# Patient Record
Sex: Male | Born: 1935 | Race: White | Hispanic: No | Smoking: Never smoker
Health system: Southern US, Community
[De-identification: ages and names within clinical notes are randomized; demographics above are authoritative.]

## PROBLEM LIST (undated history)

## (undated) DIAGNOSIS — R001 Bradycardia, unspecified: Secondary | ICD-10-CM

## (undated) DIAGNOSIS — I10 Essential (primary) hypertension: Secondary | ICD-10-CM

## (undated) DIAGNOSIS — Z9889 Other specified postprocedural states: Secondary | ICD-10-CM

## (undated) DIAGNOSIS — K279 Peptic ulcer, site unspecified, unspecified as acute or chronic, without hemorrhage or perforation: Secondary | ICD-10-CM

## (undated) DIAGNOSIS — M1990 Unspecified osteoarthritis, unspecified site: Secondary | ICD-10-CM

## (undated) DIAGNOSIS — I509 Heart failure, unspecified: Secondary | ICD-10-CM

## (undated) DIAGNOSIS — N400 Enlarged prostate without lower urinary tract symptoms: Secondary | ICD-10-CM

## (undated) DIAGNOSIS — E669 Obesity, unspecified: Secondary | ICD-10-CM

## (undated) DIAGNOSIS — D649 Anemia, unspecified: Secondary | ICD-10-CM

## (undated) DIAGNOSIS — E119 Type 2 diabetes mellitus without complications: Secondary | ICD-10-CM

## (undated) DIAGNOSIS — K219 Gastro-esophageal reflux disease without esophagitis: Secondary | ICD-10-CM

## (undated) DIAGNOSIS — Z95810 Presence of automatic (implantable) cardiac defibrillator: Secondary | ICD-10-CM

## (undated) DIAGNOSIS — I4891 Unspecified atrial fibrillation: Secondary | ICD-10-CM

## (undated) DIAGNOSIS — E785 Hyperlipidemia, unspecified: Secondary | ICD-10-CM

## (undated) HISTORY — DX: Anemia, unspecified: D64.9

## (undated) HISTORY — DX: Other specified postprocedural states: Z98.890

## (undated) HISTORY — DX: Obesity, unspecified: E66.9

## (undated) HISTORY — DX: Heart failure, unspecified: I50.9

## (undated) HISTORY — DX: Bradycardia, unspecified: R00.1

## (undated) HISTORY — DX: Unspecified atrial fibrillation: I48.91

## (undated) HISTORY — PX: EP IMPLANTABLE DEVICE: SHX172B

## (undated) HISTORY — DX: Type 2 diabetes mellitus without complications: E11.9

## (undated) HISTORY — DX: Essential (primary) hypertension: I10

## (undated) HISTORY — DX: Unspecified osteoarthritis, unspecified site: M19.90

## (undated) HISTORY — DX: Peptic ulcer, site unspecified, unspecified as acute or chronic, without hemorrhage or perforation: K27.9

## (undated) HISTORY — DX: Benign prostatic hyperplasia without lower urinary tract symptoms: N40.0

## (undated) HISTORY — DX: Hyperlipidemia, unspecified: E78.5

---

## 1963-12-11 HISTORY — PX: OTHER SURGICAL HISTORY: SHX169

## 1995-12-11 HISTORY — PX: TOTAL KNEE ARTHROPLASTY: SHX125

## 2000-02-08 HISTORY — PX: OTHER SURGICAL HISTORY: SHX169

## 2000-02-14 ENCOUNTER — Encounter: Payer: Self-pay | Admitting: Orthopedic Surgery

## 2000-02-21 ENCOUNTER — Inpatient Hospital Stay (HOSPITAL_COMMUNITY): Admission: RE | Admit: 2000-02-21 | Discharge: 2000-02-28 | Payer: Self-pay | Admitting: Orthopedic Surgery

## 2000-02-24 ENCOUNTER — Encounter: Payer: Self-pay | Admitting: Specialist

## 2002-06-24 HISTORY — PX: ESOPHAGOGASTRODUODENOSCOPY: SHX1529

## 2004-10-10 ENCOUNTER — Ambulatory Visit: Payer: Self-pay | Admitting: Cardiology

## 2004-10-13 ENCOUNTER — Ambulatory Visit: Payer: Self-pay | Admitting: Cardiology

## 2004-10-18 ENCOUNTER — Ambulatory Visit: Payer: Self-pay | Admitting: Gastroenterology

## 2004-10-20 ENCOUNTER — Ambulatory Visit: Payer: Self-pay

## 2004-11-16 ENCOUNTER — Ambulatory Visit: Payer: Self-pay | Admitting: Endocrinology

## 2004-12-07 ENCOUNTER — Ambulatory Visit: Payer: Self-pay | Admitting: Cardiology

## 2004-12-12 ENCOUNTER — Ambulatory Visit: Payer: Self-pay | Admitting: Internal Medicine

## 2004-12-12 ENCOUNTER — Inpatient Hospital Stay (HOSPITAL_BASED_OUTPATIENT_CLINIC_OR_DEPARTMENT_OTHER): Admission: RE | Admit: 2004-12-12 | Discharge: 2004-12-12 | Payer: Self-pay | Admitting: Internal Medicine

## 2004-12-15 ENCOUNTER — Ambulatory Visit: Payer: Self-pay

## 2004-12-21 ENCOUNTER — Ambulatory Visit: Payer: Self-pay | Admitting: Internal Medicine

## 2005-01-04 ENCOUNTER — Ambulatory Visit: Payer: Self-pay | Admitting: Internal Medicine

## 2005-01-12 ENCOUNTER — Ambulatory Visit: Payer: Self-pay

## 2005-01-18 ENCOUNTER — Ambulatory Visit: Payer: Self-pay | Admitting: Cardiology

## 2005-03-05 ENCOUNTER — Ambulatory Visit: Payer: Self-pay | Admitting: Cardiology

## 2005-10-03 ENCOUNTER — Ambulatory Visit: Payer: Self-pay | Admitting: Endocrinology

## 2005-10-19 ENCOUNTER — Ambulatory Visit: Payer: Self-pay | Admitting: Endocrinology

## 2005-10-25 ENCOUNTER — Ambulatory Visit: Payer: Self-pay | Admitting: Endocrinology

## 2006-10-24 ENCOUNTER — Ambulatory Visit: Payer: Self-pay | Admitting: Internal Medicine

## 2007-01-16 ENCOUNTER — Ambulatory Visit: Payer: Self-pay | Admitting: Endocrinology

## 2007-01-16 LAB — CONVERTED CEMR LAB
ALT: 26 units/L (ref 0–40)
AST: 28 units/L (ref 0–37)
Albumin: 3.8 g/dL (ref 3.5–5.2)
Alkaline Phosphatase: 58 units/L (ref 39–117)
BUN: 25 mg/dL — ABNORMAL HIGH (ref 6–23)
Basophils Absolute: 0 10*3/uL (ref 0.0–0.1)
Basophils Relative: 0.3 % (ref 0.0–1.0)
Bilirubin Urine: NEGATIVE
Bilirubin, Direct: 0.2 mg/dL (ref 0.0–0.3)
CO2: 30 meq/L (ref 19–32)
Calcium: 9.2 mg/dL (ref 8.4–10.5)
Chloride: 102 meq/L (ref 96–112)
Cholesterol: 123 mg/dL (ref 0–200)
Creatinine, Ser: 1 mg/dL (ref 0.4–1.5)
Creatinine,U: 127.9 mg/dL
Crystals: NEGATIVE
Eosinophils Absolute: 0.2 10*3/uL (ref 0.0–0.6)
Eosinophils Relative: 3.6 % (ref 0.0–5.0)
GFR calc Af Amer: 94 mL/min
GFR calc non Af Amer: 77 mL/min
Glucose, Bld: 218 mg/dL — ABNORMAL HIGH (ref 70–99)
HCT: 39.8 % (ref 39.0–52.0)
HDL: 57.5 mg/dL (ref >39.0)
Hemoglobin, Urine: NEGATIVE
Hemoglobin: 13.7 g/dL (ref 13.0–17.0)
Hgb A1c MFr Bld: 6.3 % — ABNORMAL HIGH (ref 4.6–6.0)
Ketones, ur: NEGATIVE mg/dL
LDL Cholesterol: 55 mg/dL (ref 0–99)
Leukocytes, UA: NEGATIVE
Lymphocytes Relative: 23.6 % (ref 12.0–46.0)
MCHC: 34.5 g/dL (ref 30.0–36.0)
MCV: 88.3 fL (ref 78.0–100.0)
Microalb Creat Ratio: 5.5 mg/g (ref 0.0–30.0)
Microalb, Ur: 0.7 mg/dL (ref 0.0–1.9)
Monocytes Absolute: 0.5 10*3/uL (ref 0.2–0.7)
Monocytes Relative: 8.1 % (ref 3.0–11.0)
Neutro Abs: 4.1 10*3/uL (ref 1.4–7.7)
Neutrophils Relative %: 64.4 % (ref 43.0–77.0)
Nitrite: NEGATIVE
PSA: 1.12 ng/mL (ref 0.10–4.00)
Platelets: 176 10*3/uL (ref 150–400)
Potassium: 2.7 meq/L — CL (ref 3.5–5.1)
RBC: 4.51 M/uL (ref 4.22–5.81)
RDW: 13.8 % (ref 11.5–14.6)
Sodium: 138 meq/L (ref 135–145)
Specific Gravity, Urine: 1.015 (ref 1.000–1.03)
Squamous Epithelial / HPF: NEGATIVE /lpf
TSH: 1.48 microintl units/mL (ref 0.35–5.50)
Total Bilirubin: 0.6 mg/dL (ref 0.3–1.2)
Total CHOL/HDL Ratio: 2.1
Total Protein, Urine: NEGATIVE mg/dL
Total Protein: 6.5 g/dL (ref 6.0–8.3)
Triglycerides: 53 mg/dL (ref 0–149)
Urine Glucose: NEGATIVE mg/dL
Urobilinogen, UA: 0.2 (ref 0.0–1.0)
VLDL: 11 mg/dL (ref 0–40)
WBC: 6.3 10*3/uL (ref 4.5–10.5)
pH: 6.5 (ref 5.0–8.0)

## 2007-02-14 ENCOUNTER — Ambulatory Visit: Payer: Self-pay | Admitting: Endocrinology

## 2007-02-14 LAB — CONVERTED CEMR LAB
BUN: 26 mg/dL — ABNORMAL HIGH (ref 6–23)
CO2: 31 meq/L (ref 19–32)
Calcium: 9.4 mg/dL (ref 8.4–10.5)
Chloride: 98 meq/L (ref 96–112)
Creatinine, Ser: 0.9 mg/dL (ref 0.4–1.5)
Creatinine,U: 100.2 mg/dL
GFR calc Af Amer: 106 mL/min
GFR calc non Af Amer: 87 mL/min
Glucose, Bld: 120 mg/dL — ABNORMAL HIGH (ref 70–99)
Hgb A1c MFr Bld: 6.6 % — ABNORMAL HIGH (ref 4.6–6.0)
Microalb Creat Ratio: 4 mg/g (ref 0.0–30.0)
Microalb, Ur: 0.4 mg/dL (ref 0.0–1.9)
Potassium: 3.2 meq/L — ABNORMAL LOW (ref 3.5–5.1)
Sodium: 137 meq/L (ref 135–145)

## 2007-05-19 ENCOUNTER — Ambulatory Visit: Payer: Self-pay | Admitting: Endocrinology

## 2007-05-19 LAB — CONVERTED CEMR LAB
BUN: 26 mg/dL — ABNORMAL HIGH (ref 6–23)
CO2: 31 meq/L (ref 19–32)
Calcium: 9 mg/dL (ref 8.4–10.5)
Chloride: 106 meq/L (ref 96–112)
Creatinine, Ser: 0.9 mg/dL (ref 0.4–1.5)
GFR calc Af Amer: 106 mL/min
GFR calc non Af Amer: 87 mL/min
Glucose, Bld: 138 mg/dL — ABNORMAL HIGH (ref 70–99)
Hgb A1c MFr Bld: 6.6 % — ABNORMAL HIGH (ref 4.6–6.0)
Potassium: 3.2 meq/L — ABNORMAL LOW (ref 3.5–5.1)
Sodium: 143 meq/L (ref 135–145)

## 2007-08-09 ENCOUNTER — Encounter: Payer: Self-pay | Admitting: Endocrinology

## 2007-08-09 DIAGNOSIS — I10 Essential (primary) hypertension: Secondary | ICD-10-CM | POA: Insufficient documentation

## 2007-08-09 DIAGNOSIS — I509 Heart failure, unspecified: Secondary | ICD-10-CM | POA: Insufficient documentation

## 2007-08-09 DIAGNOSIS — D509 Iron deficiency anemia, unspecified: Secondary | ICD-10-CM

## 2007-08-09 DIAGNOSIS — I252 Old myocardial infarction: Secondary | ICD-10-CM | POA: Insufficient documentation

## 2007-09-18 ENCOUNTER — Ambulatory Visit: Payer: Self-pay | Admitting: Endocrinology

## 2007-09-18 LAB — CONVERTED CEMR LAB
BUN: 19 mg/dL (ref 6–23)
CO2: 34 meq/L — ABNORMAL HIGH (ref 19–32)
Calcium: 9.6 mg/dL (ref 8.4–10.5)
Chloride: 100 meq/L (ref 96–112)
Creatinine, Ser: 1 mg/dL (ref 0.4–1.5)
GFR calc Af Amer: 93 mL/min
GFR calc non Af Amer: 77 mL/min
Glucose, Bld: 96 mg/dL (ref 70–99)
Hgb A1c MFr Bld: 6.3 % — ABNORMAL HIGH (ref 4.6–6.0)
Potassium: 3.7 meq/L (ref 3.5–5.1)
Sodium: 140 meq/L (ref 135–145)

## 2008-03-09 ENCOUNTER — Encounter: Payer: Self-pay | Admitting: Endocrinology

## 2008-03-16 ENCOUNTER — Ambulatory Visit: Payer: Self-pay | Admitting: Endocrinology

## 2008-03-16 DIAGNOSIS — E1121 Type 2 diabetes mellitus with diabetic nephropathy: Secondary | ICD-10-CM | POA: Insufficient documentation

## 2008-03-16 DIAGNOSIS — I4891 Unspecified atrial fibrillation: Secondary | ICD-10-CM | POA: Insufficient documentation

## 2008-04-06 ENCOUNTER — Ambulatory Visit: Payer: Self-pay

## 2008-04-06 ENCOUNTER — Ambulatory Visit: Payer: Self-pay | Admitting: Cardiology

## 2008-04-13 ENCOUNTER — Encounter: Payer: Self-pay | Admitting: Cardiology

## 2008-04-13 ENCOUNTER — Ambulatory Visit: Payer: Self-pay | Admitting: Cardiology

## 2008-04-13 ENCOUNTER — Ambulatory Visit: Payer: Self-pay

## 2008-04-20 ENCOUNTER — Ambulatory Visit: Payer: Self-pay | Admitting: Cardiovascular Disease

## 2008-04-28 ENCOUNTER — Ambulatory Visit: Payer: Self-pay | Admitting: Cardiology

## 2008-05-04 ENCOUNTER — Encounter: Payer: Self-pay | Admitting: Endocrinology

## 2008-05-04 ENCOUNTER — Ambulatory Visit: Payer: Self-pay

## 2008-05-05 ENCOUNTER — Ambulatory Visit: Payer: Self-pay | Admitting: Cardiology

## 2008-05-17 ENCOUNTER — Ambulatory Visit: Payer: Self-pay | Admitting: Cardiology

## 2008-05-20 ENCOUNTER — Ambulatory Visit: Payer: Self-pay | Admitting: Cardiology

## 2008-06-10 ENCOUNTER — Ambulatory Visit: Payer: Self-pay | Admitting: Cardiology

## 2008-06-17 ENCOUNTER — Ambulatory Visit: Payer: Self-pay | Admitting: Cardiology

## 2008-07-01 ENCOUNTER — Ambulatory Visit: Payer: Self-pay | Admitting: Internal Medicine

## 2008-07-01 ENCOUNTER — Telehealth: Payer: Self-pay | Admitting: Endocrinology

## 2008-07-29 ENCOUNTER — Ambulatory Visit: Payer: Self-pay | Admitting: Internal Medicine

## 2008-08-11 ENCOUNTER — Ambulatory Visit: Payer: Self-pay | Admitting: Cardiology

## 2008-08-11 LAB — CONVERTED CEMR LAB
BUN: 35 mg/dL — ABNORMAL HIGH (ref 6–23)
CO2: 33 meq/L — ABNORMAL HIGH (ref 19–32)
Calcium: 9 mg/dL (ref 8.4–10.5)
Chloride: 104 meq/L (ref 96–112)
Creatinine, Ser: 1.1 mg/dL (ref 0.4–1.5)
GFR calc Af Amer: 83 mL/min
GFR calc non Af Amer: 69 mL/min
Glucose, Bld: 94 mg/dL (ref 70–99)
Potassium: 3.1 meq/L — ABNORMAL LOW (ref 3.5–5.1)
Pro B Natriuretic peptide (BNP): 62 pg/mL (ref 0.0–100.0)
Sodium: 140 meq/L (ref 135–145)

## 2008-08-19 ENCOUNTER — Ambulatory Visit: Payer: Self-pay | Admitting: Cardiology

## 2008-08-19 LAB — CONVERTED CEMR LAB
BUN: 21 mg/dL (ref 6–23)
CO2: 29 meq/L (ref 19–32)
Calcium: 9.2 mg/dL (ref 8.4–10.5)
Chloride: 103 meq/L (ref 96–112)
Creatinine, Ser: 0.8 mg/dL (ref 0.4–1.5)
GFR calc Af Amer: 121 mL/min
GFR calc non Af Amer: 100 mL/min
Glucose, Bld: 103 mg/dL — ABNORMAL HIGH (ref 70–99)
Potassium: 3.7 meq/L (ref 3.5–5.1)
Sodium: 139 meq/L (ref 135–145)

## 2008-08-26 ENCOUNTER — Ambulatory Visit: Payer: Self-pay | Admitting: Cardiology

## 2008-09-08 ENCOUNTER — Ambulatory Visit: Payer: Self-pay | Admitting: Endocrinology

## 2008-09-23 ENCOUNTER — Ambulatory Visit: Payer: Self-pay | Admitting: Cardiology

## 2008-10-14 ENCOUNTER — Telehealth: Payer: Self-pay | Admitting: Endocrinology

## 2008-10-14 ENCOUNTER — Ambulatory Visit: Payer: Self-pay | Admitting: Cardiovascular Disease

## 2008-11-11 ENCOUNTER — Ambulatory Visit: Payer: Self-pay | Admitting: Internal Medicine

## 2008-12-06 ENCOUNTER — Ambulatory Visit: Payer: Self-pay | Admitting: Cardiovascular Disease

## 2008-12-31 ENCOUNTER — Ambulatory Visit: Payer: Self-pay | Admitting: Internal Medicine

## 2009-01-28 ENCOUNTER — Ambulatory Visit: Payer: Self-pay | Admitting: Cardiovascular Disease

## 2009-02-08 ENCOUNTER — Telehealth: Payer: Self-pay | Admitting: Endocrinology

## 2009-02-21 DIAGNOSIS — E785 Hyperlipidemia, unspecified: Secondary | ICD-10-CM | POA: Insufficient documentation

## 2009-02-21 DIAGNOSIS — K275 Chronic or unspecified peptic ulcer, site unspecified, with perforation: Secondary | ICD-10-CM

## 2009-02-21 DIAGNOSIS — I498 Other specified cardiac arrhythmias: Secondary | ICD-10-CM

## 2009-02-21 DIAGNOSIS — R609 Edema, unspecified: Secondary | ICD-10-CM

## 2009-02-22 ENCOUNTER — Ambulatory Visit: Payer: Self-pay | Admitting: Cardiology

## 2009-02-22 ENCOUNTER — Encounter: Payer: Self-pay | Admitting: Cardiology

## 2009-02-22 LAB — CONVERTED CEMR LAB
BUN: 36 mg/dL — ABNORMAL HIGH (ref 6–23)
CO2: 32 meq/L (ref 19–32)
Calcium: 9.2 mg/dL (ref 8.4–10.5)
Chloride: 106 meq/L (ref 96–112)
Creatinine, Ser: 0.9 mg/dL (ref 0.4–1.5)
GFR calc non Af Amer: 86.74 mL/min (ref >60)
Glucose, Bld: 113 mg/dL — ABNORMAL HIGH (ref 70–99)
Potassium: 4.7 meq/L (ref 3.5–5.1)
Sodium: 142 meq/L (ref 135–145)

## 2009-02-25 ENCOUNTER — Ambulatory Visit: Payer: Self-pay | Admitting: Cardiology

## 2009-02-28 ENCOUNTER — Encounter: Payer: Self-pay | Admitting: Endocrinology

## 2009-03-09 ENCOUNTER — Ambulatory Visit: Payer: Self-pay | Admitting: Cardiology

## 2009-03-09 LAB — CONVERTED CEMR LAB
BUN: 29 mg/dL — ABNORMAL HIGH (ref 6–23)
Basophils Absolute: 0 10*3/uL (ref 0.0–0.1)
Basophils Relative: 0.5 % (ref 0.0–3.0)
CO2: 29 meq/L (ref 19–32)
Calcium: 8.9 mg/dL (ref 8.4–10.5)
Chloride: 105 meq/L (ref 96–112)
Creatinine, Ser: 0.9 mg/dL (ref 0.4–1.5)
Eosinophils Absolute: 0.4 10*3/uL (ref 0.0–0.7)
Eosinophils Relative: 5.5 % — ABNORMAL HIGH (ref 0.0–5.0)
GFR calc non Af Amer: 86.73 mL/min (ref >60)
Glucose, Bld: 90 mg/dL (ref 70–99)
HCT: 39.3 % (ref 39.0–52.0)
Hemoglobin: 13 g/dL (ref 13.0–17.0)
Lymphocytes Relative: 26.7 % (ref 12.0–46.0)
Lymphs Abs: 1.7 10*3/uL (ref 0.7–4.0)
MCHC: 33 g/dL (ref 30.0–36.0)
MCV: 92.7 fL (ref 78.0–100.0)
Monocytes Absolute: 0.8 10*3/uL (ref 0.1–1.0)
Monocytes Relative: 11.9 % (ref 3.0–12.0)
Neutro Abs: 3.6 10*3/uL (ref 1.4–7.7)
Neutrophils Relative %: 55.4 % (ref 43.0–77.0)
Platelets: 121 10*3/uL — ABNORMAL LOW (ref 150.0–400.0)
Potassium: 3.4 meq/L — ABNORMAL LOW (ref 3.5–5.1)
RBC: 4.24 M/uL (ref 4.22–5.81)
RDW: 13.2 % (ref 11.5–14.6)
Sodium: 141 meq/L (ref 135–145)
WBC: 6.5 10*3/uL (ref 4.5–10.5)

## 2009-03-24 ENCOUNTER — Ambulatory Visit: Payer: Self-pay | Admitting: Internal Medicine

## 2009-03-24 ENCOUNTER — Encounter: Payer: Self-pay | Admitting: Endocrinology

## 2009-03-31 ENCOUNTER — Ambulatory Visit: Payer: Self-pay | Admitting: Cardiology

## 2009-03-31 ENCOUNTER — Encounter: Payer: Self-pay | Admitting: Cardiology

## 2009-03-31 ENCOUNTER — Encounter: Payer: Self-pay | Admitting: Endocrinology

## 2009-03-31 ENCOUNTER — Ambulatory Visit: Payer: Self-pay | Admitting: Internal Medicine

## 2009-03-31 ENCOUNTER — Ambulatory Visit: Payer: Self-pay | Admitting: Cardiovascular Disease

## 2009-03-31 LAB — CONVERTED CEMR LAB
Calcium: 9.5 mg/dL (ref 8.4–10.5)
GFR calc non Af Amer: 76.79 mL/min (ref >60)
Potassium: 3.4 meq/L — ABNORMAL LOW (ref 3.5–5.1)
Sodium: 140 meq/L (ref 135–145)

## 2009-04-07 ENCOUNTER — Ambulatory Visit: Payer: Self-pay | Admitting: Internal Medicine

## 2009-04-07 ENCOUNTER — Ambulatory Visit: Payer: Self-pay | Admitting: Cardiology

## 2009-04-07 ENCOUNTER — Encounter: Payer: Self-pay | Admitting: Endocrinology

## 2009-04-08 LAB — CONVERTED CEMR LAB
CO2: 30 meq/L (ref 19–32)
Chloride: 106 meq/L (ref 96–112)
Potassium: 4.5 meq/L (ref 3.5–5.1)
Sodium: 141 meq/L (ref 135–145)

## 2009-04-15 ENCOUNTER — Ambulatory Visit: Payer: Self-pay

## 2009-04-15 ENCOUNTER — Encounter: Payer: Self-pay | Admitting: Cardiology

## 2009-04-21 ENCOUNTER — Encounter: Payer: Self-pay | Admitting: Endocrinology

## 2009-04-21 ENCOUNTER — Ambulatory Visit: Payer: Self-pay | Admitting: Internal Medicine

## 2009-04-29 ENCOUNTER — Ambulatory Visit: Payer: Self-pay | Admitting: Cardiology

## 2009-04-29 DIAGNOSIS — I428 Other cardiomyopathies: Secondary | ICD-10-CM

## 2009-05-02 LAB — CONVERTED CEMR LAB
Basophils Relative: 0.3 % (ref 0.0–3.0)
Eosinophils Absolute: 0.3 10*3/uL (ref 0.0–0.7)
Eosinophils Relative: 5 % (ref 0.0–5.0)
Hemoglobin: 12.4 g/dL — ABNORMAL LOW (ref 13.0–17.0)
MCHC: 33.2 g/dL (ref 30.0–36.0)
MCV: 92.6 fL (ref 78.0–100.0)
Monocytes Absolute: 0.6 10*3/uL (ref 0.1–1.0)
Neutro Abs: 3.1 10*3/uL (ref 1.4–7.7)
Neutrophils Relative %: 55.1 % (ref 43.0–77.0)
RBC: 4.03 M/uL — ABNORMAL LOW (ref 4.22–5.81)
WBC: 5.6 10*3/uL (ref 4.5–10.5)

## 2009-05-11 ENCOUNTER — Encounter: Payer: Self-pay | Admitting: *Deleted

## 2009-05-25 ENCOUNTER — Encounter: Payer: Self-pay | Admitting: Endocrinology

## 2009-05-25 ENCOUNTER — Ambulatory Visit: Payer: Self-pay | Admitting: Internal Medicine

## 2009-05-26 ENCOUNTER — Encounter: Payer: Self-pay | Admitting: Internal Medicine

## 2009-06-14 ENCOUNTER — Telehealth: Payer: Self-pay | Admitting: Endocrinology

## 2009-06-15 ENCOUNTER — Encounter: Payer: Self-pay | Admitting: *Deleted

## 2009-06-20 ENCOUNTER — Ambulatory Visit: Payer: Self-pay | Admitting: Endocrinology

## 2009-06-20 ENCOUNTER — Telehealth: Payer: Self-pay | Admitting: Endocrinology

## 2009-06-20 DIAGNOSIS — E790 Hyperuricemia without signs of inflammatory arthritis and tophaceous disease: Secondary | ICD-10-CM | POA: Insufficient documentation

## 2009-06-20 DIAGNOSIS — D696 Thrombocytopenia, unspecified: Secondary | ICD-10-CM

## 2009-06-21 LAB — CONVERTED CEMR LAB
Basophils Relative: 1.1 % (ref 0.0–3.0)
Bilirubin Urine: NEGATIVE
CO2: 33 meq/L — ABNORMAL HIGH (ref 19–32)
Calcium: 9 mg/dL (ref 8.4–10.5)
Chloride: 103 meq/L (ref 96–112)
Creatinine, Ser: 0.8 mg/dL (ref 0.4–1.5)
Eosinophils Relative: 3.7 % (ref 0.0–5.0)
Folate: 8.6 ng/mL
HDL: 57.4 mg/dL (ref >39.00)
Hemoglobin, Urine: NEGATIVE
Hemoglobin: 13.7 g/dL (ref 13.0–17.0)
Iron: 58 ug/dL (ref 42–165)
Ketones, ur: NEGATIVE mg/dL
LDL Cholesterol: 57 mg/dL (ref 0–99)
Lymphocytes Relative: 26.7 % (ref 12.0–46.0)
Monocytes Relative: 11 % (ref 3.0–12.0)
Neutro Abs: 3.9 10*3/uL (ref 1.4–7.7)
Neutrophils Relative %: 57.5 % (ref 43.0–77.0)
PSA: 1.12 ng/mL (ref 0.10–4.00)
RBC: 4.32 M/uL (ref 4.22–5.81)
Saturation Ratios: 19.7 % — ABNORMAL LOW (ref 20.0–50.0)
Sodium: 143 meq/L (ref 135–145)
Specific Gravity, Urine: 1.015 (ref 1.000–1.030)
Total CHOL/HDL Ratio: 2
Transferrin: 210.8 mg/dL — ABNORMAL LOW (ref 212.0–360.0)
Triglycerides: 68 mg/dL (ref 0.0–149.0)
Urine Glucose: NEGATIVE mg/dL
Urobilinogen, UA: 0.2 (ref 0.0–1.0)
Vitamin B-12: 301 pg/mL (ref 211–911)
WBC: 6.7 10*3/uL (ref 4.5–10.5)

## 2009-06-22 ENCOUNTER — Encounter: Payer: Self-pay | Admitting: Endocrinology

## 2009-06-22 ENCOUNTER — Ambulatory Visit: Payer: Self-pay | Admitting: Internal Medicine

## 2009-07-26 ENCOUNTER — Encounter: Payer: Self-pay | Admitting: Endocrinology

## 2009-07-26 ENCOUNTER — Ambulatory Visit: Payer: Self-pay | Admitting: Cardiology

## 2009-07-26 ENCOUNTER — Ambulatory Visit: Payer: Self-pay | Admitting: Internal Medicine

## 2009-08-16 ENCOUNTER — Encounter: Payer: Self-pay | Admitting: Cardiology

## 2009-08-16 ENCOUNTER — Encounter: Payer: Self-pay | Admitting: Endocrinology

## 2009-08-17 ENCOUNTER — Ambulatory Visit: Payer: Self-pay | Admitting: Internal Medicine

## 2009-09-14 ENCOUNTER — Ambulatory Visit: Payer: Self-pay | Admitting: Internal Medicine

## 2009-09-14 ENCOUNTER — Encounter: Payer: Self-pay | Admitting: Cardiology

## 2009-10-12 ENCOUNTER — Encounter: Payer: Self-pay | Admitting: Cardiology

## 2009-10-19 ENCOUNTER — Ambulatory Visit: Payer: Self-pay | Admitting: Internal Medicine

## 2009-11-01 ENCOUNTER — Encounter (INDEPENDENT_AMBULATORY_CARE_PROVIDER_SITE_OTHER): Payer: Self-pay | Admitting: *Deleted

## 2009-11-14 ENCOUNTER — Encounter: Payer: Self-pay | Admitting: Cardiology

## 2009-11-14 ENCOUNTER — Encounter: Payer: Self-pay | Admitting: Endocrinology

## 2009-11-14 ENCOUNTER — Ambulatory Visit: Payer: Self-pay | Admitting: Cardiology

## 2009-12-12 ENCOUNTER — Ambulatory Visit: Payer: Self-pay | Admitting: Internal Medicine

## 2009-12-12 ENCOUNTER — Encounter: Payer: Self-pay | Admitting: Cardiology

## 2009-12-27 ENCOUNTER — Ambulatory Visit: Payer: Self-pay | Admitting: Cardiology

## 2009-12-29 ENCOUNTER — Telehealth (INDEPENDENT_AMBULATORY_CARE_PROVIDER_SITE_OTHER): Payer: Self-pay | Admitting: *Deleted

## 2010-01-10 ENCOUNTER — Ambulatory Visit: Payer: Self-pay | Admitting: Internal Medicine

## 2010-01-10 ENCOUNTER — Ambulatory Visit: Payer: Self-pay | Admitting: Cardiology

## 2010-01-23 ENCOUNTER — Ambulatory Visit: Payer: Self-pay | Admitting: Cardiology

## 2010-02-09 ENCOUNTER — Encounter: Payer: Self-pay | Admitting: Cardiovascular Disease

## 2010-02-09 ENCOUNTER — Ambulatory Visit: Payer: Self-pay | Admitting: Internal Medicine

## 2010-02-23 ENCOUNTER — Encounter: Payer: Self-pay | Admitting: Cardiology

## 2010-02-23 ENCOUNTER — Encounter: Payer: Self-pay | Admitting: Endocrinology

## 2010-03-13 ENCOUNTER — Encounter: Payer: Self-pay | Admitting: Cardiology

## 2010-03-14 ENCOUNTER — Ambulatory Visit: Payer: Self-pay | Admitting: Internal Medicine

## 2010-03-16 ENCOUNTER — Encounter (INDEPENDENT_AMBULATORY_CARE_PROVIDER_SITE_OTHER): Payer: Self-pay | Admitting: *Deleted

## 2010-03-16 ENCOUNTER — Ambulatory Visit: Payer: Self-pay | Admitting: Cardiology

## 2010-03-16 LAB — CONVERTED CEMR LAB
ALT: 19 units/L (ref 0–53)
AST: 25 units/L (ref 0–37)
Albumin: 3.8 g/dL (ref 3.5–5.2)
HDL: 60 mg/dL (ref >39.00)
Total CHOL/HDL Ratio: 3
Triglycerides: 51 mg/dL (ref 0.0–149.0)
VLDL: 10.2 mg/dL (ref 0.0–40.0)

## 2010-04-07 ENCOUNTER — Telehealth: Payer: Self-pay | Admitting: Endocrinology

## 2010-04-10 ENCOUNTER — Ambulatory Visit: Payer: Self-pay | Admitting: Internal Medicine

## 2010-05-10 ENCOUNTER — Ambulatory Visit: Payer: Self-pay | Admitting: Internal Medicine

## 2010-06-13 ENCOUNTER — Ambulatory Visit: Payer: Self-pay | Admitting: Internal Medicine

## 2010-06-13 ENCOUNTER — Encounter: Payer: Self-pay | Admitting: Cardiology

## 2010-07-11 ENCOUNTER — Ambulatory Visit: Payer: Self-pay | Admitting: Internal Medicine

## 2010-07-31 ENCOUNTER — Ambulatory Visit: Payer: Self-pay | Admitting: Endocrinology

## 2010-07-31 DIAGNOSIS — E876 Hypokalemia: Secondary | ICD-10-CM

## 2010-08-02 LAB — CONVERTED CEMR LAB
ALT: 18 units/L (ref 0–53)
BUN: 31 mg/dL — ABNORMAL HIGH (ref 6–23)
Basophils Absolute: 0 10*3/uL (ref 0.0–0.1)
Bilirubin Urine: NEGATIVE
Chloride: 92 meq/L — ABNORMAL LOW (ref 96–112)
Cholesterol: 191 mg/dL (ref 0–200)
Glucose, Bld: 150 mg/dL — ABNORMAL HIGH (ref 70–99)
HCT: 43.7 % (ref 39.0–52.0)
Hemoglobin, Urine: NEGATIVE
Lymphs Abs: 1.5 10*3/uL (ref 0.7–4.0)
MCHC: 34.5 g/dL (ref 30.0–36.0)
MCV: 92.1 fL (ref 78.0–100.0)
Monocytes Absolute: 0.9 10*3/uL (ref 0.1–1.0)
Nitrite: NEGATIVE
PSA: 1.22 ng/mL (ref 0.10–4.00)
Platelets: 144 10*3/uL — ABNORMAL LOW (ref 150.0–400.0)
Potassium: 2.2 meq/L — CL (ref 3.5–5.1)
RDW: 15.4 % — ABNORMAL HIGH (ref 11.5–14.6)
TSH: 1.89 microintl units/mL (ref 0.35–5.50)
Total Bilirubin: 1.3 mg/dL — ABNORMAL HIGH (ref 0.3–1.2)
Total Protein, Urine: NEGATIVE mg/dL
Uric Acid, Serum: 10 mg/dL — ABNORMAL HIGH (ref 4.0–7.8)
VLDL: 14.6 mg/dL (ref 0.0–40.0)

## 2010-08-07 ENCOUNTER — Ambulatory Visit: Payer: Self-pay | Admitting: Cardiology

## 2010-08-08 ENCOUNTER — Ambulatory Visit: Payer: Self-pay | Admitting: Cardiology

## 2010-08-08 ENCOUNTER — Ambulatory Visit: Payer: Self-pay | Admitting: Internal Medicine

## 2010-08-10 ENCOUNTER — Telehealth (INDEPENDENT_AMBULATORY_CARE_PROVIDER_SITE_OTHER): Payer: Self-pay | Admitting: *Deleted

## 2010-08-30 ENCOUNTER — Ambulatory Visit: Payer: Self-pay | Admitting: Endocrinology

## 2010-08-30 DIAGNOSIS — R413 Other amnesia: Secondary | ICD-10-CM | POA: Insufficient documentation

## 2010-08-30 LAB — CONVERTED CEMR LAB
BUN: 35 mg/dL — ABNORMAL HIGH (ref 6–23)
CO2: 29 meq/L (ref 19–32)
Calcium: 9.3 mg/dL (ref 8.4–10.5)
Creatinine, Ser: 0.9 mg/dL (ref 0.4–1.5)
Glucose, Bld: 111 mg/dL — ABNORMAL HIGH (ref 70–99)

## 2010-08-31 ENCOUNTER — Telehealth: Payer: Self-pay | Admitting: Endocrinology

## 2010-09-07 ENCOUNTER — Ambulatory Visit: Payer: Self-pay | Admitting: Internal Medicine

## 2010-09-07 ENCOUNTER — Encounter: Payer: Self-pay | Admitting: Cardiology

## 2010-10-09 ENCOUNTER — Ambulatory Visit: Payer: Self-pay | Admitting: Internal Medicine

## 2010-10-18 ENCOUNTER — Ambulatory Visit: Payer: Self-pay | Admitting: Endocrinology

## 2010-10-18 DIAGNOSIS — E1129 Type 2 diabetes mellitus with other diabetic kidney complication: Secondary | ICD-10-CM

## 2010-10-18 LAB — CONVERTED CEMR LAB
BUN: 23 mg/dL (ref 6–23)
Calcium: 9.4 mg/dL (ref 8.4–10.5)
Creatinine, Ser: 0.8 mg/dL (ref 0.4–1.5)
GFR calc non Af Amer: 98.94 mL/min (ref >60)
Potassium: 4.1 meq/L (ref 3.5–5.1)
Uric Acid, Serum: 6.7 mg/dL (ref 4.0–7.8)

## 2010-10-30 ENCOUNTER — Ambulatory Visit: Payer: Self-pay | Admitting: Internal Medicine

## 2010-11-06 ENCOUNTER — Ambulatory Visit: Payer: Self-pay | Admitting: Internal Medicine

## 2010-12-06 ENCOUNTER — Ambulatory Visit: Payer: Self-pay | Admitting: Internal Medicine

## 2010-12-06 ENCOUNTER — Encounter: Payer: Self-pay | Admitting: Cardiology

## 2010-12-21 ENCOUNTER — Ambulatory Visit
Admission: RE | Admit: 2010-12-21 | Discharge: 2010-12-21 | Payer: Self-pay | Source: Home / Self Care | Attending: Internal Medicine | Admitting: Internal Medicine

## 2011-01-07 LAB — CONVERTED CEMR LAB
ALT: 33 units/L (ref 0–53)
AST: 25 units/L (ref 0–37)
Albumin: 3.9 g/dL (ref 3.5–5.2)
Alkaline Phosphatase: 66 units/L (ref 39–117)
BUN: 16 mg/dL (ref 6–23)
BUN: 21 mg/dL (ref 6–23)
Basophils Absolute: 0 10*3/uL (ref 0.0–0.1)
Basophils Relative: 0.4 % (ref 0.0–1.0)
Bilirubin, Direct: 0.2 mg/dL (ref 0.0–0.3)
CO2: 30 meq/L (ref 19–32)
CO2: 32 meq/L (ref 19–32)
Calcium: 9.2 mg/dL (ref 8.4–10.5)
Calcium: 9.4 mg/dL (ref 8.4–10.5)
Chloride: 104 meq/L (ref 96–112)
Chloride: 99 meq/L (ref 96–112)
Cholesterol: 125 mg/dL (ref 0–200)
Creatinine, Ser: 0.8 mg/dL (ref 0.4–1.5)
Creatinine, Ser: 0.8 mg/dL (ref 0.4–1.5)
Creatinine,U: 29.9 mg/dL
Eosinophils Absolute: 0.1 10*3/uL (ref 0.0–0.7)
Eosinophils Relative: 1.1 % (ref 0.0–5.0)
Folate: 8.7 ng/mL
GFR calc Af Amer: 121 mL/min
GFR calc non Af Amer: 100 mL/min
Glucose, Bld: 104 mg/dL — ABNORMAL HIGH (ref 70–99)
HCT: 43.6 % (ref 39.0–52.0)
HDL: 67.6 mg/dL (ref >39.0)
Hemoglobin: 13.9 g/dL (ref 13.0–17.0)
Hgb A1c MFr Bld: 6.4 % — ABNORMAL HIGH (ref 4.6–6.0)
Iron: 52 ug/dL (ref 42–165)
LDL Cholesterol: 51 mg/dL (ref 0–99)
Lymphocytes Relative: 13.2 % (ref 12.0–46.0)
MCHC: 32 g/dL (ref 30.0–36.0)
MCV: 92 fL (ref 78.0–100.0)
Microalb Creat Ratio: 90.3 mg/g — ABNORMAL HIGH (ref 0.0–30.0)
Microalb, Ur: 2.7 mg/dL — ABNORMAL HIGH (ref 0.0–1.9)
Monocytes Absolute: 1.1 10*3/uL — ABNORMAL HIGH (ref 0.1–1.0)
Monocytes Relative: 11.3 % (ref 3.0–12.0)
Neutro Abs: 7.2 10*3/uL (ref 1.4–7.7)
Neutrophils Relative %: 74 % (ref 43.0–77.0)
PSA: 0.9 ng/mL (ref 0.10–4.00)
Platelets: 164 10*3/uL (ref 150–400)
Potassium: 3.5 meq/L (ref 3.5–5.1)
Potassium: 4.1 meq/L (ref 3.5–5.1)
RBC: 4.74 M/uL (ref 4.22–5.81)
RDW: 14.2 % (ref 11.5–14.6)
Saturation Ratios: 15.9 % — ABNORMAL LOW (ref 20.0–50.0)
Sodium: 140 meq/L (ref 135–145)
Sodium: 141 meq/L (ref 135–145)
TSH: 1.44 microintl units/mL (ref 0.35–5.50)
Total Bilirubin: 0.9 mg/dL (ref 0.3–1.2)
Total CHOL/HDL Ratio: 1.8
Total CK: 129 units/L (ref 7–232)
Total CK: 158 units/L (ref 7–232)
Total Protein: 6.9 g/dL (ref 6.0–8.3)
Transferrin: 233.5 mg/dL (ref >=212.0)
Triglycerides: 34 mg/dL (ref 0–149)
VLDL: 7 mg/dL (ref 0–40)
Vitamin B-12: 305 pg/mL (ref 211–911)
WBC: 9.7 10*3/uL (ref 4.5–10.5)

## 2011-01-08 ENCOUNTER — Ambulatory Visit
Admission: RE | Admit: 2011-01-08 | Discharge: 2011-01-08 | Payer: Self-pay | Source: Home / Self Care | Attending: Internal Medicine | Admitting: Internal Medicine

## 2011-01-09 NOTE — Progress Notes (Signed)
  Phone Note Refill Request Message from:  Fax from Pharmacy on April 07, 2010 9:38 AM  Refills Requested: Medication #1:  KLOR-CON M20 20 MEQ  TBCR TAKE 4 by mouth two times a day QD   Dosage confirmed as above?Dosage Confirmed Initial call taken by: Josph Macho RMA,  April 07, 2010 9:38 AM    Prescriptions: KLOR-CON M20 20 MEQ  TBCR (POTASSIUM CHLORIDE CRYS CR) TAKE 4 by mouth two times a day QD  #240.0 Each x 1   Entered by:   Josph Macho RMA   Authorized by:   Minus Breeding MD   Signed by:   Josph Macho RMA on 04/07/2010   Method used:   Electronically to        [facility] [address] #[id number]* (retail)       [address] [place], [place]  [address]       Ph: [phone]       Fax: [phone]   RxID:   [id number]

## 2011-01-09 NOTE — Progress Notes (Signed)
Summary: Rx clarification  Phone Note From Pharmacy   Caller: [facility] [address] #[id number]* Summary of Call: Pharmacy called requesting clarification on pt's Rx for Potassium. Pharmacy is unclear if MD wanted liquid, capsule and the specific dose. Please advise. Initial call taken by: Margaret Pyle, CMA,  August 31, 2010 9:06 AM  Follow-up for Phone Call        20% liquid.  30 meq three times a day.  i realize this is a high dosage. Follow-up by: Minus Breeding MD,  August 31, 2010 9:09 AM  Additional Follow-up for Phone Call Additional follow up Details #1::        Pharmacist [facility] informed Additional Follow-up by: Margaret Pyle, CMA,  August 31, 2010 9:37 AM

## 2011-01-09 NOTE — Letter (Signed)
Summary: Custom - Lipid  [facility], Main Office  [address]   [place], [place] [address]   Phone: [phone]  Fax: [phone]     March 16, 2010 MRN: [id number]   [name] [address] [place], [place]  [address]   Dear [name],  We have reviewed your cholesterol results.  They are as follows:     Total Cholesterol:    160 (Desirable: less than 200)       HDL  Cholesterol:     60.00  (Desirable: greater than 40 for men and 50 for women)       LDL Cholesterol:       90  (Desirable: less than 100 for low risk and less than 70 for moderate to high risk)       Triglycerides:       51.0  (Desirable: less than 150)  Our recommendations include:These numbers look good. Continue on the same medicine. Liver function is normal. Take care, Dr. Darel Hong.    Call our office at the number listed above if you have any questions.  Lowering your LDL cholesterol is important, but it is only one of a large number of "risk factors" that may indicate that you are at risk for heart disease, stroke or other complications of hardening of the arteries.  Other risk factors include:   A.  Cigarette Smoking* B.  High Blood Pressure* C.  Obesity* D.   Low HDL Cholesterol (see yours above)* E.   Diabetes Mellitus (higher risk if your is uncontrolled) F.  Family history of premature heart disease G.  Previous history of stroke or cardiovascular disease    *These are risk factors YOU HAVE CONTROL OVER.  For more information, visit .  There is now evidence that lowering the TOTAL CHOLESTEROL AND LDL CHOLESTEROL can reduce the risk of heart disease.  The American Heart Association recommends the following guidelines for the treatment of elevated cholesterol:  1.  If there is now current heart disease and less than two risk factors, TOTAL CHOLESTEROL should be less than 200 and LDL CHOLESTEROL should be less than 100. 2.  If there is current heart disease or two  or more risk factors, TOTAL CHOLESTEROL should be less than 200 and LDL CHOLESTEROL should be less than 70.  A diet low in cholesterol, saturated fat, and calories is the cornerstone of treatment for elevated cholesterol.  Cessation of smoking and exercise are also important in the management of elevated cholesterol and preventing vascular disease.  Studies have shown that 30 to 60 minutes of physical activity most days can help lower blood pressure, lower cholesterol, and keep your weight at a healthy level.  Drug therapy is used when cholesterol levels do not respond to therapeutic lifestyle changes (smoking cessation, diet, and exercise) and remains unacceptably high.  If medication is started, it is important to have you levels checked periodically to evaluate the need for further treatment options.  Thank you,    [facility] [employer] Team

## 2011-01-09 NOTE — Progress Notes (Signed)
Summary: LAB result  Phone Note Outgoing Call   Summary of Call: I spoke to pt on 12/28/09. CK should have been drawn not CK-MB. We will Draw CK on 01/10/10. Pt had been holding Crestor 40 mg and Zetia 10 mg. Pt will continue to hold meds until CK results. Pt verbalized understanding.

## 2011-01-09 NOTE — Miscellaneous (Signed)
Summary: Engage Study Medication List  Engage Study Medication List   Imported By: Lennie Odor 02/28/2010 14:50:55  _____________________________________________________________________  External Attachment:    Type:   Image     Comment:   External Document

## 2011-01-09 NOTE — Assessment & Plan Note (Signed)
Summary: F6M/DM   Visit Type:  Follow-up  CC:  no complaints.  History of Present Illness:  [name] is a pleasant gentleman with a history of nonischemic cardiomyopathy as well as atrial fibrillation.  Note, he had a catheterization in January 2006 that showed no coronary artery disease   and an ejection fraction of 40%.  He had a Myoview performed last on May 04, 2008, that showed an ejection fraction of 31%.  There was felt to be a prior inferior infarct with mild peri-infarct ischemia.  I did review  this and felt there was a low risk and we have been treating medically. He does have permanent atrial fibrillation as well. His last echocardiogram was performed on 5/ 7/10. It revealed an EF of 35-40%. Mild regurgitation. I last saw him in Feb 2011.  Since then  he denies any dyspnea on exertion, orthopnea, PND, palpitations, chest pain, syncope or bleeding. His pedal edema is relatively well controlled on present dose of diuretics. He does state that when he checks his vitals at home his heart rate will occasionally be in the 30s. He denies any symptoms with this.  Current Medications (verified): 1)  Engage Study Drug .... As Directed 2)  Lisinopril 40 Mg  Tabs (Lisinopril) .... Take 1 By Mouth Qd 3)  Metolazone 2.5 Mg  Tabs (Metolazone) .... Take 1 By Mouth Qd 4)  Flomax 0.4 Mg  Cp24 (Tamsulosin Hcl) .... Take 1 By Mouth Two Times A Day 5)  Klor-Con M20 20 Meq  Tbcr (Potassium Chloride Crys Cr) .... Take 4 By Mouth Two Times A Day 6)  Furosemide 80 Mg  Tabs (Furosemide) .... Take 1 By Mouth Qd 7)  Carvedilol 6.25 Mg Tabs (Carvedilol) .... Take One Tablet By Mouth Twice A Day 8)  Pravastatin Sodium 40 Mg Tabs (Pravastatin Sodium) .... Take One Tablet By Mouth Daily At Bedtime  Allergies (verified): No Known Drug Allergies  Past History:  Family History: Last updated: 16-Mar-2009 no cancer Father:decease age 37 heart disease Mother:decease age 77 dabetes heart disease Siblings:1  sister good healt 2 brothers HTN 2 brothers good health  Social History: Last updated: March 16, 2009 married retired Tobacco Use - No.  Alcohol Use - no Drug Use - no  Risk Factors: Smoking Status: never (03/16/2009)  Past Medical History: Reviewed history from 01/23/2010 and no changes required. Congestive heart failure (1997) Hypertension (1997) Osteoarthritis (1997) BPH Obesity (1997) Anemia-NOS Diabetes mellitus, type II Atrial Fibrillation cardiomyopathy Hyperlipidemia hx of peptic ulcer  hx of bradycardia , on atenolol osteoarthritis  Past Surgical History: Reviewed history from 01/23/2010 and no changes required. L-Spine (1965) (R) Knee replacement (1997) Lumbar L4-5 & S1 (02/2000) EGD (06/24/2002)  Social History: Reviewed history from 03/16/09 and no changes required. married retired Tobacco Use - No.  Alcohol Use - no Drug Use - no  Review of Systems       no fevers or chills, productive cough, hemoptysis, dysphasia, odynophagia, melena, hematochezia, dysuria, hematuria, rash, seizure activity, orthopnea, PND,  claudication. Remaining systems are negative.   Vital Signs:  Patient profile:   77 year old male Height:      71 inches Weight:      263.75 pounds Pulse rate:   70 / minute BP sitting:   138 / 84  (left arm) Cuff size:   regular  Vitals Entered By: Caralee Ates CMA (August 07, 2010 10:51 AM)  Physical Exam  General:  Well-developed well-nourished in no acute distress.  Skin  is warm and dry.  HEENT is normal.  Neck is supple. No thyromegaly.  Chest is clear to auscultation with normal expansion.  Cardiovascular exam is regular rate and rhythm.  Abdominal exam nontender or distended. No masses palpated. Extremities show no edema. neuro grossly intact    EKG  Procedure date:  08/07/2010  Findings:      Atrial fibrillation at a rate of 70. Left bundle branch block.  Impression & Recommendations:  Problem # 1:  HYPOKALEMIA  (ICD-276.8) Recheck potassium. Patient now taking his supplement.  Problem # 2:  COUMADIN THERAPY (ICD-V58.61) Monitored in Coumadin clinic. Goal INR 2-3.  Problem # 3:  OTHER PRIMARY CARDIOMYOPATHIES (ICD-425.4) Continue ACE inhibitor and beta blocker. His updated medication list for this problem includes:    Lisinopril 40 Mg Tabs (Lisinopril) .[name]... Take 1 by mouth qd    Metolazone 2.5 Mg Tabs (Metolazone) .[name]... Take 1 by mouth qd    Furosemide 80 Mg Tabs (Furosemide) .[name]... Take 1 by mouth qd    Carvedilol 6.25 Mg Tabs (Carvedilol) .[name]... Take one tablet by mouth twice a day  Problem # 4:  BRADYCARDIA (ICD-427.89)  Patient states that his heart rate occasionally runs in the 30s. Scheduled report our Holter monitor. His updated medication list for this problem includes:    Lisinopril 40 Mg Tabs (Lisinopril) .[name]... Take 1 by mouth qd    Carvedilol 6.25 Mg Tabs (Carvedilol) .[name]... Take one tablet by mouth twice a day  Orders: TLB-BMP (Basic Metabolic Panel-BMET) (80048-METABOL) TLB-CK Total Only(Creatine Kinase/CPK) (82550-CK) Holter Monitor (Holter Monitor)  Problem # 5:  HYPERLIPIDEMIA-MIXED (ICD-272.4) Continue statin. History of elevated CK. Check CK total. His updated medication list for this problem includes:    Pravastatin Sodium 40 Mg Tabs (Pravastatin sodium) .[name]... Take one tablet by mouth daily at bedtime  Problem # 6:  ATRIAL FIBRILLATION (ICD-427.31) Continue beta blocker and Coumadin. His updated medication list for this problem includes:    Carvedilol 6.25 Mg Tabs (Carvedilol) .[name]... Take one tablet by mouth twice a day  Orders: EKG w/ Interpretation (93000) TLB-BMP (Basic Metabolic Panel-BMET) (80048-METABOL) TLB-CK Total Only(Creatine Kinase/CPK) (82550-CK) Holter Monitor (Holter Monitor)  Problem # 7:  EDEMA (ICD-782.3) Continue diuretics.  Problem # 8:  HYPERTENSION (ICD-401.9) Blood pressure relatively well controlled. Will follow and adjust as needed. His  updated medication list for this problem includes:    Lisinopril 40 Mg Tabs (Lisinopril) .[name]... Take 1 by mouth qd    Metolazone 2.5 Mg Tabs (Metolazone) .[name]... Take 1 by mouth qd    Furosemide 80 Mg Tabs (Furosemide) .[name]... Take 1 by mouth qd    Carvedilol 6.25 Mg Tabs (Carvedilol) .[name]... Take one tablet by mouth twice a day  Patient Instructions: 1)  Your physician recommends that you schedule a follow-up appointment in: 6 months with Dr. Jens Som 2)  Your physician recommends that you continue on your current medications as directed. Please refer to the Current Medication list given to you today. 3)  Your physician recommends that you have lab work today: BMET, Ck total 4)  Your physician has recommended that you wear a  24 hour holter monitor.  Holter monitors are medical devices that record the heart's electrical activity. Doctors most often use these monitors to diagnose arrhythmias. Arrhythmias are problems with the speed or rhythm of the heartbeat. The monitor is a small, portable device. You can wear one while you do your normal daily activities. This is usually used to diagnose what is causing palpitations/syncope (passing  out).

## 2011-01-09 NOTE — Progress Notes (Signed)
Summary: decrease carvedilol to 3.125mg  bid  lm to cb  Phone Note Outgoing Call   Call placed by: Charolotte Capuchin, RN,  August 10, 2010 10:58 AM Call placed to: Patient Details for Reason: medication change Summary of Call: After reviewing the holter monitor report which demonstrated A Fib with a decreased heart rate, Dr Jens Som has given orders for the pt to decrease his carvedilol dose to 3.125 mg twice a day.  Left message for pt to call back to be made aware of results and need for medication change. Initial call taken by: Charolotte Capuchin, RN,  August 10, 2010 11:00 AM  Follow-up for Phone Call        attempted to call pt again but line was busy X 3  5:25 pm  Sander Nephew, RN  Left message to call back Deliah Goody, RN  August 11, 2010 12:13 PM  Additional Follow-up for Phone Call Additional follow up Details #1::        p's wife returning call-pls call [phone] Glynda Jaeger  August 11, 2010 2:45 PM  pt wife aware of med change Deliah Goody, RN  August 11, 2010 2:56 PM\par     New/Updated Medications: CARVEDILOL 6.25 MG TABS (CARVEDILOL) Take one half  tablet by mouth twice a day

## 2011-01-09 NOTE — Assessment & Plan Note (Signed)
Summary: 2 mth fu stc   Vital Signs:  Patient profile:   77 year old male Height:      71 inches (180.34 cm) Weight:      257.75 pounds (117.16 kg) BMI:     36.08 O2 Sat:      94 % on Room air Temp:     97.9 degrees F (36.61 degrees C) oral Pulse rate:   55 / minute BP sitting:   122 / 72  (left arm) Cuff size:   large  Vitals Entered By: Brenton Grills CMA Duncan Dull) (October 18, 2010 8:03 AM)  O2 Flow:  Room air CC: Follow-up visit/aj Is Patient Diabetic? No   CC:  Follow-up visit/aj.  History of Present Illness: the status of at least 3 ongoing medical problems is addressed today: hypokalemia:  he says the kcl liquid "doesn't taste that bad." hyperuricemia:  denies foot ot toe pain dm:  pt says his die is "good."  Current Medications (verified): 1)  Engage Study Drug .... As Directed 2)  Lisinopril 40 Mg  Tabs (Lisinopril) .... Take 1 By Mouth Qd 3)  Metolazone 2.5 Mg  Tabs (Metolazone) .... Take 1 By Mouth Qd 4)  Flomax 0.4 Mg  Cp24 (Tamsulosin Hcl) .... Take 1 By Mouth Two Times A Day 5)  Furosemide 80 Mg  Tabs (Furosemide) .... Take 1 By Mouth Qd 6)  Carvedilol 6.25 Mg Tabs (Carvedilol) .... Take One Half  Tablet By Mouth Twice A Day 7)  Pravastatin Sodium 40 Mg Tabs (Pravastatin Sodium) .... Take One Tablet By Mouth Daily At Bedtime 8)  Tylenol 325 Mg Tabs (Acetaminophen) .... As Needed 9)  Allopurinol 100 Mg Tabs (Allopurinol) .[name].. 1 Tab Once Daily 10)  Potassium Chloride 40 Meq/57ml (20%) Liqd (Potassium Chloride) .... 30 Meq Three Times A Day  Allergies (verified): No Known Drug Allergies  Past History:  Past Medical History: Last updated: 08/30/2010 Congestive heart failure (1997) Hypertension (1997) Osteoarthritis (1997) BPH Obesity (1997) Anemia-NOS Diabetes mellitus, type II Atrial Fibrillation cardiomyopathy Hyperlipidemia hx of peptic ulcer  hx of bradycardia , on atenolol osteoarthritis  cardiol-dr crenshaw  Review of Systems  The  patient denies dyspnea on exertion.         he has lost a few lbs, due to his efforts  Physical Exam  General:  morbidly obese.  no distress  Pulses:  dorsalis pedis intact bilat.  Extremities:  no deformity.  no ulcer on the feet.  feet are of normal temp.   there is bilat leg hyperpigmentation c/w chronic venous inufficiency there is a very heavy callous on the plantar aspect of the right great toe mtp area there are bilat varicosities  2+ right pedal edema, and 2+ left pedal edema.  2+ right pedal edema, 2+ left pedal edema, and mycotic toenails.   Neurologic:  sensation is intact to touch on the feet  Additional Exam:  Sodium                    143 mEq/L                   135-145   Potassium                 4.1 mEq/L                   3.5-5.1   Chloride  103 mEq/L                   96-112   Carbon Dioxide       [H]  33 mEq/L                    19-32   Glucose              [H]  125 mg/dL                   16-10   BUN                       23 mg/dL                    9-60   Creatinine                0.8 mg/dL                   4.5-4.0   Calcium                   9.4 mg/dL                   9.8-11.9      Uric Acid                 6.7 mg/dL                   1.4-7.8    Hemoglobin A1C       [H]  6.7 %        Impression & Recommendations:  Problem # 1:  HYPOKALEMIA (ICD-276.8) Assessment Improved  Problem # 2:  DM (ICD-250.00) well-controlled  Problem # 3:  HYPERURICEMIA (ICD-790.6) Assessment: Improved  Other Orders: TLB-BMP (Basic Metabolic Panel-BMET) (80048-METABOL) TLB-Uric Acid, Blood (84550-URIC) TLB-A1C / Hgb A1C (Glycohemoglobin) (83036-A1C) Est. Patient Level IV (29562)  Patient Instructions: 1)  blood tests are being ordered for you today.  please call [phone] to hear your test results. 2)  pending the test results, please continue the same medications for now. 3)  Please schedule a follow-up appointment in 6 months. 4)  (update: i left  message on phone-tree:  rx as we discussed)   Orders Added: 1)  TLB-BMP (Basic Metabolic Panel-BMET) [80048-METABOL] 2)  TLB-Uric Acid, Blood [84550-URIC] 3)  TLB-A1C / Hgb A1C (Glycohemoglobin) [83036-A1C] 4)  Est. Patient Level IV [84696]

## 2011-01-09 NOTE — Assessment & Plan Note (Signed)
Summary: F6M/DM   CC:  pt complains of increasing BP.  History of Present Illness:  [name] is a pleasant gentleman with a history of nonischemic cardiomyopathy as well as atrial fibrillation.  Note, he had a catheterization in January 2006 that showed no coronary artery disease   and an ejection fraction of 40%.  He had a Myoview performed last on May 04, 2008, that showed an ejection fraction of 31%.  There was felt to be a prior inferior infarct with mild peri-infarct ischemia.  I did review  this and felt there was a low risk and we have been treating medically. He does have permanent atrial fibrillation as well. His last echocardiogram was performed on 5/ 7/10. It revealed an EF of 35-40%. Mild regurgitation. I last saw him in August of 2010. He was noted to have an elevated CK on research laboratories and his statin was discontinued. His CK improved. Since I last saw him  he denies any dyspnea on exertion, orthopnea, PND, palpitations, chest pain, syncope or bleeding. His pedal edema is relatively well controlled on present dose of diuretics.  Current Medications (verified): 1)  Engage Study Drug .... As Directed 2)  Lisinopril 40 Mg  Tabs (Lisinopril) .... Take 1 By Mouth Qd 3)  Metolazone 2.5 Mg  Tabs (Metolazone) .... Take 1 By Mouth Qd 4)  Flomax 0.4 Mg  Cp24 (Tamsulosin Hcl) .... Take 1 By Mouth Two Times A Day Qd 5)  Klor-Con M20 20 Meq  Tbcr (Potassium Chloride Crys Cr) .... Take 4 By Mouth Two Times A Day Qd 6)  Furosemide 80 Mg  Tabs (Furosemide) .... Take 1 By Mouth Qd 7)  Carvedilol 3.125 Mg Tabs (Carvedilol) .[name].. 1 Tab By Mouth Bid  Allergies: No Known Drug Allergies  Past History:  Past Medical History: Congestive heart failure (1997) Hypertension (1997) Osteoarthritis (1997) BPH Obesity (1997) Anemia-NOS Diabetes mellitus, type II Atrial Fibrillation cardiomyopathy Hyperlipidemia hx of peptic ulcer  hx of bradycardia , on atenolol osteoarthritis  Past  Surgical History: L-Spine (1965) (R) Knee replacement (1997) Lumbar L4-5 & S1 (02/2000) EGD (06/24/2002)  Social History: Reviewed history from 02/21/2009 and no changes required. married retired Tobacco Use - No.  Alcohol Use - no Drug Use - no  Review of Systems       Previous mild dose on Crestor have improved. no fevers or chills, productive cough, hemoptysis, dysphasia, odynophagia, melena, hematochezia, dysuria, hematuria, rash, seizure activity, orthopnea, PND,  claudication. Remaining systems are negative.   Vital Signs:  Patient profile:   77 year old male Height:      71 inches Weight:      262 pounds BMI:     36.67 Pulse rate:   78 / minute Resp:     12 per minute BP sitting:   142 / 80  (left arm)  Vitals Entered By: Kem Parkinson (January 23, 2010 9:40 AM)  Physical Exam  General:  Well-developed well-nourished in no acute distress.  Skin is warm and dry.  HEENT is normal.  Neck is supple. No thyromegaly.  Chest is clear to auscultation with normal expansion.  Cardiovascular exam is irregular Abdominal exam nontender or distended. No masses palpated. Extremities show trace edema. neuro grossly intact    EKG  Procedure date:  01/23/2010  Findings:      atrial fibrillation at a rate of 78. PVCs or aberrantly conducted beats. Left bundle branch block.  Impression & Recommendations:  Problem # 1:  OTHER  PRIMARY CARDIOMYOPATHIES (ICD-425.4) Patient has a nonischemic cardio myopathy. Continue lisinopril. Increase Coreg to 6.25 mg p.o. b.i.d. His updated medication list for this problem includes:    Lisinopril 40 Mg Tabs (Lisinopril) .[name]... Take 1 by mouth qd    Metolazone 2.5 Mg Tabs (Metolazone) .[name]... Take 1 by mouth qd    Furosemide 80 Mg Tabs (Furosemide) .[name]... Take 1 by mouth qd    Carvedilol 6.25 Mg Tabs (Carvedilol) .[name]... Take one tablet by mouth twice a day  Problem # 2:  ATRIAL FIBRILLATION (ICD-427.31)  Continue carvedilol but increased  to 6.25 mg p.o. b.i.d. Continue on anticoagulation study drug. His updated medication list for this problem includes:    Carvedilol 6.25 Mg Tabs (Carvedilol) .[name]... Take one tablet by mouth twice a day  His updated medication list for this problem includes:    Carvedilol 3.125 Mg Tabs (Carvedilol) .[name]... 1 tab by mouth bid  Problem # 3:  HYPERLIPIDEMIA-MIXED (ICD-272.4) Recent elevation in CK on Crestor. This has now normalized. Will begin Pravachol 40 mg p.o. daily. Check lipids, liver and CK in 6 weeks. The following medications were removed from the medication list:    Crestor 40 Mg Tabs (Rosuvastatin calcium) .[name]... Take 1 by mouth qd    Zetia 10 Mg Tabs (Ezetimibe) .[name]... Take 1 by mouth qd His updated medication list for this problem includes:    Pravastatin Sodium 40 Mg Tabs (Pravastatin sodium) .[name]... Take one tablet by mouth daily at bedtime  The following medications were removed from the medication list:    Crestor 40 Mg Tabs (Rosuvastatin calcium) .[name]... Take 1 by mouth qd    Zetia 10 Mg Tabs (Ezetimibe) .[name]... Take 1 by mouth qd  Problem # 4:  EDEMA (ICD-782.3) Controlled on present dose of diuretics. Check bmet. Orders: TLB-BMP (Basic Metabolic Panel-BMET) (80048-METABOL)  Problem # 5:  HYPERTENSION (ICD-401.9) Blood pressure elevated. Increase Coreg to 6.25 mg p.o. b.i.d. Add Norvasc later blood pressure not controlled with above. His updated medication list for this problem includes:    Lisinopril 40 Mg Tabs (Lisinopril) .[name]... Take 1 by mouth qd    Metolazone 2.5 Mg Tabs (Metolazone) .[name]... Take 1 by mouth qd    Furosemide 80 Mg Tabs (Furosemide) .[name]... Take 1 by mouth qd    Carvedilol 6.25 Mg Tabs (Carvedilol) .[name]... Take one tablet by mouth twice a day  Patient Instructions: 1)  Your physician recommends that you schedule a follow-up appointment in: 6 MONTHS 2)  Your physician recommends that you return for a FASTING lipid profile: IN 6 WEEKS-1ST WEEK IN APRIL 3)  Your  physician has recommended you make the following change in your medication: INCREASE CARVEDILOL 6.25MG  ONE TABLET TWICE DAILY 4)  START PRAVASTATIN 40MG  ONE TABLET AT BEDTIME Prescriptions: PRAVASTATIN SODIUM 40 MG TABS (PRAVASTATIN SODIUM) Take one tablet by mouth daily at bedtime  #30 x 12   Entered by:   Deliah Goody, RN   Authorized by:   Ferman Hamming, MD, [facility]   Signed by:   Deliah Goody, RN on 01/23/2010   Method used:   Electronically to        [facility] [address] #[id number]* (retail)       [address] [place], [place]  [address]       Ph: [phone]       Fax: [phone]   RxID:   [id number] CARVEDILOL 6.25 MG TABS (CARVEDILOL) Take one tablet by mouth twice a  day  #60 x 12   Entered by:   Deliah Goody, RN   Authorized by:   Ferman Hamming, MD, [facility]   Signed by:   Deliah Goody, RN on 01/23/2010   Method used:   Electronically to        [facility] #[id number]* (retail)       [address] [place], [place]  [address]       Ph: [phone]       Fax: [phone]   RxID:   [id number]

## 2011-01-09 NOTE — Letter (Signed)
Summary: [facility]  [facility]   Imported By: Marylou Mccoy 05/17/2010 12:04:10  _____________________________________________________________________  External Attachment:    Type:   Image     Comment:   External Document

## 2011-01-09 NOTE — Assessment & Plan Note (Signed)
Summary: RX REFILL-LB   Vital Signs:  Patient profile:   77 year old male Height:      71 inches (180.34 cm) Weight:      257 pounds (116.82 kg) BMI:     35.97 O2 Sat:      93 % on Room air Temp:     97.1 degrees F (36.17 degrees C) oral Pulse rate:   60 / minute BP sitting:   132 / 78  (left arm) Cuff size:   large  Vitals Entered By: Brenton Grills MA (July 31, 2010 8:04 AM)  O2 Flow:  Room air CC: Refills on medications/aj   CC:  Refills on medications/aj.  History of Present Illness: pt states he feels well in general, except for right knee pain.  he is due to have it replaced, but he hesitates due to his other medical conditions.   hypokalemia is noted on labs.  Current Medications (verified): 1)  Engage Study Drug .... As Directed 2)  Lisinopril 40 Mg  Tabs (Lisinopril) .... Take 1 By Mouth Qd 3)  Metolazone 2.5 Mg  Tabs (Metolazone) .... Take 1 By Mouth Qd 4)  Flomax 0.4 Mg  Cp24 (Tamsulosin Hcl) .... Take 1 By Mouth Two Times A Day Qd 5)  Klor-Con M20 20 Meq  Tbcr (Potassium Chloride Crys Cr) .... Take 4 By Mouth Two Times A Day Qd 6)  Furosemide 80 Mg  Tabs (Furosemide) .... Take 1 By Mouth Qd 7)  Carvedilol 6.25 Mg Tabs (Carvedilol) .... Take One Tablet By Mouth Twice A Day 8)  Pravastatin Sodium 40 Mg Tabs (Pravastatin Sodium) .... Take One Tablet By Mouth Daily At Bedtime  Allergies (verified): No Known Drug Allergies  Past History:  Past Medical History: Last updated: 01/23/2010 Congestive heart failure (1997) Hypertension (1997) Osteoarthritis (1997) BPH Obesity (1997) Anemia-NOS Diabetes mellitus, type II Atrial Fibrillation cardiomyopathy Hyperlipidemia hx of peptic ulcer  hx of bradycardia , on atenolol osteoarthritis  Social History: Reviewed history from 02/21/2009 and no changes required. married retired Tobacco Use - No.  Alcohol Use - no Drug Use - no  Review of Systems  The patient denies weight loss and weight gain.     Physical Exam  General:  obese.  no distress  Extremities:  trace right pedal edema and trace left pedal edema.   Additional Exam:  k+=2.2   Impression & Recommendations:  Problem # 1:  knee pain unchanged  Problem # 2:  hypokalemia needs increased rx  Medications Added to Medication List This Visit: 1)  Flomax 0.4 Mg Cp24 (Tamsulosin hcl) .... Take 1 by mouth two times a day 2)  Klor-con M20 20 Meq Tbcr (Potassium chloride crys cr) .... Take 4 by mouth two times a day 3)  Carvedilol 6.25 Mg Tabs (Carvedilol) .... Take one tablet by mouth twice a day  Other Orders: TLB-Lipid Panel (80061-LIPID) TLB-BMP (Basic Metabolic Panel-BMET) (80048-METABOL) TLB-CBC Platelet - w/Differential (85025-CBCD) TLB-Hepatic/Liver Function Pnl (80076-HEPATIC) TLB-TSH (Thyroid Stimulating Hormone) (84443-TSH) TLB-Uric Acid, Blood (84550-URIC) TLB-A1C / Hgb A1C (Glycohemoglobin) (83036-A1C) TLB-PSA (Prostate Specific Antigen) (84153-PSA) TLB-Udip w/ Micro (81001-URINE) Est. Patient Level III ([address])  Patient Instructions: 1)  blood tests are being ordered for you today.  please call [phone] to hear your test results. 2)  Please schedule a physical appointment in 1 month. 3)  update:  pt is called about hypokalemia--see lab page Prescriptions: PRAVASTATIN SODIUM 40 MG TABS (PRAVASTATIN SODIUM) Take one tablet by mouth daily at bedtime  #90 x  3   Entered and Authorized by:   Minus Breeding MD   Signed by:   Minus Breeding MD on 07/31/2010   Method used:   Electronically to        [facility] #[id number]* (retail)       [address] [place], [place]  [address]       Ph: [phone]       Fax: [phone]   RxID:   [id number] CARVEDILOL 6.25 MG TABS (CARVEDILOL) Take one tablet by mouth twice a day  #180 x 3   Entered and Authorized by:   Minus Breeding MD   Signed by:   Minus Breeding MD on 07/31/2010   Method used:   Electronically to        [facility]  #[id number]* (retail)       [address] [place], [place]  [address]       Ph: [phone]       Fax: [phone]   RxID:   [id number] FUROSEMIDE 80 MG  TABS (FUROSEMIDE) take 1 by mouth qd  #90 x 3   Entered and Authorized by:   Minus Breeding MD   Signed by:   Minus Breeding MD on 07/31/2010   Method used:   Electronically to        [facility] #[id number]* (retail)       [address] [place], [place]  [address]       Ph: [phone]       Fax: [phone]   RxID:   [id number] KLOR-CON M20 20 MEQ  TBCR (POTASSIUM CHLORIDE CRYS CR) TAKE 4 by mouth two times a day  #360 x 3   Entered and Authorized by:   Minus Breeding MD   Signed by:   Minus Breeding MD on 07/31/2010   Method used:   Electronically to        [facility] #[id number]* (retail)       [address] [place], [place]  [address]       Ph: [phone]       Fax: [phone]   RxID:   [id number] FLOMAX 0.4 MG  CP24 (TAMSULOSIN HCL) TAKE 1 by mouth two times a day  #180 x 3   Entered and Authorized by:   Minus Breeding MD   Signed by:   Minus Breeding MD on 07/31/2010   Method used:   Electronically to        [facility] #[id number]* (retail)       [address] [place], [place]  [address]       Ph: [phone]       Fax: [phone]   RxID:   [id number] METOLAZONE 2.5 MG  TABS (METOLAZONE) TAKE 1 by mouth QD  #90 x 3   Entered and Authorized by:   Minus Breeding MD   Signed by:   Minus Breeding MD on 07/31/2010   Method used:   Electronically to        [facility] #[id number]* (retail)       [address] [place], [place]  [address]       Ph: [phone]  Fax: [phone]   RxID:   [id number] LISINOPRIL 40 MG  TABS (LISINOPRIL) TAKE 1 by mouth QD  #90 x 3   Entered and Authorized by:   Minus Breeding MD   Signed by:   Minus Breeding MD on 07/31/2010   Method used:   Electronically to        [facility] #[id number]*  (retail)       [address] [place], [place]  [address]       Ph: [phone]       Fax: [phone]   RxID:   [id number]

## 2011-01-09 NOTE — Procedures (Signed)
Summary: summary report  summary report   Imported By: Mirna Mires 08/11/2010 14:10:29  _____________________________________________________________________  External Attachment:    Type:   Image     Comment:   External Document

## 2011-01-09 NOTE — Assessment & Plan Note (Signed)
Summary: 1 MTH YEARLY---STC   Vital Signs:  Patient profile:   77 year old male Height:      71 inches (180.34 cm) Weight:      259.50 pounds (117.95 kg) BMI:     36.32 O2 Sat:      95 % on Room air Temp:     98.3 degrees F (36.83 degrees C) oral Pulse rate:   60 / minute BP sitting:   112 / 68  (left arm) Cuff size:   large  Vitals Entered By: Brenton Grills MA (August 30, 2010 9:02 AM)  O2 Flow:  Room air CC: Yearly Physical/aj Is Patient Diabetic? Yes  Vision Screening:Left eye w/o correction: 20 / 50 Right Eye w/o correction: 20 / 20 Both eyes w/o correction:  20/ 25        Vision Entered By: Brenton Grills MA (August 30, 2010 9:55 AM)   CC:  Yearly Physical/aj.  History of Present Illness: pt is here for medicare welllness visit.  he denies memory loss and depression.  he says he is able to perform activities of daily living without assistance.  he has no limitations to physical activity.   Current Medications (verified): 1)  Engage Study Drug .... As Directed 2)  Lisinopril 40 Mg  Tabs (Lisinopril) .... Take 1 By Mouth Qd 3)  Metolazone 2.5 Mg  Tabs (Metolazone) .... Take 1 By Mouth Qd 4)  Flomax 0.4 Mg  Cp24 (Tamsulosin Hcl) .... Take 1 By Mouth Two Times A Day 5)  Klor-Con M20 20 Meq  Tbcr (Potassium Chloride Crys Cr) .... Take 4 By Mouth Two Times A Day 6)  Furosemide 80 Mg  Tabs (Furosemide) .... Take 1 By Mouth Qd 7)  Carvedilol 6.25 Mg Tabs (Carvedilol) .... Take One Half  Tablet By Mouth Twice A Day 8)  Pravastatin Sodium 40 Mg Tabs (Pravastatin Sodium) .... Take One Tablet By Mouth Daily At Bedtime 9)  Tylenol 325 Mg Tabs (Acetaminophen) .... As Needed  Allergies (verified): No Known Drug Allergies  Past History:  Past Medical History: Congestive heart failure (1997) Hypertension (1997) Osteoarthritis (1997) BPH Obesity (1997) Anemia-NOS Diabetes mellitus, type II Atrial Fibrillation cardiomyopathy Hyperlipidemia hx of peptic ulcer    hx of bradycardia , on atenolol osteoarthritis  cardiol-dr crenshaw  Family History: Reviewed history from 02/21/2009 and no changes required. no cancer Father:decease age 35 heart disease Mother:decease age 37 dabetes heart disease Siblings:1 sister good healt 2 brothers HTN 2 brothers good health  Social History: Reviewed history from 02/21/2009 and no changes required. married retired Tobacco Use - No.  Alcohol Use - no Drug Use - no illegal drugs:  no  Physical Exam  General:  morbidly obese.   Ears:  grossly normal hearing.   Msk:  pt quickly performs "get-up-and-go" from a sitting position, although he has pain at the left knee with arising and walking  Psych:  remembers 1/3 at 5 minutes (?cooperation).  can easily read and write a sentence.  alert and oriented x 3  Additional Exam:  SEPARATE EVALUATION FOLLOWS--EACH PROBLEM HERE IS NEW, NOT RESPONDING TO TREATMENT, OR POSES SIGNIFICANT RISK TO THE PATIENT'S HEALTH: HISTORY OF THE PRESENT ILLNESS: he takes klor 8 tabs/day.  no cramps he takes no med for gout.  no recent gout sxs. PAST MEDICAL HISTORY reviewed and up to date today REVIEW OF SYSTEMS: denies muscle cramps PHYSICAL EXAMINATION: no deformity.  no ulcer on the feet.  feet are of normal color and temp.  no edema dorsalis pedis intact bilat.  no carotid bruit LAB/XRAY RESULTS: low k+, and high uric acid are noted IMPRESSION: hypokalemia, needs increased rx hyperuricaemia, needs increased rx PLAN: see instruction sheet   Impression & Recommendations:  Problem # 1:  ROUTINE GENERAL MEDICAL EXAM@HEALTH  CARE FACL (ICD-V70.0)  Medications Added to Medication List This Visit: 1)  Tylenol 325 Mg Tabs (Acetaminophen) .... As needed 2)  Allopurinol 100 Mg Tabs (Allopurinol) .[name].. 1 tab once daily 3)  Potassium Chloride 40 Meq/53ml (20%) Liqd (Potassium chloride) .... 30 meq three times a day  Other Orders: Flu Vaccine 40yrs + MEDICARE PATIENTS  (Z6109) Administration Flu vaccine - MCR (G0008) Pneumococcal Vaccine Ped < 57yrs ([address]) Admin 1st Vaccine (09811) TLB-BMP (Basic Metabolic Panel-BMET) (80048-METABOL) Est. Patient Level III (91478) Medicare -1st Annual Wellness Visit [phone])  Patient Instructions: 1)  please consider these measures for your health:  minimize alcohol.  do not use tobacco products.  have a colonoscopy at least every 10 years from age 55.  keep firearms safely stored.  always use seat belts.  have working smoke alarms in your home.  see an eye doctor and dentist regularly.  never drive under the influence of alcohol or drugs (including prescription drugs).  those with fair skin should take precautions against the sun. 2)  please let me know what your wishes would be, if artificial life support measures should become necessary.  it is critically important to prevent falling down (keep floor areas well-lit, dry, and free of loose objects) 3)  it is critically important to maintain a healthy body weight.  i would be happy to refer you to a dietician.  4)  blood tests are being ordered for you today.  please call [phone] to hear your test results. 5)  to save money, i will change your potassium to liquid form 6)  start allopurinol 100 mg once daily 7)  Please schedule a follow-up appointment in 2 months. 8)  here is a list of medicare-covered preventive services. 9)  (update: i left message on phone-tree:  increase kcl to 30 meq three times a day). Prescriptions: POTASSIUM CHLORIDE 40 MEQ/15ML (20%) LIQD (POTASSIUM CHLORIDE) 30 meq three times a day  #30 days x 11   Entered and Authorized by:   Minus Breeding MD   Signed by:   Minus Breeding MD on 08/30/2010   Method used:   Electronically to        [facility] #[id number]* (retail)       [address] [place], [place]  [address]       Ph: [phone]       Fax: [phone]   RxID:   [id number] ALLOPURINOL 100 MG TABS (ALLOPURINOL) 1 tab once  daily  #30 x 11   Entered and Authorized by:   Minus Breeding MD   Signed by:   Minus Breeding MD on 08/30/2010   Method used:   Electronically to        [facility] #[id number]* (retail)       [address] [place], [place]  [address]       Ph: [phone]       Fax: [phone]   RxID:   [id number]           Flu Vaccine Consent Questions     Do you have a history of severe allergic reactions to this  vaccine? no    Any prior history of allergic reactions to egg and/or gelatin? no    Do you have a sensitivity to the preservative Thimersol? no    Do you have a past history of Guillan-Barre Syndrome? no    Do you currently have an acute febrile illness? no    Have you ever had a severe reaction to latex? no    Vaccine information given and explained to patient? yes    Are you currently pregnant? no    Lot Number:[id number]   Exp Date:06/09/2011   Site Given  Left Deltoid IMlu   Immunizations Administered:  Pediatric Pneumococcal Vaccine:    Vaccine Type: Prevnar    Site: right deltoid    Mfr: Merck    Dose: 0.5 ml    Route: IM    Given by: Brenton Grills MA    Exp. Date: 02/21/2014    Lot #: [id number]    VIS given: 03/25/09 version given August 30, 2010.

## 2011-02-06 ENCOUNTER — Encounter (INDEPENDENT_AMBULATORY_CARE_PROVIDER_SITE_OTHER): Payer: Medicare Other

## 2011-02-06 ENCOUNTER — Ambulatory Visit (INDEPENDENT_AMBULATORY_CARE_PROVIDER_SITE_OTHER): Payer: Medicare Other | Admitting: Cardiology

## 2011-02-06 ENCOUNTER — Encounter: Payer: Self-pay | Admitting: Cardiology

## 2011-02-06 ENCOUNTER — Other Ambulatory Visit: Payer: Self-pay | Admitting: Cardiology

## 2011-02-06 DIAGNOSIS — E785 Hyperlipidemia, unspecified: Secondary | ICD-10-CM

## 2011-02-06 DIAGNOSIS — R0989 Other specified symptoms and signs involving the circulatory and respiratory systems: Secondary | ICD-10-CM

## 2011-02-06 DIAGNOSIS — E876 Hypokalemia: Secondary | ICD-10-CM

## 2011-02-06 DIAGNOSIS — I10 Essential (primary) hypertension: Secondary | ICD-10-CM

## 2011-02-06 DIAGNOSIS — I4891 Unspecified atrial fibrillation: Secondary | ICD-10-CM

## 2011-02-06 DIAGNOSIS — I428 Other cardiomyopathies: Secondary | ICD-10-CM

## 2011-02-06 LAB — BASIC METABOLIC PANEL
Chloride: 100 mEq/L (ref 96–112)
GFR: 96.09 mL/min (ref >60.00)
Potassium: 3 mEq/L — ABNORMAL LOW (ref 3.5–5.1)
Sodium: 135 mEq/L (ref 135–145)

## 2011-02-06 LAB — LIPID PANEL
Cholesterol: 166 mg/dL (ref 0–200)
HDL: 55.1 mg/dL (ref >39.00)
Triglycerides: 48 mg/dL (ref 0.0–149.0)
VLDL: 9.6 mg/dL (ref 0.0–40.0)

## 2011-02-06 LAB — HEPATIC FUNCTION PANEL
ALT: 18 U/L (ref 0–53)
Albumin: 3.7 g/dL (ref 3.5–5.2)
Total Protein: 6.4 g/dL (ref 6.0–8.3)

## 2011-02-12 ENCOUNTER — Encounter: Payer: Self-pay | Admitting: Cardiology

## 2011-02-12 ENCOUNTER — Other Ambulatory Visit: Payer: Self-pay | Admitting: Cardiology

## 2011-02-12 ENCOUNTER — Other Ambulatory Visit (INDEPENDENT_AMBULATORY_CARE_PROVIDER_SITE_OTHER): Payer: Medicare Other

## 2011-02-12 DIAGNOSIS — Z79899 Other long term (current) drug therapy: Secondary | ICD-10-CM

## 2011-02-12 DIAGNOSIS — E876 Hypokalemia: Secondary | ICD-10-CM

## 2011-02-12 LAB — BASIC METABOLIC PANEL
BUN: 24 mg/dL — ABNORMAL HIGH (ref 6–23)
GFR: 89.74 mL/min (ref >60.00)
Potassium: 3.5 mEq/L (ref 3.5–5.1)
Sodium: 137 mEq/L (ref 135–145)

## 2011-02-13 ENCOUNTER — Other Ambulatory Visit: Payer: Medicare Other

## 2011-02-15 NOTE — Assessment & Plan Note (Signed)
Summary: F6M/DFG/PER PT CALL/MJ PT AWARE TO BRING INS CARD/MJ/AC   History of Present Illness:  [name] is a pleasant gentleman with a history of nonischemic cardiomyopathy as well as atrial fibrillation.  Note, he had a catheterization in January 2006 that showed no coronary artery disease   and an ejection fraction of 40%.  He had a Myoview performed last on May 04, 2008, that showed an ejection fraction of 31%.  There was felt to be a prior inferior infarct with mild peri-infarct ischemia.  I did review  this and felt there was a low risk and we have been treating medically. He does have permanent atrial fibrillation as well. His last echocardiogram was performed on 5/ 7/10. It revealed an EF of 35-40%. Mild regurgitation. Holter monitor in August of 2011 showed mildly reduced rate in we decreased his Coreg. I last saw him in August of 2011. Since then, the patient denies any dyspnea on exertion, orthopnea, PND,  palpitations, syncope or chest pain. He has chronic pedal edema.   Current Medications (verified): 1)  Engage Study Drug .... As Directed 2)  Lisinopril 40 Mg  Tabs (Lisinopril) .... Take 1 By Mouth Qd 3)  Metolazone 2.5 Mg  Tabs (Metolazone) .... Take 1 By Mouth Qd 4)  Flomax 0.4 Mg  Cp24 (Tamsulosin Hcl) .... Take 1 By Mouth Two Times A Day 5)  Furosemide 80 Mg  Tabs (Furosemide) .... Take 1 By Mouth Qd 6)  Carvedilol 6.25 Mg Tabs (Carvedilol) .... Take One Half  Tablet By Mouth Twice A Day 7)  Pravastatin Sodium 40 Mg Tabs (Pravastatin Sodium) .... Take One Tablet By Mouth Daily At Bedtime 8)  Tylenol 325 Mg Tabs (Acetaminophen) .... As Needed 9)  Allopurinol 100 Mg Tabs (Allopurinol) .[name].. 1 Tab Once Daily 10)  Potassium Chloride 40 Meq/10ml (20%) Liqd (Potassium Chloride) .... 30 Meq Three Times A Day  Allergies: No Known Drug Allergies  Past History:  Past Medical History: Reviewed history from 08/30/2010 and no changes required. Congestive heart failure  (1997) Hypertension (1997) Osteoarthritis (1997) BPH Obesity (1997) Anemia-NOS Diabetes mellitus, type II Atrial Fibrillation cardiomyopathy Hyperlipidemia hx of peptic ulcer  hx of bradycardia , on atenolol osteoarthritis  cardiol-dr Kaytelynn Scripter  Past Surgical History: Reviewed history from 01/23/2010 and no changes required. L-Spine (1965) (R) Knee replacement (1997) Lumbar L4-5 & S1 (02/2000) EGD (06/24/2002)  Social History: Reviewed history from 08/30/2010 and no changes required. married retired Tobacco Use - No.  Alcohol Use - no Drug Use - no illegal drugs:  no  Review of Systems       no fevers or chills, productive cough, hemoptysis, dysphasia, odynophagia, melena, hematochezia, dysuria, hematuria, rash, seizure activity, orthopnea, PND, pedal edema, claudication. Remaining systems are negative.   Vital Signs:  Patient profile:   77 year old male Height:      71 inches Weight:      257 pounds BMI:     35.97 Pulse rate:   70 / minute Resp:     14 per minute BP sitting:   130 / 60  (left arm)  Vitals Entered By: Kem Parkinson (February 06, 2011 9:10 AM)  Physical Exam  General:  Well-developed well-nourished in no acute distress.  Skin is warm and dry.  HEENT is normal.  Neck is supple. No thyromegaly.  Chest is clear to auscultation with normal expansion.  Cardiovascular exam is irregular Abdominal exam nontender or distended. No masses palpated. Extremities show 1+ edema. Chronic skin  changes. neuro grossly intact    Impression & Recommendations:  Problem # 1:  DM (ICD-250.00)  His updated medication list for this problem includes:    Lisinopril 40 Mg Tabs (Lisinopril) .[name]... Take 1 by mouth qd  Problem # 2:  OTHER PRIMARY CARDIOMYOPATHIES (ICD-425.4) Continue present medications. Cannot advance beta blocker secondary to bradycardia. His updated medication list for this problem includes:    Lisinopril 40 Mg Tabs (Lisinopril) .[name]... Take 1  by mouth qd    Metolazone 2.5 Mg Tabs (Metolazone) .[name]... Take 1 by mouth qd    Furosemide 80 Mg Tabs (Furosemide) .[name]... Take 1 by mouth qd    Carvedilol 6.25 Mg Tabs (Carvedilol) .[name]... Take one half  tablet by mouth twice a day  Problem # 3:  HYPERLIPIDEMIA-MIXED (ICD-272.4)  Continue statin. Check lipids and liver. His updated medication list for this problem includes:    Pravastatin Sodium 40 Mg Tabs (Pravastatin sodium) .[name]... Take one tablet by mouth daily at bedtime  Orders: TLB-Lipid Panel (80061-LIPID) TLB-Hepatic/Liver Function Pnl (80076-HEPATIC)  Problem # 4:  ATRIAL FIBRILLATION (ICD-427.31) Continue beta blocker and anticoagulation. His updated medication list for this problem includes:    Carvedilol 6.25 Mg Tabs (Carvedilol) .[name]... Take one half  tablet by mouth twice a day  Problem # 5:  EDEMA (ICD-782.3) Continue diuretics. Check potassium and renal function.  Problem # 6:  HYPERTENSION (ICD-401.9) Blood pressure controlled on present medications. Will continue. His updated medication list for this problem includes:    Lisinopril 40 Mg Tabs (Lisinopril) .[name]... Take 1 by mouth qd    Metolazone 2.5 Mg Tabs (Metolazone) .[name]... Take 1 by mouth qd    Furosemide 80 Mg Tabs (Furosemide) .[name]... Take 1 by mouth qd    Carvedilol 6.25 Mg Tabs (Carvedilol) .[name]... Take one half  tablet by mouth twice a day  Other Orders: TLB-BMP (Basic Metabolic Panel-BMET) (80048-METABOL)  Patient Instructions: 1)  Your physician wants you to follow-up in:  6 MONTHS   You will receive a reminder letter in the mail two months in advance. If you don't receive a letter, please call our office to schedule the follow-up appointment.

## 2011-03-06 ENCOUNTER — Encounter (INDEPENDENT_AMBULATORY_CARE_PROVIDER_SITE_OTHER): Payer: Medicare Other

## 2011-03-06 DIAGNOSIS — R0989 Other specified symptoms and signs involving the circulatory and respiratory systems: Secondary | ICD-10-CM

## 2011-03-08 ENCOUNTER — Encounter: Payer: Self-pay | Admitting: Cardiology

## 2011-03-08 ENCOUNTER — Other Ambulatory Visit: Payer: Self-pay | Admitting: *Deleted

## 2011-03-08 DIAGNOSIS — I509 Heart failure, unspecified: Secondary | ICD-10-CM

## 2011-03-22 ENCOUNTER — Other Ambulatory Visit (INDEPENDENT_AMBULATORY_CARE_PROVIDER_SITE_OTHER): Payer: Medicare Other | Admitting: *Deleted

## 2011-03-22 DIAGNOSIS — I509 Heart failure, unspecified: Secondary | ICD-10-CM

## 2011-03-22 LAB — BASIC METABOLIC PANEL
BUN: 18 mg/dL (ref 6–23)
Calcium: 8.8 mg/dL (ref 8.4–10.5)
GFR: 130.21 mL/min (ref >60.00)
Glucose, Bld: 106 mg/dL — ABNORMAL HIGH (ref 70–99)
Sodium: 138 mEq/L (ref 135–145)

## 2011-04-09 ENCOUNTER — Encounter (INDEPENDENT_AMBULATORY_CARE_PROVIDER_SITE_OTHER): Payer: Medicare Other

## 2011-04-09 DIAGNOSIS — R0989 Other specified symptoms and signs involving the circulatory and respiratory systems: Secondary | ICD-10-CM

## 2011-04-24 NOTE — Assessment & Plan Note (Signed)
[facility]                            CARDIOLOGY OFFICE NOTE   NAME:Bellmore, COSME JACOB                  MRN:          [id number]  DATE:02/22/2009                            DOB:          Apr 30, 1929    Mr. Vernet is a pleasant gentleman with a history of nonischemic  cardiomyopathy as well as atrial fibrillation.  Note, he had a  catheterization in January 2006 that showed no coronary artery disease  and an ejection fraction of 40%.  His last echocardiogram was performed  on Apr 13, 2008.  Ejection fraction of 45%.  There was mild mitral  regurgitation and mild left atrial enlargement noted.  There was mild  tricuspid regurgitation.  He had a Myoview performed last on May 04, 2008, that showed an ejection fraction of 31%.  There was felt to be a  prior inferior infarct with mild peri-infarct ischemia.  I did review  this and felt there was a low risk and we have been treating medically.  He does have permanent atrial fibrillation as well.  He also recently  has been complaining of edema.  Since I last saw him, he has taken his  last Demadex at high doses and stated improved, but he took 80 mg of  Demadex.  He denies any dyspnea, orthopnea, PND, or chest pain.  Note,  he did not discontinue his Norvasc likely discussed previously.   MEDICATIONS:  1. Lisinopril 40 mg p.o. daily.  2. Zaroxolyn 2.5 mg p.o. daily.  3. Norvasc 10 mg p.o. daily.  4. Crestor 40 mg p.o. daily.  5. Zetia 10 mg p.o. daily.  6. Aspirin 325 mg p.o. daily.  7. Flomax 0.4 mg p.o. daily.  8. Potassium 12 mEq 4 p.o. b.i.d.  9. Zithromax.  10.Lasix 80 mg p.o. daily.  11.He also takes Coreg 3.125 mg p.o. b.i.d.  12.Coumadin.   PHYSICAL EXAMINATION:  VITAL SIGNS:  Blood pressure of 100/70.  His  pulse is 85.  HEENT:  Normal.  NECK:  Supple.  CHEST:  Clear.  CARDIOVASCULAR:  An irregular rhythm.  ABDOMEN:  No tenderness.  EXTREMITIES:  1+ edema.   Electrocardiogram shows  atrial fibrillation at a rate of 65.  There is  left bundle-branch block.   DIAGNOSES:  1. Pedal edema - this has been a longstanding problem for Mr.      Callanan, but appears to be worse recently.  He will continue on      his Lasix and Zaroxolyn.  I have asked him to take additional Lasix      40 mg p.o. daily p.r.n.  We also discussed keeping his feet      elevated.  I will check a BMET today to follow his potassium and      renal function.  I have asked him to discontinue his Norvasc to see      if this is contributing as well.  We will see him back in 4 weeks      to make sure that his blood pressure is controlled and if not we      will add  additional medications.  2. Atrial fibrillation - he will continue on his Coreg as well as his      Coumadin.  3. Cardiomyopathy - he will continue his ACE inhibitor and Coreg.  4. Hypertension - his blood pressure is controlled and we will track      his after discontinuing his Norvasc.  5. Hyperlipidemia - he will continue his statin.  This is being      followed by his primary care physician.  6. Remote history of peptic ulcers.  7. History of bradycardia, on atenolol.  8. Osteoarthritis.   He will come back in 4 weeks.     Madolyn Frieze Jens Som, MD, [facility]  Electronically Signed    BSC/MedQ  DD: 02/22/2009  DT: 02/23/2009  Job #: [id number]

## 2011-04-24 NOTE — Assessment & Plan Note (Signed)
[facility]                            CARDIOLOGY OFFICE NOTE   NAME:[name], [name]                  MRN:          [id number]  DATE:04/06/2008                            DOB:          02-Dec-1929    Mr. Kiraly is a 77 year old male who I am asked to evaluate for atrial  fibrillation.  Note, I did see the patient back in 2005, secondary to  what appeared to be orthostatic hypertension.  We adjusted his  medications and he did improve.  Note, he did have a Myoview on October 20, 2004.  There was left ventricular dilatation and an ejection  fraction of 45%.  There was a basilar inferolateral infarction with  minimal superimposed ischemia.  At that time, we scheduled him for  cardiac catheterization which was performed on December 12, 2004.  He had  no significant coronary disease, and his ejection fraction was 40%.  His  most recent echocardiogram was performed on December 15, 2004.  At that  time, his LV function was felt to be normal.  I have not seen the  patient since March 2006.  Note, I have reviewed his chart and  electrocardiogram on February 14, 2007, showed atrial fibrillation.  A  previous electrocardiogram had shown sinus rhythm in March 2006.  The  patient was recently seen by Dr. Everardo All and noted again to be in atrial  fibrillation and cardiology was asked to further evaluate.  Note, he  denies any dyspnea on exertion, orthopnea, PND, palpitations,  presyncope, syncope or chest pain.  He has had occasional pedal edema  which he takes diuretics for.   MEDICATIONS:  1. Lisinopril 40 mg p.o. daily.  2. Zaroxolyn 2.5 mg p.o. daily p.r.n.  3. Norvasc 10 mg p.o. daily.  4. Crestor 40 mg p.o. daily.  5. Zetia 10 mg p.o. daily.  6. Aspirin 325 mg p.o. daily.  7. Flomax 0.4 mg p.o. b.i.d.  8. Potassium 20 mEq tablets four times daily.   ALLERGIES:  NO KNOWN DRUG ALLERGIES.   SOCIAL HISTORY:  He does not smoke, nor does he consume  alcohol.   FAMILY HISTORY:  Negative for coronary artery disease.   PAST MEDICAL HISTORY:  Significant for hypertension and hyperlipidemia.  There is no diabetes mellitus.  He has a history of osteoarthritis.  He  has had a prior right knee replacement, as well as back surgery.   REVIEW OF SYSTEMS:  He denies any headaches, fevers or chills.  There is  no productive cough or hemoptysis.  There is no dysphagia, odynophagia,  melena or hematochezia.  There is no dysuria or hematuria.  There is no  rashes or seizure activity.  There is no orthopnea or PND.  He does  occasionally have pedal edema.  There is no claudication.  He has some  pain in his left knee with ambulation.  The remaining systems are  negative.   PHYSICAL EXAMINATION:  VITAL SIGNS:  Today, shows a blood pressure of  124/72 and his pulse is 79.  He weighs 259 pounds.  GENERAL:  He is well-developed and well-nourished in  no acute distress.  SKIN:  Warm and dry.  He does not appear to be depressed.  There is no  peripheral clubbing.  BACK:  Normal.  HEENT:  Normal with normal eyelids.  NECK:  Supple with a normal upstroke bilaterally.  No bruits noted.  There is no jugulovenous distention and no thyromegaly is noted.  CHEST:  Clear to auscultation with normal expansion.  CARDIOVASCULAR:  Reveals an irregular rhythm.  I cannot appreciate  murmurs, rubs or gallops.  ABDOMEN:  Nontender, nondistended.  Positive  bowel sounds.  No hepatosplenomegaly.  No masses appreciated.  There is  no abdominal bruit.  EXTREMITIES:  He has 2+ femoral pulses bilaterally.  No bruits.  His  extremities show no cords palpated.  There is to 2+ ankle edema  bilaterally.  His distal pulses are difficult to palpate due to his  edema.  NEUROLOGIC:  Grossly intact.   His electrocardiogram shows atrial fibrillation at a rate of 84.  There  is a left bundle branch block.  I also have laboratories drawn from  March 16, 2008.  His hemoglobin and  hematocrit were 13.9 and 43.6  respectively.  Sodium was 140 with potassium of 4.1.  BUN and creatinine  were 16 and 0.8.  Liver functions were normal.  TSH was normal at 1.44.   DIAGNOSES:  1. Atrial fibrillation - the duration of this is unclear.  However, an      electrocardiogram from approximately 1-year ago also showed atrial      fibrillation.  I therefore think it is most likely permanent.  His      rate appears to be adequately controlled on no AV nodal blocking      agents and he has had a history of bradycardia.  We will schedule      him to have a 48-hour Holter monitor to make sure that his rate is      controlled over 24 hours.  He also has embolic risk factors of age      greater than 20, as well as hypertension.  I have asked him to      discontinue his aspirin and we will begin Coumadin with a goal INR      of 2-3, and this will be followed in our Coumadin Clinic.  We will      plan to repeat his echocardiogram as well.  Note, his recent      thyroid-stimulating hormone was normal.  We will see him back in 4-      6 weeks to review his studies and adjust his medications.  2. Hypertension - his blood pressure is adequately controlled on his      present medications.  3. Hyperlipidemia - managed per Dr. Everardo All.  4. Remote history of peptic ulcer disease.  5. History of bradycardia on atenolol.  6. Osteoarthritis - he may require left knee replacement in the      future.  Note, he did have a catheterization in 2006, that showed      no coronary disease.     Madolyn Frieze Jens Som, MD, [facility]  Electronically Signed   BSC/MedQ  DD: 04/06/2008  DT: 04/06/2008  Job #: [id number]   cc:   Gregary Signs A. Everardo All, MD

## 2011-04-24 NOTE — Assessment & Plan Note (Signed)
[facility]                            CARDIOLOGY OFFICE NOTE   NAME:[name], [name]                  MRN:          [id number]  DATE:08/11/2008                            DOB:          11-May-1929    [name] is a very pleasant gentleman who I have followed for atrial  fibrillation as well as presumed nonischemic cardiomyopathy.  Please  refer to my previous notes for details.  I last saw him on May 17, 2008.  Since that time, he denies any dyspnea, chest pain, palpitations, or  syncope.  However, he does state that his pedal edema has worsened.  He  has began taking Lasix 80 mg p.o. daily as compared to previous he was  taking this as needed.  He also states in the past several days despite  this his edema has continued and he took 160 mg.  His edema has now  improved.   His medications include:  1. Lisinopril 40 mg p.o. daily.  2. Lasix 80 mg p.o. daily as needed.  3. Norvasc 10 mg p.o. daily.  4. Crestor 40 mg p.o. daily.  5. Zetia 10 mg p.o. daily.  6. Flomax 0.4 mg p.o. b.i.d.  7. Potassium 20 mEq four times daily.  8. Coumadin as directed.  9. Carvedilol 3.125 mg p.o. b.i.d.   Physical exam today shows a blood pressure of 108/65 and his pulse is  63.  He weighs 250 pounds.  His HEENT is normal.  His neck is supple.  His chest is clear.  His cardiovascular exam reveals an irregular  rhythm.  His abdominal exam shows no tenderness.  Extremities show trace  edema.   His electrocardiogram shows atrial fibrillation at a rate of 64.  There  is occasional PVCs or aberrantly conducted beats.  There is a left  bundle-branch block.  Note, the left bundle was old.   DIAGNOSES:  1. Pedal edema - This has been a longstanding problem but appears to      be worse recently.  He has increased his Lasix.  I have asked him      to resume at 80 mg p.o. daily p.r.n..  I have also discussed a low-      sodium diet and keeping his feet elevated.  He  could also try a      compression hose.  We will need to check a BMET today given his      recent increased diuretic use.  Certainly, some of this may be      related to Norvasc and I have asked him to decrease that to 5 mg      p.o. daily to see if this helps.  I am also concerned about his      cardiomyopathy and atrial fibrillation potentially contributing.      We will check a BMP as well.  2. Atrial fibrillation - His heart rate is adequately controlled.  He      will continue Coumadin with a goal INR of 2-3 as well as his Coreg.      Note, a previous Holter monitor  showed that his rate is adequately      controlled.  3. Cardiomyopathy - He will continue on his ACE inhibitor as well as      his low-dose Coreg.  I cannot advance this, as his heart rate is      borderline.  4. Hypertension - His blood pressure is adequately controlled, in      fact, it is running somewhat low.  We are decreasing his Norvasc.  5. Hyperlipidemia - He will continue on his statin and this is being      followed by Dr. Everardo All.  6. Remote history of peptic ulcer disease.  7. History of bradycardia, on atenolol.  8. Osteoarthritis.   He will see me back in 6 months or sooner if necessary.     Madolyn Frieze Jens Som, MD, [facility]  Electronically Signed    BSC/MedQ  DD: 08/11/2008  DT: 08/12/2008  Job #: [id number]

## 2011-04-24 NOTE — Assessment & Plan Note (Signed)
[facility]                            CARDIOLOGY OFFICE NOTE   NAME:Collings, NAYDEN CZAJKA                  MRN:          [id number]  DATE:05/17/2008                            DOB:          [birth year]    Mr. Smigiel is a 77 year old gentleman that I recently saw on April 06, 2008.  He was referred back to me for atrial fibrillation.  Note, he did  have a cardiac catheterization performed on December 12, 2004 that showed  no significant coronary artery disease and an ejection fraction of 40%.  When I saw him, it was noted that he had had an electrocardiogram from  March 2008 that showed atrial fibrillation and we felt that his atrial  fibrillation was most likely permanent.  The patient did have a followup  echocardiogram performed on Apr 13, 2008.  An ejection fraction was felt  to be 45%.  There was dyssynergy of the lateral wall and septum and  septal and apical hypokinesis.  There was mild mitral regurgitation.  There was mild tricuspid regurgitation.  The left atrium was mildly  dilated.  We subsequently scheduled him to have a Myoview, which was  performed on May 04, 2008.  His ejection fraction was thought to be 30%,  but note the patient was in atrial fibrillation.  There was a prior  inferior scar with mild peri-infarct ischemia.  Finally, we scheduled  him to have a Holter monitor.  His rate appeared to be adequately  control.  We did place him on Coreg 3.125 mg p.o. b.i.d. given his  reduced LV function.  Since then, he denies any dyspnea, chest pain,  palpitations or syncope.  He does occasionally have pedal edema, but he  takes  Zaroxolyn as needed which controls this.   MEDICATIONS:  At present include:  1. Lisinopril 40 mg p.o. daily.  2. Zaroxolyn 2.5 mg p.o. daily.  3. Norvasc 10 mg p.o. daily.  4. Crestor 40 mg p.o. daily.  5. Zetia 10 mg p.o. daily.  6. Flomax 0.4 mg p.o. b.i.d.  7. Potassium 20 mEq 4 times daily.  8. Coumadin as  directed.  9. Carvedilol 3.125 mg p.o. b.i.d.   PHYSICAL EXAM:  Today shows a blood pressure  of 119/71 and his pulse is  46.  He weighs 255 pounds.  HEENT:  Normal.  NECK:  Supple.  CHEST:  Clear.  CARDIOVASCULAR:  Irregular rhythm.  ABDOMINAL EXAM:  Shows no tenderness.  EXTREMITIES:  Show 1+ ankle edema.   DIAGNOSIS:  1. Atrial fibrillation:  This has been present for over 1 year based      on electrocardiograms.  He is not having symptoms from this.  His      rate appears to be adequately controlled.  We will continue with      his low-dose Coreg as well as Coumadin with a goal INR of 2 to 3.      Note, he has embolic risk factors of age greater than 52 as well as      hypertension.  Also note that a previous TSH in April of  this year      was normal at 1.44.  2. Cardiomyopathy:  This was present previously in 2006.  Note, at      that time his coronaries were normal.  His ejection fraction on the      echocardiogram is 45%.  His Myoview shows a prior inferior infarct      with mild ischemia.  However,  he is having no chest pain and I do      not think we need to pursue catheterization at this point.  We will      therefore continue with medical therapy including his ACE inhibitor      and low-dose Coreg.  I cannot advance his Coreg as his heart rate      is borderline at present.  3. Hypertension:  His blood pressure is adequately controlled.  4. Hyperlipidemia:  He will continue on his statin and Dr. Everardo All is      following this.  5. Remote history of peptic ulcer disease.  6. History of bradycardia:  On atenolol.  7. Osteoarthritis.   We will see him back in 3 months.     Madolyn Frieze Jens Som, MD, [facility]  Electronically Signed    BSC/MedQ  DD: 05/17/2008  DT: 05/17/2008  Job #: [id number]

## 2011-04-27 NOTE — Cardiovascular Report (Signed)
[name], [name]           ACCOUNT NO.:  [id number]   MEDICAL RECORD NO.:  [id number]          PATIENT TYPE:  OIB   LOCATION:  6501                         FACILITY:  [facility]   PHYSICIAN:  Arvilla Meres, M.D. LHCDATE OF BIRTH:  [birth year]   DATE OF PROCEDURE:  12/12/2004  DATE OF DISCHARGE:                              CARDIAC CATHETERIZATION   PRIMARY CARE PHYSICIAN:  Sean A. Everardo All, M.D. [facility]   CARDIOLOGIST:  Olga Millers, M.D. [facility]   INDICATIONS:  [name] is a delightful 77 year old male with a history  of hypertension, hyperlipidemia and bradycardia who was referred for  diagnostic cardiac catheterization after having an abnormal adenosine  Cardiolite.   PROCEDURE:  1.  Selective coronary angiography.  2.  Left heart catheterization.  3.  Left ventriculogram.   DESCRIPTION OF PROCEDURE:  The risks and benefits of the procedure were  described to [name].  Consent was signed and placed on the chart.  The  right groin was prepped and draped in routine sterile fashion.  Subsequently, the area was anesthetized with 1% local lidocaine.  A 4 French  arterial sheath was placed in the right femoral artery using the modified  Seldinger technique.  A JL5, JR4 and bent pigtail were used for the  procedure.  All catheter exchanges were made over a wire.  There were no  apparent complications.   FINDINGS:  Hemodynamic aortic pressure was 110/58 with a mean of 79, LV was  120 with an LVEDP of 5.  There was no gradient on aortic valve wall pull-  back.   Left main:  A long vessel angiographically normal.   LAD:  Long vessel wrapping the apex, it gives off a large D1.  There is no  angiographic CAD.   Left circumflex:  Gives off a tiny ramus and made up primarily of a large  branching OM1.  There is no significant coronary disease.   Right coronary artery:  This is a dominant, very large vessel.  There is a  normal PDA and 3 PL's.  There were minor irregularities  distally.   Left ventriculogram:  Done in the RAO approach showed a dilated ventricle  which was not well opacified with contrast injection.  EF appeared to be  approximately 40% although this was difficult to assess.  There was  appointment global hypokinesis and no significant mitral regurgitation.   CONCLUSION:  [name] appears to have a nonischemic cardiomyopathy of  unclear etiology.  One might consider cardiac MRI to further evaluate for  possible infiltrative process versus scar to explain the regional scar or  wall motion abnormalities seen on his functional study.      Dani   DB/MEDQ  D:  12/12/2004  T:  12/12/2004  Job:  [id number]   cc:   Gregary Signs A. Everardo All, M.D. [facility]   Olga Millers, M.D. [facility]

## 2011-06-12 ENCOUNTER — Encounter: Payer: Self-pay | Admitting: Cardiology

## 2011-07-04 ENCOUNTER — Encounter (INDEPENDENT_AMBULATORY_CARE_PROVIDER_SITE_OTHER): Payer: Medicare Other

## 2011-07-04 DIAGNOSIS — R0989 Other specified symptoms and signs involving the circulatory and respiratory systems: Secondary | ICD-10-CM

## 2011-07-10 ENCOUNTER — Encounter: Payer: Self-pay | Admitting: Cardiology

## 2011-08-09 ENCOUNTER — Other Ambulatory Visit: Payer: Self-pay | Admitting: Endocrinology

## 2011-08-10 ENCOUNTER — Encounter: Payer: Self-pay | Admitting: Cardiology

## 2011-08-10 ENCOUNTER — Ambulatory Visit (INDEPENDENT_AMBULATORY_CARE_PROVIDER_SITE_OTHER): Payer: Medicare Other | Admitting: Cardiology

## 2011-08-10 DIAGNOSIS — I10 Essential (primary) hypertension: Secondary | ICD-10-CM

## 2011-08-10 DIAGNOSIS — I4891 Unspecified atrial fibrillation: Secondary | ICD-10-CM

## 2011-08-10 DIAGNOSIS — R609 Edema, unspecified: Secondary | ICD-10-CM

## 2011-08-10 DIAGNOSIS — I428 Other cardiomyopathies: Secondary | ICD-10-CM

## 2011-08-10 DIAGNOSIS — E785 Hyperlipidemia, unspecified: Secondary | ICD-10-CM

## 2011-08-10 LAB — BASIC METABOLIC PANEL
CO2: 26 mEq/L (ref 19–32)
Calcium: 8.9 mg/dL (ref 8.4–10.5)
Chloride: 107 mEq/L (ref 96–112)
Glucose, Bld: 111 mg/dL — ABNORMAL HIGH (ref 70–99)
Potassium: 3.3 mEq/L — ABNORMAL LOW (ref 3.5–5.1)
Sodium: 139 mEq/L (ref 135–145)

## 2011-08-10 LAB — CBC WITH DIFFERENTIAL/PLATELET
Basophils Relative: 0.4 % (ref 0.0–3.0)
Eosinophils Absolute: 0.1 10*3/uL (ref 0.0–0.7)
HCT: 39.8 % (ref 39.0–52.0)
Hemoglobin: 13.2 g/dL (ref 13.0–17.0)
Lymphocytes Relative: 24.6 % (ref 12.0–46.0)
Lymphs Abs: 1.3 10*3/uL (ref 0.7–4.0)
MCHC: 33.2 g/dL (ref 30.0–36.0)
MCV: 93.6 fl (ref 78.0–100.0)
Neutro Abs: 3.5 10*3/uL (ref 1.4–7.7)
RBC: 4.25 Mil/uL (ref 4.22–5.81)

## 2011-08-10 NOTE — Assessment & Plan Note (Signed)
Continue present dose of diuretics. 

## 2011-08-10 NOTE — Assessment & Plan Note (Signed)
Continue statin. 

## 2011-08-10 NOTE — Assessment & Plan Note (Signed)
Blood pressure mildly elevated. However he checks this at home and it typically is controlled. Follow adjust accordingly.

## 2011-08-10 NOTE — Assessment & Plan Note (Addendum)
Continue present dose of beta blocker and Engage study drug. Check CBC.

## 2011-08-10 NOTE — Progress Notes (Signed)
HPI:  [name] is a pleasant gentleman with a history of nonischemic cardiomyopathy as well as atrial fibrillation.  Note, he had a catheterization in January 2006 that showed no coronary artery disease and an ejection fraction of 40%.  He had a Myoview performed last on May 04, 2008, that showed an ejection fraction of 31%.  There was felt to be a prior inferior infarct with mild peri-infarct ischemia.  I did review  this and felt there was a low risk and we have been treating medically. He does have permanent atrial fibrillation as well. His last echocardiogram was performed on 5/ 7/10. It revealed an EF of 35-40%. Mild regurgitation. Holter monitor in August of 2011 showed mildly reduced rate and we decreased his Coreg. I last saw him in Feb 2012. Since then, the patient denies any dyspnea on exertion, orthopnea, PND,  palpitations, syncope or chest pain. He has chronic pedal edema.  Current Outpatient Prescriptions  Medication Sig Dispense Refill  . acetaminophen (TYLENOL) 325 MG tablet Take 650 mg by mouth every 6 (six) hours as needed.        [name] allopurinol (ZYLOPRIM) 100 MG tablet Take 100 mg by mouth daily.        . carvedilol (COREG) 6.25 MG tablet Take 3.125 mg by mouth 2 (two) times daily with a meal.        . furosemide (LASIX) 80 MG tablet Take 80 mg by mouth daily.        [name] lisinopril (PRINIVIL,ZESTRIL) 40 MG tablet TAKE 1 TABLET BY MOUTH EVERY DAY  90 tablet  0  . metolazone (ZAROXOLYN) 2.5 MG tablet TAKE 1 TABLET BY MOUTH EVERY DAY  90 tablet  0  . NON FORMULARY ENGAGE STUDY DRUG       . potassium chloride 40 MEQ/15ML (20%) LIQD Take 40 mEq by mouth 3 (three) times daily.        . pravastatin (PRAVACHOL) 40 MG tablet Take 40 mg by mouth daily.        . Tamsulosin HCl (FLOMAX) 0.4 MG CAPS Take 0.4 mg by mouth 2 (two) times daily.           Past Medical History  Diagnosis Date  . CHF (congestive heart failure)   . HTN (hypertension)   . OA (osteoarthritis)   . BPH (benign  prostatic hyperplasia)   . Obesity   . Anemia   . Diabetes mellitus type II   . Atrial fibrillation   . Cardiomyopathy   . Hyperlipidemia   . Peptic ulcer   . Bradycardia   . History of colonoscopy     Past Surgical History  Procedure Date  . L-spine 1965  . Total knee arthroplasty 1997    right  . Lumbar l4-5 & s1 02/2000  . Esophagogastroduodenoscopy 06/24/2002    History   Social History  . Marital Status: Married    Spouse Name: N/A    Number of Children: N/A  . Years of Education: N/A   Occupational History  . Retired    Social History Main Topics  . Smoking status: Never Smoker   . Smokeless tobacco: Not on file  . Alcohol Use: No  . Drug Use: No  . Sexually Active: Not on file   Other Topics Concern  . Not on file   Social History Narrative  . No narrative on file    ROS: Left knee pain but no fevers or chills, productive cough, hemoptysis, dysphasia, odynophagia, melena, hematochezia,  dysuria, hematuria, rash, seizure activity, orthopnea, PND, claudication. Remaining systems are negative.  Physical Exam: Well-developed well-nourished in no acute distress.  Skin is warm and dry.  HEENT is normal.  Neck is supple. No thyromegaly.  Chest is clear to auscultation with normal expansion.  Cardiovascular exam is irregular Abdominal exam nontender or distended. No masses palpated. Extremities show 1+ edema. neuro grossly intact  ECG atrial fibrillation at a rate of 59. Left bundle branch block.

## 2011-08-10 NOTE — Assessment & Plan Note (Signed)
Continue ACE inhibitor and beta blocker. Cannot advance beta blocker secondary to bradycardia. Euvolemic on examination. Continue present dose of Lasix. Check potassium and renal function.

## 2011-08-10 NOTE — Patient Instructions (Addendum)
Your physician recommends that you have lab work today: bmp/cbc  Your physician wants you to follow-up in: 6 months. You will receive a reminder letter in the mail two months in advance. If you don't receive a letter, please call our office to schedule the follow-up appointment.  Your physician recommends that you continue on your current medications as directed. Please refer to the Current Medication list given to you today.

## 2011-08-14 ENCOUNTER — Telehealth: Payer: Self-pay | Admitting: Cardiology

## 2011-08-14 DIAGNOSIS — E876 Hypokalemia: Secondary | ICD-10-CM

## 2011-08-14 NOTE — Telephone Encounter (Signed)
Pt's wife rtn call from last week

## 2011-08-14 NOTE — Telephone Encounter (Signed)
Spoke with pt, aware of labs results. He will increase potassium to tid, has only been taking bid. He will come in 2 weeks for repeat labs Deliah Goody

## 2011-08-20 ENCOUNTER — Other Ambulatory Visit: Payer: Self-pay | Admitting: Endocrinology

## 2011-08-28 ENCOUNTER — Other Ambulatory Visit (INDEPENDENT_AMBULATORY_CARE_PROVIDER_SITE_OTHER): Payer: Medicare Other | Admitting: *Deleted

## 2011-08-28 DIAGNOSIS — E876 Hypokalemia: Secondary | ICD-10-CM

## 2011-08-28 LAB — BASIC METABOLIC PANEL
Chloride: 103 mEq/L (ref 96–112)
GFR: 94.62 mL/min (ref >60.00)
Glucose, Bld: 122 mg/dL — ABNORMAL HIGH (ref 70–99)
Potassium: 3.3 mEq/L — ABNORMAL LOW (ref 3.5–5.1)
Sodium: 139 mEq/L (ref 135–145)

## 2011-08-30 ENCOUNTER — Telehealth: Payer: Self-pay | Admitting: Cardiology

## 2011-08-30 DIAGNOSIS — E876 Hypokalemia: Secondary | ICD-10-CM

## 2011-08-30 DIAGNOSIS — E875 Hyperkalemia: Secondary | ICD-10-CM

## 2011-08-30 NOTE — Telephone Encounter (Signed)
Pt wife calling wanting to speak to nurse about pt blood. Pt wife is returning call from yesterday. Please return pt call to discuss further.

## 2011-08-30 NOTE — Telephone Encounter (Signed)
PT'S  WIFE AWARE  OF LAB RESULTS./CY 

## 2011-09-10 ENCOUNTER — Other Ambulatory Visit: Payer: Self-pay | Admitting: Endocrinology

## 2011-09-10 ENCOUNTER — Telehealth: Payer: Self-pay | Admitting: *Deleted

## 2011-09-10 NOTE — Telephone Encounter (Signed)
Immunization report-[facility]  Vaccine Administered: Fluvirin Multidose Vial Dose: 0.5 mL Injection Route/Site: IM Left arm Manufacturer: [employer] Date Administered: 09/06/2011 Lot #: [id number] Exp Date: 05/29/2012

## 2011-09-13 ENCOUNTER — Other Ambulatory Visit (INDEPENDENT_AMBULATORY_CARE_PROVIDER_SITE_OTHER): Payer: Medicare Other | Admitting: *Deleted

## 2011-09-13 ENCOUNTER — Encounter: Payer: Self-pay | Admitting: Cardiology

## 2011-09-13 DIAGNOSIS — E876 Hypokalemia: Secondary | ICD-10-CM

## 2011-09-13 LAB — BASIC METABOLIC PANEL
BUN: 20 mg/dL (ref 6–23)
CO2: 26 mEq/L (ref 19–32)
Calcium: 8.8 mg/dL (ref 8.4–10.5)
Chloride: 106 mEq/L (ref 96–112)
GFR: 92.05 mL/min (ref >60.00)
Glucose, Bld: 111 mg/dL — ABNORMAL HIGH (ref 70–99)

## 2011-10-10 ENCOUNTER — Telehealth: Payer: Self-pay | Admitting: Cardiology

## 2011-10-10 NOTE — Telephone Encounter (Signed)
They want to discuss increasing his potassium

## 2011-10-11 ENCOUNTER — Telehealth: Payer: Self-pay | Admitting: Cardiology

## 2011-10-11 MED ORDER — POTASSIUM CHLORIDE 40 MEQ/15ML (20%) PO LIQD: 40.0000 meq | Freq: Four times a day (QID) | ORAL | Status: DC

## 2011-10-11 NOTE — Telephone Encounter (Signed)
Spoke with pt wife, aware script at the pharm for pick up Deliah Goody

## 2011-10-11 NOTE — Telephone Encounter (Signed)
Pt's dtr calling back re new rx for increase in potassium , she went to [facility] on [address] three times and not yet there, can it be called in today?

## 2011-10-11 NOTE — Telephone Encounter (Signed)
Pt's wife returning your call about potassium She said she has called you yesterday

## 2011-10-15 ENCOUNTER — Other Ambulatory Visit: Payer: Self-pay | Admitting: Endocrinology

## 2011-10-26 ENCOUNTER — Encounter: Payer: Self-pay | Admitting: Gastroenterology

## 2011-11-08 ENCOUNTER — Other Ambulatory Visit: Payer: Self-pay | Admitting: Endocrinology

## 2011-12-05 ENCOUNTER — Other Ambulatory Visit: Payer: Self-pay | Admitting: Endocrinology

## 2011-12-13 ENCOUNTER — Other Ambulatory Visit: Payer: Self-pay | Admitting: Endocrinology

## 2011-12-28 ENCOUNTER — Ambulatory Visit: Payer: Medicare Other | Admitting: Cardiology

## 2012-01-02 ENCOUNTER — Ambulatory Visit (INDEPENDENT_AMBULATORY_CARE_PROVIDER_SITE_OTHER): Payer: Medicare Other | Admitting: Cardiology

## 2012-01-02 ENCOUNTER — Encounter: Payer: Self-pay | Admitting: Cardiology

## 2012-01-02 VITALS — BP 112/64 | HR 73 | Ht 71.0 in | Wt 260.0 lb

## 2012-01-02 DIAGNOSIS — I4891 Unspecified atrial fibrillation: Secondary | ICD-10-CM

## 2012-01-02 DIAGNOSIS — R609 Edema, unspecified: Secondary | ICD-10-CM

## 2012-01-02 DIAGNOSIS — I10 Essential (primary) hypertension: Secondary | ICD-10-CM

## 2012-01-02 DIAGNOSIS — I428 Other cardiomyopathies: Secondary | ICD-10-CM | POA: Diagnosis not present

## 2012-01-02 DIAGNOSIS — E785 Hyperlipidemia, unspecified: Secondary | ICD-10-CM

## 2012-01-02 MED ORDER — RIVAROXABAN 20 MG PO TABS: 20.0000 mg | ORAL_TABLET | Freq: Every day | ORAL | Status: DC

## 2012-01-02 NOTE — Assessment & Plan Note (Signed)
Management per primary care. 

## 2012-01-02 NOTE — Patient Instructions (Signed)
Your physician wants you to follow-up in: 6 MONTHS You will receive a reminder letter in the mail two months in advance. If you don't receive a letter, please call our office to schedule the follow-up appointment.   Your physician recommends that you return for lab work in: Monday 01-07-12 AFTER 8:30AM  TAKE EXTRA 80 MG OF FUROSEMIDE AT 1 PM DAILY FOR THE NEXT THREE DAYS  START XARELTO 20 MG ONCE DAILY ONLY AFTER FINISHED WITH STUDY DRUG

## 2012-01-02 NOTE — Assessment & Plan Note (Signed)
Continue ACE inhibitor. I cannot advance beta blocker given history of bradycardia.

## 2012-01-02 NOTE — Assessment & Plan Note (Signed)
Continue beta blocker and study drug. His study drug is due to end. Check renal function and hemoglobin early next week. If renal function stable replace study drug with xeralto 20 mg daily.

## 2012-01-02 NOTE — Progress Notes (Signed)
HPI:[name] is a pleasant gentleman with a history of nonischemic cardiomyopathy as well as atrial fibrillation. Note, he had a catheterization in January 2006 that showed no coronary artery disease and an ejection fraction of 40%. He had a Myoview performed last on May 04, 2008, that showed an ejection fraction of 31%. There was felt to be a prior inferior infarct with mild peri-infarct ischemia. I did review this and felt there was a low risk and we have been treating medically. He does have permanent atrial fibrillation as well. His last echocardiogram was performed on 5/ 7/10. It revealed an EF of 35-40%. Mild regurgitation. Holter monitor in August of 2011 showed mildly reduced rate and we decreased his Coreg. I last saw him in August 2012. Since then, he denies dyspnea on exertion, orthopnea, PND, chest pain or syncope. No bleeding. He has had worsening pedal edema. Note this is a chronic issue but worse recently.   Current Outpatient Prescriptions  Medication Sig Dispense Refill  . acetaminophen (TYLENOL) 325 MG tablet Take 650 mg by mouth every 6 (six) hours as needed.        [name] allopurinol (ZYLOPRIM) 100 MG tablet TAKE 1 TABLET BY MOUTH DAILY  30 tablet  0  . carvedilol (COREG) 6.25 MG tablet Take 3.125 mg by mouth 2 (two) times daily with a meal.        . furosemide (LASIX) 80 MG tablet TAKE 1 TABLET BY MOUTH EVERY DAY  90 tablet  0  . lisinopril (PRINIVIL,ZESTRIL) 40 MG tablet TAKE 1 TABLET BY MOUTH DAILY  90 tablet  0  . metolazone (ZAROXOLYN) 2.5 MG tablet TAKE 1 TABLET BY MOUTH DAILY  30 tablet  0  . NON FORMULARY ENGAGE STUDY DRUG       . potassium chloride 40 MEQ/15ML (20%) LIQD Take 11.25 mEq by mouth 4 (four) times daily.      . pravastatin (PRAVACHOL) 40 MG tablet Take 40 mg by mouth daily.        . Tamsulosin HCl (FLOMAX) 0.4 MG CAPS TAKE ONE CAPSULE BY MOUTH TWICE DAILY  60 capsule  0     Past Medical History  Diagnosis Date  . CHF (congestive heart failure)   . HTN  (hypertension)   . OA (osteoarthritis)   . BPH (benign prostatic hyperplasia)   . Obesity   . Anemia   . Diabetes mellitus type II   . Atrial fibrillation   . Cardiomyopathy   . Hyperlipidemia   . Peptic ulcer   . Bradycardia   . History of colonoscopy     Past Surgical History  Procedure Date  . L-spine 1965  . Total knee arthroplasty 1997    right  . Lumbar l4-5 & s1 02/2000  . Esophagogastroduodenoscopy 06/24/2002    History   Social History  . Marital Status: Married    Spouse Name: N/A    Number of Children: N/A  . Years of Education: N/A   Occupational History  . Retired    Social History Main Topics  . Smoking status: Never Smoker   . Smokeless tobacco: Not on file  . Alcohol Use: No  . Drug Use: No  . Sexually Active: Not on file   Other Topics Concern  . Not on file   Social History Narrative  . No narrative on file    ROS: Left knee pain but no fevers or chills, productive cough, hemoptysis, dysphasia, odynophagia, melena, hematochezia, dysuria, hematuria, rash, seizure activity, orthopnea,  PND, claudication. Remaining systems are negative.  Physical Exam: Well-developed well-nourished in no acute distress.  Skin is warm and dry.  HEENT is normal.  Neck is supple. No thyromegaly.  Chest is clear to auscultation with normal expansion.  Cardiovascular exam is irregular Abdominal exam nontender or distended. No masses palpated. Extremities show 3+ edema to mid tibia neuro grossly intact  ECG atrial fibrillation at a rate of 73. Left bundle branch block.

## 2012-01-02 NOTE — Assessment & Plan Note (Signed)
Blood pressure controlled. Continue present medications. 

## 2012-01-02 NOTE — Assessment & Plan Note (Signed)
Patient with worsening edema some of which is chronic. Increase Lasix to 80 mg twice a day for 3 days and then resume previous dose. Check potassium and renal function in 5 days.

## 2012-01-07 ENCOUNTER — Ambulatory Visit (INDEPENDENT_AMBULATORY_CARE_PROVIDER_SITE_OTHER): Payer: Medicare Other | Admitting: *Deleted

## 2012-01-07 DIAGNOSIS — I4891 Unspecified atrial fibrillation: Secondary | ICD-10-CM

## 2012-01-07 DIAGNOSIS — E876 Hypokalemia: Secondary | ICD-10-CM

## 2012-01-07 LAB — BASIC METABOLIC PANEL
CO2: 33 mEq/L — ABNORMAL HIGH (ref 19–32)
Chloride: 99 mEq/L (ref 96–112)
Creatinine, Ser: 1.3 mg/dL (ref 0.4–1.5)
Potassium: 3.1 mEq/L — ABNORMAL LOW (ref 3.5–5.1)

## 2012-01-07 LAB — CBC WITH DIFFERENTIAL/PLATELET
Basophils Relative: 0.4 % (ref 0.0–3.0)
Eosinophils Absolute: 0.3 10*3/uL (ref 0.0–0.7)
Eosinophils Relative: 3.7 % (ref 0.0–5.0)
HCT: 39.4 % (ref 39.0–52.0)
Hemoglobin: 13.3 g/dL (ref 13.0–17.0)
Lymphs Abs: 1.5 10*3/uL (ref 0.7–4.0)
MCHC: 33.6 g/dL (ref 30.0–36.0)
MCV: 93.9 fl (ref 78.0–100.0)
Monocytes Absolute: 0.9 10*3/uL (ref 0.1–1.0)
Neutro Abs: 5.1 10*3/uL (ref 1.4–7.7)
Neutrophils Relative %: 64.9 % (ref 43.0–77.0)
RBC: 4.2 Mil/uL — ABNORMAL LOW (ref 4.22–5.81)

## 2012-01-10 ENCOUNTER — Other Ambulatory Visit: Payer: Self-pay | Admitting: Endocrinology

## 2012-01-21 ENCOUNTER — Ambulatory Visit (INDEPENDENT_AMBULATORY_CARE_PROVIDER_SITE_OTHER): Payer: Medicare Other | Admitting: *Deleted

## 2012-01-21 DIAGNOSIS — E785 Hyperlipidemia, unspecified: Secondary | ICD-10-CM

## 2012-01-21 DIAGNOSIS — I252 Old myocardial infarction: Secondary | ICD-10-CM | POA: Diagnosis not present

## 2012-01-21 DIAGNOSIS — I10 Essential (primary) hypertension: Secondary | ICD-10-CM

## 2012-01-21 LAB — BASIC METABOLIC PANEL
CO2: 29 mEq/L (ref 19–32)
Chloride: 101 mEq/L (ref 96–112)
Glucose, Bld: 132 mg/dL — ABNORMAL HIGH (ref 70–99)
Potassium: 3.3 mEq/L — ABNORMAL LOW (ref 3.5–5.1)
Sodium: 137 mEq/L (ref 135–145)

## 2012-01-30 ENCOUNTER — Encounter: Payer: Self-pay | Admitting: Cardiology

## 2012-01-30 ENCOUNTER — Encounter (INDEPENDENT_AMBULATORY_CARE_PROVIDER_SITE_OTHER): Payer: Medicare Other

## 2012-01-30 DIAGNOSIS — R0989 Other specified symptoms and signs involving the circulatory and respiratory systems: Secondary | ICD-10-CM

## 2012-01-31 ENCOUNTER — Telehealth: Payer: Self-pay | Admitting: *Deleted

## 2012-01-31 DIAGNOSIS — E876 Hypokalemia: Secondary | ICD-10-CM

## 2012-01-31 NOTE — Telephone Encounter (Signed)
Message copied by Freddi Starr on Thu Jan 31, 2012  3:31 PM ------      Message from: Lewayne Bunting      Created: Mon Jan 21, 2012  7:52 PM       bmet 2 weeks      Olga Millers

## 2012-02-06 ENCOUNTER — Other Ambulatory Visit: Payer: Self-pay | Admitting: Endocrinology

## 2012-02-06 ENCOUNTER — Ambulatory Visit: Payer: Medicare Other | Admitting: Cardiology

## 2012-02-06 ENCOUNTER — Other Ambulatory Visit: Payer: Self-pay | Admitting: Cardiology

## 2012-02-11 ENCOUNTER — Other Ambulatory Visit (INDEPENDENT_AMBULATORY_CARE_PROVIDER_SITE_OTHER): Payer: Medicare Other

## 2012-02-11 DIAGNOSIS — E876 Hypokalemia: Secondary | ICD-10-CM | POA: Diagnosis not present

## 2012-02-11 LAB — BASIC METABOLIC PANEL
BUN: 18 mg/dL (ref 6–23)
CO2: 27 mEq/L (ref 19–32)
Chloride: 103 mEq/L (ref 96–112)
Creatinine, Ser: 0.9 mg/dL (ref 0.4–1.5)
Glucose, Bld: 124 mg/dL — ABNORMAL HIGH (ref 70–99)
Potassium: 3.6 mEq/L (ref 3.5–5.1)

## 2012-02-22 ENCOUNTER — Telehealth: Payer: Self-pay | Admitting: Pharmacist

## 2012-02-22 NOTE — Telephone Encounter (Signed)
Message copied by Velda Shell on Fri Feb 22, 2012  5:18 PM ------      Message from: Loletha Carrow D      Created: Wed Feb 20, 2012  5:09 PM       Patient started on Coumadin 5 mg qd on 01/31/12.            EOS INR 01/30/12 2.5      4-6 days INR 02/04/12 2.5      7-10days INR 02/07/12 2.9      11-14 INR 02/11/12 3.0

## 2012-02-25 ENCOUNTER — Ambulatory Visit (INDEPENDENT_AMBULATORY_CARE_PROVIDER_SITE_OTHER): Payer: Medicare Other | Admitting: *Deleted

## 2012-02-25 DIAGNOSIS — Z7901 Long term (current) use of anticoagulants: Secondary | ICD-10-CM | POA: Diagnosis not present

## 2012-02-25 DIAGNOSIS — I4891 Unspecified atrial fibrillation: Secondary | ICD-10-CM | POA: Diagnosis not present

## 2012-02-25 LAB — POCT INR: INR: 3.2

## 2012-03-04 ENCOUNTER — Telehealth: Payer: Self-pay | Admitting: Cardiology

## 2012-03-04 ENCOUNTER — Other Ambulatory Visit (INDEPENDENT_AMBULATORY_CARE_PROVIDER_SITE_OTHER): Payer: Medicare Other

## 2012-03-04 ENCOUNTER — Other Ambulatory Visit: Payer: Self-pay | Admitting: *Deleted

## 2012-03-04 DIAGNOSIS — D696 Thrombocytopenia, unspecified: Secondary | ICD-10-CM

## 2012-03-04 LAB — CBC WITH DIFFERENTIAL/PLATELET
Basophils Absolute: 0 10*3/uL (ref 0.0–0.1)
HCT: 39.2 % (ref 39.0–52.0)
Hemoglobin: 12.9 g/dL — ABNORMAL LOW (ref 13.0–17.0)
Lymphs Abs: 1.5 10*3/uL (ref 0.7–4.0)
MCV: 94.8 fl (ref 78.0–100.0)
Monocytes Absolute: 0.6 10*3/uL (ref 0.1–1.0)
Monocytes Relative: 11.5 % (ref 3.0–12.0)
Neutro Abs: 3 10*3/uL (ref 1.4–7.7)
Platelets: 135 10*3/uL — ABNORMAL LOW (ref 150.0–400.0)
RDW: 15.3 % — ABNORMAL HIGH (ref 11.5–14.6)

## 2012-03-04 MED ORDER — POTASSIUM CHLORIDE 40 MEQ/15ML (20%) PO LIQD: 40.0000 meq | Freq: Four times a day (QID) | ORAL | Status: DC

## 2012-03-04 NOTE — Telephone Encounter (Signed)
New Msg: Pt wife calling wanting to speak with nurse/regarding pt having to increase his potassium. Pt drug store did not increase amount given. Therefore pt needs new RX with right amount sent to drug store.   Please return pt wife call to discuss further.

## 2012-03-05 ENCOUNTER — Telehealth: Payer: Self-pay | Admitting: *Deleted

## 2012-03-05 NOTE — Telephone Encounter (Signed)
I spoke to patient's wife to let her know platelet count was okay per Dr. Ludwig Clarks note. She verbalized understanding. Patient has completed Engage Study. Patient will follow-up with [facility] for management of atrial fib and follow-up with Dr. Jens Som as scheduled.

## 2012-03-10 ENCOUNTER — Ambulatory Visit (INDEPENDENT_AMBULATORY_CARE_PROVIDER_SITE_OTHER): Payer: Medicare Other | Admitting: *Deleted

## 2012-03-10 ENCOUNTER — Other Ambulatory Visit: Payer: Self-pay | Admitting: Endocrinology

## 2012-03-10 ENCOUNTER — Other Ambulatory Visit: Payer: Self-pay | Admitting: Cardiology

## 2012-03-10 DIAGNOSIS — I4891 Unspecified atrial fibrillation: Secondary | ICD-10-CM

## 2012-03-10 DIAGNOSIS — Z7901 Long term (current) use of anticoagulants: Secondary | ICD-10-CM

## 2012-03-10 LAB — POCT INR: INR: 2.4

## 2012-03-13 ENCOUNTER — Telehealth: Payer: Self-pay | Admitting: Cardiology

## 2012-03-13 NOTE — Telephone Encounter (Signed)
Spoke with pharm, questions regarding potassium answered.

## 2012-03-13 NOTE — Telephone Encounter (Signed)
New Msg: Pharmacy calling wanting to speak with nurse with question regarding potassium. Please return pt call to discuss further.

## 2012-04-07 ENCOUNTER — Ambulatory Visit (INDEPENDENT_AMBULATORY_CARE_PROVIDER_SITE_OTHER): Payer: Medicare Other

## 2012-04-07 ENCOUNTER — Other Ambulatory Visit: Payer: Self-pay | Admitting: Endocrinology

## 2012-04-07 DIAGNOSIS — Z7901 Long term (current) use of anticoagulants: Secondary | ICD-10-CM | POA: Diagnosis not present

## 2012-04-07 DIAGNOSIS — I4891 Unspecified atrial fibrillation: Secondary | ICD-10-CM | POA: Diagnosis not present

## 2012-04-07 LAB — POCT INR: INR: 2.5

## 2012-04-11 ENCOUNTER — Ambulatory Visit (INDEPENDENT_AMBULATORY_CARE_PROVIDER_SITE_OTHER): Payer: Medicare Other | Admitting: Endocrinology

## 2012-04-11 ENCOUNTER — Other Ambulatory Visit: Payer: Self-pay | Admitting: Endocrinology

## 2012-04-11 ENCOUNTER — Other Ambulatory Visit (INDEPENDENT_AMBULATORY_CARE_PROVIDER_SITE_OTHER): Payer: Medicare Other

## 2012-04-11 VITALS — BP 138/84 | HR 71 | Temp 98.0°F | Ht 71.0 in | Wt 247.0 lb

## 2012-04-11 DIAGNOSIS — E1129 Type 2 diabetes mellitus with other diabetic kidney complication: Secondary | ICD-10-CM

## 2012-04-11 DIAGNOSIS — E119 Type 2 diabetes mellitus without complications: Secondary | ICD-10-CM | POA: Diagnosis not present

## 2012-04-11 DIAGNOSIS — R7989 Other specified abnormal findings of blood chemistry: Secondary | ICD-10-CM | POA: Diagnosis not present

## 2012-04-11 DIAGNOSIS — D649 Anemia, unspecified: Secondary | ICD-10-CM | POA: Diagnosis not present

## 2012-04-11 DIAGNOSIS — Z79899 Other long term (current) drug therapy: Secondary | ICD-10-CM

## 2012-04-11 DIAGNOSIS — Z125 Encounter for screening for malignant neoplasm of prostate: Secondary | ICD-10-CM | POA: Insufficient documentation

## 2012-04-11 DIAGNOSIS — R609 Edema, unspecified: Secondary | ICD-10-CM

## 2012-04-11 DIAGNOSIS — I4891 Unspecified atrial fibrillation: Secondary | ICD-10-CM | POA: Diagnosis not present

## 2012-04-11 DIAGNOSIS — E785 Hyperlipidemia, unspecified: Secondary | ICD-10-CM

## 2012-04-11 DIAGNOSIS — IMO0001 Reserved for inherently not codable concepts without codable children: Secondary | ICD-10-CM

## 2012-04-11 DIAGNOSIS — Z0000 Encounter for general adult medical examination without abnormal findings: Secondary | ICD-10-CM | POA: Diagnosis not present

## 2012-04-11 DIAGNOSIS — D696 Thrombocytopenia, unspecified: Secondary | ICD-10-CM

## 2012-04-11 LAB — URINALYSIS, ROUTINE W REFLEX MICROSCOPIC
Bilirubin Urine: NEGATIVE
Leukocytes, UA: NEGATIVE
Nitrite: NEGATIVE
Specific Gravity, Urine: 1.015 (ref 1.000–1.030)
pH: 6 (ref 5.0–8.0)

## 2012-04-11 LAB — CBC WITH DIFFERENTIAL/PLATELET
Basophils Absolute: 0.1 10*3/uL (ref 0.0–0.1)
Lymphocytes Relative: 21.5 % (ref 12.0–46.0)
Monocytes Relative: 8.6 % (ref 3.0–12.0)
Neutrophils Relative %: 65.1 % (ref 43.0–77.0)
Platelets: 153 10*3/uL (ref 150.0–400.0)
RDW: 14.2 % (ref 11.5–14.6)
WBC: 5.8 10*3/uL (ref 4.5–10.5)

## 2012-04-11 LAB — TSH: TSH: 1.27 u[IU]/mL (ref 0.35–5.50)

## 2012-04-11 LAB — BASIC METABOLIC PANEL
CO2: 29 mEq/L (ref 19–32)
Chloride: 103 mEq/L (ref 96–112)
Glucose, Bld: 104 mg/dL — ABNORMAL HIGH (ref 70–99)
Potassium: 3.6 mEq/L (ref 3.5–5.1)
Sodium: 142 mEq/L (ref 135–145)

## 2012-04-11 LAB — HEPATIC FUNCTION PANEL
Albumin: 3.5 g/dL (ref 3.5–5.2)
Total Protein: 6.5 g/dL (ref 6.0–8.3)

## 2012-04-11 LAB — MICROALBUMIN / CREATININE URINE RATIO
Creatinine,U: 6 mg/dL
Microalb, Ur: 0.1 mg/dL (ref 0.0–1.9)

## 2012-04-11 LAB — IBC PANEL
Saturation Ratios: 22.9 % (ref 20.0–50.0)
Transferrin: 187.5 mg/dL — ABNORMAL LOW (ref 212.0–360.0)

## 2012-04-11 LAB — LIPID PANEL: VLDL: 10.6 mg/dL (ref 0.0–40.0)

## 2012-04-11 LAB — PSA: PSA: 1.73 ng/mL (ref 0.10–4.00)

## 2012-04-11 LAB — HEMOGLOBIN A1C: Hgb A1c MFr Bld: 6.5 % (ref 4.6–6.5)

## 2012-04-11 NOTE — Patient Instructions (Addendum)
please consider these measures for your health:  minimize alcohol.  do not use tobacco products.  have a colonoscopy at least every 10 years from age 78.  keep firearms safely stored.  always use seat belts.  have working smoke alarms in your home.  see an eye doctor and dentist regularly.  never drive under the influence of alcohol or drugs (including prescription drugs).  those with fair skin should take precautions against the sun. please let me know what your wishes would be, if artificial life support measures should become necessary.  it is critically important to prevent falling down (keep floor areas well-lit, dry, and free of loose objects.  If you have a cane, walker, or wheelchair, you should use it, even for short trips around the house.  Also, try not to rush). blood tests are being requested for you today.  You will receive a letter with results. Please come back for a follow-up appointment in 6 months You should have a vaccine against shingles (a painful rash which results from the  chickenpox infection which most people had many years ago).  This vaccine reduces, but does not totally eliminate the risk of shingles.  Because this is a medicare part d benefit, you should get it at a pharmacy.   (update: we discussed code status.  pt requests full code, but would not want to be started or maintained on artificial life-support measures if there was not a reasonable chance of recovery)

## 2012-04-11 NOTE — Progress Notes (Signed)
Subjective:    Patient ID: [name], male    DOB: 04-05-1929, 78 y.o.   MRN: [id number]  HPI The state of at least three ongoing medical problems is addressed today: 1 hepatic transaminase is noted to be elevated.  It has been intermittently elevated in the past.  Denies sob DM: denies weight change Dyslipidemia: denies chest pain Past Medical History  Diagnosis Date  . CHF (congestive heart failure)   . HTN (hypertension)   . OA (osteoarthritis)   . BPH (benign prostatic hyperplasia)   . Obesity   . Anemia   . Diabetes mellitus type II   . Atrial fibrillation   . Cardiomyopathy   . Hyperlipidemia   . Peptic ulcer   . Bradycardia   . History of colonoscopy     Past Surgical History  Procedure Date  . L-spine 1965  . Total knee arthroplasty 1997    right  . Lumbar l4-5 & s1 02/2000  . Esophagogastroduodenoscopy 06/24/2002    History   Social History  . Marital Status: Married    Spouse Name: N/A    Number of Children: N/A  . Years of Education: N/A   Occupational History  . Retired    Social History Main Topics  . Smoking status: Never Smoker   . Smokeless tobacco: Not on file  . Alcohol Use: No  . Drug Use: No  . Sexually Active: Not on file   Other Topics Concern  . Not on file   Social History Narrative  . No narrative on file    Current Outpatient Prescriptions on File Prior to Visit  Medication Sig Dispense Refill  . acetaminophen (TYLENOL) 325 MG tablet Take 650 mg by mouth every 6 (six) hours as needed.        [name] allopurinol (ZYLOPRIM) 100 MG tablet TAKE 1 TABLET BY MOUTH DAILY  30 tablet  0  . carvedilol (COREG) 6.25 MG tablet TAKE 1/2 TABLET BY MOUTH TWICE DAILY  30 tablet  11  . furosemide (LASIX) 80 MG tablet TAKE 1 TABLET BY MOUTH EVERY DAY  90 tablet  0  . lisinopril (PRINIVIL,ZESTRIL) 40 MG tablet TAKE 1 TABLET BY MOUTH DAILY  30 tablet  0  . metolazone (ZAROXOLYN) 2.5 MG tablet TAKE 1 TABLET BY MOUTH EVERY DAY  30 tablet  0  .  pravastatin (PRAVACHOL) 40 MG tablet TAKE 1 TABLET BY MOUTH EVERY NIGHT AT BEDTIME  30 tablet  11  . Rivaroxaban (XARELTO) 20 MG TABS Take 20 mg by mouth daily.  30 tablet  12  . Tamsulosin HCl (FLOMAX) 0.4 MG CAPS TAKE 1 CAPSULE BY MOUTH TWICE DAILY  60 capsule  0  . warfarin (COUMADIN) 5 MG tablet         No Known Allergies  Family History  Problem Relation Age of Onset  . Cancer Neg Hx   . Heart disease Father   . Heart disease Mother   . Diabetes Mother   . Hypertension Brother   . Hypertension Brother     BP 138/84  Pulse 71  Temp(Src) 98 F (36.7 C) (Oral)  Ht 5\' 11"  (1.803 m)  Wt 247 lb (112.038 kg)  BMI 34.45 kg/m2  SpO2 97%   Review of Systems  Gastrointestinal: Negative for anal bleeding.  Genitourinary: Negative for hematuria and difficulty urinating.  Neurological: Negative for syncope.  Psychiatric/Behavioral:       Mild depression, due to the recent death of his brother  Objective:   Physical Exam VITAL SIGNS:  See vs page GENERAL: no distress NECK: There is no palpable thyroid enlargement.  No thyroid nodule is palpable.  No palpable lymphadenopathy at the anterior neck. Pulses: dorsalis pedis intact bilat.   Feet: no deformity.  no ulcer on the feet.  feet are of normal color and temp.  1+ bilat leg edema.  bilat varicosities.  bilat rust discoloration of the legs.  Heavy callus on the plantar aspect of the right foot, under the MTP joint.   Neuro: sensation is intact to touch on the feet     Lab Results  Component Value Date   WBC 5.8 04/11/2012   HGB 13.1 04/11/2012   HCT 39.5 04/11/2012   PLT 153.0 04/11/2012   GLUCOSE 104* 04/11/2012   CHOL 173 04/11/2012   TRIG 53.0 04/11/2012   HDL 57.80 04/11/2012   LDLCALC 105* 04/11/2012   ALT 40 04/11/2012   AST 42* 04/11/2012   NA 142 04/11/2012   K 3.6 04/11/2012   CL 103 04/11/2012   CREATININE 0.9 04/11/2012   BUN 26* 04/11/2012   CO2 29 04/11/2012   TSH 1.27 04/11/2012   PSA 1.73 04/11/2012   INR 2.5 04/07/2012    HGBA1C 6.5 04/11/2012   MICROALBUR 0.1 04/11/2012      Assessment & Plan:  elev hepatic transaminase, recurrent DM.  well-controlled on no med Dyslipidemia: needs increased rx    Subjective:   Patient here for Medicare annual wellness visit and management of other chronic and acute problems.     Risk factors: advanced age    Roster of Physicians Providing Medical Care to Patient:  See "snapshot"   Activities of Daily Living: In your present state of health, do you have any difficulty performing the following activities?:  Preparing food and eating?: No  Bathing yourself: No  Getting dressed: No  Using the toilet:No  Moving around from place to place: No  In the past year have you fallen or had a near fall?: No    Home Safety: Has smoke detector and wears seat belts. Firearms are safely stored. No excess sun exposure.  Diet and Exercise  Current exercise habits: pt says he does the best he can with a cane.  He still mows the grass, and helps with housework.  Dietary issues discussed: pt reports a healthy diet   Depression Screen  Q1: Over the past two weeks, have you felt down, depressed or hopeless? no  Q2: Over the past two weeks, have you felt little interest or pleasure in doing things? no   The following portions of the patient's history were reviewed and updated as appropriate: allergies, current medications, past family history, past medical history, past social history, past surgical history and problem list.  Past Medical History  Diagnosis Date  . CHF (congestive heart failure)   . HTN (hypertension)   . OA (osteoarthritis)   . BPH (benign prostatic hyperplasia)   . Obesity   . Anemia   . Diabetes mellitus type II   . Atrial fibrillation   . Cardiomyopathy   . Hyperlipidemia   . Peptic ulcer   . Bradycardia   . History of colonoscopy     Past Surgical History  Procedure Date  . L-spine 1965  . Total knee arthroplasty 1997    right  . Lumbar l4-5 & s1  02/2000  . Esophagogastroduodenoscopy 06/24/2002    History   Social History  . Marital Status: Married  Spouse Name: N/A    Number of Children: N/A  . Years of Education: N/A   Occupational History  . Retired    Social History Main Topics  . Smoking status: Never Smoker   . Smokeless tobacco: Not on file  . Alcohol Use: No  . Drug Use: No  . Sexually Active: Not on file   Other Topics Concern  . Not on file   Social History Narrative  . No narrative on file    Current Outpatient Prescriptions on File Prior to Visit  Medication Sig Dispense Refill  . acetaminophen (TYLENOL) 325 MG tablet Take 650 mg by mouth every 6 (six) hours as needed.        [name] allopurinol (ZYLOPRIM) 100 MG tablet TAKE 1 TABLET BY MOUTH DAILY  30 tablet  0  . carvedilol (COREG) 6.25 MG tablet TAKE 1/2 TABLET BY MOUTH TWICE DAILY  30 tablet  11  . furosemide (LASIX) 80 MG tablet TAKE 1 TABLET BY MOUTH EVERY DAY  90 tablet  0  . lisinopril (PRINIVIL,ZESTRIL) 40 MG tablet TAKE 1 TABLET BY MOUTH DAILY  30 tablet  0  . metolazone (ZAROXOLYN) 2.5 MG tablet TAKE 1 TABLET BY MOUTH EVERY DAY  30 tablet  0  . pravastatin (PRAVACHOL) 40 MG tablet TAKE 1 TABLET BY MOUTH EVERY NIGHT AT BEDTIME  30 tablet  11  . Rivaroxaban (XARELTO) 20 MG TABS Take 20 mg by mouth daily.  30 tablet  12  . Tamsulosin HCl (FLOMAX) 0.4 MG CAPS TAKE 1 CAPSULE BY MOUTH TWICE DAILY  60 capsule  0  . warfarin (COUMADIN) 5 MG tablet         No Known Allergies  Family History  Problem Relation Age of Onset  . Cancer Neg Hx   . Heart disease Father   . Heart disease Mother   . Diabetes Mother   . Hypertension Brother   . Hypertension Brother     BP 138/84  Pulse 71  Temp(Src) 98 F (36.7 C) (Oral)  Ht 5\' 11"  (1.803 m)  Wt 247 lb (112.038 kg)  BMI 34.45 kg/m2  SpO2 97%   Review of Systems  Denies hearing loss, and visual loss Objective:   Vision:  Sees opthalmologist Hearing: grossly normal Body mass index:  See vs  page Msk: pt easily and quickly performs "get-up-and-go" from a sitting position.  He walks with a cane. Cognitive Impairment Assessment: cognition, memory and judgment appear normal.  remembers 3/3 at 5 minutes.  excellent recall.  can easily read and write a sentence.  alert and oriented x 3   Assessment:   Medicare wellness utd on preventive parameters    Plan:   During the course of the visit the patient was educated and counseled about appropriate screening and preventive services including:        Fall prevention   Diabetes screening  Nutrition counseling   Patient Instructions (the written plan) was given to the patient.

## 2012-04-12 ENCOUNTER — Encounter: Payer: Self-pay | Admitting: Endocrinology

## 2012-04-14 ENCOUNTER — Other Ambulatory Visit: Payer: Self-pay | Admitting: *Deleted

## 2012-04-14 ENCOUNTER — Telehealth: Payer: Self-pay

## 2012-04-14 ENCOUNTER — Other Ambulatory Visit: Payer: Self-pay | Admitting: Endocrinology

## 2012-04-14 ENCOUNTER — Telehealth: Payer: Self-pay | Admitting: *Deleted

## 2012-04-14 DIAGNOSIS — K769 Liver disease, unspecified: Secondary | ICD-10-CM | POA: Insufficient documentation

## 2012-04-14 LAB — PTH, INTACT AND CALCIUM
Calcium, Total (PTH): 9.8 mg/dL (ref 8.4–10.5)
PTH: 59.8 pg/mL (ref 14.0–72.0)

## 2012-04-14 MED ORDER — FUROSEMIDE 80 MG PO TABS: ORAL_TABLET | ORAL | Status: DC

## 2012-04-14 MED ORDER — LISINOPRIL 40 MG PO TABS: 40.0000 mg | ORAL_TABLET | Freq: Every day | ORAL | Status: DC

## 2012-04-14 MED ORDER — METOLAZONE 2.5 MG PO TABS: 2.5000 mg | ORAL_TABLET | Freq: Every day | ORAL | Status: DC

## 2012-04-14 MED ORDER — TAMSULOSIN HCL 0.4 MG PO CAPS: 0.4000 mg | ORAL_CAPSULE | Freq: Two times a day (BID) | ORAL | Status: DC

## 2012-04-14 NOTE — Telephone Encounter (Signed)
Called pt to inform of lab results, pt's daughter informed of lab results (letter also mailed to pt).

## 2012-04-14 NOTE — Telephone Encounter (Signed)
Pt's daughter called for refills of father's prescriptions. Informed daughter that all prescriptions have been sent to pharmacy this morning and to check with pharmacy to see if they have been received.

## 2012-04-14 NOTE — Telephone Encounter (Signed)
[facility] Triage Call Report Triage Record Num: [id number] Operator: Reino Bellis Patient Name: Kierre Hintz Call Date & Time: 04/11/2012 6:01:18PM Patient Phone: [phone] PCP: Romero Belling Patient Gender: Male PCP Fax : [phone] Patient DOB: 04-01-1929 [facility] Name: [facility] Reason for Call: Caller: Deborah/Other; PCP: Romero Belling; CB#: [phone]; Call regarding ; Daughter/Deborah calling because pt. saw MD on 04/11/12 and was supposed to have multiple RX (Alopurinol, Lisinopril, Metolazone, Flomax) called in but RX is not at pharmacy. Spoke to [facility] who reports an emergency supply can be given for the weekend. Daughter/Deborah made aware and instructed to follow up with office on 04/14/12 regarding new RX/refill. Daughter verbalizes understanding. Protocol(s) Used: Office Note Recommended Outcome per Protocol: Information Noted and Sent to Office Reason for Outcome: Caller information to office Care Advice: ~ 04/11/2012 6:39:55PM Page 1 of 1 [id number]

## 2012-04-28 ENCOUNTER — Other Ambulatory Visit (INDEPENDENT_AMBULATORY_CARE_PROVIDER_SITE_OTHER): Payer: Medicare Other

## 2012-04-28 ENCOUNTER — Encounter: Payer: Self-pay | Admitting: Endocrinology

## 2012-04-28 DIAGNOSIS — K769 Liver disease, unspecified: Secondary | ICD-10-CM | POA: Diagnosis not present

## 2012-04-28 LAB — HEPATIC FUNCTION PANEL
AST: 21 U/L (ref 0–37)
Albumin: 3.8 g/dL (ref 3.5–5.2)
Total Bilirubin: 0.8 mg/dL (ref 0.3–1.2)

## 2012-04-29 ENCOUNTER — Telehealth: Payer: Self-pay | Admitting: *Deleted

## 2012-04-29 LAB — HEPATITIS C ANTIBODY: HCV Ab: NEGATIVE

## 2012-04-29 LAB — HEPATITIS B SURFACE ANTIGEN: Hepatitis B Surface Ag: NEGATIVE

## 2012-04-29 NOTE — Telephone Encounter (Signed)
Called pt to inform of lab results, pt informed (letter also mailed to pt). 

## 2012-05-06 ENCOUNTER — Ambulatory Visit (INDEPENDENT_AMBULATORY_CARE_PROVIDER_SITE_OTHER): Payer: Medicare Other | Admitting: Pharmacist

## 2012-05-06 DIAGNOSIS — I4891 Unspecified atrial fibrillation: Secondary | ICD-10-CM

## 2012-05-06 DIAGNOSIS — Z7901 Long term (current) use of anticoagulants: Secondary | ICD-10-CM

## 2012-05-20 ENCOUNTER — Other Ambulatory Visit: Payer: Self-pay | Admitting: Cardiology

## 2012-05-20 ENCOUNTER — Other Ambulatory Visit: Payer: Self-pay

## 2012-05-20 ENCOUNTER — Other Ambulatory Visit: Payer: Self-pay | Admitting: Endocrinology

## 2012-06-03 ENCOUNTER — Ambulatory Visit (INDEPENDENT_AMBULATORY_CARE_PROVIDER_SITE_OTHER): Payer: Medicare Other | Admitting: *Deleted

## 2012-06-03 DIAGNOSIS — I4891 Unspecified atrial fibrillation: Secondary | ICD-10-CM | POA: Diagnosis not present

## 2012-06-03 DIAGNOSIS — Z7901 Long term (current) use of anticoagulants: Secondary | ICD-10-CM | POA: Diagnosis not present

## 2012-06-03 LAB — POCT INR: INR: 2.3

## 2012-06-20 ENCOUNTER — Other Ambulatory Visit: Payer: Self-pay | Admitting: Cardiology

## 2012-07-08 ENCOUNTER — Ambulatory Visit (INDEPENDENT_AMBULATORY_CARE_PROVIDER_SITE_OTHER): Payer: Medicare Other | Admitting: Cardiology

## 2012-07-08 ENCOUNTER — Encounter: Payer: Self-pay | Admitting: Cardiology

## 2012-07-08 VITALS — BP 134/85 | HR 61 | Wt 237.0 lb

## 2012-07-08 DIAGNOSIS — I428 Other cardiomyopathies: Secondary | ICD-10-CM

## 2012-07-08 DIAGNOSIS — I4891 Unspecified atrial fibrillation: Secondary | ICD-10-CM

## 2012-07-08 DIAGNOSIS — E785 Hyperlipidemia, unspecified: Secondary | ICD-10-CM | POA: Diagnosis not present

## 2012-07-08 DIAGNOSIS — I10 Essential (primary) hypertension: Secondary | ICD-10-CM

## 2012-07-08 DIAGNOSIS — R609 Edema, unspecified: Secondary | ICD-10-CM

## 2012-07-08 DIAGNOSIS — I429 Cardiomyopathy, unspecified: Secondary | ICD-10-CM

## 2012-07-08 LAB — BASIC METABOLIC PANEL
CO2: 30 mEq/L (ref 19–32)
Chloride: 100 mEq/L (ref 96–112)
Potassium: 3.5 mEq/L (ref 3.5–5.1)

## 2012-07-08 LAB — CBC WITH DIFFERENTIAL/PLATELET
Basophils Relative: 0.4 % (ref 0.0–3.0)
Eosinophils Absolute: 0.2 10*3/uL (ref 0.0–0.7)
Eosinophils Relative: 2.8 % (ref 0.0–5.0)
HCT: 41.9 % (ref 39.0–52.0)
Lymphs Abs: 1.7 10*3/uL (ref 0.7–4.0)
MCHC: 33.3 g/dL (ref 30.0–36.0)
MCV: 93.5 fl (ref 78.0–100.0)
Monocytes Absolute: 0.8 10*3/uL (ref 0.1–1.0)
Neutro Abs: 4.6 10*3/uL (ref 1.4–7.7)
RBC: 4.49 Mil/uL (ref 4.22–5.81)
WBC: 7.4 10*3/uL (ref 4.5–10.5)

## 2012-07-08 NOTE — Assessment & Plan Note (Signed)
Blood pressure controlled. Continue present medications. 

## 2012-07-08 NOTE — Patient Instructions (Addendum)
Your physician wants you to follow-up in: 6 MONTHS WITH DR CRENSHAW You will receive a reminder letter in the mail two months in advance. If you don't receive a letter, please call our office to schedule the follow-up appointment.   Your physician has requested that you have an echocardiogram. Echocardiography is a painless test that uses sound waves to create images of your heart. It provides your doctor with information about the size and shape of your heart and how well your heart's chambers and valves are working. This procedure takes approximately one hour. There are no restrictions for this procedure.   Your physician recommends that you HAVE LAB WORK TODAY 

## 2012-07-08 NOTE — Assessment & Plan Note (Signed)
Continue beta blocker and Coumadin. Check hemoglobin. 

## 2012-07-08 NOTE — Assessment & Plan Note (Addendum)
Continue ACE inhibitor and beta blocker. Check potassium and renal function. Repeat echocardiogram.

## 2012-07-08 NOTE — Progress Notes (Signed)
HPI: Pleasant gentleman with a history of nonischemic cardiomyopathy as well as atrial fibrillation for fu. Note, he had a catheterization in January 2006 that showed no coronary artery disease and an ejection fraction of 40%. He had a Myoview performed last on May 04, 2008, that showed an ejection fraction of 31%. There was felt to be a prior inferior infarct with mild peri-infarct ischemia. I did review this and felt there was a low risk and we have been treating medically. He does have permanent atrial fibrillation as well. His last echocardiogram was performed on 5/ 7/10. It revealed an EF of 35-40%. Mild regurgitation. Holter monitor in August of 2011 showed mildly reduced rate and we decreased his Coreg. I last saw him in Jan 2013. Since then, he denies dyspnea on exertion, orthopnea, PND, chest pain or syncope. No bleeding. He has chronic pedal edema which is controlled at present.   Current Outpatient Prescriptions  Medication Sig Dispense Refill  . acetaminophen (TYLENOL) 325 MG tablet Take 650 mg by mouth every 6 (six) hours as needed.        [name] allopurinol (ZYLOPRIM) 100 MG tablet TAKE 1 TABLET BY MOUTH EVERY DAY  30 tablet  11  . carvedilol (COREG) 6.25 MG tablet TAKE 1/2 TABLET BY MOUTH TWICE DAILY  30 tablet  11  . furosemide (LASIX) 80 MG tablet TAKE 1 TABLET BY MOUTH EVERY DAY  30 tablet  11  . lisinopril (PRINIVIL,ZESTRIL) 40 MG tablet TAKE 1 TABLET BY MOUTH DAILY  30 tablet  11  . metolazone (ZAROXOLYN) 2.5 MG tablet Take 1 tablet (2.5 mg total) by mouth daily.  30 tablet  11  . potassium chloride 40 MEQ/15ML (20%) LIQD Take 15  Ml  By mouth four times a day      . pravastatin (PRAVACHOL) 40 MG tablet TAKE 1 TABLET BY MOUTH EVERY NIGHT AT BEDTIME  30 tablet  11  . Rivaroxaban (XARELTO) 20 MG TABS Take 20 mg by mouth daily.  30 tablet  12  . Tamsulosin HCl (FLOMAX) 0.4 MG CAPS Take 1 capsule (0.4 mg total) by mouth 2 (two) times daily.  60 capsule  11  . warfarin (COUMADIN) 5 MG  tablet Use as directed by [facility]  30 tablet  3  . DISCONTD: lisinopril (PRINIVIL,ZESTRIL) 40 MG tablet Take 1 tablet (40 mg total) by mouth daily.  30 tablet  11     Past Medical History  Diagnosis Date  . CHF (congestive heart failure)   . HTN (hypertension)   . OA (osteoarthritis)   . BPH (benign prostatic hyperplasia)   . Obesity   . Anemia   . Diabetes mellitus type II   . Atrial fibrillation   . Cardiomyopathy   . Hyperlipidemia   . Peptic ulcer   . Bradycardia   . History of colonoscopy     Past Surgical History  Procedure Date  . L-spine 1965  . Total knee arthroplasty 1997    right  . Lumbar l4-5 & s1 02/2000  . Esophagogastroduodenoscopy 06/24/2002    History   Social History  . Marital Status: Married    Spouse Name: N/A    Number of Children: N/A  . Years of Education: N/A   Occupational History  . Retired    Social History Main Topics  . Smoking status: Never Smoker   . Smokeless tobacco: Not on file  . Alcohol Use: No  . Drug Use: No  . Sexually Active: Not on  file   Other Topics Concern  . Not on file   Social History Narrative  . No narrative on file    ROS: no fevers or chills, productive cough, hemoptysis, dysphasia, odynophagia, melena, hematochezia, dysuria, hematuria, rash, seizure activity, orthopnea, PND, pedal edema, claudication. Remaining systems are negative.  Physical Exam: Well-developed well-nourished in no acute distress.  Skin is warm and dry.  HEENT is normal.  Neck is supple.  Chest is clear to auscultation with normal expansion.  Cardiovascular exam is irregular Abdominal exam nontender or distended. No masses palpated. Extremities show 1+ edema. neuro grossly intact  ECG atrial fibrillation at a rate of 61. Left bundle branch block.

## 2012-07-08 NOTE — Assessment & Plan Note (Signed)
Continue statin. Management per primary care. 

## 2012-07-08 NOTE — Assessment & Plan Note (Signed)
Continue present dose of diuretic. 

## 2012-07-11 ENCOUNTER — Other Ambulatory Visit (HOSPITAL_COMMUNITY): Payer: Medicare Other

## 2012-07-15 ENCOUNTER — Ambulatory Visit (INDEPENDENT_AMBULATORY_CARE_PROVIDER_SITE_OTHER): Payer: Medicare Other | Admitting: *Deleted

## 2012-07-15 ENCOUNTER — Ambulatory Visit (HOSPITAL_COMMUNITY): Payer: Medicare Other | Attending: Cardiology | Admitting: Radiology

## 2012-07-15 DIAGNOSIS — I4891 Unspecified atrial fibrillation: Secondary | ICD-10-CM | POA: Insufficient documentation

## 2012-07-15 DIAGNOSIS — E669 Obesity, unspecified: Secondary | ICD-10-CM | POA: Diagnosis not present

## 2012-07-15 DIAGNOSIS — I517 Cardiomegaly: Secondary | ICD-10-CM | POA: Insufficient documentation

## 2012-07-15 DIAGNOSIS — Z7901 Long term (current) use of anticoagulants: Secondary | ICD-10-CM

## 2012-07-15 DIAGNOSIS — I5020 Unspecified systolic (congestive) heart failure: Secondary | ICD-10-CM | POA: Insufficient documentation

## 2012-07-15 DIAGNOSIS — I429 Cardiomyopathy, unspecified: Secondary | ICD-10-CM

## 2012-07-15 DIAGNOSIS — R609 Edema, unspecified: Secondary | ICD-10-CM | POA: Insufficient documentation

## 2012-07-15 DIAGNOSIS — E785 Hyperlipidemia, unspecified: Secondary | ICD-10-CM | POA: Insufficient documentation

## 2012-07-15 DIAGNOSIS — E119 Type 2 diabetes mellitus without complications: Secondary | ICD-10-CM | POA: Insufficient documentation

## 2012-07-15 DIAGNOSIS — I059 Rheumatic mitral valve disease, unspecified: Secondary | ICD-10-CM | POA: Insufficient documentation

## 2012-07-15 DIAGNOSIS — I509 Heart failure, unspecified: Secondary | ICD-10-CM | POA: Diagnosis not present

## 2012-07-15 DIAGNOSIS — I428 Other cardiomyopathies: Secondary | ICD-10-CM | POA: Insufficient documentation

## 2012-07-15 DIAGNOSIS — I10 Essential (primary) hypertension: Secondary | ICD-10-CM | POA: Insufficient documentation

## 2012-07-15 NOTE — Progress Notes (Signed)
Echocardiogram performed.  

## 2012-07-24 ENCOUNTER — Other Ambulatory Visit: Payer: Self-pay | Admitting: Cardiology

## 2012-08-20 ENCOUNTER — Encounter: Payer: Self-pay | Admitting: *Deleted

## 2012-08-26 ENCOUNTER — Ambulatory Visit (INDEPENDENT_AMBULATORY_CARE_PROVIDER_SITE_OTHER): Payer: Medicare Other

## 2012-08-26 DIAGNOSIS — I4891 Unspecified atrial fibrillation: Secondary | ICD-10-CM | POA: Diagnosis not present

## 2012-08-26 DIAGNOSIS — Z7901 Long term (current) use of anticoagulants: Secondary | ICD-10-CM | POA: Diagnosis not present

## 2012-08-27 ENCOUNTER — Encounter: Payer: Self-pay | Admitting: Cardiology

## 2012-08-27 DIAGNOSIS — Z23 Encounter for immunization: Secondary | ICD-10-CM | POA: Diagnosis not present

## 2012-09-05 ENCOUNTER — Ambulatory Visit (INDEPENDENT_AMBULATORY_CARE_PROVIDER_SITE_OTHER): Payer: Medicare Other | Admitting: Cardiology

## 2012-09-05 ENCOUNTER — Encounter: Payer: Self-pay | Admitting: Cardiology

## 2012-09-05 VITALS — BP 141/84 | HR 61 | Wt 244.0 lb

## 2012-09-05 DIAGNOSIS — E785 Hyperlipidemia, unspecified: Secondary | ICD-10-CM | POA: Diagnosis not present

## 2012-09-05 DIAGNOSIS — I428 Other cardiomyopathies: Secondary | ICD-10-CM | POA: Diagnosis not present

## 2012-09-05 DIAGNOSIS — I10 Essential (primary) hypertension: Secondary | ICD-10-CM

## 2012-09-05 DIAGNOSIS — I4891 Unspecified atrial fibrillation: Secondary | ICD-10-CM

## 2012-09-05 DIAGNOSIS — R609 Edema, unspecified: Secondary | ICD-10-CM

## 2012-09-05 NOTE — Assessment & Plan Note (Signed)
Patient has chronic lower extremity edema. Continue present dose of diuretics.

## 2012-09-05 NOTE — Assessment & Plan Note (Signed)
Continue beta-blocker and anticoagulation. ?

## 2012-09-05 NOTE — Assessment & Plan Note (Signed)
Management per primary care. 

## 2012-09-05 NOTE — Assessment & Plan Note (Signed)
Blood pressure controlled. Continue present medication. 

## 2012-09-05 NOTE — Patient Instructions (Addendum)
Your physician wants you to follow-up in: 6 MONTHS WITH DR Jens Som You will receive a reminder letter in the mail two months in advance. If you don't receive a letter, please call our office to schedule the follow-up appointment.   REFERRAL TO EP TO DISCUSS ICD

## 2012-09-05 NOTE — Assessment & Plan Note (Signed)
Patient with history of nonischemic cardiomyopathy.  Recent echo shows ejection fraction 30-35%. Continue ACE inhibitor. I cannot advance his beta blocker because of his heart rate. He is borderline age for ICD. I will ask one of our electrophysiologists to review for consideration of placement for primary prevention.

## 2012-09-05 NOTE — Progress Notes (Signed)
HPI: Pleasant gentleman with a history of nonischemic cardiomyopathy as well as atrial fibrillation for fu. Note, he had a catheterization in January 2006 that showed no coronary artery disease and an ejection fraction of 40%. He had a Myoview performed last on May 04, 2008, that showed an ejection fraction of 31%. There was felt to be a prior inferior infarct with mild peri-infarct ischemia. I did review this and felt there was a low risk and we have been treating medically. He does have permanent atrial fibrillation as well. Holter monitor in August of 2011 showed mildly reduced rate and we decreased his Coreg.   His last echocardiogram was performed in August of 2013. His ejection fraction was 30-35%. There was mild to moderate mitral regurgitation. The left atrium was mild to moderately dilated as was the right atrium. I last saw him in July 2013. Since then, he denies dyspnea on exertion, orthopnea, PND, chest pain or syncope. No bleeding. He has chronic pedal edema which is controlled at present.  Current Outpatient Prescriptions  Medication Sig Dispense Refill  . acetaminophen (TYLENOL) 325 MG tablet Take 650 mg by mouth every 6 (six) hours as needed.        [name] allopurinol (ZYLOPRIM) 100 MG tablet TAKE 1 TABLET BY MOUTH EVERY DAY  30 tablet  11  . carvedilol (COREG) 6.25 MG tablet TAKE 1/2 TABLET BY MOUTH TWICE DAILY  30 tablet  11  . furosemide (LASIX) 80 MG tablet TAKE 1 TABLET BY MOUTH EVERY DAY  30 tablet  11  . lisinopril (PRINIVIL,ZESTRIL) 40 MG tablet TAKE 1 TABLET BY MOUTH DAILY  30 tablet  11  . metolazone (ZAROXOLYN) 2.5 MG tablet Take 1 tablet (2.5 mg total) by mouth daily.  30 tablet  11  . potassium chloride 40 MEQ/15ML (20%) LIQD Take 15  Ml  By mouth four times a day      . potassium chloride 40 MEQ/15ML (20%) LIQD TAKE 15 ML BY MOUTH FOUR TIMES DAILY  1800 mL  1  . pravastatin (PRAVACHOL) 40 MG tablet TAKE 1 TABLET BY MOUTH EVERY NIGHT AT BEDTIME  30 tablet  11  . Tamsulosin  HCl (FLOMAX) 0.4 MG CAPS Take 1 capsule (0.4 mg total) by mouth 2 (two) times daily.  60 capsule  11  . warfarin (COUMADIN) 5 MG tablet Use as directed by [facility]  30 tablet  3     Past Medical History  Diagnosis Date  . CHF (congestive heart failure)   . HTN (hypertension)   . OA (osteoarthritis)   . BPH (benign prostatic hyperplasia)   . Obesity   . Anemia   . Diabetes mellitus type II   . Atrial fibrillation   . Cardiomyopathy   . Hyperlipidemia   . Peptic ulcer   . Bradycardia   . History of colonoscopy     Past Surgical History  Procedure Date  . L-spine 1965  . Total knee arthroplasty 1997    right  . Lumbar l4-5 & s1 02/2000  . Esophagogastroduodenoscopy 06/24/2002    History   Social History  . Marital Status: Married    Spouse Name: N/A    Number of Children: N/A  . Years of Education: N/A   Occupational History  . Retired    Social History Main Topics  . Smoking status: Never Smoker   . Smokeless tobacco: Not on file  . Alcohol Use: No  . Drug Use: No  . Sexually Active: Not on  file   Other Topics Concern  . Not on file   Social History Narrative  . No narrative on file    ROS: knee arthralgias but no fevers or chills, productive cough, hemoptysis, dysphasia, odynophagia, melena, hematochezia, dysuria, hematuria, rash, seizure activity, orthopnea, PND, claudication. Remaining systems are negative.  Physical Exam: Well-developed well-nourished in no acute distress.  Skin is warm and dry.  HEENT is normal.  Neck is supple.   Chest is clear to auscultation with normal expansion.  Cardiovascular exam is irregular Abdominal exam nontender or distended. No masses palpated. Extremities show 2+ ankle edema. neuro grossly intact

## 2012-09-10 ENCOUNTER — Ambulatory Visit (INDEPENDENT_AMBULATORY_CARE_PROVIDER_SITE_OTHER): Payer: Medicare Other | Admitting: Internal Medicine

## 2012-09-10 ENCOUNTER — Encounter: Payer: Self-pay | Admitting: Internal Medicine

## 2012-09-10 VITALS — BP 133/64 | HR 68 | Resp 18 | Ht 71.0 in | Wt 246.6 lb

## 2012-09-10 DIAGNOSIS — I4891 Unspecified atrial fibrillation: Secondary | ICD-10-CM | POA: Diagnosis not present

## 2012-09-10 DIAGNOSIS — I509 Heart failure, unspecified: Secondary | ICD-10-CM | POA: Diagnosis not present

## 2012-09-10 DIAGNOSIS — I252 Old myocardial infarction: Secondary | ICD-10-CM | POA: Diagnosis not present

## 2012-09-11 ENCOUNTER — Encounter: Payer: Self-pay | Admitting: Gastroenterology

## 2012-09-12 ENCOUNTER — Encounter: Payer: Self-pay | Admitting: Internal Medicine

## 2012-09-12 NOTE — Assessment & Plan Note (Signed)
His ventricular rate appears to be well controlled. Will follow.  

## 2012-09-12 NOTE — Assessment & Plan Note (Signed)
His chronic systolic heart failure is class 3. He would be a candidate for either BiV ICD or PPM. I have discussed the options with the patient and his primary cardiologist and plan to proceed with device implant in the next few weeks. I would anticipate uptitration of his beta blocker after device implant.

## 2012-09-12 NOTE — Assessment & Plan Note (Signed)
Note results of both prior cath and stress testing. He has an inferior scar but no obvious obstruction in RCA. Continue current meds.

## 2012-09-12 NOTE — Progress Notes (Signed)
HPI [name] is referred today by Dr. Jens Som for consideration for ICD implant. He is a pleasant 78 yo man with a longstanding chronic systolic heart failure, atrial fibrillation and LBBB. He has class 3 CHF. He has not had syncope. His EF is 30-35% and has been so for 4 years. He has been limited in uptitration of his beta blocker by bradycardia. He has mild peripheral edema. No Known Allergies   Current Outpatient Prescriptions  Medication Sig Dispense Refill  . acetaminophen (TYLENOL) 325 MG tablet Take 650 mg by mouth every 6 (six) hours as needed.        [name] allopurinol (ZYLOPRIM) 100 MG tablet TAKE 1 TABLET BY MOUTH EVERY DAY  30 tablet  11  . carvedilol (COREG) 6.25 MG tablet TAKE 1/2 TABLET BY MOUTH TWICE DAILY  30 tablet  11  . furosemide (LASIX) 80 MG tablet TAKE 1 TABLET BY MOUTH EVERY DAY  30 tablet  11  . lisinopril (PRINIVIL,ZESTRIL) 40 MG tablet TAKE 1 TABLET BY MOUTH DAILY  30 tablet  11  . metolazone (ZAROXOLYN) 2.5 MG tablet Take 1 tablet (2.5 mg total) by mouth daily.  30 tablet  11  . potassium chloride 40 MEQ/15ML (20%) LIQD Take 15  Ml  By mouth four times a day      . potassium chloride 40 MEQ/15ML (20%) LIQD TAKE 15 ML BY MOUTH FOUR TIMES DAILY  1800 mL  1  . pravastatin (PRAVACHOL) 40 MG tablet TAKE 1 TABLET BY MOUTH EVERY NIGHT AT BEDTIME  30 tablet  11  . Tamsulosin HCl (FLOMAX) 0.4 MG CAPS Take 1 capsule (0.4 mg total) by mouth 2 (two) times daily.  60 capsule  11  . warfarin (COUMADIN) 5 MG tablet Use as directed by [facility]  30 tablet  3     Past Medical History  Diagnosis Date  . CHF (congestive heart failure)   . HTN (hypertension)   . OA (osteoarthritis)   . BPH (benign prostatic hyperplasia)   . Obesity   . Anemia   . Diabetes mellitus type II   . Atrial fibrillation   . Cardiomyopathy   . Hyperlipidemia   . Peptic ulcer   . Bradycardia   . History of colonoscopy     ROS:   All systems reviewed and negative except as noted in the  HPI.   Past Surgical History  Procedure Date  . L-spine 1965  . Total knee arthroplasty 1997    right  . Lumbar l4-5 & s1 02/2000  . Esophagogastroduodenoscopy 06/24/2002     Family History  Problem Relation Age of Onset  . Cancer Neg Hx   . Heart disease Father   . Heart disease Mother   . Diabetes Mother   . Hypertension Brother   . Hypertension Brother      History   Social History  . Marital Status: Married    Spouse Name: N/A    Number of Children: N/A  . Years of Education: N/A   Occupational History  . Retired    Social History Main Topics  . Smoking status: Never Smoker   . Smokeless tobacco: Not on file  . Alcohol Use: No  . Drug Use: No  . Sexually Active: Not on file   Other Topics Concern  . Not on file   Social History Narrative  . No narrative on file     BP 133/64  Pulse 68  Resp 18  Ht 5\' 11"  (1.803  m)  Wt 246 lb 9.6 oz (111.857 kg)  BMI 34.39 kg/m2  SpO2 98%  Physical Exam:  stable appearing 78 yo man, NAD HEENT: Unremarkable Neck:  7 cm JVD, no thyromegally Lungs:  Clear with no wheezes or rhonchi. Minimal basilar rales. HEART:  IRegular rate rhythm, no murmurs, no rubs, no clicks, split S2. Abd:  soft, positive bowel sounds, no organomegally, no rebound, no guarding Ext:  2 plus pulses, trace edema, no cyanosis, no clubbing Skin:  No rashes no nodules Neuro:  CN II through XII intact, motor grossly intact  EKG Atrial fibrillation with LBBB.  Assess/Plan:

## 2012-09-22 ENCOUNTER — Telehealth: Payer: Self-pay | Admitting: Internal Medicine

## 2012-09-22 NOTE — Telephone Encounter (Signed)
plz return call to pt wife to discuss f/u for patient. She was suppose to receive a phone call advising her of pt's next move, but has not heard from doctor since 10/2 appnt.

## 2012-09-22 NOTE — Telephone Encounter (Signed)
PT wanting to know the status of PPM implant surgery.  Advised pt that Tresa Endo and Dr. Ladona Ridgel would be in office tomorrow.

## 2012-09-23 ENCOUNTER — Ambulatory Visit: Payer: Medicare Other | Admitting: Internal Medicine

## 2012-09-26 ENCOUNTER — Encounter: Payer: Self-pay | Admitting: *Deleted

## 2012-09-26 ENCOUNTER — Other Ambulatory Visit: Payer: Self-pay | Admitting: *Deleted

## 2012-09-26 DIAGNOSIS — Z01812 Encounter for preprocedural laboratory examination: Secondary | ICD-10-CM

## 2012-09-26 DIAGNOSIS — I509 Heart failure, unspecified: Secondary | ICD-10-CM

## 2012-09-26 DIAGNOSIS — I4891 Unspecified atrial fibrillation: Secondary | ICD-10-CM

## 2012-09-26 NOTE — Telephone Encounter (Signed)
Spoke with patient this morning. He will get labs done on 10/07/12 with his CVRR appointment and his procedure on 10/14/12. He will pick up his letter when he gets his lab work done.

## 2012-09-27 ENCOUNTER — Other Ambulatory Visit: Payer: Self-pay | Admitting: Cardiology

## 2012-09-29 ENCOUNTER — Encounter (HOSPITAL_COMMUNITY): Payer: Self-pay | Admitting: Pharmacy Technician

## 2012-10-07 ENCOUNTER — Other Ambulatory Visit (INDEPENDENT_AMBULATORY_CARE_PROVIDER_SITE_OTHER): Payer: Medicare Other

## 2012-10-07 ENCOUNTER — Ambulatory Visit (INDEPENDENT_AMBULATORY_CARE_PROVIDER_SITE_OTHER): Payer: Medicare Other | Admitting: Pharmacist

## 2012-10-07 DIAGNOSIS — Z7901 Long term (current) use of anticoagulants: Secondary | ICD-10-CM

## 2012-10-07 DIAGNOSIS — I4891 Unspecified atrial fibrillation: Secondary | ICD-10-CM | POA: Diagnosis not present

## 2012-10-07 DIAGNOSIS — I509 Heart failure, unspecified: Secondary | ICD-10-CM

## 2012-10-07 DIAGNOSIS — Z01812 Encounter for preprocedural laboratory examination: Secondary | ICD-10-CM

## 2012-10-07 LAB — BASIC METABOLIC PANEL
Calcium: 9 mg/dL (ref 8.4–10.5)
GFR: 85.94 mL/min (ref >60.00)
Potassium: 3.8 mEq/L (ref 3.5–5.1)
Sodium: 139 mEq/L (ref 135–145)

## 2012-10-07 LAB — CBC WITH DIFFERENTIAL/PLATELET
Basophils Absolute: 0 10*3/uL (ref 0.0–0.1)
Eosinophils Absolute: 0.3 10*3/uL (ref 0.0–0.7)
Eosinophils Relative: 5 % (ref 0.0–5.0)
HCT: 41.3 % (ref 39.0–52.0)
Lymphs Abs: 1.7 10*3/uL (ref 0.7–4.0)
MCHC: 32.1 g/dL (ref 30.0–36.0)
MCV: 94.9 fl (ref 78.0–100.0)
Monocytes Absolute: 0.8 10*3/uL (ref 0.1–1.0)
Neutrophils Relative %: 54.7 % (ref 43.0–77.0)
Platelets: 128 10*3/uL — ABNORMAL LOW (ref 150.0–400.0)
RDW: 14.3 % (ref 11.5–14.6)
WBC: 6.2 10*3/uL (ref 4.5–10.5)

## 2012-10-07 LAB — POCT INR: INR: 2.3

## 2012-10-13 MED ORDER — CEFAZOLIN SODIUM-DEXTROSE 2-3 GM-% IV SOLR
2.0000 g | INTRAVENOUS | Status: DC
  Filled 2012-10-13 (×2): qty 50

## 2012-10-13 MED ORDER — SODIUM CHLORIDE 0.9 % IR SOLN
80.0000 mg | Status: DC
  Filled 2012-10-13: qty 2

## 2012-10-14 ENCOUNTER — Ambulatory Visit (HOSPITAL_COMMUNITY)
Admission: RE | Admit: 2012-10-14 | Discharge: 2012-10-15 | Disposition: A | Discharge status: Home / Self Care | Payer: Medicare Other | Source: Ambulatory Visit | Attending: Internal Medicine | Admitting: Internal Medicine

## 2012-10-14 ENCOUNTER — Ambulatory Visit (HOSPITAL_COMMUNITY): Payer: Medicare Other

## 2012-10-14 ENCOUNTER — Encounter (HOSPITAL_COMMUNITY)
Admission: RE | Disposition: A | Discharge status: Home / Self Care | Payer: Self-pay | Source: Ambulatory Visit | Attending: Internal Medicine

## 2012-10-14 ENCOUNTER — Encounter (HOSPITAL_COMMUNITY): Payer: Self-pay | Admitting: General Practice

## 2012-10-14 DIAGNOSIS — I509 Heart failure, unspecified: Secondary | ICD-10-CM | POA: Insufficient documentation

## 2012-10-14 DIAGNOSIS — E119 Type 2 diabetes mellitus without complications: Secondary | ICD-10-CM | POA: Diagnosis not present

## 2012-10-14 DIAGNOSIS — E785 Hyperlipidemia, unspecified: Secondary | ICD-10-CM | POA: Diagnosis not present

## 2012-10-14 DIAGNOSIS — N400 Enlarged prostate without lower urinary tract symptoms: Secondary | ICD-10-CM | POA: Insufficient documentation

## 2012-10-14 DIAGNOSIS — Z79899 Other long term (current) drug therapy: Secondary | ICD-10-CM

## 2012-10-14 DIAGNOSIS — I5022 Chronic systolic (congestive) heart failure: Secondary | ICD-10-CM | POA: Diagnosis not present

## 2012-10-14 DIAGNOSIS — I498 Other specified cardiac arrhythmias: Secondary | ICD-10-CM

## 2012-10-14 DIAGNOSIS — D696 Thrombocytopenia, unspecified: Secondary | ICD-10-CM

## 2012-10-14 DIAGNOSIS — Z01818 Encounter for other preprocedural examination: Secondary | ICD-10-CM | POA: Diagnosis not present

## 2012-10-14 DIAGNOSIS — R9389 Abnormal findings on diagnostic imaging of other specified body structures: Secondary | ICD-10-CM | POA: Diagnosis not present

## 2012-10-14 DIAGNOSIS — R413 Other amnesia: Secondary | ICD-10-CM

## 2012-10-14 DIAGNOSIS — Z7901 Long term (current) use of anticoagulants: Secondary | ICD-10-CM

## 2012-10-14 DIAGNOSIS — Z95810 Presence of automatic (implantable) cardiac defibrillator: Secondary | ICD-10-CM

## 2012-10-14 DIAGNOSIS — E1129 Type 2 diabetes mellitus with other diabetic kidney complication: Secondary | ICD-10-CM

## 2012-10-14 DIAGNOSIS — I4891 Unspecified atrial fibrillation: Secondary | ICD-10-CM | POA: Insufficient documentation

## 2012-10-14 DIAGNOSIS — I447 Left bundle-branch block, unspecified: Secondary | ICD-10-CM | POA: Diagnosis not present

## 2012-10-14 DIAGNOSIS — R609 Edema, unspecified: Secondary | ICD-10-CM

## 2012-10-14 DIAGNOSIS — I2589 Other forms of chronic ischemic heart disease: Secondary | ICD-10-CM | POA: Diagnosis not present

## 2012-10-14 DIAGNOSIS — D649 Anemia, unspecified: Secondary | ICD-10-CM

## 2012-10-14 DIAGNOSIS — R7989 Other specified abnormal findings of blood chemistry: Secondary | ICD-10-CM

## 2012-10-14 DIAGNOSIS — M1990 Unspecified osteoarthritis, unspecified site: Secondary | ICD-10-CM | POA: Diagnosis not present

## 2012-10-14 DIAGNOSIS — E876 Hypokalemia: Secondary | ICD-10-CM

## 2012-10-14 DIAGNOSIS — Z125 Encounter for screening for malignant neoplasm of prostate: Secondary | ICD-10-CM

## 2012-10-14 DIAGNOSIS — I428 Other cardiomyopathies: Secondary | ICD-10-CM

## 2012-10-14 DIAGNOSIS — K769 Liver disease, unspecified: Secondary | ICD-10-CM

## 2012-10-14 DIAGNOSIS — I252 Old myocardial infarction: Secondary | ICD-10-CM

## 2012-10-14 DIAGNOSIS — I10 Essential (primary) hypertension: Secondary | ICD-10-CM

## 2012-10-14 HISTORY — DX: Presence of automatic (implantable) cardiac defibrillator: Z95.810

## 2012-10-14 HISTORY — DX: Gastro-esophageal reflux disease without esophagitis: K21.9

## 2012-10-14 LAB — PROTIME-INR
INR: 1.5 — ABNORMAL HIGH (ref 0.00–1.49)
Prothrombin Time: 17.7 seconds — ABNORMAL HIGH (ref 11.6–15.2)

## 2012-10-14 LAB — SURGICAL PCR SCREEN: MRSA, PCR: NEGATIVE

## 2012-10-14 SURGERY — BI-VENTRICULAR IMPLANTABLE CARDIOVERTER DEFIBRILLATOR  (CRT-D)
Anesthesia: LOCAL

## 2012-10-14 MED ORDER — MUPIROCIN 2 % EX OINT
TOPICAL_OINTMENT | Freq: Two times a day (BID) | CUTANEOUS | Status: DC
  Administered 2012-10-14: 1 via NASAL
  Filled 2012-10-14: qty 22

## 2012-10-14 MED ORDER — METOLAZONE 2.5 MG PO TABS
2.5000 mg | ORAL_TABLET | Freq: Every day | ORAL | Status: DC
  Filled 2012-10-14: qty 1

## 2012-10-14 MED ORDER — WARFARIN - PHYSICIAN DOSING INPATIENT
Freq: Every day | Status: DC
  Administered 2012-10-14: 18:00:00

## 2012-10-14 MED ORDER — LIDOCAINE HCL (PF) 1 % IJ SOLN
INTRAMUSCULAR | Status: AC
  Filled 2012-10-14: qty 60

## 2012-10-14 MED ORDER — FENTANYL CITRATE 0.05 MG/ML IJ SOLN
INTRAMUSCULAR | Status: AC
  Filled 2012-10-14: qty 2

## 2012-10-14 MED ORDER — WARFARIN SODIUM 2.5 MG PO TABS
2.5000 mg | ORAL_TABLET | ORAL | Status: DC
  Administered 2012-10-14: 18:00:00 2.5 mg via ORAL
  Filled 2012-10-14: qty 1

## 2012-10-14 MED ORDER — MIDAZOLAM HCL 5 MG/5ML IJ SOLN
INTRAMUSCULAR | Status: AC
  Filled 2012-10-14: qty 5

## 2012-10-14 MED ORDER — LISINOPRIL 40 MG PO TABS
40.0000 mg | ORAL_TABLET | Freq: Every day | ORAL | Status: DC
  Administered 2012-10-15: 09:00:00 40 mg via ORAL
  Filled 2012-10-14: qty 1

## 2012-10-14 MED ORDER — SODIUM CHLORIDE 0.9 % IV SOLN
250.0000 mL | INTRAVENOUS | Status: DC
  Administered 2012-10-14: 1000 mL via INTRAVENOUS

## 2012-10-14 MED ORDER — CEFAZOLIN SODIUM-DEXTROSE 2-3 GM-% IV SOLR
2.0000 g | Freq: Four times a day (QID) | INTRAVENOUS | Status: AC
  Administered 2012-10-14 – 2012-10-15 (×3): 2 g via INTRAVENOUS
  Filled 2012-10-14 (×3): qty 50

## 2012-10-14 MED ORDER — SODIUM CHLORIDE 0.9 % IJ SOLN: 3.0000 mL | Freq: Two times a day (BID) | INTRAMUSCULAR | Status: DC

## 2012-10-14 MED ORDER — WARFARIN SODIUM 5 MG PO TABS
5.0000 mg | ORAL_TABLET | ORAL | Status: DC
  Filled 2012-10-14: qty 1

## 2012-10-14 MED ORDER — ONDANSETRON HCL 4 MG/2ML IJ SOLN: 4.0000 mg | Freq: Four times a day (QID) | INTRAMUSCULAR | Status: DC | PRN

## 2012-10-14 MED ORDER — FUROSEMIDE 80 MG PO TABS
80.0000 mg | ORAL_TABLET | Freq: Every day | ORAL | Status: DC
  Filled 2012-10-14: qty 1

## 2012-10-14 MED ORDER — HEPARIN (PORCINE) IN NACL 2-0.9 UNIT/ML-% IJ SOLN
INTRAMUSCULAR | Status: AC
  Filled 2012-10-14: qty 500

## 2012-10-14 MED ORDER — CHLORHEXIDINE GLUCONATE 4 % EX LIQD
60.0000 mL | Freq: Once | CUTANEOUS | Status: AC
  Administered 2012-10-14: 4 via TOPICAL
  Filled 2012-10-14: qty 60

## 2012-10-14 MED ORDER — CARVEDILOL 3.125 MG PO TABS
3.1250 mg | ORAL_TABLET | Freq: Two times a day (BID) | ORAL | Status: DC
  Administered 2012-10-14 – 2012-10-15 (×2): 3.125 mg via ORAL
  Filled 2012-10-14 (×4): qty 1

## 2012-10-14 MED ORDER — YOU HAVE A PACEMAKER BOOK
Freq: Once | Status: AC
  Administered 2012-10-14: 17:00:00
  Filled 2012-10-14: qty 1

## 2012-10-14 MED ORDER — WARFARIN SODIUM 2.5 MG PO TABS: 2.5000 mg | ORAL_TABLET | ORAL | Status: DC

## 2012-10-14 MED ORDER — TAMSULOSIN HCL 0.4 MG PO CAPS
0.4000 mg | ORAL_CAPSULE | Freq: Two times a day (BID) | ORAL | Status: DC
  Administered 2012-10-14 – 2012-10-15 (×2): 0.4 mg via ORAL
  Filled 2012-10-14 (×3): qty 1

## 2012-10-14 MED ORDER — ACETAMINOPHEN 325 MG PO TABS
325.0000 mg | ORAL_TABLET | ORAL | Status: DC | PRN
  Administered 2012-10-15 (×2): 650 mg via ORAL
  Filled 2012-10-14 (×2): qty 2

## 2012-10-14 MED ORDER — SODIUM CHLORIDE 0.9 % IJ SOLN: 3.0000 mL | INTRAMUSCULAR | Status: DC | PRN

## 2012-10-14 MED ORDER — SODIUM CHLORIDE 0.45 % IV SOLN
INTRAVENOUS | Status: DC
  Administered 2012-10-14: 1000 mL via INTRAVENOUS

## 2012-10-14 MED ORDER — WARFARIN SODIUM 2.5 MG PO TABS: 2.5000 mg | ORAL_TABLET | Freq: Every day | ORAL | Status: DC

## 2012-10-14 MED ORDER — MUPIROCIN 2 % EX OINT
TOPICAL_OINTMENT | CUTANEOUS | Status: AC
  Administered 2012-10-14: 1 via NASAL
  Filled 2012-10-14: qty 22

## 2012-10-14 MED ORDER — MUPIROCIN 2 % EX OINT: TOPICAL_OINTMENT | Freq: Two times a day (BID) | CUTANEOUS | Status: DC

## 2012-10-14 MED ORDER — ALLOPURINOL 100 MG PO TABS
100.0000 mg | ORAL_TABLET | Freq: Every day | ORAL | Status: DC
  Administered 2012-10-15: 09:00:00 100 mg via ORAL
  Filled 2012-10-14: qty 1

## 2012-10-14 NOTE — Progress Notes (Signed)
PHARMACIST - PHYSICIAN COMMUNICATION DR:  Ladona Ridgel CONCERNING: Pharmacy Care Issues Regarding Warfarin Labs  RECOMMENDATION (Action Taken): A daily protime for three days has been ordered to meet the [facility] National Patient safety goal and comply with the current [facility] Therapeutics Committee policy.   The Pharmacy will defer all warfarin dose order changes and follow up of lab results to the prescriber unless an additional order to initiate a "pharmacy Coumadin consult" is placed.  DESCRIPTION:  While hospitalized, to be in compliance with The [employer] National Patient Safety Goals, all patients on warfarin must have a baseline and/or current protime prior to the administration of warfarin. Pharmacy has received your order for warfarin without these required laboratory assessments.  Assessment/Plan:   Home Coumadin (for atrial fibrillation) regimen resuming tonight;  5 mg daily except 2.5 mg on Tuesdays and Thursdays.  Last dose 11/2, INR 1.5 today.     Daily PT/INR x 3 days ordered, as above.   Dosing per MD.  Nicolette Bang, RPh Pager: [phone] 10/14/2012 5pm

## 2012-10-14 NOTE — Op Note (Signed)
Biv ICD implanted via the left subclavian vein withut immediate complication.Z#[id number]

## 2012-10-14 NOTE — H&P (Signed)
HPI  Mr. Burch is referred today by Dr. Jens Som for consideration for ICD implant. He is a pleasant 78 yo man with a longstanding chronic systolic heart failure, atrial fibrillation and LBBB. He has class 3 CHF. He has not had syncope. His EF is 30-35% and has been so for 4 years. He has been limited in uptitration of his beta blocker by bradycardia. He has mild peripheral edema.  No Known Allergies  Current Outpatient Prescriptions   Medication  Sig  Dispense  Refill   .  acetaminophen (TYLENOL) 325 MG tablet  Take 650 mg by mouth every 6 (six) hours as needed.     [name]  allopurinol (ZYLOPRIM) 100 MG tablet  TAKE 1 TABLET BY MOUTH EVERY DAY  30 tablet  11   .  carvedilol (COREG) 6.25 MG tablet  TAKE 1/2 TABLET BY MOUTH TWICE DAILY  30 tablet  11   .  furosemide (LASIX) 80 MG tablet  TAKE 1 TABLET BY MOUTH EVERY DAY  30 tablet  11   .  lisinopril (PRINIVIL,ZESTRIL) 40 MG tablet  TAKE 1 TABLET BY MOUTH DAILY  30 tablet  11   .  metolazone (ZAROXOLYN) 2.5 MG tablet  Take 1 tablet (2.5 mg total) by mouth daily.  30 tablet  11   .  potassium chloride 40 MEQ/15ML (20%) LIQD  Take 15 Ml By mouth four times a day     .  potassium chloride 40 MEQ/15ML (20%) LIQD  TAKE 15 ML BY MOUTH FOUR TIMES DAILY  1800 mL  1   .  pravastatin (PRAVACHOL) 40 MG tablet  TAKE 1 TABLET BY MOUTH EVERY NIGHT AT BEDTIME  30 tablet  11   .  Tamsulosin HCl (FLOMAX) 0.4 MG CAPS  Take 1 capsule (0.4 mg total) by mouth 2 (two) times daily.  60 capsule  11   .  warfarin (COUMADIN) 5 MG tablet  Use as directed by Coumadin Clinic  30 tablet  3    Past Medical History   Diagnosis  Date   .  CHF (congestive heart failure)    .  HTN (hypertension)    .  OA (osteoarthritis)    .  BPH (benign prostatic hyperplasia)    .  Obesity    .  Anemia    .  Diabetes mellitus type II    .  Atrial fibrillation    .  Cardiomyopathy    .  Hyperlipidemia    .  Peptic ulcer    .  Bradycardia    .  History of colonoscopy     ROS:  All  systems reviewed and negative except as noted in the HPI.  Past Surgical History   Procedure  Date   .  L-spine  1965   .  Total knee arthroplasty  1997     right   .  Lumbar l4-5 & s1  02/2000   .  Esophagogastroduodenoscopy  06/24/2002    Family History   Problem  Relation  Age of Onset   .  Cancer  Neg Hx    .  Heart disease  Father    .  Heart disease  Mother    .  Diabetes  Mother    .  Hypertension  Brother    .  Hypertension  Brother     History    Social History   .  Marital Status:  Married     Spouse Name:  N/A  Number of Children:  N/A   .  Years of Education:  N/A    Occupational History   .  Retired     Social History Main Topics   .  Smoking status:  Never Smoker   .  Smokeless tobacco:  Not on file   .  Alcohol Use:  No   .  Drug Use:  No   .  Sexually Active:  Not on file    Other Topics  Concern   .  Not on file    Social History Narrative   .  No narrative on file    BP 133/64  Pulse 68  Resp 18  Ht 5\' 11"  (1.803 m)  Wt 246 lb 9.6 oz (111.857 kg)  BMI 34.39 kg/m2  SpO2 98%  Physical Exam:  stable appearing 78 yo man, NAD  HEENT: Unremarkable  Neck: 7 cm JVD, no thyromegally  Lungs: Clear with no wheezes or rhonchi. Minimal basilar rales.  HEART: IRegular rate rhythm, no murmurs, no rubs, no clicks, split S2.  Abd: soft, positive bowel sounds, no organomegally, no rebound, no guarding  Ext: 2 plus pulses, trace edema, no cyanosis, no clubbing  Skin: No rashes no nodules  Neuro: CN II through XII intact, motor grossly intact  EKG  Atrial fibrillation with LBBB.  Assess/Plan:  CONGESTIVE HEART FAILURE - Lewayne Bunting, MD 09/12/2012 6:55 AM Signed  His chronic systolic heart failure is class 3. He would be a candidate for either BiV ICD or PPM. I have discussed the options with the patient and his primary cardiologist and plan to proceed with device implant in the next few weeks. I would anticipate uptitration of his beta blocker after  device implant. ATRIAL FIBRILLATION - Lewayne Bunting, MD 09/12/2012 6:56 AM Signed  His ventricular rate appears to be well controlled. Will follow. MYOCARDIAL INFARCTION, HX OF - Lewayne Bunting, MD 09/12/2012 6:57 AM Signed  Note results of both prior cath and stress testing. He has an inferior scar but no obvious obstruction in RCA. Continue current meds.  EP Attending  Patient seen and examined. In the interim, no changes noted. I have discussed with Dr. Jens Som and the patient and have recommended proceeding with BiV ICD implant with EF 30%, chronic class 2 CHF and LBBB with a QRS duration of 150 ms.  Leonia Reeves.D.

## 2012-10-15 ENCOUNTER — Ambulatory Visit (HOSPITAL_COMMUNITY): Payer: Medicare Other

## 2012-10-15 ENCOUNTER — Other Ambulatory Visit: Payer: Self-pay | Admitting: Endocrinology

## 2012-10-15 DIAGNOSIS — I509 Heart failure, unspecified: Secondary | ICD-10-CM | POA: Diagnosis not present

## 2012-10-15 DIAGNOSIS — Z45018 Encounter for adjustment and management of other part of cardiac pacemaker: Secondary | ICD-10-CM | POA: Diagnosis not present

## 2012-10-15 NOTE — Op Note (Signed)
[name], [name]           ACCOUNT NO.:  [id number]  MEDICAL RECORD NO.:  [id number]  LOCATION:  6525                         FACILITY:  [facility]  PHYSICIAN:  Doylene Canning. Ladona Ridgel, MD    DATE OF BIRTH:  Mar 16, 1929  DATE OF PROCEDURE:  10/14/2012 DATE OF DISCHARGE:  10/15/2012                              OPERATIVE REPORT   PROCEDURE PERFORMED:  Insertion of a biventricular ICD.  INDICATION:  Ischemic cardiomyopathy, chronic systolic heart failure, ejection fraction 30%, left bundle-branch block, QRS duration 150 msec, and atrial fibrillation.  INTRODUCTION:  The patient is an 78 year old male with worsening heart failure symptoms, who presents with worsening left ventricular dysfunction, ejection fraction of 30%.  He has left bundle-branch block with a QRS duration of 150 milliseconds.  He has been on maximal medical therapy.  He is now referred for ICD implantation.  PROCEDURE:  After informed consent was obtained, the patient was taken to the diagnostic EP lab in a fasting state.  After usual preparation and draping, intravenous fentanyl and midazolam was given for sedation. 30 mL of lidocaine was infiltrated into the left infraclavicular region. A 7-cm incision was carried out over this region.  Electrocautery was utilized to dissect down to the fascial plane.  The left subclavian vein was punctured x3.  A Medtronic model 5076 52-cm active fixation pacing lead serial number [id number] was advanced into the right atrium. Medtronic model 6935 65 cm single coil active defibrillation lead, serial number [id number] was advanced to the right ventricle.  Mapping was carried out in the right ventricle.  Throughout the right ventricle, the R-waves were reduced.  The final site at the R-waves measured 8 with a large injury current, and the pacing impedance was 600 ohms. Threshold was 0.6 V at 0.5 msec and 10 V pacing did not stimulate the diaphragm.  With the ventricular lead in  satisfactory position, attention was then turned to placement of the atrial lead, placed in anterolateral right atrium where fibrillation waves measured 1 mV.  The pacing impedance in AOO mode was 711 ohms.  The threshold could not be obtained secondary to underlying atrial fibrillation.  It should be noted that it is expected that the patient will be returned to sinus rhythm in the future.  With these satisfactory parameters, attention was then turned to placement of left ventricular lead.  The coronary sinus was cannulated without much difficulty.  The venography was carried out of the coronary sinus demonstrating a posterolateral vein, which was selected for LV lead placement.  There were no other good vein options. In the posterolateral vein, the 0.014 guidewire was advanced into this vein and the superior branch was selected.  The Medtronic model 4296 88 cm bipolar pacing lead, serial number [id number] was advanced into the posterior lateral vein.  At the final site, the pacing threshold was elevated, but the anatomy of the vein did not allow much for mapping. The threshold bipolar was less than 2 V at 0.8 msec.  10 V pacing was not demonstrated until a voltage of greater than 6 V was utilized.  At this point, the lead was liberated from the guiding catheter in the usual manner.  The leads were secured to the subpectoral fascia with a figure-of-eight silk suture.  Sewing sleeve was secured with silk suture.  Electrocautery was utilized to make a subcutaneous pocket. Antibiotic irrigation was utilized to irrigate the pocket. Electrocautery was utilized to assure hemostasis.  A Medtronic BiV ICD serial number [id number] was connected to the atrial, RV, and LV leads and placed back in the subcutaneous pocket.  The pocket was irrigated with antibiotic irrigation and the incision was closed with 2-0 and 3-0 Vicryl.  At this point, I scrubbed out of the case to supervise defibrillation  threshold testing.  After the patient was more deeply sedated with intravenous fentanyl and Versed under my supervision, VF was induced with a T-wave shock.  A 20- joule shock was delivered, which terminated ventricular fibrillation and restored sinus rhythm.  At this point, no additional defibrillation threshold testing was carried out, and benzoin and Steri-Strips were painted on the skin and pressure dressing applied and the patient returned to his room in satisfactory condition.  COMPLICATIONS:  There were no immediate procedure complications.  RESULTS:  This demonstrates successful implantation of a Medtronic biventricular ICD in a patient with longstanding cardiomyopathy, chronic systolic heart failure, ejection fraction of 30% and left bundle-branch block.     Doylene Canning. Ladona Ridgel, MD     GWT/MEDQ  D:  10/14/2012  T:  10/15/2012  Job:  [id number]  cc:   Madolyn Frieze. Jens Som, MD, [facility]

## 2012-10-15 NOTE — Discharge Summary (Addendum)
ELECTROPHYSIOLOGY PROCEDURE DISCHARGE SUMMARY    Patient ID: [name],  MRN: [id number], DOB/AGE: 03-20-1929 78 y.o.  Admit date: 10/14/2012 Discharge date: 11/14/2012  Primary Care Physician: Steven Belling, MD Primary Cardiologist: Steven Millers, MD Electrophysiologist: Steven Bunting, MD  Primary Discharge Diagnosis:  Non ischemic cardiomyopathy and LBBB status post CRTD implantation this admission  Secondary Discharge Diagnosis:  1.  Hypertension 2.  Osteoarthritis 3.  BPH 4.  Atrial fibrillation- anticoagulated with Warfarin 5.  Hyperlipidemia 6.  Obesity   Procedures This Admission:  1.  Cardiac resynchronization therapy defibrillator implanted 10-14-2012 by Dr [name].  The patient received a Medtronic device.  See dictated op report for full details.  There were no early apparent complications. 2.  CXR on 10-15-2012 demonstrated no pneumothorax status post device implant  Brief HPI: [name] was referred to Dr [name] by Dr. Jens Kim for consideration for ICD implant. He is a pleasant 78 yo man with a longstanding chronic systolic heart failure, atrial fibrillation and LBBB. He has class 3 CHF. He has not had syncope. His EF is 30-35% and has been so for 4 years. He has been limited in uptitration of his beta blocker by bradycardia. He has mild peripheral edema.  Risks, benefits, and alternatives to CRTD implantation were reviewed with the patient who wished to proceed.   Hospital Course:  The patient was admitted and underwent implantation of a Medtronic CRTD with details as outlined above.   He was monitored on telemetry overnight which demonstrated atrial fib with a controlled ventricular response.  Left chest was without hematoma or ecchymosis.  The device was interrogated and found to be functioning normally.  CXR was obtained and demonstrated no pneumothorax status post device implantation.  Wound care, arm mobility, and restrictions were reviewed with the  patient.  Dr [name] examined the patient and considered them stable for discharge to home. Routine follow up was scheduled and the patient will resume home dose of Coumadin and follow up with CVRR on Monday.    Discharge Vitals: Blood pressure 126/73, pulse 79, temperature 98.4 F (36.9 C), temperature source Oral, resp. rate 37, height 5\' 11"  (1.803 m), weight 239 lb 6.7 oz (108.6 kg), SpO2 99.00%.    Labs:   Lab Results  Component Value Date   WBC 6.2 10/07/2012   HGB 13.3 10/07/2012   HCT 41.3 10/07/2012   MCV 94.9 10/07/2012   PLT 128.0* 10/07/2012      Discharge Medications:    Medication List     As of 11/14/2012  7:27 AM    TAKE these medications         acetaminophen 325 MG tablet   Commonly known as: TYLENOL   Take 650 mg by mouth every 6 (six) hours as needed. For pain      allopurinol 100 MG tablet   Commonly known as: ZYLOPRIM   Take 100 mg by mouth daily.      carvedilol 6.25 MG tablet   Commonly known as: COREG   Take 3.125 mg by mouth 2 (two) times daily with a meal.      furosemide 80 MG tablet   Commonly known as: LASIX   Take 80 mg by mouth daily.      lisinopril 40 MG tablet   Commonly known as: PRINIVIL,ZESTRIL   Take 40 mg by mouth daily.      metolazone 2.5 MG tablet   Commonly known as: ZAROXOLYN   Take 2.5 mg by mouth daily.  potassium chloride 40 MEQ/15ML (20%) Liqd   Take 15 Ml  By mouth four times a day      pravastatin 40 MG tablet   Commonly known as: PRAVACHOL   Take 40 mg by mouth daily.      Tamsulosin HCl 0.4 MG Caps   Commonly known as: FLOMAX   Take 0.4 mg by mouth 2 (two) times daily.      warfarin 5 MG tablet   Commonly known as: COUMADIN   Take 2.5-5 mg by mouth. Takes 1 tablet 5 mg every day except Tuesdays & Thursdays and takes 2.5 mg          Disposition:  Discharge Orders    Future Appointments: Provider: Department: Dept Phone: Center:   11/28/2012 9:15 AM [facility]  C[facility]in Clinic [phone] None   11/28/2012 9:45 AM Steven Bunting, MD [facility] Main Office [place]) [phone] [facility]   01/20/2013 2:15 PM Steven Maw, MD [facility] Main Office [place]) [phone] [facility]     Future Orders Please Complete By Expires   Diet - low sodium heart healthy      Increase activity slowly      Discharge instructions      Comments:   Please see post BiV ICD discharge instructions     Follow-up Information    Follow up with [facility]. On 10/20/2012. (At 9:45 AM for Coumadin follow-up (INR check))    Contact information:   [address]  [address] [place] [address] [phone]      Follow up with  [facility] On 10/22/2012. (At 4:00 PM for wound check)    Contact information:   [address]  [address] [place] [address] [phone]       Follow up with Steven Millers, MD. On 11/28/2012. (At 9:45 AM)    Contact information:   [address]  [address] [place] [address] [phone]       Follow up with Steven Bunting, MD. On 01/20/2013. (At 2:15 PM)    Contact information:   [address]  [address] [place] [address] [phone]         Duration of Discharge Encounter: Greater than 30 minutes including physician time.  Signed, Steven Balsam, RN, BSN 11/14/2012, 7:27 AM  EP Atending  Patient seen and examined. Agree with above.  [name].D.

## 2012-10-15 NOTE — Progress Notes (Signed)
Utilization Review Completed.  

## 2012-10-15 NOTE — Progress Notes (Signed)
   ELECTROPHYSIOLOGY ROUNDING NOTE    Patient Name: [name] Date of Encounter: 10-15-2012    SUBJECTIVE:Patient feels well.  No chest pain or shortness of breath.  Minimal incisional soreness.  S/p CRTD implant 10-14-2012  TELEMETRY: Reviewed telemetry pt in atrial fibrillation with controlled ventricular response Filed Vitals:   10/14/12 1700 10/14/12 1800 10/14/12 2005 10/15/12 0100  BP: 98/57 103/57 106/55 102/52  Pulse: 50 63 60 56  Temp:   97.3 F (36.3 C) 97.7 F (36.5 C)  TempSrc:   Oral Oral  Resp: 14 15 16 14   Height:      Weight:    239 lb 6.7 oz (108.6 kg)  SpO2: 96% 97% 97% 97%    Intake/Output Summary (Last 24 hours) at 10/15/12 0643 Last data filed at 10/15/12 0626  Gross per 24 hour  Intake    760 ml  Output   1600 ml  Net   -840 ml    LABS: INR: 1.50  Radiology/Studies:  Final result pending, leads in stable position.  PHYSICAL EXAM Left chest without hematoma or ecchymosis  DEVICE INTERROGATION: Device interrogation - Normal biV icd function  ECG - atrial fib  Wound care, arm mobility, restrictions, shock plan reviewed with patient.  Routine follow up scheduled.   EP Attending  Patient seen and examined. Agree with above. Plan discharge later this morning with usual followup. Sodium restriction has been stressed.  Leonia Reeves.D.

## 2012-10-15 NOTE — Plan of Care (Signed)
Problem: Consults Goal: Permanent Pacemaker Patient Education (See Patient Education module for education specifics.)  Outcome: Completed/Met Date Met:  10/15/12 Completed education with patient, daughter and wife with good verbalized understanding  Problem: Phase III Progression Outcomes Goal: Ambulating in room or hall Outcome: Completed/Met Date Met:  10/15/12 Tolerating activity well, ambulating in room with steady gait using cane in right hand.   Problem: Discharge Progression Outcomes Goal: No evidence of pacemaker malfunction Outcome: Completed/Met Date Met:  10/15/12 No evidence of pacer malfunction, interrogated by pacemaker rep and noted to be functioning well, some adjustments made. Chest xray done. Dr. Ladona Ridgel into see patient and assess information prior to discharge.    Goal: Site without bleeding, hematoma, S/S infection Outcome: Completed/Met Date Met:  10/15/12 Pacemaker site clean dry and intact with no bleeding or drainage noted. Goal: Discharge plan in place and appropriate Outcome: Completed/Met Date Met:  10/15/12 Home today as planned Goal: Pain controlled with appropriate interventions Outcome: Completed/Met Date Met:  10/15/12 Patient denies any pain or discomfort Goal: Tolerating diet Outcome: Completed/Met Date Met:  10/15/12 Tolerated 100% of breakfast with no problems

## 2012-10-16 ENCOUNTER — Encounter: Payer: Self-pay | Admitting: *Deleted

## 2012-10-16 DIAGNOSIS — Z95810 Presence of automatic (implantable) cardiac defibrillator: Secondary | ICD-10-CM | POA: Insufficient documentation

## 2012-10-20 ENCOUNTER — Ambulatory Visit (INDEPENDENT_AMBULATORY_CARE_PROVIDER_SITE_OTHER): Payer: Medicare Other | Admitting: *Deleted

## 2012-10-20 DIAGNOSIS — I4891 Unspecified atrial fibrillation: Secondary | ICD-10-CM | POA: Diagnosis not present

## 2012-10-20 DIAGNOSIS — Z7901 Long term (current) use of anticoagulants: Secondary | ICD-10-CM | POA: Diagnosis not present

## 2012-10-20 LAB — POCT INR: INR: 1.4

## 2012-10-22 ENCOUNTER — Ambulatory Visit (INDEPENDENT_AMBULATORY_CARE_PROVIDER_SITE_OTHER): Payer: Medicare Other | Admitting: *Deleted

## 2012-10-22 DIAGNOSIS — I4891 Unspecified atrial fibrillation: Secondary | ICD-10-CM | POA: Diagnosis not present

## 2012-10-22 DIAGNOSIS — I428 Other cardiomyopathies: Secondary | ICD-10-CM | POA: Diagnosis not present

## 2012-10-22 DIAGNOSIS — I509 Heart failure, unspecified: Secondary | ICD-10-CM

## 2012-10-22 LAB — ICD DEVICE OBSERVATION
AL AMPLITUDE: 0.25 mv
AL IMPEDENCE ICD: 532 Ohm
BATTERY VOLTAGE: 3.1807 V
HV IMPEDENCE: 57 Ohm
PACEART VT: 0
RV LEAD AMPLITUDE: 5.125 mv
RV LEAD IMPEDENCE ICD: 437 Ohm
RV LEAD THRESHOLD: 0.875 V
TOT-0002: 0
TZAT-0001ATACH: 2
TZAT-0001SLOWVT: 1
TZAT-0001SLOWVT: 2
TZAT-0002ATACH: NEGATIVE
TZAT-0002ATACH: NEGATIVE
TZAT-0002FASTVT: NEGATIVE
TZAT-0004SLOWVT: 8
TZAT-0005SLOWVT: 88 pct
TZAT-0005SLOWVT: 91 pct
TZAT-0011SLOWVT: 10 ms
TZAT-0011SLOWVT: 10 ms
TZAT-0012ATACH: 150 ms
TZAT-0012FASTVT: 170 ms
TZAT-0018ATACH: NEGATIVE
TZAT-0018ATACH: NEGATIVE
TZAT-0018FASTVT: NEGATIVE
TZAT-0018SLOWVT: NEGATIVE
TZAT-0018SLOWVT: NEGATIVE
TZAT-0019ATACH: 6 V
TZAT-0019ATACH: 6 V
TZAT-0019FASTVT: 8 V
TZAT-0019SLOWVT: 8 V
TZAT-0020ATACH: 1.5 ms
TZAT-0020ATACH: 1.5 ms
TZAT-0020FASTVT: 1.5 ms
TZON-0004VSLOWVT: 36
TZST-0001ATACH: 5
TZST-0001FASTVT: 3
TZST-0001FASTVT: 5
TZST-0001FASTVT: 6
TZST-0001SLOWVT: 3
TZST-0001SLOWVT: 4
TZST-0002ATACH: NEGATIVE
TZST-0002FASTVT: NEGATIVE
TZST-0002FASTVT: NEGATIVE
TZST-0002FASTVT: NEGATIVE
TZST-0003SLOWVT: 30 J
TZST-0003SLOWVT: 35 J

## 2012-10-22 NOTE — Progress Notes (Signed)
Wound check-ICD 

## 2012-10-29 ENCOUNTER — Ambulatory Visit (INDEPENDENT_AMBULATORY_CARE_PROVIDER_SITE_OTHER): Payer: Medicare Other | Admitting: Pharmacist

## 2012-10-29 DIAGNOSIS — I4891 Unspecified atrial fibrillation: Secondary | ICD-10-CM | POA: Diagnosis not present

## 2012-10-29 DIAGNOSIS — Z7901 Long term (current) use of anticoagulants: Secondary | ICD-10-CM | POA: Diagnosis not present

## 2012-10-29 LAB — POCT INR: INR: 2.4

## 2012-10-30 ENCOUNTER — Other Ambulatory Visit: Payer: Self-pay | Admitting: Cardiology

## 2012-11-11 ENCOUNTER — Encounter: Payer: Self-pay | Admitting: Internal Medicine

## 2012-11-20 ENCOUNTER — Other Ambulatory Visit: Payer: Self-pay | Admitting: *Deleted

## 2012-11-20 MED ORDER — WARFARIN SODIUM 5 MG PO TABS: ORAL_TABLET | ORAL | Status: DC

## 2012-11-20 NOTE — Telephone Encounter (Signed)
REFILL FOR COUMADIN SENT TO [facility].

## 2012-11-28 ENCOUNTER — Encounter: Payer: Self-pay | Admitting: Cardiology

## 2012-11-28 ENCOUNTER — Ambulatory Visit (INDEPENDENT_AMBULATORY_CARE_PROVIDER_SITE_OTHER): Payer: Medicare Other | Admitting: Cardiology

## 2012-11-28 ENCOUNTER — Ambulatory Visit (INDEPENDENT_AMBULATORY_CARE_PROVIDER_SITE_OTHER): Payer: Medicare Other | Admitting: *Deleted

## 2012-11-28 VITALS — BP 145/87 | HR 86 | Ht 71.0 in | Wt 243.0 lb

## 2012-11-28 DIAGNOSIS — Z7901 Long term (current) use of anticoagulants: Secondary | ICD-10-CM

## 2012-11-28 DIAGNOSIS — I4891 Unspecified atrial fibrillation: Secondary | ICD-10-CM | POA: Diagnosis not present

## 2012-11-28 DIAGNOSIS — I10 Essential (primary) hypertension: Secondary | ICD-10-CM

## 2012-11-28 DIAGNOSIS — Z95810 Presence of automatic (implantable) cardiac defibrillator: Secondary | ICD-10-CM

## 2012-11-28 DIAGNOSIS — I428 Other cardiomyopathies: Secondary | ICD-10-CM

## 2012-11-28 DIAGNOSIS — R609 Edema, unspecified: Secondary | ICD-10-CM

## 2012-11-28 LAB — POCT INR: INR: 2.4

## 2012-11-28 MED ORDER — POTASSIUM CHLORIDE 40 MEQ/15ML (20%) PO LIQD: ORAL | Status: DC

## 2012-11-28 MED ORDER — CARVEDILOL 6.25 MG PO TABS: 6.2500 mg | ORAL_TABLET | Freq: Two times a day (BID) | ORAL | Status: DC

## 2012-11-28 NOTE — Assessment & Plan Note (Signed)
Management per primary care. 

## 2012-11-28 NOTE — Progress Notes (Signed)
HPI: Pleasant gentleman with a history of nonischemic cardiomyopathy as well as atrial fibrillation for fu. Note, he had a catheterization in January 2006 that showed no coronary artery disease and an ejection fraction of 40%. He had a Myoview performed last on May 04, 2008, that showed an ejection fraction of 31%. There was felt to be a prior inferior infarct with mild peri-infarct ischemia. I did review this and felt there was a low risk and we have been treating medically. He does have permanent atrial fibrillation as well. Holter monitor in August of 2011 showed mildly reduced rate and we decreased his Coreg. His last echocardiogram was performed in August of 2013. His ejection fraction was 30-35%. There was mild to moderate mitral regurgitation. The left atrium was mild to moderately dilated as was the right atrium. In November of 2013 the patient had a biventricular ICD placed. Since then, he denies dyspnea, chest pain, palpitations, syncope or bleeding. His pedal edema has improved some.   Current Outpatient Prescriptions  Medication Sig Dispense Refill  . acetaminophen (TYLENOL) 325 MG tablet Take 650 mg by mouth every 6 (six) hours as needed. For pain      . allopurinol (ZYLOPRIM) 100 MG tablet Take 100 mg by mouth daily.      . carvedilol (COREG) 6.25 MG tablet Take 3.125 mg by mouth 2 (two) times daily with a meal.      . furosemide (LASIX) 80 MG tablet Take 80 mg by mouth daily.      [name] lisinopril (PRINIVIL,ZESTRIL) 40 MG tablet Take 40 mg by mouth daily.      . metolazone (ZAROXOLYN) 2.5 MG tablet Take 2.5 mg by mouth daily.      . potassium chloride 40 MEQ/15ML (20%) LIQD Take 15 Ml  By mouth four times a day      . pravastatin (PRAVACHOL) 40 MG tablet Take 40 mg by mouth daily.      . Tamsulosin HCl (FLOMAX) 0.4 MG CAPS Take 0.4 mg by mouth 2 (two) times daily.      [name] warfarin (COUMADIN) 5 MG tablet USE AS DIRECTED BY COUMADIN CLINIC  30 tablet  0     Past Medical History    Diagnosis Date  . CHF (congestive heart failure)   . HTN (hypertension)   . OA (osteoarthritis)   . BPH (benign prostatic hyperplasia)   . Obesity   . Anemia   . Diabetes mellitus type II   . Atrial fibrillation   . Cardiomyopathy   . Hyperlipidemia   . Peptic ulcer   . Bradycardia   . History of colonoscopy   . ICD (implantable cardiac defibrillator) in place 10/14/2012    biventricular  . GERD (gastroesophageal reflux disease)     Past Surgical History  Procedure Date  . L-spine 1965  . Total knee arthroplasty 1997    right  . Lumbar l4-5 & s1 02/2000  . Esophagogastroduodenoscopy 06/24/2002    History   Social History  . Marital Status: Married    Spouse Name: N/A    Number of Children: N/A  . Years of Education: N/A   Occupational History  . Retired    Social History Main Topics  . Smoking status: Never Smoker   . Smokeless tobacco: Never Used  . Alcohol Use: No  . Drug Use: No  . Sexually Active: Not on file   Other Topics Concern  . Not on file   Social History Narrative  . No narrative  on file    ROS: no fevers or chills, productive cough, hemoptysis, dysphasia, odynophagia, melena, hematochezia, dysuria, hematuria, rash, seizure activity, orthopnea, PND, claudication. Remaining systems are negative.  Physical Exam: Well-developed well-nourished in no acute distress.  Skin is warm and dry.  HEENT is normal.  Neck is supple.  Chest is clear to auscultation with normal expansion. ICD left chest without evidence of infection and no hematoma. Cardiovascular exam is irregular Abdominal exam nontender or distended. No masses palpated. Extremities show 1+ ankle edema. neuro grossly intact

## 2012-11-28 NOTE — Assessment & Plan Note (Signed)
History of nonischemic cardiomyopathy. Continue lisinopril. Increase carvedilol to 6.25 mg by mouth twice a day.

## 2012-11-28 NOTE — Assessment & Plan Note (Signed)
Blood pressure mildly elevated. Increase carvedilol to 6.25 mg by mouth twice a day.

## 2012-11-28 NOTE — Assessment & Plan Note (Signed)
Management per electrophysiology. 

## 2012-11-28 NOTE — Patient Instructions (Addendum)
Your physician wants you to follow-up in: 6 MONTHS WITH DR CRENSHAW You will receive a reminder letter in the mail two months in advance. If you don't receive a letter, please call our office to schedule the follow-up appointment.   INCREASE CARVEDILOL TO 6.25 MG TWICE DAILY 

## 2012-11-28 NOTE — Assessment & Plan Note (Signed)
Continue present dose of diuretics. 

## 2012-11-28 NOTE — Assessment & Plan Note (Signed)
Continue carvedilol and Coumadin. 

## 2012-12-04 ENCOUNTER — Other Ambulatory Visit: Payer: Self-pay | Admitting: Cardiology

## 2012-12-09 ENCOUNTER — Telehealth: Payer: Self-pay | Admitting: Cardiology

## 2012-12-09 MED ORDER — POTASSIUM CHLORIDE 40 MEQ/15ML (20%) PO LIQD: 40.0000 meq | Freq: Four times a day (QID) | ORAL | Status: DC

## 2012-12-09 NOTE — Telephone Encounter (Signed)
Spoke with pt, his liquid potassium was sent into the pharm incorrectly. Script resent.

## 2012-12-09 NOTE — Telephone Encounter (Signed)
plz return call to pt wife [phone]  Regarding medication dosage

## 2012-12-18 ENCOUNTER — Other Ambulatory Visit: Payer: Self-pay | Admitting: Cardiology

## 2012-12-18 MED ORDER — PRAVASTATIN SODIUM 40 MG PO TABS: 40.0000 mg | ORAL_TABLET | Freq: Every day | ORAL | Status: DC

## 2012-12-26 ENCOUNTER — Ambulatory Visit (INDEPENDENT_AMBULATORY_CARE_PROVIDER_SITE_OTHER): Payer: Medicare Other | Admitting: *Deleted

## 2012-12-26 DIAGNOSIS — I4891 Unspecified atrial fibrillation: Secondary | ICD-10-CM | POA: Diagnosis not present

## 2012-12-26 DIAGNOSIS — Z7901 Long term (current) use of anticoagulants: Secondary | ICD-10-CM | POA: Diagnosis not present

## 2012-12-26 LAB — POCT INR: INR: 2.1

## 2012-12-29 ENCOUNTER — Other Ambulatory Visit: Payer: Self-pay | Admitting: Endocrinology

## 2013-01-13 ENCOUNTER — Other Ambulatory Visit: Payer: Self-pay | Admitting: Cardiology

## 2013-01-13 NOTE — Telephone Encounter (Signed)
Fax Received. Refill Completed. Malessa Zartman Chowoe (R.M.A)   

## 2013-01-14 ENCOUNTER — Other Ambulatory Visit: Payer: Self-pay | Admitting: *Deleted

## 2013-01-14 MED ORDER — POTASSIUM CHLORIDE 40 MEQ/15ML (20%) PO LIQD: ORAL | Status: DC

## 2013-01-20 ENCOUNTER — Ambulatory Visit (INDEPENDENT_AMBULATORY_CARE_PROVIDER_SITE_OTHER): Payer: Medicare Other

## 2013-01-20 ENCOUNTER — Encounter: Payer: Self-pay | Admitting: Internal Medicine

## 2013-01-20 ENCOUNTER — Ambulatory Visit (INDEPENDENT_AMBULATORY_CARE_PROVIDER_SITE_OTHER): Payer: Medicare Other | Admitting: Internal Medicine

## 2013-01-20 VITALS — BP 143/87 | HR 81 | Ht 71.0 in | Wt 235.8 lb

## 2013-01-20 DIAGNOSIS — I4891 Unspecified atrial fibrillation: Secondary | ICD-10-CM

## 2013-01-20 DIAGNOSIS — Z95810 Presence of automatic (implantable) cardiac defibrillator: Secondary | ICD-10-CM

## 2013-01-20 DIAGNOSIS — Z7901 Long term (current) use of anticoagulants: Secondary | ICD-10-CM | POA: Diagnosis not present

## 2013-01-20 DIAGNOSIS — I509 Heart failure, unspecified: Secondary | ICD-10-CM

## 2013-01-20 DIAGNOSIS — I2589 Other forms of chronic ischemic heart disease: Secondary | ICD-10-CM

## 2013-01-20 LAB — ICD DEVICE OBSERVATION
AL AMPLITUDE: 0.25 mv
AL IMPEDENCE ICD: 494 Ohm
ATRIAL PACING ICD: 0 pct
CHARGE TIME: 3.343 s
FVT: 0
LV LEAD THRESHOLD: 2.5 V
RV LEAD IMPEDENCE ICD: 437 Ohm
TOT-0001: 1
TOT-0002: 0
TZAT-0001ATACH: 1
TZAT-0001ATACH: 3
TZAT-0001FASTVT: 1
TZAT-0002ATACH: NEGATIVE
TZAT-0004SLOWVT: 8
TZAT-0012ATACH: 150 ms
TZAT-0012ATACH: 150 ms
TZAT-0012SLOWVT: 170 ms
TZAT-0012SLOWVT: 170 ms
TZAT-0013SLOWVT: 2
TZAT-0013SLOWVT: 2
TZAT-0018ATACH: NEGATIVE
TZAT-0019ATACH: 6 V
TZAT-0019SLOWVT: 8 V
TZAT-0019SLOWVT: 8 V
TZAT-0020ATACH: 1.5 ms
TZAT-0020ATACH: 1.5 ms
TZAT-0020ATACH: 1.5 ms
TZAT-0020SLOWVT: 1.5 ms
TZAT-0020SLOWVT: 1.5 ms
TZON-0003ATACH: 350 ms
TZON-0003SLOWVT: 310 ms
TZON-0003VSLOWVT: 400 ms
TZON-0004SLOWVT: 32
TZON-0005SLOWVT: 12
TZST-0001ATACH: 4
TZST-0001ATACH: 6
TZST-0001FASTVT: 2
TZST-0001FASTVT: 4
TZST-0001SLOWVT: 5
TZST-0002ATACH: NEGATIVE
TZST-0002ATACH: NEGATIVE
TZST-0002FASTVT: NEGATIVE
TZST-0002FASTVT: NEGATIVE
TZST-0003SLOWVT: 35 J
TZST-0003SLOWVT: 35 J

## 2013-01-20 LAB — POCT INR: INR: 2.2

## 2013-01-20 NOTE — Progress Notes (Signed)
HPI [name] returns today for followup. He is an 79 year old man with a nonischemic cardiomyopathy, chronic systolic heart failure, chronic atrial fibrillation, status post biventricular ICD implantation. His heart failure symptoms have improved from class III to class II. His peripheral edema is much better. He denies syncope or ICD shock. He has residual swelling in his right leg though he has had knee replacement there in the past. No fever or chills. He has occasional fullness in his chest. No Known Allergies   Current Outpatient Prescriptions  Medication Sig Dispense Refill  . acetaminophen (TYLENOL) 325 MG tablet Take 650 mg by mouth every 6 (six) hours as needed. For pain      . allopurinol (ZYLOPRIM) 100 MG tablet Take 100 mg by mouth daily.      . carvedilol (COREG) 6.25 MG tablet Take 1 tablet (6.25 mg total) by mouth 2 (two) times daily with a meal.  60 tablet  12  . furosemide (LASIX) 80 MG tablet Take 80 mg by mouth daily.      [name] lisinopril (PRINIVIL,ZESTRIL) 40 MG tablet Take 40 mg by mouth daily.      . metolazone (ZAROXOLYN) 2.5 MG tablet Take 2.5 mg by mouth daily.      . pravastatin (PRAVACHOL) 40 MG tablet Take 1 tablet (40 mg total) by mouth daily.  30 tablet  5  . Tamsulosin HCl (FLOMAX) 0.4 MG CAPS Take 0.4 mg by mouth 2 (two) times daily.      . potassium chloride 40 MEQ/15ML (20%) LIQD TAKE 15 MLS BY MOUTH FOUR TIMES DAILY  1800 mL  3  . warfarin (COUMADIN) 5 MG tablet TAKE AS DIRECTED BY COUMADIN CLINIC  30 tablet  0   No current facility-administered medications for this visit.     Past Medical History  Diagnosis Date  . CHF (congestive heart failure)   . HTN (hypertension)   . OA (osteoarthritis)   . BPH (benign prostatic hyperplasia)   . Obesity   . Anemia   . Diabetes mellitus type II   . Atrial fibrillation   . Cardiomyopathy   . Hyperlipidemia   . Peptic ulcer   . Bradycardia   . History of colonoscopy   . ICD (implantable cardiac  defibrillator) in place 10/14/2012    biventricular  . GERD (gastroesophageal reflux disease)     ROS:   All systems reviewed and negative except as noted in the HPI.   Past Surgical History  Procedure Laterality Date  . L-spine  1965  . Total knee arthroplasty  1997    right  . Lumbar l4-5 & s1  02/2000  . Esophagogastroduodenoscopy  06/24/2002     Family History  Problem Relation Age of Onset  . Cancer Neg Hx   . Heart disease Father   . Heart disease Mother   . Diabetes Mother   . Hypertension Brother   . Hypertension Brother      History   Social History  . Marital Status: Married    Spouse Name: N/A    Number of Children: N/A  . Years of Education: N/A   Occupational History  . Retired    Social History Main Topics  . Smoking status: Never Smoker   . Smokeless tobacco: Never Used  . Alcohol Use: No  . Drug Use: No  . Sexually Active: Not on file   Other Topics Concern  . Not on file   Social History Narrative  . No narrative on file  BP 143/87  Pulse 81  Ht 5\' 11"  (1.803 m)  Wt 235 lb 12.8 oz (106.958 kg)  BMI 32.9 kg/m2  Physical Exam:  Well appearing 79 year old man,NAD HEENT: Unremarkable Neck:  7 cm JVD, no thyromegally Lungs:  Clearwith no wheezes, rales, or rhonchi. Well-healed ICD incision. HEART:  Regular rate rhythm, no murmurs, no rubs, no clicks Abd:  soft, positive bowel sounds, no organomegally, no rebound, no guarding Ext:  2 plus pulses, no edema, no cyanosis, no clubbing Skin:  No rashes no nodules Neuro:  CN II through XII intact, motor grossly intact  EKG atrial fibrillation with ventricular pacing  DEVICE  Normal device function.  See PaceArt for details.   Assess/Plan:

## 2013-01-20 NOTE — Patient Instructions (Addendum)
Your physician recommends that you schedule a follow-up appointment in: 3 months with Belenda Cruise and Gunnar Fusi in the Device [facility].  Your physician wants you to follow-up in: 9 months with Dr. Ladona Ridgel.  You will receive a reminder letter in the mail two months in advance. If you don't receive a letter, please call our office to schedule the follow-up appointment.

## 2013-01-20 NOTE — Assessment & Plan Note (Signed)
His ventricular rate is well controlled. No change in medical therapy. 

## 2013-01-20 NOTE — Assessment & Plan Note (Signed)
His chronic systolic heart failure is well compensated and class II. He'll continue his current medical therapy. I've encouraged the patient to maintain a low-sodium diet.

## 2013-01-20 NOTE — Assessment & Plan Note (Signed)
His Medtronic biventricular ICD is working normally today. He is pacing in the ventricle 92% of the time. His fluid index is well controlled. He is chronically in atrial fibrillation.

## 2013-01-26 ENCOUNTER — Encounter: Payer: Self-pay | Admitting: Internal Medicine

## 2013-02-05 ENCOUNTER — Other Ambulatory Visit: Payer: Self-pay | Admitting: Cardiology

## 2013-03-03 ENCOUNTER — Ambulatory Visit (INDEPENDENT_AMBULATORY_CARE_PROVIDER_SITE_OTHER): Payer: Medicare Other | Admitting: *Deleted

## 2013-03-03 DIAGNOSIS — Z7901 Long term (current) use of anticoagulants: Secondary | ICD-10-CM | POA: Diagnosis not present

## 2013-03-03 DIAGNOSIS — I4891 Unspecified atrial fibrillation: Secondary | ICD-10-CM

## 2013-03-18 ENCOUNTER — Other Ambulatory Visit: Payer: Self-pay | Admitting: *Deleted

## 2013-03-18 MED ORDER — LISINOPRIL 40 MG PO TABS: 40.0000 mg | ORAL_TABLET | Freq: Every day | ORAL | Status: DC

## 2013-04-07 ENCOUNTER — Other Ambulatory Visit: Payer: Self-pay | Admitting: Endocrinology

## 2013-04-08 ENCOUNTER — Ambulatory Visit: Payer: Medicare Other | Admitting: Cardiology

## 2013-04-14 ENCOUNTER — Ambulatory Visit (INDEPENDENT_AMBULATORY_CARE_PROVIDER_SITE_OTHER): Payer: Medicare Other | Admitting: *Deleted

## 2013-04-14 DIAGNOSIS — Z7901 Long term (current) use of anticoagulants: Secondary | ICD-10-CM | POA: Diagnosis not present

## 2013-04-14 DIAGNOSIS — I4891 Unspecified atrial fibrillation: Secondary | ICD-10-CM

## 2013-04-15 ENCOUNTER — Other Ambulatory Visit: Payer: Self-pay | Admitting: *Deleted

## 2013-04-15 MED ORDER — METOLAZONE 2.5 MG PO TABS: 2.5000 mg | ORAL_TABLET | Freq: Every day | ORAL | Status: DC

## 2013-04-16 ENCOUNTER — Other Ambulatory Visit: Payer: Self-pay | Admitting: Cardiology

## 2013-04-22 ENCOUNTER — Ambulatory Visit (INDEPENDENT_AMBULATORY_CARE_PROVIDER_SITE_OTHER): Payer: Medicare Other | Admitting: *Deleted

## 2013-04-22 DIAGNOSIS — I498 Other specified cardiac arrhythmias: Secondary | ICD-10-CM | POA: Diagnosis not present

## 2013-04-22 DIAGNOSIS — I428 Other cardiomyopathies: Secondary | ICD-10-CM

## 2013-04-22 DIAGNOSIS — I509 Heart failure, unspecified: Secondary | ICD-10-CM | POA: Diagnosis not present

## 2013-04-22 LAB — ICD DEVICE OBSERVATION
ATRIAL PACING ICD: 0 pct
CHARGE TIME: 8.388 s
FVT: 0
HV IMPEDENCE: 57 Ohm
LV LEAD THRESHOLD: 2.5 V
PACEART VT: 0
RV LEAD AMPLITUDE: 4.5 mv
TOT-0001: 1
TZAT-0001ATACH: 1
TZAT-0001ATACH: 2
TZAT-0001SLOWVT: 1
TZAT-0001SLOWVT: 2
TZAT-0002ATACH: NEGATIVE
TZAT-0002ATACH: NEGATIVE
TZAT-0002FASTVT: NEGATIVE
TZAT-0004SLOWVT: 8
TZAT-0004SLOWVT: 8
TZAT-0005SLOWVT: 88 pct
TZAT-0005SLOWVT: 91 pct
TZAT-0011SLOWVT: 10 ms
TZAT-0011SLOWVT: 10 ms
TZAT-0012ATACH: 150 ms
TZAT-0012SLOWVT: 170 ms
TZAT-0013SLOWVT: 2
TZAT-0013SLOWVT: 2
TZAT-0018ATACH: NEGATIVE
TZAT-0018ATACH: NEGATIVE
TZAT-0018FASTVT: NEGATIVE
TZAT-0018SLOWVT: NEGATIVE
TZAT-0018SLOWVT: NEGATIVE
TZAT-0019ATACH: 6 V
TZAT-0019ATACH: 6 V
TZAT-0019FASTVT: 8 V
TZAT-0020ATACH: 1.5 ms
TZON-0003ATACH: 350 ms
TZON-0003SLOWVT: 310 ms
TZON-0004SLOWVT: 32
TZON-0004VSLOWVT: 36
TZON-0005SLOWVT: 12
TZST-0001ATACH: 4
TZST-0001FASTVT: 2
TZST-0001FASTVT: 3
TZST-0001FASTVT: 6
TZST-0001SLOWVT: 3
TZST-0001SLOWVT: 4
TZST-0001SLOWVT: 6
TZST-0002ATACH: NEGATIVE
TZST-0002FASTVT: NEGATIVE
TZST-0002FASTVT: NEGATIVE
TZST-0002FASTVT: NEGATIVE
TZST-0003SLOWVT: 35 J
TZST-0003SLOWVT: 35 J
VF: 0

## 2013-04-22 NOTE — Progress Notes (Signed)
ICD check with ICM in clinic. 

## 2013-04-28 ENCOUNTER — Ambulatory Visit (INDEPENDENT_AMBULATORY_CARE_PROVIDER_SITE_OTHER): Payer: Medicare Other | Admitting: Cardiology

## 2013-04-28 ENCOUNTER — Encounter: Payer: Self-pay | Admitting: Cardiology

## 2013-04-28 VITALS — BP 140/84 | HR 86 | Wt 235.0 lb

## 2013-04-28 DIAGNOSIS — I4891 Unspecified atrial fibrillation: Secondary | ICD-10-CM | POA: Diagnosis not present

## 2013-04-28 DIAGNOSIS — I429 Cardiomyopathy, unspecified: Secondary | ICD-10-CM

## 2013-04-28 DIAGNOSIS — Z95810 Presence of automatic (implantable) cardiac defibrillator: Secondary | ICD-10-CM

## 2013-04-28 DIAGNOSIS — I10 Essential (primary) hypertension: Secondary | ICD-10-CM | POA: Diagnosis not present

## 2013-04-28 DIAGNOSIS — I428 Other cardiomyopathies: Secondary | ICD-10-CM | POA: Diagnosis not present

## 2013-04-28 DIAGNOSIS — I509 Heart failure, unspecified: Secondary | ICD-10-CM

## 2013-04-28 LAB — CBC WITH DIFFERENTIAL/PLATELET
Basophils Relative: 0.3 % (ref 0.0–3.0)
Eosinophils Absolute: 0.3 10*3/uL (ref 0.0–0.7)
MCHC: 33.6 g/dL (ref 30.0–36.0)
MCV: 92.8 fl (ref 78.0–100.0)
Monocytes Absolute: 0.6 10*3/uL (ref 0.1–1.0)
Neutrophils Relative %: 59.6 % (ref 43.0–77.0)
Platelets: 114 10*3/uL — ABNORMAL LOW (ref 150.0–400.0)
RBC: 4.36 Mil/uL (ref 4.22–5.81)
RDW: 15 % — ABNORMAL HIGH (ref 11.5–14.6)

## 2013-04-28 LAB — BASIC METABOLIC PANEL
BUN: 16 mg/dL (ref 6–23)
CO2: 30 mEq/L (ref 19–32)
Chloride: 102 mEq/L (ref 96–112)
Creatinine, Ser: 0.7 mg/dL (ref 0.4–1.5)
Glucose, Bld: 99 mg/dL (ref 70–99)

## 2013-04-28 MED ORDER — CARVEDILOL 12.5 MG PO TABS: 12.5000 mg | ORAL_TABLET | Freq: Two times a day (BID) | ORAL | Status: DC

## 2013-04-28 NOTE — Assessment & Plan Note (Signed)
Continue present dose of Lasix. 

## 2013-04-28 NOTE — Assessment & Plan Note (Signed)
Continue present blood pressure medications. Check potassium and renal function. 

## 2013-04-28 NOTE — Assessment & Plan Note (Signed)
Continue ACE inhibitor. Increase carvedilol to 12.5 mg by mouth twice a day.

## 2013-04-28 NOTE — Assessment & Plan Note (Signed)
Continue carvedilol and Coumadin. Check hemoglobin.

## 2013-04-28 NOTE — Patient Instructions (Addendum)
Your physician wants you to follow-up in: 6 MONTHS WITH DR Jens Som You will receive a reminder letter in the mail two months in advance. If you don't receive a letter, please call our office to schedule the follow-up appointment.   Your physician recommends that you HAVE LAB WORK TODAY  INCREASE CARVEDILOL 12.5 MG TWICE DAILY

## 2013-04-28 NOTE — Assessment & Plan Note (Signed)
Management per electrophysiology. 

## 2013-04-28 NOTE — Progress Notes (Signed)
HPI: Pleasant gentleman with a history of nonischemic cardiomyopathy as well as atrial fibrillation for fu. Note, he had a catheterization in January 2006 that showed no coronary artery disease and an ejection fraction of 40%. He had a Myoview performed last on May 04, 2008, that showed an ejection fraction of 31%. There was felt to be a prior inferior infarct with mild peri-infarct ischemia. I did review this and felt there was a low risk and we have been treating medically. He does have permanent atrial fibrillation as well. Holter monitor in August of 2011 showed mildly reduced rate and we decreased his Coreg. His last echocardiogram was performed in August of 2013. His ejection fraction was 30-35%. There was mild to moderate mitral regurgitation. The left atrium was mild to moderately dilated as was the right atrium. In November of 2013 the patient had a biventricular ICD placed. Since I last saw him in Dec 2013, he denies dyspnea, chest pain, syncope or bleeding. Mild pedal edema.   Current Outpatient Prescriptions  Medication Sig Dispense Refill  . acetaminophen (TYLENOL) 325 MG tablet Take 650 mg by mouth every 6 (six) hours as needed. For pain      . allopurinol (ZYLOPRIM) 100 MG tablet Take 100 mg by mouth daily.      . carvedilol (COREG) 6.25 MG tablet Take 1 tablet (6.25 mg total) by mouth 2 (two) times daily with a meal.  60 tablet  12  . furosemide (LASIX) 80 MG tablet Take 80 mg by mouth daily.      [name] lisinopril (PRINIVIL,ZESTRIL) 40 MG tablet Take 1 tablet (40 mg total) by mouth daily.  30 tablet  1  . metolazone (ZAROXOLYN) 2.5 MG tablet Take 1 tablet (2.5 mg total) by mouth daily.  30 tablet  0  . potassium chloride 40 MEQ/15ML (20%) LIQD TAKE 15 MLS BY MOUTH FOUR TIMES DAILY  1800 mL  3  . pravastatin (PRAVACHOL) 40 MG tablet Take 1 tablet (40 mg total) by mouth daily.  30 tablet  5  . Tamsulosin HCl (FLOMAX) 0.4 MG CAPS Take 0.4 mg by mouth 2 (two) times daily.      [name] warfarin  (COUMADIN) 5 MG tablet TAKE AS DIRECTED BY COUMADIN CLINIC  30 tablet  3   No current facility-administered medications for this visit.     Past Medical History  Diagnosis Date  . CHF (congestive heart failure)   . HTN (hypertension)   . OA (osteoarthritis)   . BPH (benign prostatic hyperplasia)   . Obesity   . Anemia   . Diabetes mellitus type II   . Atrial fibrillation   . Cardiomyopathy   . Hyperlipidemia   . Peptic ulcer   . Bradycardia   . History of colonoscopy   . ICD (implantable cardiac defibrillator) in place 10/14/2012    biventricular  . GERD (gastroesophageal reflux disease)     Past Surgical History  Procedure Laterality Date  . L-spine  1965  . Total knee arthroplasty  1997    right  . Lumbar l4-5 & s1  02/2000  . Esophagogastroduodenoscopy  06/24/2002    History   Social History  . Marital Status: Married    Spouse Name: N/A    Number of Children: N/A  . Years of Education: N/A   Occupational History  . Retired    Social History Main Topics  . Smoking status: Never Smoker   . Smokeless tobacco: Never Used  . Alcohol Use: No  .  Drug Use: No  . Sexually Active: Not on file   Other Topics Concern  . Not on file   Social History Narrative  . No narrative on file    ROS: no fevers or chills, productive cough, hemoptysis, dysphasia, odynophagia, melena, hematochezia, dysuria, hematuria, rash, seizure activity, orthopnea, PND, pedal edema, claudication. Remaining systems are negative.  Physical Exam: Well-developed well-nourished in no acute distress.  Skin is warm and dry.  HEENT is normal.  Neck is supple.  Chest is clear to auscultation with normal expansion. ICD left chest Cardiovascular exam is irregular Abdominal exam nontender or distended. No masses palpated. Extremities show 1+ ankle edema. neuro grossly intact

## 2013-04-28 NOTE — Assessment & Plan Note (Signed)
Continue statin. 

## 2013-05-07 ENCOUNTER — Other Ambulatory Visit: Payer: Self-pay | Admitting: Endocrinology

## 2013-05-13 ENCOUNTER — Other Ambulatory Visit: Payer: Self-pay

## 2013-05-26 ENCOUNTER — Ambulatory Visit (INDEPENDENT_AMBULATORY_CARE_PROVIDER_SITE_OTHER): Payer: Medicare Other | Admitting: *Deleted

## 2013-05-26 ENCOUNTER — Encounter: Payer: Self-pay | Admitting: Internal Medicine

## 2013-05-26 DIAGNOSIS — I4891 Unspecified atrial fibrillation: Secondary | ICD-10-CM

## 2013-05-26 DIAGNOSIS — Z7901 Long term (current) use of anticoagulants: Secondary | ICD-10-CM | POA: Diagnosis not present

## 2013-06-08 ENCOUNTER — Other Ambulatory Visit: Payer: Self-pay | Admitting: Endocrinology

## 2013-06-11 ENCOUNTER — Telehealth: Payer: Self-pay | Admitting: Endocrinology

## 2013-06-11 MED ORDER — METOLAZONE 2.5 MG PO TABS: 2.5000 mg | ORAL_TABLET | Freq: Every day | ORAL | Status: DC

## 2013-06-11 MED ORDER — TAMSULOSIN HCL 0.4 MG PO CAPS: 0.4000 mg | ORAL_CAPSULE | Freq: Two times a day (BID) | ORAL | Status: DC

## 2013-06-11 MED ORDER — LISINOPRIL 40 MG PO TABS: 40.0000 mg | ORAL_TABLET | Freq: Every day | ORAL | Status: DC

## 2013-06-11 MED ORDER — ALLOPURINOL 100 MG PO TABS: 100.0000 mg | ORAL_TABLET | Freq: Every day | ORAL | Status: DC

## 2013-06-11 NOTE — Telephone Encounter (Signed)
Needs med refill before Dr. George Hugh return. Please call. / Sherri

## 2013-07-07 ENCOUNTER — Ambulatory Visit (INDEPENDENT_AMBULATORY_CARE_PROVIDER_SITE_OTHER): Payer: Medicare Other | Admitting: *Deleted

## 2013-07-07 DIAGNOSIS — Z7901 Long term (current) use of anticoagulants: Secondary | ICD-10-CM | POA: Diagnosis not present

## 2013-07-07 DIAGNOSIS — I4891 Unspecified atrial fibrillation: Secondary | ICD-10-CM | POA: Diagnosis not present

## 2013-07-22 ENCOUNTER — Ambulatory Visit (INDEPENDENT_AMBULATORY_CARE_PROVIDER_SITE_OTHER): Payer: Medicare Other | Admitting: *Deleted

## 2013-07-22 ENCOUNTER — Encounter: Payer: Self-pay | Admitting: Internal Medicine

## 2013-07-22 DIAGNOSIS — I509 Heart failure, unspecified: Secondary | ICD-10-CM | POA: Diagnosis not present

## 2013-07-22 DIAGNOSIS — I428 Other cardiomyopathies: Secondary | ICD-10-CM | POA: Diagnosis not present

## 2013-07-22 LAB — ICD DEVICE OBSERVATION
ATRIAL PACING ICD: 0 pct
FVT: 0
LV LEAD IMPEDENCE ICD: 627 Ohm
PACEART VT: 0
TOT-0006: 20131105000000
TZAT-0001ATACH: 1
TZAT-0001ATACH: 3
TZAT-0001FASTVT: 1
TZAT-0001SLOWVT: 1
TZAT-0001SLOWVT: 2
TZAT-0002ATACH: NEGATIVE
TZAT-0002ATACH: NEGATIVE
TZAT-0002FASTVT: NEGATIVE
TZAT-0005SLOWVT: 91 pct
TZAT-0012ATACH: 150 ms
TZAT-0012ATACH: 150 ms
TZAT-0012SLOWVT: 170 ms
TZAT-0012SLOWVT: 170 ms
TZAT-0013SLOWVT: 2
TZAT-0013SLOWVT: 2
TZAT-0018ATACH: NEGATIVE
TZAT-0018ATACH: NEGATIVE
TZAT-0018ATACH: NEGATIVE
TZAT-0018SLOWVT: NEGATIVE
TZAT-0018SLOWVT: NEGATIVE
TZAT-0019ATACH: 6 V
TZAT-0020ATACH: 1.5 ms
TZAT-0020SLOWVT: 1.5 ms
TZAT-0020SLOWVT: 1.5 ms
TZON-0003ATACH: 350 ms
TZON-0003SLOWVT: 310 ms
TZON-0004SLOWVT: 32
TZON-0004VSLOWVT: 36
TZON-0005SLOWVT: 12
TZST-0001ATACH: 4
TZST-0001ATACH: 5
TZST-0001ATACH: 6
TZST-0001FASTVT: 3
TZST-0001FASTVT: 4
TZST-0001SLOWVT: 3
TZST-0001SLOWVT: 6
TZST-0002ATACH: NEGATIVE
TZST-0002ATACH: NEGATIVE
TZST-0002FASTVT: NEGATIVE
TZST-0002FASTVT: NEGATIVE
TZST-0002FASTVT: NEGATIVE
TZST-0003SLOWVT: 35 J
TZST-0003SLOWVT: 35 J
VENTRICULAR PACING ICD: 89.95 pct
VF: 0

## 2013-07-22 NOTE — Progress Notes (Signed)
ICD check with ICM in office. 

## 2013-07-28 ENCOUNTER — Ambulatory Visit (INDEPENDENT_AMBULATORY_CARE_PROVIDER_SITE_OTHER): Payer: Medicare Other | Admitting: Endocrinology

## 2013-07-28 VITALS — BP 126/70 | HR 57 | Ht 67.0 in | Wt 234.0 lb

## 2013-07-28 DIAGNOSIS — R609 Edema, unspecified: Secondary | ICD-10-CM | POA: Diagnosis not present

## 2013-07-28 DIAGNOSIS — R413 Other amnesia: Secondary | ICD-10-CM

## 2013-07-28 DIAGNOSIS — E119 Type 2 diabetes mellitus without complications: Secondary | ICD-10-CM | POA: Diagnosis not present

## 2013-07-28 DIAGNOSIS — Z125 Encounter for screening for malignant neoplasm of prostate: Secondary | ICD-10-CM

## 2013-07-28 DIAGNOSIS — D649 Anemia, unspecified: Secondary | ICD-10-CM | POA: Diagnosis not present

## 2013-07-28 DIAGNOSIS — D696 Thrombocytopenia, unspecified: Secondary | ICD-10-CM | POA: Diagnosis not present

## 2013-07-28 DIAGNOSIS — I10 Essential (primary) hypertension: Secondary | ICD-10-CM

## 2013-07-28 DIAGNOSIS — Z0000 Encounter for general adult medical examination without abnormal findings: Secondary | ICD-10-CM | POA: Diagnosis not present

## 2013-07-28 DIAGNOSIS — E785 Hyperlipidemia, unspecified: Secondary | ICD-10-CM

## 2013-07-28 DIAGNOSIS — E876 Hypokalemia: Secondary | ICD-10-CM

## 2013-07-28 DIAGNOSIS — Z79899 Other long term (current) drug therapy: Secondary | ICD-10-CM

## 2013-07-28 DIAGNOSIS — I4891 Unspecified atrial fibrillation: Secondary | ICD-10-CM

## 2013-07-28 DIAGNOSIS — R7989 Other specified abnormal findings of blood chemistry: Secondary | ICD-10-CM

## 2013-07-28 LAB — HEPATIC FUNCTION PANEL
ALT: 18 U/L (ref 0–53)
AST: 26 U/L (ref 0–37)
Alkaline Phosphatase: 55 U/L (ref 39–117)
Bilirubin, Direct: 0.2 mg/dL (ref 0.0–0.3)
Total Bilirubin: 1.1 mg/dL (ref 0.3–1.2)

## 2013-07-28 LAB — URINALYSIS, ROUTINE W REFLEX MICROSCOPIC
Bilirubin Urine: NEGATIVE
Leukocytes, UA: NEGATIVE
Nitrite: NEGATIVE
Urobilinogen, UA: 0.2 (ref 0.0–1.0)
pH: 7 (ref 5.0–8.0)

## 2013-07-28 LAB — IBC PANEL
Iron: 59 ug/dL (ref 42–165)
Transferrin: 216.5 mg/dL (ref 212.0–360.0)

## 2013-07-28 LAB — BASIC METABOLIC PANEL
CO2: 28 mEq/L (ref 19–32)
Calcium: 9 mg/dL (ref 8.4–10.5)
Creatinine, Ser: 1 mg/dL (ref 0.4–1.5)
GFR: 79.61 mL/min (ref >60.00)
Sodium: 138 mEq/L (ref 135–145)

## 2013-07-28 LAB — TSH: TSH: 1.08 u[IU]/mL (ref 0.35–5.50)

## 2013-07-28 LAB — CBC WITH DIFFERENTIAL/PLATELET
Basophils Relative: 0.6 % (ref 0.0–3.0)
Eosinophils Absolute: 0.3 10*3/uL (ref 0.0–0.7)
HCT: 40.6 % (ref 39.0–52.0)
Lymphs Abs: 1.6 10*3/uL (ref 0.7–4.0)
MCHC: 33.5 g/dL (ref 30.0–36.0)
MCV: 93.8 fl (ref 78.0–100.0)
Monocytes Absolute: 0.7 10*3/uL (ref 0.1–1.0)
Neutrophils Relative %: 60.2 % (ref 43.0–77.0)
Platelets: 124 10*3/uL — ABNORMAL LOW (ref 150.0–400.0)

## 2013-07-28 LAB — LIPID PANEL
HDL: 59.9 mg/dL (ref >39.00)
LDL Cholesterol: 103 mg/dL — ABNORMAL HIGH (ref 0–99)
Total CHOL/HDL Ratio: 3

## 2013-07-28 LAB — MICROALBUMIN / CREATININE URINE RATIO: Microalb Creat Ratio: 1.4 mg/g (ref 0.0–30.0)

## 2013-07-28 LAB — HEMOGLOBIN A1C: Hgb A1c MFr Bld: 6.6 % — ABNORMAL HIGH (ref 4.6–6.5)

## 2013-07-28 LAB — URIC ACID: Uric Acid, Serum: 7 mg/dL (ref 4.0–7.8)

## 2013-07-28 MED ORDER — ATORVASTATIN CALCIUM 20 MG PO TABS: 20.0000 mg | ORAL_TABLET | Freq: Every day | ORAL | Status: DC

## 2013-07-28 NOTE — Patient Instructions (Addendum)
please consider these measures for your health:  minimize alcohol.  do not use tobacco products.  have a colonoscopy at least every 10 years from age 79.   keep firearms safely stored.  always use seat belts.  have working smoke alarms in your home.  see an eye doctor and dentist regularly.  never drive under the influence of alcohol or drugs (including prescription drugs).  those with fair skin should take precautions against the sun. good diet and exercise habits significanly improve the control of your diabetes.  please let me know if you wish to be referred to a dietician.  high blood sugar is very risky to your health.  you should see an eye doctor every year.  You are at higher than average risk for pneumonia and hepatitis-B.  You should be vaccinated against both.   it is critically important to prevent falling down (keep floor areas well-lit, dry, and free of loose objects.  If you have a cane, walker, or wheelchair, you should use it, even for short trips around the house.  Also, try not to rush).   blood tests are being requested for you today.  We'll contact you with results.   Please come back for a follow-up appointment in 6 months.

## 2013-07-28 NOTE — Progress Notes (Signed)
Subjective:    Patient ID: [name], male    DOB: 03/07/1929, 79 y.o.   MRN: [id number]  HPI The state of at least three ongoing medical problems is addressed today, with interval history of each noted here: DM: he has lost weight, due to his efforts. Dyslipidemia: he denies chest pain HTN: he denies sob Past Medical History  Diagnosis Date  . CHF (congestive heart failure)   . HTN (hypertension)   . OA (osteoarthritis)   . BPH (benign prostatic hyperplasia)   . Obesity   . Anemia   . Diabetes mellitus type II   . Atrial fibrillation   . Cardiomyopathy   . Hyperlipidemia   . Peptic ulcer   . Bradycardia   . History of colonoscopy   . ICD (implantable cardiac defibrillator) in place 10/14/2012    biventricular  . GERD (gastroesophageal reflux disease)     Past Surgical History  Procedure Laterality Date  . L-spine  1965  . Total knee arthroplasty  1997    right  . Lumbar l4-5 & s1  02/2000  . Esophagogastroduodenoscopy  06/24/2002    History   Social History  . Marital Status: Married    Spouse Name: N/A    Number of Children: N/A  . Years of Education: N/A   Occupational History  . Retired    Social History Main Topics  . Smoking status: Never Smoker   . Smokeless tobacco: Never Used  . Alcohol Use: No  . Drug Use: No  . Sexual Activity: Not on file   Other Topics Concern  . Not on file   Social History Narrative  . No narrative on file    Current Outpatient Prescriptions on File Prior to Visit  Medication Sig Dispense Refill  . acetaminophen (TYLENOL) 325 MG tablet Take 650 mg by mouth every 6 (six) hours as needed. For pain      . allopurinol (ZYLOPRIM) 100 MG tablet Take 1 tablet (100 mg total) by mouth daily.  30 tablet  1  . carvedilol (COREG) 12.5 MG tablet Take 1 tablet (12.5 mg total) by mouth 2 (two) times daily with a meal.  60 tablet  12  . furosemide (LASIX) 80 MG tablet Take 80 mg by mouth daily.      [name] lisinopril  (PRINIVIL,ZESTRIL) 40 MG tablet Take 1 tablet (40 mg total) by mouth daily.  30 tablet  1  . metolazone (ZAROXOLYN) 2.5 MG tablet Take 1 tablet (2.5 mg total) by mouth daily.  30 tablet  1  . potassium chloride 40 MEQ/15ML (20%) LIQD TAKE 15 MLS BY MOUTH FOUR TIMES DAILY  1800 mL  3  . tamsulosin (FLOMAX) 0.4 MG CAPS Take 1 capsule (0.4 mg total) by mouth 2 (two) times daily.  30 capsule  1  . warfarin (COUMADIN) 5 MG tablet TAKE AS DIRECTED BY COUMADIN CLINIC  30 tablet  3   No current facility-administered medications on file prior to visit.    No Known Allergies  Family History  Problem Relation Age of Onset  . Cancer Neg Hx   . Heart disease Father   . Heart disease Mother   . Diabetes Mother   . Hypertension Brother   . Hypertension Brother     BP 126/70  Pulse 57  Ht 5\' 7"  (1.702 m)  Wt 234 lb (106.142 kg)  BMI 36.64 kg/m2  SpO2 97%  Review of Systems Denies muscle cramps.  He has chronic left knee pain  Objective:   Physical Exam VITAL SIGNS:  See vs page GENERAL: no distress    Lab Results  Component Value Date   WBC 6.7 07/28/2013   HGB 13.6 07/28/2013   HCT 40.6 07/28/2013   PLT 124.0* 07/28/2013   GLUCOSE 122* 07/28/2013   CHOL 176 07/28/2013   TRIG 65.0 07/28/2013   HDL 59.90 07/28/2013   LDLCALC 103* 07/28/2013   ALT 18 07/28/2013   AST 26 07/28/2013   NA 138 07/28/2013   K 4.1 07/28/2013   CL 104 07/28/2013   CREATININE 1.0 07/28/2013   BUN 30* 07/28/2013   CO2 28 07/28/2013   TSH 1.08 07/28/2013   PSA 1.17 07/28/2013   INR 2.4 07/07/2013   HGBA1C 6.6* 07/28/2013   MICROALBUR 1.4 07/28/2013      Assessment & Plan:  Dyslipidemia, he needs increased rx DM: well-controlled HTN: well-controlled   Subjective:   Patient here for Medicare annual wellness visit and management of other chronic and acute problems.     Risk factors: advanced age    Roster of Physicians Providing Medical Care to Patient:  See "snapshot"   Activities of Daily Living: In  your present state of health, do you have any difficulty performing the following activities?:  Preparing food and eating?: No  Bathing yourself: No  Getting dressed: No  Using the toilet:No  Moving around from place to place: No  In the past year have you fallen or had a near fall?: No    Home Safety: Has smoke detector and wears seat belts. Firearms are safely stored. No excess sun exposure.  Diet and Exercise  Current exercise habits: limited by health problems  Dietary issues discussed: pt reports a healthy diet   Depression Screen  Q1: Over the past two weeks, have you felt down, depressed or hopeless? no  Q2: Over the past two weeks, have you felt little interest or pleasure in doing things? no   The following portions of the patient's history were reviewed and updated as appropriate: allergies, current medications, past family history, past medical history, past social history, past surgical history and problem list.  Past Medical History  Diagnosis Date  . CHF (congestive heart failure)   . HTN (hypertension)   . OA (osteoarthritis)   . BPH (benign prostatic hyperplasia)   . Obesity   . Anemia   . Diabetes mellitus type II   . Atrial fibrillation   . Cardiomyopathy   . Hyperlipidemia   . Peptic ulcer   . Bradycardia   . History of colonoscopy   . ICD (implantable cardiac defibrillator) in place 10/14/2012    biventricular  . GERD (gastroesophageal reflux disease)     Past Surgical History  Procedure Laterality Date  . L-spine  1965  . Total knee arthroplasty  1997    right  . Lumbar l4-5 & s1  02/2000  . Esophagogastroduodenoscopy  06/24/2002    History   Social History  . Marital Status: Married    Spouse Name: N/A    Number of Children: N/A  . Years of Education: N/A   Occupational History  . Retired    Social History Main Topics  . Smoking status: Never Smoker   . Smokeless tobacco: Never Used  . Alcohol Use: No  . Drug Use: No  . Sexual  Activity: Not on file   Other Topics Concern  . Not on file   Social History Narrative  . No narrative on file  Current Outpatient Prescriptions on File Prior to Visit  Medication Sig Dispense Refill  . acetaminophen (TYLENOL) 325 MG tablet Take 650 mg by mouth every 6 (six) hours as needed. For pain      . allopurinol (ZYLOPRIM) 100 MG tablet Take 1 tablet (100 mg total) by mouth daily.  30 tablet  1  . carvedilol (COREG) 12.5 MG tablet Take 1 tablet (12.5 mg total) by mouth 2 (two) times daily with a meal.  60 tablet  12  . furosemide (LASIX) 80 MG tablet Take 80 mg by mouth daily.      [name] lisinopril (PRINIVIL,ZESTRIL) 40 MG tablet Take 1 tablet (40 mg total) by mouth daily.  30 tablet  1  . metolazone (ZAROXOLYN) 2.5 MG tablet Take 1 tablet (2.5 mg total) by mouth daily.  30 tablet  1  . potassium chloride 40 MEQ/15ML (20%) LIQD TAKE 15 MLS BY MOUTH FOUR TIMES DAILY  1800 mL  3  . pravastatin (PRAVACHOL) 40 MG tablet Take 1 tablet (40 mg total) by mouth daily.  30 tablet  5  . tamsulosin (FLOMAX) 0.4 MG CAPS Take 1 capsule (0.4 mg total) by mouth 2 (two) times daily.  30 capsule  1  . warfarin (COUMADIN) 5 MG tablet TAKE AS DIRECTED BY COUMADIN CLINIC  30 tablet  3   No current facility-administered medications on file prior to visit.    No Known Allergies  Family History  Problem Relation Age of Onset  . Cancer Neg Hx   . Heart disease Father   . Heart disease Mother   . Diabetes Mother   . Hypertension Brother   . Hypertension Brother     There were no vitals taken for this visit.   Review of Systems  Denies hearing loss, and visual loss Objective:   Vision:  Sees opthalmologist Hearing: grossly normal Body mass index:  See vs page Msk: pt slowly performs "get-up-and-go" from a sitting position.   Cognitive Impairment Assessment: cognition, memory and judgment appear normal.  remembers 2/3 at 5 minutes.  excellent recall.  can easily read and write a sentence.   alert and oriented x 3   Assessment:   Medicare wellness utd on preventive parameters    Plan:   During the course of the visit the patient was educated and counseled about appropriate screening and preventive services including:        Fall prevention   Diabetes screening  Nutrition counseling   Vaccines / LABS Zostavax / Pnemonccoal Vaccine  today  PSA  Patient Instructions (the written plan) was given to the patient.   we discussed code status.  pt requests full code, but would not want to be started or maintained on artificial life-support measures if there was not a reasonable chance of recovery

## 2013-08-08 ENCOUNTER — Other Ambulatory Visit: Payer: Self-pay | Admitting: Endocrinology

## 2013-08-11 ENCOUNTER — Other Ambulatory Visit: Payer: Self-pay | Admitting: *Deleted

## 2013-08-11 MED ORDER — TAMSULOSIN HCL 0.4 MG PO CAPS: 0.4000 mg | ORAL_CAPSULE | Freq: Two times a day (BID) | ORAL | Status: DC

## 2013-08-11 MED ORDER — LISINOPRIL 40 MG PO TABS: 40.0000 mg | ORAL_TABLET | Freq: Every day | ORAL | Status: DC

## 2013-08-11 MED ORDER — METOLAZONE 2.5 MG PO TABS: 2.5000 mg | ORAL_TABLET | Freq: Every day | ORAL | Status: DC

## 2013-08-18 ENCOUNTER — Ambulatory Visit (INDEPENDENT_AMBULATORY_CARE_PROVIDER_SITE_OTHER): Payer: Medicare Other | Admitting: *Deleted

## 2013-08-18 DIAGNOSIS — Z7901 Long term (current) use of anticoagulants: Secondary | ICD-10-CM

## 2013-08-18 DIAGNOSIS — I4891 Unspecified atrial fibrillation: Secondary | ICD-10-CM | POA: Diagnosis not present

## 2013-08-18 LAB — POCT INR: INR: 2.4

## 2013-08-24 ENCOUNTER — Other Ambulatory Visit: Payer: Self-pay | Admitting: Cardiology

## 2013-08-24 ENCOUNTER — Other Ambulatory Visit: Payer: Self-pay | Admitting: Endocrinology

## 2013-08-27 DIAGNOSIS — Z23 Encounter for immunization: Secondary | ICD-10-CM | POA: Diagnosis not present

## 2013-09-07 ENCOUNTER — Other Ambulatory Visit: Payer: Self-pay | Admitting: Endocrinology

## 2013-09-07 ENCOUNTER — Other Ambulatory Visit: Payer: Self-pay | Admitting: Cardiology

## 2013-09-29 ENCOUNTER — Ambulatory Visit (INDEPENDENT_AMBULATORY_CARE_PROVIDER_SITE_OTHER): Payer: Medicare Other | Admitting: Pharmacist

## 2013-09-29 DIAGNOSIS — I4891 Unspecified atrial fibrillation: Secondary | ICD-10-CM

## 2013-09-29 DIAGNOSIS — Z7901 Long term (current) use of anticoagulants: Secondary | ICD-10-CM | POA: Diagnosis not present

## 2013-10-06 ENCOUNTER — Other Ambulatory Visit: Payer: Self-pay | Admitting: Endocrinology

## 2013-10-16 ENCOUNTER — Other Ambulatory Visit: Payer: Self-pay | Admitting: Cardiology

## 2013-10-16 ENCOUNTER — Telehealth: Payer: Self-pay | Admitting: Internal Medicine

## 2013-10-16 NOTE — Telephone Encounter (Signed)
New problem:  Pt's daughter is calling in asking if it is medically ok for her father to post pone his device check... Please advise

## 2013-10-16 NOTE — Telephone Encounter (Signed)
Pt out of town 10/21/13, rescheduled appt to 10/23/13.

## 2013-10-20 ENCOUNTER — Telehealth: Payer: Self-pay | Admitting: Cardiology

## 2013-10-20 NOTE — Telephone Encounter (Signed)
Spoke with pt wife, aware I have not called.

## 2013-10-20 NOTE — Telephone Encounter (Signed)
New Problem:  Pt's wife is calling saying someone called. I informed her of her husband's appt and I'm sending this message to the nurse to see if anyone was calling in reference to something else. Please advise

## 2013-10-23 ENCOUNTER — Encounter: Payer: Self-pay | Admitting: Internal Medicine

## 2013-10-23 ENCOUNTER — Ambulatory Visit (INDEPENDENT_AMBULATORY_CARE_PROVIDER_SITE_OTHER): Payer: Medicare Other | Admitting: *Deleted

## 2013-10-23 DIAGNOSIS — I428 Other cardiomyopathies: Secondary | ICD-10-CM

## 2013-10-23 DIAGNOSIS — I509 Heart failure, unspecified: Secondary | ICD-10-CM | POA: Diagnosis not present

## 2013-10-23 LAB — MDC_IDC_ENUM_SESS_TYPE_INCLINIC
Brady Statistic AP VP Percent: 0 %
Brady Statistic AS VP Percent: 88.58 %
Brady Statistic AS VS Percent: 11.42 %
Date Time Interrogation Session: 20141114102445
HighPow Impedance: 171 Ohm
HighPow Impedance: 59 Ohm
Lead Channel Impedance Value: 342 Ohm
Lead Channel Impedance Value: 399 Ohm
Lead Channel Impedance Value: 399 Ohm
Lead Channel Sensing Intrinsic Amplitude: 0.375 mV
Lead Channel Sensing Intrinsic Amplitude: 0.375 mV
Lead Channel Sensing Intrinsic Amplitude: 3.875 mV
Lead Channel Setting Pacing Amplitude: 2.5 V
Lead Channel Setting Sensing Sensitivity: 0.3 mV
Zone Setting Detection Interval: 270 ms
Zone Setting Detection Interval: 310 ms
Zone Setting Detection Interval: 350 ms

## 2013-10-23 NOTE — Progress Notes (Signed)
CRT-D device check in office. Thresholds and sensing consistent with previous device measurements. Lead impedance trends stable over time. No mode switch episodes recorded. No ventricular arrhythmia episodes recorded. Patient bi-ventricularly pacing >88.6 % of the time. Device programmed with appropriate safety margins. Heart failure diagnostics reviewed and trends are stable for patient. Audible/vibratory alerts demonstrated for patient. No changes made this session.   Patient enrolled in remote follow up. Plan to check device remotely in 3 months and see in office in 6 months. Patient education completed including shock plan.  ROV in February with Dr. Ladona Ridgel.

## 2013-11-10 ENCOUNTER — Ambulatory Visit (INDEPENDENT_AMBULATORY_CARE_PROVIDER_SITE_OTHER): Payer: Medicare Other | Admitting: *Deleted

## 2013-11-10 DIAGNOSIS — Z7901 Long term (current) use of anticoagulants: Secondary | ICD-10-CM

## 2013-11-10 DIAGNOSIS — I4891 Unspecified atrial fibrillation: Secondary | ICD-10-CM

## 2013-11-10 LAB — POCT INR: INR: 2.3

## 2013-12-10 ENCOUNTER — Other Ambulatory Visit: Payer: Self-pay | Admitting: Endocrinology

## 2013-12-10 ENCOUNTER — Other Ambulatory Visit: Payer: Self-pay | Admitting: Cardiology

## 2013-12-11 ENCOUNTER — Other Ambulatory Visit: Payer: Self-pay | Admitting: *Deleted

## 2013-12-11 MED ORDER — LISINOPRIL 40 MG PO TABS: 40.0000 mg | ORAL_TABLET | Freq: Every day | ORAL | Status: DC

## 2013-12-14 ENCOUNTER — Other Ambulatory Visit: Payer: Self-pay | Admitting: Endocrinology

## 2013-12-22 ENCOUNTER — Ambulatory Visit (INDEPENDENT_AMBULATORY_CARE_PROVIDER_SITE_OTHER): Payer: Medicare Other

## 2013-12-22 DIAGNOSIS — Z5181 Encounter for therapeutic drug level monitoring: Secondary | ICD-10-CM

## 2013-12-22 DIAGNOSIS — I4891 Unspecified atrial fibrillation: Secondary | ICD-10-CM

## 2013-12-22 DIAGNOSIS — Z7901 Long term (current) use of anticoagulants: Secondary | ICD-10-CM

## 2013-12-22 LAB — POCT INR: INR: 3

## 2014-01-22 ENCOUNTER — Ambulatory Visit (INDEPENDENT_AMBULATORY_CARE_PROVIDER_SITE_OTHER): Payer: Medicare Other | Admitting: Internal Medicine

## 2014-01-22 ENCOUNTER — Encounter: Payer: Self-pay | Admitting: Internal Medicine

## 2014-01-22 VITALS — BP 132/88 | HR 86 | Ht 67.0 in | Wt 232.0 lb

## 2014-01-22 DIAGNOSIS — I4891 Unspecified atrial fibrillation: Secondary | ICD-10-CM

## 2014-01-22 DIAGNOSIS — Z95810 Presence of automatic (implantable) cardiac defibrillator: Secondary | ICD-10-CM | POA: Diagnosis not present

## 2014-01-22 DIAGNOSIS — I509 Heart failure, unspecified: Secondary | ICD-10-CM | POA: Diagnosis not present

## 2014-01-22 LAB — MDC_IDC_ENUM_SESS_TYPE_INCLINIC
Battery Voltage: 3.05 V
Brady Statistic AS VP Percent: 90.18 %
Brady Statistic RA Percent Paced: 0 %
Date Time Interrogation Session: 20150213152308
HIGH POWER IMPEDANCE MEASURED VALUE: 171 Ohm
HIGH POWER IMPEDANCE MEASURED VALUE: 304 Ohm
HighPow Impedance: 57 Ohm
Lead Channel Impedance Value: 342 Ohm
Lead Channel Impedance Value: 380 Ohm
Lead Channel Impedance Value: 494 Ohm
Lead Channel Impedance Value: 589 Ohm
Lead Channel Pacing Threshold Amplitude: 0.75 V
Lead Channel Pacing Threshold Amplitude: 1.5 V
Lead Channel Pacing Threshold Pulse Width: 0.4 ms
Lead Channel Pacing Threshold Pulse Width: 1.5 ms
Lead Channel Sensing Intrinsic Amplitude: 0.375 mV
Lead Channel Setting Pacing Amplitude: 2 V
Lead Channel Setting Pacing Pulse Width: 0.4 ms
MDC IDC MSMT LEADCHNL LV IMPEDANCE VALUE: 399 Ohm
MDC IDC SET LEADCHNL LV PACING AMPLITUDE: 3.25 V
MDC IDC SET LEADCHNL LV PACING PULSEWIDTH: 1.5 ms
MDC IDC SET LEADCHNL RV SENSING SENSITIVITY: 0.3 mV
MDC IDC SET ZONE DETECTION INTERVAL: 310 ms
MDC IDC SET ZONE DETECTION INTERVAL: 350 ms
MDC IDC STAT BRADY AP VP PERCENT: 0 %
MDC IDC STAT BRADY AP VS PERCENT: 0 %
MDC IDC STAT BRADY AS VS PERCENT: 9.82 %
MDC IDC STAT BRADY RV PERCENT PACED: 90.18 %
Zone Setting Detection Interval: 270 ms
Zone Setting Detection Interval: 400 ms

## 2014-01-22 NOTE — Assessment & Plan Note (Signed)
His device is working normally. Will recheck in several months. 

## 2014-01-22 NOTE — Patient Instructions (Signed)
Your physician wants you to follow-up in: 1 year with Dr. Ladona Ridgel.  You will receive a reminder letter in the mail two months in advance. If you don't receive a letter, please call our office to schedule the follow-up appointment.    Remote monitoring is used to monitor your Pacemaker or ICD from home. This monitoring reduces the number of office visits required to check your device to one time per year. It allows [place] to keep an eye on the functioning of your device to ensure it is working properly. You are scheduled for a device check from home on 04/26/14. You may send your transmission at any time that day. If you have a wireless device, the transmission will be sent automatically. After your physician reviews your transmission, you will receive a postcard with your next transmission date.

## 2014-01-22 NOTE — Progress Notes (Signed)
HPI [name] returns today for followup. He is an 80 year old man with a nonischemic cardiomyopathy, chronic systolic heart failure, chronic atrial fibrillation, status post biventricular ICD implantation. His heart failure symptoms have improved from class III to class II. His peripheral edema is much better. He denies syncope or ICD shock. No fever or chills. He has occasional fullness in his chest. He denies chest pain.   No Known Allergies   Current Outpatient Prescriptions  Medication Sig Dispense Refill  . acetaminophen (TYLENOL) 325 MG tablet Take 650 mg by mouth every 6 (six) hours as needed. For pain      . allopurinol (ZYLOPRIM) 100 MG tablet TAKE 1 TABLET BY MOUTH DAILY  30 tablet  2  . atorvastatin (LIPITOR) 20 MG tablet Take 1 tablet (20 mg total) by mouth daily.  30 tablet  11  . carvedilol (COREG) 12.5 MG tablet Take 1 tablet (12.5 mg total) by mouth 2 (two) times daily with a meal.  60 tablet  12  . furosemide (LASIX) 80 MG tablet Take 80 mg by mouth daily.      [name]. lisinopril (PRINIVIL,ZESTRIL) 40 MG tablet Take 1 tablet (40 mg total) by mouth daily.  30 tablet  3  . metolazone (ZAROXOLYN) 2.5 MG tablet TAKE 1 TABLET BY MOUTH EVERY DAY  30 tablet  1  . potassium chloride 40 MEQ/15ML (20%) LIQD TAKE 15 ML BY MOUTH FOUR TIMES DAILY  1800 mL  0  . pravastatin (PRAVACHOL) 40 MG tablet Take 40 mg by mouth daily.      . tamsulosin (FLOMAX) 0.4 MG CAPS capsule Take 1 capsule (0.4 mg total) by mouth 2 (two) times daily.  60 capsule  3  . warfarin (COUMADIN) 5 MG tablet TAKE AS DIRECTED BY COUMADIN CLINIC  30 tablet  3   No current facility-administered medications for this visit.     Past Medical History  Diagnosis Date  . CHF (congestive heart failure)   . HTN (hypertension)   . OA (osteoarthritis)   . BPH (benign prostatic hyperplasia)   . Obesity   . Anemia   . Diabetes mellitus type II   . Atrial fibrillation   . Cardiomyopathy   . Hyperlipidemia   . Peptic ulcer    . Bradycardia   . History of colonoscopy   . ICD (implantable cardiac defibrillator) in place 10/14/2012    biventricular  . GERD (gastroesophageal reflux disease)     ROS:   All systems reviewed and negative except as noted in the HPI.   Past Surgical History  Procedure Laterality Date  . L-spine  1965  . Total knee arthroplasty  1997    right  . Lumbar l4-5 & s1  02/2000  . Esophagogastroduodenoscopy  06/24/2002     Family History  Problem Relation Age of Onset  . Cancer Neg Hx   . Heart disease Father   . Heart disease Mother   . Diabetes Mother   . Hypertension Brother   . Hypertension Brother      History   Social History  . Marital Status: Married    Spouse Name: N/A    Number of Children: N/A  . Years of Education: N/A   Occupational History  . Retired    Social History Main Topics  . Smoking status: Never Smoker   . Smokeless tobacco: Never Used  . Alcohol Use: No  . Drug Use: No  . Sexual Activity: Not on file   Other Topics Concern  .  Not on file   Social History Narrative  . No narrative on file     BP 132/88  Pulse 86  Ht 5\' 7"  (1.702 m)  Wt 232 lb (105.235 kg)  BMI 36.33 kg/m2  Physical Exam:  Well appearing 80 year old man,NAD HEENT: Unremarkable Neck:  7 cm JVD, no thyromegally Lungs:  Clearwith no wheezes, rales, or rhonchi. Well-healed ICD incision. HEART:  Regular rate rhythm, no murmurs, no rubs, no clicks Abd:  soft, positive bowel sounds, no organomegally, no rebound, no guarding Ext:  2 plus pulses, no edema, no cyanosis, no clubbing Skin:  No rashes no nodules Neuro:  CN II through XII intact, motor grossly intact  DEVICE  Normal device function.  See PaceArt for details.   Assess/Plan:

## 2014-01-22 NOTE — Assessment & Plan Note (Signed)
His chronic systolic heart failure is class 2. He will continue his current meds. 

## 2014-01-27 ENCOUNTER — Other Ambulatory Visit: Payer: Self-pay | Admitting: Cardiology

## 2014-02-01 ENCOUNTER — Encounter: Payer: Self-pay | Admitting: Endocrinology

## 2014-02-01 ENCOUNTER — Ambulatory Visit (INDEPENDENT_AMBULATORY_CARE_PROVIDER_SITE_OTHER): Payer: Medicare Other | Admitting: Endocrinology

## 2014-02-01 VITALS — BP 120/60 | HR 63 | Temp 99.2°F | Ht 67.0 in | Wt 228.0 lb

## 2014-02-01 DIAGNOSIS — R059 Cough, unspecified: Secondary | ICD-10-CM | POA: Diagnosis not present

## 2014-02-01 DIAGNOSIS — R0609 Other forms of dyspnea: Secondary | ICD-10-CM | POA: Diagnosis not present

## 2014-02-01 DIAGNOSIS — R05 Cough: Secondary | ICD-10-CM | POA: Diagnosis not present

## 2014-02-01 DIAGNOSIS — I4891 Unspecified atrial fibrillation: Secondary | ICD-10-CM | POA: Diagnosis not present

## 2014-02-01 DIAGNOSIS — R0600 Dyspnea, unspecified: Secondary | ICD-10-CM

## 2014-02-01 DIAGNOSIS — Z7901 Long term (current) use of anticoagulants: Secondary | ICD-10-CM

## 2014-02-01 DIAGNOSIS — E119 Type 2 diabetes mellitus without complications: Secondary | ICD-10-CM

## 2014-02-01 DIAGNOSIS — R0989 Other specified symptoms and signs involving the circulatory and respiratory systems: Secondary | ICD-10-CM

## 2014-02-01 MED ORDER — BENZONATATE 100 MG PO CAPS: 100.0000 mg | ORAL_CAPSULE | Freq: Three times a day (TID) | ORAL | Status: DC | PRN

## 2014-02-01 MED ORDER — FLUTICASONE-SALMETEROL 100-50 MCG/DOSE IN AEPB
1.0000 | INHALATION_SPRAY | Freq: Two times a day (BID) | RESPIRATORY_TRACT | Status: DC
Start: 2014-02-01 — End: 2014-02-13

## 2014-02-01 MED ORDER — CEFUROXIME AXETIL 250 MG PO TABS: 250.0000 mg | ORAL_TABLET | Freq: Two times a day (BID) | ORAL | Status: AC

## 2014-02-01 NOTE — Progress Notes (Signed)
Subjective:    Patient ID: [name], male    DOB: 06/01/1929, 80 y.o.   MRN: [id number]  HPI Pt states 2 days of slight wheezing in the chest, and assoc prod-quality cough. Past Medical History  Diagnosis Date  . CHF (congestive heart failure)   . HTN (hypertension)   . OA (osteoarthritis)   . BPH (benign prostatic hyperplasia)   . Obesity   . Anemia   . Diabetes mellitus type II   . Atrial fibrillation   . Cardiomyopathy   . Hyperlipidemia   . Peptic ulcer   . Bradycardia   . History of colonoscopy   . ICD (implantable cardiac defibrillator) in place 10/14/2012    biventricular  . GERD (gastroesophageal reflux disease)     Past Surgical History  Procedure Laterality Date  . L-spine  1965  . Total knee arthroplasty  1997    right  . Lumbar l4-5 & s1  02/2000  . Esophagogastroduodenoscopy  06/24/2002    History   Social History  . Marital Status: Married    Spouse Name: N/A    Number of Children: N/A  . Years of Education: N/A   Occupational History  . Retired    Social History Main Topics  . Smoking status: Never Smoker   . Smokeless tobacco: Never Used  . Alcohol Use: No  . Drug Use: No  . Sexual Activity: Not on file   Other Topics Concern  . Not on file   Social History Narrative  . No narrative on file    Current Outpatient Prescriptions on File Prior to Visit  Medication Sig Dispense Refill  . acetaminophen (TYLENOL) 325 MG tablet Take 650 mg by mouth every 6 (six) hours as needed. For pain      . allopurinol (ZYLOPRIM) 100 MG tablet TAKE 1 TABLET BY MOUTH DAILY  30 tablet  2  . atorvastatin (LIPITOR) 20 MG tablet Take 1 tablet (20 mg total) by mouth daily.  30 tablet  11  . carvedilol (COREG) 12.5 MG tablet Take 1 tablet (12.5 mg total) by mouth 2 (two) times daily with a meal.  60 tablet  12  . furosemide (LASIX) 80 MG tablet Take 80 mg by mouth daily.      [name]. lisinopril (PRINIVIL,ZESTRIL) 40 MG tablet Take 1 tablet (40 mg total) by  mouth daily.  30 tablet  3  . metolazone (ZAROXOLYN) 2.5 MG tablet TAKE 1 TABLET BY MOUTH EVERY DAY  30 tablet  1  . pravastatin (PRAVACHOL) 40 MG tablet Take 40 mg by mouth daily.      . tamsulosin (FLOMAX) 0.4 MG CAPS capsule Take 1 capsule (0.4 mg total) by mouth 2 (two) times daily.  60 capsule  3  . warfarin (COUMADIN) 5 MG tablet TAKE AS DIRECTED BY COUMADIN CLINIC  30 tablet  3   No current facility-administered medications on file prior to visit.    No Known Allergies  Family History  Problem Relation Age of Onset  . Cancer Neg Hx   . Heart disease Father   . Heart disease Mother   . Diabetes Mother   . Hypertension Brother   . Hypertension Brother     BP 120/60  Pulse 63  Temp(Src) 99.2 F (37.3 C) (Oral)  Ht 5\' 7"  (1.702 m)  Wt 228 lb (103.42 kg)  BMI 35.70 kg/m2  SpO2 93%  Review of Systems Denies fever.  He has lost a few lbs.  Objective:   Physical Exam VITAL SIGNS:  See vs page GENERAL: no distress.  In wheelchair.   head: no deformity.   eyes: no periorbital swelling, no proptosis.   external nose and ears are normal.   mouth: no lesion seen. Both eac's and tm's are normal.    LUNGS:  Clear to auscultation, except for rales at the right base. Ext: 1+ bilat leg edema  Lab Results  Component Value Date   HGBA1C 6.7* 02/01/2014   Lab Results  Component Value Date   CREATININE 0.98 02/01/2014   BUN 26* 02/01/2014   NA 138 02/01/2014   K 3.0* 02/01/2014   CL 92* 02/01/2014   CO2 33* 02/01/2014  BNP=elevated    Assessment & Plan:  DM: well-controlled Hypokalemia: he needs increased rx CHF: because this seems stable now, we'll correct hypokalemia first Acute bronchitis, new

## 2014-02-01 NOTE — Patient Instructions (Addendum)
blood tests and a chest-x-ray, are requested for you today.  We'll contact you with results. i have sent 3 prescriptions to your pharmacy: antibiotic, cough, and inhaler. I hope you feel better soon.  If you don't feel better by next week, please call back.  Please call sooner if you get worse.

## 2014-02-02 ENCOUNTER — Ambulatory Visit (INDEPENDENT_AMBULATORY_CARE_PROVIDER_SITE_OTHER): Payer: Medicare Other | Admitting: Pharmacist

## 2014-02-02 ENCOUNTER — Telehealth: Payer: Self-pay | Admitting: Cardiology

## 2014-02-02 DIAGNOSIS — I4891 Unspecified atrial fibrillation: Secondary | ICD-10-CM

## 2014-02-02 DIAGNOSIS — Z7901 Long term (current) use of anticoagulants: Secondary | ICD-10-CM

## 2014-02-02 LAB — BASIC METABOLIC PANEL
BUN: 26 mg/dL — AB (ref 6–23)
CHLORIDE: 92 meq/L — AB (ref 96–112)
CO2: 33 meq/L — AB (ref 19–32)
CREATININE: 0.98 mg/dL (ref 0.50–1.35)
Calcium: 9.1 mg/dL (ref 8.4–10.5)
Glucose, Bld: 127 mg/dL — ABNORMAL HIGH (ref 70–99)
POTASSIUM: 3 meq/L — AB (ref 3.5–5.3)
Sodium: 138 mEq/L (ref 135–145)

## 2014-02-02 LAB — HEMOGLOBIN A1C
HEMOGLOBIN A1C: 6.7 % — AB (ref <5.7)
Mean Plasma Glucose: 146 mg/dL — ABNORMAL HIGH (ref <117)

## 2014-02-02 LAB — PROTIME-INR
INR: 1.93 — ABNORMAL HIGH (ref <1.50)
PROTHROMBIN TIME: 21.6 s — AB (ref 11.6–15.2)

## 2014-02-02 LAB — BRAIN NATRIURETIC PEPTIDE: Brain Natriuretic Peptide: 329.2 pg/mL — ABNORMAL HIGH (ref 0.0–100.0)

## 2014-02-02 MED ORDER — POTASSIUM CHLORIDE 40 MEQ/15ML (20%) PO LIQD: 80.0000 meq | Freq: Four times a day (QID) | ORAL | Status: DC

## 2014-02-02 NOTE — Telephone Encounter (Signed)
Spoke with daughter and addressed INR - see anticoag encounter.

## 2014-02-02 NOTE — Telephone Encounter (Signed)
New information    Pt's daughter call to inform this office of pt's  Test.   Pt saw Dr Everardo All and had Lab work done there.

## 2014-02-03 ENCOUNTER — Ambulatory Visit
Admission: RE | Admit: 2014-02-03 | Discharge: 2014-02-03 | Disposition: A | Discharge status: Home / Self Care | Payer: Medicare Other | Source: Ambulatory Visit | Attending: Endocrinology | Admitting: Endocrinology

## 2014-02-03 ENCOUNTER — Other Ambulatory Visit: Payer: Self-pay | Admitting: Endocrinology

## 2014-02-03 DIAGNOSIS — R918 Other nonspecific abnormal finding of lung field: Secondary | ICD-10-CM | POA: Diagnosis not present

## 2014-02-03 MED ORDER — CEFUROXIME AXETIL 500 MG PO TABS: 500.0000 mg | ORAL_TABLET | Freq: Two times a day (BID) | ORAL | Status: DC

## 2014-02-05 ENCOUNTER — Other Ambulatory Visit: Payer: Self-pay | Admitting: Endocrinology

## 2014-02-12 ENCOUNTER — Ambulatory Visit (INDEPENDENT_AMBULATORY_CARE_PROVIDER_SITE_OTHER): Payer: Medicare Other | Admitting: Endocrinology

## 2014-02-12 ENCOUNTER — Encounter: Payer: Self-pay | Admitting: Endocrinology

## 2014-02-12 VITALS — BP 108/72 | HR 89 | Temp 97.7°F | Ht 67.0 in | Wt 231.0 lb

## 2014-02-12 DIAGNOSIS — J189 Pneumonia, unspecified organism: Secondary | ICD-10-CM

## 2014-02-12 DIAGNOSIS — E876 Hypokalemia: Secondary | ICD-10-CM | POA: Diagnosis not present

## 2014-02-12 LAB — BASIC METABOLIC PANEL
BUN: 15 mg/dL (ref 6–23)
CALCIUM: 9.2 mg/dL (ref 8.4–10.5)
CO2: 27 mEq/L (ref 19–32)
Chloride: 98 mEq/L (ref 96–112)
Creatinine, Ser: 0.7 mg/dL (ref 0.4–1.5)
GFR: 107.36 mL/min (ref >60.00)
Glucose, Bld: 94 mg/dL (ref 70–99)
Potassium: 3.5 mEq/L (ref 3.5–5.1)
SODIUM: 134 meq/L — AB (ref 135–145)

## 2014-02-12 NOTE — Progress Notes (Signed)
Subjective:    Patient ID: [name], male    DOB: 07/09/1929, 80 y.o.   MRN: [id number]  HPI The state of at least three ongoing medical problems is addressed today, with interval history of each noted here: Pneumonia: he denies cough, and pt states he feels better in general. Hypokalemia: he tqkes supplement as rx'ed.  He denies cramps. CHF: edema persists Past Medical History  Diagnosis Date  . CHF (congestive heart failure)   . HTN (hypertension)   . OA (osteoarthritis)   . BPH (benign prostatic hyperplasia)   . Obesity   . Anemia   . Diabetes mellitus type II   . Atrial fibrillation   . Cardiomyopathy   . Hyperlipidemia   . Peptic ulcer   . Bradycardia   . History of colonoscopy   . ICD (implantable cardiac defibrillator) in place 10/14/2012    biventricular  . GERD (gastroesophageal reflux disease)     Past Surgical History  Procedure Laterality Date  . L-spine  1965  . Total knee arthroplasty  1997    right  . Lumbar l4-5 & s1  02/2000  . Esophagogastroduodenoscopy  06/24/2002    History   Social History  . Marital Status: Married    Spouse Name: N/A    Number of Children: N/A  . Years of Education: N/A   Occupational History  . Retired    Social History Main Topics  . Smoking status: Never Smoker   . Smokeless tobacco: Never Used  . Alcohol Use: No  . Drug Use: No  . Sexual Activity: Not on file   Other Topics Concern  . Not on file   Social History Narrative  . No narrative on file    Current Outpatient Prescriptions on File Prior to Visit  Medication Sig Dispense Refill  . acetaminophen (TYLENOL) 325 MG tablet Take 650 mg by mouth every 6 (six) hours as needed. For pain      . allopurinol (ZYLOPRIM) 100 MG tablet TAKE 1 TABLET BY MOUTH DAILY  30 tablet  0  . atorvastatin (LIPITOR) 20 MG tablet Take 1 tablet (20 mg total) by mouth daily.  30 tablet  11  . benzonatate (TESSALON) 100 MG capsule Take 1 capsule (100 mg total) by mouth  3 (three) times daily as needed for cough.  30 capsule  0  . carvedilol (COREG) 12.5 MG tablet Take 1 tablet (12.5 mg total) by mouth 2 (two) times daily with a meal.  60 tablet  12  . cefUROXime (CEFTIN) 500 MG tablet Take 1 tablet (500 mg total) by mouth 2 (two) times daily with a meal.  20 tablet  0  . Fluticasone-Salmeterol (ADVAIR DISKUS) 100-50 MCG/DOSE AEPB Inhale 1 puff into the lungs 2 (two) times daily.  1 each  1  . furosemide (LASIX) 80 MG tablet Take 80 mg by mouth daily.      [name]. lisinopril (PRINIVIL,ZESTRIL) 40 MG tablet Take 1 tablet (40 mg total) by mouth daily.  30 tablet  3  . metolazone (ZAROXOLYN) 2.5 MG tablet TAKE 1 TABLET BY MOUTH EVERY DAY  30 tablet  0  . potassium chloride 40 MEQ/15ML (20%) LIQD Take 30 mLs (80 mEq total) by mouth 4 (four) times daily.  600 mL  5  . pravastatin (PRAVACHOL) 40 MG tablet Take 40 mg by mouth daily.      . tamsulosin (FLOMAX) 0.4 MG CAPS capsule Take 1 capsule (0.4 mg total) by mouth 2 (two) times  daily.  60 capsule  3  . warfarin (COUMADIN) 5 MG tablet TAKE AS DIRECTED BY COUMADIN CLINIC  30 tablet  3   No current facility-administered medications on file prior to visit.    No Known Allergies  Family History  Problem Relation Age of Onset  . Cancer Neg Hx   . Heart disease Father   . Heart disease Mother   . Diabetes Mother   . Hypertension Brother   . Hypertension Brother    BP 108/72  Pulse 89  Temp(Src) 97.7 F (36.5 C) (Oral)  Ht 5\' 7"  (1.702 m)  Wt 231 lb (104.781 kg)  BMI 36.17 kg/m2  SpO2 98%  Review of Systems Denies chest pain and sob    Objective:   Physical Exam VITAL SIGNS:  See vs page GENERAL: no distress  Lab Results  Component Value Date   CREATININE 0.7 02/12/2014   BUN 15 02/12/2014   NA 134* 02/12/2014   K 3.5 02/12/2014   CL 98 02/12/2014   CO2 27 02/12/2014      Assessment & Plan:  CHF: he needs increased rx Hypokalemia: improved. Pneumonia: clinically much better

## 2014-02-12 NOTE — Patient Instructions (Signed)
blood tests, and a chest-x-ray, are being requested for you today.  We'll contact you with results. Based on the results, i hope to increase your fluid pills.

## 2014-02-13 MED ORDER — FUROSEMIDE 80 MG PO TABS: 80.0000 mg | ORAL_TABLET | Freq: Two times a day (BID) | ORAL | Status: DC

## 2014-02-16 ENCOUNTER — Other Ambulatory Visit: Payer: Self-pay | Admitting: Cardiology

## 2014-03-02 ENCOUNTER — Ambulatory Visit (INDEPENDENT_AMBULATORY_CARE_PROVIDER_SITE_OTHER): Payer: Medicare Other | Admitting: Pharmacist Clinician (PhC)/ Clinical Pharmacy Specialist

## 2014-03-02 DIAGNOSIS — I4891 Unspecified atrial fibrillation: Secondary | ICD-10-CM | POA: Diagnosis not present

## 2014-03-02 DIAGNOSIS — Z7901 Long term (current) use of anticoagulants: Secondary | ICD-10-CM | POA: Diagnosis not present

## 2014-03-02 LAB — POCT INR: INR: 3

## 2014-03-08 ENCOUNTER — Telehealth: Payer: Self-pay | Admitting: Endocrinology

## 2014-03-08 NOTE — Telephone Encounter (Signed)
pts daughter would like to know if his chest x-ray order is still valid  Please call to advise   Call back: [phone]  Thank You :)

## 2014-03-08 NOTE — Telephone Encounter (Signed)
Pt daughter advised that chest x-ray is valid.

## 2014-03-10 ENCOUNTER — Ambulatory Visit: Payer: Medicare Other | Admitting: Endocrinology

## 2014-03-23 ENCOUNTER — Encounter: Payer: Self-pay | Admitting: Endocrinology

## 2014-03-23 ENCOUNTER — Ambulatory Visit (INDEPENDENT_AMBULATORY_CARE_PROVIDER_SITE_OTHER): Payer: Medicare Other | Admitting: Endocrinology

## 2014-03-23 ENCOUNTER — Ambulatory Visit
Admission: RE | Admit: 2014-03-23 | Discharge: 2014-03-23 | Disposition: A | Discharge status: Home / Self Care | Payer: Medicare Other | Source: Ambulatory Visit | Attending: Endocrinology | Admitting: Endocrinology

## 2014-03-23 VITALS — BP 112/70 | HR 88 | Temp 97.7°F | Ht 67.0 in | Wt 234.0 lb

## 2014-03-23 DIAGNOSIS — I509 Heart failure, unspecified: Secondary | ICD-10-CM

## 2014-03-23 DIAGNOSIS — J189 Pneumonia, unspecified organism: Secondary | ICD-10-CM

## 2014-03-23 LAB — BASIC METABOLIC PANEL
BUN: 32 mg/dL — ABNORMAL HIGH (ref 6–23)
CO2: 27 mEq/L (ref 19–32)
Calcium: 9.5 mg/dL (ref 8.4–10.5)
Chloride: 99 mEq/L (ref 96–112)
Creatinine, Ser: 0.8 mg/dL (ref 0.4–1.5)
GFR: 96.7 mL/min (ref >60.00)
GLUCOSE: 121 mg/dL — AB (ref 70–99)
Potassium: 3.1 mEq/L — ABNORMAL LOW (ref 3.5–5.1)
Sodium: 137 mEq/L (ref 135–145)

## 2014-03-23 MED ORDER — POTASSIUM CHLORIDE 40 MEQ/15ML (20%) PO LIQD: 100.0000 meq | Freq: Four times a day (QID) | ORAL | Status: DC

## 2014-03-23 MED ORDER — POTASSIUM CHLORIDE 40 MEQ/15ML (20%) PO LIQD
80.0000 meq | Freq: Four times a day (QID) | ORAL | Status: DC
Start: 2014-03-23 — End: 2014-03-23

## 2014-03-23 NOTE — Progress Notes (Signed)
Subjective:    Patient ID: [name], male    DOB: Mar 26, 1929, 80 y.o.   MRN: [id number]  HPI Hypokalemia: he takes supplement as rx'ed.  He denies cramps.  He says he never misses the medication.   CHF: edema is improved. Pneumonia: cough is almost gone. Past Medical History  Diagnosis Date  . CHF (congestive heart failure)   . HTN (hypertension)   . OA (osteoarthritis)   . BPH (benign prostatic hyperplasia)   . Obesity   . Anemia   . Diabetes mellitus type II   . Atrial fibrillation   . Cardiomyopathy   . Hyperlipidemia   . Peptic ulcer   . Bradycardia   . History of colonoscopy   . ICD (implantable cardiac defibrillator) in place 10/14/2012    biventricular  . GERD (gastroesophageal reflux disease)     Past Surgical History  Procedure Laterality Date  . L-spine  1965  . Total knee arthroplasty  1997    right  . Lumbar l4-5 & s1  02/2000  . Esophagogastroduodenoscopy  06/24/2002    History   Social History  . Marital Status: Married    Spouse Name: N/A    Number of Children: N/A  . Years of Education: N/A   Occupational History  . Retired    Social History Main Topics  . Smoking status: Never Smoker   . Smokeless tobacco: Never Used  . Alcohol Use: No  . Drug Use: No  . Sexual Activity: Not on file   Other Topics Concern  . Not on file   Social History Narrative  . No narrative on file    Current Outpatient Prescriptions on File Prior to Visit  Medication Sig Dispense Refill  . acetaminophen (TYLENOL) 325 MG tablet Take 650 mg by mouth every 6 (six) hours as needed. For pain      . allopurinol (ZYLOPRIM) 100 MG tablet TAKE 1 TABLET BY MOUTH DAILY  30 tablet  0  . atorvastatin (LIPITOR) 20 MG tablet Take 1 tablet (20 mg total) by mouth daily.  30 tablet  11  . carvedilol (COREG) 12.5 MG tablet Take 1 tablet (12.5 mg total) by mouth 2 (two) times daily with a meal.  60 tablet  12  . furosemide (LASIX) 80 MG tablet Take 1 tablet (80 mg  total) by mouth 2 (two) times daily.  60 tablet  11  . lisinopril (PRINIVIL,ZESTRIL) 40 MG tablet Take 1 tablet (40 mg total) by mouth daily.  30 tablet  3  . metolazone (ZAROXOLYN) 2.5 MG tablet TAKE 1 TABLET BY MOUTH EVERY DAY  30 tablet  0  . pravastatin (PRAVACHOL) 40 MG tablet Take 40 mg by mouth daily.      . tamsulosin (FLOMAX) 0.4 MG CAPS capsule Take 1 capsule (0.4 mg total) by mouth 2 (two) times daily.  60 capsule  3  . warfarin (COUMADIN) 5 MG tablet TAKE AS DIRECTED BY COUMADIN CLINIC  30 tablet  3   No current facility-administered medications on file prior to visit.    No Known Allergies  Family History  Problem Relation Age of Onset  . Cancer Neg Hx   . Heart disease Father   . Heart disease Mother   . Diabetes Mother   . Hypertension Brother   . Hypertension Brother     BP 112/70  Pulse 88  Temp(Src) 97.7 F (36.5 C) (Oral)  Ht 5\' 7"  (1.702 m)  Wt 234 lb (106.142 kg)  BMI 36.64 kg/m2  SpO2 95%  Review of Systems He denies sob and weight change.      Objective:   Physical Exam VITAL SIGNS:  See vs page GENERAL: no distress Ext: 1+ bilat leg edema  CXR: NAD Lab Results  Component Value Date   CREATININE 0.8 03/23/2014   BUN 32* 03/23/2014   NA 137 03/23/2014   K 3.1* 03/23/2014   CL 99 03/23/2014   CO2 27 03/23/2014      Assessment & Plan:  CHF: improved Hypokalemia: he needs increased rx.  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 Pneumonia: resolved.

## 2014-03-23 NOTE — Patient Instructions (Addendum)
blood tests are requested for you today.  We'll contact you with results. Please come back for a follow-up appointment in 3 months.

## 2014-03-31 ENCOUNTER — Ambulatory Visit (INDEPENDENT_AMBULATORY_CARE_PROVIDER_SITE_OTHER): Payer: Medicare Other | Admitting: *Deleted

## 2014-03-31 DIAGNOSIS — Z5181 Encounter for therapeutic drug level monitoring: Secondary | ICD-10-CM

## 2014-03-31 DIAGNOSIS — I4891 Unspecified atrial fibrillation: Secondary | ICD-10-CM

## 2014-03-31 DIAGNOSIS — Z7901 Long term (current) use of anticoagulants: Secondary | ICD-10-CM | POA: Diagnosis not present

## 2014-03-31 LAB — POCT INR: INR: 3.2

## 2014-04-02 ENCOUNTER — Other Ambulatory Visit: Payer: Self-pay | Admitting: Endocrinology

## 2014-04-05 ENCOUNTER — Other Ambulatory Visit: Payer: Self-pay | Admitting: Endocrinology

## 2014-04-14 ENCOUNTER — Ambulatory Visit (INDEPENDENT_AMBULATORY_CARE_PROVIDER_SITE_OTHER): Payer: Medicare Other | Admitting: Pharmacist

## 2014-04-14 DIAGNOSIS — Z5181 Encounter for therapeutic drug level monitoring: Secondary | ICD-10-CM | POA: Diagnosis not present

## 2014-04-14 DIAGNOSIS — Z7901 Long term (current) use of anticoagulants: Secondary | ICD-10-CM | POA: Diagnosis not present

## 2014-04-14 DIAGNOSIS — I4891 Unspecified atrial fibrillation: Secondary | ICD-10-CM | POA: Diagnosis not present

## 2014-04-14 LAB — POCT INR: INR: 1.9

## 2014-04-22 ENCOUNTER — Telehealth: Payer: Self-pay | Admitting: *Deleted

## 2014-04-22 DIAGNOSIS — E876 Hypokalemia: Secondary | ICD-10-CM

## 2014-04-22 MED ORDER — POTASSIUM CHLORIDE 20 MEQ PO PACK: 100.0000 meq | PACK | Freq: Four times a day (QID) | ORAL | Status: DC

## 2014-04-22 NOTE — Telephone Encounter (Signed)
Please see below, he wants to switch from liquid potassium to the pills because it's cheaper

## 2014-04-22 NOTE — Telephone Encounter (Signed)
Ok, i have sent a prescription to your pharmacy. Please recheck the blood test 3-5 days after the change.

## 2014-04-23 NOTE — Telephone Encounter (Signed)
P/t's daughter informed

## 2014-04-26 ENCOUNTER — Telehealth: Payer: Self-pay | Admitting: Cardiology

## 2014-04-26 ENCOUNTER — Ambulatory Visit (INDEPENDENT_AMBULATORY_CARE_PROVIDER_SITE_OTHER): Payer: Medicare Other | Admitting: *Deleted

## 2014-04-26 DIAGNOSIS — I428 Other cardiomyopathies: Secondary | ICD-10-CM | POA: Diagnosis not present

## 2014-04-26 DIAGNOSIS — I509 Heart failure, unspecified: Secondary | ICD-10-CM

## 2014-04-26 NOTE — Progress Notes (Signed)
Remote ICD transmission.   

## 2014-04-26 NOTE — Telephone Encounter (Signed)
LMOVM reminding pt to send remote transmission.   

## 2014-04-27 ENCOUNTER — Telehealth: Payer: Self-pay

## 2014-04-27 NOTE — Telephone Encounter (Signed)
Pt's daughter is stating the cheapest form if the pill form.  Please advise, Thanks!

## 2014-04-27 NOTE — Telephone Encounter (Signed)
Pt's daughter called stating she went to pick up pt's potassium and the cost was 800$. Daughter states they were under the impression that the liquid form was going to be changed to the generic pill form. Please advise,  Thanks!

## 2014-04-27 NOTE — Telephone Encounter (Signed)
It is generic.  However, you are on a high dosage.  Please ask your pharmacist what is the cheapest form, and let know.

## 2014-04-28 ENCOUNTER — Other Ambulatory Visit: Payer: Self-pay

## 2014-04-28 NOTE — Telephone Encounter (Signed)
Ok to give him klor-con. This dosage is correct.

## 2014-04-28 NOTE — Telephone Encounter (Signed)
What size tablets would he get? I looked at the medication orders and could not locate anything larger that . Please advise on what the strength and dosage should be. Thanks!

## 2014-04-28 NOTE — Telephone Encounter (Signed)
Called pharmacy. They stated the cheapest alternative would be the Klor Con Tablets. Pharmacist also wanted to check and see if the dosage was correct.  Please advise, Thanks!

## 2014-04-29 LAB — MDC_IDC_ENUM_SESS_TYPE_REMOTE
Battery Voltage: 3.01 V
Brady Statistic AP VP Percent: 0 %
Brady Statistic AP VS Percent: 0 %
Brady Statistic AS VP Percent: 92.79 %
Brady Statistic AS VS Percent: 7.21 %
Brady Statistic RV Percent Paced: 92.79 %
Date Time Interrogation Session: 20150518183821
HighPow Impedance: 171 Ohm
HighPow Impedance: 304 Ohm
HighPow Impedance: 58 Ohm
Lead Channel Impedance Value: 342 Ohm
Lead Channel Impedance Value: 399 Ohm
Lead Channel Impedance Value: 456 Ohm
Lead Channel Impedance Value: 627 Ohm
Lead Channel Pacing Threshold Amplitude: 0.625 V
Lead Channel Pacing Threshold Amplitude: 1.625 V
Lead Channel Sensing Intrinsic Amplitude: 0.25 mV
Lead Channel Sensing Intrinsic Amplitude: 0.25 mV
Lead Channel Setting Pacing Amplitude: 3.25 V
Lead Channel Setting Pacing Pulse Width: 1.5 ms
MDC IDC MSMT LEADCHNL LV IMPEDANCE VALUE: 323 Ohm
MDC IDC MSMT LEADCHNL LV PACING THRESHOLD PULSEWIDTH: 1.5 ms
MDC IDC MSMT LEADCHNL RV PACING THRESHOLD PULSEWIDTH: 0.4 ms
MDC IDC MSMT LEADCHNL RV SENSING INTR AMPL: 5.5 mV
MDC IDC MSMT LEADCHNL RV SENSING INTR AMPL: 5.5 mV
MDC IDC SET LEADCHNL RV PACING AMPLITUDE: 2 V
MDC IDC SET LEADCHNL RV PACING PULSEWIDTH: 0.4 ms
MDC IDC SET LEADCHNL RV SENSING SENSITIVITY: 0.3 mV
MDC IDC SET ZONE DETECTION INTERVAL: 310 ms
MDC IDC SET ZONE DETECTION INTERVAL: 400 ms
MDC IDC STAT BRADY RA PERCENT PACED: 0 %
Zone Setting Detection Interval: 270 ms
Zone Setting Detection Interval: 350 ms

## 2014-04-29 MED ORDER — POTASSIUM CHLORIDE CRYS ER 20 MEQ PO TBCR: 100.0000 meq | EXTENDED_RELEASE_TABLET | Freq: Two times a day (BID) | ORAL | Status: DC

## 2014-04-29 NOTE — Telephone Encounter (Signed)
i have sent a prescription to your pharmacy 

## 2014-04-29 NOTE — Telephone Encounter (Signed)
Pt's daughter informed that script was sent.

## 2014-04-30 ENCOUNTER — Telehealth: Payer: Self-pay | Admitting: *Deleted

## 2014-04-30 ENCOUNTER — Other Ambulatory Visit: Payer: Self-pay

## 2014-04-30 NOTE — Telephone Encounter (Signed)
He needs a high dosage.  i am unaware of any cheaper way

## 2014-04-30 NOTE — Telephone Encounter (Signed)
See below and please advise, Thanks!  

## 2014-04-30 NOTE — Telephone Encounter (Signed)
Issue with medication switch from liquid to pill insurance company will pay for 5 tablets a day [name] All wants him to take 10, can it be changed, not sure of what to do need some help with this please

## 2014-04-30 NOTE — Telephone Encounter (Signed)
Pt's daughter informed. She states the pt will just continue on the liquid potassium. Pt has 1 refill left and will contact office when refill is needed.

## 2014-05-04 ENCOUNTER — Other Ambulatory Visit: Payer: Self-pay | Admitting: Endocrinology

## 2014-05-05 ENCOUNTER — Ambulatory Visit (INDEPENDENT_AMBULATORY_CARE_PROVIDER_SITE_OTHER): Payer: Medicare Other | Admitting: *Deleted

## 2014-05-05 DIAGNOSIS — Z5181 Encounter for therapeutic drug level monitoring: Secondary | ICD-10-CM

## 2014-05-05 DIAGNOSIS — I4891 Unspecified atrial fibrillation: Secondary | ICD-10-CM

## 2014-05-05 DIAGNOSIS — Z7901 Long term (current) use of anticoagulants: Secondary | ICD-10-CM

## 2014-05-05 LAB — POCT INR: INR: 1.8

## 2014-05-06 ENCOUNTER — Other Ambulatory Visit: Payer: Self-pay | Admitting: Cardiology

## 2014-05-06 ENCOUNTER — Other Ambulatory Visit: Payer: Self-pay | Admitting: Endocrinology

## 2014-05-07 ENCOUNTER — Encounter: Payer: Self-pay | Admitting: Cardiology

## 2014-05-12 ENCOUNTER — Telehealth: Payer: Self-pay | Admitting: *Deleted

## 2014-05-12 MED ORDER — POTASSIUM CHLORIDE 40 MEQ/15ML (20%) PO LIQD: 100.0000 meq | Freq: Four times a day (QID) | ORAL | Status: DC

## 2014-05-12 NOTE — Telephone Encounter (Addendum)
Left voice mail informing rx has been sent.

## 2014-05-12 NOTE — Telephone Encounter (Signed)
Out of his liquid potassium needs new RX

## 2014-05-12 NOTE — Telephone Encounter (Signed)
Ok, i have sent a prescription to your pharmacy  

## 2014-05-12 NOTE — Telephone Encounter (Signed)
Per daughters request pt would like to go back on the liquid potassium. Pt gets more for his money this way.  Please advise, Thanks!

## 2014-05-13 ENCOUNTER — Other Ambulatory Visit (INDEPENDENT_AMBULATORY_CARE_PROVIDER_SITE_OTHER): Payer: Medicare Other

## 2014-05-13 ENCOUNTER — Other Ambulatory Visit: Payer: Self-pay

## 2014-05-13 DIAGNOSIS — E876 Hypokalemia: Secondary | ICD-10-CM

## 2014-05-13 LAB — BASIC METABOLIC PANEL
BUN: 18 mg/dL (ref 6–23)
CALCIUM: 9.2 mg/dL (ref 8.4–10.5)
CO2: 28 mEq/L (ref 19–32)
CREATININE: 0.8 mg/dL (ref 0.4–1.5)
Chloride: 105 mEq/L (ref 96–112)
GFR: 102.49 mL/min (ref >60.00)
Glucose, Bld: 105 mg/dL — ABNORMAL HIGH (ref 70–99)
Potassium: 4.9 mEq/L (ref 3.5–5.1)
SODIUM: 137 meq/L (ref 135–145)

## 2014-05-14 ENCOUNTER — Telehealth: Payer: Self-pay

## 2014-05-14 NOTE — Telephone Encounter (Signed)
Pt's daughter called concerning lab results. Wanted to know what the dosage of the potassium should be since the levels were normal. Please advise, Thanks!

## 2014-05-14 NOTE — Telephone Encounter (Signed)
Left voicemail informing pt's daughter.

## 2014-05-14 NOTE — Telephone Encounter (Signed)
Please continue the same 

## 2014-05-18 ENCOUNTER — Encounter: Payer: Self-pay | Admitting: Internal Medicine

## 2014-05-19 ENCOUNTER — Ambulatory Visit (INDEPENDENT_AMBULATORY_CARE_PROVIDER_SITE_OTHER): Payer: Medicare Other | Admitting: *Deleted

## 2014-05-19 DIAGNOSIS — Z7901 Long term (current) use of anticoagulants: Secondary | ICD-10-CM | POA: Diagnosis not present

## 2014-05-19 DIAGNOSIS — I4891 Unspecified atrial fibrillation: Secondary | ICD-10-CM

## 2014-05-19 DIAGNOSIS — Z5181 Encounter for therapeutic drug level monitoring: Secondary | ICD-10-CM

## 2014-05-19 LAB — POCT INR: INR: 2.1

## 2014-06-07 ENCOUNTER — Other Ambulatory Visit: Payer: Self-pay | Admitting: Endocrinology

## 2014-06-07 ENCOUNTER — Other Ambulatory Visit: Payer: Self-pay | Admitting: Cardiology

## 2014-06-09 ENCOUNTER — Ambulatory Visit (INDEPENDENT_AMBULATORY_CARE_PROVIDER_SITE_OTHER): Payer: Medicare Other | Admitting: *Deleted

## 2014-06-09 DIAGNOSIS — Z5181 Encounter for therapeutic drug level monitoring: Secondary | ICD-10-CM | POA: Diagnosis not present

## 2014-06-09 DIAGNOSIS — Z7901 Long term (current) use of anticoagulants: Secondary | ICD-10-CM

## 2014-06-09 DIAGNOSIS — I4891 Unspecified atrial fibrillation: Secondary | ICD-10-CM

## 2014-06-09 LAB — POCT INR: INR: 2.2

## 2014-06-14 ENCOUNTER — Telehealth: Payer: Self-pay | Admitting: Endocrinology

## 2014-06-14 MED ORDER — METOLAZONE 2.5 MG PO TABS: ORAL_TABLET | ORAL | Status: DC

## 2014-06-14 NOTE — Telephone Encounter (Signed)
Zaroxolyn--what is this used for?

## 2014-06-14 NOTE — Telephone Encounter (Signed)
Pt's daughter advised that medication is used for fluid retention. Refill sent per daughters request.

## 2014-06-22 ENCOUNTER — Ambulatory Visit (INDEPENDENT_AMBULATORY_CARE_PROVIDER_SITE_OTHER): Payer: Medicare Other | Admitting: Endocrinology

## 2014-06-22 ENCOUNTER — Encounter: Payer: Self-pay | Admitting: Endocrinology

## 2014-06-22 VITALS — BP 122/84 | HR 89 | Temp 98.0°F | Ht 67.0 in | Wt 253.0 lb

## 2014-06-22 DIAGNOSIS — E119 Type 2 diabetes mellitus without complications: Secondary | ICD-10-CM

## 2014-06-22 DIAGNOSIS — E876 Hypokalemia: Secondary | ICD-10-CM

## 2014-06-22 LAB — BASIC METABOLIC PANEL
BUN: 20 mg/dL (ref 6–23)
CO2: 28 meq/L (ref 19–32)
Calcium: 9.1 mg/dL (ref 8.4–10.5)
Chloride: 100 mEq/L (ref 96–112)
Creatinine, Ser: 0.8 mg/dL (ref 0.4–1.5)
GFR: 100.95 mL/min (ref >60.00)
GLUCOSE: 109 mg/dL — AB (ref 70–99)
Potassium: 3.7 mEq/L (ref 3.5–5.1)
SODIUM: 136 meq/L (ref 135–145)

## 2014-06-22 LAB — HEMOGLOBIN A1C: Hgb A1c MFr Bld: 6.7 % — ABNORMAL HIGH (ref 4.6–6.5)

## 2014-06-22 NOTE — Patient Instructions (Signed)
blood tests are requested for you today.  We'll contact you with results. Please come back for a "medicare wellness" appointment in 3 months.

## 2014-06-22 NOTE — Progress Notes (Signed)
Subjective:    Patient ID: [name], male    DOB: [birth year], 80 y.o.   MRN: [id number]  HPI The state of at least three ongoing medical problems is addressed today, with interval history of each noted here: Hypokalemia: pt says he takes Klor, 5x20 mEq, bid.  He denies cramps.  He says he never misses the medication.   CHF: edema is improved. DM: denies weight change.  Past Medical History  Diagnosis Date  . CHF (congestive heart failure)   . HTN (hypertension)   . OA (osteoarthritis)   . BPH (benign prostatic hyperplasia)   . Obesity   . Anemia   . Diabetes mellitus type II   . Atrial fibrillation   . Cardiomyopathy   . Hyperlipidemia   . Peptic ulcer   . Bradycardia   . History of colonoscopy   . ICD (implantable cardiac defibrillator) in place 10/14/2012    biventricular  . GERD (gastroesophageal reflux disease)     Past Surgical History  Procedure Laterality Date  . L-spine  1965  . Total knee arthroplasty  1997    right  . Lumbar l4-5 & s1  02/2000  . Esophagogastroduodenoscopy  06/24/2002    History   Social History  . Marital Status: Married    Spouse Name: N/A    Number of Children: N/A  . Years of Education: N/A   Occupational History  . Retired    Social History Main Topics  . Smoking status: Never Smoker   . Smokeless tobacco: Never Used  . Alcohol Use: No  . Drug Use: No  . Sexual Activity: Not on file   Other Topics Concern  . Not on file   Social History Narrative  . No narrative on file    Current Outpatient Prescriptions on File Prior to Visit  Medication Sig Dispense Refill  . acetaminophen (TYLENOL) 325 MG tablet Take 650 mg by mouth every 6 (six) hours as needed. For pain      . allopurinol (ZYLOPRIM) 100 MG tablet TAKE 1 TABLET BY MOUTH EVERY DAY  30 tablet  0  . atorvastatin (LIPITOR) 20 MG tablet Take 1 tablet (20 mg total) by mouth daily.  30 tablet  11  . carvedilol (COREG) 12.5 MG tablet TAKE 1 TABLET BY MOUTH  TWICE DAILY WITH MEALS  60 tablet  0  . furosemide (LASIX) 80 MG tablet Take 1 tablet (80 mg total) by mouth 2 (two) times daily.  60 tablet  11  . lisinopril (PRINIVIL,ZESTRIL) 40 MG tablet TAKE 1 TABLET BY MOUTH EVERYDAY  30 tablet  0  . pravastatin (PRAVACHOL) 40 MG tablet Take 40 mg by mouth daily.      . tamsulosin (FLOMAX) 0.4 MG CAPS capsule TAKE 1 CAPSULE BY MOUTH TWICE DAILY  60 capsule  0  . warfarin (COUMADIN) 5 MG tablet 1 pill everyday except 1/2 pill on Thursdays or as directed by coumadin clinic  35 tablet  3  . metolazone (ZAROXOLYN) 2.5 MG tablet TAKE 1 TABLET BY MOUTH DAILY  30 tablet  1   No current facility-administered medications on file prior to visit.    No Known Allergies  Family History  Problem Relation Age of Onset  . Cancer Neg Hx   . Heart disease Father   . Heart disease Mother   . Diabetes Mother   . Hypertension Brother   . Hypertension Brother     BP 122/84  Pulse 89  Temp(Src) 98 F (  36.7 C) (Oral)  Ht 5\' 7"  (1.702 m)  Wt 253 lb (114.76 kg)  BMI 39.62 kg/m2  SpO2 98%  Review of Systems Denies sob and n/v.      Objective:   Physical Exam VITAL SIGNS:  See vs page GENERAL: no distress LUNGS:  Clear to auscultation Ext: 1+ bilat leg edema.  Lab Results  Component Value Date   CREATININE 0.8 06/22/2014   BUN 20 06/22/2014   NA 136 06/22/2014   K 3.7 06/22/2014   CL 100 06/22/2014   CO2 28 06/22/2014   Lab Results  Component Value Date   HGBA1C 6.7* 06/22/2014      Assessment & Plan:  Hypokalemia: well-replaced CHF: well-controlled DM: no medication is needed now.   Patient is advised the following: Patient Instructions  blood tests are requested for you today.  We'll contact you with results. Please come back for a "medicare wellness" appointment in 3 months.

## 2014-06-25 ENCOUNTER — Telehealth: Payer: Self-pay | Admitting: Endocrinology

## 2014-06-25 NOTE — Telephone Encounter (Signed)
Let voicemail informing that labs were normal and to continue same medication.

## 2014-06-25 NOTE — Telephone Encounter (Signed)
Daughter requesting lab results please

## 2014-07-07 ENCOUNTER — Ambulatory Visit (INDEPENDENT_AMBULATORY_CARE_PROVIDER_SITE_OTHER): Payer: Medicare Other | Admitting: *Deleted

## 2014-07-07 DIAGNOSIS — Z7901 Long term (current) use of anticoagulants: Secondary | ICD-10-CM

## 2014-07-07 DIAGNOSIS — I4891 Unspecified atrial fibrillation: Secondary | ICD-10-CM

## 2014-07-07 DIAGNOSIS — Z5181 Encounter for therapeutic drug level monitoring: Secondary | ICD-10-CM

## 2014-07-07 LAB — POCT INR: INR: 2.5

## 2014-07-08 ENCOUNTER — Other Ambulatory Visit: Payer: Self-pay | Admitting: Endocrinology

## 2014-07-09 ENCOUNTER — Other Ambulatory Visit: Payer: Self-pay

## 2014-07-09 MED ORDER — CARVEDILOL 12.5 MG PO TABS: ORAL_TABLET | ORAL | Status: DC

## 2014-07-27 ENCOUNTER — Other Ambulatory Visit: Payer: Self-pay | Admitting: Cardiology

## 2014-07-27 NOTE — Telephone Encounter (Signed)
Rx was sent to pharmacy electronically. 

## 2014-07-28 ENCOUNTER — Ambulatory Visit (INDEPENDENT_AMBULATORY_CARE_PROVIDER_SITE_OTHER): Payer: Medicare Other | Admitting: *Deleted

## 2014-07-28 ENCOUNTER — Telehealth: Payer: Self-pay | Admitting: Cardiology

## 2014-07-28 DIAGNOSIS — I428 Other cardiomyopathies: Secondary | ICD-10-CM | POA: Diagnosis not present

## 2014-07-28 DIAGNOSIS — I429 Cardiomyopathy, unspecified: Secondary | ICD-10-CM

## 2014-07-28 LAB — MDC_IDC_ENUM_SESS_TYPE_REMOTE
Brady Statistic AP VP Percent: 0 %
Brady Statistic AS VP Percent: 95.1 %
Brady Statistic AS VS Percent: 4.9 %
Brady Statistic RA Percent Paced: 0 %
Brady Statistic RV Percent Paced: 95.1 %
HighPow Impedance: 171 Ohm
HighPow Impedance: 304 Ohm
HighPow Impedance: 66 Ohm
Lead Channel Impedance Value: 342 Ohm
Lead Channel Impedance Value: 380 Ohm
Lead Channel Impedance Value: 456 Ohm
Lead Channel Pacing Threshold Pulse Width: 1.5 ms
Lead Channel Setting Pacing Amplitude: 2 V
Lead Channel Setting Pacing Amplitude: 3 V
Lead Channel Setting Pacing Pulse Width: 1.5 ms
MDC IDC MSMT BATTERY VOLTAGE: 3.01 V
MDC IDC MSMT LEADCHNL LV IMPEDANCE VALUE: 627 Ohm
MDC IDC MSMT LEADCHNL LV PACING THRESHOLD AMPLITUDE: 1.5 V
MDC IDC MSMT LEADCHNL RA SENSING INTR AMPL: 0.25 mV
MDC IDC MSMT LEADCHNL RV IMPEDANCE VALUE: 342 Ohm
MDC IDC MSMT LEADCHNL RV PACING THRESHOLD AMPLITUDE: 0.75 V
MDC IDC MSMT LEADCHNL RV PACING THRESHOLD PULSEWIDTH: 0.4 ms
MDC IDC MSMT LEADCHNL RV SENSING INTR AMPL: 10.125 mV
MDC IDC SESS DTM: 20150819195741
MDC IDC SET LEADCHNL RV PACING PULSEWIDTH: 0.4 ms
MDC IDC SET LEADCHNL RV SENSING SENSITIVITY: 0.3 mV
MDC IDC SET ZONE DETECTION INTERVAL: 310 ms
MDC IDC STAT BRADY AP VS PERCENT: 0 %
Zone Setting Detection Interval: 270 ms
Zone Setting Detection Interval: 350 ms
Zone Setting Detection Interval: 400 ms

## 2014-07-28 NOTE — Telephone Encounter (Signed)
LMOVM reminding pt to send remote transmission.   

## 2014-07-29 NOTE — Progress Notes (Signed)
Remote ICD transmission.   

## 2014-08-04 ENCOUNTER — Ambulatory Visit (INDEPENDENT_AMBULATORY_CARE_PROVIDER_SITE_OTHER): Payer: Medicare Other | Admitting: *Deleted

## 2014-08-04 DIAGNOSIS — Z5181 Encounter for therapeutic drug level monitoring: Secondary | ICD-10-CM

## 2014-08-04 DIAGNOSIS — Z7901 Long term (current) use of anticoagulants: Secondary | ICD-10-CM | POA: Diagnosis not present

## 2014-08-04 DIAGNOSIS — I4891 Unspecified atrial fibrillation: Secondary | ICD-10-CM | POA: Diagnosis not present

## 2014-08-04 LAB — POCT INR: INR: 2.4

## 2014-08-08 ENCOUNTER — Other Ambulatory Visit: Payer: Self-pay | Admitting: Endocrinology

## 2014-08-10 ENCOUNTER — Encounter: Payer: Self-pay | Admitting: Cardiology

## 2014-08-12 ENCOUNTER — Encounter: Payer: Self-pay | Admitting: Cardiology

## 2014-08-16 ENCOUNTER — Encounter: Payer: Self-pay | Admitting: Internal Medicine

## 2014-09-08 ENCOUNTER — Other Ambulatory Visit: Payer: Self-pay | Admitting: Endocrinology

## 2014-09-08 ENCOUNTER — Ambulatory Visit (INDEPENDENT_AMBULATORY_CARE_PROVIDER_SITE_OTHER): Payer: Medicare Other | Admitting: *Deleted

## 2014-09-08 DIAGNOSIS — Z5181 Encounter for therapeutic drug level monitoring: Secondary | ICD-10-CM

## 2014-09-08 DIAGNOSIS — Z7901 Long term (current) use of anticoagulants: Secondary | ICD-10-CM | POA: Diagnosis not present

## 2014-09-08 DIAGNOSIS — I4891 Unspecified atrial fibrillation: Secondary | ICD-10-CM

## 2014-09-08 LAB — POCT INR: INR: 2.8

## 2014-09-22 ENCOUNTER — Encounter: Payer: Self-pay | Admitting: Endocrinology

## 2014-09-22 ENCOUNTER — Ambulatory Visit (INDEPENDENT_AMBULATORY_CARE_PROVIDER_SITE_OTHER): Payer: Medicare Other | Admitting: Endocrinology

## 2014-09-22 VITALS — BP 122/78 | HR 94 | Temp 97.6°F | Ht 67.0 in | Wt 259.0 lb

## 2014-09-22 DIAGNOSIS — E785 Hyperlipidemia, unspecified: Secondary | ICD-10-CM

## 2014-09-22 DIAGNOSIS — Z0000 Encounter for general adult medical examination without abnormal findings: Secondary | ICD-10-CM | POA: Diagnosis not present

## 2014-09-22 DIAGNOSIS — Z23 Encounter for immunization: Secondary | ICD-10-CM | POA: Diagnosis not present

## 2014-09-22 DIAGNOSIS — E119 Type 2 diabetes mellitus without complications: Secondary | ICD-10-CM | POA: Diagnosis not present

## 2014-09-22 DIAGNOSIS — D696 Thrombocytopenia, unspecified: Secondary | ICD-10-CM | POA: Diagnosis not present

## 2014-09-22 DIAGNOSIS — Z125 Encounter for screening for malignant neoplasm of prostate: Secondary | ICD-10-CM | POA: Diagnosis not present

## 2014-09-22 DIAGNOSIS — E790 Hyperuricemia without signs of inflammatory arthritis and tophaceous disease: Secondary | ICD-10-CM | POA: Diagnosis not present

## 2014-09-22 DIAGNOSIS — E1159 Type 2 diabetes mellitus with other circulatory complications: Secondary | ICD-10-CM

## 2014-09-22 LAB — BASIC METABOLIC PANEL
BUN: 24 mg/dL — AB (ref 6–23)
CALCIUM: 9.2 mg/dL (ref 8.4–10.5)
CO2: 31 mEq/L (ref 19–32)
Chloride: 99 mEq/L (ref 96–112)
Creatinine, Ser: 0.9 mg/dL (ref 0.4–1.5)
GFR: 88.94 mL/min (ref >60.00)
GLUCOSE: 106 mg/dL — AB (ref 70–99)
Potassium: 4.2 mEq/L (ref 3.5–5.1)
SODIUM: 135 meq/L (ref 135–145)

## 2014-09-22 LAB — LIPID PANEL
Cholesterol: 169 mg/dL (ref 0–200)
HDL: 55.6 mg/dL (ref >39.00)
LDL Cholesterol: 97 mg/dL (ref 0–99)
NonHDL: 113.4
Total CHOL/HDL Ratio: 3
Triglycerides: 81 mg/dL (ref 0.0–149.0)
VLDL: 16.2 mg/dL (ref 0.0–40.0)

## 2014-09-22 LAB — IBC PANEL
Iron: 81 ug/dL (ref 42–165)
Saturation Ratios: 26.3 % (ref 20.0–50.0)
Transferrin: 220.2 mg/dL (ref 212.0–360.0)

## 2014-09-22 LAB — HEPATIC FUNCTION PANEL
ALT: 20 U/L (ref 0–53)
AST: 24 U/L (ref 0–37)
Albumin: 3.6 g/dL (ref 3.5–5.2)
Alkaline Phosphatase: 65 U/L (ref 39–117)
Bilirubin, Direct: 0.1 mg/dL (ref 0.0–0.3)
Total Bilirubin: 1 mg/dL (ref 0.2–1.2)
Total Protein: 7.7 g/dL (ref 6.0–8.3)

## 2014-09-22 LAB — CBC WITH DIFFERENTIAL/PLATELET
BASOS PCT: 0.5 % (ref 0.0–3.0)
Basophils Absolute: 0 10*3/uL (ref 0.0–0.1)
EOS PCT: 4.5 % (ref 0.0–5.0)
Eosinophils Absolute: 0.3 10*3/uL (ref 0.0–0.7)
HCT: 41.2 % (ref 39.0–52.0)
Hemoglobin: 13.2 g/dL (ref 13.0–17.0)
LYMPHS PCT: 23.9 % (ref 12.0–46.0)
Lymphs Abs: 1.4 10*3/uL (ref 0.7–4.0)
MCHC: 32 g/dL (ref 30.0–36.0)
MCV: 94.8 fl (ref 78.0–100.0)
MONO ABS: 0.6 10*3/uL (ref 0.1–1.0)
Monocytes Relative: 11.1 % (ref 3.0–12.0)
Neutro Abs: 3.4 10*3/uL (ref 1.4–7.7)
Neutrophils Relative %: 60 % (ref 43.0–77.0)
Platelets: 136 10*3/uL — ABNORMAL LOW (ref 150.0–400.0)
RBC: 4.35 Mil/uL (ref 4.22–5.81)
RDW: 15 % (ref 11.5–15.5)
WBC: 5.7 10*3/uL (ref 4.0–10.5)

## 2014-09-22 LAB — URIC ACID: Uric Acid, Serum: 6.3 mg/dL (ref 4.0–7.8)

## 2014-09-22 LAB — PSA, MEDICARE: PSA: 1.48 ng/ml (ref 0.10–4.00)

## 2014-09-22 LAB — HEMOGLOBIN A1C: Hgb A1c MFr Bld: 7 % — ABNORMAL HIGH (ref 4.6–6.5)

## 2014-09-22 LAB — TSH: TSH: 0.53 u[IU]/mL (ref 0.35–4.50)

## 2014-09-22 NOTE — Patient Instructions (Addendum)
blood tests are requested for you today.  We'll contact you with results. Please come back for a follow-up appointment in 3 months. please consider these measures for your health:  minimize alcohol.  do not use tobacco products.  have a colonoscopy at least every 10 years from age 80.   keep firearms safely stored.  always use seat belts.  have working smoke alarms in your home.  see an eye doctor and dentist regularly.  never drive under the influence of alcohol or drugs (including prescription drugs).  those with fair skin should take precautions against the sun. it is critically important to prevent falling down (keep floor areas well-lit, dry, and free of loose objects.  If you have a cane, walker, or wheelchair, you should use it, even for short trips around the house.  Also, try not to rush). good diet and exercise habits significanly improve the control of your diabetes.  please let me know if you wish to be referred to a dietician.  high blood sugar is very risky to your health.  you should see an eye doctor and dentist every year.  It is very important to get all recommended vaccinations.  Please let me know if the hearing loss persists, so i can request for you to see a hearing specialist.

## 2014-09-22 NOTE — Progress Notes (Signed)
Subjective:    Patient ID: [name], male    DOB: 04/09/1929, 80 y.o.   MRN: [id number]  HPI The state of at least three ongoing medical problems is addressed today, with interval history of each noted here: Hypokalemia: pt says he takes Klor, 5x20 mEq, BID.  He denies cramps.  He says he never misses the medication.   CHF: edema persists. DM: he has gained weight.   Past Medical History  Diagnosis Date  . CHF (congestive heart failure)   . HTN (hypertension)   . OA (osteoarthritis)   . BPH (benign prostatic hyperplasia)   . Obesity   . Anemia   . Diabetes mellitus type II   . Atrial fibrillation   . Cardiomyopathy   . Hyperlipidemia   . Peptic ulcer   . Bradycardia   . History of colonoscopy   . ICD (implantable cardiac defibrillator) in place 10/14/2012    biventricular  . GERD (gastroesophageal reflux disease)     Past Surgical History  Procedure Laterality Date  . L-spine  1965  . Total knee arthroplasty  1997    right  . Lumbar l4-5 & s1  02/2000  . Esophagogastroduodenoscopy  06/24/2002    History   Social History  . Marital Status: Married    Spouse Name: N/A    Number of Children: N/A  . Years of Education: N/A   Occupational History  . Retired    Social History Main Topics  . Smoking status: Never Smoker   . Smokeless tobacco: Never Used  . Alcohol Use: No  . Drug Use: No  . Sexual Activity: Not on file   Other Topics Concern  . Not on file   Social History Narrative  . No narrative on file    Current Outpatient Prescriptions on File Prior to Visit  Medication Sig Dispense Refill  . acetaminophen (TYLENOL) 325 MG tablet Take 650 mg by mouth every 6 (six) hours as needed. For pain      . allopurinol (ZYLOPRIM) 100 MG tablet TAKE 1 TABLET BY MOUTH EVERY DAY  30 tablet  0  . atorvastatin (LIPITOR) 20 MG tablet TAKE 1 TABLET BY MOUTH DAILY  30 tablet  0  . carvedilol (COREG) 12.5 MG tablet Take 1 tablet (12.5 mg total) by mouth 2  (two) times daily with a meal.  30 tablet  7  . furosemide (LASIX) 80 MG tablet Take 1 tablet (80 mg total) by mouth 2 (two) times daily.  60 tablet  11  . lisinopril (PRINIVIL,ZESTRIL) 40 MG tablet TAKE 1 TABLET BY MOUTH EVERY DAY  30 tablet  2  . metolazone (ZAROXOLYN) 2.5 MG tablet TAKE 1 TABLET BY MOUTH DAILY  30 tablet  0  . potassium chloride SA (K-DUR,KLOR-CON) 20 MEQ tablet 5 tablets, twice daily      . pravastatin (PRAVACHOL) 40 MG tablet Take 40 mg by mouth daily.      . tamsulosin (FLOMAX) 0.4 MG CAPS capsule TAKE ONE CAPSULE BY MOUTH TWICE DAILY  60 capsule  2  . warfarin (COUMADIN) 5 MG tablet 1 pill everyday except 1/2 pill on Thursdays or as directed by coumadin clinic  35 tablet  3   No current facility-administered medications on file prior to visit.    No Known Allergies  Family History  Problem Relation Age of Onset  . Cancer Neg Hx   . Heart disease Father   . Heart disease Mother   . Diabetes Mother   .  Hypertension Brother   . Hypertension Brother     BP 122/78  Pulse 94  Temp(Src) 97.6 F (36.4 C) (Oral)  Ht 5\' 7"  (1.702 m)  Wt 259 lb (117.482 kg)  BMI 40.56 kg/m2  SpO2 96%   Review of Systems Denies sob and gout sxs    Objective:   Physical Exam VITAL SIGNS:  See vs page GENERAL: no distress LUNGS:  Clear to auscultation Pulses: dorsalis pedis intact bilat.  Feet: no deformity. no ulcer on the feet. feet are of normal color and temp. 2+ bilat leg edema. bilat varicosities. bilat rust discoloration of the legs. Heavy callus on the plantar aspect of the right foot, under the MTP joint.  There is bilateral onychomycosis.   Neuro: sensation is intact to touch on the feet.       Assessment & Plan:  Hypokalemia: med compliance is good. DM: prob well-controlled. CHF: clinically well-controlled.    Subjective:   Patient here for Medicare annual wellness visit and management of other chronic and acute problems.     Risk factors: advanced age      Roster of Physicians Providing Medical Care to Patient:  See "snapshot"   Activities of Daily Living: In your present state of health, do you have any difficulty performing the following activities?:  Preparing food and eating?: No  Bathing yourself: No  Getting dressed: No  Using the toilet:No  Moving around from place to place: No  In the past year have you fallen or had a near fall?:No    Home Safety: Has smoke detector and wears seat belts. Firearms are safely stored. No excess sun exposure.  Diet and Exercise  Current exercise habits: limited by health problems Dietary issues discussed: pt reports a healthy diet   Depression Screen  Q1: Over the past two weeks, have you felt down, depressed or hopeless?no  Q2: Over the past two weeks, have you felt little interest or pleasure in doing things? no   The following portions of the patient's history were reviewed and updated as appropriate: allergies, current medications, past family history, past medical history, past social history, past surgical history and problem list.  Past Medical History  Diagnosis Date  . CHF (congestive heart failure)   . HTN (hypertension)   . OA (osteoarthritis)   . BPH (benign prostatic hyperplasia)   . Obesity   . Anemia   . Diabetes mellitus type II   . Atrial fibrillation   . Cardiomyopathy   . Hyperlipidemia   . Peptic ulcer   . Bradycardia   . History of colonoscopy   . ICD (implantable cardiac defibrillator) in place 10/14/2012    biventricular  . GERD (gastroesophageal reflux disease)     Past Surgical History  Procedure Laterality Date  . L-spine  1965  . Total knee arthroplasty  1997    right  . Lumbar l4-5 & s1  02/2000  . Esophagogastroduodenoscopy  06/24/2002    History   Social History  . Marital Status: Married    Spouse Name: N/A    Number of Children: N/A  . Years of Education: N/A   Occupational History  . Retired    Social History Main Topics  . Smoking  status: Never Smoker   . Smokeless tobacco: Never Used  . Alcohol Use: No  . Drug Use: No  . Sexual Activity: Not on file   Other Topics Concern  . Not on file   Social History Narrative  . No  narrative on file    Current Outpatient Prescriptions on File Prior to Visit  Medication Sig Dispense Refill  . acetaminophen (TYLENOL) 325 MG tablet Take 650 mg by mouth every 6 (six) hours as needed. For pain      . allopurinol (ZYLOPRIM) 100 MG tablet TAKE 1 TABLET BY MOUTH EVERY DAY  30 tablet  0  . atorvastatin (LIPITOR) 20 MG tablet TAKE 1 TABLET BY MOUTH DAILY  30 tablet  0  . carvedilol (COREG) 12.5 MG tablet Take 1 tablet (12.5 mg total) by mouth 2 (two) times daily with a meal.  30 tablet  7  . furosemide (LASIX) 80 MG tablet Take 1 tablet (80 mg total) by mouth 2 (two) times daily.  60 tablet  11  . lisinopril (PRINIVIL,ZESTRIL) 40 MG tablet TAKE 1 TABLET BY MOUTH EVERY DAY  30 tablet  2  . metolazone (ZAROXOLYN) 2.5 MG tablet TAKE 1 TABLET BY MOUTH DAILY  30 tablet  0  . potassium chloride SA (K-DUR,KLOR-CON) 20 MEQ tablet 5 tablets, twice daily      . pravastatin (PRAVACHOL) 40 MG tablet Take 40 mg by mouth daily.      . tamsulosin (FLOMAX) 0.4 MG CAPS capsule TAKE ONE CAPSULE BY MOUTH TWICE DAILY  60 capsule  2  . warfarin (COUMADIN) 5 MG tablet 1 pill everyday except 1/2 pill on Thursdays or as directed by coumadin clinic  35 tablet  3   No current facility-administered medications on file prior to visit.    No Known Allergies  Family History  Problem Relation Age of Onset  . Cancer Neg Hx   . Heart disease Father   . Heart disease Mother   . Diabetes Mother   . Hypertension Brother   . Hypertension Brother     BP 122/78  Pulse 94  Temp(Src) 97.6 F (36.4 C) (Oral)  Ht 5\' 7"  (1.702 m)  Wt 259 lb (117.482 kg)  BMI 40.56 kg/m2  SpO2 96%   Review of Systems  Denies visual loss.  No change in chronic hearing loss. Objective:   Vision:  Sees  opthalmologist Hearing: grossly normal Body mass index:  See vs page Msk: pt slowly performs "get-up-and-go" from a sitting position Cognitive Impairment Assessment: cognition, memory and judgment appear normal.  remembers 2/3 at 5 minutes.  excellent recall.  can easily read and write a sentence.  alert and oriented x 3.    Assessment:   Medicare wellness utd on preventive parameters    Plan:   During the course of the visit the patient was educated and counseled about appropriate screening and preventive services including:        Fall prevention    Diabetes screening  Nutrition counseling   Vaccines / LABS Zostavax / Pneumococcal Vaccine  today  PSA  Patient Instructions (the written plan) was given to the patient.   He declines rx for memory loss  we discussed code status.  pt requests full code, but would not want to be started or maintained on artificial life-support measures if there was not a reasonable chance of recovery.

## 2014-09-23 LAB — URINALYSIS, ROUTINE W REFLEX MICROSCOPIC
Bilirubin Urine: NEGATIVE
Hgb urine dipstick: NEGATIVE
Ketones, ur: NEGATIVE
Leukocytes, UA: NEGATIVE
Nitrite: NEGATIVE
RBC / HPF: NONE SEEN (ref 0–?)
SPECIFIC GRAVITY, URINE: 1.015 (ref 1.000–1.030)
TOTAL PROTEIN, URINE-UPE24: NEGATIVE
URINE GLUCOSE: NEGATIVE
Urobilinogen, UA: 0.2 (ref 0.0–1.0)
WBC UA: NONE SEEN (ref 0–?)
pH: 7.5 (ref 5.0–8.0)

## 2014-09-24 LAB — MICROALBUMIN / CREATININE URINE RATIO
Creatinine,U: 71.9 mg/dL
MICROALB UR: 1.7 mg/dL (ref 0.0–1.9)
Microalb Creat Ratio: 2.4 mg/g (ref 0.0–30.0)

## 2014-10-07 ENCOUNTER — Other Ambulatory Visit: Payer: Self-pay | Admitting: Endocrinology

## 2014-10-20 ENCOUNTER — Ambulatory Visit (INDEPENDENT_AMBULATORY_CARE_PROVIDER_SITE_OTHER): Payer: Medicare Other | Admitting: *Deleted

## 2014-10-20 DIAGNOSIS — I4891 Unspecified atrial fibrillation: Secondary | ICD-10-CM | POA: Diagnosis not present

## 2014-10-20 DIAGNOSIS — Z7901 Long term (current) use of anticoagulants: Secondary | ICD-10-CM

## 2014-10-20 DIAGNOSIS — Z5181 Encounter for therapeutic drug level monitoring: Secondary | ICD-10-CM

## 2014-10-20 LAB — POCT INR: INR: 2.7

## 2014-10-21 ENCOUNTER — Ambulatory Visit (INDEPENDENT_AMBULATORY_CARE_PROVIDER_SITE_OTHER): Payer: Medicare Other | Admitting: Podiatry

## 2014-10-21 VITALS — BP 120/78 | HR 94 | Resp 16 | Ht 71.0 in | Wt 250.0 lb

## 2014-10-21 DIAGNOSIS — B351 Tinea unguium: Secondary | ICD-10-CM

## 2014-10-21 DIAGNOSIS — M79676 Pain in unspecified toe(s): Secondary | ICD-10-CM | POA: Diagnosis not present

## 2014-10-21 NOTE — Progress Notes (Signed)
   Subjective:    Patient ID: [name], male    DOB: 30-Jan-1929, 80 y.o.   MRN: [id number]  HPI Comments: Painful toenails , thick discolored , toenails, cant reach and they hurt.      Review of Systems  All other systems reviewed and are negative.      Objective:   Physical Exam: I have reviewed his past history medications allergies surgery social history and review of systems. Pulses are strongly palpable bilateral. Neurologic sensorium is intact versus once the monofilament. Deep tendon reflexes are intact bilateral and muscle strength is 5 over 5 dorsiflexion plantar flexors and inverters everters onto the musculature is intact. Orthopedic evaluation demonstrates ECCHYMOSIS ankle for range of motion without crepitus on hammertoe deformity hallux valgus deformities are noted bilaterally but are asymptomatic. Cutaneous evaluation demonstrates thick yellow dystrophic onychomycotic nails 1 through 5 bilateral.        Assessment & Plan:  Assessment: Pain in limb secondary to onychomycosis 1 through 5 bilateral area  Plan: Discussed etiology pathology conservative versus surgical therapies. Debrided nails 1 through 5 bilateral covered service secondary to pain. Follow up with him in 3 months.

## 2014-11-01 ENCOUNTER — Ambulatory Visit (INDEPENDENT_AMBULATORY_CARE_PROVIDER_SITE_OTHER): Payer: Medicare Other | Admitting: *Deleted

## 2014-11-01 ENCOUNTER — Telehealth: Payer: Self-pay | Admitting: Cardiology

## 2014-11-01 DIAGNOSIS — I429 Cardiomyopathy, unspecified: Secondary | ICD-10-CM | POA: Diagnosis not present

## 2014-11-01 LAB — MDC_IDC_ENUM_SESS_TYPE_REMOTE
Brady Statistic AP VS Percent: 0 %
Brady Statistic AS VS Percent: 5.69 %
Brady Statistic RV Percent Paced: 94.31 %
HIGH POWER IMPEDANCE MEASURED VALUE: 323 Ohm
HighPow Impedance: 78 Ohm
Lead Channel Impedance Value: 342 Ohm
Lead Channel Impedance Value: 380 Ohm
Lead Channel Impedance Value: 399 Ohm
Lead Channel Impedance Value: 456 Ohm
Lead Channel Impedance Value: 627 Ohm
Lead Channel Pacing Threshold Pulse Width: 0.4 ms
Lead Channel Pacing Threshold Pulse Width: 1.5 ms
Lead Channel Sensing Intrinsic Amplitude: 0.375 mV
Lead Channel Sensing Intrinsic Amplitude: 0.375 mV
Lead Channel Sensing Intrinsic Amplitude: 5.125 mV
Lead Channel Setting Pacing Amplitude: 2 V
Lead Channel Setting Sensing Sensitivity: 0.3 mV
MDC IDC MSMT BATTERY VOLTAGE: 2.98 V
MDC IDC MSMT LEADCHNL LV PACING THRESHOLD AMPLITUDE: 1.5 V
MDC IDC MSMT LEADCHNL RV PACING THRESHOLD AMPLITUDE: 0.75 V
MDC IDC MSMT LEADCHNL RV SENSING INTR AMPL: 5.125 mV
MDC IDC SESS DTM: 20151123222546
MDC IDC SET LEADCHNL LV PACING AMPLITUDE: 3 V
MDC IDC SET LEADCHNL LV PACING PULSEWIDTH: 1.5 ms
MDC IDC SET LEADCHNL RV PACING PULSEWIDTH: 0.4 ms
MDC IDC SET ZONE DETECTION INTERVAL: 310 ms
MDC IDC STAT BRADY AP VP PERCENT: 0 %
MDC IDC STAT BRADY AS VP PERCENT: 94.31 %
MDC IDC STAT BRADY RA PERCENT PACED: 0 %
Zone Setting Detection Interval: 270 ms
Zone Setting Detection Interval: 350 ms
Zone Setting Detection Interval: 400 ms

## 2014-11-01 NOTE — Telephone Encounter (Signed)
LMOVM reminding pt to send remote transmission.   

## 2014-11-01 NOTE — Progress Notes (Signed)
Remote ICD transmission.   

## 2014-11-06 ENCOUNTER — Other Ambulatory Visit: Payer: Self-pay | Admitting: Cardiology

## 2014-11-06 ENCOUNTER — Other Ambulatory Visit: Payer: Self-pay | Admitting: Endocrinology

## 2014-11-24 ENCOUNTER — Other Ambulatory Visit: Payer: Self-pay | Admitting: Cardiology

## 2014-11-24 ENCOUNTER — Encounter: Payer: Self-pay | Admitting: Cardiology

## 2014-11-24 ENCOUNTER — Other Ambulatory Visit: Payer: Self-pay | Admitting: *Deleted

## 2014-11-24 MED ORDER — CARVEDILOL 12.5 MG PO TABS: 12.5000 mg | ORAL_TABLET | Freq: Two times a day (BID) | ORAL | Status: DC

## 2014-11-24 NOTE — Telephone Encounter (Signed)
[name] is needing a new prescription for his Carvediol 12.5 he takes 2 a day  sent to [facility] on [address] . Please call if you have any questions    Thanks

## 2014-12-01 ENCOUNTER — Ambulatory Visit (INDEPENDENT_AMBULATORY_CARE_PROVIDER_SITE_OTHER): Payer: Medicare Other | Admitting: *Deleted

## 2014-12-01 DIAGNOSIS — Z7901 Long term (current) use of anticoagulants: Secondary | ICD-10-CM | POA: Diagnosis not present

## 2014-12-01 DIAGNOSIS — Z5181 Encounter for therapeutic drug level monitoring: Secondary | ICD-10-CM | POA: Diagnosis not present

## 2014-12-01 DIAGNOSIS — I4891 Unspecified atrial fibrillation: Secondary | ICD-10-CM | POA: Diagnosis not present

## 2014-12-01 LAB — POCT INR: INR: 3.2

## 2014-12-06 ENCOUNTER — Encounter: Payer: Self-pay | Admitting: Internal Medicine

## 2014-12-07 ENCOUNTER — Other Ambulatory Visit: Payer: Self-pay | Admitting: Endocrinology

## 2014-12-08 ENCOUNTER — Encounter: Payer: Self-pay | Admitting: Cardiology

## 2014-12-23 ENCOUNTER — Encounter: Payer: Self-pay | Admitting: Endocrinology

## 2014-12-23 ENCOUNTER — Ambulatory Visit (INDEPENDENT_AMBULATORY_CARE_PROVIDER_SITE_OTHER): Payer: Medicare Other | Admitting: Endocrinology

## 2014-12-23 VITALS — BP 130/88 | HR 90 | Temp 97.9°F | Ht 67.0 in | Wt 258.0 lb

## 2014-12-23 DIAGNOSIS — E876 Hypokalemia: Secondary | ICD-10-CM

## 2014-12-23 DIAGNOSIS — E1159 Type 2 diabetes mellitus with other circulatory complications: Secondary | ICD-10-CM | POA: Diagnosis not present

## 2014-12-23 DIAGNOSIS — M1712 Unilateral primary osteoarthritis, left knee: Secondary | ICD-10-CM | POA: Diagnosis not present

## 2014-12-23 LAB — BASIC METABOLIC PANEL
BUN: 33 mg/dL — ABNORMAL HIGH (ref 6–23)
CALCIUM: 9.2 mg/dL (ref 8.4–10.5)
CHLORIDE: 100 meq/L (ref 96–112)
CO2: 29 mEq/L (ref 19–32)
CREATININE: 0.96 mg/dL (ref 0.40–1.50)
GFR: 79.34 mL/min (ref >60.00)
Glucose, Bld: 128 mg/dL — ABNORMAL HIGH (ref 70–99)
POTASSIUM: 3.8 meq/L (ref 3.5–5.1)
SODIUM: 138 meq/L (ref 135–145)

## 2014-12-23 LAB — HEMOGLOBIN A1C: HEMOGLOBIN A1C: 7.5 % — AB (ref 4.6–6.5)

## 2014-12-23 MED ORDER — SITAGLIPTIN PHOSPHATE 100 MG PO TABS: 100.0000 mg | ORAL_TABLET | Freq: Every day | ORAL | Status: DC

## 2014-12-23 NOTE — Patient Instructions (Addendum)
blood tests are requested for you today.  We'll contact you with results. If the blood sugar is high, i'll prescribe for you a pill called "januvia."  Please come back for a follow-up appointment in 3 months.

## 2014-12-23 NOTE — Progress Notes (Signed)
Subjective:    Patient ID: [name], male    DOB: 1929-01-05, 81 y.o.   MRN: [id number]  HPI The state of at least three ongoing medical problems is addressed today, with interval history of each noted here: Hypokalemia: pt says he takes Klor, 5x20 mEq, BID.  He denies cramps.  He says he never misses the medication.   CHF: edema persists. Pt returns for f/u of diabetes mellitus: DM type: 2 Dx'ed: 2008 Complications: nephropathy Therapy: none DKA: never Severe hypoglycemia: never Pancreatitis: never Interval history: denies weight change.   Past Medical History  Diagnosis Date  . CHF (congestive heart failure)   . HTN (hypertension)   . OA (osteoarthritis)   . BPH (benign prostatic hyperplasia)   . Obesity   . Anemia   . Diabetes mellitus type II   . Atrial fibrillation   . Cardiomyopathy   . Hyperlipidemia   . Peptic ulcer   . Bradycardia   . History of colonoscopy   . ICD (implantable cardiac defibrillator) in place 10/14/2012    biventricular  . GERD (gastroesophageal reflux disease)     Past Surgical History  Procedure Laterality Date  . L-spine  1965  . Total knee arthroplasty  1997    right  . Lumbar l4-5 & s1  02/2000  . Esophagogastroduodenoscopy  06/24/2002    History   Social History  . Marital Status: Married    Spouse Name: N/A    Number of Children: N/A  . Years of Education: N/A   Occupational History  . Retired    Social History Main Topics  . Smoking status: Never Smoker   . Smokeless tobacco: Never Used  . Alcohol Use: No  . Drug Use: No  . Sexual Activity: Not on file   Other Topics Concern  . Not on file   Social History Narrative    Current Outpatient Prescriptions on File Prior to Visit  Medication Sig Dispense Refill  . acetaminophen (TYLENOL) 325 MG tablet Take 650 mg by mouth every 6 (six) hours as needed. For pain    . allopurinol (ZYLOPRIM) 100 MG tablet TAKE 1 TABLET BY MOUTH EVERY DAY 30 tablet 0  .  atorvastatin (LIPITOR) 20 MG tablet TAKE 1 TABLET BY MOUTH DAILY 30 tablet 0  . carvedilol (COREG) 12.5 MG tablet Take 1 tablet (12.5 mg total) by mouth 2 (two) times daily with a meal. 60 tablet 6  . furosemide (LASIX) 80 MG tablet Take 1 tablet (80 mg total) by mouth 2 (two) times daily. 60 tablet 11  . lisinopril (PRINIVIL,ZESTRIL) 40 MG tablet TAKE 1 TABLET BY MOUTH EVERY DAY 30 tablet 0  . metolazone (ZAROXOLYN) 2.5 MG tablet TAKE 1 TABLET BY MOUTH DAILY 30 tablet 0  . potassium chloride SA (K-DUR,KLOR-CON) 20 MEQ tablet 5 tablets, twice daily    . pravastatin (PRAVACHOL) 40 MG tablet Take 40 mg by mouth daily.    . tamsulosin (FLOMAX) 0.4 MG CAPS capsule TAKE ONE CAPSULE BY MOUTH TWICE DAILY 60 capsule 2  . warfarin (COUMADIN) 5 MG tablet Take 1 tablet by mouth daily or as directed by coumadin clinic 90 tablet 0   No current facility-administered medications on file prior to visit.    No Known Allergies  Family History  Problem Relation Age of Onset  . Cancer Neg Hx   . Heart disease Father   . Heart disease Mother   . Diabetes Mother   . Hypertension Brother   .  Hypertension Brother     BP 130/88 mmHg  Pulse 90  Temp(Src) 97.9 F (36.6 C) (Oral)  Ht  (1.702 m)  Wt 258 lb (117.028 kg)  BMI 40.40 kg/m2  SpO2 97%    Review of Systems Denies chest pain and sob    Objective:   Physical Exam VITAL SIGNS:  See vs page.   GENERAL: no distress.   LUNGS:  Clear to auscultation Pulses: dorsalis pedis intact bilat.  Feet: no deformity. no ulcer on the feet. feet are of normal temp, but are cyanotic. 2+ bilat leg edema. bilat varicosities. bilat rust discoloration of the legs. Heavy callus on the plantar aspect of the right foot, under the MTP joint. There is bilateral onychomycosis.  Neuro: sensation is intact to touch on the feet.    Lab Results  Component Value Date   HGBA1C 7.5* 12/23/2014   Lab Results  Component Value Date   CREATININE 0.96 12/23/2014    BUN 33* 12/23/2014   NA 138 12/23/2014   K 3.8 12/23/2014   CL 100 12/23/2014   CO2 29 12/23/2014      Assessment & Plan:  DM: mild exacerbation CHF: well-controlled Hypokalemia: well-replaced  Patient is advised the following: Patient Instructions  blood tests are requested for you today.  We'll contact you with results. If the blood sugar is high, i'll prescribe for you a pill called "januvia."  Please come back for a follow-up appointment in 3 months.   addendum: i have sent a prescription to your pharmacy, to take "[place]."

## 2014-12-29 ENCOUNTER — Ambulatory Visit (INDEPENDENT_AMBULATORY_CARE_PROVIDER_SITE_OTHER): Payer: Medicare Other | Admitting: *Deleted

## 2014-12-29 DIAGNOSIS — I4891 Unspecified atrial fibrillation: Secondary | ICD-10-CM | POA: Diagnosis not present

## 2014-12-29 DIAGNOSIS — Z7901 Long term (current) use of anticoagulants: Secondary | ICD-10-CM

## 2014-12-29 DIAGNOSIS — Z5181 Encounter for therapeutic drug level monitoring: Secondary | ICD-10-CM

## 2014-12-29 LAB — POCT INR: INR: 3

## 2015-01-07 ENCOUNTER — Other Ambulatory Visit: Payer: Self-pay | Admitting: Endocrinology

## 2015-01-12 DIAGNOSIS — M1712 Unilateral primary osteoarthritis, left knee: Secondary | ICD-10-CM | POA: Diagnosis not present

## 2015-01-20 ENCOUNTER — Ambulatory Visit: Payer: Medicare Other | Admitting: Podiatry

## 2015-01-24 ENCOUNTER — Telehealth: Payer: Self-pay | Admitting: Cardiology

## 2015-01-24 NOTE — Telephone Encounter (Signed)
Received records from [facility] for appointment with Dr Jens Som on 02/02/15.  Records given to [facility] (medical records) for Dr Ludwig Clarks schedule on 02/02/15.

## 2015-01-26 ENCOUNTER — Ambulatory Visit (INDEPENDENT_AMBULATORY_CARE_PROVIDER_SITE_OTHER): Payer: Medicare Other | Admitting: *Deleted

## 2015-01-26 DIAGNOSIS — Z5181 Encounter for therapeutic drug level monitoring: Secondary | ICD-10-CM | POA: Diagnosis not present

## 2015-01-26 DIAGNOSIS — I4891 Unspecified atrial fibrillation: Secondary | ICD-10-CM

## 2015-01-26 DIAGNOSIS — Z7901 Long term (current) use of anticoagulants: Secondary | ICD-10-CM | POA: Diagnosis not present

## 2015-01-26 LAB — POCT INR: INR: 2.4

## 2015-01-31 DIAGNOSIS — M1712 Unilateral primary osteoarthritis, left knee: Secondary | ICD-10-CM | POA: Diagnosis not present

## 2015-02-01 NOTE — Progress Notes (Signed)
HPI: FU nonischemic cardiomyopathy as well as atrial fibrillation. Note, he had a catheterization in January 2006 that showed no coronary artery disease and an ejection fraction of 40%. He had a Myoview performed last on May 04, 2008, that showed an ejection fraction of 31%. There was felt to be a prior inferior infarct with mild peri-infarct ischemia. I did review this and felt there was a low risk and we have been treating medically. He does have permanent atrial fibrillation as well. Holter monitor in August of 2011 showed mildly reduced rate and we decreased his Coreg. His last echocardiogram was performed in August of 2013. His ejection fraction was 30-35%. There was mild to moderate mitral regurgitation. The left atrium was mild to moderately dilated as was the right atrium. In November of 2013 the patient had a biventricular ICD placed. Since I last saw him, he denies dyspnea, chest pain, palpitations or syncope. He has chronic unchanged pedal edema. Note he can walk and climb stairs with no symptoms. His left knee will need to be replaced and that is his limiting factor.  Current Outpatient Prescriptions  Medication Sig Dispense Refill  . acetaminophen (TYLENOL) 325 MG tablet Take 650 mg by mouth every 6 (six) hours as needed. For pain    . allopurinol (ZYLOPRIM) 100 MG tablet TAKE 1 TABLET BY MOUTH EVERY DAY 30 tablet 0  . atorvastatin (LIPITOR) 20 MG tablet TAKE 1 TABLET BY MOUTH DAILY 30 tablet 0  . carvedilol (COREG) 12.5 MG tablet Take 1 tablet (12.5 mg total) by mouth 2 (two) times daily with a meal. 60 tablet 6  . furosemide (LASIX) 80 MG tablet Take 1 tablet (80 mg total) by mouth 2 (two) times daily. 60 tablet 11  . lisinopril (PRINIVIL,ZESTRIL) 40 MG tablet TAKE 1 TABLET BY MOUTH EVERY DAY 30 tablet 0  . metolazone (ZAROXOLYN) 2.5 MG tablet TAKE 1 TABLET BY MOUTH DAILY 30 tablet 0  . potassium chloride SA (K-DUR,KLOR-CON) 20 MEQ tablet 5 tablets, twice daily    . pravastatin  (PRAVACHOL) 40 MG tablet Take 40 mg by mouth daily.    . sitaGLIPtin (JANUVIA) 100 MG tablet Take 1 tablet (100 mg total) by mouth daily. 30 tablet 11  . tamsulosin (FLOMAX) 0.4 MG CAPS capsule TAKE ONE CAPSULE BY MOUTH TWICE DAILY 60 capsule 0  . warfarin (COUMADIN) 5 MG tablet Take 1 tablet by mouth daily or as directed by coumadin clinic 90 tablet 0   No current facility-administered medications for this visit.     Past Medical History  Diagnosis Date  . CHF (congestive heart failure)   . HTN (hypertension)   . OA (osteoarthritis)   . BPH (benign prostatic hyperplasia)   . Obesity   . Anemia   . Diabetes mellitus type II   . Atrial fibrillation   . Cardiomyopathy   . Hyperlipidemia   . Peptic ulcer   . Bradycardia   . History of colonoscopy   . ICD (implantable cardiac defibrillator) in place 10/14/2012    biventricular  . GERD (gastroesophageal reflux disease)     Past Surgical History  Procedure Laterality Date  . L-spine  1965  . Total knee arthroplasty  1997    right  . Lumbar l4-5 & s1  02/2000  . Esophagogastroduodenoscopy  06/24/2002    History   Social History  . Marital Status: Married    Spouse Name: N/A  . Number of Children: N/A  . Years of Education:  N/A   Occupational History  . Retired    Social History Main Topics  . Smoking status: Never Smoker   . Smokeless tobacco: Never Used  . Alcohol Use: No  . Drug Use: No  . Sexual Activity: Not on file   Other Topics Concern  . Not on file   Social History Narrative    ROS: chronic left knee pain but no fevers or chills, productive cough, hemoptysis, dysphasia, odynophagia, melena, hematochezia, dysuria, hematuria, rash, seizure activity, orthopnea, PND, claudication. Remaining systems are negative.  Physical Exam: Well-developed obese in no acute distress.  Skin is warm and dry.  HEENT is normal.  Neck is supple.  Chest is clear to auscultation with normal expansion.  Cardiovascular exam  is regular rate and rhythm.  Abdominal exam nontender or distended. No masses palpated. Extremities show 2+ edema. neuro grossly intact  ECG ventricular pacing with underlying atrial fibrillation.

## 2015-02-02 ENCOUNTER — Encounter: Payer: Self-pay | Admitting: *Deleted

## 2015-02-02 ENCOUNTER — Ambulatory Visit (INDEPENDENT_AMBULATORY_CARE_PROVIDER_SITE_OTHER): Payer: Medicare Other | Admitting: Cardiology

## 2015-02-02 ENCOUNTER — Ambulatory Visit (INDEPENDENT_AMBULATORY_CARE_PROVIDER_SITE_OTHER): Payer: Medicare Other | Admitting: Pharmacist Clinician (PhC)/ Clinical Pharmacy Specialist

## 2015-02-02 ENCOUNTER — Encounter: Payer: Self-pay | Admitting: Cardiology

## 2015-02-02 VITALS — BP 120/84 | HR 91 | Ht 68.5 in | Wt 257.4 lb

## 2015-02-02 DIAGNOSIS — I4891 Unspecified atrial fibrillation: Secondary | ICD-10-CM

## 2015-02-02 DIAGNOSIS — Z01810 Encounter for preprocedural cardiovascular examination: Secondary | ICD-10-CM

## 2015-02-02 DIAGNOSIS — R609 Edema, unspecified: Secondary | ICD-10-CM | POA: Diagnosis not present

## 2015-02-02 DIAGNOSIS — I482 Chronic atrial fibrillation, unspecified: Secondary | ICD-10-CM

## 2015-02-02 DIAGNOSIS — I10 Essential (primary) hypertension: Secondary | ICD-10-CM | POA: Diagnosis not present

## 2015-02-02 DIAGNOSIS — I5022 Chronic systolic (congestive) heart failure: Secondary | ICD-10-CM | POA: Diagnosis not present

## 2015-02-02 DIAGNOSIS — Z95810 Presence of automatic (implantable) cardiac defibrillator: Secondary | ICD-10-CM | POA: Diagnosis not present

## 2015-02-02 DIAGNOSIS — Z7901 Long term (current) use of anticoagulants: Secondary | ICD-10-CM

## 2015-02-02 DIAGNOSIS — E785 Hyperlipidemia, unspecified: Secondary | ICD-10-CM

## 2015-02-02 DIAGNOSIS — Z5181 Encounter for therapeutic drug level monitoring: Secondary | ICD-10-CM

## 2015-02-02 LAB — POCT INR: INR: 3.4

## 2015-02-02 MED ORDER — ENOXAPARIN SODIUM 120 MG/0.8ML ~~LOC~~ SOLN: 120.0000 mg | Freq: Two times a day (BID) | SUBCUTANEOUS | Status: DC

## 2015-02-02 NOTE — Assessment & Plan Note (Signed)
Continue statin. 

## 2015-02-02 NOTE — Assessment & Plan Note (Signed)
Blood pressure controlled. Continue present medications. 

## 2015-02-02 NOTE — Assessment & Plan Note (Signed)
Patient with history of nonischemic cardiomyopathy. Last nuclear study is low risk. No chest pain or dyspnea. I will not plan to pursue further cardiac evaluation preoperatively. He will need a Lovenox bridge given multiple embolic risk factors.

## 2015-02-02 NOTE — Assessment & Plan Note (Signed)
Followed by electrophysiology. 

## 2015-02-02 NOTE — Assessment & Plan Note (Signed)
Continue present dose of diuretics. 

## 2015-02-02 NOTE — H&P (Signed)
TOTAL KNEE ADMISSION H&P  Patient is being admitted for left total knee arthroplasty.  Subjective:  Chief Complaint:   Left knee primary OA / pain.  HPI: [name], 81 y.o. male, has a history of pain and functional disability in the left knee due to arthritis and has failed non-surgical conservative treatments for greater than 12 weeks to include NSAID's and/or analgesics, corticosteriod injections, use of assistive devices and activity modification.  Onset of symptoms was gradual, starting 5+ years ago with gradually worsening course since that time. The patient noted prior procedures on the knee to include  arthroplasty on the right knee per Dr. Fannie Knee in 1997.  Patient currently rates pain in the left knee(s) at 7 out of 10 with activity. Patient has worsening of pain with activity and weight bearing, pain that interferes with activities of daily living, pain with passive range of motion, crepitus and joint swelling.  Patient has evidence of periarticular osteophytes and joint space narrowing by imaging studies. There is no active infection.  Risks, benefits and expectations were discussed with the patient.  Risks including but not limited to the risk of anesthesia, blood clots, nerve damage, blood vessel damage, failure of the prosthesis, infection and up to and including death.  Patient understand the risks, benefits and expectations and wishes to proceed with surgery.   PCP: Romero Belling, MD  D/C Plans:      Home with HHPT/SNF  Post-op Meds:       No Rx given   Tranexamic Acid:      To be given - topically  Decadron:      Is to be given  FYI:     Coumadin with branching Lovenox  Norco post-op    Patient Active Problem List   Diagnosis Date Noted  . Preop cardiovascular exam 02/02/2015  . Encounter for therapeutic drug monitoring 03/31/2014  . Automatic implantable cardioverter-defibrillator in situ 10/16/2012  . 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 Screening for prostate  cancer 04/11/2012  . Encounter for long-term (current) use of other medications 04/11/2012  . Encounter for long-term (current) use of anticoagulants 02/25/2012  . Diabetes 10/18/2010  . MEMORY LOSS 08/30/2010  . HYPOKALEMIA 07/31/2010  . Thrombocytopenia 06/20/2009  . Hyperuricemia 06/20/2009  . OTHER PRIMARY CARDIOMYOPATHIES 04/29/2009  . Dyslipidemia 02/21/2009  . BRADYCARDIA 02/21/2009  . CHRON/UNSPEC PEPTC ULCER UNSPEC SITE W/PERF&OBST 02/21/2009  . LOC OSTEOARTHROS NOT SPEC PRIM/SEC UNSPEC SITE 02/21/2009  . EDEMA 02/21/2009  . NEPHROPATHY, DIABETIC 03/16/2008  . ATRIAL FIBRILLATION 03/16/2008  . ANEMIA-NOS 08/09/2007  . Essential hypertension 08/09/2007  . MYOCARDIAL INFARCTION, HX OF 08/09/2007  . Congestive heart failure 08/09/2007  . OSTEOARTHRITIS 08/09/2007   Past Medical History  Diagnosis Date  . CHF (congestive heart failure)   . HTN (hypertension)   . OA (osteoarthritis)   . BPH (benign prostatic hyperplasia)   . Obesity   . Anemia   . Diabetes mellitus type II   . Atrial fibrillation   . Cardiomyopathy   . Hyperlipidemia   . Peptic ulcer   . Bradycardia   . History of colonoscopy   . ICD (implantable cardiac defibrillator) in place 10/14/2012    biventricular  . GERD (gastroesophageal reflux disease)     Past Surgical History  Procedure Laterality Date  . L-spine  1965  . Total knee arthroplasty  1997    right  . Lumbar l4-5 & s1  02/2000  . Esophagogastroduodenoscopy  06/24/2002    No prescriptions prior to admission  No Known Allergies  History  Substance Use Topics  . Smoking status: Never Smoker   . Smokeless tobacco: Never Used  . Alcohol Use: No    Family History  Problem Relation Age of Onset  . Cancer Neg Hx   . Heart disease Father   . Heart disease Mother   . Diabetes Mother   . Hypertension Brother   . Hypertension Brother      Review of Systems  Constitutional: Negative.   HENT: Positive for hearing loss.   Eyes:  Negative.   Respiratory: Negative.   Cardiovascular: Negative.   Gastrointestinal: Positive for heartburn.  Genitourinary: Negative.   Musculoskeletal: Positive for back pain and joint pain.  Skin: Negative.   Neurological: Negative.   Endo/Heme/Allergies: Negative.   Psychiatric/Behavioral: Positive for memory loss.    Objective:  Physical Exam  Constitutional: He is oriented to person, place, and time. He appears well-developed and well-nourished.  HENT:  Head: Normocephalic and atraumatic.  Eyes: Pupils are equal, round, and reactive to light.  Neck: Neck supple. No JVD present. No tracheal deviation present. No thyromegaly present.  Cardiovascular: Normal rate, regular rhythm and intact distal pulses.   Respiratory: Effort normal and breath sounds normal. No stridor. No respiratory distress. He has no wheezes.  GI: Soft. There is no tenderness. There is no guarding.  Musculoskeletal:       Left knee: He exhibits decreased range of motion, swelling, abnormal alignment and bony tenderness. He exhibits no ecchymosis, no deformity, no laceration and no erythema. Tenderness found.  Lymphadenopathy:    He has no cervical adenopathy.  Neurological: He is alert and oriented to person, place, and time.  Skin: Skin is warm and dry.  Psychiatric: He has a normal mood and affect.    Vital signs in last 24 hours: Pulse Rate:  [91] 91 (02/24 1142) BP: (120)/(84) 120/84 mmHg (02/24 1142) Weight:  [116.756 kg (257 lb 6.4 oz)] 116.756 kg (257 lb 6.4 oz) (02/24 1142)  Labs:   Estimated body mass index is 40.40 kg/(m^2) as calculated from the following:   Height as of 12/23/14:  (1.702 m).   Weight as of 12/23/14: 117.028 kg (258 lb).   Imaging Review Plain radiographs demonstrate severe degenerative joint disease of the left knee(s). The overall alignment is mild varus. The bone quality appears to be good for age and reported activity level.  Assessment/Plan:  End stage  arthritis, left knee   The patient history, physical examination, clinical judgment of the provider and imaging studies are consistent with end stage degenerative joint disease of the left knee(s) and total knee arthroplasty is deemed medically necessary. The treatment options including medical management, injection therapy arthroscopy and arthroplasty were discussed at length. The risks and benefits of total knee arthroplasty were presented and reviewed. The risks due to aseptic loosening, infection, stiffness, patella tracking problems, thromboembolic complications and other imponderables were discussed. The patient acknowledged the explanation, agreed to proceed with the plan and consent was signed. Patient is being admitted for inpatient treatment for surgery, pain control, PT, OT, prophylactic antibiotics, VTE prophylaxis, progressive ambulation and ADL's and discharge planning. The patient is planning to be discharged to skilled nursing facility / home.    Anastasio Auerbach Jessy Cybulski   PA-C  02/02/2015, 5:42 PM

## 2015-02-02 NOTE — Assessment & Plan Note (Signed)
Continue ACE inhibitor and beta blocker. 

## 2015-02-02 NOTE — Assessment & Plan Note (Signed)
Patient remains in permanent atrial fibrillation. Continue Coreg for rate control. Continue Coumadin. Multiple embolic risk factors. I would therefore plan a Lovenox bridge at time of upcoming knee replacement.

## 2015-02-02 NOTE — Patient Instructions (Signed)
Your physician wants you to follow-up in: 6 MONTHS WITH DR CRENSHAW You will receive a reminder letter in the mail two months in advance. If you don't receive a letter, please call our office to schedule the follow-up appointment.  

## 2015-02-02 NOTE — Assessment & Plan Note (Signed)
Patient is doing well from a symptomatic standpoint. He has chronic pedal edema which is unchanged. Continue ACE inhibitor, beta blocker and present dose of diuretics.

## 2015-02-02 NOTE — Patient Instructions (Addendum)
Enoxaprin Dosing Schedule  Enoxparin dose: 120 mg  Date  Warfarin Dose (evenings) Enoxaprin Dose  3-2 6 5  mg (1 tab)   3-3 5 0   3-4 4 0 8am   8pm  3-5 3 0 8am   8pm  3-6 2 0 8am   8pm  3-7 1 0 8am  3-8 Procedure    3-9 1    3-10 2    3-11 3    3-12 4    3-13 5    3-14 6

## 2015-02-04 ENCOUNTER — Ambulatory Visit: Payer: Medicare Other | Admitting: Cardiology

## 2015-02-07 ENCOUNTER — Other Ambulatory Visit: Payer: Self-pay | Admitting: Endocrinology

## 2015-02-08 ENCOUNTER — Encounter (HOSPITAL_COMMUNITY)
Admission: RE | Admit: 2015-02-08 | Discharge: 2015-02-08 | Disposition: A | Discharge status: Home / Self Care | Payer: Medicare Other | Source: Ambulatory Visit | Attending: Orthopedic Surgery | Admitting: Orthopedic Surgery

## 2015-02-08 ENCOUNTER — Other Ambulatory Visit: Payer: Self-pay | Admitting: Cardiology

## 2015-02-08 ENCOUNTER — Encounter (HOSPITAL_COMMUNITY): Payer: Self-pay

## 2015-02-08 DIAGNOSIS — Z01812 Encounter for preprocedural laboratory examination: Secondary | ICD-10-CM | POA: Diagnosis not present

## 2015-02-08 DIAGNOSIS — M1712 Unilateral primary osteoarthritis, left knee: Secondary | ICD-10-CM | POA: Diagnosis not present

## 2015-02-08 LAB — URINALYSIS, ROUTINE W REFLEX MICROSCOPIC
BILIRUBIN URINE: NEGATIVE
Glucose, UA: NEGATIVE mg/dL
Hgb urine dipstick: NEGATIVE
Ketones, ur: NEGATIVE mg/dL
Leukocytes, UA: NEGATIVE
Nitrite: NEGATIVE
Protein, ur: NEGATIVE mg/dL
SPECIFIC GRAVITY, URINE: 1.017 (ref 1.005–1.030)
UROBILINOGEN UA: 0.2 mg/dL (ref 0.0–1.0)
pH: 5 (ref 5.0–8.0)

## 2015-02-08 LAB — ABO/RH: ABO/RH(D): O POS

## 2015-02-08 LAB — CBC
HCT: 41.3 % (ref 39.0–52.0)
Hemoglobin: 13.5 g/dL (ref 13.0–17.0)
MCH: 31 pg (ref 26.0–34.0)
MCHC: 32.7 g/dL (ref 30.0–36.0)
MCV: 94.7 fL (ref 78.0–100.0)
PLATELETS: 123 10*3/uL — AB (ref 150–400)
RBC: 4.36 MIL/uL (ref 4.22–5.81)
RDW: 14.2 % (ref 11.5–15.5)
WBC: 6.8 10*3/uL (ref 4.0–10.5)

## 2015-02-08 LAB — BASIC METABOLIC PANEL
ANION GAP: 7 (ref 5–15)
BUN: 20 mg/dL (ref 6–23)
CHLORIDE: 104 mmol/L (ref 96–112)
CO2: 27 mmol/L (ref 19–32)
Calcium: 9.4 mg/dL (ref 8.4–10.5)
Creatinine, Ser: 0.86 mg/dL (ref 0.50–1.35)
GFR calc Af Amer: >90 mL/min (ref >90)
GFR calc non Af Amer: 78 mL/min — ABNORMAL LOW (ref >90)
Glucose, Bld: 127 mg/dL — ABNORMAL HIGH (ref 70–99)
Potassium: 5.3 mmol/L — ABNORMAL HIGH (ref 3.5–5.1)
Sodium: 138 mmol/L (ref 135–145)

## 2015-02-08 LAB — SURGICAL PCR SCREEN
MRSA, PCR: NEGATIVE
STAPHYLOCOCCUS AUREUS: NEGATIVE

## 2015-02-08 LAB — PROTIME-INR
INR: 2.35 — AB (ref 0.00–1.49)
Prothrombin Time: 25.9 seconds — ABNORMAL HIGH (ref 11.6–15.2)

## 2015-02-08 LAB — APTT: aPTT: 39 seconds — ABNORMAL HIGH (ref 24–37)

## 2015-02-08 MED ORDER — CHLORHEXIDINE GLUCONATE 4 % EX LIQD
60.0000 mL | Freq: Once | CUTANEOUS | Status: DC
  Filled 2015-02-08: qty 60

## 2015-02-08 NOTE — Progress Notes (Signed)
Faxed cbc to dr Charlann Boxer via epic

## 2015-02-08 NOTE — Progress Notes (Signed)
   02/08/15 1726  OBSTRUCTIVE SLEEP APNEA  Have you ever been diagnosed with sleep apnea through a sleep study? No  Do you snore loudly (loud enough to be heard through closed doors)?  0  Do you often feel tired, fatigued, or sleepy during the daytime? 0  Has anyone observed you stop breathing during your sleep? 0  Do you have, or are you being treated for high blood pressure? 1  BMI more than 35 kg/m2? 1  Age over 79 years old? 1  Neck circumference greater than 40 cm/16 inches? 1  Gender: 1

## 2015-02-08 NOTE — Patient Instructions (Signed)
Your procedure is scheduled on:  02/15/15  TUESDAY  Report to [facility]-- MAIN ENTRANCE- FOLLOW SIGNS TO SHORT STAY CENTER Short Stay Center at    2:10  PM  Call this number if you have problems the morning of surgery: [phone]        Do not eat food After Midnight.  Monday NIGHT.  MAY HAVE CLEAR LIQUIDS UNTIL 11:00 AM-  THEN NOTHING BY MOUTH   Take these medicines the morning of surgery with A SIP OF WATER: CARVEDILOL, allupirinol   .  Contacts, dentures or partial plates, or metal hairpins  can not be worn to surgery. Your family will be responsible for glasses, dentures, hearing aides while you are in surgery  Leave suitcase in the car. After surgery it may be brought to your room.  For patients admitted to the hospital, checkout time is 11:00 AM day of  discharge.         [place] IS NOT RESPONSIBLE FOR ANY VALUABLES  Patients discharged the day of surgery will not be allowed to drive home. IF going home the day of surgery, you must have a driver and someone to stay with you for the first 24 hours                                                                                                                                          [name] - Preparing for Surgery Before surgery, you can play an important role.  Because skin is not sterile, your skin needs to be as free of germs as possible.  You can reduce the number of germs on your skin by washing with CHG (chlorahexidine gluconate) soap before surgery.  CHG is an antiseptic cleaner which kills germs and bonds with the skin to continue killing germs even after washing. Please DO NOT use if you have an allergy to CHG or antibacterial soaps.  If your skin becomes reddened/irritated stop using the CHG and inform your nurse when you arrive at Short Stay. Do not shave (including legs and underarms) for at least 48 hours prior to the first CHG shower.  You may shave your face/neck. Please follow these instructions  carefully:  1.  Shower with CHG Soap the night before surgery and the  morning of Surgery.  2.  If you choose to wash your hair, wash your hair first as usual with your  normal  shampoo.  3.  After you shampoo, rinse your hair and body thoroughly to remove the  shampoo.                           4.  Use CHG as you would any other liquid soap.  You can apply chg directly  to the skin and wash  Gently with a scrungie or clean washcloth.  5.  Apply the CHG Soap to your body ONLY FROM THE NECK DOWN.   Do not use on face/ open                           Wound or open sores. Avoid contact with eyes, ears mouth and genitals (private parts).                       Wash face,  Genitals (private parts) with your normal soap.             6.  Wash thoroughly, paying special attention to the area where your surgery  will be performed.  7.  Thoroughly rinse your body with warm water from the neck down.  8.  DO NOT shower/wash with your normal soap after using and rinsing off  the CHG Soap.                9.  Pat yourself dry with a clean towel.            10.  Wear clean pajamas.            11.  Place clean sheets on your bed the night of your first shower and do not  sleep with pets. Day of Surgery : Do not apply any lotions/deodorants the morning of surgery.  Please wear clean clothes to the hospital/surgery center.  FAILURE TO FOLLOW THESE INSTRUCTIONS MAY RESULT IN THE CANCELLATION OF YOUR SURGERY PATIENT SIGNATURE_________________________________  NURSE SIGNATURE__________________________________  ________________________________________________________________________  WHAT IS A BLOOD TRANSFUSION? Blood Transfusion Information  A transfusion is the replacement of blood or some of its parts. Blood is made up of multiple cells which provide different functions.  Red blood cells carry oxygen and are used for blood loss replacement.  White blood cells fight against  infection.  Platelets control bleeding.  Plasma helps clot blood.  Other blood products are available for specialized needs, such as hemophilia or other clotting disorders. BEFORE THE TRANSFUSION  Who gives blood for transfusions?   Healthy volunteers who are fully evaluated to make sure their blood is safe. This is blood bank blood. Transfusion therapy is the safest it has ever been in the practice of medicine. Before blood is taken from a donor, a complete history is taken to make sure that person has no history of diseases nor engages in risky social behavior (examples are intravenous drug use or sexual activity with multiple partners). The donor's travel history is screened to minimize risk of transmitting infections, such as malaria. The donated blood is tested for signs of infectious diseases, such as HIV and hepatitis. The blood is then tested to be sure it is compatible with you in order to minimize the chance of a transfusion reaction. If you or a relative donates blood, this is often done in anticipation of surgery and is not appropriate for emergency situations. It takes many days to process the donated blood. RISKS AND COMPLICATIONS Although transfusion therapy is very safe and saves many lives, the main dangers of transfusion include:  1. Getting an infectious disease. 2. Developing a transfusion reaction. This is an allergic reaction to something in the blood you were given. Every precaution is taken to prevent this. The decision to have a blood transfusion has been considered carefully by your caregiver before blood is given. Blood is not given unless the benefits outweigh  the risks. AFTER THE TRANSFUSION  Right after receiving a blood transfusion, you will usually feel much better and more energetic. This is especially true if your red blood cells have gotten low (anemic). The transfusion raises the level of the red blood cells which carry oxygen, and this usually causes an energy  increase.  The nurse administering the transfusion will monitor you carefully for complications. HOME CARE INSTRUCTIONS  No special instructions are needed after a transfusion. You may find your energy is better. Speak with your caregiver about any limitations on activity for underlying diseases you may have. SEEK MEDICAL CARE IF:   Your condition is not improving after your transfusion.  You develop redness or irritation at the intravenous (IV) site. SEEK IMMEDIATE MEDICAL CARE IF:  Any of the following symptoms occur over the next 12 hours:  Shaking chills.  You have a temperature by mouth above 102 F (38.9 C), not controlled by medicine.  Chest, back, or muscle pain.  People around you feel you are not acting correctly or are confused.  Shortness of breath or difficulty breathing.  Dizziness and fainting.  You get a rash or develop hives.  You have a decrease in urine output.  Your urine turns a dark color or changes to pink, red, or brown. Any of the following symptoms occur over the next 10 days:  You have a temperature by mouth above 102 F (38.9 C), not controlled by medicine.  Shortness of breath.  Weakness after normal activity.  The white part of the eye turns yellow (jaundice).  You have a decrease in the amount of urine or are urinating less often.  Your urine turns a dark color or changes to pink, red, or brown. Document Released: 11/23/2000 Document Revised: 02/18/2012 Document Reviewed: 07/12/2008 Exit[employer] Patient Information 2014 ExitCare, [place].  _______________________________________________________________________   CLEAR LIQUID DIET   Foods Allowed                                                                     Foods Excluded  Coffee and tea, regular and decaf                             liquids that you cannot  Plain Jell-O in any flavor                                             see through such as: Fruit ices (not with fruit  pulp)                                     milk, soups, orange juice  Iced Popsicles                                    All solid food Carbonated beverages, regular and diet  Cranberry, grape and apple juices Sports drinks like Gatorade Lightly seasoned clear broth or consume(fat free) Sugar, honey syrup  Sample Menu Breakfast                                Lunch                                     Supper Cranberry juice                    Beef broth                            Chicken broth Jell-O                                     Grape juice                           Apple juice Coffee or tea                        Jell-O                                      Popsicle                                                Coffee or tea                        Coffee or tea  _____________________________________________________________________    Incentive Spirometer  An incentive spirometer is a tool that can help keep your lungs clear and active. This tool measures how well you are filling your lungs with each breath. Taking long deep breaths may help reverse or decrease the chance of developing breathing (pulmonary) problems (especially infection) following:  A long period of time when you are unable to move or be active. BEFORE THE PROCEDURE   If the spirometer includes an indicator to show your best effort, your nurse or respiratory therapist will set it to a desired goal.  If possible, sit up straight or lean slightly forward. Try not to slouch.  Hold the incentive spirometer in an upright position. INSTRUCTIONS FOR USE  3. Sit on the edge of your bed if possible, or sit up as far as you can in bed or on a chair. 4. Hold the incentive spirometer in an upright position. 5. Breathe out normally. 6. Place the mouthpiece in your mouth and seal your lips tightly around it. 7. Breathe in slowly and as deeply as possible, raising the piston or the ball  toward the top of the column. 8. Hold your breath for 3-5 seconds or for as long as possible. Allow the piston or ball to fall to the bottom of the column. 9. Remove the mouthpiece from your mouth and breathe out normally. 10. Rest for a few seconds and repeat Steps 1 through 7 at  least 10 times every 1-2 hours when you are awake. Take your time and take a few normal breaths between deep breaths. 11. The spirometer may include an indicator to show your best effort. Use the indicator as a goal to work toward during each repetition. 12. After each set of 10 deep breaths, practice coughing to be sure your lungs are clear. If you have an incision (the cut made at the time of surgery), support your incision when coughing by placing a pillow or rolled up towels firmly against it. Once you are able to get out of bed, walk around indoors and cough well. You may stop using the incentive spirometer when instructed by your caregiver.  RISKS AND COMPLICATIONS  Take your time so you do not get dizzy or light-headed.  If you are in pain, you may need to take or ask for pain medication before doing incentive spirometry. It is harder to take a deep breath if you are having pain. AFTER USE  Rest and breathe slowly and easily.  It can be helpful to keep track of a log of your progress. Your caregiver can provide you with a simple table to help with this. If you are using the spirometer at home, follow these instructions: SEEK MEDICAL CARE IF:   You are having difficultly using the spirometer.  You have trouble using the spirometer as often as instructed.  Your pain medication is not giving enough relief while using the spirometer.  You develop fever of 100.5 F (38.1 C) or higher. SEEK IMMEDIATE MEDICAL CARE IF:   You cough up bloody sputum that had not been present before.  You develop fever of 102 F (38.9 C) or greater.  You develop worsening pain at or near the incision site. MAKE SURE YOU:    Understand these instructions.  Will watch your condition.  Will get help right away if you are not doing well or get worse. Document Released: 04/08/2007 Document Revised: 02/18/2012 Document Reviewed: 06/09/2007 [facility] Patient Information 2014 [place], [place].   ________________________________________________________________________

## 2015-02-08 NOTE — Progress Notes (Signed)
Daughter has been given instructions regarding the stopping of coumadin and beginning of LOVONOX- copy made for chart.Clearance dr Lady Gary chart, clearance dr Jens Som with lov note 02/01/15 chart,  Last interrogation 11/15 epic, ekg 2/16 epic

## 2015-02-09 NOTE — Telephone Encounter (Signed)
lovenox refill refused - refilled 2/24 Spoke with daughter - she has not picked up Rx yet, as patient is not to start medication until 02/11/15 but states she talked with pharmacy and they have Rx on file

## 2015-02-14 MED ORDER — DEXTROSE 5 % IV SOLN
3.0000 g | INTRAVENOUS | Status: AC
  Administered 2015-02-15: 3 g via INTRAVENOUS
  Filled 2015-02-14: qty 3000

## 2015-02-15 ENCOUNTER — Inpatient Hospital Stay (HOSPITAL_COMMUNITY)
Admission: RE | Admit: 2015-02-15 | Discharge: 2015-02-19 | DRG: 470 | Disposition: A | Discharge status: Skilled Nursing Facility | Payer: Medicare Other | Source: Ambulatory Visit | Attending: Orthopedic Surgery | Admitting: Orthopedic Surgery

## 2015-02-15 ENCOUNTER — Encounter (HOSPITAL_COMMUNITY)
Admission: RE | Disposition: A | Discharge status: Skilled Nursing Facility | Payer: Self-pay | Source: Ambulatory Visit | Attending: Orthopedic Surgery

## 2015-02-15 ENCOUNTER — Inpatient Hospital Stay (HOSPITAL_COMMUNITY): Payer: Medicare Other | Admitting: Certified Registered Nurse Anesthetist

## 2015-02-15 ENCOUNTER — Encounter (HOSPITAL_COMMUNITY): Payer: Self-pay | Admitting: *Deleted

## 2015-02-15 DIAGNOSIS — M659 Synovitis and tenosynovitis, unspecified: Secondary | ICD-10-CM | POA: Diagnosis present

## 2015-02-15 DIAGNOSIS — E119 Type 2 diabetes mellitus without complications: Secondary | ICD-10-CM | POA: Diagnosis present

## 2015-02-15 DIAGNOSIS — I482 Chronic atrial fibrillation: Secondary | ICD-10-CM | POA: Diagnosis not present

## 2015-02-15 DIAGNOSIS — R262 Difficulty in walking, not elsewhere classified: Secondary | ICD-10-CM | POA: Diagnosis not present

## 2015-02-15 DIAGNOSIS — I10 Essential (primary) hypertension: Secondary | ICD-10-CM | POA: Diagnosis present

## 2015-02-15 DIAGNOSIS — I4891 Unspecified atrial fibrillation: Secondary | ICD-10-CM | POA: Diagnosis present

## 2015-02-15 DIAGNOSIS — I428 Other cardiomyopathies: Secondary | ICD-10-CM | POA: Diagnosis present

## 2015-02-15 DIAGNOSIS — Z96659 Presence of unspecified artificial knee joint: Secondary | ICD-10-CM

## 2015-02-15 DIAGNOSIS — Z95810 Presence of automatic (implantable) cardiac defibrillator: Secondary | ICD-10-CM | POA: Diagnosis not present

## 2015-02-15 DIAGNOSIS — Z6839 Body mass index (BMI) 39.0-39.9, adult: Secondary | ICD-10-CM | POA: Diagnosis not present

## 2015-02-15 DIAGNOSIS — Z7901 Long term (current) use of anticoagulants: Secondary | ICD-10-CM

## 2015-02-15 DIAGNOSIS — Z01812 Encounter for preprocedural laboratory examination: Secondary | ICD-10-CM

## 2015-02-15 DIAGNOSIS — K219 Gastro-esophageal reflux disease without esophagitis: Secondary | ICD-10-CM | POA: Diagnosis present

## 2015-02-15 DIAGNOSIS — M179 Osteoarthritis of knee, unspecified: Secondary | ICD-10-CM | POA: Diagnosis not present

## 2015-02-15 DIAGNOSIS — Z96652 Presence of left artificial knee joint: Secondary | ICD-10-CM

## 2015-02-15 DIAGNOSIS — I509 Heart failure, unspecified: Secondary | ICD-10-CM

## 2015-02-15 DIAGNOSIS — M1712 Unilateral primary osteoarthritis, left knee: Principal | ICD-10-CM | POA: Diagnosis present

## 2015-02-15 DIAGNOSIS — I429 Cardiomyopathy, unspecified: Secondary | ICD-10-CM | POA: Diagnosis not present

## 2015-02-15 DIAGNOSIS — Z471 Aftercare following joint replacement surgery: Secondary | ICD-10-CM | POA: Diagnosis not present

## 2015-02-15 DIAGNOSIS — E876 Hypokalemia: Secondary | ICD-10-CM | POA: Diagnosis not present

## 2015-02-15 DIAGNOSIS — I5022 Chronic systolic (congestive) heart failure: Secondary | ICD-10-CM | POA: Diagnosis not present

## 2015-02-15 DIAGNOSIS — M25562 Pain in left knee: Secondary | ICD-10-CM | POA: Diagnosis not present

## 2015-02-15 DIAGNOSIS — E669 Obesity, unspecified: Secondary | ICD-10-CM | POA: Diagnosis present

## 2015-02-15 DIAGNOSIS — D649 Anemia, unspecified: Secondary | ICD-10-CM | POA: Diagnosis not present

## 2015-02-15 DIAGNOSIS — I252 Old myocardial infarction: Secondary | ICD-10-CM

## 2015-02-15 DIAGNOSIS — M6281 Muscle weakness (generalized): Secondary | ICD-10-CM | POA: Diagnosis not present

## 2015-02-15 HISTORY — PX: TOTAL KNEE ARTHROPLASTY: SHX125

## 2015-02-15 LAB — CBC
HCT: 37.2 % — ABNORMAL LOW (ref 39.0–52.0)
Hemoglobin: 12 g/dL — ABNORMAL LOW (ref 13.0–17.0)
MCH: 30.5 pg (ref 26.0–34.0)
MCHC: 32.3 g/dL (ref 30.0–36.0)
MCV: 94.7 fL (ref 78.0–100.0)
PLATELETS: 101 10*3/uL — AB (ref 150–400)
RBC: 3.93 MIL/uL — ABNORMAL LOW (ref 4.22–5.81)
RDW: 14.2 % (ref 11.5–15.5)
WBC: 8.3 10*3/uL (ref 4.0–10.5)

## 2015-02-15 LAB — CREATININE, SERUM
Creatinine, Ser: 0.86 mg/dL (ref 0.50–1.35)
GFR calc non Af Amer: 77 mL/min — ABNORMAL LOW (ref >90)
GFR, EST AFRICAN AMERICAN: 90 mL/min — AB (ref >90)

## 2015-02-15 LAB — GLUCOSE, CAPILLARY
Glucose-Capillary: 102 mg/dL — ABNORMAL HIGH (ref 70–99)
Glucose-Capillary: 230 mg/dL — ABNORMAL HIGH (ref 70–99)

## 2015-02-15 LAB — TYPE AND SCREEN
ABO/RH(D): O POS
Antibody Screen: NEGATIVE

## 2015-02-15 LAB — PROTIME-INR
INR: 1.24 (ref 0.00–1.49)
Prothrombin Time: 15.8 seconds — ABNORMAL HIGH (ref 11.6–15.2)

## 2015-02-15 SURGERY — ARTHROPLASTY, KNEE, TOTAL
Anesthesia: Monitor Anesthesia Care | Site: Knee | Laterality: Left

## 2015-02-15 MED ORDER — CELECOXIB 200 MG PO CAPS
200.0000 mg | ORAL_CAPSULE | Freq: Two times a day (BID) | ORAL | Status: DC
Start: 2015-02-15 — End: 2015-02-19
  Administered 2015-02-15 – 2015-02-19 (×8): 200 mg via ORAL
  Filled 2015-02-15 (×9): qty 1

## 2015-02-15 MED ORDER — DOCUSATE SODIUM 100 MG PO CAPS
100.0000 mg | ORAL_CAPSULE | Freq: Two times a day (BID) | ORAL | Status: DC
  Administered 2015-02-16 – 2015-02-18 (×6): 100 mg via ORAL

## 2015-02-15 MED ORDER — HYDROCODONE-ACETAMINOPHEN 7.5-325 MG PO TABS
1.0000 | ORAL_TABLET | ORAL | Status: DC
  Administered 2015-02-15: 2 via ORAL
  Administered 2015-02-15: 1 via ORAL
  Administered 2015-02-16 (×2): 2 via ORAL
  Administered 2015-02-16: 1 via ORAL
  Administered 2015-02-16: 2 via ORAL
  Administered 2015-02-16: 1 via ORAL
  Administered 2015-02-16: 2 via ORAL
  Administered 2015-02-17 (×3): 1 via ORAL
  Administered 2015-02-17: 2 via ORAL
  Administered 2015-02-17: 1 via ORAL
  Administered 2015-02-18 (×2): 2 via ORAL
  Administered 2015-02-18 – 2015-02-19 (×3): 1 via ORAL
  Administered 2015-02-19 (×2): 2 via ORAL
  Filled 2015-02-15: qty 2
  Filled 2015-02-15 (×4): qty 1
  Filled 2015-02-15: qty 2
  Filled 2015-02-15 (×2): qty 1
  Filled 2015-02-15 (×6): qty 2
  Filled 2015-02-15 (×3): qty 1
  Filled 2015-02-15: qty 2
  Filled 2015-02-15: qty 1
  Filled 2015-02-15: qty 2

## 2015-02-15 MED ORDER — BUPIVACAINE-EPINEPHRINE (PF) 0.25% -1:200000 IJ SOLN
INTRAMUSCULAR | Status: DC | PRN
  Administered 2015-02-15: 30 mL

## 2015-02-15 MED ORDER — METOLAZONE 2.5 MG PO TABS
2.5000 mg | ORAL_TABLET | Freq: Every day | ORAL | Status: DC
  Administered 2015-02-15 – 2015-02-19 (×5): 2.5 mg via ORAL
  Filled 2015-02-15 (×5): qty 1

## 2015-02-15 MED ORDER — ONDANSETRON HCL 4 MG/2ML IJ SOLN
INTRAMUSCULAR | Status: DC | PRN
  Administered 2015-02-15: 4 mg via INTRAVENOUS

## 2015-02-15 MED ORDER — WARFARIN SODIUM 7.5 MG PO TABS
7.5000 mg | ORAL_TABLET | Freq: Once | ORAL | Status: AC
  Administered 2015-02-15: 7.5 mg via ORAL
  Filled 2015-02-15: qty 1

## 2015-02-15 MED ORDER — POLYETHYLENE GLYCOL 3350 17 G PO PACK
17.0000 g | PACK | Freq: Two times a day (BID) | ORAL | Status: DC
  Administered 2015-02-16 – 2015-02-18 (×5): 17 g via ORAL

## 2015-02-15 MED ORDER — PHENYLEPHRINE HCL 10 MG/ML IJ SOLN
INTRAMUSCULAR | Status: AC
  Filled 2015-02-15: qty 1

## 2015-02-15 MED ORDER — ENOXAPARIN SODIUM 40 MG/0.4ML ~~LOC~~ SOLN
40.0000 mg | Freq: Two times a day (BID) | SUBCUTANEOUS | Status: DC
  Administered 2015-02-16 – 2015-02-19 (×7): 40 mg via SUBCUTANEOUS
  Filled 2015-02-15 (×10): qty 0.4

## 2015-02-15 MED ORDER — DEXAMETHASONE SODIUM PHOSPHATE 10 MG/ML IJ SOLN: 10.0000 mg | Freq: Once | INTRAMUSCULAR | Status: DC

## 2015-02-15 MED ORDER — DEXAMETHASONE SODIUM PHOSPHATE 10 MG/ML IJ SOLN
INTRAMUSCULAR | Status: AC
  Filled 2015-02-15: qty 1

## 2015-02-15 MED ORDER — BUPIVACAINE-EPINEPHRINE (PF) 0.25% -1:200000 IJ SOLN
INTRAMUSCULAR | Status: AC
  Filled 2015-02-15: qty 30

## 2015-02-15 MED ORDER — SODIUM CHLORIDE 0.9 % IR SOLN
Status: DC | PRN
  Administered 2015-02-15: 1000 mL

## 2015-02-15 MED ORDER — POTASSIUM CHLORIDE 2 MEQ/ML IV SOLN
INTRAVENOUS | Status: DC
  Administered 2015-02-15: 21:00:00 via INTRAVENOUS
  Filled 2015-02-15 (×10): qty 1000

## 2015-02-15 MED ORDER — FENTANYL CITRATE 0.05 MG/ML IJ SOLN: 25.0000 ug | INTRAMUSCULAR | Status: DC | PRN

## 2015-02-15 MED ORDER — PROPOFOL INFUSION 10 MG/ML OPTIME
INTRAVENOUS | Status: DC | PRN
  Administered 2015-02-15: 25 ug/kg/min via INTRAVENOUS

## 2015-02-15 MED ORDER — INSULIN ASPART 100 UNIT/ML ~~LOC~~ SOLN
0.0000 [IU] | Freq: Three times a day (TID) | SUBCUTANEOUS | Status: DC
  Administered 2015-02-16: 2 [IU] via SUBCUTANEOUS
  Administered 2015-02-16 – 2015-02-17 (×3): 3 [IU] via SUBCUTANEOUS
  Administered 2015-02-17: 2 [IU] via SUBCUTANEOUS
  Administered 2015-02-18: 3 [IU] via SUBCUTANEOUS
  Administered 2015-02-18: 2 [IU] via SUBCUTANEOUS
  Administered 2015-02-18 – 2015-02-19 (×2): 3 [IU] via SUBCUTANEOUS

## 2015-02-15 MED ORDER — TAMSULOSIN HCL 0.4 MG PO CAPS
0.4000 mg | ORAL_CAPSULE | Freq: Two times a day (BID) | ORAL | Status: DC
  Administered 2015-02-15 – 2015-02-19 (×8): 0.4 mg via ORAL
  Filled 2015-02-15 (×10): qty 1

## 2015-02-15 MED ORDER — LACTATED RINGERS IV SOLN
INTRAVENOUS | Status: DC
  Administered 2015-02-15: 1000 mL via INTRAVENOUS

## 2015-02-15 MED ORDER — ONDANSETRON HCL 4 MG/2ML IJ SOLN: 4.0000 mg | Freq: Four times a day (QID) | INTRAMUSCULAR | Status: DC | PRN

## 2015-02-15 MED ORDER — ACETAMINOPHEN 325 MG PO TABS: 325.0000 mg | ORAL_TABLET | ORAL | Status: DC | PRN

## 2015-02-15 MED ORDER — BISACODYL 10 MG RE SUPP: 10.0000 mg | Freq: Every day | RECTAL | Status: DC | PRN

## 2015-02-15 MED ORDER — MENTHOL 3 MG MT LOZG: 1.0000 | LOZENGE | OROMUCOSAL | Status: DC | PRN

## 2015-02-15 MED ORDER — MIDAZOLAM HCL 2 MG/2ML IJ SOLN
INTRAMUSCULAR | Status: AC
  Filled 2015-02-15: qty 2

## 2015-02-15 MED ORDER — MIDAZOLAM HCL 5 MG/5ML IJ SOLN
INTRAMUSCULAR | Status: DC | PRN
  Administered 2015-02-15: 1 mg via INTRAVENOUS

## 2015-02-15 MED ORDER — MAGNESIUM CITRATE PO SOLN: 1.0000 | Freq: Once | ORAL | Status: AC | PRN

## 2015-02-15 MED ORDER — POTASSIUM CHLORIDE CRYS ER 20 MEQ PO TBCR
100.0000 meq | EXTENDED_RELEASE_TABLET | Freq: Two times a day (BID) | ORAL | Status: DC
  Administered 2015-02-16 – 2015-02-19 (×6): 100 meq via ORAL
  Filled 2015-02-15 (×8): qty 5

## 2015-02-15 MED ORDER — BUPIVACAINE IN DEXTROSE 0.75-8.25 % IT SOLN
INTRATHECAL | Status: DC | PRN
  Administered 2015-02-15: 2 mL via INTRATHECAL

## 2015-02-15 MED ORDER — ATORVASTATIN CALCIUM 20 MG PO TABS
20.0000 mg | ORAL_TABLET | Freq: Every day | ORAL | Status: DC
  Administered 2015-02-15 – 2015-02-17 (×3): 20 mg via ORAL
  Filled 2015-02-15 (×5): qty 1

## 2015-02-15 MED ORDER — KETOROLAC TROMETHAMINE 30 MG/ML IJ SOLN
INTRAMUSCULAR | Status: DC | PRN
  Administered 2015-02-15: 30 mg

## 2015-02-15 MED ORDER — PROPOFOL 10 MG/ML IV BOLUS
INTRAVENOUS | Status: AC
  Filled 2015-02-15: qty 20

## 2015-02-15 MED ORDER — ALUM & MAG HYDROXIDE-SIMETH 200-200-20 MG/5ML PO SUSP: 30.0000 mL | ORAL | Status: DC | PRN

## 2015-02-15 MED ORDER — METOCLOPRAMIDE HCL 5 MG/ML IJ SOLN: 5.0000 mg | Freq: Three times a day (TID) | INTRAMUSCULAR | Status: DC | PRN

## 2015-02-15 MED ORDER — OXYCODONE HCL 5 MG PO TABS: 5.0000 mg | ORAL_TABLET | Freq: Once | ORAL | Status: DC | PRN

## 2015-02-15 MED ORDER — HYDROMORPHONE HCL 1 MG/ML IJ SOLN
0.5000 mg | INTRAMUSCULAR | Status: DC | PRN
  Administered 2015-02-15: 1 mg via INTRAVENOUS
  Filled 2015-02-15: qty 1

## 2015-02-15 MED ORDER — DEXAMETHASONE SODIUM PHOSPHATE 10 MG/ML IJ SOLN
10.0000 mg | Freq: Once | INTRAMUSCULAR | Status: AC
  Administered 2015-02-16: 10 mg via INTRAVENOUS
  Filled 2015-02-15: qty 1

## 2015-02-15 MED ORDER — FENTANYL CITRATE 0.05 MG/ML IJ SOLN
INTRAMUSCULAR | Status: AC
  Filled 2015-02-15: qty 2

## 2015-02-15 MED ORDER — PHENOL 1.4 % MT LIQD: 1.0000 | OROMUCOSAL | Status: DC | PRN

## 2015-02-15 MED ORDER — ACETAMINOPHEN 160 MG/5ML PO SOLN
325.0000 mg | ORAL | Status: DC | PRN
  Filled 2015-02-15: qty 20.3

## 2015-02-15 MED ORDER — ONDANSETRON HCL 4 MG PO TABS: 4.0000 mg | ORAL_TABLET | Freq: Four times a day (QID) | ORAL | Status: DC | PRN

## 2015-02-15 MED ORDER — ONDANSETRON HCL 4 MG/2ML IJ SOLN
INTRAMUSCULAR | Status: AC
  Filled 2015-02-15: qty 2

## 2015-02-15 MED ORDER — METHOCARBAMOL 500 MG PO TABS
500.0000 mg | ORAL_TABLET | Freq: Four times a day (QID) | ORAL | Status: DC | PRN
Start: 2015-02-15 — End: 2015-02-19
  Administered 2015-02-17 – 2015-02-18 (×2): 500 mg via ORAL
  Filled 2015-02-15 (×2): qty 1

## 2015-02-15 MED ORDER — PHENYLEPHRINE HCL 10 MG/ML IJ SOLN
10.0000 mg | INTRAVENOUS | Status: DC | PRN
  Administered 2015-02-15: 25 ug/min via INTRAVENOUS

## 2015-02-15 MED ORDER — CARVEDILOL 12.5 MG PO TABS
12.5000 mg | ORAL_TABLET | Freq: Two times a day (BID) | ORAL | Status: DC
  Administered 2015-02-16 – 2015-02-19 (×7): 12.5 mg via ORAL
  Filled 2015-02-15 (×9): qty 1

## 2015-02-15 MED ORDER — FUROSEMIDE 80 MG PO TABS
80.0000 mg | ORAL_TABLET | Freq: Two times a day (BID) | ORAL | Status: DC
  Administered 2015-02-15 – 2015-02-17 (×4): 80 mg via ORAL
  Filled 2015-02-15 (×7): qty 1

## 2015-02-15 MED ORDER — WARFARIN - PHARMACIST DOSING INPATIENT: Freq: Every day | Status: DC

## 2015-02-15 MED ORDER — ALLOPURINOL 100 MG PO TABS
100.0000 mg | ORAL_TABLET | Freq: Every day | ORAL | Status: DC
  Administered 2015-02-16 – 2015-02-19 (×4): 100 mg via ORAL
  Filled 2015-02-15 (×4): qty 1

## 2015-02-15 MED ORDER — METHOCARBAMOL 1000 MG/10ML IJ SOLN
500.0000 mg | Freq: Four times a day (QID) | INTRAVENOUS | Status: DC | PRN
  Administered 2015-02-15: 500 mg via INTRAVENOUS
  Filled 2015-02-15 (×2): qty 5

## 2015-02-15 MED ORDER — DEXAMETHASONE SODIUM PHOSPHATE 4 MG/ML IJ SOLN
INTRAMUSCULAR | Status: DC | PRN
  Administered 2015-02-15: 8 mg via INTRAVENOUS

## 2015-02-15 MED ORDER — FENTANYL CITRATE 0.05 MG/ML IJ SOLN
INTRAMUSCULAR | Status: DC | PRN
  Administered 2015-02-15: 25 ug via INTRAVENOUS

## 2015-02-15 MED ORDER — FERROUS SULFATE 325 (65 FE) MG PO TABS
325.0000 mg | ORAL_TABLET | Freq: Three times a day (TID) | ORAL | Status: DC
  Administered 2015-02-16 – 2015-02-19 (×9): 325 mg via ORAL
  Filled 2015-02-15 (×13): qty 1

## 2015-02-15 MED ORDER — TRANEXAMIC ACID 100 MG/ML IV SOLN
2000.0000 mg | Freq: Once | INTRAVENOUS | Status: DC
  Filled 2015-02-15: qty 20

## 2015-02-15 MED ORDER — CEFAZOLIN SODIUM-DEXTROSE 2-3 GM-% IV SOLR
2.0000 g | Freq: Four times a day (QID) | INTRAVENOUS | Status: AC
  Administered 2015-02-15 – 2015-02-16 (×2): 2 g via INTRAVENOUS
  Filled 2015-02-15 (×2): qty 50

## 2015-02-15 MED ORDER — SODIUM CHLORIDE 0.9 % IJ SOLN
INTRAMUSCULAR | Status: DC | PRN
  Administered 2015-02-15: 29 mL

## 2015-02-15 MED ORDER — OXYCODONE HCL 5 MG/5ML PO SOLN
5.0000 mg | Freq: Once | ORAL | Status: DC | PRN
  Filled 2015-02-15: qty 5

## 2015-02-15 MED ORDER — DIPHENHYDRAMINE HCL 25 MG PO CAPS: 25.0000 mg | ORAL_CAPSULE | Freq: Four times a day (QID) | ORAL | Status: DC | PRN

## 2015-02-15 MED ORDER — SODIUM CHLORIDE 0.9 % IJ SOLN
INTRAMUSCULAR | Status: AC
  Filled 2015-02-15: qty 50

## 2015-02-15 MED ORDER — KETOROLAC TROMETHAMINE 30 MG/ML IJ SOLN
INTRAMUSCULAR | Status: AC
  Filled 2015-02-15: qty 1

## 2015-02-15 MED ORDER — METOCLOPRAMIDE HCL 10 MG PO TABS: 5.0000 mg | ORAL_TABLET | Freq: Three times a day (TID) | ORAL | Status: DC | PRN

## 2015-02-15 MED ORDER — PRAVASTATIN SODIUM 40 MG PO TABS
40.0000 mg | ORAL_TABLET | Freq: Every day | ORAL | Status: DC
  Administered 2015-02-16 – 2015-02-18 (×3): 40 mg via ORAL
  Filled 2015-02-15 (×3): qty 1

## 2015-02-15 SURGICAL SUPPLY — 56 items
BAG DECANTER FOR FLEXI CONT (MISCELLANEOUS) IMPLANT
BAG SPEC THK2 15X12 ZIP CLS (MISCELLANEOUS)
BAG ZIPLOCK 12X15 (MISCELLANEOUS) IMPLANT
BANDAGE ELASTIC 6 VELCRO ST LF (GAUZE/BANDAGES/DRESSINGS) ×3 IMPLANT
BANDAGE ESMARK 6X9 LF (GAUZE/BANDAGES/DRESSINGS) ×1 IMPLANT
BLADE SAW SGTL 13.0X1.19X90.0M (BLADE) ×3 IMPLANT
BNDG CMPR 9X6 STRL LF SNTH (GAUZE/BANDAGES/DRESSINGS) ×1
BNDG ESMARK 6X9 LF (GAUZE/BANDAGES/DRESSINGS) ×3
BOWL SMART MIX CTS (DISPOSABLE) ×3 IMPLANT
CAP KNEE TOTAL 3 SIGMA ×2 IMPLANT
CEMENT HV SMART SET (Cement) ×4 IMPLANT
CUFF TOURN SGL QUICK 34 (TOURNIQUET CUFF) ×3
CUFF TRNQT CYL 34X4X40X1 (TOURNIQUET CUFF) ×1 IMPLANT
DECANTER SPIKE VIAL GLASS SM (MISCELLANEOUS) ×3 IMPLANT
DRAPE EXTREMITY T 121X128X90 (DRAPE) ×3 IMPLANT
DRAPE POUCH INSTRU U-SHP 10X18 (DRAPES) ×3 IMPLANT
DRAPE U-SHAPE 47X51 STRL (DRAPES) ×3 IMPLANT
DRSG AQUACEL AG ADV 3.5X10 (GAUZE/BANDAGES/DRESSINGS) ×3 IMPLANT
DURAPREP 26ML APPLICATOR (WOUND CARE) ×6 IMPLANT
ELECT REM PT RETURN 9FT ADLT (ELECTROSURGICAL) ×3
ELECTRODE REM PT RTRN 9FT ADLT (ELECTROSURGICAL) ×1 IMPLANT
FACESHIELD WRAPAROUND (MASK) ×15 IMPLANT
FACESHIELD WRAPAROUND OR TEAM (MASK) ×5 IMPLANT
GLOVE BIOGEL PI IND STRL 7.5 (GLOVE) ×1 IMPLANT
GLOVE BIOGEL PI IND STRL 8.5 (GLOVE) ×1 IMPLANT
GLOVE BIOGEL PI INDICATOR 7.5 (GLOVE) ×2
GLOVE BIOGEL PI INDICATOR 8.5 (GLOVE) ×2
GLOVE ECLIPSE 8.0 STRL XLNG CF (GLOVE) ×3 IMPLANT
GLOVE ORTHO TXT STRL SZ7.5 (GLOVE) ×6 IMPLANT
GOWN SPEC L3 XXLG W/TWL (GOWN DISPOSABLE) ×3 IMPLANT
GOWN STRL REUS W/TWL LRG LVL3 (GOWN DISPOSABLE) ×3 IMPLANT
HANDPIECE INTERPULSE COAX TIP (DISPOSABLE) ×3
KIT BASIN OR (CUSTOM PROCEDURE TRAY) ×3 IMPLANT
LIQUID BAND (GAUZE/BANDAGES/DRESSINGS) ×3 IMPLANT
MANIFOLD NEPTUNE II (INSTRUMENTS) ×3 IMPLANT
NDL SAFETY ECLIPSE 18X1.5 (NEEDLE) ×1 IMPLANT
NEEDLE HYPO 18GX1.5 SHARP (NEEDLE) ×3
PACK TOTAL JOINT (CUSTOM PROCEDURE TRAY) ×3 IMPLANT
PEN SKIN MARKING BROAD (MISCELLANEOUS) ×3 IMPLANT
POSITIONER SURGICAL ARM (MISCELLANEOUS) ×3 IMPLANT
SET HNDPC FAN SPRY TIP SCT (DISPOSABLE) ×1 IMPLANT
SET PAD KNEE POSITIONER (MISCELLANEOUS) ×3 IMPLANT
SUCTION FRAZIER 12FR DISP (SUCTIONS) ×3 IMPLANT
SUT MNCRL AB 3-0 PS2 18 (SUTURE) ×2 IMPLANT
SUT MNCRL AB 4-0 PS2 18 (SUTURE) ×3 IMPLANT
SUT VIC AB 1 CT1 36 (SUTURE) ×3 IMPLANT
SUT VIC AB 2-0 CT1 27 (SUTURE) ×9
SUT VIC AB 2-0 CT1 TAPERPNT 27 (SUTURE) ×3 IMPLANT
SUT VLOC 180 0 24IN GS25 (SUTURE) ×3 IMPLANT
SYR 50ML LL SCALE MARK (SYRINGE) ×3 IMPLANT
TOWEL OR 17X26 10 PK STRL BLUE (TOWEL DISPOSABLE) ×3 IMPLANT
TOWEL OR NON WOVEN STRL DISP B (DISPOSABLE) IMPLANT
TRAY FOLEY CATH 14FRSI W/METER (CATHETERS) ×1 IMPLANT
WATER STERILE IRR 1500ML POUR (IV SOLUTION) ×3 IMPLANT
WRAP KNEE MAXI GEL POST OP (GAUZE/BANDAGES/DRESSINGS) ×3 IMPLANT
YANKAUER SUCT BULB TIP 10FT TU (MISCELLANEOUS) ×3 IMPLANT

## 2015-02-15 NOTE — Progress Notes (Signed)
ANTICOAGULATION CONSULT NOTE - Initial Consult  Pharmacy Consult for Warfarin Indication: atrial fibrillation  No Known Allergies  Patient Measurements: Height: 5' 8.5" (174 cm) Weight: 266 lb (120.657 kg) IBW/kg (Calculated) : 69.55  Vital Signs: Temp: 97.6 F (36.4 C) (03/08 1854) Temp Source: Oral (03/08 1324) BP: 120/83 mmHg (03/08 1854) Pulse Rate: 71 (03/08 1854)  Labs:  Recent Labs  02/15/15 1355  LABPROT 15.8*  INR 1.24    Estimated Creatinine Clearance: 81.4 mL/min (by C-G formula based on Cr of 0.86).   Medical History: Past Medical History  Diagnosis Date  . CHF (congestive heart failure)   . HTN (hypertension)   . OA (osteoarthritis)   . BPH (benign prostatic hyperplasia)   . Obesity   . Anemia   . Diabetes mellitus type II   . Atrial fibrillation   . Cardiomyopathy   . Hyperlipidemia   . Peptic ulcer   . Bradycardia   . History of colonoscopy   . ICD (implantable cardiac defibrillator) in place 10/14/2012    biventricular  . GERD (gastroesophageal reflux disease)     Medications:  Scheduled:  . [START ON 02/16/2015] allopurinol  100 mg Oral Daily  . atorvastatin  20 mg Oral Daily  . [START ON 02/16/2015] carvedilol  12.5 mg Oral BID WC  .  ceFAZolin (ANCEF) IV  2 g Intravenous Q6H  . celecoxib  200 mg Oral Q12H  . [START ON 02/16/2015] dexamethasone  10 mg Intravenous Once  . docusate sodium  100 mg Oral BID  . [START ON 02/16/2015] enoxaparin (LOVENOX) injection  40 mg Subcutaneous Q12H  . ferrous sulfate  325 mg Oral TID PC  . furosemide  80 mg Oral BID  . HYDROcodone-acetaminophen  1-2 tablet Oral Q4H  . [START ON 02/16/2015] insulin aspart  0-15 Units Subcutaneous TID WC  . metolazone  2.5 mg Oral Daily  . polyethylene glycol  17 g Oral BID  . [START ON 02/16/2015] potassium chloride SA  100 mEq Oral BID  . pravastatin  40 mg Oral Daily  . tamsulosin  0.4 mg Oral BID   Infusions:  . sodium chloride 0.9 % 1,000 mL with potassium chloride 10  mEq infusion      Assessment:  81 yr male on warfarin PTA for h/o AFib arrived today for left TKA  Home regimen of warfarin = 5mg  daily except 2.5mg  on Thursday (per clinic notes).  Last dose of warfarin was taken on 3/3.  INR checked on 3/1 was therapeutic (2.35). Patient underwent bridging with Lovenox up until surgery.  INR today = 1.24  Pharmacy asked to resume warfarin dosing  MD bridging with Lovenox 40mg  BID post-op   Goal of Therapy:  INR 2-3   Plan:  Warfarin 7.5mg  po x 1 tonight  (1.5x home dose per protocol since off warfarin since 3/3) Lovenox 40mg  sq q12h per MD dosing to begin on 3/9 (to be d/c'ed once INR > 1.8) Check daily PT/INR  Cynde Menard, Joselyn Glassman, PharmD 02/15/2015,7:40 PM

## 2015-02-15 NOTE — Interval H&P Note (Signed)
History and Physical Interval Note:  02/15/2015 2:34 PM  [name]  has presented today for surgery, with the diagnosis of left knee osteoarthritis  The various methods of treatment have been discussed with the patient and family. After consideration of risks, benefits and other options for treatment, the patient has consented to  Procedure(s): LEFT TOTAL KNEE ARTHROPLASTY (Left) as a surgical intervention .  The patient's history has been reviewed, patient examined, no change in status, stable for surgery.  I have reviewed the patient's chart and labs.  Questions were answered to the patient's satisfaction.     Shelda Pal

## 2015-02-15 NOTE — Anesthesia Postprocedure Evaluation (Signed)
  Anesthesia Post-op Note  Patient: [name]  Procedure(s) Performed: Procedure(s): LEFT TOTAL KNEE ARTHROPLASTY (Left)  Patient Location: PACU  Anesthesia Type:Spinal  Level of Consciousness: awake  Airway and Oxygen Therapy: Patient Spontanous Breathing  Post-op Pain: none  Post-op Assessment: Post-op Vital signs reviewed, Patient's Cardiovascular Status Stable, Respiratory Function Stable, Patent Airway, No signs of Nausea or vomiting and Pain level controlled  Post-op Vital Signs: Reviewed and stable  Last Vitals:  Filed Vitals:   02/15/15 1830  BP: 126/81  Pulse: 69  Temp:   Resp: 14    Complications: No apparent anesthesia complications

## 2015-02-15 NOTE — Anesthesia Procedure Notes (Signed)
Spinal Patient location during procedure: OR Start time: 02/15/2015 4:21 PM End time: 02/15/2015 4:28 PM Staffing Resident/CRNA: Vonnie Spagnolo A Performed by: resident/CRNA  Preanesthetic Checklist Completed: patient identified, site marked, surgical consent, pre-op evaluation, timeout performed, IV checked, risks and benefits discussed and monitors and equipment checked Spinal Block Patient position: sitting Prep: ChloraPrep Patient monitoring: heart rate, continuous pulse ox and blood pressure Approach: midline Location: L2-3 Injection technique: single-shot Needle Needle type: Spinocan  Needle gauge: 22 G Needle length: 9 cm Assessment Sensory level: T4 Additional Notes Pt tolerated well. + CSF, - heme. Spinal expiration kit 2017-07

## 2015-02-15 NOTE — Op Note (Signed)
NAME:  [name]                      MEDICAL RECORD NO.:  [id number]                             FACILITY:  [facility]      PHYSICIAN:  Madlyn Frankel. Charlann Boxer, M.D.  DATE OF BIRTH:  1929-05-11      DATE OF PROCEDURE:  02/15/2015                                     OPERATIVE REPORT         PREOPERATIVE DIAGNOSIS:  Left knee osteoarthritis.      POSTOPERATIVE DIAGNOSIS:  Left knee osteoarthritis.      FINDINGS:  The patient was noted to have complete loss of cartilage and   bone-on-bone arthritis with associated osteophytes in all three compartments of   the knee with a significant synovitis and associated effusion as well as joint subluxation as a result of advanced DJD and ligament laxity.     PROCEDURE:  Left total knee replacement.      COMPONENTS USED:  DeP[employer] Sigma rotating platform posterior stabilized knee   system, a size 5 femur, 5 tibia, 15 mm PS insert, and 41 patellar   button.      SURGEON:  Madlyn Frankel. Charlann Boxer, M.D.      ASSISTANT:  Lanney Gins, PA-C.      ANESTHESIA:  Spinal.      SPECIMENS:  None.      COMPLICATION:  None.      DRAINS:  None.  EBL: <100cc      TOURNIQUET TIME:   at     The patient was stable to the recovery room.      INDICATION FOR PROCEDURE:  [name] is a 81 y.o. male patient of   mine.  The patient had been seen, evaluated, and treated conservatively in the   office with medication, activity modification, and injections.  The patient had   radiographic changes of bone-on-bone arthritis with endplate sclerosis and osteophytes noted.      The patient failed conservative measures including medication, injections, and activity modification, and at this point was ready for more definitive measures.   Based on the radiographic changes and failed conservative measures, the patient   decided to proceed with total knee replacement.  Risks of infection,   DVT, component failure, need for revision surgery, postop course,  and   expectations were all   discussed and reviewed.  Consent was obtained for benefit of pain   relief.      PROCEDURE IN DETAIL:  The patient was brought to the operative theater.   Once adequate anesthesia, preoperative antibiotics, 2 gm of Ancef,  od Decadron administered, the patient was positioned supine with the left thigh tourniquet placed.  The  left lower extremity was prepped and draped in sterile fashion.  A time-   out was performed identifying the patient, planned procedure, and   extremity.      The left lower extremity was placed in the [facility] leg holder.  The leg was   exsanguinated, tourniquet elevated to 250 mmHg.  A midline incision was   made followed by median parapatellar arthrotomy.  Following initial   exposure, attention was first  directed to the patella.  Precut   measurement was noted to be 25 mm.  I resected down to 14-15 mm and used a   41 patellar button to restore patellar height as well as cover the cut   surface.      The lug holes were drilled and a metal shim was placed to protect the   patella from retractors and saw blades.      At this point, attention was now directed to the femur.  The femoral   canal was opened with a drill, irrigated to try to prevent fat emboli.  An   intramedullary rod was passed at 5 degrees valgus, 10 mm of bone was   resected off the distal femur.  Following this resection, the tibia was   subluxated anteriorly.  Using the extramedullary guide, 2 mm of bone was resected off   the proximal medial tibia.  We confirmed the gap would be   stable medially and laterally with a 10 mm insert as well as confirmed   the cut was perpendicular in the coronal plane, checking with an alignment rod.      Once this was done, I sized the femur to be a size 5 in the anterior-   posterior dimension, chose a standard component based on medial and   lateral dimension.  The size 5 rotation block was then pinned in   position anterior  referenced using the C-clamp to set rotation.  The   anterior, posterior, and  chamfer cuts were made without difficulty nor   notching making certain that I was along the anterior cortex to help   with flexion gap stability.      The final box cut was made off the lateral aspect of distal femur.  Once this was done I use laminar spreader to elevate femur to remove significant posterior osteophytes, medially and laterally.     At this point, the tibia was sized to be a size 5, the size 5 tray was   then pinned in position through the medial third of the tubercle,   drilled, and keel punched.  Trial reduction was now carried with a 5 femur,  5 tibia, a 12.5 then 15 mm PS insert, and the 41 patella botton.  The knee was brought to   extension, full extension with good flexion stability with the patella   tracking through the trochlea without application of pressure.  Given   all these findings, the trial components removed.  Final components were   opened and cement was mixed.  The knee was irrigated with normal saline   solution and pulse lavage.  The synovial lining was   then injected with 30cc of 0.25% Marcaine with epinephrine, 30cc of NS and 1 cc of Toradol,   total of 61 cc.      The knee was irrigated.  Final implants were then cemented onto clean and   dried cut surfaces of bone with the knee brought to extension with a 15 mm trial insert.      Once the cement had fully cured, the excess cement was removed   throughout the knee.  I confirmed I was satisfied with the range of   motion and stability, and the final 15 mm PS insert was chosen.  It was   placed into the knee.      The tourniquet had been let down at 36 minutes.  No significant   hemostasis required.  The  extensor mechanism was then reapproximated using #1 Vicryl and #0 V-lock sutures with the knee   in flexion.  Following closure of the capsular layer I injected 2gm of topically based Tranexamic Acid.  The    remaining wound was closed with 2-0 Vicryl and running 4-0 Monocryl.   The knee was cleaned, dried, dressed sterilely using Dermabond and   Aquacel dressing.  The patient was then   brought to recovery room in stable condition, tolerating the procedure   well.   Please note that Physician Assistant, Lanney Gins, PA-C, was present for the entirety of the case, and was utilized for pre-operative positioning, peri-operative retractor management, general facilitation of the procedure.  He was also utilized for primary wound closure at the end of the case.              Madlyn Frankel Charlann Boxer, M.D.    02/15/2015 3:50 PM

## 2015-02-15 NOTE — Transfer of Care (Signed)
Immediate Anesthesia Transfer of Care Note  Patient: [name]  Procedure(s) Performed: Procedure(s): LEFT TOTAL KNEE ARTHROPLASTY (Left)  Patient Location: PACU  Anesthesia Type:MAC and Spinal  Level of Consciousness: awake, alert , oriented and patient cooperative  Airway & Oxygen Therapy: Patient Spontanous Breathing and Patient connected to face mask oxygen  Post-op Assessment: Report given to RN and Post -op Vital signs reviewed and stable  Post vital signs: Reviewed and stable  Last Vitals:  Filed Vitals:   02/15/15 1324  BP: 139/72  Pulse: 74  Temp: 36.4 C  Resp: 20    Complications: No apparent anesthesia complications

## 2015-02-15 NOTE — Anesthesia Preprocedure Evaluation (Addendum)
Anesthesia Evaluation  Patient identified by MRN, date of birth, ID band Patient awake    Reviewed: Allergy & Precautions, NPO status , Patient's Chart, lab work & pertinent test results  History of Anesthesia Complications Negative for: history of anesthetic complications  Airway Mallampati: II  TM Distance: >3 FB Neck ROM: Full    Dental  (+) Teeth Intact   Pulmonary neg pulmonary ROS,  breath sounds clear to auscultation        Cardiovascular hypertension, Pt. on medications and Pt. on home beta blockers + Past MI and +CHF + dysrhythmias Atrial Fibrillation + Cardiac Defibrillator Rhythm:Irregular     Neuro/Psych negative neurological ROS  negative psych ROS   GI/Hepatic Neg liver ROS, GERD-  Medicated and Controlled,  Endo/Other  diabetes, Type 2  Renal/GU negative Renal ROS     Musculoskeletal  (+) Arthritis -,   Abdominal   Peds  Hematology negative hematology ROS (+)   Anesthesia Other Findings   Reproductive/Obstetrics                           Anesthesia Physical Anesthesia Plan  ASA: III  Anesthesia Plan: MAC and Spinal   Post-op Pain Management:    Induction: Intravenous  Airway Management Planned: Natural Airway and Simple Face Mask  Additional Equipment:   Intra-op Plan:   Post-operative Plan:   Informed Consent: I have reviewed the patients History and Physical, chart, labs and discussed the procedure including the risks, benefits and alternatives for the proposed anesthesia with the patient or authorized representative who has indicated his/her understanding and acceptance.   Dental advisory given  Plan Discussed with: CRNA and Surgeon  Anesthesia Plan Comments:        Anesthesia Quick Evaluation

## 2015-02-16 ENCOUNTER — Encounter (HOSPITAL_COMMUNITY): Payer: Self-pay | Admitting: Orthopedic Surgery

## 2015-02-16 LAB — GLUCOSE, CAPILLARY
GLUCOSE-CAPILLARY: 169 mg/dL — AB (ref 70–99)
Glucose-Capillary: 149 mg/dL — ABNORMAL HIGH (ref 70–99)
Glucose-Capillary: 177 mg/dL — ABNORMAL HIGH (ref 70–99)

## 2015-02-16 LAB — BASIC METABOLIC PANEL
Anion gap: 10 (ref 5–15)
BUN: 27 mg/dL — ABNORMAL HIGH (ref 6–23)
CO2: 26 mmol/L (ref 19–32)
Calcium: 8.2 mg/dL — ABNORMAL LOW (ref 8.4–10.5)
Chloride: 99 mmol/L (ref 96–112)
Creatinine, Ser: 0.96 mg/dL (ref 0.50–1.35)
GFR, EST AFRICAN AMERICAN: 86 mL/min — AB (ref >90)
GFR, EST NON AFRICAN AMERICAN: 74 mL/min — AB (ref >90)
GLUCOSE: 194 mg/dL — AB (ref 70–99)
Potassium: 3.1 mmol/L — ABNORMAL LOW (ref 3.5–5.1)
SODIUM: 135 mmol/L (ref 135–145)

## 2015-02-16 LAB — PROTIME-INR
INR: 1.23 (ref 0.00–1.49)
PROTHROMBIN TIME: 15.6 s — AB (ref 11.6–15.2)

## 2015-02-16 LAB — CBC
HEMATOCRIT: 35 % — AB (ref 39.0–52.0)
HEMOGLOBIN: 11.7 g/dL — AB (ref 13.0–17.0)
MCH: 31.5 pg (ref 26.0–34.0)
MCHC: 33.4 g/dL (ref 30.0–36.0)
MCV: 94.1 fL (ref 78.0–100.0)
Platelets: 105 10*3/uL — ABNORMAL LOW (ref 150–400)
RBC: 3.72 MIL/uL — ABNORMAL LOW (ref 4.22–5.81)
RDW: 14 % (ref 11.5–15.5)
WBC: 8.9 10*3/uL (ref 4.0–10.5)

## 2015-02-16 MED ORDER — WARFARIN SODIUM 6 MG PO TABS
6.0000 mg | ORAL_TABLET | Freq: Once | ORAL | Status: AC
Start: 2015-02-16 — End: 2015-02-16
  Administered 2015-02-16: 6 mg via ORAL
  Filled 2015-02-16: qty 1

## 2015-02-16 NOTE — Progress Notes (Signed)
Clinical Social Work Department BRIEF PSYCHOSOCIAL ASSESSMENT 02/16/2015  Patient:  [name],[name]     Account Number:  [id number]     Admit date:  02/15/2015  Clinical Social Worker:  ,, LCSW  Date/Time:  02/16/2015 08:00 AM  Referred by:  Physician  Date Referred:  02/15/2015 Referred for  SNF Placement   Other Referral:   Interview type:  Patient Other interview type:    PSYCHOSOCIAL DATA Living Status:  ALONE Admitted from facility:   Level of care:   Primary support name:  Debbie Primary support relationship to patient:  CHILD, ADULT Degree of support available:   supportive    CURRENT CONCERNS Current Concerns  Post-Acute Placement   Other Concerns:    SOCIAL WORK ASSESSMENT / PLAN Pt is an 81 yr old male living at home prior to hospitalization. CSW met with pt and spoke to daughter to assist with d/Kim planning. This is a planned admission. MD feels pt will need ST Rehab following hospital d/Kim. Pt / daughter are in agreement with this plan. SNF search has been initiated and bed offers are pending. CSW will continue to follow to assist with d/Kim planning to SNF.   Assessment/plan status:  Psychosocial Support/Ongoing Assessment of Needs Other assessment/ plan:   Information/referral to community resources:   Insurance coverage for SNF and ambulance transport reviewed.    PATIENT'S/FAMILY'S RESPONSE TO PLAN OF CARE: " I had a good night. I got some sleep. " Pt's mood is bright and he is motivated to begin rehab. " I've already been up sitting on the edge of the bed. I'm making progress. " Pt thought rehab following d/Kim was a good plan but asked csw to contact his daughter for assistance with planning.      LCSW [phone]    

## 2015-02-16 NOTE — Plan of Care (Signed)
Problem: Consults Goal: Diagnosis- Total Joint Replacement Outcome: Completed/Met Date Met:  02/16/15 02/15/2015: L TKA

## 2015-02-16 NOTE — Progress Notes (Signed)
ANTICOAGULATION CONSULT NOTE   Pharmacy Consult for Warfarin Indication: atrial fibrillation  No Known Allergies  Patient Measurements: Height: 5' 8.5" (174 cm) Weight: 266 lb (120.657 kg) IBW/kg (Calculated) : 69.55  Vital Signs: Temp: 97.3 F (36.3 C) (03/09 0957) Temp Source: Oral (03/09 0957) BP: 117/58 mmHg (03/09 0957) Pulse Rate: 66 (03/09 0957)  Labs:  Recent Labs  02/15/15 1355 02/15/15 2110 02/16/15 0506  HGB  --  12.0* 11.7*  HCT  --  37.2* 35.0*  PLT  --  101* 105*  LABPROT 15.8*  --  15.6*  INR 1.24  --  1.23  CREATININE  --  0.86 0.96    Estimated Creatinine Clearance: 72.9 mL/min (by C-G formula based on Cr of 0.96).   Medical History: Past Medical History  Diagnosis Date  . CHF (congestive heart failure)   . HTN (hypertension)   . OA (osteoarthritis)   . BPH (benign prostatic hyperplasia)   . Obesity   . Anemia   . Diabetes mellitus type II   . Atrial fibrillation   . Cardiomyopathy   . Hyperlipidemia   . Peptic ulcer   . Bradycardia   . History of colonoscopy   . ICD (implantable cardiac defibrillator) in place 10/14/2012    biventricular  . GERD (gastroesophageal reflux disease)     Medications:  Scheduled:  . allopurinol  100 mg Oral Daily  . atorvastatin  20 mg Oral Daily  . carvedilol  12.5 mg Oral BID WC  . celecoxib  200 mg Oral Q12H  . dexamethasone  10 mg Intravenous Once  . docusate sodium  100 mg Oral BID  . enoxaparin (LOVENOX) injection  40 mg Subcutaneous Q12H  . ferrous sulfate  325 mg Oral TID PC  . furosemide  80 mg Oral BID  . HYDROcodone-acetaminophen  1-2 tablet Oral Q4H  . insulin aspart  0-15 Units Subcutaneous TID WC  . metolazone  2.5 mg Oral Daily  . polyethylene glycol  17 g Oral BID  . potassium chloride SA  100 mEq Oral BID  . pravastatin  40 mg Oral Daily  . tamsulosin  0.4 mg Oral BID  . Warfarin - Pharmacist Dosing Inpatient   Does not apply q1800   Infusions:  . sodium chloride 0.9 % 1,000  mL with potassium chloride 10 mEq infusion 100 mL/hr at 02/15/15 2122    Assessment:  81 yr male on warfarin PTA for h/o AFib underwent left TKA 02/15/15  Home regimen of warfarin =  daily except 2.5mg  on Thursday (per clinic notes).  Last dose of warfarin was taken on 3/3.  INR checked on 3/1 was therapeutic (2.35). Patient underwent bridging with Lovenox up until surgery.  Pharmacy asked to resume warfarin dosing  MD bridging with Lovenox  BID post-op   Goal of Therapy:  INR 2-3   Today, 02/16/2015: No INR response yet, as expected, after restart of warfarin last night (see MAR for dosages). Lovenox 40 mg SQ q12h began today per ortho orders. H/H down slightly consistent with ABLA. Pltc slightly low, but improving. No unexpected bleeding reported.  Plan: 1. Warfarin 6 mg PO x 1 today at 6 PM 2. Continue Lovenox 40 mg SQ q12h per ortho orders. 3. Follow CBC, watch pltc 4. PT/INR daily while inpatient. 5. When patient goes to SNF will need close INR monitoring.  Suggest checking INR at least twice a week until therapeutic and stable, then at least once a week until discharged from SNF and  follow-up care is resumed at anticoagulation clinic.  Elie Goody, PharmD, BCPS Pager: [phone] 02/16/2015  10:49 AM

## 2015-02-16 NOTE — Progress Notes (Signed)
Utilization review completed.  

## 2015-02-16 NOTE — Progress Notes (Signed)
     Subjective: 1 Day Post-Op Procedure(s) (LRB): LEFT TOTAL KNEE ARTHROPLASTY (Left)   Patient reports pain as mild, pain controlled. No events throughout the night. Feels that the knee is doing real well and happy with the results so far.     Objective:   VITALS:   Filed Vitals:   02/16/15 0957  BP: 117/58  Pulse: 66  Temp: 97.3 F (36.3 C)  Resp: 16    Dorsiflexion/Plantar flexion intact Incision: dressing C/D/I No cellulitis present Compartment soft Bilateral leg swelling, same as prior to surgery  LABS  Recent Labs  02/15/15 2110 02/16/15 0506  HGB 12.0* 11.7*  HCT 37.2* 35.0*  WBC 8.3 8.9  PLT 101* 105*     Recent Labs  02/15/15 2110 02/16/15 0506  NA  --  135  K  --  3.1*  BUN  --  27*  CREATININE 0.86 0.96  GLUCOSE  --  194*     Assessment/Plan: 1 Day Post-Op Procedure(s) (LRB): LEFT TOTAL KNEE ARTHROPLASTY (Left) Foley cath d/c'ed Advance diet Up with therapy D/C IV fluids Discharge to SNF eventually, when ready  Obese (BMI 30-39.9) Estimated body mass index is 39.85 kg/(m^2) as calculated from the following:   Height as of this encounter: 5' 8.5" (1.74 m).   Weight as of this encounter: 120.657 kg (266 lb). Patient also counseled that weight may inhibit the healing process Patient counseled that losing weight will help with future health issues  Hypokalemia Treated with oral potassium and will observe    Anastasio Auerbach. Emiliano Welshans   PAC  02/16/2015, 10:02 AM

## 2015-02-16 NOTE — Care Management Note (Signed)
    Page 1 of 1   02/16/2015     2:45:07 PM CARE MANAGEMENT NOTE 02/16/2015  Patient:  [name], [name]   Account Number:  [id number]  Date Initiated:  02/16/2015  Documentation initiated by:  [facility]  Subjective/Objective Assessment:   adm: LEFT TOTAL KNEE ARTHROPLASTY (Left)     Action/Plan:   discharge planning   Anticipated DC Date:  02/17/2015   Anticipated DC Plan:  SKILLED NURSING FACILITY         Choice offered to / List presented to:             Status of service:  Completed, signed off Medicare Important Message given?   (If response is "NO", the following Medicare IM given date fields will be blank) Date Medicare IM given:   Medicare IM given by:   Date Additional Medicare IM given:   Additional Medicare IM given by:    Discharge Disposition:  SKILLED NURSING FACILITY  Per UR Regulation:    If discussed at Long Length of Stay Meetings, dates discussed:    Comments:  02/16/15 14:34 CM notes pt to go to SNF for rehab; CSW arranging.  No other CM needs were communicated.  Freddy Jaksch, BSN< CM [phone].

## 2015-02-16 NOTE — Progress Notes (Signed)
OT Cancellation Note  Patient Details Name: [name] MRN: [id number] DOB: Jan 25, 1929   Cancelled Treatment:    Reason Eval/Treat Not Completed: Other (comment) Note plan for SNF. Will defer OT eval to next venue.  Lennox Laity  [phone] 02/16/2015, 10:51 AM

## 2015-02-16 NOTE — Evaluation (Signed)
Physical Therapy Evaluation Patient Details Name: [name] MRN: [id number] DOB: [birth year] Today's Date: 02/16/2015   History of Present Illness  81 yo male s/p L TKA. Hx of DM, neuropathy  Clinical Impression  On eval, pt required Min assist for mobility-able to ambulate ~25 feet with RW. Pt tolerated activity fairly well-rated pain 5/10. Recommend SNF.     Follow Up Recommendations SNF    Equipment Recommendations  None recommended by PT    Recommendations for Other Services       Precautions / Restrictions Precautions Precautions: Fall Restrictions Weight Bearing Restrictions: No LLE Weight Bearing: Weight bearing as tolerated      Mobility  Bed Mobility Overal bed mobility: Needs Assistance Bed Mobility: Supine to Sit           General bed mobility comments: Assist for L LE  Transfers Overall transfer level: Needs assistance Equipment used: Rolling walker (2 wheeled) Transfers: Sit to/from Stand Sit to Stand: Min assist         General transfer comment: Assist to rise, stabilize, control descent. Multimodal cues for safety, technique, hand placement  Ambulation/Gait Ambulation/Gait assistance: Min assist Ambulation Distance (Feet): 25 Feet Assistive device: Rolling walker (2 wheeled) Gait Pattern/deviations: Step-to pattern;Trunk flexed;Antalgic;Decreased stance time - left;Decreased stride length     General Gait Details: VCs safety, technique, sequence. Assist to stabilize. Followed closely with recliner.   Stairs            Wheelchair Mobility    Modified Rankin (Stroke Patients Only)       Balance                                             Pertinent Vitals/Pain Pain Assessment: 0-10 Pain Score: 5  Pain Location: L TKA Pain Descriptors / Indicators: Sore;Aching Pain Intervention(s): Monitored during session;Ice applied;Repositioned    Home Living Family/patient expects to be discharged to:: Skilled  nursing facility Living Arrangements: Alone                    Prior Function Level of Independence: Independent with assistive device(s)         Comments: uses RW     Hand Dominance        Extremity/Trunk Assessment   Upper Extremity Assessment: Generalized weakness           Lower Extremity Assessment: Generalized weakness      Cervical / Trunk Assessment: Kyphotic  Communication   Communication: No difficulties  Cognition Arousal/Alertness: Awake/alert Behavior During Therapy: WFL for tasks assessed/performed Overall Cognitive Status: Within Functional Limits for tasks assessed                      General Comments      Exercises Total Joint Exercises Ankle Circles/Pumps: AROM;Both;10 reps;Supine Quad Sets: AROM;Both;10 reps;Supine Heel Slides: AAROM;Left;10 reps;Supine Hip ABduction/ADduction: AAROM;Left;10 reps;Supine Straight Leg Raises: AAROM;Left;10 reps;Supine Goniometric ROM: ~10-50 degrees      Assessment/Plan    PT Assessment Patient needs continued PT services  PT Diagnosis Difficulty walking;Generalized weakness;Acute pain   PT Problem List Decreased strength;Decreased range of motion;Decreased activity tolerance;Decreased balance;Decreased mobility;Decreased knowledge of use of DME;Pain;Obesity  PT Treatment Interventions DME instruction;Gait training;Functional mobility training;Therapeutic activities;Therapeutic exercise;Patient/family education;Balance training   PT Goals (Current goals can be found in the Care Plan section) Acute Rehab PT Goals Patient Stated  Goal: rehab to regain indpendence PT Goal Formulation: With patient Time For Goal Achievement: 02/23/15 Potential to Achieve Goals: Good    Frequency 7X/week   Barriers to discharge        Co-evaluation               End of Session Equipment Utilized During Treatment: Gait belt Activity Tolerance: Patient limited by fatigue;Patient limited by  pain Patient left: in chair;with call bell/phone within reach           Time: 1111-1133 PT Time Calculation (min) (ACUTE ONLY): 22 min   Charges:   PT Evaluation $Initial PT Evaluation Tier I: 1 Procedure     PT G Codes:        Rebeca Alert, MPT Pager: [phone]

## 2015-02-16 NOTE — Progress Notes (Signed)
Physical Therapy Treatment Patient Details Name: [name] MRN: [id number] DOB: December 22, 1928 Today's Date: 02/16/2015    History of Present Illness 81 yo male s/p L TKA. Hx of DM, neuropathy    PT Comments    Progressing with mobility. Continue to recommend SNF.   Follow Up Recommendations  SNF     Equipment Recommendations  None recommended by PT    Recommendations for Other Services       Precautions / Restrictions Precautions Precautions: Fall Restrictions Weight Bearing Restrictions: No LLE Weight Bearing: Weight bearing as tolerated    Mobility  Bed Mobility Overal bed mobility: Needs Assistance Bed Mobility: Supine to Sit           General bed mobility comments: Assist for L LE  Transfers Overall transfer level: Needs assistance Equipment used: Rolling walker (2 wheeled) Transfers: Sit to/from Stand Sit to Stand: Min assist         General transfer comment: Assist to rise, stabilize, control descent. Multimodal cues for safety, technique, hand placement  Ambulation/Gait Ambulation/Gait assistance: Min assist Ambulation Distance (Feet): 50 Feet Assistive device: Rolling walker (2 wheeled) Gait Pattern/deviations: Step-to pattern;Trunk flexed;Decreased stride length     General Gait Details: VCs safety, technique, sequence. Assist to stabilize. Followed with recliner.    Stairs            Wheelchair Mobility    Modified Rankin (Stroke Patients Only)       Balance                                    Cognition Arousal/Alertness: Awake/alert Behavior During Therapy: WFL for tasks assessed/performed Overall Cognitive Status: Within Functional Limits for tasks assessed                      Exercises Total Joint Exercises Ankle Circles/Pumps: AROM;Both;10 reps;Supine Quad Sets: AROM;Both;10 reps;Supine Heel Slides: AAROM;Left;10 reps;Supine Hip ABduction/ADduction: AAROM;Left;10 reps;Supine Straight  Leg Raises: AAROM;Left;10 reps;Supine Goniometric ROM: ~10-50 degrees    General Comments        Pertinent Vitals/Pain Pain Assessment: 0-10 Pain Score: 5  Pain Location: L TKA Pain Descriptors / Indicators: Aching;Sore Pain Intervention(s): Repositioned;Ice applied    Home Living Family/patient expects to be discharged to:: Skilled nursing facility Living Arrangements: Alone                  Prior Function Level of Independence: Independent with assistive device(s)      Comments: uses RW   PT Goals (current goals can now be found in the care plan section) Acute Rehab PT Goals Patient Stated Goal: rehab to regain indpendence PT Goal Formulation: With patient Time For Goal Achievement: 02/23/15 Potential to Achieve Goals: Good Progress towards PT goals: Progressing toward goals    Frequency  7X/week    PT Plan Current plan remains appropriate    Co-evaluation             End of Session Equipment Utilized During Treatment: Gait belt Activity Tolerance: Patient tolerated treatment well Patient left: in chair;with call bell/phone within reach;with family/visitor present     Time: 2094-7096 PT Time Calculation (min) (ACUTE ONLY): 15 min  Charges:  $Gait Training: 8-22 mins                    G Codes:      Rebeca Alert, MPT Pager: [phone]

## 2015-02-16 NOTE — Progress Notes (Signed)
Clinical Social Work Department CLINICAL SOCIAL WORK PLACEMENT NOTE 02/16/2015  Patient:  [name], [name]  Account Number:  [id number] Admit date:  02/15/2015  Clinical Social Worker:  Cori Razor, LCSW  Date/time:  02/16/2015 08:09 AM  Clinical Social Work is seeking post-discharge placement for this patient at the following level of care:   SKILLED NURSING   (*CSW will update this form in Epic as items are completed)   02/16/2015  Patient/family provided with [facility] Department of Clinical Social Work's list of facilities offering this level of care within the geographic area requested by the patient (or if unable, by the patient's family).  02/16/2015  Patient/family informed of their freedom to choose among providers that offer the needed level of care, that participate in Medicare, Medicaid or managed care program needed by the patient, have an available bed and are willing to accept the patient.    Patient/family informed of [facility]' ownership interest in [facility], as well as of the fact that they are under no obligation to receive care at this facility.  PASARR submitted to [employer] on 02/15/2015 PASARR number received on 02/15/2015  FL2 transmitted to all facilities in geographic area requested by pt/family on  02/16/2015 FL2 transmitted to all facilities within larger geographic area on   Patient informed that his/her managed care company has contracts with or will negotiate with  certain facilities, including the following:     Patient/family informed of bed offers received:   Patient chooses bed at  Physician recommends and patient chooses bed at    Patient to be transferred to  on   Patient to be transferred to facility by  Patient and family notified of transfer on  Name of family member notified:    The following physician request were entered in Epic:   Additional Comments:  Cori Razor LCSW [phone]

## 2015-02-17 LAB — BASIC METABOLIC PANEL
ANION GAP: 10 (ref 5–15)
BUN: 39 mg/dL — ABNORMAL HIGH (ref 6–23)
CHLORIDE: 96 mmol/L (ref 96–112)
CO2: 29 mmol/L (ref 19–32)
Calcium: 8.5 mg/dL (ref 8.4–10.5)
Creatinine, Ser: 1.28 mg/dL (ref 0.50–1.35)
GFR calc non Af Amer: 50 mL/min — ABNORMAL LOW (ref >90)
GFR, EST AFRICAN AMERICAN: 58 mL/min — AB (ref >90)
Glucose, Bld: 173 mg/dL — ABNORMAL HIGH (ref 70–99)
Potassium: 2.7 mmol/L — CL (ref 3.5–5.1)
Sodium: 135 mmol/L (ref 135–145)

## 2015-02-17 LAB — GLUCOSE, CAPILLARY
GLUCOSE-CAPILLARY: 143 mg/dL — AB (ref 70–99)
Glucose-Capillary: 161 mg/dL — ABNORMAL HIGH (ref 70–99)
Glucose-Capillary: 160 mg/dL — ABNORMAL HIGH (ref 70–99)
Glucose-Capillary: 158 mg/dL — ABNORMAL HIGH (ref 70–99)

## 2015-02-17 LAB — PROTIME-INR
INR: 1.49 (ref 0.00–1.49)
Prothrombin Time: 18.2 seconds — ABNORMAL HIGH (ref 11.6–15.2)

## 2015-02-17 LAB — CBC
HEMATOCRIT: 34.2 % — AB (ref 39.0–52.0)
HEMOGLOBIN: 11.6 g/dL — AB (ref 13.0–17.0)
MCH: 31.4 pg (ref 26.0–34.0)
MCHC: 33.9 g/dL (ref 30.0–36.0)
MCV: 92.4 fL (ref 78.0–100.0)
PLATELETS: 120 10*3/uL — AB (ref 150–400)
RBC: 3.7 MIL/uL — AB (ref 4.22–5.81)
RDW: 14 % (ref 11.5–15.5)
WBC: 11.9 10*3/uL — AB (ref 4.0–10.5)

## 2015-02-17 MED ORDER — WARFARIN SODIUM 6 MG PO TABS
6.0000 mg | ORAL_TABLET | Freq: Once | ORAL | Status: AC
  Administered 2015-02-17: 6 mg via ORAL
  Filled 2015-02-17 (×2): qty 1

## 2015-02-17 MED ORDER — FUROSEMIDE 80 MG PO TABS
80.0000 mg | ORAL_TABLET | Freq: Two times a day (BID) | ORAL | Status: DC
  Administered 2015-02-18 – 2015-02-19 (×3): 80 mg via ORAL
  Filled 2015-02-17 (×5): qty 1

## 2015-02-17 NOTE — Progress Notes (Signed)
CRITICAL VALUE ALERT  Critical value received:  Potassium 2.7  Date of notification:  02/17/15  Time of notification: 0720  Critical value read back:Yes.    Nurse who received alert:  Marni Griffon, RN  MD notified (1st page):  Lanney Gins, PA-C  Time of first page:  0730  MD notified (2nd page): NA  Time of second page: NA  Responding MD:  Lanney Gins, PA-C  Time MD responded:  0730

## 2015-02-17 NOTE — Progress Notes (Signed)
Physical Therapy Treatment Patient Details Name: [name] MRN: [id number] DOB: 1929/03/08 Today's Date: 02/17/2015    History of Present Illness 81 yo male s/p L TKA. Hx of DM, neuropathy    PT Comments    Progressing with mobility. Plan is for [facility] possibly tomorrow.   Follow Up Recommendations  SNF     Equipment Recommendations  None recommended by PT    Recommendations for Other Services       Precautions / Restrictions Precautions Precautions: Fall Restrictions Weight Bearing Restrictions: No LLE Weight Bearing: Weight bearing as tolerated    Mobility  Bed Mobility               General bed mobility comments: pt OOB in recliner  Transfers Overall transfer level: Needs assistance Equipment used: Rolling walker (2 wheeled) Transfers: Sit to/from Stand Sit to Stand: Min assist         General transfer comment: Assist to rise, stabilize, control descent. Multimodal cues for safety, technique, hand placement  Ambulation/Gait Ambulation/Gait assistance: Min assist Ambulation Distance (Feet): 75 Feet Assistive device: Rolling walker (2 wheeled) Gait Pattern/deviations: Step-to pattern;Trunk flexed;Decreased stride length;Antalgic     General Gait Details: VCs safety, technique, sequence, posture, distance from walker.  Assist to stabilize.    Stairs            Wheelchair Mobility    Modified Rankin (Stroke Patients Only)       Balance                                    Cognition Arousal/Alertness: Awake/alert Behavior During Therapy: WFL for tasks assessed/performed Overall Cognitive Status: Within Functional Limits for tasks assessed                      Exercises Total Joint Exercises Ankle Circles/Pumps: AROM;Both;10 reps;Supine Quad Sets: AROM;Both;10 reps;Supine Heel Slides: AAROM;Left;10 reps;Supine Hip ABduction/ADduction: AAROM;Left;10 reps;Supine Straight Leg Raises: AAROM;Left;10  reps;Supine Goniometric ROM: ~10-50 degrees    General Comments        Pertinent Vitals/Pain Pain Assessment: 0-10 Pain Score: 7  Pain Location: L knee, thigh Pain Descriptors / Indicators: Sore Pain Intervention(s): Monitored during session;Ice applied;Repositioned    Home Living                      Prior Function            PT Goals (current goals can now be found in the care plan section) Progress towards PT goals: Progressing toward goals    Frequency  7X/week    PT Plan Current plan remains appropriate    Co-evaluation             End of Session   Activity Tolerance: Patient tolerated treatment well Patient left: in chair;with call bell/phone within reach     Time: 1497-0263 PT Time Calculation (min) (ACUTE ONLY): 13 min  Charges:  $Gait Training: 8-22 mins                    G Codes:      Rebeca Alert, MPT Pager: [phone]

## 2015-02-17 NOTE — Progress Notes (Signed)
Physical Therapy Treatment Patient Details Name: [name] MRN: [id number] DOB: 09-16-1929 Today's Date: 02/17/2015    History of Present Illness 81 yo male s/p L TKA. Hx of DM, neuropathy    PT Comments    Pt declined ambulation this session due to increased pain. Assisted pt back to bed at his request. Made RN aware of need for pain meds.   Follow Up Recommendations  SNF     Equipment Recommendations  None recommended by PT    Recommendations for Other Services       Precautions / Restrictions Precautions Precautions: Fall Restrictions Weight Bearing Restrictions: No LLE Weight Bearing: Weight bearing as tolerated    Mobility  Bed Mobility   Bed Mobility: Sit to Supine       Sit to supine: Min assist   General bed mobility comments: Assist for L LE  Transfers Overall transfer level: Needs assistance Equipment used: Rolling walker (2 wheeled) Transfers: Sit to/from [employer] Sit to Stand: Min assist Stand pivot transfers: Min assist       General transfer comment: Assist to rise, stabilize, control descent. Multimodal cues for safety, technique, hand placement. Stand pivot from recliner to bed with RW  Ambulation/Gait             General Gait Details: Pt unable this session due to pain. Made RN aware of need for pain meds. Assisted pt back to bed at his request.    Stairs            Wheelchair Mobility    Modified Rankin (Stroke Patients Only)       Balance                                    Cognition                            Exercises      General Comments        Pertinent Vitals/Pain Pain Assessment: Faces Faces Pain Scale: Hurts whole lot Pain Location: L knee, thigh Pain Descriptors / Indicators: Sore Pain Intervention(s): Patient requesting pain meds-RN notified;Ice applied;Repositioned    Home Living                      Prior Function             PT Goals (current goals can now be found in the care plan section) Progress towards PT goals: Progressing toward goals    Frequency  7X/week    PT Plan Current plan remains appropriate    Co-evaluation             End of Session   Activity Tolerance: Patient limited by pain Patient left: in bed;with call bell/phone within reach     Time: 1357-1406 PT Time Calculation (min) (ACUTE ONLY): 9 min  Charges:  $Therapeutic Activity: 8-22 mins                    G Codes:      Rebeca Alert, MPT Pager: [phone]

## 2015-02-17 NOTE — Progress Notes (Signed)
     Subjective: 2 Days Post-Op Procedure(s) (LRB): LEFT TOTAL KNEE ARTHROPLASTY (Left)   Patient reports pain as mild, pain controlled. Feels that the knee is doing well. Knows that he is hypokalemic.  He also knows that it is from the amount of Lasix he is taking. States that he is definitely dry and pees all the time. We have talked about adjusting his medications.   Objective:   VITALS:   Filed Vitals:   02/17/15 0611  BP: 112/55  Pulse: 65  Temp: 97.8 F (36.6 C)  Resp: 16    Dorsiflexion/Plantar flexion intact Incision: dressing C/D/I No cellulitis present Compartment soft  LABS  Recent Labs  02/15/15 2110 02/16/15 0506 02/17/15 0535  HGB 12.0* 11.7* 11.6*  HCT 37.2* 35.0* 34.2*  WBC 8.3 8.9 11.9*  PLT 101* 105* 120*     Recent Labs  02/15/15 2110 02/16/15 0506 02/17/15 0535  NA  --  135 135  K  --  3.1* 2.7*  BUN  --  27* 39*  CREATININE 0.86 0.96 1.28  GLUCOSE  --  194* 173*     Assessment/Plan: 2 Days Post-Op Procedure(s) (LRB): LEFT TOTAL KNEE ARTHROPLASTY (Left) He is -4010 with I&O's from yesterday and is dry, he states that he can feel this. Lasix already given this morning, but will hold 1 dose later.  He states that he has done well on 80 once a day previously. Will restart some fluid to help his increasing BUN.  Will continue his 100 meq of K+ bid. Up with therapy Discharge to SNF eventually, when ready  Obese (BMI 30-39.9) Estimated body mass index is 39.85 kg/(m^2) as calculated from the following:   Height as of this encounter: 5' 8.5" (1.74 m).   Weight as of this encounter: 120.657 kg (266 lb). Patient also counseled that weight may inhibit the healing process Patient counseled that losing weight will help with future health issues  Hypokalemia Treated with oral potassium as well as some IV Hold a dose of Lasix, as pt states he has done well previously on only 80 qd. Will reevaluate with labs tomorrow.     Anastasio Auerbach  Sheliah Fiorillo   PAC  02/17/2015, 9:27 AM

## 2015-02-17 NOTE — Progress Notes (Signed)
ANTICOAGULATION CONSULT NOTE   Pharmacy Consult for Warfarin Indication: atrial fibrillation  No Known Allergies  Patient Measurements: Height: 5' 8.5" (174 cm) Weight: 266 lb (120.657 kg) IBW/kg (Calculated) : 69.55  Vital Signs: Temp: 97.8 F (36.6 C) (03/10 0611) Temp Source: Oral (03/10 0611) BP: 112/55 mmHg (03/10 0611) Pulse Rate: 65 (03/10 0611)  Labs:  Recent Labs  02/15/15 1355  02/15/15 2110 02/16/15 0506 02/17/15 0535 02/17/15 0752  HGB  --   < > 12.0* 11.7* 11.6*  --   HCT  --   --  37.2* 35.0* 34.2*  --   PLT  --   --  101* 105* 120*  --   LABPROT 15.8*  --   --  15.6*  --  18.2*  INR 1.24  --   --  1.23  --  1.49  CREATININE  --   --  0.86 0.96 1.28  --   < > = values in this interval not displayed.  Estimated Creatinine Clearance: 54.7 mL/min (by C-G formula based on Cr of 1.28).   Medical History: Past Medical History  Diagnosis Date  . CHF (congestive heart failure)   . HTN (hypertension)   . OA (osteoarthritis)   . BPH (benign prostatic hyperplasia)   . Obesity   . Anemia   . Diabetes mellitus type II   . Atrial fibrillation   . Cardiomyopathy   . Hyperlipidemia   . Peptic ulcer   . Bradycardia   . History of colonoscopy   . ICD (implantable cardiac defibrillator) in place 10/14/2012    biventricular  . GERD (gastroesophageal reflux disease)     Medications:  Scheduled:  . allopurinol  100 mg Oral Daily  . atorvastatin  20 mg Oral Daily  . carvedilol  12.5 mg Oral BID WC  . celecoxib  200 mg Oral Q12H  . docusate sodium  100 mg Oral BID  . enoxaparin (LOVENOX) injection  40 mg Subcutaneous Q12H  . ferrous sulfate  325 mg Oral TID PC  . [START ON 02/18/2015] furosemide  80 mg Oral BID  . HYDROcodone-acetaminophen  1-2 tablet Oral Q4H  . insulin aspart  0-15 Units Subcutaneous TID WC  . metolazone  2.5 mg Oral Daily  . polyethylene glycol  17 g Oral BID  . potassium chloride SA  100 mEq Oral BID  . pravastatin  40 mg Oral Daily   . tamsulosin  0.4 mg Oral BID  . Warfarin - Pharmacist Dosing Inpatient   Does not apply q1800   Infusions:  . sodium chloride 0.9 % 1,000 mL with potassium chloride 10 mEq infusion 75 mL/hr at 02/17/15 1015    Assessment:  86 yr obese male on warfarin PTA for h/o AFib underwent left TKA 02/15/15  Home regimen of warfarin =  daily except 2.5mg  on Thursday (per clinic notes).  Last dose of warfarin was taken on 3/3.  INR checked on 3/1 was therapeutic (2.35). Patient underwent bridging with Lovenox up until surgery.  Pharmacy asked to resume warfarin dosing  MD bridging with Lovenox  BID post-op   Goal of Therapy:  INR 2-3   Today, 02/17/2015: INR responding appropriately after 2 doses Lovenox 40 mg SQ q12h began today per ortho orders. H/H down slightly consistent with ABLA. Pltc slightly low, but improving. No unexpected bleeding reported.  Plan: 1. Warfarin 6 mg PO x 1 again today at 6 PM 2. Continue Lovenox 40 mg SQ q12h per ortho orders. 3. Follow CBC,  watch pltc 4. PT/INR daily while inpatient. 5. When patient goes to SNF will need close INR monitoring.  Suggest checking INR at least twice a week until therapeutic and stable, then at least once a week until discharged from SNF and follow-up care is resumed at anticoagulation clinic. 6. Given visible response in INR, seems reasonable to continue patient at 5 mg and transition back to home regimen once INR > 1.7  Bernadene Person, PharmD Pager: [phone] 02/17/2015, 12:38 PM

## 2015-02-18 DIAGNOSIS — E876 Hypokalemia: Secondary | ICD-10-CM

## 2015-02-18 DIAGNOSIS — I482 Chronic atrial fibrillation: Secondary | ICD-10-CM

## 2015-02-18 DIAGNOSIS — I5022 Chronic systolic (congestive) heart failure: Secondary | ICD-10-CM

## 2015-02-18 LAB — GLUCOSE, CAPILLARY
GLUCOSE-CAPILLARY: 146 mg/dL — AB (ref 70–99)
Glucose-Capillary: 190 mg/dL — ABNORMAL HIGH (ref 70–99)
Glucose-Capillary: 168 mg/dL — ABNORMAL HIGH (ref 70–99)
Glucose-Capillary: 133 mg/dL — ABNORMAL HIGH (ref 70–99)

## 2015-02-18 LAB — CBC
HCT: 32.7 % — ABNORMAL LOW (ref 39.0–52.0)
Hemoglobin: 11.1 g/dL — ABNORMAL LOW (ref 13.0–17.0)
MCH: 31.5 pg (ref 26.0–34.0)
MCHC: 33.9 g/dL (ref 30.0–36.0)
MCV: 92.9 fL (ref 78.0–100.0)
PLATELETS: 117 10*3/uL — AB (ref 150–400)
RBC: 3.52 MIL/uL — AB (ref 4.22–5.81)
RDW: 14.3 % (ref 11.5–15.5)
WBC: 11.6 10*3/uL — AB (ref 4.0–10.5)

## 2015-02-18 LAB — PROTIME-INR
INR: 1.92 — ABNORMAL HIGH (ref 0.00–1.49)
Prothrombin Time: 22.2 seconds — ABNORMAL HIGH (ref 11.6–15.2)

## 2015-02-18 LAB — BASIC METABOLIC PANEL
ANION GAP: 12 (ref 5–15)
BUN: 44 mg/dL — AB (ref 6–23)
CHLORIDE: 94 mmol/L — AB (ref 96–112)
CO2: 33 mmol/L — ABNORMAL HIGH (ref 19–32)
Calcium: 8.4 mg/dL (ref 8.4–10.5)
Creatinine, Ser: 1.2 mg/dL (ref 0.50–1.35)
GFR calc Af Amer: 62 mL/min — ABNORMAL LOW (ref >90)
GFR calc non Af Amer: 54 mL/min — ABNORMAL LOW (ref >90)
Glucose, Bld: 157 mg/dL — ABNORMAL HIGH (ref 70–99)
POTASSIUM: 2.7 mmol/L — AB (ref 3.5–5.1)
Sodium: 139 mmol/L (ref 135–145)

## 2015-02-18 LAB — POTASSIUM: Potassium: 3.3 mmol/L — ABNORMAL LOW (ref 3.5–5.1)

## 2015-02-18 MED ORDER — SODIUM CHLORIDE 0.9 % IV SOLN
INTRAVENOUS | Status: DC
  Filled 2015-02-18: qty 1000

## 2015-02-18 MED ORDER — POTASSIUM CHLORIDE 10 MEQ/100ML IV SOLN
10.0000 meq | INTRAVENOUS | Status: AC
  Administered 2015-02-18 (×4): 10 meq via INTRAVENOUS
  Filled 2015-02-18 (×4): qty 100

## 2015-02-18 MED ORDER — HYDROCODONE-ACETAMINOPHEN 7.5-325 MG PO TABS: 1.0000 | ORAL_TABLET | ORAL | Status: DC | PRN

## 2015-02-18 MED ORDER — FERROUS SULFATE 325 (65 FE) MG PO TABS: 325.0000 mg | ORAL_TABLET | Freq: Three times a day (TID) | ORAL | Status: DC

## 2015-02-18 MED ORDER — WARFARIN SODIUM 5 MG PO TABS: ORAL_TABLET | ORAL | Status: DC

## 2015-02-18 MED ORDER — ENOXAPARIN SODIUM 40 MG/0.4ML ~~LOC~~ SOLN: 40.0000 mg | Freq: Two times a day (BID) | SUBCUTANEOUS | Status: DC

## 2015-02-18 MED ORDER — POLYETHYLENE GLYCOL 3350 17 G PO PACK
17.0000 g | PACK | Freq: Two times a day (BID) | ORAL | Status: DC
Start: 2015-02-18 — End: 2017-11-26

## 2015-02-18 MED ORDER — WARFARIN SODIUM 5 MG PO TABS
5.0000 mg | ORAL_TABLET | Freq: Once | ORAL | Status: AC
  Administered 2015-02-18: 5 mg via ORAL
  Filled 2015-02-18: qty 1

## 2015-02-18 MED ORDER — DOCUSATE SODIUM 100 MG PO CAPS: 100.0000 mg | ORAL_CAPSULE | Freq: Two times a day (BID) | ORAL | Status: DC

## 2015-02-18 NOTE — Progress Notes (Signed)
     Subjective: 3 Days Post-Op Procedure(s) (LRB): LEFT TOTAL KNEE ARTHROPLASTY (Left)   Patient reports pain as mild.  Little more sore, but states that he knows he has to work the soreness out. Feels good, doesn't feel that he was urinating quite as much last night.  Objective:   VITALS:   Filed Vitals:   02/18/15 0551  BP: 133/60  Pulse: 63  Temp: 97.3 F (36.3 C)  Resp: 16    Dorsiflexion/Plantar flexion intact Incision: dressing C/D/I No cellulitis present Compartment soft  LABS  Recent Labs  02/16/15 0506 02/17/15 0535 02/18/15 0531  HGB 11.7* 11.6* 11.1*  HCT 35.0* 34.2* 32.7*  WBC 8.9 11.9* 11.6*  PLT 105* 120* 117*     Recent Labs  02/16/15 0506 02/17/15 0535 02/18/15 0531  NA 135 135 139  K 3.1* 2.7* 2.7*  BUN 27* 39* 44*  CREATININE 0.96 1.28 1.20  GLUCOSE 194* 173* 157*     Assessment/Plan: 3 Days Post-Op Procedure(s) (LRB): LEFT TOTAL KNEE ARTHROPLASTY (Left)   Cardiology consult placed. Up with therapy Discharge to SNF when ready and if good with cardiology   Obese (BMI 30-39.9) Estimated body mass index is 39.85 kg/(m^2) as calculated from the following:  Height as of this encounter: 5' 8.5" (1.74 m).  Weight as of this encounter: 120.657 kg (266 lb). Patient also counseled that weight may inhibit the healing process Patient counseled that losing weight will help with future health issues  Hypokalemia Treated with oral potassium as well as some IV Cardiology consult for evaluation.     [name]   PAC  02/18/2015, 7:57 AM

## 2015-02-18 NOTE — Discharge Summary (Signed)
Physician Discharge Summary  Patient ID: [name] MRN: [id number] DOB/AGE: 1929/07/16 81 y.o.  Admit date: 02/15/2015 Discharge date:  02/19/2015  Procedures:  Procedure(s) (LRB): LEFT TOTAL KNEE ARTHROPLASTY (Left)  Attending Physician:  Dr. Durene Romans   Admission Diagnoses:   Left knee primary OA / pain  Discharge Diagnoses:  Principal Problem:   S/P left TKA Active Problems:   HYPOKALEMIA   Atrial fibrillation   Congestive heart failure   S/P knee replacement  Past Medical History  Diagnosis Date  . CHF (congestive heart failure)   . HTN (hypertension)   . OA (osteoarthritis)   . BPH (benign prostatic hyperplasia)   . Obesity   . Anemia   . Diabetes mellitus type II   . Atrial fibrillation   . Cardiomyopathy   . Hyperlipidemia   . Peptic ulcer   . Bradycardia   . History of colonoscopy   . ICD (implantable cardiac defibrillator) in place 10/14/2012    biventricular  . GERD (gastroesophageal reflux disease)     HPI:    [name], 81 y.o. male, has a history of pain and functional disability in the left knee due to arthritis and has failed non-surgical conservative treatments for greater than 12 weeks to include NSAID's and/or analgesics, corticosteriod injections, use of assistive devices and activity modification. Onset of symptoms was gradual, starting 5+ years ago with gradually worsening course since that time. The patient noted prior procedures on the knee to include arthroplasty on the right knee per Dr. Fannie Knee in 1997. Patient currently rates pain in the left knee(s) at 7 out of 10 with activity. Patient has worsening of pain with activity and weight bearing, pain that interferes with activities of daily living, pain with passive range of motion, crepitus and joint swelling. Patient has evidence of periarticular osteophytes and joint space narrowing by imaging studies. There is no active infection. Risks, benefits and expectations were  discussed with the patient. Risks including but not limited to the risk of anesthesia, blood clots, nerve damage, blood vessel damage, failure of the prosthesis, infection and up to and including death. Patient understand the risks, benefits and expectations and wishes to proceed with surgery.   PCP: Romero Belling, MD   Discharged Condition: good  Hospital Course:  Patient underwent the above stated procedure on 02/15/2015. Patient tolerated the procedure well and brought to the recovery room in good condition and subsequently to the floor.  POD #1 BP: 117/58 ; Pulse: 66 ; Temp: 97.3 F (36.3 C) ; Resp: 16 Patient reports pain as mild, pain controlled. No events throughout the night. Feels that the knee is doing real well and happy with the results so far.  Dorsiflexion/plantar flexion intact, incision: dressing C/D/I, no cellulitis present and compartment soft.   LABS  Basename    HGB  11.7  HCT  35.0   POD #2  BP: 112/55 ; Pulse: 65 ; Temp: 97.8 F (36.6 C) ; Resp: 16 Patient reports pain as mild, pain controlled. Feels that the knee is doing well. Knows that he is hypokalemic. He also knows that it is from the amount of Lasix he is taking. States that he is definitely dry and pees all the time. We have talked about adjusting his medications.  Dorsiflexion/plantar flexion intact, incision: dressing C/D/I, no cellulitis present and compartment soft.   LABS  Basename    HGB  11.6  HCT  34.2   POD #3  BP: 133/60 ; Pulse: 63 ;  Temp: 97.3 F (36.3 C) ; Resp: 16 Patient reports pain as mild. Little more sore, but states that he knows he has to work the soreness out. Feels good, doesn't feel that he was urinating quite as much last night. Cardiology consult and addressing hypokalemia. Dorsiflexion/plantar flexion intact, incision: dressing C/D/I, no cellulitis present and compartment soft.   LABS   No new labs  Discharge Exam: General appearance: alert, cooperative and no  distress Extremities: Homans sign is negative, no sign of DVT, no edema, redness or tenderness in the calves or thighs and no ulcers, gangrene or trophic changes  Disposition:     Skilled nursing facility with follow up in 2 weeks   Follow-up Information    Follow up with Shelda Pal, MD. Schedule an appointment as soon as possible for a visit in 2 weeks.   Specialty:  Orthopedic Surgery   Contact information:   [address] [place] [address] [phone]       Discharge Instructions    Call MD / Call 911    Complete by:  As directed   If you experience chest pain or shortness of breath, CALL 911 and be transported to the hospital emergency room.  If you develope a fever above 101 F, pus (white drainage) or increased drainage or redness at the wound, or calf pain, call your surgeon's office.     Change dressing    Complete by:  As directed   Maintain surgical dressing until follow up in the clinic. If the edges start to pull up, may reinforce with tape. If the dressing is no longer working, may remove and cover with gauze and tape, but must keep the area dry and clean.  Call with any questions or concerns.     Constipation Prevention    Complete by:  As directed   Drink plenty of fluids.  Prune juice may be helpful.  You may use a stool softener, such as Colace (over the counter) 100 mg twice a day.  Use MiraLax (over the counter) for constipation as needed.     Diet - low sodium heart healthy    Complete by:  As directed      Discharge instructions    Complete by:  As directed   Maintain surgical dressing until follow up in the clinic. If the edges start to pull up, may reinforce with tape. If the dressing is no longer working, may remove and cover with gauze and tape, but must keep the area dry and clean.  Follow up in 2 weeks at [facility]. Call with any questions or concerns.     Increase activity slowly as tolerated    Complete by:  As  directed      TED hose    Complete by:  As directed   Use stockings (TED hose) for 2 weeks on both leg(s).  You may remove them at night for sleeping.     Weight bearing as tolerated    Complete by:  As directed   Laterality:  left  Extremity:  Lower             Medication List    STOP taking these medications        acetaminophen 325 MG tablet  Commonly known as:  TYLENOL     enoxaparin 120 MG/0.8ML injection  Commonly known as:  LOVENOX  Replaced by:  enoxaparin 40 MG/0.4ML injection      TAKE these medications  allopurinol 100 MG tablet  Commonly known as:  ZYLOPRIM  TAKE 1 TABLET BY MOUTH EVERY DAY     atorvastatin 20 MG tablet  Commonly known as:  LIPITOR  TAKE 1 TABLET BY MOUTH EVERY DAY     carvedilol 12.5 MG tablet  Commonly known as:  COREG  Take 1 tablet (12.5 mg total) by mouth 2 (two) times daily with a meal.     docusate sodium 100 MG capsule  Commonly known as:  COLACE  Take 1 capsule (100 mg total) by mouth 2 (two) times daily.     enoxaparin 40 MG/0.4ML injection  Commonly known as:  LOVENOX  Inject 0.4 mLs (40 mg total) into the skin every 12 (twelve) hours.     ferrous sulfate 325 (65 FE) MG tablet  Take 1 tablet (325 mg total) by mouth 3 (three) times daily after meals.     furosemide 80 MG tablet  Commonly known as:  LASIX  Take 1 tablet (80 mg total) by mouth 2 (two) times daily.     HYDROcodone-acetaminophen 7.5-325 MG per tablet  Commonly known as:  NORCO  Take 1-2 tablets by mouth every 4 (four) hours as needed for moderate pain.     lisinopril 40 MG tablet  Commonly known as:  PRINIVIL,ZESTRIL  TAKE 1 TABLET BY MOUTH DAILY     metolazone 2.5 MG tablet  Commonly known as:  ZAROXOLYN  TAKE 1 TABLET BY MOUTH EVERY DAY     polyethylene glycol packet  Commonly known as:  MIRALAX / GLYCOLAX  Take 17 g by mouth 2 (two) times daily.     potassium chloride SA 20 MEQ tablet  Commonly known as:  K-DUR,KLOR-CON  Take 100 mEq  by mouth 2 (two) times daily. 5 tablets, twice daily     pravastatin 40 MG tablet  Commonly known as:  PRAVACHOL  Take 40 mg by mouth daily.     sitaGLIPtin 100 MG tablet  Commonly known as:  JANUVIA  Take 1 tablet (100 mg total) by mouth daily.     tamsulosin 0.4 MG Caps capsule  Commonly known as:  FLOMAX  TAKE ONE CAPSULE BY MOUTH TWICE DAILY     warfarin 5 MG tablet  Commonly known as:  COUMADIN  Take 1 tablet by mouth daily or as directed by coumadin clinic         Signed: Anastasio Auerbach. Armenta Erskin   PA-C  02/18/2015, 4:01 PM

## 2015-02-18 NOTE — Consult Note (Signed)
Cardiologist:  [name] Reason for Consult: Hypokalemia Referring Physician:   [name] is an 81 y.o. male.  HPI:   The patient is an 81yo male with a history of nonischemic cardiomyopathy as well as atrial fibrillation. Note, he had a catheterization in January 2006 that showed no coronary artery disease and an ejection fraction of 40%. He had a Myoview performed last on May 04, 2008, that showed an ejection fraction of 31%. There was felt to be a prior inferior infarct with mild peri-infarct ischemia. Dr. [name] reviewed this and felt there was a low risk and we have been treating medically. He does have permanent atrial fibrillation as well. On coumadin.  Holter monitor in August of 2011 showed mildly reduced rate and we decreased his Coreg. His last echocardiogram was performed in August of 2013. His ejection fraction was 30-35%. There was mild to moderate mitral regurgitation. The left atrium was mild to moderately dilated as was the right atrium. In November of 2013 the patient had a biventricular ICD placed.   He was admitted and underwent left total knee replacement.  We are asked to see for hypokalemia-2.7.  SP four doses of 168meq which is home dose(100BID).  He has no specific complaints.  The patient currently denies nausea, vomiting, fever, chest pain, shortness of breath, orthopnea, dizziness, PND, cough, congestion, abdominal pain, hematochezia, melena, lower extremity edema.   Past Medical History  Diagnosis Date  . CHF (congestive heart failure)   . HTN (hypertension)   . OA (osteoarthritis)   . BPH (benign prostatic hyperplasia)   . Obesity   . Anemia   . Diabetes mellitus type II   . Atrial fibrillation   . Cardiomyopathy   . Hyperlipidemia   . Peptic ulcer   . Bradycardia   . History of colonoscopy   . ICD (implantable cardiac defibrillator) in place 10/14/2012    biventricular  . GERD (gastroesophageal reflux disease)     Past Surgical History    Procedure Laterality Date  . L-spine  1965  . Total knee arthroplasty  1997    right  . Lumbar l4-5 & s1  02/2000  . Esophagogastroduodenoscopy  06/24/2002  . Ep implantable device Left   . Total knee arthroplasty Left 02/15/2015    Procedure: LEFT TOTAL KNEE ARTHROPLASTY;  Surgeon: Paralee Cancel, MD;  Location: [facility];  Service: Orthopedics;  Laterality: Left;    Family History  Problem Relation Age of Onset  . Cancer Neg Hx   . Heart disease Father   . Heart disease Mother   . Diabetes Mother   . Hypertension Brother   . Hypertension Brother     Social History:  reports that he has never smoked. He has never used smokeless tobacco. He reports that he does not drink alcohol or use illicit drugs.  Allergies: No Known Allergies  Medications:  Scheduled Meds: . allopurinol  100 mg Oral Daily  . atorvastatin  20 mg Oral Daily  . carvedilol  12.5 mg Oral BID WC  . celecoxib  200 mg Oral Q12H  . docusate sodium  100 mg Oral BID  . enoxaparin (LOVENOX) injection  40 mg Subcutaneous Q12H  . ferrous sulfate  325 mg Oral TID PC  . furosemide  80 mg Oral BID  . HYDROcodone-acetaminophen  1-2 tablet Oral Q4H  . insulin aspart  0-15 Units Subcutaneous TID WC  . metolazone  2.5 mg Oral Daily  . polyethylene glycol  17 g Oral BID  .  potassium chloride  10 mEq Intravenous Q1 Hr x 4  . potassium chloride SA  100 mEq Oral BID  . pravastatin  40 mg Oral Daily  . tamsulosin  0.4 mg Oral BID  . warfarin  5 mg Oral ONCE-1800  . Warfarin - Pharmacist Dosing Inpatient   Does not apply q1800   Continuous Infusions: . sodium chloride 0.9 % 1,000 mL with potassium chloride 10 mEq infusion 20 mL/hr at 02/18/15 1038   PRN Meds:.alum & mag hydroxide-simeth, bisacodyl, diphenhydrAMINE, HYDROmorphone (DILAUDID) injection, menthol-cetylpyridinium **OR** phenol, methocarbamol **OR** methocarbamol (ROBAXIN)  IV, metoCLOPramide **OR** metoCLOPramide (REGLAN) injection, ondansetron **OR** ondansetron  (ZOFRAN) IV   Results for orders placed or performed during the hospital encounter of 02/15/15 (from the past 48 hour(s))  Glucose, capillary     Status: Abnormal   Collection Time: 02/16/15  5:17 PM  Result Value Ref Range   Glucose-Capillary 149 (H) 70 - 99 mg/dL  Glucose, capillary     Status: Abnormal   Collection Time: 02/16/15  9:56 PM  Result Value Ref Range   Glucose-Capillary 177 (H) 70 - 99 mg/dL  CBC     Status: Abnormal   Collection Time: 02/17/15  5:35 AM  Result Value Ref Range   WBC 11.9 (H) 4.0 - 10.5 K/uL   RBC 3.70 (L) 4.22 - 5.81 MIL/uL   Hemoglobin 11.6 (L) 13.0 - 17.0 g/dL   HCT 34.2 (L) 39.0 - 52.0 %   MCV 92.4 78.0 - 100.0 fL   MCH 31.4 26.0 - 34.0 pg   MCHC 33.9 30.0 - 36.0 g/dL   RDW 14.0 11.5 - 15.5 %   Platelets 120 (L) 150 - 400 K/uL  Basic metabolic panel     Status: Abnormal   Collection Time: 02/17/15  5:35 AM  Result Value Ref Range   Sodium 135 135 - 145 mmol/L   Potassium 2.7 (LL) 3.5 - 5.1 mmol/L    Comment: CRITICAL RESULT CALLED TO, READ BACK BY AND VERIFIED WITH: D. FRANKLIN RN AT 0720 ON 03.10.16 BY SHUEA    Chloride 96 96 - 112 mmol/L   CO2 29 19 - 32 mmol/L   Glucose, Bld 173 (H) 70 - 99 mg/dL   BUN 39 (H) 6 - 23 mg/dL   Creatinine, Ser 1.28 0.50 - 1.35 mg/dL   Calcium 8.5 8.4 - 10.5 mg/dL   GFR calc non Af Amer 50 (L) >90 mL/min   GFR calc Af Amer 58 (L) >90 mL/min    Comment: (NOTE) The eGFR has been calculated using the CKD EPI equation. This calculation has not been validated in all clinical situations. eGFR's persistently <90 mL/min signify possible Chronic Kidney Disease.    Anion gap 10 5 - 15  Glucose, capillary     Status: Abnormal   Collection Time: 02/17/15  7:24 AM  Result Value Ref Range   Glucose-Capillary 161 (H) 70 - 99 mg/dL  Protime-INR     Status: Abnormal   Collection Time: 02/17/15  7:52 AM  Result Value Ref Range   Prothrombin Time 18.2 (H) 11.6 - 15.2 seconds   INR 1.49 0.00 - 1.49  Glucose,  capillary     Status: Abnormal   Collection Time: 02/17/15 12:16 PM  Result Value Ref Range   Glucose-Capillary 160 (H) 70 - 99 mg/dL  Glucose, capillary     Status: Abnormal   Collection Time: 02/17/15  5:01 PM  Result Value Ref Range   Glucose-Capillary 143 (H) 70 - 99  mg/dL  Glucose, capillary     Status: Abnormal   Collection Time: 02/17/15 10:19 PM  Result Value Ref Range   Glucose-Capillary 158 (H) 70 - 99 mg/dL  Protime-INR     Status: Abnormal   Collection Time: 02/18/15  5:31 AM  Result Value Ref Range   Prothrombin Time 22.2 (H) 11.6 - 15.2 seconds   INR 1.92 (H) 0.00 - 1.49  CBC     Status: Abnormal   Collection Time: 02/18/15  5:31 AM  Result Value Ref Range   WBC 11.6 (H) 4.0 - 10.5 K/uL   RBC 3.52 (L) 4.22 - 5.81 MIL/uL   Hemoglobin 11.1 (L) 13.0 - 17.0 g/dL   HCT 32.7 (L) 39.0 - 52.0 %   MCV 92.9 78.0 - 100.0 fL   MCH 31.5 26.0 - 34.0 pg   MCHC 33.9 30.0 - 36.0 g/dL   RDW 14.3 11.5 - 15.5 %   Platelets 117 (L) 150 - 400 K/uL    Comment: CONSISTENT WITH PREVIOUS RESULT  Basic metabolic panel     Status: Abnormal   Collection Time: 02/18/15  5:31 AM  Result Value Ref Range   Sodium 139 135 - 145 mmol/L   Potassium 2.7 (LL) 3.5 - 5.1 mmol/L    Comment: CRITICAL RESULT CALLED TO, READ BACK BY AND VERIFIED WITH: M. BABBISH PA AT 8676 ON 03.11.16 BY SHUEA    Chloride 94 (L) 96 - 112 mmol/L   CO2 33 (H) 19 - 32 mmol/L   Glucose, Bld 157 (H) 70 - 99 mg/dL   BUN 44 (H) 6 - 23 mg/dL   Creatinine, Ser 1.20 0.50 - 1.35 mg/dL   Calcium 8.4 8.4 - 10.5 mg/dL   GFR calc non Af Amer 54 (L) >90 mL/min   GFR calc Af Amer 62 (L) >90 mL/min    Comment: (NOTE) The eGFR has been calculated using the CKD EPI equation. This calculation has not been validated in all clinical situations. eGFR's persistently <90 mL/min signify possible Chronic Kidney Disease.    Anion gap 12 5 - 15  Glucose, capillary     Status: Abnormal   Collection Time: 02/18/15  7:31 AM  Result Value  Ref Range   Glucose-Capillary 190 (H) 70 - 99 mg/dL   Comment 1 Notify RN     No results found.  Review of Systems  All other systems reviewed and are negative.  Blood pressure 133/60, pulse 63, temperature 97.3 F (36.3 C), temperature source Oral, resp. rate 16, height 5' 8.5" (1.74 m), weight 266 lb (120.657 kg), SpO2 97 %. Physical Exam  Nursing note and vitals reviewed. Constitutional: He is oriented to person, place, and time. He appears well-developed and well-nourished. No distress.  HENT:  Head: Normocephalic and atraumatic.  Eyes: EOM are normal. Pupils are equal, round, and reactive to light. Scleral icterus is present.  Neck: Normal range of motion. Neck supple. No JVD present.  Cardiovascular: Normal rate, S1 normal and S2 normal.  An irregularly irregular rhythm present.  No murmur heard. Pulses:      Radial pulses are 2+ on the right side, and 2+ on the left side.       Dorsalis pedis pulses are 2+ on the right side, and 2+ on the left side.  Respiratory: Effort normal and breath sounds normal. He has no wheezes. He has no rales.  GI: Soft. Bowel sounds are normal. He exhibits no distension.  Musculoskeletal: He exhibits no edema.  Lymphadenopathy:  He has no cervical adenopathy.  Neurological: He is alert and oriented to person, place, and time. He exhibits normal muscle tone.  Skin: Skin is warm and dry.  Psychiatric: He has a normal mood and affect.    Assessment/Plan:   S/P left TKA   Atrial fibrillation  Rate controlled.  No med changes   Congestive heart failure  KVO fluids.     Hypokalemia Hold PO K+ right now(Continue home dose at DC)  and start 4 runs of IV, 55mq.  KVO current fluids.  Recheck K at 1500 hrs.  He is stable.     HTarri Fuller [facility]3/10/2015, 10:42 AM   Patient seen with PA, agree with the above note.  Patient has history of cardiomyopathy (EF 30-35%) and chronic atrial fibrillation.  He is now s/p left TKR.  We were called to  evaluate because of hypokalemia.   1. Hypokalemia: Patient is getting metolazone daily and Lasix bid.  This will predispose to significant hypokalemia.  Would give his usual home dose of KCl 100 mEq bid and supplement as needed.  Getting 40 mEq IV KCl and will recheck BMET in pm today.  Check BMET daily.  2. Chronic systolic CHF: EF 351-89%  Mildly elevated JVP on exam today, 1+ ankle edema.  He is on Lasix 80 mg bid at home and takes metolazone 2.5 daily as well at home (I confirmed this with him).  Would continue his home diuretics and Coreg for now. 3. Atrial fibrillation: Chronic, stable.  Rate-controlled with Coreg, back on warfarin now.   DLoralie Champagne3/10/2015 1:03 PM

## 2015-02-18 NOTE — Progress Notes (Signed)
[facility] will accept pt on SAT. if stable for d/c. Weekend CSW will assist with d/c planning to SNF. Pt / daughter / PA / NSG aware of d/c plan.   Cori Razor LCSW [phone]

## 2015-02-18 NOTE — Progress Notes (Signed)
ANTICOAGULATION CONSULT NOTE   Pharmacy Consult for Warfarin Indication: atrial fibrillation  No Known Allergies  Patient Measurements: Height: 5' 8.5" (174 cm) Weight: 266 lb (120.657 kg) IBW/kg (Calculated) : 69.55  Vital Signs: Temp: 97.3 F (36.3 C) (03/11 0551) Temp Source: Oral (03/11 0551) BP: 133/60 mmHg (03/11 0551) Pulse Rate: 63 (03/11 0551)  Labs:  Recent Labs  02/16/15 0506 02/17/15 0535 02/17/15 0752 02/18/15 0531  HGB 11.7* 11.6*  --  11.1*  HCT 35.0* 34.2*  --  32.7*  PLT 105* 120*  --  117*  LABPROT 15.6*  --  18.2* 22.2*  INR 1.23  --  1.49 1.92*  CREATININE 0.96 1.28  --  1.20    Estimated Creatinine Clearance: 58.3 mL/min (by C-G formula based on Cr of 1.2).   Medical History: Past Medical History  Diagnosis Date  . CHF (congestive heart failure)   . HTN (hypertension)   . OA (osteoarthritis)   . BPH (benign prostatic hyperplasia)   . Obesity   . Anemia   . Diabetes mellitus type II   . Atrial fibrillation   . Cardiomyopathy   . Hyperlipidemia   . Peptic ulcer   . Bradycardia   . History of colonoscopy   . ICD (implantable cardiac defibrillator) in place 10/14/2012    biventricular  . GERD (gastroesophageal reflux disease)     Medications:  Scheduled:  . allopurinol  100 mg Oral Daily  . atorvastatin  20 mg Oral Daily  . carvedilol  12.5 mg Oral BID WC  . celecoxib  200 mg Oral Q12H  . docusate sodium  100 mg Oral BID  . enoxaparin (LOVENOX) injection  40 mg Subcutaneous Q12H  . ferrous sulfate  325 mg Oral TID PC  . furosemide  80 mg Oral BID  . HYDROcodone-acetaminophen  1-2 tablet Oral Q4H  . insulin aspart  0-15 Units Subcutaneous TID WC  . metolazone  2.5 mg Oral Daily  . polyethylene glycol  17 g Oral BID  . potassium chloride SA  100 mEq Oral BID  . pravastatin  40 mg Oral Daily  . tamsulosin  0.4 mg Oral BID  . Warfarin - Pharmacist Dosing Inpatient   Does not apply q1800   Infusions:  . sodium chloride 0.9 %  1,000 mL with potassium chloride 10 mEq infusion 75 mL/hr at 02/17/15 1015    Assessment:  86 yr obese male on warfarin PTA for h/o AFib underwent left TKA 02/15/15  Home regimen of warfarin =  daily except 2.5mg  on Thursday (per clinic notes).  Last dose of warfarin was taken on 3/3.  INR checked on 3/1 was therapeutic (2.35). Patient underwent bridging with Lovenox up until surgery.  Pharmacy asked to resume warfarin dosing  MD bridging with Lovenox  BID post-op   Goal of Therapy:  INR 2-3   Today, 02/18/2015: INR responding appropriately after 3 doses Lovenox 40 mg SQ q12h began yesterday per ortho orders. H/H down slightly consistent with ABLA. Pltc slightly low No unexpected bleeding reported.  Plan: 1. Warfarin  X 1 tonight @ 1800 (continue home regiment given visible response in INR) 2. Continue Lovenox 40 mg SQ q12h per ortho orders. 3. Follow CBC, watch pltc 4. PT/INR daily while inpatient. 5. When patient goes to SNF will need close INR monitoring.  Suggest checking INR at least twice a week until therapeutic and stable, then at least once a week until discharged from SNF and follow-up care is resumed at anticoagulation  clinic.  Arley Phenix RPh 02/18/2015, 9:56 AM Pager [phone]

## 2015-02-18 NOTE — Progress Notes (Signed)
Physical Therapy Treatment Patient Details Name: [name] MRN: [id number] DOB: Oct 16, 1929 Today's Date: 02/18/2015    History of Present Illness 81 yo male s/p L TKA. Hx of DM, neuropathy    PT Comments    Progressing with mobility. Plan is for d/c to [facility] on today. Ready to d/c from PT standpoint.   Follow Up Recommendations  SNF     Equipment Recommendations  None recommended by PT    Recommendations for Other Services       Precautions / Restrictions Precautions Precautions: Fall Restrictions Weight Bearing Restrictions: No LLE Weight Bearing: Weight bearing as tolerated    Mobility  Bed Mobility Overal bed mobility: Needs Assistance Bed Mobility: Supine to Sit     Supine to sit: Min assist     General bed mobility comments: Assist for L LE. Increased time  Transfers Overall transfer level: Needs assistance Equipment used: Rolling walker (2 wheeled) Transfers: Sit to/from Stand Sit to Stand: Min assist;From elevated surface         General transfer comment: Assist to rise, stabilize, control descent. VCs for safety, technique, hand placement.   Ambulation/Gait Ambulation/Gait assistance: Min guard Ambulation Distance (Feet): 100 Feet Assistive device: Rolling walker (2 wheeled) Gait Pattern/deviations: Step-to pattern;Decreased stride length;Trunk flexed;Antalgic     General Gait Details: close guard for safety. VCS safety, posture, distance from RW, sequence. slow gait speed   Stairs            Wheelchair Mobility    Modified Rankin (Stroke Patients Only)       Balance                                    Cognition Arousal/Alertness: Awake/alert Behavior During Therapy: WFL for tasks assessed/performed Overall Cognitive Status: Within Functional Limits for tasks assessed                      Exercises Total Joint Exercises Ankle Circles/Pumps: AROM;Both;10 reps;Supine Quad Sets: AROM;Both;10  reps;Supine Heel Slides: AAROM;Left;10 reps;Supine Hip ABduction/ADduction: AAROM;Left;10 reps;Supine Straight Leg Raises: AAROM;Left;10 reps;Supine Goniometric ROM: ~10-50 degrees. slow progress with ROM.    General Comments        Pertinent Vitals/Pain Pain Assessment: 0-10 Pain Score: 3  Pain Location: L knee, thigh Pain Descriptors / Indicators: Aching;Sore Pain Intervention(s): Monitored during session;Repositioned;Ice applied    Home Living                      Prior Function            PT Goals (current goals can now be found in the care plan section) Progress towards PT goals: Progressing toward goals    Frequency  7X/week    PT Plan Current plan remains appropriate    Co-evaluation             End of Session Equipment Utilized During Treatment: Gait belt Activity Tolerance: Patient tolerated treatment well Patient left: in chair;with call bell/phone within reach     Time: 0925-0948 PT Time Calculation (min) (ACUTE ONLY): 23 min  Charges:  $Gait Training: 8-22 mins $Therapeutic Exercise: 8-22 mins                    G Codes:      Rebeca Alert, MPT Pager: [phone]

## 2015-02-19 DIAGNOSIS — Z471 Aftercare following joint replacement surgery: Secondary | ICD-10-CM | POA: Diagnosis not present

## 2015-02-19 DIAGNOSIS — I482 Chronic atrial fibrillation: Secondary | ICD-10-CM | POA: Diagnosis not present

## 2015-02-19 DIAGNOSIS — E1151 Type 2 diabetes mellitus with diabetic peripheral angiopathy without gangrene: Secondary | ICD-10-CM | POA: Diagnosis not present

## 2015-02-19 DIAGNOSIS — Z95810 Presence of automatic (implantable) cardiac defibrillator: Secondary | ICD-10-CM | POA: Diagnosis not present

## 2015-02-19 DIAGNOSIS — M1712 Unilateral primary osteoarthritis, left knee: Secondary | ICD-10-CM | POA: Diagnosis not present

## 2015-02-19 DIAGNOSIS — D696 Thrombocytopenia, unspecified: Secondary | ICD-10-CM | POA: Diagnosis not present

## 2015-02-19 DIAGNOSIS — I4891 Unspecified atrial fibrillation: Secondary | ICD-10-CM | POA: Diagnosis not present

## 2015-02-19 DIAGNOSIS — R262 Difficulty in walking, not elsewhere classified: Secondary | ICD-10-CM | POA: Diagnosis not present

## 2015-02-19 DIAGNOSIS — I509 Heart failure, unspecified: Secondary | ICD-10-CM | POA: Diagnosis not present

## 2015-02-19 DIAGNOSIS — I429 Cardiomyopathy, unspecified: Secondary | ICD-10-CM | POA: Diagnosis not present

## 2015-02-19 DIAGNOSIS — I5022 Chronic systolic (congestive) heart failure: Secondary | ICD-10-CM | POA: Diagnosis not present

## 2015-02-19 DIAGNOSIS — M6281 Muscle weakness (generalized): Secondary | ICD-10-CM | POA: Diagnosis not present

## 2015-02-19 DIAGNOSIS — I10 Essential (primary) hypertension: Secondary | ICD-10-CM | POA: Diagnosis not present

## 2015-02-19 DIAGNOSIS — E876 Hypokalemia: Secondary | ICD-10-CM | POA: Diagnosis not present

## 2015-02-19 DIAGNOSIS — Z96652 Presence of left artificial knee joint: Secondary | ICD-10-CM | POA: Diagnosis not present

## 2015-02-19 DIAGNOSIS — D509 Iron deficiency anemia, unspecified: Secondary | ICD-10-CM | POA: Diagnosis not present

## 2015-02-19 LAB — BASIC METABOLIC PANEL
Anion gap: 12 (ref 5–15)
BUN: 45 mg/dL — AB (ref 6–23)
CO2: 31 mmol/L (ref 19–32)
Calcium: 8 mg/dL — ABNORMAL LOW (ref 8.4–10.5)
Chloride: 95 mmol/L — ABNORMAL LOW (ref 96–112)
Creatinine, Ser: 1.08 mg/dL (ref 0.50–1.35)
GFR, EST AFRICAN AMERICAN: 71 mL/min — AB (ref >90)
GFR, EST NON AFRICAN AMERICAN: 61 mL/min — AB (ref >90)
Glucose, Bld: 169 mg/dL — ABNORMAL HIGH (ref 70–99)
POTASSIUM: 2.8 mmol/L — AB (ref 3.5–5.1)
SODIUM: 138 mmol/L (ref 135–145)

## 2015-02-19 LAB — HEMOGLOBIN AND HEMATOCRIT, BLOOD
HCT: 31.6 % — ABNORMAL LOW (ref 39.0–52.0)
Hemoglobin: 10.8 g/dL — ABNORMAL LOW (ref 13.0–17.0)

## 2015-02-19 LAB — GLUCOSE, CAPILLARY: GLUCOSE-CAPILLARY: 167 mg/dL — AB (ref 70–99)

## 2015-02-19 LAB — PROTIME-INR
INR: 2.1 — AB (ref 0.00–1.49)
PROTHROMBIN TIME: 23.7 s — AB (ref 11.6–15.2)

## 2015-02-19 MED ORDER — WARFARIN SODIUM 5 MG PO TABS
5.0000 mg | ORAL_TABLET | Freq: Once | ORAL | Status: DC
  Filled 2015-02-19: qty 1

## 2015-02-19 NOTE — Progress Notes (Signed)
Physical Therapy Treatment Patient Details Name: RAKIEM FACTOR MRN: [id number] DOB: 12/29/1928 Today's Date: Mar 05, 2015    History of Present Illness 81 yo male s/p L TKA. Hx of DM, neuropathy    PT Comments    Pt continues motivated and progressing steadily with mobility.  Follow Up Recommendations  SNF     Equipment Recommendations  None recommended by PT    Recommendations for Other Services       Precautions / Restrictions Precautions Precautions: Fall Restrictions Weight Bearing Restrictions: No LLE Weight Bearing: Weight bearing as tolerated    Mobility  Bed Mobility Overal bed mobility: Needs Assistance Bed Mobility: Supine to Sit     Supine to sit: Min assist     General bed mobility comments: Assist for L LE. Increased time  Transfers Overall transfer level: Needs assistance Equipment used: Rolling walker (2 wheeled) Transfers: Sit to/from Stand Sit to Stand: Min assist;From elevated surface         General transfer comment: Assist to rise, stabilize, control descent. VCs for safety, technique, hand placement.   Ambulation/Gait Ambulation/Gait assistance: Min guard Ambulation Distance (Feet): 190 Feet Assistive device: Rolling walker (2 wheeled) Gait Pattern/deviations: Step-to pattern;Step-through pattern;Decreased step length - right;Decreased step length - left;Shuffle;Antalgic;Trunk flexed     General Gait Details: cues for posture, sequence and position from [employer] Mobility    Modified Rankin (Stroke Patients Only)       Balance                                    Cognition Arousal/Alertness: Awake/alert Behavior During Therapy: WFL for tasks assessed/performed Overall Cognitive Status: Within Functional Limits for tasks assessed                      Exercises Total Joint Exercises Ankle Circles/Pumps: AROM;Both;Supine;20 reps Quad Sets: AROM;Both;Supine;20  reps Heel Slides: AAROM;Left;Supine;20 reps Straight Leg Raises: Left;Supine;AAROM;AROM;20 reps    General Comments        Pertinent Vitals/Pain Pain Assessment: 0-10 Pain Score: 3  Pain Location: L knee Pain Descriptors / Indicators: Aching;Sore Pain Intervention(s): Limited activity within patient's tolerance;Monitored during session;Premedicated before session;Ice applied    Home Living                      Prior Function            PT Goals (current goals can now be found in the care plan section) Acute Rehab PT Goals Patient Stated Goal: rehab to regain indpendence PT Goal Formulation: With patient Time For Goal Achievement: 02/23/15 Potential to Achieve Goals: Good Progress towards PT goals: Progressing toward goals    Frequency  7X/week    PT Plan Current plan remains appropriate    Co-evaluation             End of Session Equipment Utilized During Treatment: Gait belt Activity Tolerance: Patient tolerated treatment well Patient left: in chair;with call bell/phone within reach     Time: 1126-1156 PT Time Calculation (min) (ACUTE ONLY): 30 min  Charges:  $Gait Training: 8-22 mins $Therapeutic Exercise: 8-22 mins                    G Codes:      Tynlee Bayle 03-05-15, 12:08 PM

## 2015-02-19 NOTE — Plan of Care (Signed)
Problem: Discharge Progression Outcomes Goal: Barriers To Progression Addressed/Resolved Outcome: Completed/Met Date Met:  02/19/15 Treating low K+ levels Goal: Discharge plan in place and appropriate Outcome: Completed/Met Date Met:  02/19/15 For SNF

## 2015-02-19 NOTE — Progress Notes (Signed)
ANTICOAGULATION CONSULT NOTE   Pharmacy Consult for Warfarin Indication: atrial fibrillation  No Known Allergies  Patient Measurements: Height: 5' 8.5" (174 cm) Weight: 266 lb (120.657 kg) IBW/kg (Calculated) : 69.55  Vital Signs: Temp: 97.6 F (36.4 C) (03/12 0630) Temp Source: Oral (03/12 0630) BP: 105/57 mmHg (03/12 0630) Pulse Rate: 67 (03/12 0630)  Labs:  Recent Labs  02/17/15 0535 02/17/15 0752 02/18/15 0531 02/19/15 0513 02/19/15 1130  HGB 11.6*  --  11.1*  --  10.8*  HCT 34.2*  --  32.7*  --  31.6*  PLT 120*  --  117*  --   --   LABPROT  --  18.2* 22.2* 23.7*  --   INR  --  1.49 1.92* 2.10*  --   CREATININE 1.28  --  1.20  --   --    Estimated Creatinine Clearance: 58.3 mL/min (by C-G formula based on Cr of 1.2).  Medications:  Scheduled:  . allopurinol  100 mg Oral Daily  . atorvastatin  20 mg Oral Daily  . carvedilol  12.5 mg Oral BID WC  . celecoxib  200 mg Oral Q12H  . docusate sodium  100 mg Oral BID  . ferrous sulfate  325 mg Oral TID PC  . furosemide  80 mg Oral BID  . HYDROcodone-acetaminophen  1-2 tablet Oral Q4H  . insulin aspart  0-15 Units Subcutaneous TID WC  . metolazone  2.5 mg Oral Daily  . polyethylene glycol  17 g Oral BID  . potassium chloride SA  100 mEq Oral BID  . tamsulosin  0.4 mg Oral BID  . Warfarin - Pharmacist Dosing Inpatient   Does not apply q1800   Assessment:  86 yr obese male on warfarin PTA for h/o AFib underwent left TKA 02/15/15  Home regimen of warfarin = 5mg  daily except 2.5mg  on Thursday (per clinic notes).  Last dose of warfarin was taken on 3/3.  INR checked on 3/1 was therapeutic (2.35). Patient underwent bridging with Lovenox up until surgery.  Pharmacy asked to resume warfarin dosing, MD bridging with Lovenox 40mg  BID post-op   Goal of Therapy:  INR 2-3   Today, 02/19/2015: INR 2/10 after 7.5, 6,6,5mg  Warfarin Lovenox discontinued with INR > 1.8 H/H down slightly consistent with ABLA. Drug  interaction with Celecoxib and Allopurinol - can increase INR Pltc slightly low (3/11), No unexpected bleeding reported. Good po intake  Plan:   Warfarin 5mg  today at 6 PM   Follow CBC, watch pltc   PT/INR daily while inpatient.   When patient goes to SNF will need close INR monitoring.  Suggest checking INR at least twice a week until therapeutic and stable, then at least once a week until discharged from SNF and follow-up care is resumed at anticoagulation clinic.  Otho Bellows PharmD Pager [phone] 02/19/2015, 12:32 PM

## 2015-02-19 NOTE — Progress Notes (Signed)
Subjective:  Tolerated knee replacement fairly well.  No complaints of shortness of breath or chest pain.  Significantly talkative  Objective:  Vital Signs in the last 24 hours: BP 105/57 mmHg  Pulse 67  Temp(Src) 97.6 F (36.4 C) (Oral)  Resp 18  Ht 5' 8.5" (1.74 m)  Wt 120.657 kg (266 lb)  BMI 39.85 kg/m2  SpO2 98%  Physical Exam: Talkative male currently in no acute distress Lungs:  Clear  Cardiac:  Regular rhythm, normal S1 and S2, no S3 Extremities:  No edema present  Intake/Output from previous day: 03/11 0701 - 03/12 0700 In: 1474.6 [P.O.:720; I.V.:454.6; IV Piggyback:300] Out: 1725 [Urine:1725] Weight Filed Weights   02/15/15 1350  Weight: 120.657 kg (266 lb)    Lab Results: Basic Metabolic Panel:  Recent Labs  18/56/31 0535 02/18/15 0531 02/18/15 1600  NA 135 139  --   K 2.7* 2.7* 3.3*  CL 96 94*  --   CO2 29 33*  --   GLUCOSE 173* 157*  --   BUN 39* 44*  --   CREATININE 1.28 1.20  --     CBC:  Recent Labs  02/17/15 0535 02/18/15 0531  WBC 11.9* 11.6*  HGB 11.6* 11.1*  HCT 34.2* 32.7*  MCV 92.4 92.9  PLT 120* 117*    BNP No results found for: BNP  PROTIME: Lab Results  Component Value Date   INR 2.10* 02/19/2015   INR 1.92* 02/18/2015   INR 1.49 02/17/2015   Assessment/Plan:  1.  Hypokalemia due to oral diuretics-potassium is pending today.  I think he can go ahead and be discharged to rehabilitation and they can continue to monitor and adjust potassium as an outpatient.  One might consider whether to add spironolactone to help with his potassium requirements but I would defer this to his primary team as an outpatient. 2.  Cardiomyopathy-currently compensated 3.  Chronic atrial fibrillation 4.  Functioning biventricular device     W. Ashley Royalty  MD [facility]  02/19/2015, 8:57 AM

## 2015-02-19 NOTE — Clinical Social Work Placement (Signed)
Clinical Social Work Department CLINICAL SOCIAL WORK PLACEMENT NOTE 02/19/2015  Patient:  DEMERE, CAPEL  Account Number:  [id number] Admit date:  02/15/2015  Clinical Social Worker:  Cori Razor, LCSW  Date/time:  02/16/2015 08:09 AM  Clinical Social Work is seeking post-discharge placement for this patient at the following level of care:   SKILLED NURSING   (*CSW will update this form in Epic as items are completed)   02/16/2015  Patient/family provided with [facility] Department of Clinical Social Work's list of facilities offering this level of care within the geographic area requested by the patient (or if unable, by the patient's family).  02/16/2015  Patient/family informed of their freedom to choose among providers that offer the needed level of care, that participate in Medicare, Medicaid or managed care program needed by the patient, have an available bed and are willing to accept the patient.    Patient/family informed of [facility]' ownership interest in [facility], as well as of the fact that they are under no obligation to receive care at this facility.  PASARR submitted to EDS on 02/15/2015 PASARR number received on 02/15/2015  FL2 transmitted to all facilities in geographic area requested by pt/family on  02/16/2015 FL2 transmitted to all facilities within larger geographic area on   Patient informed that his/her managed care company has contracts with or will negotiate with  certain facilities, including the following:     Patient/family informed of bed offers received:  02/18/2015 Patient chooses bed at [facility] Physician recommends and patient chooses bed at    Patient to be transferred to [facility] on  02/19/2015 Patient to be transferred to facility by Daughter/Deborah Patient and family notified of transfer on 02/19/2015 Name of family member notified:  [name]  The following physician  request were entered in Epic:   Additional Comments:  .Elray Buba, LCSW [facility] Clinical Social Worker - Weekend Coverage cell #: [phone]

## 2015-02-19 NOTE — Progress Notes (Addendum)
Report called to receiving nurse at [facility]. Discharged from floor via w/c, belongings & daughter with pt, transporting to [facility] for rehab. No changes in assessment. Jayton Popelka, [employer]

## 2015-02-19 NOTE — Discharge Instructions (Signed)
WBAT  Do not remove dressing unless it appears compromised You may shower with dressing on Shower only, no tub bath. Call if any temperatures greater than 101 or any wound complications: [phone]

## 2015-02-19 NOTE — Progress Notes (Signed)
Subjective: 4 Days Post-Op Procedure(s) (LRB): LEFT TOTAL KNEE ARTHROPLASTY (Left) Patient reports pain as 2 on 0-10 scale. Last Potassium 3.3,will repeat now. Ready for DC.   Objective: Vital signs in last 24 hours: Temp:  [97.6 F (36.4 C)-98.1 F (36.7 C)] 97.6 F (36.4 C) (03/12 0630) Pulse Rate:  [64-106] 67 (03/12 0630) Resp:  [16-18] 18 (03/12 0630) BP: (105-113)/(57-67) 105/57 mmHg (03/12 0630) SpO2:  [97 %-99 %] 98 % (03/12 0630)  Intake/Output from previous day: 03/11 0701 - 03/12 0700 In: 1474.6 [P.O.:720; I.V.:454.6; IV Piggyback:300] Out: 1725 [Urine:1725] Intake/Output this shift:     Recent Labs  02/17/15 0535 02/18/15 0531  HGB 11.6* 11.1*    Recent Labs  02/17/15 0535 02/18/15 0531  WBC 11.9* 11.6*  RBC 3.70* 3.52*  HCT 34.2* 32.7*  PLT 120* 117*    Recent Labs  02/17/15 0535 02/18/15 0531 02/18/15 1600  NA 135 139  --   K 2.7* 2.7* 3.3*  CL 96 94*  --   CO2 29 33*  --   BUN 39* 44*  --   CREATININE 1.28 1.20  --   GLUCOSE 173* 157*  --   CALCIUM 8.5 8.4  --     Recent Labs  02/18/15 0531 02/19/15 0513  INR 1.92* 2.10*    No cellulitis present  Assessment/Plan: 4 Days Post-Op Procedure(s) (LRB): LEFT TOTAL KNEE ARTHROPLASTY (Left) Discharge to SNF  Stanley Helmuth A 02/19/2015, 8:54 AM

## 2015-02-21 ENCOUNTER — Encounter: Payer: Self-pay | Admitting: Internal Medicine

## 2015-02-21 ENCOUNTER — Non-Acute Institutional Stay (SKILLED_NURSING_FACILITY): Payer: Medicare Other | Admitting: Internal Medicine

## 2015-02-21 DIAGNOSIS — I482 Chronic atrial fibrillation, unspecified: Secondary | ICD-10-CM

## 2015-02-21 DIAGNOSIS — I5022 Chronic systolic (congestive) heart failure: Secondary | ICD-10-CM | POA: Diagnosis not present

## 2015-02-21 DIAGNOSIS — E876 Hypokalemia: Secondary | ICD-10-CM | POA: Diagnosis not present

## 2015-02-21 DIAGNOSIS — M1712 Unilateral primary osteoarthritis, left knee: Secondary | ICD-10-CM

## 2015-02-21 NOTE — Progress Notes (Signed)
Patient ID: [name], male   DOB: 1929/11/23, 81 y.o.   MRN: [id number]   This is an acute visit.  Level care skilled.  Facility [facility].  Chief complaint-acute visit status post hospitalization for left knee replacement secondary to end-stage osteoarthritis-follow-up hypokalemia CHF.  History of present illness.  Patient is a pleasant 81 year old male who recently underwent a left total knee arthroplasty secondary to severe end-stage osteoarthritis of the left knee.  Apparently he tolerated the procedure well.  He is on Lovenox as well as Coumadin for anticoagulation he does have a history of atrial fibrillation.  Patient does have a history of CHF with a reduced ejection fraction most recently 30-35 percent-he has an aggressive diuretic regimen he was discharged on Lasix 80 mg twice a day as well as Zaroxolyn 2.5 mg a day along with 100  meq of potassium twice a day,  It appears his potassium was fairly low in the hospital and this was being supplemented aggressively-potassium on discharge on March 12 was 2.8-on call provider was notified and did hold the Lasix and started Aldactone 25 mg a day in place of the Lasix-also ordered a metabolic panel for today which is pending.  Patient appears to be stable vital signs are stable he has no acute complaints.--Denies any shortness breath or cough-states he has not been urinating as often since the Aldactone started-  Previous medical history.  History of CHF.  Atrial fibrillation.  Status post knee replacement.  BPH.  Obesity 8.  Anemia.  Diabetes type 2.  Hyperlipidemia.  Peptic ulcer.  History of ICD implant.  GERD.    Medications.  Allopurinol 1 mg daily.  Lipitor 20 mg daily.  Coreg 12.5 mg twice a day.  Colace 100 mg twice a day.  Lovenox 40 mg daily.--This is been discontinued secondary to therapeutic INR  Ferrous sulfate 325 mg 3 times a day.  Aldactone 25 mg daily  Norco 7. 04/11/2024  milligrams every 4 hours when necessary.  Lisinopril 40 mg daily.  Zaroxolyn 2.5 mg every other day.  MiraLAX twice a day.  Potassium 100 mEq twice a day.  Pravastatin 40 mg daily.  Januvia 100 mg daily.  Flomax 0.5 mg twice a day.  Coumadin 5 mg by mouth daily.  Family medical social history reviewed per discharge note on 02/19/2015.  Review of systems.  In general does not complaining of any fever or chills.  Skin is not complaining of any rashes or itching surgical sites appear fairly benign.  Head ears eyes nose mouth and throat does not complain of any visual changes or sore throat.  Respiratory no shortness of breath or cough.  Cardiac does not complain of chest pain does have baseline edema lower extremity.  GI does not complain of any abdominal discomfort nausea or vomiting diarrhea or constipation.  Musculoskeletal joint pain at this point appears to be controlled the knee pain appears to be controlled.  GU does not complain of dysuria.  Neurologic is not complaining of dizziness headache or numbness currently.  Psych is not complaining of anxiety or depressive symptoms.  Physical exam.  Temperature is 97.3 pulse 78 respirations 18 blood pressure 140/70-129/60.  In general this is a pleasant elderly male in no distress sitting comfortably in his wheelchair.  Skin is warm and dry left knee surgical site is covered with dressing per orthopedics I do not see any surrounding erythema or drainage there is some postop warmth which appears to be baseline with what  one would expect.  Eyes pupils appear reactive light sclera and conjunctiva are clear visual acuity appears grossly intact.  Oropharynx is clear mucous membranes are moist.  Chest is clear to auscultation except for some mild bronchial sounds lower left base there is no labored breathing.  Heart is regular rate and rhythm he does have lower extremity edema on left greater than right per his daughter  this is baseline to somewhat improve pedal pulses are intact.  Abdomen is obese soft nontender with positive bowel sounds.  Muscle skeletal upper extremity strength appears to be intact again does have recent left knee replacement with limited lability is able to move his right leg it appears although this is a somewhat limited exam since patient is in wheelchair strength does appear to be intact as well as antigravity strength.  Neurologic is grossly intact no lateralizing findings her speech is clear.  Psych he appears alert and oriented pleasant and appropriate.  Labs.  02/19/2015.  Sodium 138 potassium 2.8 BUN 45 creatinine 1.08.  Hemoglobin 10.8.  INR 2.1.  02/18/2015.  WBC 11.8 hemoglobin 11.1 platelets 117.  I do noet March 13 INR 2.8 again Lovenox has been discontinued  Assessment and plan.  #1 history of left knee replacement-this is followed by orthopedics as appears to be stable he is on Coumadin for anticoagulation INR is therapeutic at 2.8 will check an INR -pain appears to be controlled on the Norco.  #2-history CHF he is on Zaroxolyn every other day-his Aldactone was started in place of Lasix secondary to low potassium-he has very aggressive potassium supplementation at 100 mEq twice a day-this will have to be followed closely he will need an updated metabolic panel stat today to make sure potassium is stable-againpotassiunlevel we have is low-clinically he appears stable in this regards. He also continues on a beta blocker.  #3 hypertension-this appears relatively stable this will have to be monitored he is on Coreg-as well as lisinopril-as well as previously mentioned diuretics.  #4-history of anemia suspicion of postop believe-will update a CBC he is on iron.  #5-history of hyperlipidemia he continues on a statin since patient's stay here is quite short I suspect we will not be aggressive with pursuing a lipid panel.  #6-history diabetes 2 he is on Januvia-we  will have to monitor his blood sugars for now before meals and at bedtime call provider if less than 60 or greater than 300.  #7-atrial fibrillation-this appears rate controlled he is on a beta blocker as well as Coumadin for anticoagulation-INR appears to be therapeutic  [id number]-of note greater than 40 minutes spent assessing patient-reviewing his chart-discussing patient's status with nursing staff as well as with his daughter at bedside-and coordinating and formulating a plan of care for numerous diagnoses-of note greater than 50% of time spent coordinating plan of care

## 2015-02-22 ENCOUNTER — Non-Acute Institutional Stay (SKILLED_NURSING_FACILITY): Payer: Medicare Other | Admitting: Internal Medicine

## 2015-02-22 DIAGNOSIS — Z95810 Presence of automatic (implantable) cardiac defibrillator: Secondary | ICD-10-CM | POA: Diagnosis not present

## 2015-02-22 DIAGNOSIS — E876 Hypokalemia: Secondary | ICD-10-CM

## 2015-02-22 DIAGNOSIS — D509 Iron deficiency anemia, unspecified: Secondary | ICD-10-CM | POA: Diagnosis not present

## 2015-02-22 DIAGNOSIS — I482 Chronic atrial fibrillation, unspecified: Secondary | ICD-10-CM

## 2015-02-22 DIAGNOSIS — D696 Thrombocytopenia, unspecified: Secondary | ICD-10-CM | POA: Diagnosis not present

## 2015-02-22 DIAGNOSIS — E1151 Type 2 diabetes mellitus with diabetic peripheral angiopathy without gangrene: Secondary | ICD-10-CM

## 2015-02-22 DIAGNOSIS — Z96652 Presence of left artificial knee joint: Secondary | ICD-10-CM | POA: Diagnosis not present

## 2015-02-22 DIAGNOSIS — I5022 Chronic systolic (congestive) heart failure: Secondary | ICD-10-CM

## 2015-02-22 NOTE — Progress Notes (Signed)
MRN: [id number] Name: [name]  Sex: male Age: 81 y.o. DOB: 1929-10-29  [facility] #: [facility] Facility/Room:513 Level Of Care: SNF Provider: Merrilee Seashore D Emergency Contacts: Extended Emergency Contact Information Primary Emergency Contact: Sammuel Cooper, [place] Home Phone: [phone] Mobile Phone: [phone] Relation: Daughter  Code Status: FULL  Allergies: Review of patient's allergies indicates no known allergies.  Chief Complaint  Patient presents with  . New Admit To SNF    HPI: Patient is 81 y.o. male who is admitted to SNF for OT/PT after L knee arthroplasty.  Past Medical History  Diagnosis Date  . CHF (congestive heart failure)   . HTN (hypertension)   . OA (osteoarthritis)   . BPH (benign prostatic hyperplasia)   . Obesity   . Anemia   . Diabetes mellitus type II   . Atrial fibrillation   . Cardiomyopathy   . Hyperlipidemia   . Peptic ulcer   . Bradycardia   . History of colonoscopy   . ICD (implantable cardiac defibrillator) in place 10/14/2012    biventricular  . GERD (gastroesophageal reflux disease)     Past Surgical History  Procedure Laterality Date  . L-spine  1965  . Total knee arthroplasty  1997    right  . Lumbar l4-5 & s1  02/2000  . Esophagogastroduodenoscopy  06/24/2002  . Ep implantable device Left   . Total knee arthroplasty Left 02/15/2015    Procedure: LEFT TOTAL KNEE ARTHROPLASTY;  Surgeon: Durene Romans, MD;  Location: [facility];  Service: Orthopedics;  Laterality: Left;      Medication List       This list is accurate as of: 02/22/15 11:59 PM.  Always use your most recent med list.               allopurinol 100 MG tablet  Commonly known as:  ZYLOPRIM  TAKE 1 TABLET BY MOUTH EVERY DAY     atorvastatin 20 MG tablet  Commonly known as:  LIPITOR  TAKE 1 TABLET BY MOUTH EVERY DAY     carvedilol 12.5 MG tablet  Commonly known as:  COREG  Take 1 tablet (12.5 mg total) by  mouth 2 (two) times daily with a meal.     docusate sodium 100 MG capsule  Commonly known as:  COLACE  Take 1 capsule (100 mg total) by mouth 2 (two) times daily.     enoxaparin 40 MG/0.4ML injection  Commonly known as:  LOVENOX  Inject 0.4 mLs (40 mg total) into the skin every 12 (twelve) hours.     ferrous sulfate 325 (65 FE) MG tablet  Take 1 tablet (325 mg total) by mouth 3 (three) times daily after meals.     furosemide 80 MG tablet  Commonly known as:  LASIX  Take 1 tablet (80 mg total) by mouth 2 (two) times daily.     HYDROcodone-acetaminophen 7.5-325 MG per tablet  Commonly known as:  NORCO  Take 1-2 tablets by mouth every 4 (four) hours as needed for moderate pain.     lisinopril 40 MG tablet  Commonly known as:  PRINIVIL,ZESTRIL  TAKE 1 TABLET BY MOUTH DAILY     metolazone 2.5 MG tablet  Commonly known as:  ZAROXOLYN  TAKE 1 TABLET BY MOUTH EVERY DAY     polyethylene glycol packet  Commonly known as:  MIRALAX / GLYCOLAX  Take 17 g by mouth 2 (two) times daily.  potassium chloride SA 20 MEQ tablet  Commonly known as:  K-DUR,KLOR-CON  Take 100 mEq by mouth once. 5 tablets, twice daily     sitaGLIPtin 100 MG tablet  Commonly known as:  JANUVIA  Take 1 tablet (100 mg total) by mouth daily.     tamsulosin 0.4 MG Caps capsule  Commonly known as:  FLOMAX  TAKE ONE CAPSULE BY MOUTH TWICE DAILY     warfarin 5 MG tablet  Commonly known as:  COUMADIN  Take 1 tablet by mouth daily or as directed by coumadin clinic        No orders of the defined types were placed in this encounter.    Immunization History  Administered Date(s) Administered  . Influenza Whole 09/08/2008, 08/30/2010, 09/06/2011  . Influenza,inj,Quad PF,36+ Mos 09/22/2014  . Influenza-Unspecified 08/27/2012  . Pneumococcal Conjugate-13 08/30/2010  . Pneumococcal Polysaccharide-23 07/10/1996  . Td 02/07/1997  . Tdap 09/22/2014    History  Substance Use Topics  . Smoking status: Never  Smoker   . Smokeless tobacco: Never Used  . Alcohol Use: No    Family history is noncontributory    Review of Systems  DATA OBTAINED: from patient, nurse GENERAL:  no fevers, fatigue, appetite changes SKIN: lots of bruising EYES: No eye pain, redness, discharge EARS: No earache, tinnitus, change in hearing NOSE: No congestion, drainage or bleeding  MOUTH/THROAT: No mouth or tooth pain, No sore throat RESPIRATORY: No cough, wheezing, SOB CARDIAC: No chest pain, palpitations, lower extremity edema  GI: No abdominal pain, No N/V/D or constipation, No heartburn or reflux  GU: No dysuria, frequency or urgency, or incontinence  MUSCULOSKELETAL: No unrelieved bone/joint pain NEUROLOGIC: No headache, dizziness or focal weakness PSYCHIATRIC: No overt anxiety or sadness, No behavior issue.   Filed Vitals:   02/22/15 1419  BP: 140/65  Pulse: 78  Temp: 97.3 F (36.3 C)  Resp: 20    Physical Exam  GENERAL APPEARANCE: Alert, conversant,  No acute distress.  SKIN: incision  Looks fine; large bruising dependent to knee without heat HEAD: Normocephalic, atraumatic  EYES: Conjunctiva/lids clear. Pupils round, reactive. EOMs intact.  EARS: External exam WNL, canals clear. Hearing grossly normal.  NOSE: No deformity or discharge.  MOUTH/THROAT: Lips w/o lesions  RESPIRATORY: Breathing is even, unlabored. Lung sounds are clear   CARDIOVASCULAR: Heart irreg no murmurs, rubs or gallops. Trace LLE peripheral edema.   GASTROINTESTINAL: Abdomen is soft, non-tender, not distended w/ normal bowel sounds. GENITOURINARY: Bladder non tender, not distended  MUSCULOSKELETAL: No abnormal joints or musculature NEUROLOGIC:  Cranial nerves 2-12 grossly intact. Moves all extremities  PSYCHIATRIC: Mood and affect appropriate to situation, no behavioral issues  Patient Active Problem List   Diagnosis Date Noted  . Systolic CHF, chronic 02/21/2015  . S/P left TKA 02/15/2015  . S/P knee replacement  02/15/2015  . Preop cardiovascular exam 02/02/2015  . Encounter for therapeutic drug monitoring 03/31/2014  . Automatic implantable cardioverter-defibrillator in situ 10/16/2012  . 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 Screening for prostate cancer 04/11/2012  . Encounter for long-term (current) use of other medications 04/11/2012  . Encounter for long-term (current) use of anticoagulants 02/25/2012  . Diabetes 10/18/2010  . MEMORY LOSS 08/30/2010  . HYPOKALEMIA 07/31/2010  . Thrombocytopenia 06/20/2009  . Hyperuricemia 06/20/2009  . OTHER PRIMARY CARDIOMYOPATHIES 04/29/2009  . Dyslipidemia 02/21/2009  . BRADYCARDIA 02/21/2009  . CHRON/UNSPEC PEPTC ULCER UNSPEC SITE W/PERF&OBST 02/21/2009  . LOC OSTEOARTHROS NOT SPEC PRIM/SEC UNSPEC SITE 02/21/2009  . EDEMA 02/21/2009  .  NEPHROPATHY, DIABETIC 03/16/2008  . Atrial fibrillation 03/16/2008  . Anemia, iron deficiency 08/09/2007  . Essential hypertension 08/09/2007  . MYOCARDIAL INFARCTION, HX OF 08/09/2007  . Congestive heart failure 08/09/2007  . Osteoarthritis 08/09/2007    CBC    Component Value Date/Time   WBC 11.6* 02/18/2015 0531   RBC 3.52* 02/18/2015 0531   HGB 10.8* 02/19/2015 1130   HCT 31.6* 02/19/2015 1130   PLT 117* 02/18/2015 0531   MCV 92.9 02/18/2015 0531   LYMPHSABS 1.4 09/22/2014 1121   MONOABS 0.6 09/22/2014 1121   EOSABS 0.3 09/22/2014 1121   BASOSABS 0.0 09/22/2014 1121    CMP     Component Value Date/Time   NA 138 02/19/2015 1130   K 2.8* 02/19/2015 1130   CL 95* 02/19/2015 1130   CO2 31 02/19/2015 1130   GLUCOSE 169* 02/19/2015 1130   BUN 45* 02/19/2015 1130   CREATININE 1.08 02/19/2015 1130   CREATININE 0.98 02/01/2014 1833   CALCIUM 8.0* 02/19/2015 1130   CALCIUM 9.8 04/11/2012 0925   PROT 7.7 09/22/2014 1121   ALBUMIN 3.6 09/22/2014 1121   AST 24 09/22/2014 1121   ALT 20 09/22/2014 1121   ALKPHOS 65 09/22/2014 1121   BILITOT 1.0 09/22/2014 1121   GFRNONAA 61* 02/19/2015 1130   GFRAA 71*  02/19/2015 1130    Assessment and Plan  S/P left TKA 2/2 OA; no complications; norco for pain and Coumadin as prophylaxis;already on 2/2 afib.;Admitted to SNF for OT/PT   Anemia, iron deficiency D/c Hb 10.8;pt did not require tx but in on iron TID post surgery   HYPOKALEMIA Pt had somw hypokalemia in hospital felt to be from Lasix;he was repleted with orals   Thrombocytopenia Looks to be chronic; PLT 102-136 last 6 draws, stabvle   Atrial fibrillation Chronic;rate controlled with coreg,prophylaxed with coumadin   Systolic CHF, chronic Pt  Is on coreg and lasix, ACE and statin;Plan-continue all,w atch K+   Diabetes A1c 7.5 on no meds, he is supposed to start Januvia but hasn't yet;on ACE and statin     Margit Hanks, MD

## 2015-02-24 ENCOUNTER — Encounter: Payer: Self-pay | Admitting: Internal Medicine

## 2015-02-24 ENCOUNTER — Non-Acute Institutional Stay (SKILLED_NURSING_FACILITY): Payer: Medicare Other | Admitting: Internal Medicine

## 2015-02-24 DIAGNOSIS — E876 Hypokalemia: Secondary | ICD-10-CM | POA: Diagnosis not present

## 2015-02-24 DIAGNOSIS — I482 Chronic atrial fibrillation, unspecified: Secondary | ICD-10-CM

## 2015-02-24 DIAGNOSIS — I5022 Chronic systolic (congestive) heart failure: Secondary | ICD-10-CM | POA: Diagnosis not present

## 2015-02-24 DIAGNOSIS — Z96652 Presence of left artificial knee joint: Secondary | ICD-10-CM

## 2015-02-24 DIAGNOSIS — I10 Essential (primary) hypertension: Secondary | ICD-10-CM | POA: Diagnosis not present

## 2015-02-24 NOTE — Progress Notes (Signed)
Patient ID: [name], male   DOB: [birth year], 81 y.o.   MRN: [id number]   This is an a discharge note  Level care skilled.  Facility [facility].  Chief complaint- Discharge note.  History of present illness.  Patient is a pleasant 81 year old male who recently underwent a left total knee arthroplasty secondary to severe end-stage osteoarthritis of the left knee.  Apparently he tolerated the procedure well.  He was on Lovenox as well as Coumadin for anticoagulation he does have a history of atrial fibrillation--Lovenox has since been discontinued since INR is therapeutic.  Patient does have a history of CHF with a reduced ejection fraction most recently 30-35 percent-he has an aggressive diuretic regimen he was discharged on Lasix 80 mg twice a day as well as Zaroxolyn 2.5 mg a day along with 100  meq of potassium twice a day,  It appears his potassium was fairly low in the hospital and this was being supplemented aggressively-potassium on discharge on March 12 was 2.8-on call provider was notified and did hold the Lasix and started Aldactone 25 mg a day in place of the Lasix-also ordered a metabolic panel which was drawn on March 15 and potassium was up to 4.8 per nursing staff  Patient appears to be stable vital signs are stable he has no acute complaints.--Denies any shortness breath or cough-states he has not been urinating as often since the Aldactone started  Patient will be going home with his family he has a very attentive daughter who is actually in the room with him today-she will need continued PT and OT for strengthening as well as nursing support for his multiple medical issues as well as monitoring his INR-  Past Medical History  Diagnosis Date  . CHF (congestive heart failure)   . HTN (hypertension)   . OA (osteoarthritis)   . BPH (benign prostatic hyperplasia)   . Obesity   . Anemia   . Diabetes mellitus type II   . Atrial fibrillation   . Cardiomyopathy     . Hyperlipidemia   . Peptic ulcer   . Bradycardia   . History of colonoscopy   . ICD (implantable cardiac defibrillator) in place 10/14/2012    biventricular  . GERD (gastroesophageal reflux disease)     Past Surgical History  Procedure Laterality Date  . L-spine  1965  . Total knee arthroplasty  1997    right  . Lumbar l4-5 & s1  02/2000  . Esophagogastroduodenoscopy  06/24/2002  . Ep implantable device Left   . Total knee arthroplasty Left 02/15/2015    Procedure: LEFT TOTAL KNEE ARTHROPLASTY;  Surgeon: Durene Romans, MD;  Location: [facility];  Service: Orthopedics;  Laterality: Left;      Medication List       .               allopurinol 100 MG tablet  Commonly known as:  ZYLOPRIM  TAKE 1 TABLET BY MOUTH EVERY DAY     atorvastatin 20 MG tablet  Commonly known as:  LIPITOR  TAKE 1 TABLET BY MOUTH EVERY DAY     carvedilol 12.5 MG tablet  Commonly known as:  COREG  Take 1 tablet (12.5 mg total) by mouth 2 (two) times daily with a meal.     docusate sodium 100 MG capsule  Commonly known as:  COLACE  Take 1 capsule (100 mg total) by mouth 2 (two) times daily.      Aldactone 25 mg by  mouth daily          ferrous sulfate 325 (65 FE) MG tablet  Take 1 tablet (325 mg total) by mouth 3 (three) times daily after meals.               HYDROcodone-acetaminophen 7.5-325 MG per tablet  Commonly known as:  NORCO  Take 1-2 tablets by mouth every 4 (four) hours as needed for moderate pain.     lisinopril 40 MG tablet  Commonly known as:  PRINIVIL,ZESTRIL  TAKE 1 TABLET BY MOUTH DAILY     metolazone 2.5 MG tablet  Commonly known as:  ZAROXOLYN  TAKE 1 TABLET BY MOUTH EVERY DAY     polyethylene glycol packet  Commonly known as:  MIRALAX / GLYCOLAX  Take 17 g by mouth 2 (two) times daily.     potassium chloride SA 20 MEQ tablet  Commonly known as:  K-DUR,KLOR-CON  Take 100 mEq by mouth equal 5 tablets by mouth daily      sitaGLIPtin 100 MG tablet  Commonly  known as:  JANUVIA  Take 1 tablet (100 mg total) by mouth daily.     tamsulosin 0.4 MG Caps capsule  Commonly known as:  FLOMAX  TAKE ONE CAPSULE BY MOUTH TWICE DAILY     warfarin 5 MG tablet  Commonly known as:  COUMADIN  Take 1 tablet by mouth daily or as directed by coumadin clinic        No orders of the defined types were placed in this encounter.    Immunization History  Administered Date(s) Administered  . Influenza Whole 09/08/2008, 08/30/2010, 09/06/2011  . Influenza,inj,Quad PF,36+ Mos 09/22/2014  . Influenza-Unspecified 08/27/2012  . Pneumococcal Conjugate-13 08/30/2010  . Pneumococcal Polysaccharide-23 07/10/1996  . Td 02/07/1997  . Tdap 09/22/2014    History  Substance Use Topics  . Smoking status: Never Smoker   . Smokeless tobacco: Never Used  . Alcohol Use: No    Family history is noncontributory    Review of Systems  DATA OBTAINED: from patient, nurse, medical record, family member GENERAL:  no fevers, fatigue, appetite changes appears stronger than when I saw him earlier this week SKIN: No itching, rash or wounds surgical site left knee is covered this is followed by orthopedics--I do not see any surrounding erythema EYES: No eye pain, redness, discharge EARS: No earache, tinnitus, change in hearing NOSE: No congestion, drainage or bleeding  MOUTH/THROAT: No mouth or tooth pain, No sore throat RESPIRATORY: No cough, wheezing, SOB CARDIAC: No chest pain, palpitations, mild lower extremity edema  GI: No abdominal pain, No N/V/D or constipation, No heartburn or reflux  GU: No dysuria, or urgency, or incontinence --urinates often he is on a diuretic MUSCULOSKELETAL: No unrelieved bone/joint pain NEUROLOGIC: No headache, dizziness or focal weakness--left knee joint pain appears well controlled at this point PSYCHIATRIC: No overt anxiety or sadness, No behavior issue.                       Physical Exam T-. 96.9 pulse 70 respirations 20  blood pressure 138/65-140/68-I do see a 90/59 last night--however I took it manually this evening and got 104/62 GENERAL APPEARANCE: Alert, conversant,  No acute distress.  SKIN: No diaphoresis rash surgical site left knee covered this is followed by orthopedics I do not see any surrounding erythema or drainage has some mild warmth postsurgical which is not unexpected HEAD: Normocephalic, atraumatic  EYES: Conjunctiva/lids clear. Pupils round, reactive. EOMs intact.  EARS: External exam WNL, canals clear. Hearing grossly normal.  NOSE: No deformity or discharge.  MOUTH/THROAT: Lips w/o lesions or apparent clear mucous membranes moist  RESPIRATORY: Breathing is even, unlabored. Lung sounds are clear   CARDIOVASCULAR: Heart RRR no murmurs, rubs or gallops. mild peripheral edema pulses intact bilaterally.   GASTROINTESTINAL: Abdomen is soft, non-tender, not distended w/ normal bowel sounds. GENITOURINARY: Bladder non tender, not distended  MUSCULOSKELETAL: No abnormal joints or musculature again the left knee surgical site has dressing followed by orthopedics he is ambulating with a walker as well as with cane NEUROLOGIC:  Cranial nerves 2-12 grossly intact. Moves all extremities  PSYCHIATRIC: Mood and affect appropriate to situation, no behavioral issues  Patient Active Problem List   Diagnosis Date Noted  . Systolic CHF, chronic 02/21/2015  . S/P left TKA 02/15/2015  . S/P knee replacement 02/15/2015  . Preop cardiovascular exam 02/02/2015  . Encounter for therapeutic drug monitoring 03/31/2014  . Automatic implantable cardioverter-defibrillator in situ 10/16/2012  . 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 Screening for prostate cancer 04/11/2012  . Encounter for long-term (current) use of other medications 04/11/2012  . Encounter for long-term (current) use of anticoagulants 02/25/2012  . Diabetes 10/18/2010  . MEMORY LOSS 08/30/2010  . HYPOKALEMIA 07/31/2010  . Thrombocytopenia 06/20/2009   . Hyperuricemia 06/20/2009  . OTHER PRIMARY CARDIOMYOPATHIES 04/29/2009  . Dyslipidemia 02/21/2009  . BRADYCARDIA 02/21/2009  . CHRON/UNSPEC PEPTC ULCER UNSPEC SITE W/PERF&OBST 02/21/2009  . LOC OSTEOARTHROS NOT SPEC PRIM/SEC UNSPEC SITE 02/21/2009  . EDEMA 02/21/2009  . NEPHROPATHY, DIABETIC 03/16/2008  . Atrial fibrillation 03/16/2008  . ANEMIA-NOS 08/09/2007  . Essential hypertension 08/09/2007  . MYOCARDIAL INFARCTION, HX OF 08/09/2007  . Congestive heart failure 08/09/2007  . Osteoarthritis 08/09/2007    CBC    Component Value Date/Time   WBC 11.6* 02/18/2015 0531   RBC 3.52* 02/18/2015 0531   HGB 10.8* 02/19/2015 1130   HCT 31.6* 02/19/2015 1130   PLT 117* 02/18/2015 0531   MCV 92.9 02/18/2015 0531   LYMPHSABS 1.4 09/22/2014 1121   MONOABS 0.6 09/22/2014 1121   EOSABS 0.3 09/22/2014 1121   BASOSABS 0.0 09/22/2014 1121    CMP     Component Value Date/Time   NA 138 02/19/2015 1130   K 2.8* 02/19/2015 1130   CL 95* 02/19/2015 1130   CO2 31 02/19/2015 1130   GLUCOSE 169* 02/19/2015 1130   BUN 45* 02/19/2015 1130   CREATININE 1.08 02/19/2015 1130   CREATININE 0.98 02/01/2014 1833   CALCIUM 8.0* 02/19/2015 1130   CALCIUM 9.8 04/11/2012 0925   PROT 7.7 09/22/2014 1121   ALBUMIN 3.6 09/22/2014 1121   AST 24 09/22/2014 1121   ALT 20 09/22/2014 1121   ALKPHOS 65 09/22/2014 1121   BILITOT 1.0 09/22/2014 1121   GFRNONAA 61* 02/19/2015 1130   GFRAA 71* 02/19/2015 1130    Assessment and Plan History of left knee replacement-appears to have tolerated the procedure well he is followed by orthopedics-continues on Coumadin for anticoagulation-he has been on this with a history of A. fib as well as-INR is pending for tomorrow Lovenox has been discontinued-he is receiving Norco for pain relief this appears to help.  He is also on iron I suspect for supplementation status post surgery.  #2 history of systolic CHF-clinically this appears stable he is now on Aldactone 25  mg a day metolazone 2.5 mg a day along with potassium 100 mEq 2 times a day originally-since he is now on Aldactone and Lasix has  been discontinued will cut down the dose of potassium to 100 mEq a day and have a lab check tomorrow this will have to be followed closely as an outpatient  there was an issue with hypokalemia with potassium of 2.8 late last week again this is normalized per most recent lab-will have to be followed closely however. Also I have written an order for home health to draw a BMP on Monday, March 21st and notify primary care provider of results  He is on a beta blocker as well as ACE inhibitor and a statin.  #3 history hypertension he is on Coreg and lisinopril I do note 1 low reading last night per his daughter been running somewhat lower during his stay here--I got again 104/62 manually this evening-this was discussed with his daughter at this point would be hesitant to change medications before discharge he is asymptomatic of any hypotension no dizziness syncopal-type feelings or palpitations --his daughter is more comfortable with leaving medications as they are-especially since he is about to be discharged-she will monitor his blood pressures at home  #4-history of atrial fibrillation this appears rate controlled on Coreg he is on anticoagulation with Coumadin-INR is pending for tomorrow this will be followed by home health as an outpatient as well  #5 history diabetes type 2 he is on Januvia-blood sugars appear to be stable.  #6-history BPH he is on Flomax    #7history of gout he is on allopurinol this has not been an issue during his stay   Patient will be going home tomorrow he does have a very supportive family-a very involved daughter-he will need continued PT and OT for strengthening status post knee replacement-also nursing follow-up multiple medical issues including A. fib with chronic anticoagulation as well as CHF and electrolyte abnormalities.  He will need a  metabolic panel drawn tomorrow-also PT/INR will be drawn tomorrow-will need expedient follow-up of these as well as an outpatient home health to draw the INR and follow BMP.  [id number] note greater than 30 minutes spent on this discharge summary.--Scripts have been written

## 2015-02-25 ENCOUNTER — Other Ambulatory Visit: Payer: Self-pay | Admitting: Endocrinology

## 2015-02-27 ENCOUNTER — Encounter: Payer: Self-pay | Admitting: Internal Medicine

## 2015-02-27 NOTE — Assessment & Plan Note (Signed)
Pt had somw hypokalemia in hospital felt to be from Lasix;he was repleted with orals

## 2015-02-27 NOTE — Assessment & Plan Note (Signed)
Pt  Is on coreg and lasix, ACE and statin;Plan-continue all,w atch K+

## 2015-02-27 NOTE — Assessment & Plan Note (Signed)
A1c 7.5 on no meds, he is supposed to start Januvia but hasn't yet;on ACE and statin

## 2015-02-27 NOTE — Assessment & Plan Note (Signed)
Chronic;rate controlled with coreg,prophylaxed with coumadin

## 2015-02-27 NOTE — Assessment & Plan Note (Signed)
Looks to be chronic; PLT 102-136 last 6 draws, stabvle

## 2015-02-27 NOTE — Assessment & Plan Note (Signed)
D/c Hb 10.8;pt did not require tx but in on iron TID post surgery

## 2015-02-27 NOTE — Assessment & Plan Note (Signed)
2/2 OA; no complications; norco for pain and Coumadin as prophylaxis;already on 2/2 afib.;Admitted to SNF for OT/PT

## 2015-02-28 DIAGNOSIS — M1712 Unilateral primary osteoarthritis, left knee: Secondary | ICD-10-CM | POA: Diagnosis not present

## 2015-03-02 ENCOUNTER — Ambulatory Visit: Payer: Medicare Other | Admitting: Endocrinology

## 2015-03-02 DIAGNOSIS — Z96652 Presence of left artificial knee joint: Secondary | ICD-10-CM | POA: Diagnosis not present

## 2015-03-02 DIAGNOSIS — Z471 Aftercare following joint replacement surgery: Secondary | ICD-10-CM | POA: Diagnosis not present

## 2015-03-09 DIAGNOSIS — M1712 Unilateral primary osteoarthritis, left knee: Secondary | ICD-10-CM | POA: Diagnosis not present

## 2015-03-10 ENCOUNTER — Encounter: Payer: Self-pay | Admitting: *Deleted

## 2015-03-13 ENCOUNTER — Other Ambulatory Visit: Payer: Self-pay | Admitting: Cardiology

## 2015-03-14 DIAGNOSIS — M1712 Unilateral primary osteoarthritis, left knee: Secondary | ICD-10-CM | POA: Diagnosis not present

## 2015-03-16 ENCOUNTER — Telehealth: Payer: Self-pay | Admitting: Cardiology

## 2015-03-16 DIAGNOSIS — M1712 Unilateral primary osteoarthritis, left knee: Secondary | ICD-10-CM | POA: Diagnosis not present

## 2015-03-16 NOTE — Telephone Encounter (Signed)
New message  Pt daughter called in, requests a call back to determine how and when a remote device check should be sent. Please call

## 2015-03-16 NOTE — Telephone Encounter (Signed)
Pt daughter agreed to remote check for Thursday 03-17-15

## 2015-03-17 ENCOUNTER — Encounter: Payer: Self-pay | Admitting: Internal Medicine

## 2015-03-17 ENCOUNTER — Ambulatory Visit: Payer: Medicare Other | Admitting: *Deleted

## 2015-03-17 ENCOUNTER — Ambulatory Visit (INDEPENDENT_AMBULATORY_CARE_PROVIDER_SITE_OTHER): Payer: Medicare Other | Admitting: *Deleted

## 2015-03-17 DIAGNOSIS — I429 Cardiomyopathy, unspecified: Secondary | ICD-10-CM

## 2015-03-17 LAB — MDC_IDC_ENUM_SESS_TYPE_REMOTE
Brady Statistic AS VP Percent: 91.18 %
Brady Statistic RA Percent Paced: 0 %
Brady Statistic RV Percent Paced: 91.18 %
Date Time Interrogation Session: 20160407203301
HighPow Impedance: 304 Ohm
HighPow Impedance: 69 Ohm
Lead Channel Impedance Value: 342 Ohm
Lead Channel Impedance Value: 399 Ohm
Lead Channel Impedance Value: 627 Ohm
Lead Channel Pacing Threshold Pulse Width: 0.4 ms
Lead Channel Setting Pacing Amplitude: 3 V
Lead Channel Setting Pacing Pulse Width: 0.4 ms
Lead Channel Setting Pacing Pulse Width: 1.5 ms
Lead Channel Setting Sensing Sensitivity: 0.3 mV
MDC IDC MSMT BATTERY VOLTAGE: 2.94 V
MDC IDC MSMT LEADCHNL LV PACING THRESHOLD AMPLITUDE: 1.375 V
MDC IDC MSMT LEADCHNL LV PACING THRESHOLD PULSEWIDTH: 1.5 ms
MDC IDC MSMT LEADCHNL RA IMPEDANCE VALUE: 437 Ohm
MDC IDC MSMT LEADCHNL RA SENSING INTR AMPL: 0.25 mV
MDC IDC MSMT LEADCHNL RV IMPEDANCE VALUE: 342 Ohm
MDC IDC MSMT LEADCHNL RV PACING THRESHOLD AMPLITUDE: 0.5 V
MDC IDC MSMT LEADCHNL RV SENSING INTR AMPL: 4.375 mV
MDC IDC SET LEADCHNL RV PACING AMPLITUDE: 2 V
MDC IDC SET ZONE DETECTION INTERVAL: 350 ms
MDC IDC SET ZONE DETECTION INTERVAL: 400 ms
MDC IDC STAT BRADY AP VP PERCENT: 0 %
MDC IDC STAT BRADY AP VS PERCENT: 0 %
MDC IDC STAT BRADY AS VS PERCENT: 8.82 %
Zone Setting Detection Interval: 270 ms
Zone Setting Detection Interval: 310 ms

## 2015-03-21 DIAGNOSIS — M1712 Unilateral primary osteoarthritis, left knee: Secondary | ICD-10-CM | POA: Diagnosis not present

## 2015-03-22 NOTE — Progress Notes (Signed)
Remote ICD transmission.   

## 2015-03-23 DIAGNOSIS — M1712 Unilateral primary osteoarthritis, left knee: Secondary | ICD-10-CM | POA: Diagnosis not present

## 2015-03-25 ENCOUNTER — Ambulatory Visit: Payer: Medicare Other | Admitting: Endocrinology

## 2015-03-25 DIAGNOSIS — M1712 Unilateral primary osteoarthritis, left knee: Secondary | ICD-10-CM | POA: Diagnosis not present

## 2015-03-28 DIAGNOSIS — M1712 Unilateral primary osteoarthritis, left knee: Secondary | ICD-10-CM | POA: Diagnosis not present

## 2015-03-31 DIAGNOSIS — Z96652 Presence of left artificial knee joint: Secondary | ICD-10-CM | POA: Diagnosis not present

## 2015-03-31 DIAGNOSIS — Z471 Aftercare following joint replacement surgery: Secondary | ICD-10-CM | POA: Diagnosis not present

## 2015-04-04 ENCOUNTER — Encounter: Payer: Self-pay | Admitting: Cardiology

## 2015-04-06 ENCOUNTER — Encounter: Payer: Self-pay | Admitting: *Deleted

## 2015-04-18 ENCOUNTER — Other Ambulatory Visit: Payer: Self-pay | Admitting: Internal Medicine

## 2015-04-19 ENCOUNTER — Telehealth: Payer: Self-pay | Admitting: Endocrinology

## 2015-04-19 ENCOUNTER — Encounter: Payer: Self-pay | Admitting: Cardiology

## 2015-04-19 NOTE — Telephone Encounter (Signed)
Pt needs refills on all meds except flomax and potassium  Pt has questions about med he was put on in rehab for his potassium it is called spironolactone 25 mg.

## 2015-04-19 NOTE — Telephone Encounter (Signed)
Error

## 2015-04-20 ENCOUNTER — Other Ambulatory Visit: Payer: Self-pay

## 2015-04-20 MED ORDER — LISINOPRIL 40 MG PO TABS: 40.0000 mg | ORAL_TABLET | Freq: Every day | ORAL | Status: DC

## 2015-04-20 MED ORDER — SPIRONOLACTONE 25 MG PO TABS: 25.0000 mg | ORAL_TABLET | Freq: Every day | ORAL | Status: DC

## 2015-04-20 MED ORDER — ATORVASTATIN CALCIUM 20 MG PO TABS: 20.0000 mg | ORAL_TABLET | Freq: Every day | ORAL | Status: DC

## 2015-04-20 MED ORDER — METOLAZONE 2.5 MG PO TABS: 2.5000 mg | ORAL_TABLET | Freq: Every day | ORAL | Status: DC

## 2015-04-20 MED ORDER — ALLOPURINOL 100 MG PO TABS
100.0000 mg | ORAL_TABLET | Freq: Every day | ORAL | Status: DC
Start: 2015-04-20 — End: 2015-08-17

## 2015-04-20 NOTE — Telephone Encounter (Signed)
Rx sent. Pt scheduled for 04/29/2015

## 2015-04-20 NOTE — Telephone Encounter (Signed)
Pt's daughter called requesting a refill on the patients spirolactone. Pt was started on this medication in the hospital and they would like to continue with this medication. Please advise, Thanks!

## 2015-04-20 NOTE — Telephone Encounter (Signed)
i am not the prescriber for the coumadin.  The others are otc

## 2015-04-20 NOTE — Addendum Note (Signed)
Addended by: Bethann Punches E on: 04/20/2015 04:26 PM   Modules accepted: Orders

## 2015-04-20 NOTE — Telephone Encounter (Signed)
Please refill x 1 Ov is due  

## 2015-04-20 NOTE — Telephone Encounter (Signed)
Patient called requesting refills on his Colace, Iron, Mirlax and Coumadin. These medication are listed under a different provider. Please advise if ok to refill rx's.

## 2015-04-22 ENCOUNTER — Telehealth: Payer: Self-pay | Admitting: Endocrinology

## 2015-04-22 NOTE — Telephone Encounter (Signed)
Pt  Daughter called about prescription wants to know if Dr. Everardo All will signoff onit and fill it it was given n rehab and has been working very well ( name of Drug is spironolactone 25mg  )  Please call daughter back at [phone]

## 2015-04-22 NOTE — Telephone Encounter (Signed)
Patients daughter advised that rx has been sent.

## 2015-04-29 ENCOUNTER — Ambulatory Visit (INDEPENDENT_AMBULATORY_CARE_PROVIDER_SITE_OTHER): Payer: Medicare Other | Admitting: Endocrinology

## 2015-04-29 ENCOUNTER — Encounter: Payer: Self-pay | Admitting: Endocrinology

## 2015-04-29 VITALS — BP 136/60 | HR 92 | Temp 97.6°F | Ht 68.5 in | Wt 245.0 lb

## 2015-04-29 DIAGNOSIS — E1151 Type 2 diabetes mellitus with diabetic peripheral angiopathy without gangrene: Secondary | ICD-10-CM

## 2015-04-29 DIAGNOSIS — E876 Hypokalemia: Secondary | ICD-10-CM

## 2015-04-29 DIAGNOSIS — D509 Iron deficiency anemia, unspecified: Secondary | ICD-10-CM

## 2015-04-29 LAB — CBC WITH DIFFERENTIAL/PLATELET
BASOS ABS: 0 10*3/uL (ref 0.0–0.1)
Basophils Relative: 0.4 % (ref 0.0–3.0)
EOS ABS: 0.2 10*3/uL (ref 0.0–0.7)
Eosinophils Relative: 3.7 % (ref 0.0–5.0)
HEMATOCRIT: 37.6 % — AB (ref 39.0–52.0)
Hemoglobin: 12.6 g/dL — ABNORMAL LOW (ref 13.0–17.0)
LYMPHS ABS: 1.8 10*3/uL (ref 0.7–4.0)
Lymphocytes Relative: 27.9 % (ref 12.0–46.0)
MCHC: 33.6 g/dL (ref 30.0–36.0)
MCV: 93 fl (ref 78.0–100.0)
MONO ABS: 0.6 10*3/uL (ref 0.1–1.0)
Monocytes Relative: 10 % (ref 3.0–12.0)
NEUTROS PCT: 58 % (ref 43.0–77.0)
Neutro Abs: 3.7 10*3/uL (ref 1.4–7.7)
Platelets: 192 10*3/uL (ref 150.0–400.0)
RBC: 4.05 Mil/uL — ABNORMAL LOW (ref 4.22–5.81)
RDW: 15.9 % — AB (ref 11.5–15.5)
WBC: 6.3 10*3/uL (ref 4.0–10.5)

## 2015-04-29 LAB — BASIC METABOLIC PANEL
BUN: 22 mg/dL (ref 6–23)
CO2: 27 mEq/L (ref 19–32)
Calcium: 9.6 mg/dL (ref 8.4–10.5)
Chloride: 99 mEq/L (ref 96–112)
Creatinine, Ser: 0.83 mg/dL (ref 0.40–1.50)
GFR: 93.77 mL/min (ref >60.00)
Glucose, Bld: 126 mg/dL — ABNORMAL HIGH (ref 70–99)
Potassium: 4 mEq/L (ref 3.5–5.1)
Sodium: 135 mEq/L (ref 135–145)

## 2015-04-29 LAB — IBC PANEL
IRON: 100 ug/dL (ref 42–165)
Saturation Ratios: 29.4 % (ref 20.0–50.0)
Transferrin: 243 mg/dL (ref 212.0–360.0)

## 2015-04-29 LAB — HEMOGLOBIN A1C: HEMOGLOBIN A1C: 6.3 % (ref 4.6–6.5)

## 2015-04-29 LAB — VITAMIN D 25 HYDROXY (VIT D DEFICIENCY, FRACTURES): VITD: 13.81 ng/mL — AB (ref 30.00–100.00)

## 2015-04-29 NOTE — Progress Notes (Signed)
Subjective:    Patient ID: [name], male    DOB: 1929-03-18, 81 y.o.   MRN: [id number]  HPI The state of at least three ongoing medical problems is addressed today, with interval history of each noted here: Hypokalemia: pt says Klor was reduced to 5x20 mEq, QD.  He also takes aldactone.  He says edema is less now   He says he never misses the medication.   CHF: edema persists. Pt returns for f/u of diabetes mellitus: DM type: 2 Dx'ed: 2008 Complications: nephropathy Therapy: none DKA: never Severe hypoglycemia: never Pancreatitis: never Interval history: he did not take [place], due to cost.  He does not check cbg's.  Hypocalcemia: denies leg cramps.   Anemia: he does not take fe tabs.  Denies brbpr Past Medical History  Diagnosis Date  . CHF (congestive heart failure)   . HTN (hypertension)   . OA (osteoarthritis)   . BPH (benign prostatic hyperplasia)   . Obesity   . Anemia   . Diabetes mellitus type II   . Atrial fibrillation   . Cardiomyopathy   . Hyperlipidemia   . Peptic ulcer   . Bradycardia   . History of colonoscopy   . ICD (implantable cardiac defibrillator) in place 10/14/2012    biventricular  . GERD (gastroesophageal reflux disease)     Past Surgical History  Procedure Laterality Date  . L-spine  1965  . Total knee arthroplasty  1997    right  . Lumbar l4-5 & s1  02/2000  . Esophagogastroduodenoscopy  06/24/2002  . Ep implantable device Left   . Total knee arthroplasty Left 02/15/2015    Procedure: LEFT TOTAL KNEE ARTHROPLASTY;  Surgeon: Durene Romans, MD;  Location: [facility];  Service: Orthopedics;  Laterality: Left;    History   Social History  . Marital Status: Widowed    Spouse Name: N/A  . Number of Children: N/A  . Years of Education: N/A   Occupational History  . Retired    Social History Main Topics  . Smoking status: Never Smoker   . Smokeless tobacco: Never Used  . Alcohol Use: No  . Drug Use: No  . Sexual Activity: Not  on file   Other Topics Concern  . Not on file   Social History Narrative    Current Outpatient Prescriptions on File Prior to Visit  Medication Sig Dispense Refill  . allopurinol (ZYLOPRIM) 100 MG tablet Take 1 tablet (100 mg total) by mouth daily. 30 tablet 2  . atorvastatin (LIPITOR) 20 MG tablet Take 1 tablet (20 mg total) by mouth daily. 30 tablet 2  . carvedilol (COREG) 12.5 MG tablet Take 1 tablet (12.5 mg total) by mouth 2 (two) times daily with a meal. 60 tablet 6  . docusate sodium (COLACE) 100 MG capsule Take 1 capsule (100 mg total) by mouth 2 (two) times daily. 10 capsule 0  . HYDROcodone-acetaminophen (NORCO) 7.5-325 MG per tablet Take 1-2 tablets by mouth every 4 (four) hours as needed for moderate pain. 100 tablet 0  . lisinopril (PRINIVIL,ZESTRIL) 40 MG tablet Take 1 tablet (40 mg total) by mouth daily. 30 tablet 2  . metolazone (ZAROXOLYN) 2.5 MG tablet Take 1 tablet (2.5 mg total) by mouth daily. 30 tablet 2  . polyethylene glycol (MIRALAX / GLYCOLAX) packet Take 17 g by mouth 2 (two) times daily. 14 each 0  . potassium chloride SA (K-DUR,KLOR-CON) 20 MEQ tablet Take 100 mEq by mouth daily.     [name]  spironolactone (ALDACTONE) 25 MG tablet Take 1 tablet (25 mg total) by mouth daily. 30 tablet 0  . tamsulosin (FLOMAX) 0.4 MG CAPS capsule TAKE ONE CAPSULE BY MOUTH TWICE DAILY 60 capsule 0  . warfarin (COUMADIN) 5 MG tablet Take 1 tablet by mouth daily or as directed by coumadin clinic 90 tablet 0  . ferrous sulfate 325 (65 FE) MG tablet Take 1 tablet (325 mg total) by mouth 3 (three) times daily after meals. (Patient not taking: Reported on 04/29/2015)  3   No current facility-administered medications on file prior to visit.    No Known Allergies  Family History  Problem Relation Age of Onset  . Cancer Neg Hx   . Heart disease Father   . Heart disease Mother   . Diabetes Mother   . Hypertension Brother   . Hypertension Brother     BP 136/60 mmHg  Pulse 92   Temp(Src) 97.6 F (36.4 C) (Oral)  Ht 5' 8.5" (1.74 m)  Wt 245 lb (111.131 kg)  BMI 36.71 kg/m2  SpO2 97%    Review of Systems He has lost a few lbs.  Denies sob.    Objective:   Physical Exam VITAL SIGNS:  See vs page GENERAL: no distress Pulses: dorsalis pedis intact bilat.  Feet: no deformity. no ulcer on the feet. feet are of normal temp, but are cyanotic. 1+ bilat leg edema. bilat varicosities. bilat rust discoloration of the legs. Heavy callus on the plantar aspect of the right foot, under the MTP joint. There is bilateral onychomycosis.   Neuro: sensation is intact to touch on the feet.  Vit-D=low Lab Results  Component Value Date   CREATININE 0.83 04/29/2015   BUN 22 04/29/2015   NA 135 04/29/2015   K 4.0 04/29/2015   CL 99 04/29/2015   CO2 27 04/29/2015   Lab Results  Component Value Date   HGBA1C 6.3 04/29/2015   Lab Results  Component Value Date   WBC 6.3 04/29/2015   HGB 12.6* 04/29/2015   HCT 37.6* 04/29/2015   MCV 93.0 04/29/2015   PLT 192.0 04/29/2015       Assessment & Plan:  DM: no medication is needed now Hypokalemia: well-controlled: Please continue the same medications Vit-D deficiency: new: i have sent a prescription to your pharmacy, for this. Anemia: improved: we'll recheck in the future  Patient is advised the following: Patient Instructions  blood tests are requested for you today.  We'll contact you with results. If the blood sugar is high, i'll prescribe for you a pill called "repaglinide." Please come back for a follow-up appointment in 2 months.

## 2015-04-29 NOTE — Patient Instructions (Addendum)
blood tests are requested for you today.  We'll contact you with results. If the blood sugar is high, i'll prescribe for you a pill called "repaglinide." Please come back for a follow-up appointment in 2 months.

## 2015-04-30 MED ORDER — VITAMIN D (ERGOCALCIFEROL) 1.25 MG (50000 UNIT) PO CAPS: 50000.0000 [IU] | ORAL_CAPSULE | ORAL | Status: DC

## 2015-05-02 LAB — PTH, INTACT AND CALCIUM
Calcium: 9.3 mg/dL (ref 8.4–10.5)
PTH: 113 pg/mL — ABNORMAL HIGH (ref 14–64)

## 2015-05-04 ENCOUNTER — Ambulatory Visit (INDEPENDENT_AMBULATORY_CARE_PROVIDER_SITE_OTHER): Payer: Medicare Other | Admitting: *Deleted

## 2015-05-04 DIAGNOSIS — Z5181 Encounter for therapeutic drug level monitoring: Secondary | ICD-10-CM

## 2015-05-04 DIAGNOSIS — I4891 Unspecified atrial fibrillation: Secondary | ICD-10-CM

## 2015-05-04 DIAGNOSIS — Z7901 Long term (current) use of anticoagulants: Secondary | ICD-10-CM | POA: Diagnosis not present

## 2015-05-04 DIAGNOSIS — I482 Chronic atrial fibrillation, unspecified: Secondary | ICD-10-CM

## 2015-05-04 LAB — POCT INR: INR: 2.7

## 2015-05-13 DIAGNOSIS — Z96652 Presence of left artificial knee joint: Secondary | ICD-10-CM | POA: Diagnosis not present

## 2015-05-13 DIAGNOSIS — Z471 Aftercare following joint replacement surgery: Secondary | ICD-10-CM | POA: Diagnosis not present

## 2015-05-15 ENCOUNTER — Other Ambulatory Visit: Payer: Self-pay | Admitting: Internal Medicine

## 2015-05-15 ENCOUNTER — Other Ambulatory Visit: Payer: Self-pay | Admitting: Endocrinology

## 2015-05-19 ENCOUNTER — Other Ambulatory Visit: Payer: Self-pay | Admitting: Endocrinology

## 2015-05-21 ENCOUNTER — Other Ambulatory Visit: Payer: Self-pay | Admitting: Endocrinology

## 2015-05-26 ENCOUNTER — Encounter: Payer: Self-pay | Admitting: *Deleted

## 2015-05-30 ENCOUNTER — Encounter: Payer: Self-pay | Admitting: Internal Medicine

## 2015-05-30 ENCOUNTER — Ambulatory Visit (INDEPENDENT_AMBULATORY_CARE_PROVIDER_SITE_OTHER): Payer: Medicare Other | Admitting: Internal Medicine

## 2015-05-30 VITALS — BP 118/68 | HR 74 | Ht 68.5 in | Wt 248.8 lb

## 2015-05-30 DIAGNOSIS — I5022 Chronic systolic (congestive) heart failure: Secondary | ICD-10-CM | POA: Diagnosis not present

## 2015-05-30 DIAGNOSIS — I482 Chronic atrial fibrillation, unspecified: Secondary | ICD-10-CM

## 2015-05-30 DIAGNOSIS — Z4502 Encounter for adjustment and management of automatic implantable cardiac defibrillator: Secondary | ICD-10-CM | POA: Diagnosis not present

## 2015-05-30 DIAGNOSIS — Z95810 Presence of automatic (implantable) cardiac defibrillator: Secondary | ICD-10-CM | POA: Diagnosis not present

## 2015-05-30 LAB — CUP PACEART INCLINIC DEVICE CHECK
Battery Voltage: 2.89 V
Brady Statistic AP VP Percent: 0 %
Brady Statistic AS VS Percent: 6.16 %
Brady Statistic RV Percent Paced: 93.84 %
Date Time Interrogation Session: 20160620173444
HIGH POWER IMPEDANCE MEASURED VALUE: 63 Ohm
HighPow Impedance: 171 Ohm
HighPow Impedance: 266 Ohm
Lead Channel Impedance Value: 342 Ohm
Lead Channel Impedance Value: 380 Ohm
Lead Channel Impedance Value: 437 Ohm
Lead Channel Impedance Value: 437 Ohm
Lead Channel Pacing Threshold Amplitude: 0.625 V
Lead Channel Pacing Threshold Pulse Width: 0.4 ms
Lead Channel Pacing Threshold Pulse Width: 1.5 ms
Lead Channel Sensing Intrinsic Amplitude: 0.25 mV
Lead Channel Sensing Intrinsic Amplitude: 0.5 mV
Lead Channel Setting Pacing Amplitude: 2.5 V
Lead Channel Setting Pacing Amplitude: 2.5 V
MDC IDC MSMT LEADCHNL LV IMPEDANCE VALUE: 627 Ohm
MDC IDC MSMT LEADCHNL LV PACING THRESHOLD AMPLITUDE: 1.375 V
MDC IDC MSMT LEADCHNL RV SENSING INTR AMPL: 4.5 mV
MDC IDC MSMT LEADCHNL RV SENSING INTR AMPL: 5.125 mV
MDC IDC SET LEADCHNL LV PACING PULSEWIDTH: 1.5 ms
MDC IDC SET LEADCHNL RV PACING PULSEWIDTH: 0.4 ms
MDC IDC SET LEADCHNL RV SENSING SENSITIVITY: 0.3 mV
MDC IDC SET ZONE DETECTION INTERVAL: 310 ms
MDC IDC SET ZONE DETECTION INTERVAL: 400 ms
MDC IDC STAT BRADY AP VS PERCENT: 0 %
MDC IDC STAT BRADY AS VP PERCENT: 93.84 %
MDC IDC STAT BRADY RA PERCENT PACED: 0 %
Zone Setting Detection Interval: 270 ms
Zone Setting Detection Interval: 350 ms

## 2015-05-30 NOTE — Assessment & Plan Note (Signed)
His medtronic DDD ICD is working normally. Will recheck in several months. 

## 2015-05-30 NOTE — Assessment & Plan Note (Signed)
He is maintaining NSR 95% of the time. He will continue his current meds. Will follow.

## 2015-05-30 NOTE — Assessment & Plan Note (Signed)
His symptoms remain class 2. He will continue his current meds and level of activity.

## 2015-05-30 NOTE — Progress Notes (Signed)
HPI [name] returns today for followup. He is an 81 year old man with a nonischemic cardiomyopathy, chronic systolic heart failure, paroxysmal atrial fibrillation, status post biventricular ICD implantation. His heart failure symptoms have improved from class III to class II. His peripheral edema is much better. He denies syncope or ICD shock. No fever or chills. He has occasional fullness in his chest. He denies chest pain.  Since his last visit, he has undergone knee replacement surgery with a good result. He denies chest pain or sob. No residual pain from his knee surgery. He is walking much better.  No Known Allergies   Current Outpatient Prescriptions  Medication Sig Dispense Refill  . allopurinol (ZYLOPRIM) 100 MG tablet Take 1 tablet (100 mg total) by mouth daily. 30 tablet 2  . atorvastatin (LIPITOR) 20 MG tablet Take 1 tablet (20 mg total) by mouth daily. 30 tablet 2  . carvedilol (COREG) 12.5 MG tablet Take 1 tablet (12.5 mg total) by mouth 2 (two) times daily with a meal. 60 tablet 6  . HYDROcodone-acetaminophen (NORCO) 7.5-325 MG per tablet Take 1-2 tablets by mouth every 4 (four) hours as needed for moderate pain. 100 tablet 0  . lisinopril (PRINIVIL,ZESTRIL) 40 MG tablet Take 1 tablet (40 mg total) by mouth daily. 30 tablet 2  . metolazone (ZAROXOLYN) 2.5 MG tablet Take 1 tablet (2.5 mg total) by mouth daily. 30 tablet 2  . polyethylene glycol (MIRALAX / GLYCOLAX) packet Take 17 g by mouth 2 (two) times daily. 14 each 0  . potassium chloride SA (K-DUR,KLOR-CON) 20 MEQ tablet Take 100 mEq by mouth daily.     . potassium chloride SA (K-DUR,KLOR-CON) 20 MEQ tablet TAKE 5 TABLETS BY MOUTH TWICE DAILY 600 tablet 0  . potassium chloride SA (K-DUR,KLOR-CON) 20 MEQ tablet Take 20 mEq by mouth daily. Pt takes 10 pills daily    . spironolactone (ALDACTONE) 25 MG tablet TAKE 1 TABLET BY MOUTH EVERY DAY 30 tablet 0  . tamsulosin (FLOMAX) 0.4 MG CAPS capsule TAKE ONE CAPSULE BY MOUTH TWICE  DAILY 60 capsule 0  . warfarin (COUMADIN) 5 MG tablet Take 1 tablet by mouth daily or as directed by coumadin clinic 90 tablet 0   No current facility-administered medications for this visit.     Past Medical History  Diagnosis Date  . CHF (congestive heart failure)   . HTN (hypertension)   . OA (osteoarthritis)   . BPH (benign prostatic hyperplasia)   . Obesity   . Anemia   . Diabetes mellitus type II   . Atrial fibrillation   . Cardiomyopathy   . Hyperlipidemia   . Peptic ulcer   . Bradycardia   . History of colonoscopy   . ICD (implantable cardiac defibrillator) in place 10/14/2012    biventricular  . GERD (gastroesophageal reflux disease)     ROS:   All systems reviewed and negative except as noted in the HPI.   Past Surgical History  Procedure Laterality Date  . L-spine  1965  . Total knee arthroplasty  1997    right  . Lumbar l4-5 & s1  02/2000  . Esophagogastroduodenoscopy  06/24/2002  . Ep implantable device Left   . Total knee arthroplasty Left 02/15/2015    Procedure: LEFT TOTAL KNEE ARTHROPLASTY;  Surgeon: Durene Romans, MD;  Location: [facility];  Service: Orthopedics;  Laterality: Left;     Family History  Problem Relation Age of Onset  . Cancer Neg Hx   . Heart disease Father   .  Heart disease Mother   . Diabetes Mother   . Hypertension Brother   . Hypertension Brother      History   Social History  . Marital Status: Widowed    Spouse Name: N/A  . Number of Children: N/A  . Years of Education: N/A   Occupational History  . Retired    Social History Main Topics  . Smoking status: Never Smoker   . Smokeless tobacco: Never Used  . Alcohol Use: No  . Drug Use: No  . Sexual Activity: Not on file   Other Topics Concern  . Not on file   Social History Narrative     BP 118/68 mmHg  Pulse 74  Ht 5' 8.5" (1.74 m)  Wt 248 lb 12.8 oz (112.855 kg)  BMI 37.28 kg/m2  SpO2 98%  Physical Exam:  Well appearing 81 year old man,NAD HEENT:  Unremarkable Neck:  7 cm JVD, no thyromegally Lungs:  Clear with no wheezes, rales, or rhonchi. Well-healed ICD incision. HEART:  Regular rate rhythm, no murmurs, no rubs, no clicks Abd:  soft, positive bowel sounds, no organomegally, no rebound, no guarding Ext:  2 plus pulses, no edema, no cyanosis, no clubbing Skin:  No rashes no nodules Neuro:  CN II through XII intact, motor grossly intact  DEVICE  Normal device function.  See PaceArt for details.   Assess/Plan:

## 2015-05-30 NOTE — Patient Instructions (Signed)
Medication Instructions:  Your physician recommends that you continue on your current medications as directed. Please refer to the Current Medication list given to you today.  Labwork: None ordered  Testing/Procedures: None ordered  Follow-Up: Remote monitoring is used to monitor your Pacemaker of ICD from home. This monitoring reduces the number of office visits required to check your device to one time per year. It allows us to keep an eye on the functioning of your device to ensure it is working properly. You are scheduled for a device check from home on 08/29/15. You may send your transmission at any time that day. If you have a wireless device, the transmission will be sent automatically. After your physician reviews your transmission, you will receive a postcard with your next transmission date.  Your physician wants you to follow-up in: 1 year with Dr. Taylor.  You will receive a reminder letter in the mail two months in advance. If you don't receive a letter, please call our office to schedule the follow-up appointment.  Thank you for choosing [facility]!!          

## 2015-06-01 ENCOUNTER — Ambulatory Visit (INDEPENDENT_AMBULATORY_CARE_PROVIDER_SITE_OTHER): Payer: Medicare Other | Admitting: *Deleted

## 2015-06-01 DIAGNOSIS — I482 Chronic atrial fibrillation, unspecified: Secondary | ICD-10-CM

## 2015-06-01 DIAGNOSIS — Z5181 Encounter for therapeutic drug level monitoring: Secondary | ICD-10-CM

## 2015-06-01 DIAGNOSIS — I4891 Unspecified atrial fibrillation: Secondary | ICD-10-CM

## 2015-06-01 DIAGNOSIS — Z7901 Long term (current) use of anticoagulants: Secondary | ICD-10-CM

## 2015-06-01 LAB — POCT INR: INR: 2.8

## 2015-06-15 ENCOUNTER — Other Ambulatory Visit: Payer: Self-pay | Admitting: Cardiology

## 2015-06-20 ENCOUNTER — Other Ambulatory Visit: Payer: Self-pay | Admitting: Endocrinology

## 2015-06-20 ENCOUNTER — Other Ambulatory Visit: Payer: Self-pay

## 2015-06-20 MED ORDER — SPIRONOLACTONE 25 MG PO TABS: 25.0000 mg | ORAL_TABLET | Freq: Every day | ORAL | Status: DC

## 2015-06-29 ENCOUNTER — Ambulatory Visit (INDEPENDENT_AMBULATORY_CARE_PROVIDER_SITE_OTHER): Payer: Medicare Other | Admitting: Endocrinology

## 2015-06-29 ENCOUNTER — Ambulatory Visit (INDEPENDENT_AMBULATORY_CARE_PROVIDER_SITE_OTHER): Payer: Medicare Other | Admitting: *Deleted

## 2015-06-29 ENCOUNTER — Encounter: Payer: Self-pay | Admitting: Endocrinology

## 2015-06-29 VITALS — BP 138/90 | HR 75 | Temp 97.5°F | Resp 16 | Ht 68.75 in | Wt 247.0 lb

## 2015-06-29 DIAGNOSIS — E559 Vitamin D deficiency, unspecified: Secondary | ICD-10-CM | POA: Insufficient documentation

## 2015-06-29 DIAGNOSIS — D509 Iron deficiency anemia, unspecified: Secondary | ICD-10-CM

## 2015-06-29 DIAGNOSIS — E876 Hypokalemia: Secondary | ICD-10-CM | POA: Diagnosis not present

## 2015-06-29 DIAGNOSIS — I4891 Unspecified atrial fibrillation: Secondary | ICD-10-CM | POA: Diagnosis not present

## 2015-06-29 DIAGNOSIS — I482 Chronic atrial fibrillation, unspecified: Secondary | ICD-10-CM

## 2015-06-29 DIAGNOSIS — Z7901 Long term (current) use of anticoagulants: Secondary | ICD-10-CM

## 2015-06-29 DIAGNOSIS — Z5181 Encounter for therapeutic drug level monitoring: Secondary | ICD-10-CM | POA: Diagnosis not present

## 2015-06-29 DIAGNOSIS — E1151 Type 2 diabetes mellitus with diabetic peripheral angiopathy without gangrene: Secondary | ICD-10-CM | POA: Diagnosis not present

## 2015-06-29 LAB — BASIC METABOLIC PANEL
BUN: 24 mg/dL — AB (ref 6–23)
CALCIUM: 9.7 mg/dL (ref 8.4–10.5)
CO2: 28 meq/L (ref 19–32)
Chloride: 103 mEq/L (ref 96–112)
Creatinine, Ser: 0.93 mg/dL (ref 0.40–1.50)
GFR: 82.2 mL/min (ref >60.00)
Glucose, Bld: 100 mg/dL — ABNORMAL HIGH (ref 70–99)
Potassium: 4.5 mEq/L (ref 3.5–5.1)
Sodium: 135 mEq/L (ref 135–145)

## 2015-06-29 LAB — POCT GLYCOSYLATED HEMOGLOBIN (HGB A1C): Hemoglobin A1C: 6.3

## 2015-06-29 LAB — POCT INR: INR: 1.7

## 2015-06-29 LAB — VITAMIN D 25 HYDROXY (VIT D DEFICIENCY, FRACTURES): VITD: 13.61 ng/mL — ABNORMAL LOW (ref 30.00–100.00)

## 2015-06-29 MED ORDER — VITAMIN D (ERGOCALCIFEROL) 1.25 MG (50000 UNIT) PO CAPS: ORAL_CAPSULE | ORAL | Status: DC

## 2015-06-29 NOTE — Patient Instructions (Signed)
Please come back for a regular physical appointment in 3 months (must be after 09/23/15).

## 2015-06-29 NOTE — Progress Notes (Signed)
Subjective:    Patient ID: [name], male    DOB: [birth year], 81 y.o.   MRN: [id number]  HPI The state of at least three ongoing medical problems is addressed today, with interval history of each noted here: Hypokalemia: pt now takes Klor, 10x20 mEq, QD.  He also takes aldactone.  He says he never misses the medication.   CHF: edema persists. Pt returns for f/u of diabetes mellitus: DM type: 2 Dx'ed: 2008 Complications: nephropathy Therapy: none DKA: never Severe hypoglycemia: never Pancreatitis: never Interval history: he did not take [place], due to cost.  He does not check cbg's.  He is also here to f/u hypokalemia and hypocalcemia: denies leg cramps.  He did not fill ergocalciferol rx. Past Medical History  Diagnosis Date  . CHF (congestive heart failure)   . HTN (hypertension)   . OA (osteoarthritis)   . BPH (benign prostatic hyperplasia)   . Obesity   . Anemia   . Diabetes mellitus type II   . Atrial fibrillation   . Cardiomyopathy   . Hyperlipidemia   . Peptic ulcer   . Bradycardia   . History of colonoscopy   . ICD (implantable cardiac defibrillator) in place 10/14/2012    biventricular  . GERD (gastroesophageal reflux disease)     Past Surgical History  Procedure Laterality Date  . L-spine  1965  . Total knee arthroplasty  1997    right  . Lumbar l4-5 & s1  02/2000  . Esophagogastroduodenoscopy  06/24/2002  . Ep implantable device Left   . Total knee arthroplasty Left 02/15/2015    Procedure: LEFT TOTAL KNEE ARTHROPLASTY;  Surgeon: Durene Romans, MD;  Location: [facility];  Service: Orthopedics;  Laterality: Left;    History   Social History  . Marital Status: Widowed    Spouse Name: N/A  . Number of Children: N/A  . Years of Education: N/A   Occupational History  . Retired    Social History Main Topics  . Smoking status: Never Smoker   . Smokeless tobacco: Never Used  . Alcohol Use: No  . Drug Use: No  . Sexual Activity: Not on file    Other Topics Concern  . Not on file   Social History Narrative    Current Outpatient Prescriptions on File Prior to Visit  Medication Sig Dispense Refill  . allopurinol (ZYLOPRIM) 100 MG tablet Take 1 tablet (100 mg total) by mouth daily. 30 tablet 2  . atorvastatin (LIPITOR) 20 MG tablet Take 1 tablet (20 mg total) by mouth daily. 30 tablet 2  . carvedilol (COREG) 12.5 MG tablet Take 1 tablet (12.5 mg total) by mouth 2 (two) times daily with a meal. 60 tablet 6  . lisinopril (PRINIVIL,ZESTRIL) 40 MG tablet Take 1 tablet (40 mg total) by mouth daily. 30 tablet 2  . metolazone (ZAROXOLYN) 2.5 MG tablet Take 1 tablet (2.5 mg total) by mouth daily. 30 tablet 2  . polyethylene glycol (MIRALAX / GLYCOLAX) packet Take 17 g by mouth 2 (two) times daily. 14 each 0  . potassium chloride SA (K-DUR,KLOR-CON) 20 MEQ tablet TAKE 5 TABLETS BY MOUTH TWICE DAILY 600 tablet 0  . spironolactone (ALDACTONE) 25 MG tablet Take 1 tablet (25 mg total) by mouth daily. 30 tablet 1  . tamsulosin (FLOMAX) 0.4 MG CAPS capsule TAKE ONE CAPSULE BY MOUTH TWICE DAILY 60 capsule 0  . warfarin (COUMADIN) 5 MG tablet Take 1 tablet by mouth daily or as directed by coumadin clinic  90 tablet 0   No current facility-administered medications on file prior to visit.    No Known Allergies  Family History  Problem Relation Age of Onset  . Cancer Neg Hx   . Heart disease Father   . Heart disease Mother   . Diabetes Mother   . Hypertension Brother   . Hypertension Brother     BP 138/90 mmHg  Pulse 75  Temp(Src) 97.5 F (36.4 C) (Oral)  Resp 16  Ht 5' 8.75" (1.746 m)  Wt 247 lb (112.038 kg)  BMI 36.75 kg/m2  SpO2 95%    Review of Systems He denies hypoglycemia and sob.      Objective:   Physical Exam VITAL SIGNS:  See vs page GENERAL: no distress Pulses: dorsalis pedis intact bilat.  Feet: no deformity. no ulcer on the feet. feet are of normal temp, but are cyanotic. 2+ bilat leg edema. bilat  varicosities. bilat rust discoloration of the legs. Heavy callus on the plantar aspect of the right foot, under the MTP joint. There is bilateral onychomycosis.   Neuro: sensation is intact to touch on the feet.    A1c=6.3%  Vit-D is low.    Assessment & Plan:  DM: well-controlled.  Please continue the same medication Hypokalemia: well-replaced.  Please continue the same KLOR Vit-D deficiency, persistent  Patient is advised the following: Patient Instructions  Please come back for a regular physical appointment in 3 months (must be after 09/23/15).  addendum: i have sent a prescription to your pharmacy, for ergocalciferol.

## 2015-06-30 LAB — PTH, INTACT AND CALCIUM
CALCIUM: 9.7 mg/dL (ref 8.4–10.5)
PTH: 85 pg/mL — AB (ref 14–64)

## 2015-07-11 ENCOUNTER — Other Ambulatory Visit: Payer: Self-pay | Admitting: Endocrinology

## 2015-07-11 ENCOUNTER — Other Ambulatory Visit: Payer: Self-pay | Admitting: Cardiology

## 2015-07-12 ENCOUNTER — Other Ambulatory Visit: Payer: Self-pay | Admitting: Endocrinology

## 2015-07-13 ENCOUNTER — Ambulatory Visit (INDEPENDENT_AMBULATORY_CARE_PROVIDER_SITE_OTHER): Payer: Medicare Other | Admitting: *Deleted

## 2015-07-13 DIAGNOSIS — I482 Chronic atrial fibrillation, unspecified: Secondary | ICD-10-CM

## 2015-07-13 DIAGNOSIS — I4891 Unspecified atrial fibrillation: Secondary | ICD-10-CM

## 2015-07-13 DIAGNOSIS — Z5181 Encounter for therapeutic drug level monitoring: Secondary | ICD-10-CM

## 2015-07-13 DIAGNOSIS — Z7901 Long term (current) use of anticoagulants: Secondary | ICD-10-CM | POA: Diagnosis not present

## 2015-07-13 LAB — POCT INR: INR: 2.5

## 2015-08-10 ENCOUNTER — Ambulatory Visit (INDEPENDENT_AMBULATORY_CARE_PROVIDER_SITE_OTHER): Payer: Medicare Other | Admitting: *Deleted

## 2015-08-10 DIAGNOSIS — Z7901 Long term (current) use of anticoagulants: Secondary | ICD-10-CM | POA: Diagnosis not present

## 2015-08-10 DIAGNOSIS — Z5181 Encounter for therapeutic drug level monitoring: Secondary | ICD-10-CM

## 2015-08-10 DIAGNOSIS — I4891 Unspecified atrial fibrillation: Secondary | ICD-10-CM

## 2015-08-10 DIAGNOSIS — I482 Chronic atrial fibrillation, unspecified: Secondary | ICD-10-CM

## 2015-08-10 LAB — POCT INR: INR: 2.3

## 2015-08-17 ENCOUNTER — Other Ambulatory Visit: Payer: Self-pay | Admitting: Endocrinology

## 2015-08-17 ENCOUNTER — Other Ambulatory Visit: Payer: Self-pay | Admitting: Cardiology

## 2015-08-29 ENCOUNTER — Ambulatory Visit (INDEPENDENT_AMBULATORY_CARE_PROVIDER_SITE_OTHER): Payer: Medicare Other | Admitting: *Deleted

## 2015-08-29 ENCOUNTER — Telehealth: Payer: Self-pay | Admitting: Cardiology

## 2015-08-29 DIAGNOSIS — I429 Cardiomyopathy, unspecified: Secondary | ICD-10-CM

## 2015-08-29 DIAGNOSIS — I5022 Chronic systolic (congestive) heart failure: Secondary | ICD-10-CM

## 2015-08-29 NOTE — Telephone Encounter (Signed)
LMOVM reminding pt to send remote transmission.   

## 2015-08-30 ENCOUNTER — Other Ambulatory Visit: Payer: Self-pay | Admitting: Endocrinology

## 2015-08-30 DIAGNOSIS — I5022 Chronic systolic (congestive) heart failure: Secondary | ICD-10-CM

## 2015-08-30 DIAGNOSIS — I429 Cardiomyopathy, unspecified: Secondary | ICD-10-CM

## 2015-08-31 NOTE — Progress Notes (Signed)
Remote ICD transmission.   

## 2015-09-05 LAB — CUP PACEART REMOTE DEVICE CHECK
Battery Voltage: 2.83 V
Brady Statistic AS VS Percent: 2.77 %
Date Time Interrogation Session: 20160920203514
HighPow Impedance: 323 Ohm
HighPow Impedance: 73 Ohm
Lead Channel Impedance Value: 342 Ohm
Lead Channel Impedance Value: 380 Ohm
Lead Channel Pacing Threshold Amplitude: 0.5 V
Lead Channel Pacing Threshold Amplitude: 1.25 V
Lead Channel Pacing Threshold Pulse Width: 1.5 ms
Lead Channel Sensing Intrinsic Amplitude: 0.25 mV
Lead Channel Sensing Intrinsic Amplitude: 5.125 mV
Lead Channel Sensing Intrinsic Amplitude: 5.125 mV
Lead Channel Setting Pacing Pulse Width: 0.4 ms
Lead Channel Setting Pacing Pulse Width: 1.5 ms
Lead Channel Setting Sensing Sensitivity: 0.3 mV
MDC IDC MSMT LEADCHNL LV IMPEDANCE VALUE: 399 Ohm
MDC IDC MSMT LEADCHNL LV IMPEDANCE VALUE: 627 Ohm
MDC IDC MSMT LEADCHNL RA IMPEDANCE VALUE: 437 Ohm
MDC IDC MSMT LEADCHNL RA SENSING INTR AMPL: 0.25 mV
MDC IDC MSMT LEADCHNL RV PACING THRESHOLD PULSEWIDTH: 0.4 ms
MDC IDC SET LEADCHNL LV PACING AMPLITUDE: 2.25 V
MDC IDC SET LEADCHNL RV PACING AMPLITUDE: 2.5 V
MDC IDC SET ZONE DETECTION INTERVAL: 270 ms
MDC IDC SET ZONE DETECTION INTERVAL: 310 ms
MDC IDC STAT BRADY AP VP PERCENT: 0 %
MDC IDC STAT BRADY AP VS PERCENT: 0 %
MDC IDC STAT BRADY AS VP PERCENT: 97.23 %
MDC IDC STAT BRADY RA PERCENT PACED: 0 %
MDC IDC STAT BRADY RV PERCENT PACED: 97.23 %
Zone Setting Detection Interval: 350 ms
Zone Setting Detection Interval: 400 ms

## 2015-09-14 ENCOUNTER — Ambulatory Visit (INDEPENDENT_AMBULATORY_CARE_PROVIDER_SITE_OTHER): Payer: Medicare Other | Admitting: *Deleted

## 2015-09-14 DIAGNOSIS — Z5181 Encounter for therapeutic drug level monitoring: Secondary | ICD-10-CM | POA: Diagnosis not present

## 2015-09-14 DIAGNOSIS — Z7901 Long term (current) use of anticoagulants: Secondary | ICD-10-CM

## 2015-09-14 DIAGNOSIS — I482 Chronic atrial fibrillation, unspecified: Secondary | ICD-10-CM

## 2015-09-14 DIAGNOSIS — I4891 Unspecified atrial fibrillation: Secondary | ICD-10-CM | POA: Diagnosis not present

## 2015-09-14 LAB — POCT INR: INR: 2

## 2015-09-20 ENCOUNTER — Encounter: Payer: Self-pay | Admitting: Cardiology

## 2015-09-26 ENCOUNTER — Ambulatory Visit (INDEPENDENT_AMBULATORY_CARE_PROVIDER_SITE_OTHER): Payer: Medicare Other | Admitting: Endocrinology

## 2015-09-26 ENCOUNTER — Encounter: Payer: Self-pay | Admitting: Endocrinology

## 2015-09-26 VITALS — BP 120/88 | HR 56 | Temp 98.7°F | Ht 68.5 in | Wt 256.0 lb

## 2015-09-26 DIAGNOSIS — Z125 Encounter for screening for malignant neoplasm of prostate: Secondary | ICD-10-CM

## 2015-09-26 DIAGNOSIS — E785 Hyperlipidemia, unspecified: Secondary | ICD-10-CM

## 2015-09-26 DIAGNOSIS — R799 Abnormal finding of blood chemistry, unspecified: Secondary | ICD-10-CM | POA: Diagnosis not present

## 2015-09-26 DIAGNOSIS — D696 Thrombocytopenia, unspecified: Secondary | ICD-10-CM | POA: Diagnosis not present

## 2015-09-26 DIAGNOSIS — Z23 Encounter for immunization: Secondary | ICD-10-CM

## 2015-09-26 DIAGNOSIS — I10 Essential (primary) hypertension: Secondary | ICD-10-CM | POA: Diagnosis not present

## 2015-09-26 DIAGNOSIS — E559 Vitamin D deficiency, unspecified: Secondary | ICD-10-CM

## 2015-09-26 DIAGNOSIS — E790 Hyperuricemia without signs of inflammatory arthritis and tophaceous disease: Secondary | ICD-10-CM | POA: Diagnosis not present

## 2015-09-26 DIAGNOSIS — Z0000 Encounter for general adult medical examination without abnormal findings: Secondary | ICD-10-CM

## 2015-09-26 DIAGNOSIS — I5022 Chronic systolic (congestive) heart failure: Secondary | ICD-10-CM

## 2015-09-26 DIAGNOSIS — K769 Liver disease, unspecified: Secondary | ICD-10-CM | POA: Diagnosis not present

## 2015-09-26 DIAGNOSIS — E1151 Type 2 diabetes mellitus with diabetic peripheral angiopathy without gangrene: Secondary | ICD-10-CM

## 2015-09-26 DIAGNOSIS — D509 Iron deficiency anemia, unspecified: Secondary | ICD-10-CM

## 2015-09-26 LAB — URINALYSIS, ROUTINE W REFLEX MICROSCOPIC
BILIRUBIN URINE: NEGATIVE
HGB URINE DIPSTICK: NEGATIVE
Ketones, ur: NEGATIVE
Leukocytes, UA: NEGATIVE
NITRITE: NEGATIVE
RBC / HPF: NONE SEEN (ref 0–?)
Specific Gravity, Urine: 1.015 (ref 1.000–1.030)
Total Protein, Urine: NEGATIVE
URINE GLUCOSE: NEGATIVE
Urobilinogen, UA: 0.2 (ref 0.0–1.0)
WBC UA: NONE SEEN (ref 0–?)
pH: 5.5 (ref 5.0–8.0)

## 2015-09-26 LAB — BASIC METABOLIC PANEL
BUN: 22 mg/dL (ref 6–23)
CHLORIDE: 106 meq/L (ref 96–112)
CO2: 25 meq/L (ref 19–32)
Calcium: 9.3 mg/dL (ref 8.4–10.5)
Creatinine, Ser: 1.01 mg/dL (ref 0.40–1.50)
GFR: 74.69 mL/min (ref >60.00)
Glucose, Bld: 111 mg/dL — ABNORMAL HIGH (ref 70–99)
POTASSIUM: 5.2 meq/L — AB (ref 3.5–5.1)
SODIUM: 137 meq/L (ref 135–145)

## 2015-09-26 LAB — CBC WITH DIFFERENTIAL/PLATELET
BASOS PCT: 0.5 % (ref 0.0–3.0)
Basophils Absolute: 0 10*3/uL (ref 0.0–0.1)
EOS PCT: 6.8 % — AB (ref 0.0–5.0)
Eosinophils Absolute: 0.5 10*3/uL (ref 0.0–0.7)
HEMATOCRIT: 39.3 % (ref 39.0–52.0)
Hemoglobin: 12.6 g/dL — ABNORMAL LOW (ref 13.0–17.0)
LYMPHS PCT: 28.8 % (ref 12.0–46.0)
Lymphs Abs: 1.9 10*3/uL (ref 0.7–4.0)
MCHC: 32.2 g/dL (ref 30.0–36.0)
MCV: 97.2 fl (ref 78.0–100.0)
MONOS PCT: 11.2 % (ref 3.0–12.0)
Monocytes Absolute: 0.7 10*3/uL (ref 0.1–1.0)
NEUTROS ABS: 3.5 10*3/uL (ref 1.4–7.7)
Neutrophils Relative %: 52.7 % (ref 43.0–77.0)
RBC: 4.04 Mil/uL — ABNORMAL LOW (ref 4.22–5.81)
RDW: 14.9 % (ref 11.5–15.5)
WBC: 6.6 10*3/uL (ref 4.0–10.5)

## 2015-09-26 LAB — LIPID PANEL
CHOLESTEROL: 159 mg/dL (ref 0–200)
HDL: 62.5 mg/dL (ref >39.00)
LDL Cholesterol: 79 mg/dL (ref 0–99)
NonHDL: 96.81
Total CHOL/HDL Ratio: 3
Triglycerides: 91 mg/dL (ref 0.0–149.0)
VLDL: 18.2 mg/dL (ref 0.0–40.0)

## 2015-09-26 LAB — HEPATIC FUNCTION PANEL
ALT: 23 U/L (ref 0–53)
AST: 22 U/L (ref 0–37)
Albumin: 4.1 g/dL (ref 3.5–5.2)
Alkaline Phosphatase: 67 U/L (ref 39–117)
BILIRUBIN DIRECT: 0.2 mg/dL (ref 0.0–0.3)
BILIRUBIN TOTAL: 0.6 mg/dL (ref 0.2–1.2)
Total Protein: 6.8 g/dL (ref 6.0–8.3)

## 2015-09-26 LAB — PSA, MEDICARE: PSA: 1.07 ng/mL (ref 0.10–4.00)

## 2015-09-26 LAB — MICROALBUMIN / CREATININE URINE RATIO
Creatinine,U: 49.6 mg/dL
MICROALB/CREAT RATIO: 1.8 mg/g (ref 0.0–30.0)
Microalb, Ur: 0.9 mg/dL (ref 0.0–1.9)

## 2015-09-26 LAB — IBC PANEL
Iron: 97 ug/dL (ref 42–165)
Saturation Ratios: 32.2 % (ref 20.0–50.0)
Transferrin: 215 mg/dL (ref 212.0–360.0)

## 2015-09-26 LAB — BRAIN NATRIURETIC PEPTIDE: PRO B NATRI PEPTIDE: 192 pg/mL — AB (ref 0.0–100.0)

## 2015-09-26 LAB — URIC ACID: Uric Acid, Serum: 4.3 mg/dL (ref 4.0–7.8)

## 2015-09-26 LAB — TSH: TSH: 2.07 u[IU]/mL (ref 0.35–4.50)

## 2015-09-26 LAB — HEMOGLOBIN A1C: HEMOGLOBIN A1C: 6.7 % — AB (ref 4.6–6.5)

## 2015-09-26 NOTE — Progress Notes (Signed)
we discussed code status.  pt requests full code, but would not want to be started or maintained on artificial life-support measures if there was not a reasonable chance of recovery 

## 2015-09-26 NOTE — Patient Instructions (Addendum)
blood tests are requested for you today.  We'll let you know about the results. please consider these measures for your health:  minimize alcohol.  do not use tobacco products.  have a colonoscopy at least every 10 years from age 81.  keep firearms safely stored.  always use seat belts.  have working smoke alarms in your home.  see an eye doctor and dentist regularly.  never drive under the influence of alcohol or drugs (including prescription drugs).  those with fair skin should take precautions against the sun. It is critically important to prevent falling down (keep floor areas well-lit, dry, and free of loose objects.  If you have a cane, walker, or wheelchair, you should use it, even for short trips around the house.  Also, try not to rush). Please come back for a follow-up appointment in 3 months.

## 2015-09-26 NOTE — Progress Notes (Signed)
Subjective:    Patient ID: [name], male    DOB: 11-Apr-1929, 81 y.o.   MRN: [id number]  HPI The state of at least three ongoing medical problems is addressed today, with interval history of each noted here: Hypokalemia: pt now takes Klor, 10x20 mEq, QD.  He also takes aldactone.  He says he never misses the medication.  Edema is unchanged CHF: he has slight doe.     Pt returns for f/u of diabetes mellitus:  DM type: 2.  Dx'ed: 2008.  Complications: nephropathy.   Therapy: none. DKA: never.  Severe hypoglycemia: never.   Pancreatitis: never.   Interval history: he did not take [place], due to cost.  He does not check cbg's.  He is also here to f/u vit-D deficiency: he denies leg cramps.  He has finished with a course of ergocalciferol.   Past Medical History  Diagnosis Date  . CHF (congestive heart failure) (HCC)   . HTN (hypertension)   . OA (osteoarthritis)   . BPH (benign prostatic hyperplasia)   . Obesity   . Anemia   . Diabetes mellitus type II   . Atrial fibrillation (HCC)   . Cardiomyopathy   . Hyperlipidemia   . Peptic ulcer   . Bradycardia   . History of colonoscopy   . ICD (implantable cardiac defibrillator) in place 10/14/2012    biventricular  . GERD (gastroesophageal reflux disease)     Past Surgical History  Procedure Laterality Date  . L-spine  1965  . Total knee arthroplasty  1997    right  . Lumbar l4-5 & s1  02/2000  . Esophagogastroduodenoscopy  06/24/2002  . Ep implantable device Left   . Total knee arthroplasty Left 02/15/2015    Procedure: LEFT TOTAL KNEE ARTHROPLASTY;  Surgeon: Durene Romans, MD;  Location: [facility];  Service: Orthopedics;  Laterality: Left;    Social History   Social History  . Marital Status: Widowed    Spouse Name: N/A  . Number of Children: N/A  . Years of Education: N/A   Occupational History  . Retired    Social History Main Topics  . Smoking status: Never Smoker   . Smokeless tobacco: Never Used  .  Alcohol Use: No  . Drug Use: No  . Sexual Activity: Not on file   Other Topics Concern  . Not on file   Social History Narrative    Current Outpatient Prescriptions on File Prior to Visit  Medication Sig Dispense Refill  . allopurinol (ZYLOPRIM) 100 MG tablet TAKE 1 TABLET BY MOUTH DAILY 90 tablet 1  . atorvastatin (LIPITOR) 20 MG tablet TAKE 1 TABLET BY MOUTH EVERY DAY 90 tablet 1  . carvedilol (COREG) 12.5 MG tablet TAKE 1 TABLET BY MOUTH TWICE DAILY WITH FOOD 180 tablet 0  . lisinopril (PRINIVIL,ZESTRIL) 40 MG tablet TAKE 1 TABLET BY MOUTH DAILY 90 tablet 1  . polyethylene glycol (MIRALAX / GLYCOLAX) packet Take 17 g by mouth 2 (two) times daily. 14 each 0  . potassium chloride SA (K-DUR,KLOR-CON) 20 MEQ tablet TAKE 5 TABLETS BY MOUTH TWICE DAILY 900 tablet 0  . spironolactone (ALDACTONE) 25 MG tablet TAKE 1 TABLET(25 MG) BY MOUTH DAILY 90 tablet 1  . tamsulosin (FLOMAX) 0.4 MG CAPS capsule TAKE 1 CAPSULE BY MOUTH TWICE DAILY 180 capsule 0  . warfarin (COUMADIN) 5 MG tablet Take 1 tablet by mouth daily or as directed by coumadin clinic 90 tablet 0   No current facility-administered medications on  file prior to visit.    No Known Allergies  Family History  Problem Relation Age of Onset  . Cancer Neg Hx   . Heart disease Father   . Heart disease Mother   . Diabetes Mother   . Hypertension Brother   . Hypertension Brother     BP 120/88 mmHg  Pulse 56  Temp(Src) 98.7 F (37.1 C) (Oral)  Ht 5' 8.5" (1.74 m)  Wt 256 lb (116.121 kg)  BMI 38.35 kg/m2  SpO2 99%  Review of Systems Denies chest pain and BRBPR    Objective:   Physical Exam VITAL SIGNS:  See vs page GENERAL: no distress LUNGS:  Clear to auscultation Pulses: dorsalis pedis intact bilat.  Feet: no deformity. no ulcer on the feet. feet are of normal temp, but are cyanotic. 2+ bilat leg edema. bilat varicosities. bilat rust discoloration of the legs. Heavy callus on the plantar aspect of the right foot, under  the MTP joint. There is bilateral onychomycosis.  Neuro: sensation is intact to touch on the feet.     BNP: is high Lab Results  Component Value Date   WBC 6.6 09/26/2015   HGB 12.6* 09/26/2015   HCT 39.3 09/26/2015   MCV 97.2 09/26/2015   PLT 133.0 Repeated and verified X2.* 09/26/2015   Lab Results  Component Value Date   CREATININE 1.01 09/26/2015   BUN 22 09/26/2015   NA 137 09/26/2015   K 5.2* 09/26/2015   CL 106 09/26/2015   CO2 25 09/26/2015  vit-D is low    Assessment & Plan:  Hypokalemia: slightly overreplaced.  We'll recheck on lasix. CHF: based on BNP and edema, he should be back on lasix.  i sent rx Thrombocytopenia, recurrent: we'll follow. Vit-D deficiency: persistent: i have sent a prescription to your pharmacy for another course of ergocalciferol.     Subjective:   Patient here for Medicare annual wellness visit and management of other chronic and acute problems.     Risk factors: advanced age    Roster of Physicians Providing Medical Care to Patient:  See "snapshot"   Activities of Daily Living: In your present state of health, do you have any difficulty performing the following activities?:  Preparing food and eating?: No  Bathing yourself: No  Getting dressed: No  Using the toilet: No  Moving around from place to place: No  In the past year have you fallen or had a near fall?:No    Home Safety: Has smoke detector and wears seat belts. No firearms. No excess sun exposure.  Diet and Exercise  Current exercise habits: as limited by health problems Dietary issues discussed: pt reports a healthy diet   Depression Screen  Q1: Over the past two weeks, have you felt down, depressed or hopeless? no  Q2: Over the past two weeks, have you felt little interest or pleasure in doing things? no   The following portions of the patient's history were reviewed and updated as appropriate: allergies, current medications, past family history, past medical  history, past social history, past surgical history and problem list.   Review of Systems  No change in chronic hearing loss; denies visual loss.   Objective:   Vision:  Curator, but does not recall name Hearing: grossly decreased Body mass index:  See vs page Msk: pt slowly performs "get-up-and-go" from a sitting position.   Cognitive Impairment Assessment: cognition, memory and judgment appear normal.  remembers 2/3 at 5 minutes.  excellent recall.  can easily read and write a sentence.  alert and oriented x 3.    Assessment:   Medicare wellness utd on preventive parameters.     Plan:   During the course of the visit the patient was educated and counseled about appropriate screening and preventive services including:        Fall prevention   Diabetes screening  Nutrition counseling   Vaccines / LABS Zostavax / Pneumococcal Vaccine  today  PSA  Patient Instructions (the written plan) was given to the patient.

## 2015-09-27 LAB — PTH, INTACT AND CALCIUM
Calcium: 9.2 mg/dL (ref 8.4–10.5)
PTH: 98 pg/mL — ABNORMAL HIGH (ref 14–64)

## 2015-09-27 LAB — VITAMIN D 25 HYDROXY (VIT D DEFICIENCY, FRACTURES): VITD: 15.9 ng/mL — AB (ref 30.00–100.00)

## 2015-09-27 MED ORDER — VITAMIN D (ERGOCALCIFEROL) 1.25 MG (50000 UNIT) PO CAPS: ORAL_CAPSULE | ORAL | Status: DC

## 2015-09-27 MED ORDER — FUROSEMIDE 80 MG PO TABS: 80.0000 mg | ORAL_TABLET | Freq: Every day | ORAL | Status: DC

## 2015-09-29 ENCOUNTER — Encounter: Payer: Self-pay | Admitting: Internal Medicine

## 2015-10-04 ENCOUNTER — Encounter: Payer: Self-pay | Admitting: Cardiology

## 2015-10-19 ENCOUNTER — Other Ambulatory Visit: Payer: Self-pay | Admitting: Endocrinology

## 2015-10-20 NOTE — Telephone Encounter (Signed)
Please advise if ok to refill. Medication is not on current list.

## 2015-10-26 ENCOUNTER — Ambulatory Visit (INDEPENDENT_AMBULATORY_CARE_PROVIDER_SITE_OTHER): Payer: Medicare Other | Admitting: *Deleted

## 2015-10-26 DIAGNOSIS — I482 Chronic atrial fibrillation, unspecified: Secondary | ICD-10-CM

## 2015-10-26 DIAGNOSIS — Z7901 Long term (current) use of anticoagulants: Secondary | ICD-10-CM | POA: Diagnosis not present

## 2015-10-26 DIAGNOSIS — Z5181 Encounter for therapeutic drug level monitoring: Secondary | ICD-10-CM | POA: Diagnosis not present

## 2015-10-26 DIAGNOSIS — I4891 Unspecified atrial fibrillation: Secondary | ICD-10-CM | POA: Diagnosis not present

## 2015-10-26 LAB — POCT INR: INR: 2.7

## 2015-10-31 NOTE — Progress Notes (Signed)
HPI: FU nonischemic cardiomyopathy as well as atrial fibrillation. Note, he had a catheterization in January 2006 that showed no coronary artery disease and an ejection fraction of 40%. He had a Myoview performed last on May 04, 2008, that showed an ejection fraction of 31%. There was felt to be a prior inferior infarct with mild peri-infarct ischemia. I did review this and felt there was a low risk and we have been treating medically. He does have permanent atrial fibrillation as well. Holter monitor in August of 2011 showed mildly reduced rate and we decreased his Coreg. His last echocardiogram was performed in August of 2013. His ejection fraction was 30-35%. There was mild to moderate mitral regurgitation. The left atrium was mild to moderately dilated as was the right atrium. In November of 2013 the patient had a biventricular ICD placed. Since I last saw him, He has mild dyspnea on exertion but no orthopnea, PND, chest pain or syncope. Chronic pedal edema.  Current Outpatient Prescriptions  Medication Sig Dispense Refill  . allopurinol (ZYLOPRIM) 100 MG tablet TAKE 1 TABLET BY MOUTH DAILY 90 tablet 1  . atorvastatin (LIPITOR) 20 MG tablet TAKE 1 TABLET BY MOUTH EVERY DAY 90 tablet 1  . carvedilol (COREG) 12.5 MG tablet TAKE 1 TABLET BY MOUTH TWICE DAILY WITH FOOD 180 tablet 0  . lisinopril (PRINIVIL,ZESTRIL) 40 MG tablet TAKE 1 TABLET BY MOUTH DAILY 90 tablet 1  . metolazone (ZAROXOLYN) 2.5 MG tablet TAKE 1 TABLET BY MOUTH DAILY 90 tablet 0  . polyethylene glycol (MIRALAX / GLYCOLAX) packet Take 17 g by mouth 2 (two) times daily. 14 each 0  . potassium chloride SA (K-DUR,KLOR-CON) 20 MEQ tablet TAKE 5 TABLETS BY MOUTH TWICE DAILY 900 tablet 0  . spironolactone (ALDACTONE) 25 MG tablet TAKE 1 TABLET(25 MG) BY MOUTH DAILY 90 tablet 1  . tamsulosin (FLOMAX) 0.4 MG CAPS capsule TAKE 1 CAPSULE BY MOUTH TWICE DAILY 180 capsule 0  . Vitamin D, Ergocalciferol, (DRISDOL) 50000 UNITS CAPS capsule  1 tab, twice a week 12 capsule 0  . warfarin (COUMADIN) 5 MG tablet Take 1 tablet by mouth daily or as directed by coumadin clinic 90 tablet 0   No current facility-administered medications for this visit.     Past Medical History  Diagnosis Date  . CHF (congestive heart failure) (HCC)   . HTN (hypertension)   . OA (osteoarthritis)   . BPH (benign prostatic hyperplasia)   . Obesity   . Anemia   . Diabetes mellitus type II   . Atrial fibrillation (HCC)   . Cardiomyopathy   . Hyperlipidemia   . Peptic ulcer   . Bradycardia   . History of colonoscopy   . ICD (implantable cardiac defibrillator) in place 10/14/2012    biventricular  . GERD (gastroesophageal reflux disease)     Past Surgical History  Procedure Laterality Date  . L-spine  1965  . Total knee arthroplasty  1997    right  . Lumbar l4-5 & s1  02/2000  . Esophagogastroduodenoscopy  06/24/2002  . Ep implantable device Left   . Total knee arthroplasty Left 02/15/2015    Procedure: LEFT TOTAL KNEE ARTHROPLASTY;  Surgeon: Durene Romans, MD;  Location: [facility];  Service: Orthopedics;  Laterality: Left;    Social History   Social History  . Marital Status: Widowed    Spouse Name: N/A  . Number of Children: N/A  . Years of Education: N/A   Occupational History  .  Retired    Social History Main Topics  . Smoking status: Never Smoker   . Smokeless tobacco: Never Used  . Alcohol Use: No  . Drug Use: No  . Sexual Activity: Not on file   Other Topics Concern  . Not on file   Social History Narrative    ROS: Arthralgias but no fevers or chills, productive cough, hemoptysis, dysphasia, odynophagia, melena, hematochezia, dysuria, hematuria, rash, seizure activity, orthopnea, PND, claudication. Remaining systems are negative.  Physical Exam: Well-developed well-nourished in no acute distress.  Skin is warm and dry.  HEENT is normal.  Neck is supple.  Chest is clear to auscultation with normal expansion.    Cardiovascular exam is regular rate and rhythm.  Abdominal exam nontender or distended. No masses palpated. Extremities show 2+ ankle edema and chronic skin changes. neuro grossly intact  ECG Ventricular paced rhythm.

## 2015-11-07 ENCOUNTER — Ambulatory Visit (INDEPENDENT_AMBULATORY_CARE_PROVIDER_SITE_OTHER): Payer: Medicare Other | Admitting: Cardiology

## 2015-11-07 ENCOUNTER — Encounter: Payer: Self-pay | Admitting: Cardiology

## 2015-11-07 VITALS — BP 114/76 | HR 74 | Ht 68.0 in | Wt 258.0 lb

## 2015-11-07 DIAGNOSIS — E785 Hyperlipidemia, unspecified: Secondary | ICD-10-CM

## 2015-11-07 DIAGNOSIS — Z95810 Presence of automatic (implantable) cardiac defibrillator: Secondary | ICD-10-CM

## 2015-11-07 DIAGNOSIS — I482 Chronic atrial fibrillation, unspecified: Secondary | ICD-10-CM

## 2015-11-07 DIAGNOSIS — I10 Essential (primary) hypertension: Secondary | ICD-10-CM

## 2015-11-07 DIAGNOSIS — I5022 Chronic systolic (congestive) heart failure: Secondary | ICD-10-CM | POA: Diagnosis not present

## 2015-11-07 NOTE — Patient Instructions (Signed)
Your physician wants you to follow-up in: ONE YEAR WITH DR Shelda Pal will receive a reminder letter in the mail two months in advance. If you don't receive a letter, please call our office to schedule the follow-up appointment.   If you need a refill on your cardiac medications before your next appointment, please call your pharmacy.   ELIQUIS TO REPLACE WARFARIN

## 2015-11-07 NOTE — Assessment & Plan Note (Signed)
Continue statin. 

## 2015-11-07 NOTE — Assessment & Plan Note (Addendum)
Continue present dose of diuretics. 

## 2015-11-07 NOTE — Assessment & Plan Note (Signed)
Blood pressure controlled. Continue present medications. 

## 2015-11-07 NOTE — Assessment & Plan Note (Signed)
Followed by electrophysiology. 

## 2015-11-07 NOTE — Assessment & Plan Note (Addendum)
Continue beta blocker for rate control. Continue Coumadin. I have given him the names of apixaban and xarelto to patient. He will check to see about the cost and contact us if he would like to change from coumadin.

## 2015-11-07 NOTE — Assessment & Plan Note (Signed)
Continue beta blocker and ACE inhibitor. 

## 2015-12-06 ENCOUNTER — Telehealth: Payer: Self-pay | Admitting: Cardiology

## 2015-12-06 ENCOUNTER — Ambulatory Visit (INDEPENDENT_AMBULATORY_CARE_PROVIDER_SITE_OTHER): Payer: Medicare Other | Admitting: *Deleted

## 2015-12-06 DIAGNOSIS — I5022 Chronic systolic (congestive) heart failure: Secondary | ICD-10-CM

## 2015-12-06 DIAGNOSIS — I429 Cardiomyopathy, unspecified: Secondary | ICD-10-CM | POA: Diagnosis not present

## 2015-12-06 NOTE — Telephone Encounter (Signed)
Confirmed remote transmission w/ pt daughter.   

## 2015-12-07 ENCOUNTER — Ambulatory Visit (INDEPENDENT_AMBULATORY_CARE_PROVIDER_SITE_OTHER): Payer: Medicare Other | Admitting: Pharmacist

## 2015-12-07 DIAGNOSIS — Z7901 Long term (current) use of anticoagulants: Secondary | ICD-10-CM | POA: Diagnosis not present

## 2015-12-07 DIAGNOSIS — Z5181 Encounter for therapeutic drug level monitoring: Secondary | ICD-10-CM

## 2015-12-07 DIAGNOSIS — I4891 Unspecified atrial fibrillation: Secondary | ICD-10-CM | POA: Diagnosis not present

## 2015-12-07 DIAGNOSIS — I482 Chronic atrial fibrillation, unspecified: Secondary | ICD-10-CM

## 2015-12-07 LAB — POCT INR: INR: 1.6

## 2015-12-07 NOTE — Progress Notes (Signed)
Remote ICD transmission.   

## 2015-12-08 ENCOUNTER — Encounter: Payer: Self-pay | Admitting: Cardiology

## 2015-12-08 LAB — CUP PACEART REMOTE DEVICE CHECK
Brady Statistic AP VP Percent: 0 %
Brady Statistic AS VP Percent: 97.48 %
Brady Statistic AS VS Percent: 2.52 %
Date Time Interrogation Session: 20161227234850
HIGH POWER IMPEDANCE MEASURED VALUE: 304 Ohm
HighPow Impedance: 67 Ohm
Implantable Lead Implant Date: 20131105
Implantable Lead Implant Date: 20131105
Implantable Lead Location: 753858
Implantable Lead Model: 5076
Implantable Lead Model: 6935
Lead Channel Impedance Value: 342 Ohm
Lead Channel Impedance Value: 399 Ohm
Lead Channel Impedance Value: 399 Ohm
Lead Channel Impedance Value: 627 Ohm
Lead Channel Sensing Intrinsic Amplitude: 0.25 mV
MDC IDC LEAD IMPLANT DT: 20131105
MDC IDC LEAD LOCATION: 753859
MDC IDC LEAD LOCATION: 753860
MDC IDC LEAD MODEL: 4296
MDC IDC MSMT BATTERY VOLTAGE: 2.68 V
MDC IDC MSMT LEADCHNL LV PACING THRESHOLD AMPLITUDE: 1.375 V
MDC IDC MSMT LEADCHNL LV PACING THRESHOLD PULSEWIDTH: 1.5 ms
MDC IDC MSMT LEADCHNL RA SENSING INTR AMPL: 0.25 mV
MDC IDC MSMT LEADCHNL RV IMPEDANCE VALUE: 380 Ohm
MDC IDC MSMT LEADCHNL RV PACING THRESHOLD AMPLITUDE: 0.625 V
MDC IDC MSMT LEADCHNL RV PACING THRESHOLD PULSEWIDTH: 0.4 ms
MDC IDC MSMT LEADCHNL RV SENSING INTR AMPL: 3.25 mV
MDC IDC MSMT LEADCHNL RV SENSING INTR AMPL: 3.25 mV
MDC IDC SET LEADCHNL LV PACING AMPLITUDE: 2.5 V
MDC IDC SET LEADCHNL LV PACING PULSEWIDTH: 1.5 ms
MDC IDC SET LEADCHNL RV PACING AMPLITUDE: 2.5 V
MDC IDC SET LEADCHNL RV PACING PULSEWIDTH: 0.4 ms
MDC IDC SET LEADCHNL RV SENSING SENSITIVITY: 0.3 mV
MDC IDC STAT BRADY AP VS PERCENT: 0 %
MDC IDC STAT BRADY RA PERCENT PACED: 0 %
MDC IDC STAT BRADY RV PERCENT PACED: 97.48 %

## 2015-12-20 ENCOUNTER — Other Ambulatory Visit: Payer: Self-pay | Admitting: Endocrinology

## 2015-12-21 ENCOUNTER — Other Ambulatory Visit: Payer: Self-pay | Admitting: Cardiology

## 2015-12-21 ENCOUNTER — Other Ambulatory Visit: Payer: Self-pay

## 2015-12-21 ENCOUNTER — Telehealth: Payer: Self-pay | Admitting: Cardiology

## 2015-12-21 ENCOUNTER — Telehealth: Payer: Self-pay | Admitting: Endocrinology

## 2015-12-21 MED ORDER — ATORVASTATIN CALCIUM 20 MG PO TABS: 20.0000 mg | ORAL_TABLET | Freq: Every day | ORAL | Status: DC

## 2015-12-21 MED ORDER — POTASSIUM CHLORIDE CRYS ER 20 MEQ PO TBCR: EXTENDED_RELEASE_TABLET | ORAL | Status: DC

## 2015-12-21 MED ORDER — LISINOPRIL 40 MG PO TABS: 40.0000 mg | ORAL_TABLET | Freq: Every day | ORAL | Status: DC

## 2015-12-21 MED ORDER — TAMSULOSIN HCL 0.4 MG PO CAPS: 0.4000 mg | ORAL_CAPSULE | Freq: Two times a day (BID) | ORAL | Status: DC

## 2015-12-21 MED ORDER — CARVEDILOL 12.5 MG PO TABS: 12.5000 mg | ORAL_TABLET | Freq: Two times a day (BID) | ORAL | Status: DC

## 2015-12-21 MED ORDER — ALLOPURINOL 100 MG PO TABS: 100.0000 mg | ORAL_TABLET | Freq: Every day | ORAL | Status: DC

## 2015-12-21 NOTE — Telephone Encounter (Signed)
[employer] is faxing over a request for all patient medication to be sent to [facility] mail order.

## 2015-12-21 NOTE — Telephone Encounter (Signed)
Medications have been sent electronically.

## 2015-12-21 NOTE — Telephone Encounter (Signed)
°*  STAT* If patient is at the pharmacy, call can be transferred to refill team.   1. Which medications need to be refilled? (please list name of each medication and dose if known)Carvedilol-new prescriptions 2. Which pharmacy/location (including street and city if local pharmacy) is medication to be sent to?[facility]-[phone]  3. Do they need a 30 day or 90 day supply? 10

## 2015-12-21 NOTE — Telephone Encounter (Signed)
Rx request sent to pharmacy.  

## 2015-12-23 ENCOUNTER — Other Ambulatory Visit: Payer: Self-pay | Admitting: *Deleted

## 2015-12-23 MED ORDER — CARVEDILOL 12.5 MG PO TABS: 12.5000 mg | ORAL_TABLET | Freq: Two times a day (BID) | ORAL | Status: DC

## 2015-12-26 ENCOUNTER — Encounter: Payer: Self-pay | Admitting: Cardiology

## 2015-12-27 ENCOUNTER — Encounter: Payer: Self-pay | Admitting: Endocrinology

## 2015-12-27 ENCOUNTER — Ambulatory Visit (INDEPENDENT_AMBULATORY_CARE_PROVIDER_SITE_OTHER): Payer: Commercial Managed Care - HMO | Admitting: Endocrinology

## 2015-12-27 VITALS — BP 132/72 | HR 89 | Temp 97.5°F | Ht 68.0 in | Wt 260.0 lb

## 2015-12-27 DIAGNOSIS — E1151 Type 2 diabetes mellitus with diabetic peripheral angiopathy without gangrene: Secondary | ICD-10-CM | POA: Diagnosis not present

## 2015-12-27 DIAGNOSIS — I5022 Chronic systolic (congestive) heart failure: Secondary | ICD-10-CM

## 2015-12-27 DIAGNOSIS — D509 Iron deficiency anemia, unspecified: Secondary | ICD-10-CM | POA: Diagnosis not present

## 2015-12-27 DIAGNOSIS — E559 Vitamin D deficiency, unspecified: Secondary | ICD-10-CM

## 2015-12-27 DIAGNOSIS — E876 Hypokalemia: Secondary | ICD-10-CM | POA: Diagnosis not present

## 2015-12-27 LAB — CBC WITH DIFFERENTIAL/PLATELET
BASOS ABS: 0 10*3/uL (ref 0.0–0.1)
Basophils Relative: 0.4 % (ref 0.0–3.0)
Eosinophils Absolute: 0.6 10*3/uL (ref 0.0–0.7)
Eosinophils Relative: 8.5 % — ABNORMAL HIGH (ref 0.0–5.0)
HCT: 38.3 % — ABNORMAL LOW (ref 39.0–52.0)
Hemoglobin: 12.6 g/dL — ABNORMAL LOW (ref 13.0–17.0)
LYMPHS ABS: 1.7 10*3/uL (ref 0.7–4.0)
Lymphocytes Relative: 25.7 % (ref 12.0–46.0)
MCHC: 33 g/dL (ref 30.0–36.0)
MCV: 96.1 fl (ref 78.0–100.0)
MONO ABS: 0.8 10*3/uL (ref 0.1–1.0)
MONOS PCT: 11.5 % (ref 3.0–12.0)
NEUTROS ABS: 3.5 10*3/uL (ref 1.4–7.7)
NEUTROS PCT: 53.9 % (ref 43.0–77.0)
PLATELETS: 137 10*3/uL — AB (ref 150.0–400.0)
RBC: 3.98 Mil/uL — ABNORMAL LOW (ref 4.22–5.81)
RDW: 15.2 % (ref 11.5–15.5)
WBC: 6.5 10*3/uL (ref 4.0–10.5)

## 2015-12-27 LAB — IBC PANEL
IRON: 68 ug/dL (ref 42–165)
Saturation Ratios: 22.3 % (ref 20.0–50.0)
TRANSFERRIN: 218 mg/dL (ref 212.0–360.0)

## 2015-12-27 LAB — BASIC METABOLIC PANEL
BUN: 27 mg/dL — ABNORMAL HIGH (ref 6–23)
CALCIUM: 8.9 mg/dL (ref 8.4–10.5)
CO2: 24 mEq/L (ref 19–32)
Chloride: 106 mEq/L (ref 96–112)
Creatinine, Ser: 0.94 mg/dL (ref 0.40–1.50)
GFR: 81.09 mL/min (ref >60.00)
GLUCOSE: 114 mg/dL — AB (ref 70–99)
Potassium: 4.8 mEq/L (ref 3.5–5.1)
SODIUM: 136 meq/L (ref 135–145)

## 2015-12-27 LAB — BRAIN NATRIURETIC PEPTIDE: Pro B Natriuretic peptide (BNP): 99 pg/mL (ref 0.0–100.0)

## 2015-12-27 LAB — VITAMIN D 25 HYDROXY (VIT D DEFICIENCY, FRACTURES): VITD: 15.71 ng/mL — AB (ref 30.00–100.00)

## 2015-12-27 LAB — POCT GLYCOSYLATED HEMOGLOBIN (HGB A1C): HEMOGLOBIN A1C: 6.5

## 2015-12-27 MED ORDER — VITAMIN D (ERGOCALCIFEROL) 1.25 MG (50000 UNIT) PO CAPS: ORAL_CAPSULE | ORAL | Status: DC

## 2015-12-27 MED ORDER — POTASSIUM CHLORIDE CRYS ER 20 MEQ PO TBCR: 80.0000 meq | EXTENDED_RELEASE_TABLET | Freq: Two times a day (BID) | ORAL | Status: DC

## 2015-12-27 NOTE — Progress Notes (Signed)
   Subjective:    Patient ID: [name], male    DOB: 16-Jan-1929, 82 y.o.   MRN: [id number]  HPI The state of at least three ongoing medical problems is addressed today, with interval history of each noted here: Hypokalemia: pt now takes Klor, 10x20 mEq, QD.  He also takes aldactone.  He says he never misses the medication.  Edema is unchanged.  CHF: he has slight doe.     Pt returns for f/u of diabetes mellitus:  DM type: 2.  Dx'ed: 2008.  Complications: nephropathy.   Therapy: none. DKA: never.  Severe hypoglycemia: never.   Pancreatitis: never.   Interval history: he did not take [place], due to cost.  He does not take metformin, due to CHF; he does not check cbg's.  Denies weight change.  He is also here to f/u vit-D deficiency: he denies leg cramps.  He has finished with another course of ergocalciferol.   Anemia: denies hematuria.  He does not take any fe supplement.  CHF: he gets good diuretic effect from lasix.     Review of Systems Denies sob and BRBPR.    Objective:   Physical Exam VITAL SIGNS:  See vs page GENERAL: no distress LUNGS:  Clear to auscultation Pulses: dorsalis pedis intact bilat.  Feet: no deformity. . 2+ bilat leg edema. bilat varicosities.  There is bilateral onychomycosis.   Skin: bilat rust discoloration of the legs. Heavy callus on the plantar aspect of the right foot, under the MTP joint.  no ulcer on the feet. feet are of normal temp, but are cyanotic. Neuro: sensation is intact to touch on the feet.    A1c=6.5% Vit=low Lab Results  Component Value Date   WBC 6.5 12/27/2015   HGB 12.6* 12/27/2015   HCT 38.3* 12/27/2015   MCV 96.1 12/27/2015   PLT 137.0* 12/27/2015   fe is normal Lab Results  Component Value Date   CREATININE 0.94 12/27/2015   BUN 27* 12/27/2015   NA 136 12/27/2015   K 4.8 12/27/2015   CL 106 12/27/2015   CO2 24 12/27/2015       Assessment & Plan:  Hypokalemia: slightly overcontrolled Vit-D  deficiency, persistent CHF: better.  Please continue the same medication. DM: well-controlled.  Please continue the same medication Anemia: mild, uncertain etiology   Patient is advised the following: Patient Instructions  blood tests are requested for you today.  We'll let you know about the results. Please come back for a follow-up appointment in 3 months.     addendum: Please reduce the potassium to 4 pills, twice a day.  The vitamin-D is still low. Please take the ergocalciferol again. i have sent a prescription to your pharmacy. We'll recheck the anemia next time.

## 2015-12-27 NOTE — Patient Instructions (Signed)
blood tests are requested for you today.  We'll let you know about the results.   Please come back for a follow-up appointment in 3 months.   

## 2015-12-28 ENCOUNTER — Ambulatory Visit (INDEPENDENT_AMBULATORY_CARE_PROVIDER_SITE_OTHER): Payer: Commercial Managed Care - HMO | Admitting: *Deleted

## 2015-12-28 DIAGNOSIS — Z7901 Long term (current) use of anticoagulants: Secondary | ICD-10-CM | POA: Diagnosis not present

## 2015-12-28 DIAGNOSIS — Z5181 Encounter for therapeutic drug level monitoring: Secondary | ICD-10-CM | POA: Diagnosis not present

## 2015-12-28 DIAGNOSIS — I482 Chronic atrial fibrillation, unspecified: Secondary | ICD-10-CM

## 2015-12-28 DIAGNOSIS — I4891 Unspecified atrial fibrillation: Secondary | ICD-10-CM | POA: Diagnosis not present

## 2015-12-28 LAB — POCT INR: INR: 2.5

## 2016-01-03 ENCOUNTER — Telehealth: Payer: Self-pay | Admitting: Cardiology

## 2016-01-03 NOTE — Telephone Encounter (Signed)
°*  STAT* If patient is at the pharmacy, call can be transferred to refill team.   1. Which medications need to be refilled? (please list name of each medication and dose if known)Warfarin--needs enough until mail order comes  2. Which pharmacy/location (including street and city if local pharmacy) is medication to be sent to?[facility]-[phone]  3. Do they need a 30 day or 90 day supply? 10

## 2016-01-04 ENCOUNTER — Other Ambulatory Visit: Payer: Self-pay | Admitting: *Deleted

## 2016-01-04 ENCOUNTER — Telehealth: Payer: Self-pay | Admitting: Endocrinology

## 2016-01-04 ENCOUNTER — Other Ambulatory Visit: Payer: Self-pay | Admitting: Cardiology

## 2016-01-04 ENCOUNTER — Telehealth: Payer: Self-pay | Admitting: Cardiology

## 2016-01-04 MED ORDER — METOLAZONE 2.5 MG PO TABS: 2.5000 mg | ORAL_TABLET | Freq: Every day | ORAL | Status: DC

## 2016-01-04 MED ORDER — WARFARIN SODIUM 5 MG PO TABS: ORAL_TABLET | ORAL | Status: DC

## 2016-01-04 MED ORDER — SPIRONOLACTONE 25 MG PO TABS: ORAL_TABLET | ORAL | Status: DC

## 2016-01-04 NOTE — Telephone Encounter (Signed)
Refill sent to the pharmacy electronically.  

## 2016-01-04 NOTE — Telephone Encounter (Signed)
[facility] pharmacy to dispense for #10 [phone] refills for warfarin.

## 2016-01-04 NOTE — Telephone Encounter (Signed)
°*  STAT* If patient is at the pharmacy, call can be transferred to refill team.   1. Which medications need to be refilled? (please list name of each medication and dose if known) Carvedilol and Warfarin-pt wants to make sure his medivine will be here on time,he does not want to wait like he did this time  2. Which pharmacy/location (including street and city if local pharmacy) is medication to be sent to?[facility]  3. Do they need a 30 day or 90 day supply? 90 and refills

## 2016-01-04 NOTE — Telephone Encounter (Signed)
PATIENT REQUEST 10 TABLETS UNTIL SUPPLY COMES FROM MAIL ORDER

## 2016-01-04 NOTE — Telephone Encounter (Signed)
Rx sent sent per pt's request.

## 2016-01-04 NOTE — Telephone Encounter (Signed)
Patient daughter called stating that Mr. Remund needs Rx sent to mail order pharmacy.  Rx: Metolazone  Spironolactone   Pharmacy: [facility]   Thank you

## 2016-01-05 MED ORDER — WARFARIN SODIUM 5 MG PO TABS: ORAL_TABLET | ORAL | Status: DC

## 2016-01-25 ENCOUNTER — Ambulatory Visit (INDEPENDENT_AMBULATORY_CARE_PROVIDER_SITE_OTHER): Payer: Commercial Managed Care - HMO | Admitting: *Deleted

## 2016-01-25 DIAGNOSIS — I482 Chronic atrial fibrillation, unspecified: Secondary | ICD-10-CM

## 2016-01-25 DIAGNOSIS — Z5181 Encounter for therapeutic drug level monitoring: Secondary | ICD-10-CM

## 2016-01-25 DIAGNOSIS — Z7901 Long term (current) use of anticoagulants: Secondary | ICD-10-CM

## 2016-01-25 DIAGNOSIS — I4891 Unspecified atrial fibrillation: Secondary | ICD-10-CM | POA: Diagnosis not present

## 2016-01-25 LAB — POCT INR: INR: 2.4

## 2016-02-22 ENCOUNTER — Ambulatory Visit (INDEPENDENT_AMBULATORY_CARE_PROVIDER_SITE_OTHER): Payer: Commercial Managed Care - HMO | Admitting: Surgery

## 2016-02-22 DIAGNOSIS — Z7901 Long term (current) use of anticoagulants: Secondary | ICD-10-CM | POA: Diagnosis not present

## 2016-02-22 DIAGNOSIS — I4891 Unspecified atrial fibrillation: Secondary | ICD-10-CM

## 2016-02-22 DIAGNOSIS — I482 Chronic atrial fibrillation, unspecified: Secondary | ICD-10-CM

## 2016-02-22 DIAGNOSIS — Z5181 Encounter for therapeutic drug level monitoring: Secondary | ICD-10-CM

## 2016-02-22 LAB — POCT INR: INR: 2.3

## 2016-03-06 ENCOUNTER — Telehealth: Payer: Self-pay | Admitting: Cardiology

## 2016-03-06 ENCOUNTER — Ambulatory Visit (INDEPENDENT_AMBULATORY_CARE_PROVIDER_SITE_OTHER): Payer: Commercial Managed Care - HMO | Admitting: *Deleted

## 2016-03-06 DIAGNOSIS — I482 Chronic atrial fibrillation, unspecified: Secondary | ICD-10-CM

## 2016-03-06 DIAGNOSIS — Z95810 Presence of automatic (implantable) cardiac defibrillator: Secondary | ICD-10-CM

## 2016-03-06 DIAGNOSIS — I5022 Chronic systolic (congestive) heart failure: Secondary | ICD-10-CM

## 2016-03-06 NOTE — Telephone Encounter (Signed)
LMOVM reminding pt to send remote transmission.   

## 2016-03-06 NOTE — Progress Notes (Signed)
Remote ICD transmission.   

## 2016-03-16 ENCOUNTER — Other Ambulatory Visit: Payer: Self-pay | Admitting: *Deleted

## 2016-03-16 LAB — CUP PACEART REMOTE DEVICE CHECK
Battery Voltage: 2.63 V
Brady Statistic AP VP Percent: 0 %
Brady Statistic AS VP Percent: 97.53 %
Brady Statistic RA Percent Paced: 0 %
HighPow Impedance: 304 Ohm
HighPow Impedance: 73 Ohm
Implantable Lead Implant Date: 20131105
Implantable Lead Implant Date: 20131105
Implantable Lead Location: 753859
Implantable Lead Model: 4296
Implantable Lead Model: 5076
Implantable Lead Model: 6935
Lead Channel Impedance Value: 342 Ohm
Lead Channel Impedance Value: 437 Ohm
Lead Channel Impedance Value: 646 Ohm
Lead Channel Pacing Threshold Amplitude: 0.5 V
Lead Channel Pacing Threshold Pulse Width: 0.4 ms
Lead Channel Sensing Intrinsic Amplitude: 4.5 mV
Lead Channel Setting Pacing Amplitude: 2.25 V
Lead Channel Setting Pacing Amplitude: 2.5 V
Lead Channel Setting Pacing Pulse Width: 1.5 ms
MDC IDC LEAD IMPLANT DT: 20131105
MDC IDC LEAD LOCATION: 753858
MDC IDC LEAD LOCATION: 753860
MDC IDC MSMT LEADCHNL LV IMPEDANCE VALUE: 342 Ohm
MDC IDC MSMT LEADCHNL LV IMPEDANCE VALUE: 437 Ohm
MDC IDC MSMT LEADCHNL LV PACING THRESHOLD AMPLITUDE: 1.125 V
MDC IDC MSMT LEADCHNL LV PACING THRESHOLD PULSEWIDTH: 1.5 ms
MDC IDC MSMT LEADCHNL RA SENSING INTR AMPL: 0.25 mV
MDC IDC MSMT LEADCHNL RA SENSING INTR AMPL: 0.25 mV
MDC IDC MSMT LEADCHNL RV SENSING INTR AMPL: 4.5 mV
MDC IDC SESS DTM: 20170328195028
MDC IDC SET LEADCHNL RV PACING PULSEWIDTH: 0.4 ms
MDC IDC SET LEADCHNL RV SENSING SENSITIVITY: 0.3 mV
MDC IDC STAT BRADY AP VS PERCENT: 0 %
MDC IDC STAT BRADY AS VS PERCENT: 2.47 %
MDC IDC STAT BRADY RV PERCENT PACED: 97.53 %

## 2016-03-16 MED ORDER — CARVEDILOL 12.5 MG PO TABS: 12.5000 mg | ORAL_TABLET | Freq: Two times a day (BID) | ORAL | Status: DC

## 2016-03-16 NOTE — Telephone Encounter (Signed)
Rx request sent to pharmacy.  

## 2016-03-20 ENCOUNTER — Encounter: Payer: Self-pay | Admitting: Cardiology

## 2016-03-26 ENCOUNTER — Ambulatory Visit (INDEPENDENT_AMBULATORY_CARE_PROVIDER_SITE_OTHER): Payer: Commercial Managed Care - HMO | Admitting: Endocrinology

## 2016-03-26 ENCOUNTER — Encounter: Payer: Self-pay | Admitting: Endocrinology

## 2016-03-26 ENCOUNTER — Other Ambulatory Visit: Payer: Self-pay

## 2016-03-26 ENCOUNTER — Telehealth: Payer: Self-pay | Admitting: Endocrinology

## 2016-03-26 VITALS — BP 126/84 | HR 95 | Temp 97.8°F | Ht 68.5 in | Wt 259.0 lb

## 2016-03-26 DIAGNOSIS — I5022 Chronic systolic (congestive) heart failure: Secondary | ICD-10-CM

## 2016-03-26 DIAGNOSIS — E785 Hyperlipidemia, unspecified: Secondary | ICD-10-CM

## 2016-03-26 DIAGNOSIS — E1151 Type 2 diabetes mellitus with diabetic peripheral angiopathy without gangrene: Secondary | ICD-10-CM

## 2016-03-26 DIAGNOSIS — E876 Hypokalemia: Secondary | ICD-10-CM

## 2016-03-26 DIAGNOSIS — Z23 Encounter for immunization: Secondary | ICD-10-CM

## 2016-03-26 DIAGNOSIS — K769 Liver disease, unspecified: Secondary | ICD-10-CM

## 2016-03-26 DIAGNOSIS — E790 Hyperuricemia without signs of inflammatory arthritis and tophaceous disease: Secondary | ICD-10-CM

## 2016-03-26 DIAGNOSIS — Z125 Encounter for screening for malignant neoplasm of prostate: Secondary | ICD-10-CM

## 2016-03-26 DIAGNOSIS — I10 Essential (primary) hypertension: Secondary | ICD-10-CM

## 2016-03-26 DIAGNOSIS — D696 Thrombocytopenia, unspecified: Secondary | ICD-10-CM

## 2016-03-26 DIAGNOSIS — E559 Vitamin D deficiency, unspecified: Secondary | ICD-10-CM

## 2016-03-26 DIAGNOSIS — D509 Iron deficiency anemia, unspecified: Secondary | ICD-10-CM | POA: Diagnosis not present

## 2016-03-26 LAB — CBC WITH DIFFERENTIAL/PLATELET
Basophils Absolute: 0 10*3/uL (ref 0.0–0.1)
Basophils Relative: 0.5 % (ref 0.0–3.0)
EOS PCT: 5.7 % — AB (ref 0.0–5.0)
Eosinophils Absolute: 0.3 10*3/uL (ref 0.0–0.7)
HCT: 35.6 % — ABNORMAL LOW (ref 39.0–52.0)
Hemoglobin: 11.9 g/dL — ABNORMAL LOW (ref 13.0–17.0)
LYMPHS ABS: 1.5 10*3/uL (ref 0.7–4.0)
Lymphocytes Relative: 25.4 % (ref 12.0–46.0)
MCHC: 33.4 g/dL (ref 30.0–36.0)
MCV: 96.4 fl (ref 78.0–100.0)
MONO ABS: 0.7 10*3/uL (ref 0.1–1.0)
MONOS PCT: 11.6 % (ref 3.0–12.0)
NEUTROS ABS: 3.3 10*3/uL (ref 1.4–7.7)
NEUTROS PCT: 56.8 % (ref 43.0–77.0)
PLATELETS: 113 10*3/uL — AB (ref 150.0–400.0)
RBC: 3.69 Mil/uL — AB (ref 4.22–5.81)
RDW: 14.6 % (ref 11.5–15.5)
WBC: 5.8 10*3/uL (ref 4.0–10.5)

## 2016-03-26 LAB — BASIC METABOLIC PANEL
BUN: 39 mg/dL — ABNORMAL HIGH (ref 6–23)
CALCIUM: 9.4 mg/dL (ref 8.4–10.5)
CO2: 21 mEq/L (ref 19–32)
Chloride: 106 mEq/L (ref 96–112)
Creatinine, Ser: 1.14 mg/dL (ref 0.40–1.50)
GFR: 64.87 mL/min (ref >60.00)
GLUCOSE: 113 mg/dL — AB (ref 70–99)
Potassium: 6 mEq/L — ABNORMAL HIGH (ref 3.5–5.1)
SODIUM: 134 meq/L — AB (ref 135–145)

## 2016-03-26 LAB — VITAMIN D 25 HYDROXY (VIT D DEFICIENCY, FRACTURES): VITD: 21.56 ng/mL — ABNORMAL LOW (ref 30.00–100.00)

## 2016-03-26 LAB — POCT GLYCOSYLATED HEMOGLOBIN (HGB A1C): Hemoglobin A1C: 6.5

## 2016-03-26 LAB — BRAIN NATRIURETIC PEPTIDE: PRO B NATRI PEPTIDE: 181 pg/mL — AB (ref 0.0–100.0)

## 2016-03-26 MED ORDER — TORSEMIDE 20 MG PO TABS: 20.0000 mg | ORAL_TABLET | Freq: Every day | ORAL | Status: DC

## 2016-03-26 MED ORDER — METOLAZONE 2.5 MG PO TABS: 2.5000 mg | ORAL_TABLET | Freq: Every day | ORAL | Status: DC

## 2016-03-26 MED ORDER — TAMSULOSIN HCL 0.4 MG PO CAPS: 0.4000 mg | ORAL_CAPSULE | Freq: Two times a day (BID) | ORAL | Status: DC

## 2016-03-26 NOTE — Telephone Encounter (Signed)
i called patient's dtr: Just wanted to make sure you got the message about stopping KLOR and rechecking BMET dtr says ok, got it.

## 2016-03-26 NOTE — Patient Instructions (Addendum)
Losing weight helps the blood sugar, but you don't need medication for this.  blood tests are requested for you today.  We'll let you know about the results.  Based on the results, i'll prescribe for you a different type of fluid pill called "torsemide."   Please come back for a follow-up appointment in 3 months.

## 2016-03-26 NOTE — Progress Notes (Signed)
Subjective:    Patient ID: [name], male    DOB: Jun 05, 1929, 82 y.o.   MRN: [id number]  HPI The state of at least three ongoing medical problems is addressed today, with interval history of each noted here: Hypokalemia: pt now takes Klor, 8x20 mEq, QD.  He also takes aldactone.  He says he never misses the medication.  Edema is unchanged.  CHF: he has slight doe.     Pt returns for f/u of diabetes mellitus:  DM type: 2.  Dx'ed: 2008.  Complications: nephropathy.   Therapy: none. DKA: never.  Severe hypoglycemia: never.   Pancreatitis: never.   Interval history: he did not take [place], due to cost.  He does not take metformin, due to CHF; he does not check cbg's.  Denies weight change.  He is also here to f/u vit-D deficiency: he denies leg cramps.  He refuses ergocalciferol.  CHF: he seldom takes lasix, but he gets a minimal diuretic effect.  dtr says the lasix is always too much or too little, so they want a different pill.   Past Medical History  Diagnosis Date  . CHF (congestive heart failure) (HCC)   . HTN (hypertension)   . OA (osteoarthritis)   . BPH (benign prostatic hyperplasia)   . Obesity   . Anemia   . Diabetes mellitus type II   . Atrial fibrillation (HCC)   . Cardiomyopathy   . Hyperlipidemia   . Peptic ulcer   . Bradycardia   . History of colonoscopy   . ICD (implantable cardiac defibrillator) in place 10/14/2012    biventricular  . GERD (gastroesophageal reflux disease)     Past Surgical History  Procedure Laterality Date  . L-spine  1965  . Total knee arthroplasty  1997    right  . Lumbar l4-5 & s1  02/2000  . Esophagogastroduodenoscopy  06/24/2002  . Ep implantable device Left   . Total knee arthroplasty Left 02/15/2015    Procedure: LEFT TOTAL KNEE ARTHROPLASTY;  Surgeon: Durene Romans, MD;  Location: [facility];  Service: Orthopedics;  Laterality: Left;    Social History   Social History  . Marital Status: Widowed    Spouse Name: N/A  .  Number of Children: N/A  . Years of Education: N/A   Occupational History  . Retired    Social History Main Topics  . Smoking status: Never Smoker   . Smokeless tobacco: Never Used  . Alcohol Use: No  . Drug Use: No  . Sexual Activity: Not on file   Other Topics Concern  . Not on file   Social History Narrative    Current Outpatient Prescriptions on File Prior to Visit  Medication Sig Dispense Refill  . allopurinol (ZYLOPRIM) 100 MG tablet Take 1 tablet (100 mg total) by mouth daily. 90 tablet 1  . atorvastatin (LIPITOR) 20 MG tablet Take 1 tablet (20 mg total) by mouth daily. 90 tablet 1  . carvedilol (COREG) 12.5 MG tablet Take 1 tablet (12.5 mg total) by mouth 2 (two) times daily with a meal. 180 tablet 2  . lisinopril (PRINIVIL,ZESTRIL) 40 MG tablet Take 1 tablet (40 mg total) by mouth daily. 90 tablet 1  . polyethylene glycol (MIRALAX / GLYCOLAX) packet Take 17 g by mouth 2 (two) times daily. 14 each 0  . spironolactone (ALDACTONE) 25 MG tablet TAKE 1 TABLET(25 MG) BY MOUTH DAILY 90 tablet 2  . warfarin (COUMADIN) 5 MG tablet TAKE 1 TABLET BY MOUTH DAILY  AS DIRECTED BY COUMADIN CLINIC 90 tablet 1   No current facility-administered medications on file prior to visit.    No Known Allergies  Family History  Problem Relation Age of Onset  . Cancer Neg Hx   . Heart disease Father   . Heart disease Mother   . Diabetes Mother   . Hypertension Brother   . Hypertension Brother     BP 126/84 mmHg  Pulse 95  Temp(Src) 97.8 F (36.6 C) (Oral)  Ht 5' 8.5" (1.74 m)  Wt 259 lb (117.482 kg)  BMI 38.80 kg/m2  SpO2 94%  Review of Systems No weight change.  He gets doe with 1/2 flight of stairs.      Objective:   Physical Exam VITAL SIGNS: See vs page GENERAL: no distress LUNGS: Clear to auscultation Pulses: dorsalis pedis intact bilat.  Feet: no deformity.  2+ bilat leg edema. bilat varicosities. There is bilateral onychomycosis.   Skin: bilat rust  discoloration of the legs. Heavy callus on the plantar aspect of the right foot, under the MTP joint. no ulcer on the feet. feet are of normal temp, but are cyanotic.  Neuro: sensation is intact to touch on the feet.     Lab Results  Component Value Date   CREATININE 1.14 03/26/2016   BUN 39* 03/26/2016   NA 134* 03/26/2016   K 6.0* 03/26/2016   CL 106 03/26/2016   CO2 21 03/26/2016  BNP=181 Vit-D=18 Lab Results  Component Value Date   WBC 5.8 03/26/2016   HGB 11.9* 03/26/2016   HCT 35.6* 03/26/2016   MCV 96.4 03/26/2016   PLT 113.0* 03/26/2016      Assessment & Plan:  Hypokalemia: overreplaced CHF: persistent Vit-D deficiency, persistent Anemia: we'll follow, given his advanced age and poor health.    Patient is advised the following: Patient Instructions  Losing weight helps the blood sugar, but you don't need medication for this.  blood tests are requested for you today.  We'll let you know about the results.  Based on the results, i'll prescribe for you a different type of fluid pill called "torsemide."   Please come back for a follow-up appointment in 3 months.    addendum: top taking the potassium pills, and recheck here in the office, tomorrow (Tuesday). You do not need to be fasting. I have sent a prescription to your pharmacy, for the "torsemide" fluid pill. You should take non-prescription vitamin-D, 2000 units per day.

## 2016-03-29 ENCOUNTER — Other Ambulatory Visit: Payer: Self-pay | Admitting: Endocrinology

## 2016-03-29 ENCOUNTER — Other Ambulatory Visit (INDEPENDENT_AMBULATORY_CARE_PROVIDER_SITE_OTHER): Payer: Commercial Managed Care - HMO

## 2016-03-29 ENCOUNTER — Telehealth: Payer: Self-pay | Admitting: Endocrinology

## 2016-03-29 DIAGNOSIS — E876 Hypokalemia: Secondary | ICD-10-CM

## 2016-03-29 LAB — BASIC METABOLIC PANEL
BUN: 37 mg/dL — ABNORMAL HIGH (ref 6–23)
CALCIUM: 9.2 mg/dL (ref 8.4–10.5)
CO2: 21 meq/L (ref 19–32)
CREATININE: 1.02 mg/dL (ref 0.40–1.50)
Chloride: 106 mEq/L (ref 96–112)
GFR: 73.76 mL/min (ref >60.00)
Glucose, Bld: 125 mg/dL — ABNORMAL HIGH (ref 70–99)
Potassium: 5.6 mEq/L — ABNORMAL HIGH (ref 3.5–5.1)
SODIUM: 131 meq/L — AB (ref 135–145)

## 2016-03-29 MED ORDER — TAMSULOSIN HCL 0.4 MG PO CAPS: 0.4000 mg | ORAL_CAPSULE | Freq: Two times a day (BID) | ORAL | Status: DC

## 2016-03-29 NOTE — Telephone Encounter (Signed)
Rx submitted per pt's request.  

## 2016-03-29 NOTE — Telephone Encounter (Signed)
PT said that [facility] did not receive the Tamsulosin Rx and needs that to be called in.

## 2016-04-03 ENCOUNTER — Encounter: Payer: Self-pay | Admitting: Cardiology

## 2016-04-04 ENCOUNTER — Ambulatory Visit (INDEPENDENT_AMBULATORY_CARE_PROVIDER_SITE_OTHER): Payer: Commercial Managed Care - HMO | Admitting: *Deleted

## 2016-04-04 DIAGNOSIS — I4891 Unspecified atrial fibrillation: Secondary | ICD-10-CM

## 2016-04-04 DIAGNOSIS — Z7901 Long term (current) use of anticoagulants: Secondary | ICD-10-CM | POA: Diagnosis not present

## 2016-04-04 DIAGNOSIS — I482 Chronic atrial fibrillation, unspecified: Secondary | ICD-10-CM

## 2016-04-04 DIAGNOSIS — Z5181 Encounter for therapeutic drug level monitoring: Secondary | ICD-10-CM | POA: Diagnosis not present

## 2016-04-04 LAB — POCT INR: INR: 2.9

## 2016-04-10 ENCOUNTER — Telehealth: Payer: Self-pay | Admitting: Endocrinology

## 2016-04-10 NOTE — Telephone Encounter (Signed)
I contacted the pt's daughter and advised Dr. Everardo All would like for the pt to stay off the potassium for now and to please come in for blood test this week to have blood tests check. She voiced understanding and stated she would call back to schedule the lab appointment once she talks to the pt.

## 2016-04-10 NOTE — Telephone Encounter (Signed)
PT daughter called checking in on the potassium level that was checked and wants to know if he needs to continue not taking the potassium pills or if he needs to start back.

## 2016-04-11 ENCOUNTER — Other Ambulatory Visit (INDEPENDENT_AMBULATORY_CARE_PROVIDER_SITE_OTHER): Payer: Commercial Managed Care - HMO

## 2016-04-11 ENCOUNTER — Other Ambulatory Visit: Payer: Self-pay | Admitting: Endocrinology

## 2016-04-11 DIAGNOSIS — E876 Hypokalemia: Secondary | ICD-10-CM | POA: Diagnosis not present

## 2016-04-11 LAB — BASIC METABOLIC PANEL
BUN: 59 mg/dL — ABNORMAL HIGH (ref 6–23)
CHLORIDE: 101 meq/L (ref 96–112)
CO2: 25 mEq/L (ref 19–32)
Calcium: 9.3 mg/dL (ref 8.4–10.5)
Creatinine, Ser: 1.13 mg/dL (ref 0.40–1.50)
GFR: 65.53 mL/min (ref >60.00)
Glucose, Bld: 131 mg/dL — ABNORMAL HIGH (ref 70–99)
POTASSIUM: 4.2 meq/L (ref 3.5–5.1)
SODIUM: 134 meq/L — AB (ref 135–145)

## 2016-04-11 MED ORDER — TORSEMIDE 10 MG PO TABS
10.0000 mg | ORAL_TABLET | Freq: Every day | ORAL | Status: DC
Start: 2016-04-11 — End: 2016-06-18

## 2016-05-16 ENCOUNTER — Ambulatory Visit (INDEPENDENT_AMBULATORY_CARE_PROVIDER_SITE_OTHER): Payer: Commercial Managed Care - HMO | Admitting: *Deleted

## 2016-05-16 DIAGNOSIS — Z5181 Encounter for therapeutic drug level monitoring: Secondary | ICD-10-CM

## 2016-05-16 DIAGNOSIS — I482 Chronic atrial fibrillation, unspecified: Secondary | ICD-10-CM

## 2016-05-16 DIAGNOSIS — Z7901 Long term (current) use of anticoagulants: Secondary | ICD-10-CM

## 2016-05-16 DIAGNOSIS — I4891 Unspecified atrial fibrillation: Secondary | ICD-10-CM

## 2016-05-16 LAB — POCT INR: INR: 2.4

## 2016-05-21 ENCOUNTER — Other Ambulatory Visit: Payer: Self-pay | Admitting: Cardiology

## 2016-05-21 ENCOUNTER — Other Ambulatory Visit: Payer: Self-pay | Admitting: Endocrinology

## 2016-06-18 ENCOUNTER — Ambulatory Visit (INDEPENDENT_AMBULATORY_CARE_PROVIDER_SITE_OTHER): Payer: Commercial Managed Care - HMO | Admitting: Endocrinology

## 2016-06-18 ENCOUNTER — Encounter: Payer: Self-pay | Admitting: Endocrinology

## 2016-06-18 ENCOUNTER — Telehealth: Payer: Self-pay | Admitting: Endocrinology

## 2016-06-18 VITALS — BP 104/74 | HR 87 | Wt 262.2 lb

## 2016-06-18 DIAGNOSIS — E1151 Type 2 diabetes mellitus with diabetic peripheral angiopathy without gangrene: Secondary | ICD-10-CM

## 2016-06-18 LAB — BASIC METABOLIC PANEL
BUN: 70 mg/dL — AB (ref 6–23)
CALCIUM: 9.5 mg/dL (ref 8.4–10.5)
CHLORIDE: 99 meq/L (ref 96–112)
CO2: 28 mEq/L (ref 19–32)
CREATININE: 1.38 mg/dL (ref 0.40–1.50)
GFR: 52.01 mL/min — AB (ref >60.00)
Glucose, Bld: 135 mg/dL — ABNORMAL HIGH (ref 70–99)
Potassium: 4.2 mEq/L (ref 3.5–5.1)
Sodium: 134 mEq/L — ABNORMAL LOW (ref 135–145)

## 2016-06-18 LAB — POCT GLYCOSYLATED HEMOGLOBIN (HGB A1C): HEMOGLOBIN A1C: 6.7

## 2016-06-18 NOTE — Patient Instructions (Addendum)
blood tests are requested for you today.  We'll let you know about the results.  Based on the results, you probably ned to resume the potassium, at a lower amount.   Please come back for a regular physical appointment in 3 months.

## 2016-06-18 NOTE — Progress Notes (Signed)
Subjective:    Patient ID: [name], male    DOB: Jun 15, 1929, 82 y.o.   MRN: [id number]  HPI  The state of at least three ongoing medical problems is addressed today, with interval history of each noted here: Pt returns for f/u of diabetes mellitus:  DM type: 2.  Dx'ed: 2008.  Complications: nephropathy.   Therapy: none. DKA: never.  Severe hypoglycemia: never.   Pancreatitis: never.   Interval history: He has gained weight Hypokalemia: he denies cramps.  He has not recently taken [facility].   Renal insufficiency: he denies dizziness Past Medical History  Diagnosis Date  . CHF (congestive heart failure) (HCC)   . HTN (hypertension)   . OA (osteoarthritis)   . BPH (benign prostatic hyperplasia)   . Obesity   . Anemia   . Diabetes mellitus type II   . Atrial fibrillation (HCC)   . Cardiomyopathy   . Hyperlipidemia   . Peptic ulcer   . Bradycardia   . History of colonoscopy   . ICD (implantable cardiac defibrillator) in place 10/14/2012    biventricular  . GERD (gastroesophageal reflux disease)     Past Surgical History  Procedure Laterality Date  . L-spine  1965  . Total knee arthroplasty  1997    right  . Lumbar l4-5 & s1  02/2000  . Esophagogastroduodenoscopy  06/24/2002  . Ep implantable device Left   . Total knee arthroplasty Left 02/15/2015    Procedure: LEFT TOTAL KNEE ARTHROPLASTY;  Surgeon: Durene Romans, MD;  Location: [facility];  Service: Orthopedics;  Laterality: Left;    Social History   Social History  . Marital Status: Widowed    Spouse Name: N/A  . Number of Children: N/A  . Years of Education: N/A   Occupational History  . Retired    Social History Main Topics  . Smoking status: Never Smoker   . Smokeless tobacco: Never Used  . Alcohol Use: No  . Drug Use: No  . Sexual Activity: Not on file   Other Topics Concern  . Not on file   Social History Narrative    Current Outpatient Prescriptions on File Prior to Visit  Medication Sig  Dispense Refill  . carvedilol (COREG) 12.5 MG tablet Take 1 tablet (12.5 mg total) by mouth 2 (two) times daily with a meal. 180 tablet 2  . metolazone (ZAROXOLYN) 2.5 MG tablet Take 1 tablet (2.5 mg total) by mouth daily. 90 tablet 2  . polyethylene glycol (MIRALAX / GLYCOLAX) packet Take 17 g by mouth 2 (two) times daily. 14 each 0  . spironolactone (ALDACTONE) 25 MG tablet TAKE 1 TABLET(25 MG) BY MOUTH DAILY 90 tablet 2  . tamsulosin (FLOMAX) 0.4 MG CAPS capsule Take 1 capsule (0.4 mg total) by mouth 2 (two) times daily. 180 capsule 2  . warfarin (COUMADIN) 5 MG tablet TAKE 1 TABLET BY MOUTH DAILY AS DIRECTED BY COUMADIN CLINIC 90 tablet 1   No current facility-administered medications on file prior to visit.    No Known Allergies  Family History  Problem Relation Age of Onset  . Cancer Neg Hx   . Heart disease Father   . Heart disease Mother   . Diabetes Mother   . Hypertension Brother   . Hypertension Brother     BP 104/74 mmHg  Pulse 87  Wt 262 lb 3.2 oz (118.933 kg)  SpO2 95%   Review of Systems Denies sob and edema    Objective:   Physical  Exam VITAL SIGNS:  See vs page GENERAL: no distress Pulses: dorsalis pedis intact bilat.  Feet: no deformity.  2+ bilat leg edema. bilat vv's. There is bilateral onychomycosis.   Skin: bilat rust discoloration of the legs. Heavy callus on the plantar aspect of the right foot, under the MTP joint. no ulcer on the feet. feet are of normal temp, but are cyanotic.  Neuro: sensation is intact to touch on the feet.    Lab Results  Component Value Date   HGBA1C 6.7 06/18/2016   Lab Results  Component Value Date   CREATININE 1.38 06/18/2016   BUN 70* 06/18/2016   NA 134* 06/18/2016   K 4.2 06/18/2016   CL 99 06/18/2016   CO2 28 06/18/2016      Assessment & Plan:  DM: well-controlled on no medication for now Hypokalemia: no KLOR is needed now Renal insufficiency worse: d/c torsemide  Patient is advised the  following: Patient Instructions  blood tests are requested for you today.  We'll let you know about the results.  Based on the results, you probably ned to resume the potassium, at a lower amount.   Please come back for a regular physical appointment in 3 months.     Romero Belling, MD

## 2016-06-18 NOTE — Telephone Encounter (Signed)
error 

## 2016-06-21 ENCOUNTER — Encounter: Payer: Self-pay | Admitting: Internal Medicine

## 2016-06-21 ENCOUNTER — Ambulatory Visit (INDEPENDENT_AMBULATORY_CARE_PROVIDER_SITE_OTHER): Payer: Commercial Managed Care - HMO | Admitting: *Deleted

## 2016-06-21 ENCOUNTER — Ambulatory Visit (INDEPENDENT_AMBULATORY_CARE_PROVIDER_SITE_OTHER): Payer: Commercial Managed Care - HMO | Admitting: Internal Medicine

## 2016-06-21 VITALS — BP 100/62 | HR 88 | Ht 69.0 in | Wt 259.1 lb

## 2016-06-21 DIAGNOSIS — I4891 Unspecified atrial fibrillation: Secondary | ICD-10-CM | POA: Diagnosis not present

## 2016-06-21 DIAGNOSIS — Z5181 Encounter for therapeutic drug level monitoring: Secondary | ICD-10-CM

## 2016-06-21 DIAGNOSIS — I482 Chronic atrial fibrillation, unspecified: Secondary | ICD-10-CM

## 2016-06-21 DIAGNOSIS — Z7901 Long term (current) use of anticoagulants: Secondary | ICD-10-CM

## 2016-06-21 DIAGNOSIS — I429 Cardiomyopathy, unspecified: Secondary | ICD-10-CM

## 2016-06-21 DIAGNOSIS — I5022 Chronic systolic (congestive) heart failure: Secondary | ICD-10-CM

## 2016-06-21 DIAGNOSIS — Z95810 Presence of automatic (implantable) cardiac defibrillator: Secondary | ICD-10-CM

## 2016-06-21 LAB — CUP PACEART INCLINIC DEVICE CHECK
Battery Voltage: 2.61 V
Brady Statistic AS VP Percent: 93.58 %
Brady Statistic RA Percent Paced: 0 %
Date Time Interrogation Session: 20170713161403
HighPow Impedance: 304 Ohm
HighPow Impedance: 70 Ohm
Implantable Lead Implant Date: 20131105
Implantable Lead Location: 753859
Implantable Lead Model: 4296
Implantable Lead Model: 5076
Lead Channel Impedance Value: 646 Ohm
Lead Channel Pacing Threshold Amplitude: 0.625 V
Lead Channel Pacing Threshold Pulse Width: 0.4 ms
Lead Channel Setting Pacing Amplitude: 2.5 V
Lead Channel Setting Pacing Amplitude: 2.75 V
Lead Channel Setting Pacing Pulse Width: 0.4 ms
Lead Channel Setting Pacing Pulse Width: 1.5 ms
Lead Channel Setting Sensing Sensitivity: 0.3 mV
MDC IDC LEAD IMPLANT DT: 20131105
MDC IDC LEAD IMPLANT DT: 20131105
MDC IDC LEAD LOCATION: 753858
MDC IDC LEAD LOCATION: 753860
MDC IDC LEAD MODEL: 6935
MDC IDC MSMT LEADCHNL LV IMPEDANCE VALUE: 342 Ohm
MDC IDC MSMT LEADCHNL LV IMPEDANCE VALUE: 456 Ohm
MDC IDC MSMT LEADCHNL LV PACING THRESHOLD AMPLITUDE: 1.625 V
MDC IDC MSMT LEADCHNL LV PACING THRESHOLD PULSEWIDTH: 1.5 ms
MDC IDC MSMT LEADCHNL RA IMPEDANCE VALUE: 437 Ohm
MDC IDC MSMT LEADCHNL RA SENSING INTR AMPL: 0.375 mV
MDC IDC MSMT LEADCHNL RV IMPEDANCE VALUE: 380 Ohm
MDC IDC MSMT LEADCHNL RV SENSING INTR AMPL: 11.25 mV
MDC IDC STAT BRADY AP VP PERCENT: 0 %
MDC IDC STAT BRADY AP VS PERCENT: 0 %
MDC IDC STAT BRADY AS VS PERCENT: 6.42 %
MDC IDC STAT BRADY RV PERCENT PACED: 93.58 %

## 2016-06-21 LAB — POCT INR: INR: 3

## 2016-06-21 NOTE — Patient Instructions (Addendum)
Medication Instructions:  Your physician recommends that you continue on your current medications as directed. Please refer to the Current Medication list given to you today.   Labwork: Your physician recommends that you return for lab work on 07/20/16 at 2pm   Testing/Procedures: Your physician has requested that you have an echocardiogram. Echocardiography is a painless test that uses sound waves to create images of your heart. It provides your doctor with information about the size and shape of your heart and how well your heart's chambers and valves are working. This procedure takes approximately one hour. There are no restrictions for this procedure.--will call you with results of test    Bi V ICD generator change on 07/27/16 at 7:30am Please arrive as close to 5:30am as possible Do not eat or drink after midnight the night before your procedure HOLD Coumadin for 3 days prior to the procedure Use scrub as directed  Follow-Up:  Your physician recommends that you schedule a follow-up appointment in: 10-14 days from 07/27/16 in the device clinic and 3 months from 07/27/16 with Dr Ladona Ridgel   Any Other Special Instructions Will Be Listed Below (If Applicable).     If you need a refill on your cardiac medications before your next appointment, please call your pharmacy.

## 2016-06-21 NOTE — Progress Notes (Signed)
HPI Mr. Buckwalter returns today for followup. He is an 82 year old man with a nonischemic cardiomyopathy, chronic systolic heart failure, paroxysmal atrial fibrillation, status post biventricular ICD implantation. His heart failure symptoms have improved from class III to class II. His peripheral edema is much better. He denies syncope or ICD shock. No fever or chills. He has occasional fullness in his chest. He denies chest pain.  Since his last visit, he has been stable.  He denies chest pain or sob. No residual pain from his knee surgery. He is walking much better.  No Known Allergies   Current Outpatient Prescriptions  Medication Sig Dispense Refill  . allopurinol (ZYLOPRIM) 100 MG tablet Take 100 mg by mouth daily.    [name] atorvastatin (LIPITOR) 20 MG tablet Take 20 mg by mouth daily.    . carvedilol (COREG) 12.5 MG tablet Take 1 tablet (12.5 mg total) by mouth 2 (two) times daily with a meal. 180 tablet 2  . lisinopril (PRINIVIL,ZESTRIL) 40 MG tablet Take 40 mg by mouth daily.    . metolazone (ZAROXOLYN) 2.5 MG tablet Take 1 tablet (2.5 mg total) by mouth daily. 90 tablet 2  . polyethylene glycol (MIRALAX / GLYCOLAX) packet Take 17 g by mouth 2 (two) times daily. 14 each 0  . spironolactone (ALDACTONE) 25 MG tablet TAKE 1 TABLET(25 MG) BY MOUTH DAILY 90 tablet 2  . tamsulosin (FLOMAX) 0.4 MG CAPS capsule Take 1 capsule (0.4 mg total) by mouth 2 (two) times daily. 180 capsule 2  . torsemide (DEMADEX) 10 MG tablet Take 1 tablet by mouth every other day.    . warfarin (COUMADIN) 5 MG tablet TAKE 1 TABLET BY MOUTH DAILY AS DIRECTED BY COUMADIN CLINIC 90 tablet 1   No current facility-administered medications for this visit.     Past Medical History  Diagnosis Date  . CHF (congestive heart failure) (HCC)   . HTN (hypertension)   . OA (osteoarthritis)   . BPH (benign prostatic hyperplasia)   . Obesity   . Anemia   . Diabetes mellitus type II   . Atrial fibrillation (HCC)   .  Cardiomyopathy   . Hyperlipidemia   . Peptic ulcer   . Bradycardia   . History of colonoscopy   . ICD (implantable cardiac defibrillator) in place 10/14/2012    biventricular  . GERD (gastroesophageal reflux disease)     ROS:   All systems reviewed and negative except as noted in the HPI.   Past Surgical History  Procedure Laterality Date  . L-spine  1965  . Total knee arthroplasty  1997    right  . Lumbar l4-5 & s1  02/2000  . Esophagogastroduodenoscopy  06/24/2002  . Ep implantable device Left   . Total knee arthroplasty Left 02/15/2015    Procedure: LEFT TOTAL KNEE ARTHROPLASTY;  Surgeon: Durene Romans, MD;  Location: [facility];  Service: Orthopedics;  Laterality: Left;     Family History  Problem Relation Age of Onset  . Cancer Neg Hx   . Heart disease Father   . Heart disease Mother   . Diabetes Mother   . Hypertension Brother   . Hypertension Brother      Social History   Social History  . Marital Status: Widowed    Spouse Name: N/A  . Number of Children: N/A  . Years of Education: N/A   Occupational History  . Retired    Social History Main Topics  . Smoking status: Never Smoker   . Smokeless tobacco:  Never Used  . Alcohol Use: No  . Drug Use: No  . Sexual Activity: Not on file   Other Topics Concern  . Not on file   Social History Narrative     BP 100/62 mmHg  Pulse 88  Ht 5\' 9"  (1.753 m)  Wt 259 lb 1.9 oz (117.536 kg)  BMI 38.25 kg/m2  Physical Exam:  Well appearing 82 year old man,NAD HEENT: Unremarkable Neck:  7 cm JVD, no thyromegally Lungs:  Clear with no wheezes, rales, or rhonchi. Well-healed ICD incision. HEART:  Regular rate rhythm, no murmurs, no rubs, no clicks Abd:  soft, positive bowel sounds, no organomegally, no rebound, no guarding Ext:  2 plus pulses, no edema, no cyanosis, no clubbing Skin:  No rashes no nodules Neuro:  CN II through XII intact, motor grossly intact  DEVICE  Normal device function.  See PaceArt for  details.   Assess/Plan: 1. Chronic systolic heart failure - his symptoms remain class 2. He will continue his current meds. 2. Atrial fib - he is now chronically in atrial fib. Will follow. His rate is controlled. 3. ICD - his device is now at [facility]. Will schedule gen change. Will obtain a 2D echo to see if he needs an ICD or BiV PPM  Gregg Taylor,M.D.

## 2016-06-22 ENCOUNTER — Telehealth: Payer: Self-pay

## 2016-06-22 ENCOUNTER — Telehealth: Payer: Self-pay | Admitting: Endocrinology

## 2016-06-22 NOTE — Telephone Encounter (Signed)
Left message for daughter debra to return phone call to discuss lab work.

## 2016-06-22 NOTE — Telephone Encounter (Signed)
Patient daughter is calling for patient results.

## 2016-06-25 ENCOUNTER — Telehealth: Payer: Self-pay

## 2016-06-25 ENCOUNTER — Ambulatory Visit: Payer: Commercial Managed Care - HMO | Admitting: Endocrinology

## 2016-06-25 NOTE — Telephone Encounter (Signed)
I contacted the pt's daughter and left a vm advising of note released to the MyChart Account. Note is listed below. Pt's daughter advised to call back if she had any questions.  Your potassium is normal, so you don't need any medication for this now. Your kidneys are worse, so please stop taking the torsemide for now.

## 2016-06-25 NOTE — Telephone Encounter (Signed)
Patient returning your call.

## 2016-06-25 NOTE — Telephone Encounter (Signed)
PT daughter returning nurse phone call

## 2016-06-25 NOTE — Telephone Encounter (Signed)
Returning daughter phone call about labs for father, no answer, left message to return phone call.

## 2016-07-02 ENCOUNTER — Ambulatory Visit: Payer: Commercial Managed Care - HMO | Admitting: Endocrinology

## 2016-07-06 ENCOUNTER — Ambulatory Visit (HOSPITAL_COMMUNITY): Payer: Commercial Managed Care - HMO | Attending: Internal Medicine

## 2016-07-06 ENCOUNTER — Other Ambulatory Visit (HOSPITAL_COMMUNITY): Payer: Self-pay

## 2016-07-06 DIAGNOSIS — I340 Nonrheumatic mitral (valve) insufficiency: Secondary | ICD-10-CM | POA: Insufficient documentation

## 2016-07-06 DIAGNOSIS — I5022 Chronic systolic (congestive) heart failure: Secondary | ICD-10-CM | POA: Insufficient documentation

## 2016-07-06 DIAGNOSIS — E119 Type 2 diabetes mellitus without complications: Secondary | ICD-10-CM | POA: Diagnosis not present

## 2016-07-06 DIAGNOSIS — E785 Hyperlipidemia, unspecified: Secondary | ICD-10-CM | POA: Insufficient documentation

## 2016-07-06 DIAGNOSIS — I428 Other cardiomyopathies: Secondary | ICD-10-CM | POA: Insufficient documentation

## 2016-07-06 DIAGNOSIS — Z6838 Body mass index (BMI) 38.0-38.9, adult: Secondary | ICD-10-CM | POA: Diagnosis not present

## 2016-07-06 DIAGNOSIS — I110 Hypertensive heart disease with heart failure: Secondary | ICD-10-CM | POA: Insufficient documentation

## 2016-07-06 DIAGNOSIS — I509 Heart failure, unspecified: Secondary | ICD-10-CM | POA: Diagnosis present

## 2016-07-06 DIAGNOSIS — E669 Obesity, unspecified: Secondary | ICD-10-CM | POA: Insufficient documentation

## 2016-07-11 ENCOUNTER — Telehealth: Payer: Self-pay | Admitting: Internal Medicine

## 2016-07-11 NOTE — Telephone Encounter (Signed)
Daughter aware of results and all questions answered in regards to procedure

## 2016-07-11 NOTE — Telephone Encounter (Signed)
Follow-up     The daughter is returning the nurses call the daughter could not hear what was being said on the recording

## 2016-07-20 ENCOUNTER — Other Ambulatory Visit: Payer: Commercial Managed Care - HMO | Admitting: *Deleted

## 2016-07-20 DIAGNOSIS — Z7901 Long term (current) use of anticoagulants: Secondary | ICD-10-CM | POA: Diagnosis not present

## 2016-07-20 DIAGNOSIS — Z95810 Presence of automatic (implantable) cardiac defibrillator: Secondary | ICD-10-CM | POA: Diagnosis not present

## 2016-07-20 LAB — CBC WITH DIFFERENTIAL/PLATELET
BASOS PCT: 1 %
Basophils Absolute: 66 cells/uL (ref 0–200)
EOS ABS: 330 {cells}/uL (ref 15–500)
EOS PCT: 5 %
HCT: 34.3 % — ABNORMAL LOW (ref 38.5–50.0)
Hemoglobin: 11.2 g/dL — ABNORMAL LOW (ref 13.2–17.1)
Lymphocytes Relative: 28 %
Lymphs Abs: 1848 cells/uL (ref 850–3900)
MCH: 32.1 pg (ref 27.0–33.0)
MCHC: 32.7 g/dL (ref 32.0–36.0)
MCV: 98.3 fL (ref 80.0–100.0)
MONOS PCT: 11 %
MPV: 11.7 fL (ref 7.5–12.5)
Monocytes Absolute: 726 cells/uL (ref 200–950)
NEUTROS ABS: 3630 {cells}/uL (ref 1500–7800)
Neutrophils Relative %: 55 %
PLATELETS: 160 10*3/uL (ref 140–400)
RBC: 3.49 MIL/uL — ABNORMAL LOW (ref 4.20–5.80)
RDW: 14.7 % (ref 11.0–15.0)
WBC: 6.6 10*3/uL (ref 3.8–10.8)

## 2016-07-20 LAB — BASIC METABOLIC PANEL
BUN: 30 mg/dL — AB (ref 7–25)
CHLORIDE: 106 mmol/L (ref 98–110)
CO2: 22 mmol/L (ref 20–31)
Calcium: 8.9 mg/dL (ref 8.6–10.3)
Creat: 0.92 mg/dL (ref 0.70–1.11)
Glucose, Bld: 120 mg/dL — ABNORMAL HIGH (ref 65–99)
POTASSIUM: 4.9 mmol/L (ref 3.5–5.3)
SODIUM: 137 mmol/L (ref 135–146)

## 2016-07-21 LAB — PROTIME-INR
INR: 3.2 — AB
PROTHROMBIN TIME: 32.2 s — AB (ref 9.0–11.5)

## 2016-07-24 ENCOUNTER — Other Ambulatory Visit: Payer: Self-pay | Admitting: Endocrinology

## 2016-07-26 ENCOUNTER — Telehealth: Payer: Self-pay | Admitting: Internal Medicine

## 2016-07-26 NOTE — Telephone Encounter (Signed)
New Message:    Pt is scheduled for a Cath tomorrow morning. She wants to know if he should take his blood pressure medicine before he goes to the hospital in the morning?

## 2016-07-26 NOTE — Telephone Encounter (Signed)
Returned call to daughter and left message he can take his Carvedilol the morning of procedure with small sip of water

## 2016-07-27 ENCOUNTER — Ambulatory Visit (HOSPITAL_COMMUNITY)
Admission: RE | Disposition: A | Discharge status: Home / Self Care | Payer: Self-pay | Source: Ambulatory Visit | Attending: Internal Medicine

## 2016-07-27 ENCOUNTER — Encounter (HOSPITAL_COMMUNITY): Payer: Self-pay | Admitting: *Deleted

## 2016-07-27 ENCOUNTER — Ambulatory Visit (HOSPITAL_COMMUNITY)
Admission: RE | Admit: 2016-07-27 | Discharge: 2016-07-27 | Disposition: A | Discharge status: Home / Self Care | Payer: Commercial Managed Care - HMO | Source: Ambulatory Visit | Attending: Internal Medicine | Admitting: Internal Medicine

## 2016-07-27 DIAGNOSIS — Z96652 Presence of left artificial knee joint: Secondary | ICD-10-CM | POA: Diagnosis not present

## 2016-07-27 DIAGNOSIS — E785 Hyperlipidemia, unspecified: Secondary | ICD-10-CM | POA: Diagnosis not present

## 2016-07-27 DIAGNOSIS — E119 Type 2 diabetes mellitus without complications: Secondary | ICD-10-CM | POA: Diagnosis not present

## 2016-07-27 DIAGNOSIS — Z95810 Presence of automatic (implantable) cardiac defibrillator: Secondary | ICD-10-CM | POA: Diagnosis present

## 2016-07-27 DIAGNOSIS — I110 Hypertensive heart disease with heart failure: Secondary | ICD-10-CM | POA: Diagnosis not present

## 2016-07-27 DIAGNOSIS — Z950 Presence of cardiac pacemaker: Secondary | ICD-10-CM | POA: Insufficient documentation

## 2016-07-27 DIAGNOSIS — I5022 Chronic systolic (congestive) heart failure: Secondary | ICD-10-CM | POA: Insufficient documentation

## 2016-07-27 DIAGNOSIS — I442 Atrioventricular block, complete: Secondary | ICD-10-CM

## 2016-07-27 DIAGNOSIS — I482 Chronic atrial fibrillation: Secondary | ICD-10-CM | POA: Insufficient documentation

## 2016-07-27 HISTORY — PX: EP IMPLANTABLE DEVICE: SHX172B

## 2016-07-27 LAB — GLUCOSE, CAPILLARY: Glucose-Capillary: 150 mg/dL — ABNORMAL HIGH (ref 65–99)

## 2016-07-27 LAB — PROTIME-INR
INR: 1.64
PROTHROMBIN TIME: 19.6 s — AB (ref 11.4–15.2)

## 2016-07-27 LAB — SURGICAL PCR SCREEN
MRSA, PCR: NEGATIVE
STAPHYLOCOCCUS AUREUS: NEGATIVE

## 2016-07-27 SURGERY — BIV PACEMAKER REVISION

## 2016-07-27 MED ORDER — FUROSEMIDE 10 MG/ML IJ SOLN
INTRAMUSCULAR | Status: DC | PRN
  Administered 2016-07-27: 40 mg via INTRAVENOUS

## 2016-07-27 MED ORDER — MIDAZOLAM HCL 5 MG/5ML IJ SOLN
INTRAMUSCULAR | Status: DC | PRN
  Administered 2016-07-27: 1 mg via INTRAVENOUS

## 2016-07-27 MED ORDER — SODIUM CHLORIDE 0.9 % IR SOLN: 80.0000 mg | Status: DC

## 2016-07-27 MED ORDER — SODIUM CHLORIDE 0.9 % IV SOLN: INTRAVENOUS | Status: DC

## 2016-07-27 MED ORDER — CEFAZOLIN SODIUM-DEXTROSE 2-4 GM/100ML-% IV SOLN: 2.0000 g | INTRAVENOUS | Status: DC

## 2016-07-27 MED ORDER — FENTANYL CITRATE (PF) 100 MCG/2ML IJ SOLN
INTRAMUSCULAR | Status: AC
  Filled 2016-07-27: qty 2

## 2016-07-27 MED ORDER — FENTANYL CITRATE (PF) 100 MCG/2ML IJ SOLN
INTRAMUSCULAR | Status: DC | PRN
  Administered 2016-07-27: 12.5 ug via INTRAVENOUS

## 2016-07-27 MED ORDER — CEFAZOLIN SODIUM-DEXTROSE 2-4 GM/100ML-% IV SOLN
INTRAVENOUS | Status: AC
  Filled 2016-07-27: qty 100

## 2016-07-27 MED ORDER — CEFAZOLIN SODIUM-DEXTROSE 2-3 GM-% IV SOLR
INTRAVENOUS | Status: DC | PRN
  Administered 2016-07-27: 2 g via INTRAVENOUS

## 2016-07-27 MED ORDER — FUROSEMIDE 10 MG/ML IJ SOLN
INTRAMUSCULAR | Status: AC
  Filled 2016-07-27: qty 4

## 2016-07-27 MED ORDER — MUPIROCIN 2 % EX OINT
1.0000 "application " | TOPICAL_OINTMENT | Freq: Once | CUTANEOUS | Status: AC
  Administered 2016-07-27: 1 via TOPICAL

## 2016-07-27 MED ORDER — MIDAZOLAM HCL 5 MG/5ML IJ SOLN
INTRAMUSCULAR | Status: AC
  Filled 2016-07-27: qty 5

## 2016-07-27 MED ORDER — HEPARIN (PORCINE) IN NACL 2-0.9 UNIT/ML-% IJ SOLN: INTRAMUSCULAR | Status: DC | PRN

## 2016-07-27 MED ORDER — GENTAMICIN SULFATE 40 MG/ML IJ SOLN
INTRAMUSCULAR | Status: AC
  Filled 2016-07-27: qty 2

## 2016-07-27 MED ORDER — LIDOCAINE HCL (PF) 1 % IJ SOLN
INTRAMUSCULAR | Status: DC | PRN
  Administered 2016-07-27: 30 mL via SUBCUTANEOUS

## 2016-07-27 MED ORDER — ACETAMINOPHEN 325 MG PO TABS: 325.0000 mg | ORAL_TABLET | ORAL | Status: DC | PRN

## 2016-07-27 MED ORDER — CHLORHEXIDINE GLUCONATE 4 % EX LIQD
60.0000 mL | Freq: Once | CUTANEOUS | Status: DC
  Filled 2016-07-27: qty 60

## 2016-07-27 MED ORDER — MUPIROCIN 2 % EX OINT
TOPICAL_OINTMENT | CUTANEOUS | Status: AC
  Administered 2016-07-27: 1 via TOPICAL
  Filled 2016-07-27: qty 22

## 2016-07-27 MED ORDER — LIDOCAINE HCL (PF) 1 % IJ SOLN
INTRAMUSCULAR | Status: AC
  Filled 2016-07-27: qty 60

## 2016-07-27 MED ORDER — ONDANSETRON HCL 4 MG/2ML IJ SOLN: 4.0000 mg | Freq: Four times a day (QID) | INTRAMUSCULAR | Status: DC | PRN

## 2016-07-27 SURGICAL SUPPLY — 4 items
CABLE SURGICAL S-101-97-12 (CABLE) ×2 IMPLANT
PAD DEFIB LIFELINK (PAD) ×2 IMPLANT
PPM CONSULTA CRT-P C4TR01 (Pacemaker) ×2 IMPLANT
TRAY PACEMAKER INSERTION (PACKS) ×2 IMPLANT

## 2016-07-27 NOTE — Discharge Instructions (Signed)

## 2016-07-27 NOTE — H&P (Signed)
HPI Mr. Mayford KnifeWilliams returns today for followup. He is an 82 year old man with a nonischemic cardiomyopathy, chronic systolic heart failure, paroxysmal atrial fibrillation, status post biventricular ICD implantation. His heart failure symptoms have improved from class III to class II. His peripheral edema is much better. He denies syncope or ICD shock. No fever or chills. He has occasional fullness in his chest. He denies chest pain.  Since his last visit, he has been stable.  He denies chest pain or sob. No residual pain from his knee surgery. He is walking much better.  No Known Allergies         Current Outpatient Prescriptions  Medication Sig Dispense Refill  . allopurinol (ZYLOPRIM) 100 MG tablet Take 100 mg by mouth daily.    [name]. atorvastatin (LIPITOR) 20 MG tablet Take 20 mg by mouth daily.    . carvedilol (COREG) 12.5 MG tablet Take 1 tablet (12.5 mg total) by mouth 2 (two) times daily with a meal. 180 tablet 2  . lisinopril (PRINIVIL,ZESTRIL) 40 MG tablet Take 40 mg by mouth daily.    . metolazone (ZAROXOLYN) 2.5 MG tablet Take 1 tablet (2.5 mg total) by mouth daily. 90 tablet 2  . polyethylene glycol (MIRALAX / GLYCOLAX) packet Take 17 g by mouth 2 (two) times daily. 14 each 0  . spironolactone (ALDACTONE) 25 MG tablet TAKE 1 TABLET(25 MG) BY MOUTH DAILY 90 tablet 2  . tamsulosin (FLOMAX) 0.4 MG CAPS capsule Take 1 capsule (0.4 mg total) by mouth 2 (two) times daily. 180 capsule 2  . torsemide (DEMADEX) 10 MG tablet Take 1 tablet by mouth every other day.    . warfarin (COUMADIN) 5 MG tablet TAKE 1 TABLET BY MOUTH DAILY AS DIRECTED BY COUMADIN CLINIC 90 tablet 1   No current facility-administered medications for this visit.          Past Medical History  Diagnosis Date  . CHF (congestive heart failure) (HCC)   . HTN (hypertension)   . OA (osteoarthritis)   . BPH (benign prostatic hyperplasia)   . Obesity   . Anemia   . Diabetes mellitus type II   . Atrial  fibrillation (HCC)   . Cardiomyopathy   . Hyperlipidemia   . Peptic ulcer   . Bradycardia   . History of colonoscopy   . ICD (implantable cardiac defibrillator) in place 10/14/2012    biventricular  . GERD (gastroesophageal reflux disease)     ROS:   All systems reviewed and negative except as noted in the HPI.         Past Surgical History  Procedure Laterality Date  . L-spine  1965  . Total knee arthroplasty  1997    right  . Lumbar l4-5 & s1  02/2000  . Esophagogastroduodenoscopy  06/24/2002  . Ep implantable device Left   . Total knee arthroplasty Left 02/15/2015    Procedure: LEFT TOTAL KNEE ARTHROPLASTY;  Surgeon: Durene RomansMatthew Olin, MD;  Location: [facility];  Service: Orthopedics;  Laterality: Left;          Family History  Problem Relation Age of Onset  . Cancer Neg Hx   . Heart disease Father   . Heart disease Mother   . Diabetes Mother   . Hypertension Brother   . Hypertension Brother      Social History        Social History  . Marital Status: Widowed    Spouse Name: N/A  . Number of Children: N/A  . Years of  Education: N/A       Occupational History  . Retired        Social History Main Topics  . Smoking status: Never Smoker   . Smokeless tobacco: Never Used  . Alcohol Use: No  . Drug Use: No  . Sexual Activity: Not on file       Other Topics Concern  . Not on file   Social History Narrative     BP 100/62 mmHg  Pulse 88  Ht 5\' 9"  (1.753 m)  Wt 259 lb 1.9 oz (117.536 kg)  BMI 38.25 kg/m2  Physical Exam:  Well appearing 82 year old man,NAD HEENT: Unremarkable Neck:  7 cm JVD, no thyromegally Lungs:  Clear with no wheezes, rales, or rhonchi. Well-healed ICD incision. HEART:  Regular rate rhythm, no murmurs, no rubs, no clicks Abd:  soft, positive bowel sounds, no organomegally, no rebound, no guarding Ext:  2 plus pulses, no edema, no cyanosis, no clubbing Skin:  No rashes no  nodules Neuro:  CN II through XII intact, motor grossly intact  DEVICE  Normal device function.  See PaceArt for details.   Assess/Plan: 1. Chronic systolic heart failure - his symptoms remain class 2. He will continue his current meds. 2. Atrial fib - he is now chronically in atrial fib. Will follow. His rate is controlled. 3. ICD - his device is now at [facility]. Will schedule gen change. Will obtain a 2D echo to see if he needs an ICD or BiV PPM  Jaymz Traywick,M.D.  EP Attending  Since his last clinic visit, no change. He has an EF of 35-40% by echo a month ago. With no ICD shocks, his advanced age, and improved EF, will plan to downgrade from a BiV ICD to a Biv PM.   Leonia Reeves.D.

## 2016-07-30 ENCOUNTER — Telehealth: Payer: Self-pay | Admitting: Internal Medicine

## 2016-07-30 NOTE — Telephone Encounter (Signed)
Called pt daughter back. She stated that there is "no active bleeding, the drainage on the steri-strips is from the hospital". Informed pts daughter to leave the steri-strips as is and not to remove or try to clean them. Pts daughter voiced understanding.

## 2016-07-30 NOTE — Telephone Encounter (Signed)
New Message  Pts daugther voiced the strips look dirty and wants to know what to do on cleaning the area.  Please follow up with pt. Thanks!

## 2016-07-31 MED FILL — Sodium Chloride Irrigation Soln 0.9%: Qty: 500 | Status: AC

## 2016-07-31 MED FILL — Gentamicin Sulfate Inj 40 MG/ML: INTRAMUSCULAR | Qty: 2 | Status: AC

## 2016-08-06 ENCOUNTER — Encounter: Payer: Self-pay | Admitting: Internal Medicine

## 2016-08-06 ENCOUNTER — Ambulatory Visit (INDEPENDENT_AMBULATORY_CARE_PROVIDER_SITE_OTHER): Payer: Commercial Managed Care - HMO | Admitting: *Deleted

## 2016-08-06 DIAGNOSIS — I5022 Chronic systolic (congestive) heart failure: Secondary | ICD-10-CM

## 2016-08-06 DIAGNOSIS — I4891 Unspecified atrial fibrillation: Secondary | ICD-10-CM | POA: Diagnosis not present

## 2016-08-06 DIAGNOSIS — Z5181 Encounter for therapeutic drug level monitoring: Secondary | ICD-10-CM | POA: Diagnosis not present

## 2016-08-06 DIAGNOSIS — Z7901 Long term (current) use of anticoagulants: Secondary | ICD-10-CM

## 2016-08-06 LAB — CUP PACEART INCLINIC DEVICE CHECK
Battery Voltage: 3.05 V
Brady Statistic AP VS Percent: 0 %
Brady Statistic AS VP Percent: 0 %
Brady Statistic RA Percent Paced: 0 %
Date Time Interrogation Session: 20170828162635
Implantable Lead Implant Date: 20131105
Implantable Lead Location: 753859
Implantable Lead Model: 5076
Implantable Lead Model: 6935
Lead Channel Impedance Value: 380 Ohm
Lead Channel Pacing Threshold Amplitude: 0.75 V
Lead Channel Pacing Threshold Pulse Width: 0.4 ms
Lead Channel Pacing Threshold Pulse Width: 1.5 ms
Lead Channel Setting Pacing Amplitude: 2 V
Lead Channel Setting Pacing Amplitude: 2.5 V
Lead Channel Setting Pacing Pulse Width: 0.4 ms
MDC IDC LEAD IMPLANT DT: 20131105
MDC IDC LEAD IMPLANT DT: 20131105
MDC IDC LEAD LOCATION: 753858
MDC IDC LEAD LOCATION: 753860
MDC IDC LEAD MODEL: 4296
MDC IDC MSMT LEADCHNL RV IMPEDANCE VALUE: 361 Ohm
MDC IDC MSMT LEADCHNL RV PACING THRESHOLD AMPLITUDE: 0.75 V
MDC IDC MSMT LEADCHNL RV SENSING INTR AMPL: 4.875 mV
MDC IDC SET LEADCHNL LV PACING PULSEWIDTH: 1.5 ms
MDC IDC SET LEADCHNL RV SENSING SENSITIVITY: 0.9 mV
MDC IDC STAT BRADY AP VP PERCENT: 0 %
MDC IDC STAT BRADY AS VS PERCENT: 0 %
MDC IDC STAT BRADY RV PERCENT PACED: 0 %

## 2016-08-06 LAB — POCT INR: INR: 2.9

## 2016-08-06 NOTE — Progress Notes (Signed)
Wound check appointment s/p CRTP gen change 07/27/16. Steri-strips removed. Wound without redness or edema. Incision edges approximated, wound well healed. Normal device function. Thresholds, sensing, and impedances consistent with implant measurements. Histogram distribution appropriate for patient and level of activity. No mode switches or high ventricular rates noted. Device did not populate pacing percentages/diagnostic data due to Implant Detection programmed "On." Implant detection programmed "Off/Complete." Patient educated about wound care and Carelink monitoring. ROV with GT 10/24/16.

## 2016-08-17 ENCOUNTER — Other Ambulatory Visit: Payer: Self-pay

## 2016-08-17 ENCOUNTER — Other Ambulatory Visit: Payer: Self-pay | Admitting: Endocrinology

## 2016-08-17 ENCOUNTER — Telehealth: Payer: Self-pay | Admitting: Endocrinology

## 2016-08-17 MED ORDER — FUROSEMIDE 80 MG PO TABS: 80.0000 mg | ORAL_TABLET | Freq: Every day | ORAL | 11 refills | Status: DC

## 2016-08-17 NOTE — Telephone Encounter (Signed)
I contacted the patient's daughter and advised of message. Requested a call back if the patient's daughter would like to discuss,

## 2016-08-17 NOTE — Telephone Encounter (Signed)
I have sent a prescription to your pharmacy Needs ov next week

## 2016-08-17 NOTE — Telephone Encounter (Signed)
See message and please advise, Thanks!  

## 2016-08-17 NOTE — Telephone Encounter (Signed)
It is possible If symptoms are mild, ov next week. If moderate or worse, go to urgent care now.

## 2016-08-17 NOTE — Telephone Encounter (Signed)
Pts legs are weak and BP is low could this be because the potassium was stopped

## 2016-08-17 NOTE — Telephone Encounter (Signed)
PT daughter called and said that Dr. Ladona Ridgel put a pacemaker on patient, she stated that since then she has noticed a lot of swelling in the patient.  She wanted to know if he can be put back on his Lasix to reduce the fluid swelling.

## 2016-08-17 NOTE — Telephone Encounter (Signed)
I contacted the patient's daughter and advised of message via voicemail.  

## 2016-08-20 ENCOUNTER — Encounter: Payer: Self-pay | Admitting: Endocrinology

## 2016-08-20 ENCOUNTER — Ambulatory Visit (INDEPENDENT_AMBULATORY_CARE_PROVIDER_SITE_OTHER): Payer: Commercial Managed Care - HMO | Admitting: Endocrinology

## 2016-08-20 VITALS — BP 118/84 | HR 90 | Ht 69.0 in | Wt 272.0 lb

## 2016-08-20 DIAGNOSIS — E1151 Type 2 diabetes mellitus with diabetic peripheral angiopathy without gangrene: Secondary | ICD-10-CM

## 2016-08-20 DIAGNOSIS — E876 Hypokalemia: Secondary | ICD-10-CM | POA: Diagnosis not present

## 2016-08-20 LAB — BASIC METABOLIC PANEL
BUN: 35 mg/dL — ABNORMAL HIGH (ref 6–23)
CALCIUM: 9.3 mg/dL (ref 8.4–10.5)
CO2: 23 mEq/L (ref 19–32)
Chloride: 106 mEq/L (ref 96–112)
Creatinine, Ser: 1.21 mg/dL (ref 0.40–1.50)
GFR: 60.5 mL/min (ref >60.00)
GLUCOSE: 128 mg/dL — AB (ref 70–99)
POTASSIUM: 5.8 meq/L — AB (ref 3.5–5.1)
SODIUM: 134 meq/L — AB (ref 135–145)

## 2016-08-20 LAB — MAGNESIUM: MAGNESIUM: 1.3 mg/dL — AB (ref 1.5–2.5)

## 2016-08-20 LAB — POCT GLYCOSYLATED HEMOGLOBIN (HGB A1C): HEMOGLOBIN A1C: 6.8

## 2016-08-20 MED ORDER — FUROSEMIDE 80 MG PO TABS: 80.0000 mg | ORAL_TABLET | Freq: Two times a day (BID) | ORAL | 11 refills | Status: DC

## 2016-08-20 NOTE — Patient Instructions (Addendum)
blood tests are requested for you today.  We'll let you know about the results.  Please increase lasix to twice a day.  I have sent a prescription to your pharmacy.  Please come back for a follow-up appointment next week.   You don't need any medication for diabetes now, but you probably will in the future.

## 2016-08-20 NOTE — Progress Notes (Signed)
Subjective:    Patient ID: [name], male    DOB: [birth year], 82 y.o.   MRN: [id number]  HPI The state of at least three ongoing medical problems is addressed today, with interval history of each noted here:  Pt returns for f/u of diabetes mellitus:  DM type: 2.  Dx'ed: 2008.  Complications: nephropathy.   Therapy: none.  DKA: never.  Severe hypoglycemia: never.   Pancreatitis: never.   Interval history: He has gained weight.   Hypokalemia: he denies cramps.  He has been off KLOR.   CHF: he had been off lasix.  dtr called last week.  lasix was resumed.  He still has slight doe.  Past Medical History:  Diagnosis Date  . Anemia   . Atrial fibrillation (HCC)   . BPH (benign prostatic hyperplasia)   . Bradycardia   . Cardiomyopathy   . CHF (congestive heart failure) (HCC)   . Diabetes mellitus type II   . GERD (gastroesophageal reflux disease)   . History of colonoscopy   . HTN (hypertension)   . Hyperlipidemia   . ICD (implantable cardiac defibrillator) in place 10/14/2012   biventricular  . OA (osteoarthritis)   . Obesity   . Peptic ulcer     Past Surgical History:  Procedure Laterality Date  . EP IMPLANTABLE DEVICE Left   . EP IMPLANTABLE DEVICE N/A 07/27/2016   Procedure: BIV Pacemaker downgrade;  Surgeon: Marinus Maw, MD;  Location: [facility] INVASIVE CV LAB;  Service: Cardiovascular;  Laterality: N/A;  . ESOPHAGOGASTRODUODENOSCOPY  06/24/2002  . L-spine  1965  . Lumbar L4-5 & S1  02/2000  . TOTAL KNEE ARTHROPLASTY  1997   right  . TOTAL KNEE ARTHROPLASTY Left 02/15/2015   Procedure: LEFT TOTAL KNEE ARTHROPLASTY;  Surgeon: Durene Romans, MD;  Location: [facility];  Service: Orthopedics;  Laterality: Left;    Social History   Social History  . Marital status: Widowed    Spouse name: N/A  . Number of children: N/A  . Years of education: N/A   Occupational History  . Retired    Social History Main Topics  . Smoking status: Never Smoker  . Smokeless tobacco:  Never Used  . Alcohol use No  . Drug use: No  . Sexual activity: Not on file   Other Topics Concern  . Not on file   Social History Narrative  . No narrative on file    Current Outpatient Prescriptions on File Prior to Visit  Medication Sig Dispense Refill  . allopurinol (ZYLOPRIM) 100 MG tablet Take 100 mg by mouth daily.    [name] atorvastatin (LIPITOR) 20 MG tablet Take 20 mg by mouth daily at 6 PM.     . carvedilol (COREG) 12.5 MG tablet Take 1 tablet (12.5 mg total) by mouth 2 (two) times daily with a meal. 180 tablet 2  . cholecalciferol (VITAMIN D) 1000 units tablet Take 1,000 Units by mouth daily.    [name] lisinopril (PRINIVIL,ZESTRIL) 40 MG tablet Take 40 mg by mouth daily.    . metolazone (ZAROXOLYN) 2.5 MG tablet Take 1 tablet (2.5 mg total) by mouth daily. 90 tablet 2  . polyethylene glycol (MIRALAX / GLYCOLAX) packet Take 17 g by mouth 2 (two) times daily. (Patient taking differently: Take 17 g by mouth daily as needed (constipation). ) 14 each 0  . spironolactone (ALDACTONE) 25 MG tablet TAKE 1 TABLET EVERY DAY 90 tablet 2  . tamsulosin (FLOMAX) 0.4 MG CAPS capsule Take 1 capsule (  0.4 mg total) by mouth 2 (two) times daily. 180 capsule 2  . warfarin (COUMADIN) 5 MG tablet TAKE 1 TABLET BY MOUTH DAILY AS DIRECTED BY COUMADIN CLINIC (Patient taking differently: TAKE 1 TABLET BY MOUTH DAILY AS DIRECTED BY COUMADIN CLINIC. Takes 5mg  daily except 2.5mg  Thursdays.) 90 tablet 1   No current facility-administered medications on file prior to visit.     No Known Allergies  Family History  Problem Relation Age of Onset  . Heart disease Father   . Heart disease Mother   . Diabetes Mother   . Hypertension Brother   . Hypertension Brother   . Cancer Neg Hx     BP 118/84   Pulse 90   Ht 5\' 9"  (1.753 m)   Wt 272 lb (123.4 kg)   BMI 40.17 kg/m    Review of Systems Edema is worse.     Objective:   Physical Exam  VITAL SIGNS:  See vs page GENERAL: no distress.   Pulses:  dorsalis pedis intact bilat.  Feet: no deformity.  2+ bilat leg edema. bilat vv's. There is bilateral onychomycosis.   Skin: bilat rust discoloration of the legs. Heavy callus is again noted on the plantar aspect of the right foot, under the MTP joint. no ulcer on the feet. feet are of normal temp, but are cyanotic.  Neuro: sensation is intact to touch on the feet.   A1c=6.8% Lab Results  Component Value Date   CREATININE 1.21 08/20/2016   BUN 35 (H) 08/20/2016   NA 134 (L) 08/20/2016   K 5.8 (H) 08/20/2016   CL 106 08/20/2016   CO2 23 08/20/2016       Assessment & Plan:  Type 2 DM: no medication needed now. Hyperkalemia: new.  I advised pt to recheck in a few days CHF: he needs increased rx.

## 2016-08-28 ENCOUNTER — Ambulatory Visit (INDEPENDENT_AMBULATORY_CARE_PROVIDER_SITE_OTHER): Payer: Commercial Managed Care - HMO | Admitting: Endocrinology

## 2016-08-28 ENCOUNTER — Ambulatory Visit
Admission: RE | Admit: 2016-08-28 | Discharge: 2016-08-28 | Disposition: A | Discharge status: Home / Self Care | Payer: Commercial Managed Care - HMO | Source: Ambulatory Visit | Attending: Endocrinology | Admitting: Endocrinology

## 2016-08-28 ENCOUNTER — Encounter: Payer: Self-pay | Admitting: Endocrinology

## 2016-08-28 VITALS — BP 128/88 | HR 71 | Ht 69.0 in | Wt 259.0 lb

## 2016-08-28 DIAGNOSIS — I5022 Chronic systolic (congestive) heart failure: Secondary | ICD-10-CM

## 2016-08-28 DIAGNOSIS — Z23 Encounter for immunization: Secondary | ICD-10-CM

## 2016-08-28 DIAGNOSIS — I509 Heart failure, unspecified: Secondary | ICD-10-CM | POA: Diagnosis not present

## 2016-08-28 LAB — BASIC METABOLIC PANEL
BUN: 67 mg/dL — AB (ref 6–23)
CALCIUM: 9.1 mg/dL (ref 8.4–10.5)
CHLORIDE: 99 meq/L (ref 96–112)
CO2: 28 mEq/L (ref 19–32)
CREATININE: 1.36 mg/dL (ref 0.40–1.50)
GFR: 52.87 mL/min — ABNORMAL LOW (ref >60.00)
Glucose, Bld: 132 mg/dL — ABNORMAL HIGH (ref 70–99)
Potassium: 4.4 mEq/L (ref 3.5–5.1)
Sodium: 134 mEq/L — ABNORMAL LOW (ref 135–145)

## 2016-08-28 LAB — BRAIN NATRIURETIC PEPTIDE: PRO B NATRI PEPTIDE: 167 pg/mL — AB (ref 0.0–100.0)

## 2016-08-28 MED ORDER — CEFUROXIME AXETIL 250 MG PO TABS: 250.0000 mg | ORAL_TABLET | Freq: Two times a day (BID) | ORAL | 0 refills | Status: AC

## 2016-08-28 MED ORDER — TRAMADOL HCL 50 MG PO TABS: 50.0000 mg | ORAL_TABLET | Freq: Four times a day (QID) | ORAL | 1 refills | Status: DC | PRN

## 2016-08-28 NOTE — Patient Instructions (Addendum)
blood tests and a chest x-ray, are requested for you today.  We'll let you know about the results.  Here are 2 prescriptions: cough and antibiotic. Loratadine-d (non-prescription) will help your congestion. I hope you feel better soon.  If you don't feel better by next week, please call back.  Please call sooner if you get worse.  Please come back for a follow-up appointment in 1 month.

## 2016-08-28 NOTE — Progress Notes (Signed)
Subjective:    Patient ID: [name], male    DOB: 05/14/1929, 82 y.o.   MRN: [id number]  HPI Pt states few days of slight pain at the throat, and assoc nasal congestion.  Hypokalemia: he denies cramps.  He has been off KLOR.   CHF: lasix was recently resumed.  He still has slight doe.  Past Medical History:  Diagnosis Date  . Anemia   . Atrial fibrillation (HCC)   . BPH (benign prostatic hyperplasia)   . Bradycardia   . Cardiomyopathy   . CHF (congestive heart failure) (HCC)   . Diabetes mellitus type II   . GERD (gastroesophageal reflux disease)   . History of colonoscopy   . HTN (hypertension)   . Hyperlipidemia   . ICD (implantable cardiac defibrillator) in place 10/14/2012   biventricular  . OA (osteoarthritis)   . Obesity   . Peptic ulcer     Past Surgical History:  Procedure Laterality Date  . EP IMPLANTABLE DEVICE Left   . EP IMPLANTABLE DEVICE N/A 07/27/2016   Procedure: BIV Pacemaker downgrade;  Surgeon: Marinus MawGregg W Taylor, MD;  [facility];  Service: Cardiovascular;  Laterality: N/A;  . ESOPHAGOGASTRODUODENOSCOPY  06/24/2002  . L-spine  1965  . Lumbar L4-5 & S1  02/2000  . TOTAL KNEE ARTHROPLASTY  1997   right  . TOTAL KNEE ARTHROPLASTY Left 02/15/2015   Procedure: LEFT TOTAL KNEE ARTHROPLASTY;  Surgeon: Durene RomansMatthew Olin, MD;  Location: [facility];  Service: Orthopedics;  Laterality: Left;    Social History   Social History  . Marital status: Widowed    Spouse name: N/A  . Number of children: N/A  . Years of education: N/A   Occupational History  . Retired    Social History Main Topics  . Smoking status: Never Smoker  . Smokeless tobacco: Never Used  . Alcohol use No  . Drug use: No  . Sexual activity: Not on file   Other Topics Concern  . Not on file   Social History Narrative  . No narrative on file    Current Outpatient Prescriptions on File Prior to Visit  Medication Sig Dispense Refill  . allopurinol (ZYLOPRIM) 100 MG  tablet Take 100 mg by mouth daily.    [name]. atorvastatin (LIPITOR) 20 MG tablet Take 20 mg by mouth daily at 6 PM.     . carvedilol (COREG) 12.5 MG tablet Take 1 tablet (12.5 mg total) by mouth 2 (two) times daily with a meal. 180 tablet 2  . cholecalciferol (VITAMIN D) 1000 units tablet Take 1,000 Units by mouth daily.    . furosemide (LASIX) 80 MG tablet Take 1 tablet (80 mg total) by mouth 2 (two) times daily. 60 tablet 11  . lisinopril (PRINIVIL,ZESTRIL) 40 MG tablet Take 40 mg by mouth daily.    . metolazone (ZAROXOLYN) 2.5 MG tablet Take 1 tablet (2.5 mg total) by mouth daily. 90 tablet 2  . polyethylene glycol (MIRALAX / GLYCOLAX) packet Take 17 g by mouth 2 (two) times daily. (Patient taking differently: Take 17 g by mouth daily as needed (constipation). ) 14 each 0  . spironolactone (ALDACTONE) 25 MG tablet TAKE 1 TABLET EVERY DAY 90 tablet 2  . tamsulosin (FLOMAX) 0.4 MG CAPS capsule Take 1 capsule (0.4 mg total) by mouth 2 (two) times daily. 180 capsule 2  . warfarin (COUMADIN) 5 MG tablet TAKE 1 TABLET BY MOUTH DAILY AS DIRECTED BY COUMADIN CLINIC (Patient taking differently: TAKE 1  TABLET BY MOUTH DAILY AS DIRECTED BY COUMADIN CLINIC. Takes 5mg  daily except 2.5mg  Thursdays.) 90 tablet 1   No current facility-administered medications on file prior to visit.     No Known Allergies  Family History  Problem Relation Age of Onset  . Heart disease Father   . Heart disease Mother   . Diabetes Mother   . Hypertension Brother   . Hypertension Brother   . Cancer Neg Hx     BP 128/88   Pulse 71   Ht 5\' 9"  (1.753 m)   Wt 259 lb (117.5 kg)   SpO2 93%   BMI 38.25 kg/m   Review of Systems Denies fever.  He has a dry cough.     Objective:   Physical Exam VITAL SIGNS:  See vs page GENERAL: no distress head: no deformity  eyes: no periorbital swelling, no proptosis  external nose and ears are normal  mouth: no lesion seen Both eac's and tm's are normal.   LUNGS:  Clear to  auscultation, except for rales at the bases.     Lab Results  Component Value Date   CREATININE 1.36 08/28/2016   BUN 67 (H) 08/28/2016   NA 134 (L) 08/28/2016   K 4.4 08/28/2016   CL 99 08/28/2016   CO2 28 08/28/2016       Assessment & Plan:  URI: new CHF: clinically improved Hypokalemia: no rx needed now. Azotemia: worse.

## 2016-08-29 ENCOUNTER — Telehealth: Payer: Self-pay | Admitting: Endocrinology

## 2016-08-29 MED ORDER — FUROSEMIDE 80 MG PO TABS: 80.0000 mg | ORAL_TABLET | Freq: Every day | ORAL | 11 refills | Status: DC

## 2016-08-29 NOTE — Telephone Encounter (Signed)
please call patient: As your kidneys are worse, please reduce the furosemide to once a day.  I'll see you next time. Also, Please see a heart specialist (in addition to seeing Dr Ladona Ridgel).  you will receive a phone call, about a day and time for an appointment

## 2016-08-30 NOTE — Telephone Encounter (Signed)
I contacted the patient's daughter and advised of note. She voiced understanding and had no further questions.

## 2016-08-31 ENCOUNTER — Telehealth: Payer: Self-pay | Admitting: Cardiology

## 2016-08-31 NOTE — Telephone Encounter (Signed)
Left message for pt dtr to call  

## 2016-08-31 NOTE — Telephone Encounter (Signed)
Returned call to patient's daughter [name].She stated father recently saw PCP Dr.Ellison.Stated Dr.Ellison advised he needs to see heart failure Dr.Stated he has CHF,labs revealed kidneys are worse.Stated he is sob recent cold with wheezing was prescribed antibiotics.Stated she would like him to see Dr.Crenshaw and get his advice about going to heart failure Dr.Appointment scheduled with Dr.Crenshaw 09/21/16 at 4:15 pm.Stated she would like sooner appointment.Advised I will send message to Dr.Crenshaw's nurse Debra to schedule sooner appointment and to ask Dr.Crenshaw about going to heart failure clinic,wants Debra to call her back at [phone].

## 2016-08-31 NOTE — Telephone Encounter (Signed)
Left message for debbie, I will forward for dr Jens Som to review dr ellison's records. Will be back in touch with her the first of the week.

## 2016-08-31 NOTE — Telephone Encounter (Signed)
New massage   Pt daughter verbalized that she needs to speak to rn about pt kidney failure

## 2016-09-01 NOTE — Telephone Encounter (Signed)
Schedule sooner fuov [name]

## 2016-09-05 NOTE — Telephone Encounter (Signed)
Left message for dtr, appointment moved to 09-12-16 @ 9:15 am.

## 2016-09-07 NOTE — Progress Notes (Signed)
HPI: FU nonischemic cardiomyopathy as well as atrial fibrillation. Note, he had a catheterization in January 2006 that showed no coronary artery disease and an ejection fraction of 40%. He had a Myoview performed last on May 04, 2008, that showed an ejection fraction of 31%. There was felt to be a prior inferior infarct with mild peri-infarct ischemia. I did review this and felt there was a low risk and we have been treating medically. He does have permanent atrial fibrillation as well. Holter monitor in August of 2011 showed mildly reduced rate and we decreased his Coreg. In November of 2013 the patient had a biventricular ICD placed; downgraded to BIV pacemaker 8/17. Echo 7/17 showed EF 35-40, mild MR; mild LAE; mild RVE with reduced function; mild RAE. Seen recently by primary care and felt to have CHF with adjustment of diuretics. Since I last saw him, patient is doing recently well. He had previously gained significant amounts of weight and was short of breath. This is improving. He does have dyspnea on exertion but no orthopnea or PND. He has chronic pedal edema. No chest pain or syncope.  Current Outpatient Prescriptions  Medication Sig Dispense Refill  . allopurinol (ZYLOPRIM) 100 MG tablet Take 100 mg by mouth daily.    [name]. atorvastatin (LIPITOR) 20 MG tablet Take 20 mg by mouth daily at 6 PM.     . carvedilol (COREG) 12.5 MG tablet Take 1 tablet (12.5 mg total) by mouth 2 (two) times daily with a meal. 180 tablet 2  . cholecalciferol (VITAMIN D) 1000 units tablet Take 1,000 Units by mouth daily.    . furosemide (LASIX) 80 MG tablet Take 1 tablet (80 mg total) by mouth daily. 60 tablet 11  . lisinopril (PRINIVIL,ZESTRIL) 40 MG tablet Take 40 mg by mouth daily.    . metolazone (ZAROXOLYN) 2.5 MG tablet Take 1 tablet (2.5 mg total) by mouth daily. 90 tablet 2  . polyethylene glycol (MIRALAX / GLYCOLAX) packet Take 17 g by mouth 2 (two) times daily. (Patient taking differently: Take 17 g by  mouth daily as needed (constipation). ) 14 each 0  . spironolactone (ALDACTONE) 25 MG tablet TAKE 1 TABLET EVERY DAY 90 tablet 2  . tamsulosin (FLOMAX) 0.4 MG CAPS capsule Take 1 capsule (0.4 mg total) by mouth 2 (two) times daily. 180 capsule 2  . traMADol (ULTRAM) 50 MG tablet Take 1 tablet (50 mg total) by mouth every 6 (six) hours as needed (cough). 30 tablet 1  . warfarin (COUMADIN) 5 MG tablet TAKE 1 TABLET BY MOUTH DAILY AS DIRECTED BY COUMADIN CLINIC (Patient taking differently: TAKE 1 TABLET BY MOUTH DAILY AS DIRECTED BY COUMADIN CLINIC. Takes 5mg  daily except 2.5mg  Thursdays.) 90 tablet 1   No current facility-administered medications for this visit.      Past Medical History:  Diagnosis Date  . Anemia   . Atrial fibrillation (HCC)   . BPH (benign prostatic hyperplasia)   . Bradycardia   . Cardiomyopathy   . CHF (congestive heart failure) (HCC)   . Diabetes mellitus type II   . GERD (gastroesophageal reflux disease)   . History of colonoscopy   . HTN (hypertension)   . Hyperlipidemia   . ICD (implantable cardiac defibrillator) in place 10/14/2012   biventricular  . OA (osteoarthritis)   . Obesity   . Peptic ulcer     Past Surgical History:  Procedure Laterality Date  . EP IMPLANTABLE DEVICE Left   . EP IMPLANTABLE  DEVICE N/A 07/27/2016   Procedure: BIV Pacemaker downgrade;  Surgeon: Marinus Maw, MD;  Location: [facility] INVASIVE CV LAB;  Service: Cardiovascular;  Laterality: N/A;  . ESOPHAGOGASTRODUODENOSCOPY  06/24/2002  . L-spine  1965  . Lumbar L4-5 & S1  02/2000  . TOTAL KNEE ARTHROPLASTY  1997   right  . TOTAL KNEE ARTHROPLASTY Left 02/15/2015   Procedure: LEFT TOTAL KNEE ARTHROPLASTY;  Surgeon: Durene Romans, MD;  Location: [facility];  Service: Orthopedics;  Laterality: Left;    Social History   Social History  . Marital status: Widowed    Spouse name: N/A  . Number of children: N/A  . Years of education: N/A   Occupational History  . Retired    Social  History Main Topics  . Smoking status: Never Smoker  . Smokeless tobacco: Never Used  . Alcohol use No  . Drug use: No  . Sexual activity: Not on file   Other Topics Concern  . Not on file   Social History Narrative  . No narrative on file    Family History  Problem Relation Age of Onset  . Heart disease Father   . Heart disease Mother   . Diabetes Mother   . Hypertension Brother   . Hypertension Brother   . Cancer Neg Hx     ROS: no fevers or chills, productive cough, hemoptysis, dysphasia, odynophagia, melena, hematochezia, dysuria, hematuria, rash, seizure activity, orthopnea, PND, pedal edema, claudication. Remaining systems are negative.  Physical Exam: Well-developed well-nourished in no acute distress.  Skin is warm and dry.  HEENT is normal.  Neck is supple.  Chest is clear to auscultation with normal expansion.  Cardiovascular exam is regular rate and rhythm.  Abdominal exam nontender or distended. No masses palpated. Extremities show no edema. neuro grossly intact  ECG-Ventricular pacing with occasional PVCs.  A/P  1 Hyperlipidemia-continue statin.  2 hypertension-blood pressure is somewhat low. He may be dehydrated. We will continue present medications. Check potassium and renal function. We may need to decrease diuretics. If blood pressure continues to run low we will decrease lisinopril. He is not having any dizziness.  3 cardiomyopathy-continue beta blocker and ACE inhibitor.  4 atrial fibrillation-continue beta blocker for rate control. Continue Coumadin.   5 ICD-followed by electrophysiology.  6 chronic combined systolic/diastolic congestive heart failure-patient recently had increasing dyspnea, fluid retention and weight gain. Lasix was added and his most recent laboratories appear to be prerenal. He does have chronic pedal edema. I will check potassium, renal function and BNP. If he appears to be dehydrated we will adjust his diuretics and follow  closely for recurrent CHF.  Olga Millers, MD     Olga Millers, MD

## 2016-09-11 ENCOUNTER — Encounter: Payer: Self-pay | Admitting: Cardiology

## 2016-09-12 ENCOUNTER — Ambulatory Visit (INDEPENDENT_AMBULATORY_CARE_PROVIDER_SITE_OTHER): Payer: Commercial Managed Care - HMO | Admitting: Cardiology

## 2016-09-12 ENCOUNTER — Telehealth: Payer: Self-pay | Admitting: *Deleted

## 2016-09-12 ENCOUNTER — Encounter: Payer: Self-pay | Admitting: Cardiology

## 2016-09-12 VITALS — BP 90/60 | HR 62 | Ht 68.0 in | Wt 256.4 lb

## 2016-09-12 DIAGNOSIS — Z95810 Presence of automatic (implantable) cardiac defibrillator: Secondary | ICD-10-CM

## 2016-09-12 DIAGNOSIS — E784 Other hyperlipidemia: Secondary | ICD-10-CM | POA: Diagnosis not present

## 2016-09-12 DIAGNOSIS — I509 Heart failure, unspecified: Secondary | ICD-10-CM

## 2016-09-12 DIAGNOSIS — N289 Disorder of kidney and ureter, unspecified: Secondary | ICD-10-CM

## 2016-09-12 DIAGNOSIS — Z4502 Encounter for adjustment and management of automatic implantable cardiac defibrillator: Secondary | ICD-10-CM

## 2016-09-12 DIAGNOSIS — I420 Dilated cardiomyopathy: Secondary | ICD-10-CM

## 2016-09-12 DIAGNOSIS — E7849 Other hyperlipidemia: Secondary | ICD-10-CM

## 2016-09-12 DIAGNOSIS — I5022 Chronic systolic (congestive) heart failure: Secondary | ICD-10-CM

## 2016-09-12 DIAGNOSIS — I10 Essential (primary) hypertension: Secondary | ICD-10-CM

## 2016-09-12 LAB — BASIC METABOLIC PANEL
BUN: 67 mg/dL — ABNORMAL HIGH (ref 7–25)
CALCIUM: 9.3 mg/dL (ref 8.6–10.3)
CO2: 20 mmol/L (ref 20–31)
CREATININE: 1.38 mg/dL — AB (ref 0.70–1.11)
Chloride: 99 mmol/L (ref 98–110)
GLUCOSE: 137 mg/dL — AB (ref 65–99)
Potassium: 4.5 mmol/L (ref 3.5–5.3)
SODIUM: 132 mmol/L — AB (ref 135–146)

## 2016-09-12 LAB — BRAIN NATRIURETIC PEPTIDE: Brain Natriuretic Peptide: 158.6 pg/mL — ABNORMAL HIGH (ref <100)

## 2016-09-12 MED ORDER — FUROSEMIDE 80 MG PO TABS: 40.0000 mg | ORAL_TABLET | Freq: Every day | ORAL | 11 refills | Status: DC

## 2016-09-12 NOTE — Telephone Encounter (Signed)
Spoke with pt dtr, aware of medication change. Lab orders mailed to the pt

## 2016-09-12 NOTE — Telephone Encounter (Signed)
-----   Message from Lewayne Bunting, MD sent at 09/12/2016  3:17 PM EDT ----- Hold lasix for 1 day and then decrease to 40 mg daily; bmet one week Olga Millers

## 2016-09-12 NOTE — Patient Instructions (Signed)
Medication Instructions:   NO CHANGE  Labwork:  Your physician recommends that you HAVE LAB WORK TODAY  Follow-Up:  Your physician recommends that you schedule a follow-up appointment in: 4 WEEKS WITH DR Jens Som

## 2016-09-17 ENCOUNTER — Ambulatory Visit (INDEPENDENT_AMBULATORY_CARE_PROVIDER_SITE_OTHER): Payer: Commercial Managed Care - HMO | Admitting: *Deleted

## 2016-09-17 DIAGNOSIS — Z5181 Encounter for therapeutic drug level monitoring: Secondary | ICD-10-CM | POA: Diagnosis not present

## 2016-09-17 DIAGNOSIS — Z7901 Long term (current) use of anticoagulants: Secondary | ICD-10-CM

## 2016-09-17 DIAGNOSIS — I4891 Unspecified atrial fibrillation: Secondary | ICD-10-CM

## 2016-09-17 LAB — POCT INR: INR: 4.2

## 2016-09-18 ENCOUNTER — Ambulatory Visit: Payer: Commercial Managed Care - HMO | Admitting: Endocrinology

## 2016-09-21 ENCOUNTER — Ambulatory Visit: Payer: Commercial Managed Care - HMO | Admitting: Cardiology

## 2016-09-21 DIAGNOSIS — N289 Disorder of kidney and ureter, unspecified: Secondary | ICD-10-CM | POA: Diagnosis not present

## 2016-09-21 LAB — BASIC METABOLIC PANEL
BUN: 64 mg/dL — AB (ref 7–25)
CHLORIDE: 101 mmol/L (ref 98–110)
CO2: 19 mmol/L — ABNORMAL LOW (ref 20–31)
Calcium: 9.2 mg/dL (ref 8.6–10.3)
Creat: 1.29 mg/dL — ABNORMAL HIGH (ref 0.70–1.11)
Glucose, Bld: 124 mg/dL — ABNORMAL HIGH (ref 65–99)
POTASSIUM: 4.6 mmol/L (ref 3.5–5.3)
Sodium: 136 mmol/L (ref 135–146)

## 2016-09-24 ENCOUNTER — Telehealth: Payer: Self-pay | Admitting: *Deleted

## 2016-09-24 DIAGNOSIS — N289 Disorder of kidney and ureter, unspecified: Secondary | ICD-10-CM

## 2016-09-24 MED ORDER — FUROSEMIDE 80 MG PO TABS: 20.0000 mg | ORAL_TABLET | Freq: Every day | ORAL | 11 refills | Status: DC

## 2016-09-24 NOTE — Telephone Encounter (Signed)
-----   Message from Lewayne Bunting, MD sent at 09/22/2016  6:02 AM EDT ----- Hod lasix for 2 days and then decrease to 20 mg daily, bmet 2 weeks Olga Millers

## 2016-09-24 NOTE — Telephone Encounter (Signed)
Left message for pt dtr to call  

## 2016-09-24 NOTE — Telephone Encounter (Signed)
Spoke with pt dtr, aware of medication change. Lab orders mailed to the pt  

## 2016-09-25 ENCOUNTER — Encounter: Payer: Self-pay | Admitting: Endocrinology

## 2016-09-25 ENCOUNTER — Ambulatory Visit (INDEPENDENT_AMBULATORY_CARE_PROVIDER_SITE_OTHER): Payer: Commercial Managed Care - HMO | Admitting: Endocrinology

## 2016-09-25 VITALS — BP 106/62 | HR 78 | Ht 68.0 in | Wt 256.0 lb

## 2016-09-25 DIAGNOSIS — E1151 Type 2 diabetes mellitus with diabetic peripheral angiopathy without gangrene: Secondary | ICD-10-CM | POA: Diagnosis not present

## 2016-09-25 LAB — LIPID PANEL
CHOL/HDL RATIO: 3
Cholesterol: 149 mg/dL (ref 0–200)
HDL: 57.1 mg/dL (ref >39.00)
LDL Cholesterol: 78 mg/dL (ref 0–99)
NonHDL: 91.66
TRIGLYCERIDES: 67 mg/dL (ref 0.0–149.0)
VLDL: 13.4 mg/dL (ref 0.0–40.0)

## 2016-09-25 LAB — TSH: TSH: 1.44 u[IU]/mL (ref 0.35–4.50)

## 2016-09-25 NOTE — Progress Notes (Signed)
we discussed code status.  pt requests full code, but would not want to be started or maintained on artificial life-support measures if there was not a reasonable chance of recovery 

## 2016-09-25 NOTE — Patient Instructions (Addendum)
Please consider these measures for your health:  minimize alcohol.  Do not use tobacco products.  Have a colonoscopy at least every 10 years from age 80.  Women should have an annual mammogram from age 57.  Keep firearms safely stored.  Always use seat belts.  have working smoke alarms in your home.  See an eye doctor and dentist regularly.  Never drive under the influence of alcohol or drugs (including prescription drugs).  Those with fair skin should take precautions against the sun, and should carefully examine their skin once per month, for any new or changed moles. It is critically important to prevent falling down (keep floor areas well-lit, dry, and free of loose objects.  If you have a cane, walker, or wheelchair, you should use it, even for short trips around the house.  Wear flat-soled shoes.  Also, try not to rush).   blood tests are requested for you today.  We'll let you know about the results.   Please come back for a follow-up appointment in 3 months.

## 2016-09-25 NOTE — Progress Notes (Signed)
Subjective:    Patient ID: [name], male    DOB: 09/08/1929, 82 y.o.   MRN: [id number]  HPI Pt is here for regular wellness examination, and is feeling pretty well in general, and says chronic med probs are stable, except as noted below Past Medical History:  Diagnosis Date  . Anemia   . Atrial fibrillation (HCC)   . BPH (benign prostatic hyperplasia)   . Bradycardia   . Cardiomyopathy   . CHF (congestive heart failure) (HCC)   . Diabetes mellitus type II   . GERD (gastroesophageal reflux disease)   . History of colonoscopy   . HTN (hypertension)   . Hyperlipidemia   . ICD (implantable cardiac defibrillator) in place 10/14/2012   biventricular  . OA (osteoarthritis)   . Obesity   . Peptic ulcer     Past Surgical History:  Procedure Laterality Date  . EP IMPLANTABLE DEVICE Left   . EP IMPLANTABLE DEVICE N/A 07/27/2016   Procedure: BIV Pacemaker downgrade;  Surgeon: Marinus Maw, MD;  Location: [facility] INVASIVE CV LAB;  Service: Cardiovascular;  Laterality: N/A;  . ESOPHAGOGASTRODUODENOSCOPY  06/24/2002  . L-spine  1965  . Lumbar L4-5 & S1  02/2000  . TOTAL KNEE ARTHROPLASTY  1997   right  . TOTAL KNEE ARTHROPLASTY Left 02/15/2015   Procedure: LEFT TOTAL KNEE ARTHROPLASTY;  Surgeon: Durene Romans, MD;  Location: [facility];  Service: Orthopedics;  Laterality: Left;    Social History   Social History  . Marital status: Widowed    Spouse name: N/A  . Number of children: N/A  . Years of education: N/A   Occupational History  . Retired    Social History Main Topics  . Smoking status: Never Smoker  . Smokeless tobacco: Never Used  . Alcohol use No  . Drug use: No  . Sexual activity: Not on file   Other Topics Concern  . Not on file   Social History Narrative  . No narrative on file    Current Outpatient Prescriptions on File Prior to Visit  Medication Sig Dispense Refill  . allopurinol (ZYLOPRIM) 100 MG tablet Take 100 mg by mouth daily.    [name] atorvastatin  (LIPITOR) 20 MG tablet Take 20 mg by mouth daily at 6 PM.     . carvedilol (COREG) 12.5 MG tablet Take 1 tablet (12.5 mg total) by mouth 2 (two) times daily with a meal. 180 tablet 2  . cholecalciferol (VITAMIN D) 1000 units tablet Take 1,000 Units by mouth daily.    . furosemide (LASIX) 80 MG tablet Take 0.5 tablets (40 mg total) by mouth daily. 60 tablet 11  . lisinopril (PRINIVIL,ZESTRIL) 40 MG tablet Take 40 mg by mouth daily.    . metolazone (ZAROXOLYN) 2.5 MG tablet Take 1 tablet (2.5 mg total) by mouth daily. 90 tablet 2  . polyethylene glycol (MIRALAX / GLYCOLAX) packet Take 17 g by mouth 2 (two) times daily. (Patient taking differently: Take 17 g by mouth daily as needed (constipation). ) 14 each 0  . spironolactone (ALDACTONE) 25 MG tablet TAKE 1 TABLET EVERY DAY 90 tablet 2  . tamsulosin (FLOMAX) 0.4 MG CAPS capsule Take 1 capsule (0.4 mg total) by mouth 2 (two) times daily. 180 capsule 2  . warfarin (COUMADIN) 5 MG tablet TAKE 1 TABLET BY MOUTH DAILY AS DIRECTED BY COUMADIN CLINIC (Patient taking differently: TAKE 1 TABLET BY MOUTH DAILY AS DIRECTED BY COUMADIN CLINIC. Takes 5mg  daily except 2.5mg  Thursdays.) 90 tablet  1  . traMADol (ULTRAM) 50 MG tablet Take 1 tablet (50 mg total) by mouth every 6 (six) hours as needed (cough). (Patient not taking: Reported on 09/25/2016) 30 tablet 1   No current facility-administered medications on file prior to visit.     No Known Allergies  Family History  Problem Relation Age of Onset  . Heart disease Father   . Heart disease Mother   . Diabetes Mother   . Hypertension Brother   . Hypertension Brother   . Cancer Neg Hx     BP 106/62   Pulse 78   Ht 5\' 8"  (1.727 m)   Wt 256 lb (116.1 kg)   SpO2 98%   BMI 38.92 kg/m     Review of Systems  Constitutional: Negative for fever.  HENT: Positive for hearing loss.   Eyes: Negative for visual disturbance.  Respiratory: Negative for shortness of breath.   Cardiovascular: Negative for  chest pain.  Gastrointestinal: Negative for anal bleeding.  Endocrine: Negative for cold intolerance.  Genitourinary: Negative for hematuria.  Musculoskeletal: Positive for back pain.  Skin: Negative for rash.  Allergic/Immunologic: Negative for environmental allergies.  Neurological: Negative for numbness.  Hematological: Bruises/bleeds easily.  Psychiatric/Behavioral: Negative for dysphoric mood.      Objective:   Physical Exam VS: see vs page GEN: no distress.  Morbid obesity.   HEAD: head: no deformity eyes: no periorbital swelling, no proptosis external nose and ears are normal mouth: no lesion seen NECK: supple, thyroid is not enlarged.   CHEST WALL: no deformity LUNGS: clear to auscultation CV: reg rate and rhythm, no murmur ABD: abdomen is soft, nontender.  no hepatosplenomegaly.  not distended.  no hernia MUSCULOSKELETAL: muscle bulk and strength are grossly normal.  no obvious joint swelling.  gait is normal and steady PULSES: no carotid bruit NEURO:  cn 2-12 grossly intact.   readily moves all 4's.  SKIN:  Normal texture and temperature.  No rash or suspicious lesion is visible.   NODES:  None palpable at the neck.   PSYCH: alert, well-oriented.  Does not appear anxious nor depressed.       Assessment & Plan:  Wellness visit today, with problems stable, except as noted. Patient is advised the following: Patient Instructions  Please consider these measures for your health:  minimize alcohol.  Do not use tobacco products.  Have a colonoscopy at least every 10 years from age 80.  Women should have an annual mammogram from age 80.  Keep firearms safely stored.  Always use seat belts.  have working smoke alarms in your home.  See an eye doctor and dentist regularly.  Never drive under the influence of alcohol or drugs (including prescription drugs).  Those with fair skin should take precautions against the sun, and should carefully examine their skin once per month, for any  new or changed moles. It is critically important to prevent falling down (keep floor areas well-lit, dry, and free of loose objects.  If you have a cane, walker, or wheelchair, you should use it, even for short trips around the house.  Wear flat-soled shoes.  Also, try not to rush).   blood tests are requested for you today.  We'll let you know about the results.   Please come back for a follow-up appointment in 3 months.

## 2016-10-02 ENCOUNTER — Ambulatory Visit (INDEPENDENT_AMBULATORY_CARE_PROVIDER_SITE_OTHER): Payer: Commercial Managed Care - HMO | Admitting: *Deleted

## 2016-10-02 DIAGNOSIS — I4891 Unspecified atrial fibrillation: Secondary | ICD-10-CM

## 2016-10-02 DIAGNOSIS — Z7901 Long term (current) use of anticoagulants: Secondary | ICD-10-CM | POA: Diagnosis not present

## 2016-10-02 DIAGNOSIS — Z5181 Encounter for therapeutic drug level monitoring: Secondary | ICD-10-CM | POA: Diagnosis not present

## 2016-10-02 DIAGNOSIS — N289 Disorder of kidney and ureter, unspecified: Secondary | ICD-10-CM | POA: Diagnosis not present

## 2016-10-02 LAB — POCT INR: INR: 3.4

## 2016-10-03 LAB — BASIC METABOLIC PANEL
BUN: 42 mg/dL — AB (ref 7–25)
CALCIUM: 8.8 mg/dL (ref 8.6–10.3)
CO2: 22 mmol/L (ref 20–31)
CREATININE: 1.24 mg/dL — AB (ref 0.70–1.11)
Chloride: 103 mmol/L (ref 98–110)
Glucose, Bld: 125 mg/dL — ABNORMAL HIGH (ref 65–99)
Potassium: 4.6 mmol/L (ref 3.5–5.3)
Sodium: 134 mmol/L — ABNORMAL LOW (ref 135–146)

## 2016-10-09 NOTE — Progress Notes (Signed)
HPI: FU nonischemic cardiomyopathy as well as atrial fibrillation. Note, he had a catheterization in January 2006 that showed no coronary artery disease and an ejection fraction of 40%. He had a Myoview performed last on May 04, 2008, that showed an ejection fraction of 31%. There was felt to be a prior inferior infarct with mild peri-infarct ischemia. I did review this and felt there was a low risk and we have been treating medically. He does have permanent atrial fibrillation as well. Holter monitor in August of 2011 showed mildly reduced rate and we decreased his Coreg. In November of 2013 the patient had a biventricular ICD placed; downgraded to BIV pacemaker 8/17. Echo 7/17 showed EF 35-40, mild MR; mild LAE; mild RVE with reduced function; mild RAE. I reduced his diuretic regimen recently. Since I last saw him, he has mild dyspnea on exertion but no orthopnea or PND. He has chronic pedal edema. No chest pain or syncope. He feels much better.  Current Outpatient Prescriptions  Medication Sig Dispense Refill  . allopurinol (ZYLOPRIM) 100 MG tablet Take 100 mg by mouth daily.    [name]. atorvastatin (LIPITOR) 20 MG tablet Take 20 mg by mouth daily at 6 PM.     . carvedilol (COREG) 12.5 MG tablet Take 1 tablet (12.5 mg total) by mouth 2 (two) times daily with a meal. 180 tablet 2  . cholecalciferol (VITAMIN D) 1000 units tablet Take 1,000 Units by mouth daily.    . furosemide (LASIX) 20 MG tablet Take 20 mg by mouth daily.    [name]. lisinopril (PRINIVIL,ZESTRIL) 40 MG tablet Take 40 mg by mouth daily.    . metolazone (ZAROXOLYN) 2.5 MG tablet Take 1 tablet (2.5 mg total) by mouth daily. 90 tablet 2  . polyethylene glycol (MIRALAX / GLYCOLAX) packet Take 17 g by mouth 2 (two) times daily. (Patient taking differently: Take 17 g by mouth daily as needed (constipation). ) 14 each 0  . spironolactone (ALDACTONE) 25 MG tablet TAKE 1 TABLET EVERY DAY 90 tablet 2  . tamsulosin (FLOMAX) 0.4 MG CAPS capsule Take  1 capsule (0.4 mg total) by mouth 2 (two) times daily. 180 capsule 2  . warfarin (COUMADIN) 5 MG tablet TAKE 1 TABLET BY MOUTH DAILY AS DIRECTED BY COUMADIN CLINIC (Patient taking differently: TAKE 1 TABLET BY MOUTH DAILY AS DIRECTED BY COUMADIN CLINIC. Takes 5mg  daily except 2.5mg  Thursdays.) 90 tablet 1   No current facility-administered medications for this visit.      Past Medical History:  Diagnosis Date  . Anemia   . Atrial fibrillation (HCC)   . BPH (benign prostatic hyperplasia)   . Bradycardia   . Cardiomyopathy   . CHF (congestive heart failure) (HCC)   . Diabetes mellitus type II   . GERD (gastroesophageal reflux disease)   . History of colonoscopy   . HTN (hypertension)   . Hyperlipidemia   . ICD (implantable cardiac defibrillator) in place 10/14/2012   biventricular  . OA (osteoarthritis)   . Obesity   . Peptic ulcer     Past Surgical History:  Procedure Laterality Date  . EP IMPLANTABLE DEVICE Left   . EP IMPLANTABLE DEVICE N/A 07/27/2016   Procedure: BIV Pacemaker downgrade;  Surgeon: Marinus MawGregg W Taylor, MD;  [facility] IN[facility];  Service: Cardiovascular;  Laterality: N/A;  . ESOPHAGOGASTRODUODENOSCOPY  06/24/2002  . L-spine  1965  . Lumbar L4-5 & S1  02/2000  . TOTAL KNEE ARTHROPLASTY  1997  right  . TOTAL KNEE ARTHROPLASTY Left 02/15/2015   Procedure: LEFT TOTAL KNEE ARTHROPLASTY;  Surgeon: Durene Romans, MD;  Location: [facility];  Service: Orthopedics;  Laterality: Left;    Social History   Social History  . Marital status: Widowed    Spouse name: N/A  . Number of children: N/A  . Years of education: N/A   Occupational History  . Retired    Social History Main Topics  . Smoking status: Never Smoker  . Smokeless tobacco: Never Used  . Alcohol use No  . Drug use: No  . Sexual activity: Not on file   Other Topics Concern  . Not on file   Social History Narrative  . No narrative on file    Family History  Problem Relation Age of Onset  .  Heart disease Father   . Heart disease Mother   . Diabetes Mother   . Hypertension Brother   . Hypertension Brother   . Cancer Neg Hx     ROS: Knee pain but no fevers or chills, productive cough, hemoptysis, dysphasia, odynophagia, melena, hematochezia, dysuria, hematuria, rash, seizure activity, orthopnea, PND, claudication. Remaining systems are negative.  Physical Exam: Well-developed well-nourished in no acute distress.  Skin is warm and dry.  HEENT is normal.  Neck is supple.  Chest is clear to auscultation with normal expansion.  Cardiovascular exam is regular rate and rhythm.  Abdominal exam nontender or distended. No masses palpated. Extremities show 2+ ankle edema. neuro grossly intact   A/P  1 Hyperlipidemia-continue statin.  2 hypertension-blood pressure controlled. Continue present medications.  3 cardiomyopathy-continue beta blocker and ACE inhibitor.  4 atrial fibrillation-continue beta blocker for rate control. Continue Coumadin.   5 ICD-followed by electrophysiology.   6 chronic combined systolic/diastolic congestive heart failure-patient is much improved after reducing his diuretic dose. He was somewhat dehydrated before. I will continue with his present dose of diuretics. Check potassium and renal function. He will take an additional 40 mg daily for increasing weight of 2-3 pounds or dyspnea.   Olga Millers MD

## 2016-10-15 ENCOUNTER — Encounter: Payer: Self-pay | Admitting: Cardiology

## 2016-10-15 ENCOUNTER — Ambulatory Visit (INDEPENDENT_AMBULATORY_CARE_PROVIDER_SITE_OTHER): Payer: Commercial Managed Care - HMO | Admitting: Pharmacist Clinician (PhC)/ Clinical Pharmacy Specialist

## 2016-10-15 ENCOUNTER — Ambulatory Visit (INDEPENDENT_AMBULATORY_CARE_PROVIDER_SITE_OTHER): Payer: Commercial Managed Care - HMO | Admitting: Cardiology

## 2016-10-15 VITALS — BP 102/66 | HR 80 | Ht 68.0 in | Wt 258.0 lb

## 2016-10-15 DIAGNOSIS — I482 Chronic atrial fibrillation: Secondary | ICD-10-CM | POA: Diagnosis not present

## 2016-10-15 DIAGNOSIS — Z7901 Long term (current) use of anticoagulants: Secondary | ICD-10-CM

## 2016-10-15 DIAGNOSIS — Z5181 Encounter for therapeutic drug level monitoring: Secondary | ICD-10-CM

## 2016-10-15 DIAGNOSIS — I420 Dilated cardiomyopathy: Secondary | ICD-10-CM | POA: Diagnosis not present

## 2016-10-15 DIAGNOSIS — I4821 Permanent atrial fibrillation: Secondary | ICD-10-CM

## 2016-10-15 DIAGNOSIS — Z95810 Presence of automatic (implantable) cardiac defibrillator: Secondary | ICD-10-CM | POA: Diagnosis not present

## 2016-10-15 DIAGNOSIS — I5022 Chronic systolic (congestive) heart failure: Secondary | ICD-10-CM

## 2016-10-15 DIAGNOSIS — I4891 Unspecified atrial fibrillation: Secondary | ICD-10-CM | POA: Diagnosis not present

## 2016-10-15 LAB — POCT INR: INR: 2.5

## 2016-10-15 NOTE — Patient Instructions (Signed)
Medication Instructions:   NO CHANGE  Labwork:  Your physician recommends that you HAVE LAB WORK TODAY  Follow-Up:  Your physician recommends that you schedule a follow-up appointment in: 12 WEEKS WITH DR CRENSHAW     

## 2016-10-16 LAB — BASIC METABOLIC PANEL
BUN: 41 mg/dL — AB (ref 7–25)
CHLORIDE: 103 mmol/L (ref 98–110)
CO2: 23 mmol/L (ref 20–31)
Calcium: 9.3 mg/dL (ref 8.6–10.3)
Creat: 1.23 mg/dL — ABNORMAL HIGH (ref 0.70–1.11)
Glucose, Bld: 125 mg/dL — ABNORMAL HIGH (ref 65–99)
POTASSIUM: 5 mmol/L (ref 3.5–5.3)
Sodium: 135 mmol/L (ref 135–146)

## 2016-10-16 LAB — BRAIN NATRIURETIC PEPTIDE: Brain Natriuretic Peptide: 209.3 pg/mL — ABNORMAL HIGH (ref <100)

## 2016-10-24 ENCOUNTER — Encounter: Payer: Self-pay | Admitting: Internal Medicine

## 2016-10-24 ENCOUNTER — Ambulatory Visit (INDEPENDENT_AMBULATORY_CARE_PROVIDER_SITE_OTHER): Payer: Commercial Managed Care - HMO | Admitting: Internal Medicine

## 2016-10-24 VITALS — BP 98/68 | HR 64 | Ht 68.0 in | Wt 259.2 lb

## 2016-10-24 DIAGNOSIS — I482 Chronic atrial fibrillation, unspecified: Secondary | ICD-10-CM

## 2016-10-24 DIAGNOSIS — Z950 Presence of cardiac pacemaker: Secondary | ICD-10-CM

## 2016-10-24 DIAGNOSIS — I5022 Chronic systolic (congestive) heart failure: Secondary | ICD-10-CM

## 2016-10-24 LAB — CUP PACEART INCLINIC DEVICE CHECK
Battery Voltage: 3.01 V
Brady Statistic RA Percent Paced: 0 %
Implantable Lead Implant Date: 20131105
Implantable Lead Location: 753859
Implantable Lead Location: 753860
Implantable Lead Model: 4296
Implantable Pulse Generator Implant Date: 20170818
Lead Channel Impedance Value: 646 Ohm
Lead Channel Pacing Threshold Pulse Width: 0.4 ms
Lead Channel Pacing Threshold Pulse Width: 1.5 ms
Lead Channel Sensing Intrinsic Amplitude: 8.375 mV
Lead Channel Sensing Intrinsic Amplitude: 8.375 mV
Lead Channel Setting Pacing Amplitude: 2 V
Lead Channel Setting Pacing Amplitude: 2.5 V
Lead Channel Setting Pacing Pulse Width: 0.4 ms
Lead Channel Setting Pacing Pulse Width: 1.5 ms
MDC IDC LEAD IMPLANT DT: 20131105
MDC IDC LEAD IMPLANT DT: 20131105
MDC IDC LEAD LOCATION: 753858
MDC IDC LEAD MODEL: 6935
MDC IDC MSMT BATTERY REMAINING LONGEVITY: 69 mo
MDC IDC MSMT LEADCHNL LV IMPEDANCE VALUE: 361 Ohm
MDC IDC MSMT LEADCHNL LV IMPEDANCE VALUE: 418 Ohm
MDC IDC MSMT LEADCHNL LV IMPEDANCE VALUE: 456 Ohm
MDC IDC MSMT LEADCHNL LV IMPEDANCE VALUE: 513 Ohm
MDC IDC MSMT LEADCHNL LV PACING THRESHOLD AMPLITUDE: 1 V
MDC IDC MSMT LEADCHNL RV IMPEDANCE VALUE: 342 Ohm
MDC IDC MSMT LEADCHNL RV IMPEDANCE VALUE: 361 Ohm
MDC IDC MSMT LEADCHNL RV PACING THRESHOLD AMPLITUDE: 0.5 V
MDC IDC SESS DTM: 20171115164109
MDC IDC SET LEADCHNL RV SENSING SENSITIVITY: 0.9 mV
MDC IDC STAT BRADY AP VP PERCENT: 0 %
MDC IDC STAT BRADY AP VS PERCENT: 0 %
MDC IDC STAT BRADY AS VP PERCENT: 0 %
MDC IDC STAT BRADY AS VS PERCENT: 0 %
MDC IDC STAT BRADY RV PERCENT PACED: 78.13 %

## 2016-10-24 MED ORDER — LISINOPRIL 20 MG PO TABS: 20.0000 mg | ORAL_TABLET | Freq: Every day | ORAL | 11 refills | Status: DC

## 2016-10-24 NOTE — Progress Notes (Signed)
HPI [name] returns today for followup. He is an 82 year old man with a nonischemic cardiomyopathy, chronic systolic heart failure, paroxysmal atrial fibrillation, status post biventricular ICD implantation with down grade to a biv PPM. His heart failure symptoms have improved from class III to class II. His peripheral edema is much better but still present.  No fever or chills. He has occasional fullness in his chest. He denies chest pain.  Since his last visit, he has been stable.  He denies chest pain or sob. No residual pain from his knee surgery. He is walking much better. His only complaint is that his blood pressure has been a little low, running in the 90's at home. No Known Allergies   Current Outpatient Prescriptions  Medication Sig Dispense Refill  . allopurinol (ZYLOPRIM) 100 MG tablet Take 100 mg by mouth daily.    [name]. atorvastatin (LIPITOR) 20 MG tablet Take 20 mg by mouth daily at 6 PM.     . carvedilol (COREG) 12.5 MG tablet Take 1 tablet (12.5 mg total) by mouth 2 (two) times daily with a meal. 180 tablet 2  . cholecalciferol (VITAMIN D) 1000 units tablet Take 1,000 Units by mouth daily.    . furosemide (LASIX) 20 MG tablet Take 20 mg by mouth daily.    [name]. lisinopril (PRINIVIL,ZESTRIL) 40 MG tablet Take 40 mg by mouth daily.    . metolazone (ZAROXOLYN) 2.5 MG tablet Take 1 tablet (2.5 mg total) by mouth daily. 90 tablet 2  . polyethylene glycol (MIRALAX / GLYCOLAX) packet Take 17 g by mouth 2 (two) times daily. 14 each 0  . spironolactone (ALDACTONE) 25 MG tablet Take 25 mg by mouth daily.    . tamsulosin (FLOMAX) 0.4 MG CAPS capsule Take 1 capsule (0.4 mg total) by mouth 2 (two) times daily. 180 capsule 2  . warfarin (COUMADIN) 5 MG tablet TAKE 1 TABLET BY MOUTH DAILY AS DIRECTED BY COUMADIN CLINIC (Patient taking differently: TAKE 1 TABLET BY MOUTH DAILY AS DIRECTED BY COUMADIN CLINIC. Takes 5mg  daily except 2.5mg  Thursdays.) 90 tablet 1   No current facility-administered  medications for this visit.      Past Medical History:  Diagnosis Date  . Anemia   . Atrial fibrillation (HCC)   . BPH (benign prostatic hyperplasia)   . Bradycardia   . Cardiomyopathy   . CHF (congestive heart failure) (HCC)   . Diabetes mellitus type II   . GERD (gastroesophageal reflux disease)   . History of colonoscopy   . HTN (hypertension)   . Hyperlipidemia   . ICD (implantable cardiac defibrillator) in place 10/14/2012   biventricular  . OA (osteoarthritis)   . Obesity   . Peptic ulcer     ROS:   All systems reviewed and negative except as noted in the HPI.   Past Surgical History:  Procedure Laterality Date  . EP IMPLANTABLE DEVICE Left   . EP IMPLANTABLE DEVICE N/A 07/27/2016   Procedure: BIV Pacemaker downgrade;  Surgeon: Marinus MawGregg W Cassia Fein, MD;  [facility];  Service: Cardiovascular;  Laterality: N/A;  . ESOPHAGOGASTRODUODENOSCOPY  06/24/2002  . L-spine  1965  . Lumbar L4-5 & S1  02/2000  . TOTAL KNEE ARTHROPLASTY  1997   right  . TOTAL KNEE ARTHROPLASTY Left 02/15/2015   Procedure: LEFT TOTAL KNEE ARTHROPLASTY;  Surgeon: Durene RomansMatthew Olin, MD;  Location: [facility];  Service: Orthopedics;  Laterality: Left;     Family History  Problem Relation Age of Onset  . Heart  disease Father   . Heart disease Mother   . Diabetes Mother   . Hypertension Brother   . Hypertension Brother   . Cancer Neg Hx      Social History   Social History  . Marital status: Widowed    Spouse name: N/A  . Number of children: N/A  . Years of education: N/A   Occupational History  . Retired    Social History Main Topics  . Smoking status: Never Smoker  . Smokeless tobacco: Never Used  . Alcohol use No  . Drug use: No  . Sexual activity: Not on file   Other Topics Concern  . Not on file   Social History Narrative  . No narrative on file     BP 98/68   Pulse 64   Ht 5\' 8"  (1.727 m)   Wt 259 lb 3.2 oz (117.6 kg)   BMI 39.41 kg/m   Physical  Exam:  Well appearing 82 year old man,NAD HEENT: Unremarkable Neck:  7 cm JVD, no thyromegally Lungs:  Clear with no wheezes, rales, or rhonchi. Well-healed ICD incision. HEART:  Regular rate rhythm, no murmurs, no rubs, no clicks Abd:  soft, positive bowel sounds, no organomegally, no rebound, no guarding Ext:  2 plus pulses, no edema, no cyanosis, no clubbing Skin:  No rashes no nodules Neuro:  CN II through XII intact, motor grossly intact  DEVICE  Normal device function.  See PaceArt for details.   Assess/Plan: 1. Chronic systolic heart failure - his symptoms remain class 2. He will continue his current meds except we will reduce his dose of lisinopril as the BP has been low. 2. Atrial fib - he is now chronically in atrial fib. Will follow. His rate is controlled. 3. PPM - his BiV PPM is working normally. Will follow. 4. Ventricular ectopy - his device interrogation demonstrates a lot of PVC's. He does not feel palpitations. I do not have a good way to reduce them.  Leonia Reeves.D.

## 2016-10-24 NOTE — Patient Instructions (Addendum)
Medication Instructions:  Your physician has recommended you make the following change in your medication:  1) DECREASE Lisinopril to 20 mg daily    Labwork: None Ordered   Testing/Procedures: None Ordered   Follow-Up: Your physician wants you to follow-up in: 1 year with Dr. Ladona Ridgel.  You will receive a reminder letter in the mail two months in advance. If you don't receive a letter, please call our office to schedule the follow-up appointment.  Remote monitoring is used to monitor your Pacemaker from home. This monitoring reduces the number of office visits required to check your device to one time per year. It allows [place] to keep an eye on the functioning of your device to ensure it is working properly. You are scheduled for a device check from home on 01/23/17. You may send your transmission at any time that day. If you have a wireless device, the transmission will be sent automatically. After your physician reviews your transmission, you will receive a postcard with your next transmission date.    Any Other Special Instructions Will Be Listed Below (If Applicable).     If you need a refill on your cardiac medications before your next appointment, please call your pharmacy.

## 2016-11-05 ENCOUNTER — Other Ambulatory Visit: Payer: Self-pay | Admitting: Endocrinology

## 2016-11-07 ENCOUNTER — Ambulatory Visit (INDEPENDENT_AMBULATORY_CARE_PROVIDER_SITE_OTHER): Payer: Commercial Managed Care - HMO | Admitting: *Deleted

## 2016-11-07 DIAGNOSIS — Z5181 Encounter for therapeutic drug level monitoring: Secondary | ICD-10-CM

## 2016-11-07 DIAGNOSIS — I4891 Unspecified atrial fibrillation: Secondary | ICD-10-CM

## 2016-11-07 DIAGNOSIS — Z7901 Long term (current) use of anticoagulants: Secondary | ICD-10-CM

## 2016-11-07 LAB — POCT INR: INR: 2.7

## 2016-11-30 ENCOUNTER — Other Ambulatory Visit: Payer: Self-pay | Admitting: Cardiology

## 2016-12-06 ENCOUNTER — Ambulatory Visit (INDEPENDENT_AMBULATORY_CARE_PROVIDER_SITE_OTHER): Payer: Commercial Managed Care - HMO | Admitting: *Deleted

## 2016-12-06 DIAGNOSIS — Z5181 Encounter for therapeutic drug level monitoring: Secondary | ICD-10-CM

## 2016-12-06 DIAGNOSIS — I4891 Unspecified atrial fibrillation: Secondary | ICD-10-CM | POA: Diagnosis not present

## 2016-12-06 DIAGNOSIS — Z7901 Long term (current) use of anticoagulants: Secondary | ICD-10-CM | POA: Diagnosis not present

## 2016-12-06 LAB — POCT INR: INR: 3

## 2016-12-21 ENCOUNTER — Other Ambulatory Visit: Payer: Self-pay | Admitting: Endocrinology

## 2016-12-28 ENCOUNTER — Ambulatory Visit: Payer: Commercial Managed Care - HMO | Admitting: Endocrinology

## 2017-01-10 NOTE — Progress Notes (Signed)
HPI: FU nonischemic cardiomyopathy as well as atrial fibrillation. Note, he had a catheterization in January 2006 that showed no coronary artery disease and an ejection fraction of 40%. He had a Myoview performed last on May 04, 2008, that showed an ejection fraction of 31%. There was felt to be a prior inferior infarct with mild peri-infarct ischemia. I did review this and felt there was a low risk and we have been treating medically. He does have permanent atrial fibrillation as well. Holter monitor in August of 2011 showed mildly reduced rate and we decreased his Coreg. In November of 2013 the patient had a biventricular ICD placed; downgraded to BIV pacemaker 8/17. Echo 7/17 showed EF 35-40, mild MR; mild LAE; mild RVE with reduced function; mild RAE. Since I last saw him, he denies dyspnea, chest pain, palpitations or syncope. Chronic pedal edema.  Current Outpatient Prescriptions  Medication Sig Dispense Refill  . allopurinol (ZYLOPRIM) 100 MG tablet TAKE 1 TABLET EVERY DAY 90 tablet 1  . atorvastatin (LIPITOR) 20 MG tablet TAKE 1 TABLET EVERY DAY 90 tablet 1  . carvedilol (COREG) 12.5 MG tablet TAKE 1 TABLET TWICE DAILY WITH MEALS 180 tablet 4  . cholecalciferol (VITAMIN D) 1000 units tablet Take 1,000 Units by mouth daily.    . furosemide (LASIX) 20 MG tablet Take 20 mg by mouth daily.    [name] lisinopril (PRINIVIL,ZESTRIL) 20 MG tablet Take 1 tablet (20 mg total) by mouth daily. 30 tablet 11  . metolazone (ZAROXOLYN) 2.5 MG tablet Take 1 tablet (2.5 mg total) by mouth daily. 90 tablet 2  . polyethylene glycol (MIRALAX / GLYCOLAX) packet Take 17 g by mouth 2 (two) times daily. 14 each 0  . spironolactone (ALDACTONE) 25 MG tablet Take 25 mg by mouth daily.    . tamsulosin (FLOMAX) 0.4 MG CAPS capsule Take 1 capsule (0.4 mg total) by mouth 2 (two) times daily. 180 capsule 2  . warfarin (COUMADIN) 5 MG tablet TAKE 1 TABLET BY MOUTH DAILY AS DIRECTED BY COUMADIN CLINIC (Patient taking  differently: TAKE 1 TABLET BY MOUTH DAILY AS DIRECTED BY COUMADIN CLINIC. Takes 5mg  daily except 2.5mg  Thursdays.) 90 tablet 1   No current facility-administered medications for this visit.      Past Medical History:  Diagnosis Date  . Anemia   . Atrial fibrillation (HCC)   . BPH (benign prostatic hyperplasia)   . Bradycardia   . Cardiomyopathy   . CHF (congestive heart failure) (HCC)   . Diabetes mellitus type II   . GERD (gastroesophageal reflux disease)   . History of colonoscopy   . HTN (hypertension)   . Hyperlipidemia   . ICD (implantable cardiac defibrillator) in place 10/14/2012   biventricular  . OA (osteoarthritis)   . Obesity   . Peptic ulcer     Past Surgical History:  Procedure Laterality Date  . EP IMPLANTABLE DEVICE Left   . EP IMPLANTABLE DEVICE N/A 07/27/2016   Procedure: BIV Pacemaker downgrade;  Surgeon: Marinus Maw, MD;  Location: [facility] INVASIVE CV LAB;  Service: Cardiovascular;  Laterality: N/A;  . ESOPHAGOGASTRODUODENOSCOPY  06/24/2002  . L-spine  1965  . Lumbar L4-5 & S1  02/2000  . TOTAL KNEE ARTHROPLASTY  1997   right  . TOTAL KNEE ARTHROPLASTY Left 02/15/2015   Procedure: LEFT TOTAL KNEE ARTHROPLASTY;  Surgeon: Durene Romans, MD;  Location: [facility];  Service: Orthopedics;  Laterality: Left;    Social History   Social History  .  Marital status: Widowed    Spouse name: N/A  . Number of children: N/A  . Years of education: N/A   Occupational History  . Retired    Social History Main Topics  . Smoking status: Never Smoker  . Smokeless tobacco: Never Used  . Alcohol use No  . Drug use: No  . Sexual activity: Not on file   Other Topics Concern  . Not on file   Social History Narrative  . No narrative on file    Family History  Problem Relation Age of Onset  . Heart disease Father   . Heart disease Mother   . Diabetes Mother   . Hypertension Brother   . Hypertension Brother   . Cancer Neg Hx     ROS: no fevers or chills,  productive cough, hemoptysis, dysphasia, odynophagia, melena, hematochezia, dysuria, hematuria, rash, seizure activity, orthopnea, PND, claudication. Remaining systems are negative.  Physical Exam: Well-developed well-nourished in no acute distress.  Skin is warm and dry.  HEENT is normal.  Neck is supple.  Chest is clear to auscultation with normal expansion.  Cardiovascular exam is regular and bradycardic Abdominal exam nontender or distended. No masses palpated. Extremities show 2+ ankle edema. neuro grossly intact   A/P  1 chronic combined systolic/diastolic congestive heart failure-patient is doing well symptomatically. He has chronic pedal edema unchanged. We will continue with present dose of diuretic. Check potassium and renal function.  2 permanent atrial fibrillation-continue beta blocker for rate control. Continue Coumadin.  3 cardiomyopathy-continue ACE inhibitor and beta blocker.  4 hyperlipidemia-continue statin.  5 hypertension-blood pressure controlled. Continue present medications.  6 ICD-followed by electrophysiology.  Olga Millers, MD

## 2017-01-11 ENCOUNTER — Ambulatory Visit (INDEPENDENT_AMBULATORY_CARE_PROVIDER_SITE_OTHER): Payer: Medicare HMO | Admitting: *Deleted

## 2017-01-11 ENCOUNTER — Encounter: Payer: Self-pay | Admitting: Endocrinology

## 2017-01-11 ENCOUNTER — Ambulatory Visit (INDEPENDENT_AMBULATORY_CARE_PROVIDER_SITE_OTHER): Payer: Medicare HMO | Admitting: Endocrinology

## 2017-01-11 VITALS — BP 110/70 | HR 60 | Ht 68.0 in | Wt 257.0 lb

## 2017-01-11 DIAGNOSIS — Z7901 Long term (current) use of anticoagulants: Secondary | ICD-10-CM

## 2017-01-11 DIAGNOSIS — Z0000 Encounter for general adult medical examination without abnormal findings: Secondary | ICD-10-CM | POA: Diagnosis not present

## 2017-01-11 DIAGNOSIS — E790 Hyperuricemia without signs of inflammatory arthritis and tophaceous disease: Secondary | ICD-10-CM

## 2017-01-11 DIAGNOSIS — E1151 Type 2 diabetes mellitus with diabetic peripheral angiopathy without gangrene: Secondary | ICD-10-CM

## 2017-01-11 DIAGNOSIS — H9190 Unspecified hearing loss, unspecified ear: Secondary | ICD-10-CM | POA: Diagnosis not present

## 2017-01-11 DIAGNOSIS — Z5181 Encounter for therapeutic drug level monitoring: Secondary | ICD-10-CM | POA: Diagnosis not present

## 2017-01-11 DIAGNOSIS — I5022 Chronic systolic (congestive) heart failure: Secondary | ICD-10-CM | POA: Diagnosis not present

## 2017-01-11 DIAGNOSIS — R413 Other amnesia: Secondary | ICD-10-CM

## 2017-01-11 DIAGNOSIS — I4891 Unspecified atrial fibrillation: Secondary | ICD-10-CM

## 2017-01-11 LAB — POCT GLYCOSYLATED HEMOGLOBIN (HGB A1C): Hemoglobin A1C: 6.5

## 2017-01-11 LAB — POCT INR: INR: 2.7

## 2017-01-11 NOTE — Progress Notes (Signed)
we discussed code status.  pt requests full code, but would not want to be started or maintained on artificial life-support measures if there was not a reasonable chance of recovery 

## 2017-01-11 NOTE — Patient Instructions (Addendum)
Please consider these measures for your health:  minimize alcohol.  Do not use tobacco products.  Have a colonoscopy at least every 10 years from age 83.  Keep firearms safely stored.  Always use seat belts.  have working smoke alarms in your home.  See an eye doctor and dentist regularly.  Never drive under the influence of alcohol or drugs (including prescription drugs).  Those with fair skin should take precautions against the sun, and should carefully examine their skin once per month, for any new or changed moles. please let me know what your wishes would be, if artificial life support measures should become necessary.  It is critically important to prevent falling down (keep floor areas well-lit, dry, and free of loose objects.  If you have a cane, walker, or wheelchair, you should use it, even for short trips around the house.  Wear flat-soled shoes.  Also, try not to rush). good diet and exercise significantly improve the control of your diabetes.  please let me know if you wish to be referred to a dietician.  high blood sugar is very risky to your health.  you should see an eye doctor and dentist every year.  It is very important to get all recommended vaccinations.  Please see a hearing specialist.  you will receive a phone call, about a day and time for an appointment blood tests are requested for you today.  We'll let you know about the results. Please come back for a follow-up appointment in 3 months.

## 2017-01-11 NOTE — Progress Notes (Signed)
Subjective:    Patient ID: [name], male    DOB: 1929-10-18, 83 y.o.   MRN: [id number]  HPI The state of at least three ongoing medical problems is addressed today, with interval history of each noted here:  Pt returns for f/u of diabetes mellitus:  DM type: 2.  Dx'ed: 2008.  Complications: nephropathy.   Therapy: none.  DKA: never.  Severe hypoglycemia: never.   Pancreatitis: never.   Interval history: no weight change.  Hypokalemia: he denies cramps.  He is off KLOR.   CHF: denies sob.   Past Medical History:  Diagnosis Date  . Anemia   . Atrial fibrillation (HCC)   . BPH (benign prostatic hyperplasia)   . Bradycardia   . Cardiomyopathy   . CHF (congestive heart failure) (HCC)   . Diabetes mellitus type II   . GERD (gastroesophageal reflux disease)   . History of colonoscopy   . HTN (hypertension)   . Hyperlipidemia   . ICD (implantable cardiac defibrillator) in place 10/14/2012   biventricular  . OA (osteoarthritis)   . Obesity   . Peptic ulcer     Past Surgical History:  Procedure Laterality Date  . EP IMPLANTABLE DEVICE Left   . EP IMPLANTABLE DEVICE N/A 07/27/2016   Procedure: BIV Pacemaker downgrade;  Surgeon: Marinus Maw, MD;  Location[facility] INVASIVE CV LAB;  Service: Cardiovascular;  Laterality: N/A;  . ESOPHAGOGASTRODUODENOSCOPY  06/24/2002  . L-spine  1965  . Lumbar L4-5 & S1  02/2000  . TOTAL KNEE ARTHROPLASTY  1997   right  . TOTAL KNEE ARTHROPLASTY Left 02/15/2015   Procedure: LEFT TOTAL KNEE ARTHROPLASTY;  Surgeon: Durene Romans, MD;  Location: [facility];  Service: Orthopedics;  Laterality: Left;    Social History   Social History  . Marital status: Widowed    Spouse name: N/A  . Number of children: N/A  . Years of education: N/A   Occupational History  . Retired    Social History Main Topics  . Smoking status: Never Smoker  . Smokeless tobacco: Never Used  . Alcohol use No  . Drug use: No  . Sexual activity: Not on file   Other  Topics Concern  . Not on file   Social History Narrative  . No narrative on file    Current Outpatient Prescriptions on File Prior to Visit  Medication Sig Dispense Refill  . allopurinol (ZYLOPRIM) 100 MG tablet TAKE 1 TABLET EVERY DAY 90 tablet 1  . atorvastatin (LIPITOR) 20 MG tablet TAKE 1 TABLET EVERY DAY 90 tablet 1  . carvedilol (COREG) 12.5 MG tablet TAKE 1 TABLET TWICE DAILY WITH MEALS 180 tablet 4  . cholecalciferol (VITAMIN D) 1000 units tablet Take 1,000 Units by mouth daily.    . furosemide (LASIX) 20 MG tablet Take 20 mg by mouth daily.    [name] lisinopril (PRINIVIL,ZESTRIL) 20 MG tablet Take 1 tablet (20 mg total) by mouth daily. 30 tablet 11  . metolazone (ZAROXOLYN) 2.5 MG tablet Take 1 tablet (2.5 mg total) by mouth daily. 90 tablet 2  . polyethylene glycol (MIRALAX / GLYCOLAX) packet Take 17 g by mouth 2 (two) times daily. 14 each 0  . spironolactone (ALDACTONE) 25 MG tablet Take 25 mg by mouth daily.    . tamsulosin (FLOMAX) 0.4 MG CAPS capsule Take 1 capsule (0.4 mg total) by mouth 2 (two) times daily. 180 capsule 2  . warfarin (COUMADIN) 5 MG tablet TAKE 1 TABLET BY MOUTH DAILY AS  DIRECTED BY COUMADIN CLINIC (Patient taking differently: TAKE 1 TABLET BY MOUTH DAILY AS DIRECTED BY COUMADIN CLINIC. Takes 5mg  daily except 2.5mg  Thursdays.) 90 tablet 1   No current facility-administered medications on file prior to visit.     No Known Allergies  Family History  Problem Relation Age of Onset  . Heart disease Father   . Heart disease Mother   . Diabetes Mother   . Hypertension Brother   . Hypertension Brother   . Cancer Neg Hx    BP 110/70   Pulse 60   Ht 5\' 8"  (1.727 m)   Wt 257 lb (116.6 kg)   SpO2 97%   BMI 39.08 kg/m   Review of Systems Edema is the same.  Denies chest pain.      Objective:   Physical Exam VITAL SIGNS:  See vs page GENERAL: no distress.   Pulses: dorsalis pedis intact bilat.  Feet: no deformity.  2+ bilat leg edema. bilat vv's. There  is bilateral onychomycosis.  The right great toenail is falling off.     Skin: bilat rust discoloration of the legs. Heavy calluses.  no ulcer on the feet. feet are of normal temp, but are cyanotic.  Neuro: sensation is intact to touch on the feet.    A1c=6.5%    Assessment & Plan:  CHF: clinically stable.  Type 2 DM, with nephropathy: well-controlled.   Hypokalemia: due for recheck Please continue the same medications.  Check labs.  Subjective:   Patient here for Medicare annual wellness visit and management of other chronic and acute problems.     Risk factors: advanced age    Roster of Physicians Providing Medical Care to Patient:  See "snapshot"     Activities of Daily Living: In your present state of health, do you have any difficulty performing the following activities (lives alone, but dtr nearby)?:  Preparing food and eating?: No  Bathing yourself: No  Getting dressed: No  Using the toilet: No  Moving around from place to place: No  In the past year have you fallen or had a near fall?: No    Home Safety: Has smoke detector and wears seat belts. No firearms. No excess sun exposure.   Diet and Exercise  Current exercise habits: limited by health probs Dietary issues discussed: pt reports a healthy diet   Depression Screen  Q1: Over the past two weeks, have you felt down, depressed or hopeless? no  Q2: Over the past two weeks, have you felt little interest or pleasure in doing things? no   The following portions of the patient's history were reviewed and updated as appropriate: allergies, current medications, past family history, past medical history, past social history, past surgical history and problem list.   Review of Systems  No change in hearing loss,; no visual loss Objective:   Vision:  Checked today Hearing: grossly decreased from normal Body mass index:  See vs page Msk: pt easily and quickly performs "get-up-and-go" from a sitting position Cognitive  Impairment Assessment: cognition, memory and judgment appear normal.  remembers 2/3 at 5 minutes.  excellent recall.  can easily read and write a sentence.  alert and oriented x 3.     Assessment:   Medicare wellness utd on preventive parameters    Plan:   During the course of the visit the patient was educated and counseled about appropriate screening and preventive services including:        Fall prevention    Diabetes  screening  Nutrition counseling   Vaccines / LABS Zostavax / Pneumococcal Vaccine  today  PSA  Patient Instructions (the written plan) was given to the patient.

## 2017-01-11 NOTE — Evaluation (Signed)
Eye Exam:  Right Eye: 25/50 Left Eye: 20/50

## 2017-01-16 ENCOUNTER — Ambulatory Visit (INDEPENDENT_AMBULATORY_CARE_PROVIDER_SITE_OTHER): Payer: Medicare HMO | Admitting: Cardiology

## 2017-01-16 ENCOUNTER — Encounter: Payer: Self-pay | Admitting: Cardiology

## 2017-01-16 ENCOUNTER — Telehealth: Payer: Self-pay | Admitting: Endocrinology

## 2017-01-16 VITALS — BP 110/69 | HR 49 | Ht 68.0 in | Wt 262.2 lb

## 2017-01-16 DIAGNOSIS — E790 Hyperuricemia without signs of inflammatory arthritis and tophaceous disease: Secondary | ICD-10-CM

## 2017-01-16 DIAGNOSIS — R413 Other amnesia: Secondary | ICD-10-CM | POA: Diagnosis not present

## 2017-01-16 DIAGNOSIS — I482 Chronic atrial fibrillation: Secondary | ICD-10-CM

## 2017-01-16 DIAGNOSIS — E1151 Type 2 diabetes mellitus with diabetic peripheral angiopathy without gangrene: Secondary | ICD-10-CM

## 2017-01-16 DIAGNOSIS — I5022 Chronic systolic (congestive) heart failure: Secondary | ICD-10-CM | POA: Diagnosis not present

## 2017-01-16 DIAGNOSIS — I420 Dilated cardiomyopathy: Secondary | ICD-10-CM | POA: Diagnosis not present

## 2017-01-16 DIAGNOSIS — I4821 Permanent atrial fibrillation: Secondary | ICD-10-CM

## 2017-01-16 DIAGNOSIS — Z950 Presence of cardiac pacemaker: Secondary | ICD-10-CM | POA: Diagnosis not present

## 2017-01-16 LAB — LIPID PANEL
CHOLESTEROL: 138 mg/dL (ref <200)
HDL: 58 mg/dL (ref >40)
LDL Cholesterol: 67 mg/dL (ref <100)
TRIGLYCERIDES: 64 mg/dL (ref <150)
Total CHOL/HDL Ratio: 2.4 Ratio (ref <5.0)
VLDL: 13 mg/dL (ref <30)

## 2017-01-16 LAB — BASIC METABOLIC PANEL
BUN: 48 mg/dL — AB (ref 7–25)
CALCIUM: 9.2 mg/dL (ref 8.6–10.3)
CO2: 21 mmol/L (ref 20–31)
CREATININE: 1.21 mg/dL — AB (ref 0.70–1.11)
Chloride: 106 mmol/L (ref 98–110)
GLUCOSE: 131 mg/dL — AB (ref 65–99)
Potassium: 4.9 mmol/L (ref 3.5–5.3)
Sodium: 136 mmol/L (ref 135–146)

## 2017-01-16 LAB — VITAMIN B12: Vitamin B-12: 481 pg/mL (ref 200–1100)

## 2017-01-16 LAB — TSH: TSH: 1.94 mIU/L (ref 0.40–4.50)

## 2017-01-16 NOTE — Patient Instructions (Signed)
Your physician recommends that you schedule a follow-up appointment in: 3 MONTHS WITH DR CRENSHAW  

## 2017-01-16 NOTE — Telephone Encounter (Signed)
Pt went to [facility] today to do the labs

## 2017-01-17 LAB — URIC ACID: Uric Acid, Serum: 6.1 mg/dL (ref 4.0–8.0)

## 2017-01-18 ENCOUNTER — Encounter: Payer: Self-pay | Admitting: Cardiology

## 2017-01-18 ENCOUNTER — Other Ambulatory Visit: Payer: Self-pay | Admitting: *Deleted

## 2017-01-18 MED ORDER — WARFARIN SODIUM 5 MG PO TABS: ORAL_TABLET | ORAL | 1 refills | Status: DC

## 2017-01-18 MED ORDER — METOLAZONE 2.5 MG PO TABS: 2.5000 mg | ORAL_TABLET | Freq: Every day | ORAL | 3 refills | Status: DC

## 2017-01-18 MED ORDER — LISINOPRIL 20 MG PO TABS: 20.0000 mg | ORAL_TABLET | Freq: Every day | ORAL | 3 refills | Status: DC

## 2017-01-18 MED ORDER — ATORVASTATIN CALCIUM 20 MG PO TABS: 20.0000 mg | ORAL_TABLET | Freq: Every day | ORAL | 3 refills | Status: DC

## 2017-01-18 MED ORDER — CARVEDILOL 12.5 MG PO TABS: 12.5000 mg | ORAL_TABLET | Freq: Two times a day (BID) | ORAL | 4 refills | Status: DC

## 2017-01-18 MED ORDER — ALLOPURINOL 100 MG PO TABS: 100.0000 mg | ORAL_TABLET | Freq: Every day | ORAL | 1 refills | Status: DC

## 2017-01-18 MED ORDER — TAMSULOSIN HCL 0.4 MG PO CAPS: 0.4000 mg | ORAL_CAPSULE | Freq: Two times a day (BID) | ORAL | 2 refills | Status: DC

## 2017-01-18 NOTE — Telephone Encounter (Signed)
This encounter was created in error - please disregard.

## 2017-01-18 NOTE — Telephone Encounter (Signed)
Returning your call. °

## 2017-01-18 NOTE — Telephone Encounter (Signed)
Please call in all dr Everardo All refills to [facility]

## 2017-01-18 NOTE — Telephone Encounter (Signed)
Refills submitted.  

## 2017-01-23 ENCOUNTER — Ambulatory Visit (INDEPENDENT_AMBULATORY_CARE_PROVIDER_SITE_OTHER): Payer: Medicare HMO | Admitting: *Deleted

## 2017-01-23 ENCOUNTER — Telehealth: Payer: Self-pay | Admitting: Cardiology

## 2017-01-23 DIAGNOSIS — I420 Dilated cardiomyopathy: Secondary | ICD-10-CM | POA: Diagnosis not present

## 2017-01-23 NOTE — Telephone Encounter (Signed)
Confirmed remote transmission w/ pt daughter.   

## 2017-01-24 ENCOUNTER — Encounter: Payer: Self-pay | Admitting: Cardiology

## 2017-01-24 ENCOUNTER — Other Ambulatory Visit: Payer: Self-pay | Admitting: Cardiology

## 2017-01-24 NOTE — Progress Notes (Signed)
Remote pacemaker transmission.   

## 2017-01-25 LAB — CUP PACEART REMOTE DEVICE CHECK
Brady Statistic AS VP Percent: 0 %
Brady Statistic RA Percent Paced: 0 %
Brady Statistic RV Percent Paced: 61.17 %
Implantable Lead Implant Date: 20131105
Implantable Lead Location: 753859
Implantable Lead Model: 4296
Implantable Lead Model: 6935
Implantable Pulse Generator Implant Date: 20170818
Lead Channel Impedance Value: 418 Ohm
Lead Channel Impedance Value: 456 Ohm
Lead Channel Impedance Value: 513 Ohm
Lead Channel Impedance Value: 646 Ohm
Lead Channel Sensing Intrinsic Amplitude: 8.375 mV
Lead Channel Setting Pacing Amplitude: 2 V
Lead Channel Setting Pacing Amplitude: 2.25 V
Lead Channel Setting Pacing Pulse Width: 0.4 ms
Lead Channel Setting Pacing Pulse Width: 1.5 ms
Lead Channel Setting Sensing Sensitivity: 0.9 mV
MDC IDC LEAD IMPLANT DT: 20131105
MDC IDC LEAD IMPLANT DT: 20131105
MDC IDC LEAD LOCATION: 753858
MDC IDC LEAD LOCATION: 753860
MDC IDC MSMT BATTERY REMAINING LONGEVITY: 57 mo
MDC IDC MSMT BATTERY VOLTAGE: 3 V
MDC IDC MSMT LEADCHNL LV IMPEDANCE VALUE: 380 Ohm
MDC IDC MSMT LEADCHNL LV PACING THRESHOLD AMPLITUDE: 1.125 V
MDC IDC MSMT LEADCHNL LV PACING THRESHOLD PULSEWIDTH: 1.5 ms
MDC IDC MSMT LEADCHNL RV IMPEDANCE VALUE: 342 Ohm
MDC IDC MSMT LEADCHNL RV IMPEDANCE VALUE: 361 Ohm
MDC IDC MSMT LEADCHNL RV PACING THRESHOLD AMPLITUDE: 0.5 V
MDC IDC MSMT LEADCHNL RV PACING THRESHOLD PULSEWIDTH: 0.4 ms
MDC IDC MSMT LEADCHNL RV SENSING INTR AMPL: 8.375 mV
MDC IDC SESS DTM: 20180215043929
MDC IDC STAT BRADY AP VP PERCENT: 0 %
MDC IDC STAT BRADY AP VS PERCENT: 0 %
MDC IDC STAT BRADY AS VS PERCENT: 0 %

## 2017-02-07 ENCOUNTER — Encounter: Payer: Self-pay | Admitting: Cardiology

## 2017-02-08 ENCOUNTER — Telehealth: Payer: Self-pay | Admitting: Endocrinology

## 2017-02-08 NOTE — Telephone Encounter (Signed)
Noted, thanks!

## 2017-02-08 NOTE — Telephone Encounter (Signed)
See message to be advised.  

## 2017-02-08 NOTE — Telephone Encounter (Signed)
[facility] stated they have been calling the patient and he have not responded,  Please advise

## 2017-02-20 ENCOUNTER — Ambulatory Visit (INDEPENDENT_AMBULATORY_CARE_PROVIDER_SITE_OTHER): Payer: Medicare HMO | Admitting: *Deleted

## 2017-02-20 DIAGNOSIS — I4891 Unspecified atrial fibrillation: Secondary | ICD-10-CM | POA: Diagnosis not present

## 2017-02-20 DIAGNOSIS — Z5181 Encounter for therapeutic drug level monitoring: Secondary | ICD-10-CM | POA: Diagnosis not present

## 2017-02-20 DIAGNOSIS — Z7901 Long term (current) use of anticoagulants: Secondary | ICD-10-CM

## 2017-02-20 LAB — POCT INR: INR: 3

## 2017-04-02 ENCOUNTER — Encounter: Payer: Self-pay | Admitting: Cardiology

## 2017-04-03 ENCOUNTER — Ambulatory Visit (INDEPENDENT_AMBULATORY_CARE_PROVIDER_SITE_OTHER): Payer: Medicare HMO | Admitting: Pharmacist

## 2017-04-03 DIAGNOSIS — Z7901 Long term (current) use of anticoagulants: Secondary | ICD-10-CM

## 2017-04-03 DIAGNOSIS — I4891 Unspecified atrial fibrillation: Secondary | ICD-10-CM

## 2017-04-03 DIAGNOSIS — Z5181 Encounter for therapeutic drug level monitoring: Secondary | ICD-10-CM

## 2017-04-03 LAB — POCT INR: INR: 2.4

## 2017-04-10 ENCOUNTER — Encounter: Payer: Self-pay | Admitting: Endocrinology

## 2017-04-10 ENCOUNTER — Ambulatory Visit (INDEPENDENT_AMBULATORY_CARE_PROVIDER_SITE_OTHER): Payer: Medicare HMO | Admitting: Endocrinology

## 2017-04-10 VITALS — BP 118/74 | HR 89 | Ht 68.0 in | Wt 260.0 lb

## 2017-04-10 DIAGNOSIS — E1151 Type 2 diabetes mellitus with diabetic peripheral angiopathy without gangrene: Secondary | ICD-10-CM

## 2017-04-10 LAB — POCT GLYCOSYLATED HEMOGLOBIN (HGB A1C): HEMOGLOBIN A1C: 6.9

## 2017-04-10 NOTE — Patient Instructions (Signed)
Please continue the same medications. Please come back for a follow-up appointment in 4 months. 

## 2017-04-10 NOTE — Progress Notes (Signed)
Subjective:    Patient ID: [name], male    DOB: 1929-03-17, 83 y.o.   MRN: [id number]  HPI Pt returns for f/u of diabetes mellitus:  DM type: 2.  Dx'ed: 2008.  Complications: renal insufficiency  Therapy: no medication.  DKA: never.  Severe hypoglycemia: never.   Pancreatitis: never.   Interval history: no weight change.   Past Medical History:  Diagnosis Date  . Anemia   . Atrial fibrillation (HCC)   . BPH (benign prostatic hyperplasia)   . Bradycardia   . Cardiomyopathy   . CHF (congestive heart failure) (HCC)   . Diabetes mellitus type II   . GERD (gastroesophageal reflux disease)   . History of colonoscopy   . HTN (hypertension)   . Hyperlipidemia   . ICD (implantable cardiac defibrillator) in place 10/14/2012   biventricular  . OA (osteoarthritis)   . Obesity   . Peptic ulcer     Past Surgical History:  Procedure Laterality Date  . EP IMPLANTABLE DEVICE Left   . EP IMPLANTABLE DEVICE N/A 07/27/2016   Procedure: BIV Pacemaker downgrade;  Surgeon: Marinus Maw, MD;  Location: [facility] INVASIVE CV LAB;  Service: Cardiovascular;  Laterality: N/A;  . ESOPHAGOGASTRODUODENOSCOPY  06/24/2002  . L-spine  1965  . Lumbar L4-5 & S1  02/2000  . TOTAL KNEE ARTHROPLASTY  1997   right  . TOTAL KNEE ARTHROPLASTY Left 02/15/2015   Procedure: LEFT TOTAL KNEE ARTHROPLASTY;  Surgeon: Durene Romans, MD;  Location: [facility];  Service: Orthopedics;  Laterality: Left;    Social History   Social History  . Marital status: Widowed    Spouse name: N/A  . Number of children: N/A  . Years of education: N/A   Occupational History  . Retired    Social History Main Topics  . Smoking status: Never Smoker  . Smokeless tobacco: Never Used  . Alcohol use No  . Drug use: No  . Sexual activity: Not on file   Other Topics Concern  . Not on file   Social History Narrative  . No narrative on file    Current Outpatient Prescriptions on File Prior to Visit  Medication Sig  Dispense Refill  . allopurinol (ZYLOPRIM) 100 MG tablet Take 1 tablet (100 mg total) by mouth daily. 90 tablet 1  . atorvastatin (LIPITOR) 20 MG tablet Take 1 tablet (20 mg total) by mouth daily. 90 tablet 3  . carvedilol (COREG) 12.5 MG tablet Take 1 tablet (12.5 mg total) by mouth 2 (two) times daily with a meal. 180 tablet 4  . cholecalciferol (VITAMIN D) 1000 units tablet Take 1,000 Units by mouth daily.    . furosemide (LASIX) 20 MG tablet Take 20 mg by mouth daily.    [name] lisinopril (PRINIVIL,ZESTRIL) 20 MG tablet Take 1 tablet (20 mg total) by mouth daily. 90 tablet 3  . metolazone (ZAROXOLYN) 2.5 MG tablet Take 1 tablet (2.5 mg total) by mouth daily. 90 tablet 3  . polyethylene glycol (MIRALAX / GLYCOLAX) packet Take 17 g by mouth 2 (two) times daily. 14 each 0  . spironolactone (ALDACTONE) 25 MG tablet Take 25 mg by mouth daily.    . tamsulosin (FLOMAX) 0.4 MG CAPS capsule Take 1 capsule (0.4 mg total) by mouth 2 (two) times daily. 180 capsule 2  . warfarin (COUMADIN) 5 MG tablet TAKE 1 TABLET DAILY AS DIRECTED BY COUMADIN CLINIC 90 tablet 1   No current facility-administered medications on file prior to visit.  No Known Allergies  Family History  Problem Relation Age of Onset  . Heart disease Father   . Heart disease Mother   . Diabetes Mother   . Hypertension Brother   . Hypertension Brother   . Cancer Neg Hx     BP 118/74   Pulse 89   Ht 5\' 8"  (1.727 m)   Wt 260 lb (117.9 kg)   SpO2 98%   BMI 39.53 kg/m    Review of Systems He denies hypoglycemia.     Objective:   Physical Exam VITAL SIGNS:  See vs page GENERAL: no distress.   Pulses: dorsalis pedis intact bilat.  CV: 2+ bilat leg edema. bilat vv's. Feet: no deformity.   There is severe bilateral onychomycosis.    Skin: bilat rust discoloration of the legs. Heavy calluses. no ulcer on the feet.  feet are of normal temp, but are cyanotic.  Neuro: sensation is intact to touch on the feet.     a1c=6.9% Lab Results  Component Value Date   CREATININE 1.21 (H) 01/16/2017   BUN 48 (H) 01/16/2017   NA 136 01/16/2017   K 4.9 01/16/2017   CL 106 01/16/2017   CO2 21 01/16/2017       Assessment & Plan:  Type 2 DM, with renal insufficiency: slightly worse, but no medication is needed now.   Patient Instructions  Please continue the same medications. Please come back for a follow-up appointment in 4 months.

## 2017-04-16 NOTE — Progress Notes (Signed)
HPI: FU nonischemic cardiomyopathy as well as atrial fibrillation. Note, he had a catheterization in January 2006 that showed no coronary artery disease and an ejection fraction of 40%. He had a Myoview performed last on May 04, 2008, that showed an ejection fraction of 31%. There was felt to be a prior inferior infarct with mild peri-infarct ischemia. I did review this and felt there was a low risk and we have been treating medically. He does have permanent atrial fibrillation as well. Holter monitor in August of 2011 showed mildly reduced rate and we decreased his Coreg. In November of 2013 the patient had a biventricular ICD placed; downgraded to BIV pacemaker 8/17. Echo 7/17 showed EF 35-40, mild MR; mild LAE; mild RVE with reduced function; mild RAE. Since I last saw him, he has dyspnea with more vigorous activities but not routine activities. No orthopnea or PND. Chronic pedal edema. No chest pain, palpitations, syncope or bleeding.  Current Outpatient Prescriptions  Medication Sig Dispense Refill  . allopurinol (ZYLOPRIM) 100 MG tablet Take 1 tablet (100 mg total) by mouth daily. 90 tablet 1  . atorvastatin (LIPITOR) 20 MG tablet Take 1 tablet (20 mg total) by mouth daily. 90 tablet 3  . carvedilol (COREG) 12.5 MG tablet Take 1 tablet (12.5 mg total) by mouth 2 (two) times daily with a meal. 180 tablet 4  . cholecalciferol (VITAMIN D) 1000 units tablet Take 1,000 Units by mouth daily.    . furosemide (LASIX) 20 MG tablet Take 20 mg by mouth daily.    [name] lisinopril (PRINIVIL,ZESTRIL) 20 MG tablet Take 1 tablet (20 mg total) by mouth daily. 90 tablet 3  . metolazone (ZAROXOLYN) 2.5 MG tablet Take 1 tablet (2.5 mg total) by mouth daily. 90 tablet 3  . polyethylene glycol (MIRALAX / GLYCOLAX) packet Take 17 g by mouth 2 (two) times daily. 14 each 0  . spironolactone (ALDACTONE) 25 MG tablet Take 25 mg by mouth daily.    . tamsulosin (FLOMAX) 0.4 MG CAPS capsule Take 1 capsule (0.4 mg total)  by mouth 2 (two) times daily. 180 capsule 2  . warfarin (COUMADIN) 5 MG tablet TAKE 1 TABLET DAILY AS DIRECTED BY COUMADIN CLINIC 90 tablet 1   No current facility-administered medications for this visit.      Past Medical History:  Diagnosis Date  . Anemia   . Atrial fibrillation (HCC)   . BPH (benign prostatic hyperplasia)   . Bradycardia   . Cardiomyopathy   . CHF (congestive heart failure) (HCC)   . Diabetes mellitus type II   . GERD (gastroesophageal reflux disease)   . History of colonoscopy   . HTN (hypertension)   . Hyperlipidemia   . ICD (implantable cardiac defibrillator) in place 10/14/2012   biventricular  . OA (osteoarthritis)   . Obesity   . Peptic ulcer     Past Surgical History:  Procedure Laterality Date  . EP IMPLANTABLE DEVICE Left   . EP IMPLANTABLE DEVICE N/A 07/27/2016   Procedure: BIV Pacemaker downgrade;  Surgeon: Marinus Maw, MD;  Location: [facility] INVASIVE CV LAB;  Service: Cardiovascular;  Laterality: N/A;  . ESOPHAGOGASTRODUODENOSCOPY  06/24/2002  . L-spine  1965  . Lumbar L4-5 & S1  02/2000  . TOTAL KNEE ARTHROPLASTY  1997   right  . TOTAL KNEE ARTHROPLASTY Left 02/15/2015   Procedure: LEFT TOTAL KNEE ARTHROPLASTY;  Surgeon: Durene Romans, MD;  Location: [facility];  Service: Orthopedics;  Laterality: Left;  Social History   Social History  . Marital status: Widowed    Spouse name: N/A  . Number of children: N/A  . Years of education: N/A   Occupational History  . Retired    Social History Main Topics  . Smoking status: Never Smoker  . Smokeless tobacco: Never Used  . Alcohol use No  . Drug use: No  . Sexual activity: Not on file   Other Topics Concern  . Not on file   Social History Narrative  . No narrative on file    Family History  Problem Relation Age of Onset  . Heart disease Father   . Heart disease Mother   . Diabetes Mother   . Hypertension Brother   . Hypertension Brother   . Cancer Neg Hx     ROS: arthralgias  but no fevers or chills, productive cough, hemoptysis, dysphasia, odynophagia, melena, hematochezia, dysuria, hematuria, rash, seizure activity, orthopnea, PND, claudication. Remaining systems are negative.  Physical Exam: Well-developed obese in no acute distress.  Skin is warm and dry.  HEENT is normal.  Neck is supple.  Chest is clear to auscultation with normal expansion.  Cardiovascular exam is regular rate and rhythm.  Abdominal exam nontender or distended. No masses palpated. Extremities show 2 + edema. neuro grossly intact  ECG- ventricular paced rhythm. personally reviewed  A/P  1 chronic combined systolic/diastolic congestive heart failure-patient appears to be doing well on his present diuretic regimen. Continue present doses. Check potassium and renal function.  2 permanent atrial fibrillation-continue beta blocker for rate control. Continue Coumadin. Check hemoglobin.   3 cardiomyopathy-continue ACE inhibitor and beta blocker.  4 hypertension-blood pressure controlled. Continue present medications.  5 hyperlipidemia-continue statin.  6 ICD-managed by electrophysiology.  Olga Millers, MD

## 2017-04-22 ENCOUNTER — Ambulatory Visit (INDEPENDENT_AMBULATORY_CARE_PROVIDER_SITE_OTHER): Payer: Medicare HMO | Admitting: Cardiology

## 2017-04-22 ENCOUNTER — Encounter: Payer: Self-pay | Admitting: Cardiology

## 2017-04-22 VITALS — BP 104/68 | HR 84 | Ht 68.0 in | Wt 262.0 lb

## 2017-04-22 DIAGNOSIS — I10 Essential (primary) hypertension: Secondary | ICD-10-CM | POA: Diagnosis not present

## 2017-04-22 DIAGNOSIS — I482 Chronic atrial fibrillation: Secondary | ICD-10-CM | POA: Diagnosis not present

## 2017-04-22 DIAGNOSIS — Z4502 Encounter for adjustment and management of automatic implantable cardiac defibrillator: Secondary | ICD-10-CM | POA: Diagnosis not present

## 2017-04-22 DIAGNOSIS — I420 Dilated cardiomyopathy: Secondary | ICD-10-CM | POA: Diagnosis not present

## 2017-04-22 DIAGNOSIS — I4821 Permanent atrial fibrillation: Secondary | ICD-10-CM

## 2017-04-22 LAB — CBC
HCT: 35.9 % — ABNORMAL LOW (ref 38.5–50.0)
Hemoglobin: 11.9 g/dL — ABNORMAL LOW (ref 13.2–17.1)
MCH: 33 pg (ref 27.0–33.0)
MCHC: 33.1 g/dL (ref 32.0–36.0)
MCV: 99.4 fL (ref 80.0–100.0)
MPV: 12.3 fL (ref 7.5–12.5)
Platelets: 132 K/uL — ABNORMAL LOW (ref 140–400)
RBC: 3.61 MIL/uL — ABNORMAL LOW (ref 4.20–5.80)
RDW: 14.8 % (ref 11.0–15.0)
WBC: 6.2 K/uL (ref 3.8–10.8)

## 2017-04-22 NOTE — Patient Instructions (Signed)

## 2017-04-23 LAB — BASIC METABOLIC PANEL
BUN: 52 mg/dL — ABNORMAL HIGH (ref 7–25)
CALCIUM: 9.1 mg/dL (ref 8.6–10.3)
CO2: 21 mmol/L (ref 20–31)
Chloride: 104 mmol/L (ref 98–110)
Creat: 1.52 mg/dL — ABNORMAL HIGH (ref 0.70–1.11)
Glucose, Bld: 129 mg/dL — ABNORMAL HIGH (ref 65–99)
Potassium: 4.9 mmol/L (ref 3.5–5.3)
SODIUM: 135 mmol/L (ref 135–146)

## 2017-04-24 ENCOUNTER — Telehealth: Payer: Self-pay | Admitting: Cardiology

## 2017-04-24 ENCOUNTER — Encounter: Payer: Medicare HMO | Admitting: *Deleted

## 2017-04-24 NOTE — Telephone Encounter (Signed)
Confirmed remote transmission w/ pt daughter.   

## 2017-04-25 ENCOUNTER — Encounter: Payer: Self-pay | Admitting: Cardiology

## 2017-05-03 ENCOUNTER — Other Ambulatory Visit: Payer: Self-pay | Admitting: Endocrinology

## 2017-05-03 NOTE — Telephone Encounter (Signed)
Please advise if ok to refill. rx is listed under a historical provider. Thanks!  

## 2017-05-15 ENCOUNTER — Ambulatory Visit (INDEPENDENT_AMBULATORY_CARE_PROVIDER_SITE_OTHER): Payer: Medicare HMO

## 2017-05-15 DIAGNOSIS — I4891 Unspecified atrial fibrillation: Secondary | ICD-10-CM

## 2017-05-15 DIAGNOSIS — Z5181 Encounter for therapeutic drug level monitoring: Secondary | ICD-10-CM | POA: Diagnosis not present

## 2017-05-15 DIAGNOSIS — Z7901 Long term (current) use of anticoagulants: Secondary | ICD-10-CM

## 2017-05-15 LAB — POCT INR: INR: 2.5

## 2017-05-20 ENCOUNTER — Ambulatory Visit (INDEPENDENT_AMBULATORY_CARE_PROVIDER_SITE_OTHER): Payer: Medicare HMO | Admitting: *Deleted

## 2017-05-20 DIAGNOSIS — I420 Dilated cardiomyopathy: Secondary | ICD-10-CM | POA: Diagnosis not present

## 2017-05-22 ENCOUNTER — Encounter: Payer: Self-pay | Admitting: Cardiology

## 2017-05-22 LAB — CUP PACEART REMOTE DEVICE CHECK
Battery Remaining Longevity: 54 mo
Battery Voltage: 3 V
Brady Statistic AS VP Percent: 0 %
Brady Statistic AS VS Percent: 0 %
Brady Statistic RA Percent Paced: 0 %
Date Time Interrogation Session: 20180612025228
Implantable Lead Implant Date: 20131105
Implantable Lead Location: 753859
Implantable Lead Location: 753860
Implantable Lead Model: 4296
Implantable Pulse Generator Implant Date: 20170818
Lead Channel Impedance Value: 342 Ohm
Lead Channel Impedance Value: 456 Ohm
Lead Channel Impedance Value: 494 Ohm
Lead Channel Impedance Value: 646 Ohm
Lead Channel Pacing Threshold Amplitude: 0.625 V
Lead Channel Pacing Threshold Amplitude: 1.25 V
Lead Channel Pacing Threshold Pulse Width: 0.4 ms
Lead Channel Setting Pacing Amplitude: 2 V
Lead Channel Setting Pacing Amplitude: 2.25 V
Lead Channel Setting Pacing Pulse Width: 0.4 ms
Lead Channel Setting Pacing Pulse Width: 1.5 ms
Lead Channel Setting Sensing Sensitivity: 0.9 mV
MDC IDC LEAD IMPLANT DT: 20131105
MDC IDC LEAD IMPLANT DT: 20131105
MDC IDC LEAD LOCATION: 753858
MDC IDC MSMT LEADCHNL LV IMPEDANCE VALUE: 380 Ohm
MDC IDC MSMT LEADCHNL LV IMPEDANCE VALUE: 437 Ohm
MDC IDC MSMT LEADCHNL LV PACING THRESHOLD PULSEWIDTH: 1.5 ms
MDC IDC MSMT LEADCHNL RV IMPEDANCE VALUE: 361 Ohm
MDC IDC MSMT LEADCHNL RV SENSING INTR AMPL: 4.125 mV
MDC IDC MSMT LEADCHNL RV SENSING INTR AMPL: 4.125 mV
MDC IDC STAT BRADY AP VP PERCENT: 0 %
MDC IDC STAT BRADY AP VS PERCENT: 0 %
MDC IDC STAT BRADY RV PERCENT PACED: 59.81 %

## 2017-05-22 NOTE — Progress Notes (Signed)
Remote pacemaker transmission.   

## 2017-06-06 ENCOUNTER — Encounter: Payer: Self-pay | Admitting: Cardiology

## 2017-06-26 ENCOUNTER — Ambulatory Visit (INDEPENDENT_AMBULATORY_CARE_PROVIDER_SITE_OTHER): Payer: Medicare HMO | Admitting: *Deleted

## 2017-06-26 DIAGNOSIS — Z5181 Encounter for therapeutic drug level monitoring: Secondary | ICD-10-CM | POA: Diagnosis not present

## 2017-06-26 DIAGNOSIS — I4891 Unspecified atrial fibrillation: Secondary | ICD-10-CM

## 2017-06-26 DIAGNOSIS — Z7901 Long term (current) use of anticoagulants: Secondary | ICD-10-CM

## 2017-06-26 LAB — POCT INR: INR: 3.5

## 2017-07-10 ENCOUNTER — Other Ambulatory Visit: Payer: Self-pay | Admitting: Endocrinology

## 2017-07-11 ENCOUNTER — Other Ambulatory Visit: Payer: Self-pay

## 2017-07-11 MED ORDER — ALLOPURINOL 100 MG PO TABS
100.0000 mg | ORAL_TABLET | Freq: Every day | ORAL | 1 refills | Status: DC
Start: 2017-07-11 — End: 2017-07-19

## 2017-07-17 ENCOUNTER — Ambulatory Visit (INDEPENDENT_AMBULATORY_CARE_PROVIDER_SITE_OTHER): Payer: Medicare HMO | Admitting: *Deleted

## 2017-07-17 DIAGNOSIS — Z5181 Encounter for therapeutic drug level monitoring: Secondary | ICD-10-CM

## 2017-07-17 DIAGNOSIS — Z7901 Long term (current) use of anticoagulants: Secondary | ICD-10-CM | POA: Diagnosis not present

## 2017-07-17 DIAGNOSIS — I4891 Unspecified atrial fibrillation: Secondary | ICD-10-CM

## 2017-07-17 LAB — POCT INR: INR: 2.2

## 2017-07-18 ENCOUNTER — Telehealth: Payer: Self-pay | Admitting: Endocrinology

## 2017-07-18 NOTE — Telephone Encounter (Signed)
MEDICATION: allopurinol (ZYLOPRIM) 100 MG tablet  PHARMACY:    [facility] - [place], [place] - [address] [phone] (Phone) [phone] (Fax)   IS THIS A 90 DAY SUPPLY : yes  IS PATIENT OUT OF MEDICTAION: no  IF NOT; HOW MUCH IS LEFT:5 day supply left  LAST APPOINTMENT DATE:04/10/17  NEXT APPOINTMENT DATE:08/21/17  OTHER COMMENTS: contact [facility] to advise   **Let patient know to contact pharmacy at the end of the day to make sure medication is ready. **  ** Please notify patient to allow 48-72 hours to process**  **Encourage patient to contact the pharmacy for refills or they can request refills through [facility]**

## 2017-07-19 ENCOUNTER — Other Ambulatory Visit: Payer: Self-pay

## 2017-07-19 MED ORDER — ALLOPURINOL 100 MG PO TABS: 100.0000 mg | ORAL_TABLET | Freq: Every day | ORAL | 1 refills | Status: DC

## 2017-07-19 NOTE — Telephone Encounter (Signed)
Routing to you °

## 2017-07-22 NOTE — Telephone Encounter (Signed)
Please send this rx to [facility] for the pt, it was submitted on the 10th but went to [facility] instead, thank you!

## 2017-07-22 NOTE — Telephone Encounter (Signed)
Patients daughter called in to follow up  Sending high priority to supervisor. Advised will have worked asap

## 2017-08-01 ENCOUNTER — Other Ambulatory Visit: Payer: Self-pay | Admitting: Cardiology

## 2017-08-14 ENCOUNTER — Encounter: Payer: Self-pay | Admitting: Podiatry

## 2017-08-14 ENCOUNTER — Ambulatory Visit (INDEPENDENT_AMBULATORY_CARE_PROVIDER_SITE_OTHER): Payer: Medicare Other | Admitting: Podiatry

## 2017-08-14 DIAGNOSIS — M79675 Pain in left toe(s): Secondary | ICD-10-CM | POA: Diagnosis not present

## 2017-08-14 DIAGNOSIS — B351 Tinea unguium: Secondary | ICD-10-CM

## 2017-08-14 DIAGNOSIS — M79674 Pain in right toe(s): Secondary | ICD-10-CM | POA: Diagnosis not present

## 2017-08-14 DIAGNOSIS — Q828 Other specified congenital malformations of skin: Secondary | ICD-10-CM

## 2017-08-14 NOTE — Progress Notes (Signed)
Complaint:  Visit Type: Patient returns to my office for continued preventative foot care services. Complaint: Patient states" my nails have grown long and thick and become painful to walk and wear shoes" Patient has been diagnosed with DM with no foot complications. The patient presents for preventative foot care services. No changes to ROS.  Patient has not been seen in over 2-1/2 years.  Podiatric Exam: Vascular: dorsalis pedis and posterior tibial pulses are palpable bilateral. Capillary return is immediate. Temperature gradient is WNL. Skin turgor WNL  Sensorium: Normal Semmes Weinstein monofilament test. Normal tactile sensation bilaterally. Nail Exam: Pt has thick disfigured discolored nails with subungual debris noted bilateral entire nail hallux through fifth toenails Ulcer Exam: There is no evidence of ulcer or pre-ulcerative changes or infection. Orthopedic Exam: Muscle tone and strength are WNL. No limitations in general ROM. No crepitus or effusions noted. Foot type and digits show no abnormalities. Bony prominences are unremarkable. Skin:  Porokeratosis sib 1 and sub heel right foot.. No infection or ulcers  Diagnosis:  Onychomycosis, , Pain in right toe, pain in left toes Porokeratosis  X 2 right foot.  Treatment & Plan Procedures and Treatment: Consent by patient was obtained for treatment procedures. The patient understood the discussion of treatment and procedures well. All questions were answered thoroughly reviewed. Debridement of mycotic and hypertrophic toenails, 1 through 5 bilateral and clearing of subungual debris. No ulceration, no infection noted. Debridement of porokeratosis  Right foot. Return Visit-Office Procedure: Patient instructed to return to the office for a follow up visit 10 weeks for continued evaluation and treatment.    Helane Gunther DPM

## 2017-08-21 ENCOUNTER — Encounter: Payer: Self-pay | Admitting: Endocrinology

## 2017-08-21 ENCOUNTER — Encounter: Payer: Medicare HMO | Admitting: *Deleted

## 2017-08-21 ENCOUNTER — Telehealth: Payer: Self-pay | Admitting: Cardiology

## 2017-08-21 ENCOUNTER — Telehealth: Payer: Self-pay

## 2017-08-21 ENCOUNTER — Ambulatory Visit (INDEPENDENT_AMBULATORY_CARE_PROVIDER_SITE_OTHER): Payer: Medicare HMO | Admitting: Endocrinology

## 2017-08-21 ENCOUNTER — Other Ambulatory Visit (INDEPENDENT_AMBULATORY_CARE_PROVIDER_SITE_OTHER): Payer: Medicare HMO

## 2017-08-21 VITALS — BP 102/60 | HR 97 | Wt 260.6 lb

## 2017-08-21 DIAGNOSIS — E559 Vitamin D deficiency, unspecified: Secondary | ICD-10-CM

## 2017-08-21 DIAGNOSIS — I509 Heart failure, unspecified: Secondary | ICD-10-CM | POA: Diagnosis not present

## 2017-08-21 DIAGNOSIS — Z23 Encounter for immunization: Secondary | ICD-10-CM | POA: Diagnosis not present

## 2017-08-21 DIAGNOSIS — E1151 Type 2 diabetes mellitus with diabetic peripheral angiopathy without gangrene: Secondary | ICD-10-CM

## 2017-08-21 DIAGNOSIS — D509 Iron deficiency anemia, unspecified: Secondary | ICD-10-CM

## 2017-08-21 LAB — BASIC METABOLIC PANEL
BUN: 105 mg/dL — AB (ref 6–23)
CHLORIDE: 96 meq/L (ref 96–112)
CO2: 23 meq/L (ref 19–32)
CREATININE: 2.63 mg/dL — AB (ref 0.40–1.50)
Calcium: 9.7 mg/dL (ref 8.4–10.5)
GFR: 24.64 mL/min — ABNORMAL LOW (ref >60.00)
Glucose, Bld: 150 mg/dL — ABNORMAL HIGH (ref 70–99)
Potassium: 4.8 mEq/L (ref 3.5–5.1)
Sodium: 131 mEq/L — ABNORMAL LOW (ref 135–145)

## 2017-08-21 LAB — CBC WITH DIFFERENTIAL/PLATELET
BASOS PCT: 0.7 % (ref 0.0–3.0)
Basophils Absolute: 0 10*3/uL (ref 0.0–0.1)
EOS ABS: 0.3 10*3/uL (ref 0.0–0.7)
Eosinophils Relative: 4.1 % (ref 0.0–5.0)
HEMATOCRIT: 34.9 % — AB (ref 39.0–52.0)
Hemoglobin: 11.6 g/dL — ABNORMAL LOW (ref 13.0–17.0)
LYMPHS PCT: 18.7 % (ref 12.0–46.0)
Lymphs Abs: 1.2 10*3/uL (ref 0.7–4.0)
MCHC: 33.2 g/dL (ref 30.0–36.0)
MCV: 100.6 fl — ABNORMAL HIGH (ref 78.0–100.0)
MONO ABS: 0.8 10*3/uL (ref 0.1–1.0)
Monocytes Relative: 12.6 % — ABNORMAL HIGH (ref 3.0–12.0)
NEUTROS ABS: 4 10*3/uL (ref 1.4–7.7)
NEUTROS PCT: 63.9 % (ref 43.0–77.0)
Platelets: 121 10*3/uL — ABNORMAL LOW (ref 150.0–400.0)
RBC: 3.47 Mil/uL — ABNORMAL LOW (ref 4.22–5.81)
RDW: 14.5 % (ref 11.5–15.5)
WBC: 6.3 10*3/uL (ref 4.0–10.5)

## 2017-08-21 LAB — IBC PANEL
IRON: 71 ug/dL (ref 42–165)
Saturation Ratios: 23.5 % (ref 20.0–50.0)
Transferrin: 216 mg/dL (ref 212.0–360.0)

## 2017-08-21 LAB — BRAIN NATRIURETIC PEPTIDE: PRO B NATRI PEPTIDE: 163 pg/mL — AB (ref 0.0–100.0)

## 2017-08-21 LAB — VITAMIN D 25 HYDROXY (VIT D DEFICIENCY, FRACTURES): VITD: 31.7 ng/mL (ref 30.00–100.00)

## 2017-08-21 LAB — POCT GLYCOSYLATED HEMOGLOBIN (HGB A1C): HEMOGLOBIN A1C: 7

## 2017-08-21 NOTE — Telephone Encounter (Signed)
LMOVM reminding pt to send remote transmission.   

## 2017-08-21 NOTE — Telephone Encounter (Signed)
Saa called from LAB and stated that patient has STAT BUN of 103. Reported to Dr. Everardo All ASAP.

## 2017-08-21 NOTE — Progress Notes (Signed)
Subjective:    Patient ID: [name], male    DOB: Mar 30, 1929, 83 y.o.   MRN: [id number]  HPI Pt returns for f/u of diabetes mellitus:  DM type: 2.  Dx'ed: 2008.  Complications: renal insufficiency. Therapy: no medication.  DKA: never.  Severe hypoglycemia: never.   Pancreatitis: never.   Interval history: no change in chronic leg edema.  Past Medical History:  Diagnosis Date  . Anemia   . Atrial fibrillation ([place])   . BPH (benign prostatic hyperplasia)   . Bradycardia   . Cardiomyopathy   . CHF (congestive heart failure) ([place])   . Diabetes mellitus type II   . GERD (gastroesophageal reflux disease)   . History of colonoscopy   . HTN (hypertension)   . Hyperlipidemia   . ICD (implantable cardiac defibrillator) in place 10/14/2012   biventricular  . OA (osteoarthritis)   . Obesity   . Peptic ulcer     Past Surgical History:  Procedure Laterality Date  . EP IMPLANTABLE DEVICE Left   . EP IMPLANTABLE DEVICE N/A 07/27/2016   Procedure: BIV Pacemaker downgrade;  Surgeon: Evans Lance, MD;  Location: [place] CV LAB;  Service: Cardiovascular;  Laterality: N/A;  . ESOPHAGOGASTRODUODENOSCOPY  06/24/2002  . L-spine  1965  . Lumbar L4-5 & S1  02/2000  . TOTAL KNEE ARTHROPLASTY  1997   right  . TOTAL KNEE ARTHROPLASTY Left 02/15/2015   Procedure: LEFT TOTAL KNEE ARTHROPLASTY;  Surgeon: Paralee Cancel, MD;  Location: [facility];  Service: Orthopedics;  Laterality: Left;    Social History   Social History  . Marital status: Widowed    Spouse name: N/A  . Number of children: N/A  . Years of education: N/A   Occupational History  . Retired    Social History Main Topics  . Smoking status: Never Smoker  . Smokeless tobacco: Never Used  . Alcohol use No  . Drug use: No  . Sexual activity: Not on file   Other Topics Concern  . Not on file   Social History Narrative  . No narrative on file    Current Outpatient Prescriptions on File Prior to Visit    Medication Sig Dispense Refill  . allopurinol (ZYLOPRIM) 100 MG tablet Take 1 tablet (100 mg total) by mouth daily. 90 tablet 1  . atorvastatin (LIPITOR) 20 MG tablet Take 1 tablet (20 mg total) by mouth daily. 90 tablet 3  . carvedilol (COREG) 12.5 MG tablet Take 1 tablet (12.5 mg total) by mouth 2 (two) times daily with a meal. 180 tablet 4  . cholecalciferol (VITAMIN D) 1000 units tablet Take 1,000 Units by mouth daily.    . furosemide (LASIX) 20 MG tablet Take 20 mg by mouth daily.    [name] lisinopril (PRINIVIL,ZESTRIL) 20 MG tablet Take 1 tablet (20 mg total) by mouth daily. 90 tablet 3  . metolazone (ZAROXOLYN) 2.5 MG tablet Take 1 tablet (2.5 mg total) by mouth daily. 90 tablet 3  . polyethylene glycol (MIRALAX / GLYCOLAX) packet Take 17 g by mouth 2 (two) times daily. 14 each 0  . spironolactone (ALDACTONE) 25 MG tablet TAKE 1 TABLET EVERY DAY 90 tablet 2  . tamsulosin (FLOMAX) 0.4 MG CAPS capsule Take 1 capsule (0.4 mg total) by mouth 2 (two) times daily. 180 capsule 2  . warfarin (COUMADIN) 5 MG tablet Take 1/2 to 1 tablet daily as directed by coumadin clinic 90 tablet 1   No current facility-administered medications on file prior  to visit.     No Known Allergies  Family History  Problem Relation Age of Onset  . Heart disease Father   . Heart disease Mother   . Diabetes Mother   . Hypertension Brother   . Hypertension Brother   . Cancer Neg Hx     BP 102/60   Pulse 97   Wt 260 lb 9.6 oz (118.2 kg)   SpO2 97%   BMI 39.62 kg/m    Review of Systems No change in chronic doe.    Objective:   Physical Exam VITAL SIGNS:  See vs page GENERAL: no distress.   Pulses: foot pulses are intact bilaterally.   MSK: no deformity of the feet or ankles.  CV: 2+ bilat edema of the legs, and bilat vv's Skin: bilat rust discoloration of the legs. Heavy calluses. no ulcer on the feet, but there are several abrasions.  feet and legs are of normal temp, but are cyanotic.  Neuro:  sensation is intact to touch on the feet and ankles.    Lab Results  Component Value Date   HGBA1C 7.0 08/21/2017   Lab Results  Component Value Date   CREATININE 2.63 (H) 08/21/2017   BUN 105 (HH) 08/21/2017   NA 131 (L) 08/21/2017   K 4.8 08/21/2017   CL 96 08/21/2017   CO2 23 08/21/2017       Assessment & Plan:  Renal failure: d/c diuretic rx, and ret here next week. Type 2 DM: no mediation is needed now Anemia of renal failure: recheck today  Patient Instructions  Please continue the same medications. blood tests are requested for you today.  We'll let you know about the results. Please come back for a follow-up appointment in 3 months.

## 2017-08-21 NOTE — Patient Instructions (Addendum)
Please continue the same medications.   blood tests are requested for you today.  We'll let you know about the results. Please come back for a follow-up appointment in 3 months.  

## 2017-08-23 ENCOUNTER — Encounter: Payer: Self-pay | Admitting: Cardiology

## 2017-08-27 ENCOUNTER — Ambulatory Visit (INDEPENDENT_AMBULATORY_CARE_PROVIDER_SITE_OTHER): Payer: Medicare HMO | Admitting: *Deleted

## 2017-08-27 DIAGNOSIS — Z950 Presence of cardiac pacemaker: Secondary | ICD-10-CM | POA: Diagnosis not present

## 2017-08-27 DIAGNOSIS — I5022 Chronic systolic (congestive) heart failure: Secondary | ICD-10-CM

## 2017-08-27 DIAGNOSIS — I482 Chronic atrial fibrillation, unspecified: Secondary | ICD-10-CM

## 2017-08-28 ENCOUNTER — Ambulatory Visit (INDEPENDENT_AMBULATORY_CARE_PROVIDER_SITE_OTHER): Payer: Medicare HMO | Admitting: *Deleted

## 2017-08-28 DIAGNOSIS — Z5181 Encounter for therapeutic drug level monitoring: Secondary | ICD-10-CM

## 2017-08-28 DIAGNOSIS — I4891 Unspecified atrial fibrillation: Secondary | ICD-10-CM

## 2017-08-28 DIAGNOSIS — Z7901 Long term (current) use of anticoagulants: Secondary | ICD-10-CM

## 2017-08-28 LAB — POCT INR: INR: 3.2

## 2017-08-30 NOTE — Progress Notes (Signed)
Pacemaker remote transmission. 

## 2017-09-24 LAB — CUP PACEART REMOTE DEVICE CHECK
Battery Remaining Longevity: 53 mo
Battery Voltage: 3 V
Brady Statistic AP VP Percent: 0 %
Brady Statistic RA Percent Paced: 0 %
Implantable Lead Implant Date: 20131105
Implantable Lead Location: 753858
Implantable Lead Location: 753860
Implantable Lead Model: 4296
Implantable Lead Model: 5076
Implantable Pulse Generator Implant Date: 20170818
Lead Channel Impedance Value: 342 Ohm
Lead Channel Impedance Value: 361 Ohm
Lead Channel Impedance Value: 437 Ohm
Lead Channel Impedance Value: 475 Ohm
Lead Channel Impedance Value: 646 Ohm
Lead Channel Pacing Threshold Amplitude: 0.625 V
Lead Channel Pacing Threshold Pulse Width: 0.4 ms
Lead Channel Setting Sensing Sensitivity: 0.9 mV
MDC IDC LEAD IMPLANT DT: 20131105
MDC IDC LEAD IMPLANT DT: 20131105
MDC IDC LEAD LOCATION: 753859
MDC IDC MSMT LEADCHNL LV IMPEDANCE VALUE: 380 Ohm
MDC IDC MSMT LEADCHNL LV IMPEDANCE VALUE: 532 Ohm
MDC IDC MSMT LEADCHNL LV PACING THRESHOLD AMPLITUDE: 1.125 V
MDC IDC MSMT LEADCHNL LV PACING THRESHOLD PULSEWIDTH: 1.5 ms
MDC IDC MSMT LEADCHNL RV SENSING INTR AMPL: 4 mV
MDC IDC MSMT LEADCHNL RV SENSING INTR AMPL: 4 mV
MDC IDC SESS DTM: 20180919025257
MDC IDC SET LEADCHNL LV PACING AMPLITUDE: 2.25 V
MDC IDC SET LEADCHNL LV PACING PULSEWIDTH: 1.5 ms
MDC IDC SET LEADCHNL RV PACING AMPLITUDE: 2 V
MDC IDC SET LEADCHNL RV PACING PULSEWIDTH: 0.4 ms
MDC IDC STAT BRADY AP VS PERCENT: 0 %
MDC IDC STAT BRADY AS VP PERCENT: 0 %
MDC IDC STAT BRADY AS VS PERCENT: 0 %
MDC IDC STAT BRADY RV PERCENT PACED: 57.58 %

## 2017-10-01 ENCOUNTER — Other Ambulatory Visit: Payer: Self-pay | Admitting: Endocrinology

## 2017-10-02 ENCOUNTER — Ambulatory Visit (INDEPENDENT_AMBULATORY_CARE_PROVIDER_SITE_OTHER): Payer: Medicare HMO | Admitting: *Deleted

## 2017-10-02 DIAGNOSIS — I4891 Unspecified atrial fibrillation: Secondary | ICD-10-CM | POA: Diagnosis not present

## 2017-10-02 DIAGNOSIS — Z5181 Encounter for therapeutic drug level monitoring: Secondary | ICD-10-CM

## 2017-10-02 DIAGNOSIS — Z7901 Long term (current) use of anticoagulants: Secondary | ICD-10-CM | POA: Diagnosis not present

## 2017-10-02 LAB — POCT INR: INR: 2.9

## 2017-10-23 ENCOUNTER — Encounter: Payer: Self-pay | Admitting: Podiatry

## 2017-10-23 ENCOUNTER — Ambulatory Visit: Payer: Medicare HMO | Admitting: Podiatry

## 2017-10-23 DIAGNOSIS — B351 Tinea unguium: Secondary | ICD-10-CM

## 2017-10-23 DIAGNOSIS — M79675 Pain in left toe(s): Secondary | ICD-10-CM

## 2017-10-23 DIAGNOSIS — D689 Coagulation defect, unspecified: Secondary | ICD-10-CM

## 2017-10-23 DIAGNOSIS — E119 Type 2 diabetes mellitus without complications: Secondary | ICD-10-CM | POA: Diagnosis not present

## 2017-10-23 DIAGNOSIS — M79674 Pain in right toe(s): Secondary | ICD-10-CM | POA: Diagnosis not present

## 2017-10-23 NOTE — Progress Notes (Signed)
Complaint:  Visit Type: Patient returns to my office for continued preventative foot care services. Complaint: Patient states" my nails have grown long and thick and become painful to walk and wear shoes" Patient has been diagnosed with DM with no foot complications. The patient presents for preventative foot care services. No changes to ROS.  Patient is taking coumadin.  Podiatric Exam: Vascular: dorsalis pedis and posterior tibial pulses are palpable bilateral. Capillary return is immediate. Temperature gradient is WNL. Skin turgor WNL  Sensorium: Normal Semmes Weinstein monofilament test. Normal tactile sensation bilaterally. Nail Exam: Pt has thick disfigured discolored nails with subungual debris noted bilateral entire nail hallux through fifth toenails Ulcer Exam: There is no evidence of ulcer or pre-ulcerative changes or infection. Orthopedic Exam: Muscle tone and strength are WNL. No limitations in general ROM. No crepitus or effusions noted. Foot type and digits show no abnormalities. Bony prominences are unremarkable. Skin: Asymptomatic  Porokeratosis  Sub 1 sub heel right foot.. No infection or ulcers  Diagnosis:  Onychomycosis, , Pain in right toe, pain in left toes  Treatment & Plan Procedures and Treatment: Consent by patient was obtained for treatment procedures.   Debridement of mycotic and hypertrophic toenails, 1 through 5 bilateral and clearing of subungual debris. No ulceration, no infection noted.  Return Visit-Office Procedure: Patient instructed to return to the office for a follow up visit 3 months for continued evaluation and treatment.    Shante Archambeault DPM 

## 2017-11-13 ENCOUNTER — Ambulatory Visit (INDEPENDENT_AMBULATORY_CARE_PROVIDER_SITE_OTHER): Payer: Medicare HMO | Admitting: *Deleted

## 2017-11-13 DIAGNOSIS — Z5181 Encounter for therapeutic drug level monitoring: Secondary | ICD-10-CM

## 2017-11-13 DIAGNOSIS — Z7901 Long term (current) use of anticoagulants: Secondary | ICD-10-CM | POA: Diagnosis not present

## 2017-11-13 DIAGNOSIS — I4891 Unspecified atrial fibrillation: Secondary | ICD-10-CM | POA: Diagnosis not present

## 2017-11-13 LAB — POCT INR: INR: 2.8

## 2017-11-13 NOTE — Patient Instructions (Signed)
Continue with 1 tablet daily except 1/2 tablet on Mondays and Thursdays.  Repeat INR 6 weeks.  Call us with any concerns or new medications # [phone].

## 2017-11-19 ENCOUNTER — Ambulatory Visit: Payer: Medicare HMO | Admitting: Endocrinology

## 2017-11-26 ENCOUNTER — Ambulatory Visit: Payer: Medicare HMO | Admitting: Internal Medicine

## 2017-11-26 ENCOUNTER — Encounter: Payer: Self-pay | Admitting: Internal Medicine

## 2017-11-26 VITALS — BP 82/48 | HR 101 | Ht 68.0 in | Wt 250.6 lb

## 2017-11-26 DIAGNOSIS — Z950 Presence of cardiac pacemaker: Secondary | ICD-10-CM | POA: Diagnosis not present

## 2017-11-26 DIAGNOSIS — I480 Paroxysmal atrial fibrillation: Secondary | ICD-10-CM

## 2017-11-26 DIAGNOSIS — I420 Dilated cardiomyopathy: Secondary | ICD-10-CM

## 2017-11-26 DIAGNOSIS — I5022 Chronic systolic (congestive) heart failure: Secondary | ICD-10-CM | POA: Diagnosis not present

## 2017-11-26 MED ORDER — CARVEDILOL 12.5 MG PO TABS: 6.2500 mg | ORAL_TABLET | Freq: Two times a day (BID) | ORAL | 3 refills | Status: DC

## 2017-11-26 MED ORDER — CARVEDILOL 12.5 MG PO TABS
6.2500 mg | ORAL_TABLET | Freq: Two times a day (BID) | ORAL | 3 refills | Status: DC
Start: 2017-11-26 — End: 2018-02-23

## 2017-11-26 MED ORDER — LISINOPRIL 20 MG PO TABS: 10.0000 mg | ORAL_TABLET | Freq: Every day | ORAL | 3 refills | Status: DC

## 2017-11-26 NOTE — Patient Instructions (Signed)
Medication Instructions:  Your physician has recommended you make the following change in your medication:  1.  Decrease your carvedilol to 1/2 tablet (6.25 mg) by mouth twice a day with food. 2.  Decrease your lisinopril to 1/2 tablet (10 mg) by mouth daily.  Labwork: None ordered.  Testing/Procedures: None ordered.  Follow-Up: Your physician wants you to follow-up in: 3 months with Dr. Ladona Ridgel.    Remote monitoring is used to monitor your Pacemaker from home. This monitoring reduces the number of office visits required to check your device to one time per year. It allows [place] to keep an eye on the functioning of your device to ensure it is working properly. You are scheduled for a device check from home on 02/25/2018. You may send your transmission at any time that day. If you have a wireless device, the transmission will be sent automatically. After your physician reviews your transmission, you will receive a postcard with your next transmission date.    Any Other Special Instructions Will Be Listed Below (If Applicable).     If you need a refill on your cardiac medications before your next appointment, please call your pharmacy.

## 2017-11-26 NOTE — Progress Notes (Signed)
HPI [name] returns today for ongoing evaluation and management of chronic systolic heart failure status post biventricular pacemaker insertion.  The patient has class II-III symptoms.  He is recently been bothered by some dizziness and his blood pressures have been on the low side.  He has not had frank syncope and denies chest pain.  He has a history of atrial fibrillation which appears to be permanent.  He does not feel palpitations. No Known Allergies   Current Outpatient Medications  Medication Sig Dispense Refill  . acetaminophen (TYLENOL) 650 MG CR tablet Take 650-1,300 mg by mouth every 8 (eight) hours as needed for pain.    [name]. allopurinol (ZYLOPRIM) 100 MG tablet Take 1 tablet (100 mg total) by mouth daily. 90 tablet 1  . atorvastatin (LIPITOR) 20 MG tablet Take 1 tablet (20 mg total) by mouth daily. 90 tablet 3  . cholecalciferol (VITAMIN D) 1000 units tablet Take 1,000 Units by mouth daily.    . furosemide (LASIX) 20 MG tablet Take 20 mg by mouth daily.    . metolazone (ZAROXOLYN) 2.5 MG tablet Take 1 tablet (2.5 mg total) by mouth daily. 90 tablet 3  . OVER THE COUNTER MEDICATION Take by mouth as needed (as directed). OTC Stomach medication    . polyethylene glycol (MIRALAX / GLYCOLAX) packet Take 17 g by mouth daily as needed (constipation).    [name]. spironolactone (ALDACTONE) 25 MG tablet TAKE 1 TABLET EVERY DAY 90 tablet 2  . tamsulosin (FLOMAX) 0.4 MG CAPS capsule TAKE 1 CAPSULE TWICE DAILY 180 capsule 2  . warfarin (COUMADIN) 5 MG tablet Take 1/2 to 1 tablet daily as directed by coumadin clinic 90 tablet 1  . carvedilol (COREG) 12.5 MG tablet Take 0.5 tablets (6.25 mg total) by mouth 2 (two) times daily. 90 tablet 3  . lisinopril (PRINIVIL,ZESTRIL) 20 MG tablet Take 0.5 tablets (10 mg total) by mouth daily. 45 tablet 3   No current facility-administered medications for this visit.      Past Medical History:  Diagnosis Date  . Anemia   . Atrial fibrillation (HCC)    . BPH (benign prostatic hyperplasia)   . Bradycardia   . Cardiomyopathy   . CHF (congestive heart failure) (HCC)   . Diabetes mellitus type II   . GERD (gastroesophageal reflux disease)   . History of colonoscopy   . HTN (hypertension)   . Hyperlipidemia   . ICD (implantable cardiac defibrillator) in place 10/14/2012   biventricular  . OA (osteoarthritis)   . Obesity   . Peptic ulcer     ROS:   All systems reviewed and negative except as noted in the HPI.   Past Surgical History:  Procedure Laterality Date  . EP IMPLANTABLE DEVICE Left   . EP IMPLANTABLE DEVICE N/A 07/27/2016   Procedure: BIV Pacemaker downgrade;  Surgeon: Marinus MawGregg W Jayton Popelka, MD;  [facility];  Service: Cardiovascular;  Laterality: N/A;  . ESOPHAGOGASTRODUODENOSCOPY  06/24/2002  . L-spine  1965  . Lumbar L4-5 & S1  02/2000  . TOTAL KNEE ARTHROPLASTY  1997   right  . TOTAL KNEE ARTHROPLASTY Left 02/15/2015   Procedure: LEFT TOTAL KNEE ARTHROPLASTY;  Surgeon: Durene RomansMatthew Olin, MD;  Location: [facility];  Service: Orthopedics;  Laterality: Left;     Family History  Problem Relation Age of Onset  . Heart disease Father   . Heart disease Mother   . Diabetes Mother   . Hypertension Brother   .  Hypertension Brother   . Cancer Neg Hx      Social History   Socioeconomic History  . Marital status: Widowed    Spouse name: Not on file  . Number of children: Not on file  . Years of education: Not on file  . Highest education level: Not on file  Social Needs  . Financial resource strain: Not on file  . Food insecurity - worry: Not on file  . Food insecurity - inability: Not on file  . Transportation needs - medical: Not on file  . Transportation needs - non-medical: Not on file  Occupational History  . Occupation: Retired  Tobacco Use  . Smoking status: Never Smoker  . Smokeless tobacco: Never Used  Substance and Sexual Activity  . Alcohol use: No  . Drug use: No  . Sexual activity: Not on  file  Other Topics Concern  . Not on file  Social History Narrative  . Not on file     BP (!) 82/48   Pulse (!) 101   Ht 5\' 8"  (1.727 m)   Wt 250 lb 9.6 oz (113.7 kg)   SpO2 99%   BMI 38.10 kg/m   Physical Exam:  Stable but chronically ill appearing morbidly obese 83 year old man, NAD HEENT: Unremarkable Neck: 6 cm JVD, no thyromegally Lymphatics:  No adenopathy Back:  No CVA tenderness Lungs:  Clear, except for basilar rales.  No increased work of breathing  hEART:  Iregular rate rhythm, no murmurs, no rubs, no clicks Abd:  soft, positive bowel sounds, no organomegally, no rebound, no guarding Ext:  2 plus pulses, no edema, no cyanosis, no clubbing Skin:  No rashes no nodules Neuro:  CN II through XII intact, motor grossly intact  EKG -atrial fibrillation with intermittent ventricular pacing and PVCs  DEVICE  Normal device function.  See PaceArt for details.   Assess/Plan: 1.  Chronic systolic heart failure -today his symptoms appear to be more low output than anything and his blood pressure is low.  I have asked the patient to reduce his dose of carvedilol by half and to reduce his dose of lisinopril by half.  Hopefully his symptoms of fatigue and dizziness will improve 2.  Atrial fibrillation -his ventricular rate is well controlled.  At this time I am not inclined to pursue rhythm control. 3.  Biventricular pacemaker -interrogation of his device demonstrates normal device function. 4.  Obesity -he remains overweight and because of his difficulty with exertion has had difficulty losing weight.  Sharrell Ku, MD

## 2017-11-27 LAB — CUP PACEART INCLINIC DEVICE CHECK
Brady Statistic AP VP Percent: 0 %
Brady Statistic AP VS Percent: 0 %
Brady Statistic AS VP Percent: 0 %
Brady Statistic RA Percent Paced: 0 %
Brady Statistic RV Percent Paced: 58.31 %
Implantable Lead Implant Date: 20131105
Implantable Lead Implant Date: 20131105
Implantable Lead Location: 753859
Implantable Lead Location: 753860
Implantable Lead Model: 4296
Implantable Lead Model: 5076
Implantable Lead Model: 6935
Lead Channel Impedance Value: 361 Ohm
Lead Channel Impedance Value: 361 Ohm
Lead Channel Impedance Value: 418 Ohm
Lead Channel Impedance Value: 456 Ohm
Lead Channel Impedance Value: 513 Ohm
Lead Channel Impedance Value: 627 Ohm
Lead Channel Pacing Threshold Amplitude: 0.5 V
Lead Channel Sensing Intrinsic Amplitude: 4 mV
Lead Channel Setting Pacing Amplitude: 2 V
Lead Channel Setting Pacing Amplitude: 2.25 V
Lead Channel Setting Pacing Pulse Width: 0.4 ms
Lead Channel Setting Pacing Pulse Width: 1.5 ms
MDC IDC LEAD IMPLANT DT: 20131105
MDC IDC LEAD LOCATION: 753858
MDC IDC MSMT BATTERY REMAINING LONGEVITY: 52 mo
MDC IDC MSMT BATTERY VOLTAGE: 3 V
MDC IDC MSMT LEADCHNL LV PACING THRESHOLD AMPLITUDE: 1.125 V
MDC IDC MSMT LEADCHNL LV PACING THRESHOLD PULSEWIDTH: 1.5 ms
MDC IDC MSMT LEADCHNL RV IMPEDANCE VALUE: 342 Ohm
MDC IDC MSMT LEADCHNL RV PACING THRESHOLD PULSEWIDTH: 0.4 ms
MDC IDC MSMT LEADCHNL RV SENSING INTR AMPL: 4 mV
MDC IDC PG IMPLANT DT: 20170818
MDC IDC SESS DTM: 20181219024359
MDC IDC SET LEADCHNL RV SENSING SENSITIVITY: 0.9 mV
MDC IDC STAT BRADY AS VS PERCENT: 0 %

## 2017-12-09 DIAGNOSIS — R001 Bradycardia, unspecified: Secondary | ICD-10-CM | POA: Insufficient documentation

## 2017-12-09 DIAGNOSIS — N400 Enlarged prostate without lower urinary tract symptoms: Secondary | ICD-10-CM | POA: Insufficient documentation

## 2017-12-09 DIAGNOSIS — Z9889 Other specified postprocedural states: Secondary | ICD-10-CM | POA: Insufficient documentation

## 2017-12-09 DIAGNOSIS — K219 Gastro-esophageal reflux disease without esophagitis: Secondary | ICD-10-CM | POA: Insufficient documentation

## 2017-12-09 DIAGNOSIS — M1990 Unspecified osteoarthritis, unspecified site: Secondary | ICD-10-CM | POA: Insufficient documentation

## 2017-12-09 DIAGNOSIS — E669 Obesity, unspecified: Secondary | ICD-10-CM | POA: Insufficient documentation

## 2017-12-09 DIAGNOSIS — E785 Hyperlipidemia, unspecified: Secondary | ICD-10-CM | POA: Insufficient documentation

## 2017-12-14 ENCOUNTER — Other Ambulatory Visit: Payer: Self-pay | Admitting: Endocrinology

## 2017-12-16 ENCOUNTER — Encounter: Payer: Self-pay | Admitting: Endocrinology

## 2017-12-16 ENCOUNTER — Ambulatory Visit: Payer: Medicare HMO | Admitting: Endocrinology

## 2017-12-16 VITALS — BP 122/78 | HR 87 | Temp 97.8°F | Wt 249.4 lb

## 2017-12-16 DIAGNOSIS — D696 Thrombocytopenia, unspecified: Secondary | ICD-10-CM | POA: Diagnosis not present

## 2017-12-16 DIAGNOSIS — E1151 Type 2 diabetes mellitus with diabetic peripheral angiopathy without gangrene: Secondary | ICD-10-CM | POA: Diagnosis not present

## 2017-12-16 DIAGNOSIS — R05 Cough: Secondary | ICD-10-CM

## 2017-12-16 DIAGNOSIS — R059 Cough, unspecified: Secondary | ICD-10-CM

## 2017-12-16 DIAGNOSIS — I509 Heart failure, unspecified: Secondary | ICD-10-CM | POA: Diagnosis not present

## 2017-12-16 LAB — POCT GLYCOSYLATED HEMOGLOBIN (HGB A1C): HEMOGLOBIN A1C: 6.8

## 2017-12-16 MED ORDER — CEFUROXIME AXETIL 250 MG PO TABS: 250.0000 mg | ORAL_TABLET | Freq: Two times a day (BID) | ORAL | 0 refills | Status: DC

## 2017-12-16 MED ORDER — PROMETHAZINE-DM 6.25-15 MG/5ML PO SYRP: 2.5000 mL | ORAL_SOLUTION | Freq: Four times a day (QID) | ORAL | 1 refills | Status: DC | PRN

## 2017-12-16 MED ORDER — FLUTICASONE-SALMETEROL 100-50 MCG/DOSE IN AEPB: 1.0000 | INHALATION_SPRAY | Freq: Two times a day (BID) | RESPIRATORY_TRACT | 3 refills | Status: DC

## 2017-12-16 NOTE — Progress Notes (Signed)
Subjective:    Patient ID: [name], male    DOB: 1929-02-01, 84 y.o.   MRN: [id number]  HPI Pt states 2 weeks of slightly prod cough, rhinorrhea, and assoc wheezing.  Past Medical History:  Diagnosis Date  . Anemia   . Atrial fibrillation (HCC)   . BPH (benign prostatic hyperplasia)   . Bradycardia   . Cardiomyopathy   . CHF (congestive heart failure) (HCC)   . Diabetes mellitus type II   . GERD (gastroesophageal reflux disease)   . History of colonoscopy   . HTN (hypertension)   . Hyperlipidemia   . ICD (implantable cardiac defibrillator) in place 10/14/2012   biventricular  . OA (osteoarthritis)   . Obesity   . Peptic ulcer     Past Surgical History:  Procedure Laterality Date  . EP IMPLANTABLE DEVICE Left   . EP IMPLANTABLE DEVICE N/A 07/27/2016   Procedure: BIV Pacemaker downgrade;  Surgeon: Marinus Maw, MD;  Location[facility] INVASIVE CV LAB;  Service: Cardiovascular;  Laterality: N/A;  . ESOPHAGOGASTRODUODENOSCOPY  06/24/2002  . L-spine  1965  . Lumbar L4-5 & S1  02/2000  . TOTAL KNEE ARTHROPLASTY  1997   right  . TOTAL KNEE ARTHROPLASTY Left 02/15/2015   Procedure: LEFT TOTAL KNEE ARTHROPLASTY;  Surgeon: Durene Romans, MD;  Location: [facility];  Service: Orthopedics;  Laterality: Left;    Social History   Socioeconomic History  . Marital status: Widowed    Spouse name: Not on file  . Number of children: Not on file  . Years of education: Not on file  . Highest education level: Not on file  Social Needs  . Financial resource strain: Not on file  . Food insecurity - worry: Not on file  . Food insecurity - inability: Not on file  . Transportation needs - medical: Not on file  . Transportation needs - non-medical: Not on file  Occupational History  . Occupation: Retired  Tobacco Use  . Smoking status: Never Smoker  . Smokeless tobacco: Never Used  Substance and Sexual Activity  . Alcohol use: No  . Drug use: No  . Sexual activity: Not on file  Other  Topics Concern  . Not on file  Social History Narrative  . Not on file    Current Outpatient Medications on File Prior to Visit  Medication Sig Dispense Refill  . acetaminophen (TYLENOL) 650 MG CR tablet Take 650-1,300 mg by mouth every 8 (eight) hours as needed for pain.    [name] allopurinol (ZYLOPRIM) 100 MG tablet TAKE 1 TABLET EVERY DAY 90 tablet 1  . atorvastatin (LIPITOR) 20 MG tablet Take 1 tablet (20 mg total) by mouth daily. 90 tablet 3  . carvedilol (COREG) 12.5 MG tablet Take 0.5 tablets (6.25 mg total) by mouth 2 (two) times daily. (Patient taking differently: Take 6.25 mg by mouth daily. ) 90 tablet 3  . cholecalciferol (VITAMIN D) 1000 units tablet Take 1,000 Units by mouth daily.    . furosemide (LASIX) 20 MG tablet Take 20 mg by mouth daily.    [name] lisinopril (PRINIVIL,ZESTRIL) 20 MG tablet Take 0.5 tablets (10 mg total) by mouth daily. (Patient taking differently: Take 5 mg by mouth daily. ) 45 tablet 3  . metolazone (ZAROXOLYN) 2.5 MG tablet Take 1 tablet (2.5 mg total) by mouth daily. 90 tablet 3  . OVER THE COUNTER MEDICATION Take by mouth as needed (as directed). OTC Stomach medication    . polyethylene glycol (MIRALAX /  GLYCOLAX) packet Take 17 g by mouth daily as needed (constipation).    [name] spironolactone (ALDACTONE) 25 MG tablet TAKE 1 TABLET EVERY DAY 90 tablet 2  . tamsulosin (FLOMAX) 0.4 MG CAPS capsule TAKE 1 CAPSULE TWICE DAILY 180 capsule 2  . warfarin (COUMADIN) 5 MG tablet Take 1/2 to 1 tablet daily as directed by coumadin clinic 90 tablet 1   No current facility-administered medications on file prior to visit.     No Known Allergies  Family History  Problem Relation Age of Onset  . Heart disease Father   . Heart disease Mother   . Diabetes Mother   . Hypertension Brother   . Hypertension Brother   . Cancer Neg Hx     BP 122/78 (BP Location: Left Arm, Patient Position: Sitting, Cuff Size: Normal)   Pulse 87   Temp 97.8 F (36.6 C)   Wt 249 lb 6.4 oz  (113.1 kg)   SpO2 97%   BMI 37.92 kg/m    Review of Systems Denies sob, sore throat, and earache    Objective:   Physical Exam VITAL SIGNS:  See vs page GENERAL: no distress head: no deformity  eyes: no periorbital swelling, no proptosis  external nose and ears are normal  mouth: no lesion seen Both eac's and tm's are normal LUNGS:  Clear to auscultation, except for rales at the right base Pulses: foot pulses are absent bilaterally, (prob due to edema).   MSK: no deformity of the feet or ankles.  CV: 1+ bilat edema of the legs, and bilat vv's Skin: bilat rust discoloration of the legs. Heavy calluses. no ulcer on the feet.  feet and legs are of normal temp, but are cyanotic.  Neuro: sensation is intact to touch on the feet and ankles.   Lab Results  Component Value Date   HGBA1C 6.8 12/16/2017       Assessment & Plan:  Acute bronchitis, new Renal failure: due for recheck. Type 2 DM: well-controlled off medication.  We'll follow.   Patient Instructions  blood tests and a chest x-ray, are requested for you today.  We'll let you know about the results.   I have sent 3 prescriptions to your pharmacy: antibiotic, cough syrup, and inhaler.  I hope you feel better soon.  If you don't feel better by next week, please call back.  Please call sooner if you get worse.   Please come back for a follow-up appointment in 3 months.

## 2017-12-16 NOTE — Patient Instructions (Addendum)
blood tests and a chest x-ray, are requested for you today.  We'll let you know about the results.   I have sent 3 prescriptions to your pharmacy: antibiotic, cough syrup, and inhaler.  I hope you feel better soon.  If you don't feel better by next week, please call back.  Please call sooner if you get worse.   Please come back for a follow-up appointment in 3 months.

## 2017-12-17 LAB — CBC WITH DIFFERENTIAL/PLATELET
BASOS ABS: 0.1 10*3/uL (ref 0.0–0.1)
Basophils Relative: 1.1 % (ref 0.0–3.0)
EOS ABS: 0.2 10*3/uL (ref 0.0–0.7)
Eosinophils Relative: 3.2 % (ref 0.0–5.0)
HEMATOCRIT: 35 % — AB (ref 39.0–52.0)
HEMOGLOBIN: 11.4 g/dL — AB (ref 13.0–17.0)
LYMPHS PCT: 17 % (ref 12.0–46.0)
Lymphs Abs: 1.3 10*3/uL (ref 0.7–4.0)
MCHC: 32.6 g/dL (ref 30.0–36.0)
MCV: 103.5 fl — ABNORMAL HIGH (ref 78.0–100.0)
MONOS PCT: 10.4 % (ref 3.0–12.0)
Monocytes Absolute: 0.8 10*3/uL (ref 0.1–1.0)
Neutro Abs: 5.2 10*3/uL (ref 1.4–7.7)
Neutrophils Relative %: 68.3 % (ref 43.0–77.0)
Platelets: 144 10*3/uL — ABNORMAL LOW (ref 150.0–400.0)
RBC: 3.38 Mil/uL — AB (ref 4.22–5.81)
RDW: 15 % (ref 11.5–15.5)
WBC: 7.5 10*3/uL (ref 4.0–10.5)

## 2017-12-17 LAB — BASIC METABOLIC PANEL
BUN: 55 mg/dL — AB (ref 6–23)
CHLORIDE: 100 meq/L (ref 96–112)
CO2: 27 meq/L (ref 19–32)
CREATININE: 1.36 mg/dL (ref 0.40–1.50)
Calcium: 9.4 mg/dL (ref 8.4–10.5)
GFR: 52.7 mL/min — ABNORMAL LOW (ref >60.00)
GLUCOSE: 149 mg/dL — AB (ref 70–99)
POTASSIUM: 4.9 meq/L (ref 3.5–5.1)
Sodium: 134 mEq/L — ABNORMAL LOW (ref 135–145)

## 2017-12-18 ENCOUNTER — Ambulatory Visit
Admission: RE | Admit: 2017-12-18 | Discharge: 2017-12-18 | Disposition: A | Discharge status: Home / Self Care | Payer: Medicare HMO | Source: Ambulatory Visit | Attending: Endocrinology | Admitting: Endocrinology

## 2017-12-18 DIAGNOSIS — R059 Cough, unspecified: Secondary | ICD-10-CM

## 2017-12-18 DIAGNOSIS — R05 Cough: Secondary | ICD-10-CM | POA: Diagnosis not present

## 2017-12-19 NOTE — Progress Notes (Signed)
HPI: FU nonischemic cardiomyopathy as well as atrial fibrillation. Note, he had a catheterization in January 2006 that showed no coronary artery disease and an ejection fraction of 40%. He had a Myoview performed last on May 04, 2008, that showed an ejection fraction of 31%. There was felt to be a prior inferior infarct with mild peri-infarct ischemia. I did review this and felt there was a low risk and we have been treating medically. He does have permanent atrial fibrillation as well. Holter monitor in August of 2011 showed mildly reduced rate and we decreased his Coreg. In November of 2013 the patient had a biventricular ICD placed; downgraded to BIV pacemaker 8/17. Echo 7/17 showed EF 35-40, mild MR; mild LAE; mild RVE with reduced function; mild RAE. Since I last saw him, patient developed a URI recently. He was also noted to have lower blood pressure at recent office visit medications decreased. He denies dyspnea, chest pain, palpitations or syncope. Mild pedal edema.  Current Outpatient Medications  Medication Sig Dispense Refill  . acetaminophen (TYLENOL) 650 MG CR tablet Take 650-1,300 mg by mouth every 8 (eight) hours as needed for pain.    [name] allopurinol (ZYLOPRIM) 100 MG tablet TAKE 1 TABLET EVERY DAY 90 tablet 1  . atorvastatin (LIPITOR) 20 MG tablet Take 1 tablet (20 mg total) by mouth daily. 90 tablet 3  . carvedilol (COREG) 12.5 MG tablet Take 0.5 tablets (6.25 mg total) by mouth 2 (two) times daily. (Patient taking differently: Take 6.25 mg by mouth daily. ) 90 tablet 3  . cholecalciferol (VITAMIN D) 1000 units tablet Take 1,000 Units by mouth daily.    . Fluticasone-Salmeterol (ADVAIR) 100-50 MCG/DOSE AEPB Inhale 1 puff into the lungs 2 (two) times daily. 1 each 3  . furosemide (LASIX) 20 MG tablet Take 20 mg by mouth daily.    [name] lisinopril (PRINIVIL,ZESTRIL) 20 MG tablet Take 0.5 tablets (10 mg total) by mouth daily. (Patient taking differently: Take 5 mg by mouth daily. ) 45  tablet 3  . metolazone (ZAROXOLYN) 2.5 MG tablet Take 1 tablet (2.5 mg total) by mouth daily. 90 tablet 3  . OVER THE COUNTER MEDICATION Take by mouth as needed (as directed). OTC Stomach medication    . polyethylene glycol (MIRALAX / GLYCOLAX) packet Take 17 g by mouth daily as needed (constipation).    [name] spironolactone (ALDACTONE) 25 MG tablet TAKE 1 TABLET EVERY DAY 90 tablet 2  . tamsulosin (FLOMAX) 0.4 MG CAPS capsule TAKE 1 CAPSULE TWICE DAILY 180 capsule 2  . warfarin (COUMADIN) 5 MG tablet Take 1/2 to 1 tablet daily as directed by coumadin clinic 90 tablet 1   No current facility-administered medications for this visit.      Past Medical History:  Diagnosis Date  . Anemia   . Atrial fibrillation (HCC)   . BPH (benign prostatic hyperplasia)   . Bradycardia   . Cardiomyopathy   . CHF (congestive heart failure) (HCC)   . Diabetes mellitus type II   . GERD (gastroesophageal reflux disease)   . History of colonoscopy   . HTN (hypertension)   . Hyperlipidemia   . ICD (implantable cardiac defibrillator) in place 10/14/2012   biventricular  . OA (osteoarthritis)   . Obesity   . Peptic ulcer     Past Surgical History:  Procedure Laterality Date  . EP IMPLANTABLE DEVICE Left   . EP IMPLANTABLE DEVICE N/A 07/27/2016   Procedure: BIV Pacemaker downgrade;  Surgeon: Doylene Canning  Ladona Ridgel, MD;  Location: [facility] INVASIVE CV LAB;  Service: Cardiovascular;  Laterality: N/A;  . ESOPHAGOGASTRODUODENOSCOPY  06/24/2002  . L-spine  1965  . Lumbar L4-5 & S1  02/2000  . TOTAL KNEE ARTHROPLASTY  1997   right  . TOTAL KNEE ARTHROPLASTY Left 02/15/2015   Procedure: LEFT TOTAL KNEE ARTHROPLASTY;  Surgeon: Durene Romans, MD;  Location: [facility];  Service: Orthopedics;  Laterality: Left;    Social History   Socioeconomic History  . Marital status: Widowed    Spouse name: Not on file  . Number of children: Not on file  . Years of education: Not on file  . Highest education level: Not on file  Social Needs   . Financial resource strain: Not on file  . Food insecurity - worry: Not on file  . Food insecurity - inability: Not on file  . Transportation needs - medical: Not on file  . Transportation needs - non-medical: Not on file  Occupational History  . Occupation: Retired  Tobacco Use  . Smoking status: Never Smoker  . Smokeless tobacco: Never Used  Substance and Sexual Activity  . Alcohol use: No  . Drug use: No  . Sexual activity: Not on file  Other Topics Concern  . Not on file  Social History Narrative  . Not on file    Family History  Problem Relation Age of Onset  . Heart disease Father   . Heart disease Mother   . Diabetes Mother   . Hypertension Brother   . Hypertension Brother   . Cancer Neg Hx     ROS: recent URI but no hemoptysis, dysphasia, odynophagia, melena, hematochezia, dysuria, hematuria, rash, seizure activity, orthopnea, PND, claudication. Remaining systems are negative.  Physical Exam: Well-developed well-nourished in no acute distress.  Skin is warm and dry.  HEENT is normal.  Neck is supple.  Chest is clear to auscultation with normal expansion.  Cardiovascular exam is irregular Abdominal exam nontender or distended. No masses palpated. Extremities show trace to 1+ ankle edema; chronic skin changes. neuro grossly intact   A/P  1 chronic combined systolic/diastolic congestive heart failure-he appears to be doing well on his present diuretic regimen. Continue present dose of diuretics. He was somewhat prerenal recently on laboratories. Plan to repeat potassium and renal function. We will adjust diuretic regimen if needed.  2 permanent atrial fibrillation-plan to continue beta blocker for rate control. Continue Coumadin.   3 cardiomyopathy-continue ACE inhibitor and beta blocker.  4 hypertension-blood pressure is controlled. Continue present medications.  5 hyperlipidemia-continue statin.  6 prior ICD-managed by electrophysiology.  Olga Millers, MD

## 2017-12-25 ENCOUNTER — Encounter: Payer: Self-pay | Admitting: Cardiology

## 2017-12-25 ENCOUNTER — Ambulatory Visit (INDEPENDENT_AMBULATORY_CARE_PROVIDER_SITE_OTHER): Payer: Medicare HMO | Admitting: Pharmacist

## 2017-12-25 ENCOUNTER — Ambulatory Visit: Payer: Medicare HMO | Admitting: Cardiology

## 2017-12-25 VITALS — BP 111/62 | HR 93 | Ht 68.5 in | Wt 246.8 lb

## 2017-12-25 DIAGNOSIS — Z7901 Long term (current) use of anticoagulants: Secondary | ICD-10-CM

## 2017-12-25 DIAGNOSIS — I4821 Permanent atrial fibrillation: Secondary | ICD-10-CM

## 2017-12-25 DIAGNOSIS — I480 Paroxysmal atrial fibrillation: Secondary | ICD-10-CM

## 2017-12-25 DIAGNOSIS — I420 Dilated cardiomyopathy: Secondary | ICD-10-CM | POA: Diagnosis not present

## 2017-12-25 DIAGNOSIS — I4891 Unspecified atrial fibrillation: Secondary | ICD-10-CM | POA: Diagnosis not present

## 2017-12-25 DIAGNOSIS — Z5181 Encounter for therapeutic drug level monitoring: Secondary | ICD-10-CM

## 2017-12-25 DIAGNOSIS — I482 Chronic atrial fibrillation: Secondary | ICD-10-CM

## 2017-12-25 DIAGNOSIS — I5042 Chronic combined systolic (congestive) and diastolic (congestive) heart failure: Secondary | ICD-10-CM

## 2017-12-25 LAB — POCT INR: INR: 2.2

## 2017-12-25 NOTE — Patient Instructions (Signed)

## 2017-12-26 LAB — BASIC METABOLIC PANEL
BUN/Creatinine Ratio: 45 — ABNORMAL HIGH (ref 10–24)
BUN: 61 mg/dL — ABNORMAL HIGH (ref 8–27)
CALCIUM: 9.4 mg/dL (ref 8.6–10.2)
CO2: 20 mmol/L (ref 20–29)
CREATININE: 1.37 mg/dL — AB (ref 0.76–1.27)
Chloride: 99 mmol/L (ref 96–106)
GFR calc Af Amer: 54 mL/min/{1.73_m2} — ABNORMAL LOW (ref >59)
GFR, EST NON AFRICAN AMERICAN: 46 mL/min/{1.73_m2} — AB (ref >59)
Glucose: 143 mg/dL — ABNORMAL HIGH (ref 65–99)
Potassium: 4.9 mmol/L (ref 3.5–5.2)
Sodium: 137 mmol/L (ref 134–144)

## 2017-12-27 ENCOUNTER — Telehealth: Payer: Self-pay | Admitting: *Deleted

## 2017-12-27 DIAGNOSIS — N289 Disorder of kidney and ureter, unspecified: Secondary | ICD-10-CM

## 2017-12-27 MED ORDER — METOLAZONE 2.5 MG PO TABS: ORAL_TABLET | ORAL | 3 refills | Status: DC

## 2017-12-27 NOTE — Telephone Encounter (Signed)
Spoke with pt dtr, she voiced understanding and repeated medication change. Lab orders mailed to the pt

## 2017-12-27 NOTE — Telephone Encounter (Addendum)
-----   Message from Lewayne Bunting, MD sent at 12/26/2017  7:25 AM EST ----- Change metolazone to 2.5 mg po MWF; bmet 2 weeks Olga Millers   Left message for pt dtr to call

## 2017-12-30 ENCOUNTER — Other Ambulatory Visit: Payer: Self-pay | Admitting: Cardiology

## 2018-01-01 ENCOUNTER — Ambulatory Visit: Payer: Medicare HMO | Admitting: Podiatry

## 2018-01-07 ENCOUNTER — Other Ambulatory Visit: Payer: Self-pay | Admitting: Cardiology

## 2018-01-07 ENCOUNTER — Other Ambulatory Visit: Payer: Self-pay | Admitting: Endocrinology

## 2018-01-14 DIAGNOSIS — N289 Disorder of kidney and ureter, unspecified: Secondary | ICD-10-CM | POA: Diagnosis not present

## 2018-01-15 ENCOUNTER — Telehealth: Payer: Self-pay | Admitting: *Deleted

## 2018-01-15 ENCOUNTER — Ambulatory Visit: Payer: Medicare HMO | Admitting: Podiatry

## 2018-01-15 DIAGNOSIS — N289 Disorder of kidney and ureter, unspecified: Secondary | ICD-10-CM

## 2018-01-15 LAB — BASIC METABOLIC PANEL
BUN / CREAT RATIO: 43 — AB (ref 10–24)
BUN: 80 mg/dL (ref 8–27)
CO2: 16 mmol/L — ABNORMAL LOW (ref 20–29)
CREATININE: 1.85 mg/dL — AB (ref 0.76–1.27)
Calcium: 9.1 mg/dL (ref 8.6–10.2)
Chloride: 101 mmol/L (ref 96–106)
GFR calc Af Amer: 37 mL/min/{1.73_m2} — ABNORMAL LOW (ref >59)
GFR calc non Af Amer: 32 mL/min/{1.73_m2} — ABNORMAL LOW (ref >59)
GLUCOSE: 145 mg/dL — AB (ref 65–99)
POTASSIUM: 4.8 mmol/L (ref 3.5–5.2)
SODIUM: 138 mmol/L (ref 134–144)

## 2018-01-15 NOTE — Telephone Encounter (Addendum)
-----   Message from Lewayne Bunting, MD sent at 01/15/2018  7:38 AM EST ----- DC metolazone and spironolactone Bmet one week [name]   Left message for pt dtr to call

## 2018-01-16 ENCOUNTER — Other Ambulatory Visit: Payer: Self-pay | Admitting: *Deleted

## 2018-01-16 DIAGNOSIS — N289 Disorder of kidney and ureter, unspecified: Secondary | ICD-10-CM

## 2018-01-16 NOTE — Telephone Encounter (Signed)
Spoke with pt dtr, aware and repeated medication change. Lab orders mailed to the pt

## 2018-01-16 NOTE — Telephone Encounter (Signed)
Left message for pt to call.

## 2018-01-27 DIAGNOSIS — N289 Disorder of kidney and ureter, unspecified: Secondary | ICD-10-CM | POA: Diagnosis not present

## 2018-01-28 LAB — BASIC METABOLIC PANEL
BUN/Creatinine Ratio: 35 — ABNORMAL HIGH (ref 10–24)
BUN: 45 mg/dL — ABNORMAL HIGH (ref 8–27)
CALCIUM: 9.2 mg/dL (ref 8.6–10.2)
CHLORIDE: 100 mmol/L (ref 96–106)
CO2: 22 mmol/L (ref 20–29)
Creatinine, Ser: 1.3 mg/dL — ABNORMAL HIGH (ref 0.76–1.27)
GFR calc Af Amer: 57 mL/min/{1.73_m2} — ABNORMAL LOW (ref >59)
GFR calc non Af Amer: 49 mL/min/{1.73_m2} — ABNORMAL LOW (ref >59)
GLUCOSE: 145 mg/dL — AB (ref 65–99)
POTASSIUM: 4.8 mmol/L (ref 3.5–5.2)
Sodium: 139 mmol/L (ref 134–144)

## 2018-01-28 IMAGING — CR DG CHEST 2V
2 series · 2 of 2 positions shown · non-contrast
Comparison: 03/23/2014

CLINICAL DATA: CHF

EXAM:
CHEST  2 VIEW

[w chest pa]
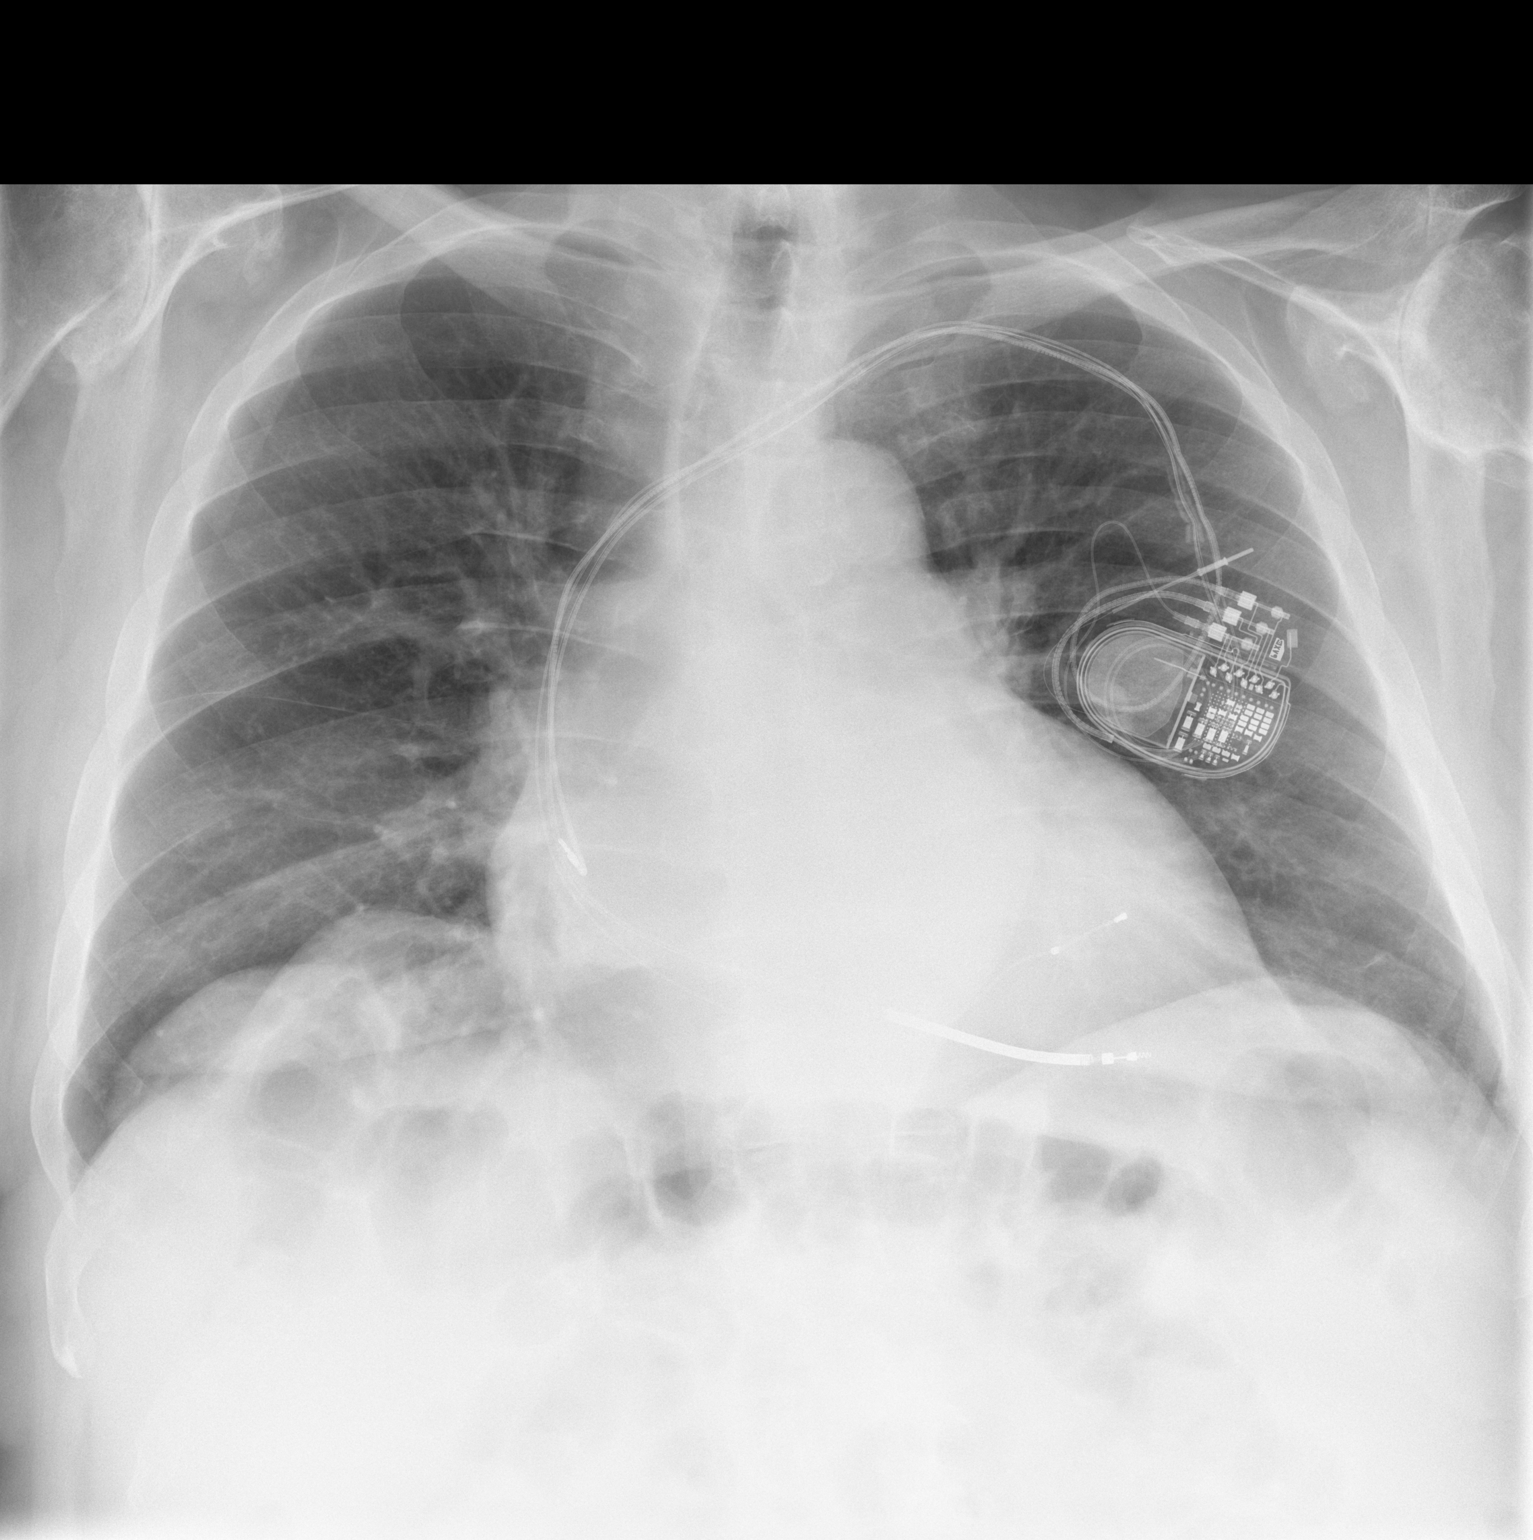

[w chest lat]
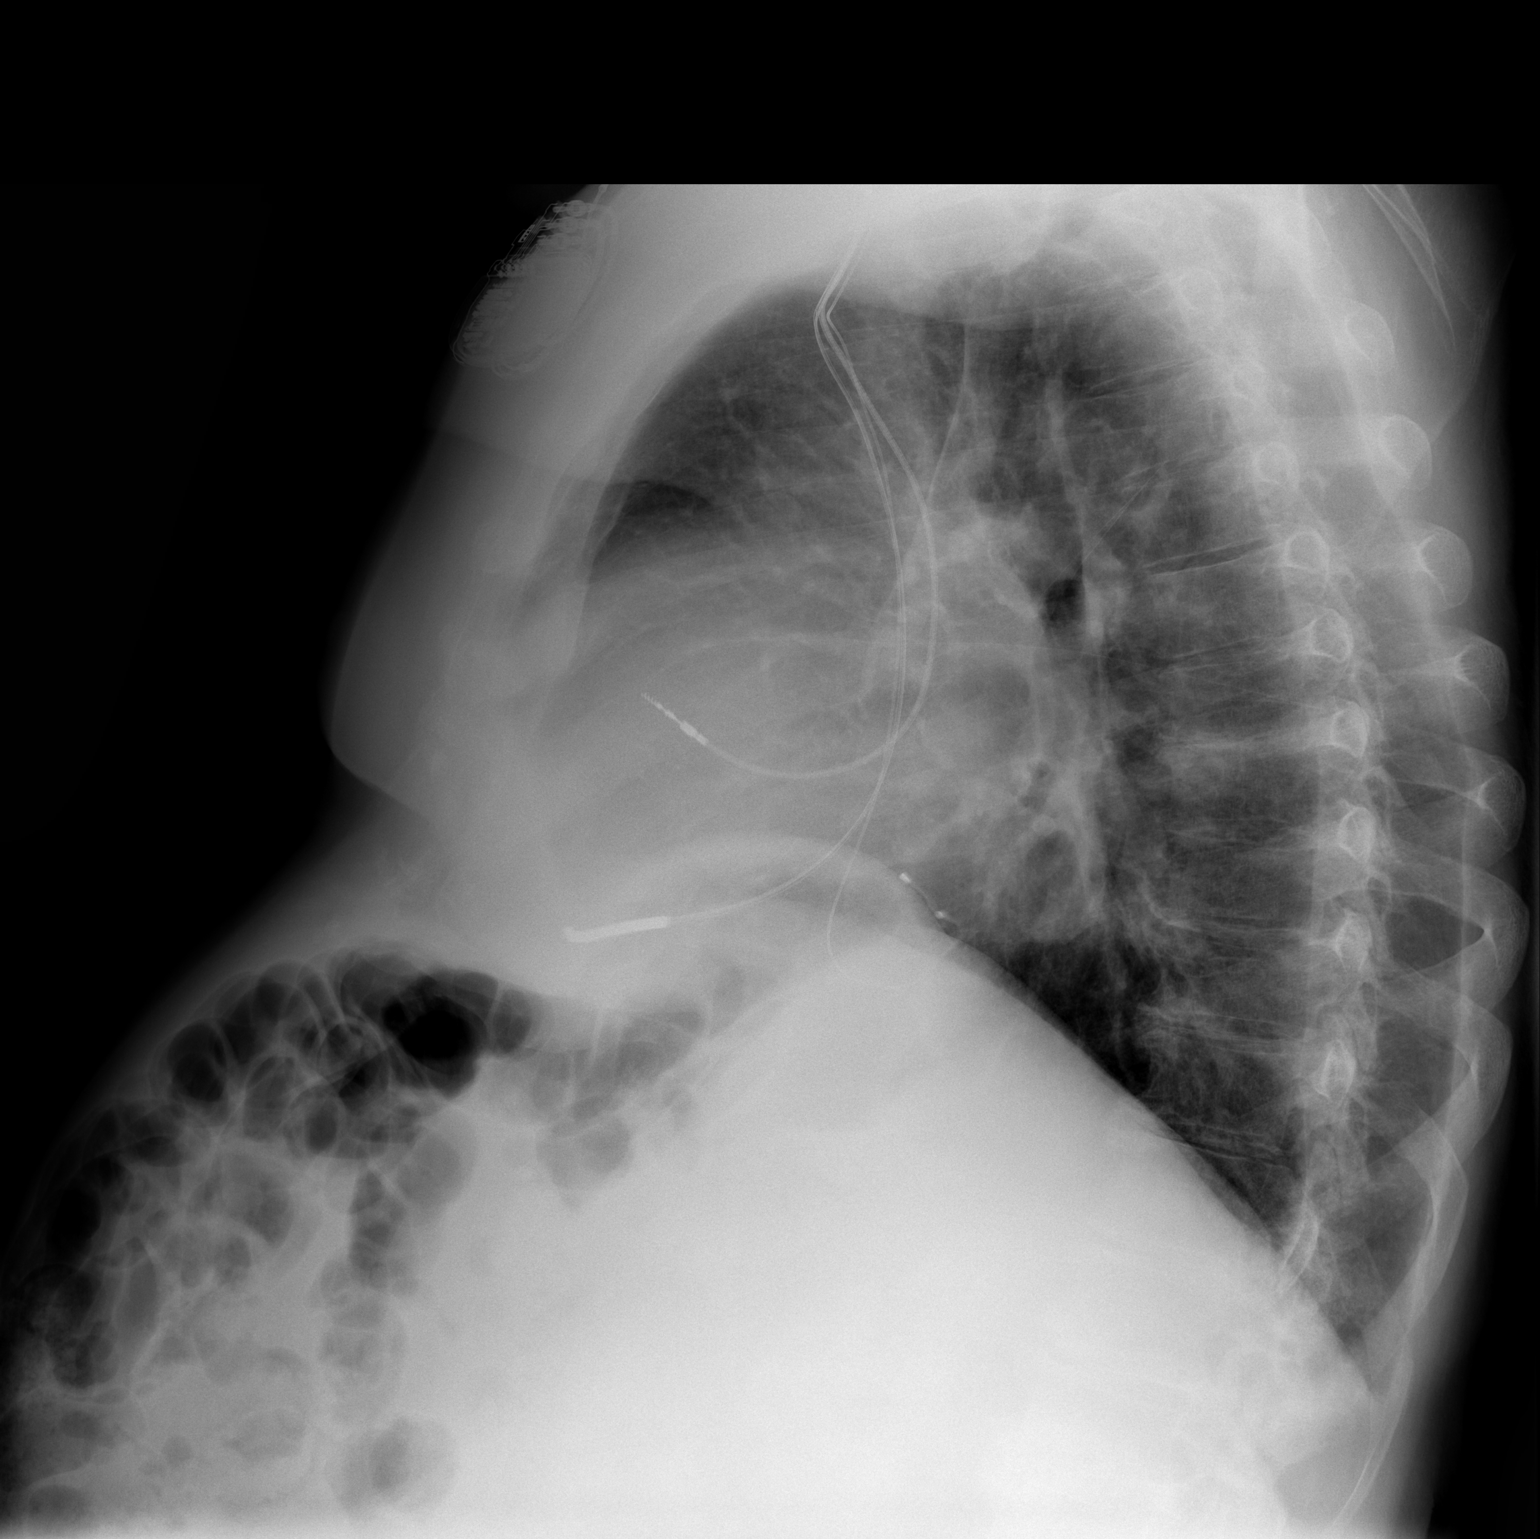

[2 of 2 positions shown; findings below may reference images not displayed]

FINDINGS: Cardiac enlargement. Negative for heart failure. No edema or
effusion. Negative for pneumonia

New pacemaker generator since the prior study. Three leads are
connected to the pacer. One lead is not connected to the pacer.
IMPRESSION: Cardiac enlargement.  No acute cardiopulmonary abnormality.

## 2018-02-05 ENCOUNTER — Telehealth: Payer: Self-pay | Admitting: Internal Medicine

## 2018-02-05 MED ORDER — METOLAZONE 2.5 MG PO TABS: ORAL_TABLET | ORAL | 3 refills | Status: DC

## 2018-02-05 NOTE — Telephone Encounter (Signed)
Left message for pt dtr,to call, he can take metolazone when his weight is p 5 lbs in one week. She will need to call if having to take the metolazone more than once a week.

## 2018-02-05 NOTE — Telephone Encounter (Signed)
Spoke to daughter, aware and verbalized understanding.  

## 2018-02-05 NOTE — Telephone Encounter (Signed)
Pt c/o swelling: STAT is pt has developed SOB within 24 hours  1) How much weight have you gained and in what time span? 10 pounds in a week  If swelling, where is the swelling located? Waist down  Are you currently taking a fluid pill? yes 2) Are you currently SOB? no  3) Do you have a log of your daily weights (if so, list)?  260  4) Have you gained 3 pounds in a day or 5 pounds in a week? 10 pounds in a week  5) Have you traveled recently? no

## 2018-02-05 NOTE — Telephone Encounter (Signed)
New message ° °Pt verbalized that she is returning call for RN °

## 2018-02-05 NOTE — Telephone Encounter (Signed)
Returned call to daughter (ok per DPR) who states patient has gained 10 pounds in 2-3 days.   States she spoke to nurse last week and 2 meds were d/c (sprironolactone and metolazone) and was instructed to weigh him daily.  States his weight was stable for the first couple of days then increased over the last 2-3 days by 10 lbs.   Denies increase in swelling or SOB.  Patient is taking furosemide daily.  States he is not urinating much, when he does urinate it is not much at all.     Per chart review-patient had repeat labs on 2/18-was instructed to continue metolazone 2.5 daily only as needed.  Patient has not taken this.    Daughter states she was instructed to call before giving any additional medication.    Advised I would route to nurse to clarify but seems that he may need to restart the metolazone as needed per Dr. Jens Som (lab result note).

## 2018-02-06 ENCOUNTER — Ambulatory Visit (INDEPENDENT_AMBULATORY_CARE_PROVIDER_SITE_OTHER): Payer: Medicare HMO | Admitting: Pharmacist

## 2018-02-06 DIAGNOSIS — Z5181 Encounter for therapeutic drug level monitoring: Secondary | ICD-10-CM | POA: Diagnosis not present

## 2018-02-06 DIAGNOSIS — I480 Paroxysmal atrial fibrillation: Secondary | ICD-10-CM | POA: Diagnosis not present

## 2018-02-06 LAB — POCT INR: INR: 2.8

## 2018-02-06 NOTE — Patient Instructions (Signed)
Description   Continue with 1 tablet daily except 1/2 tablet on Mondays and Thursdays.  Repeat INR 6 weeks.  Call us with any concerns or new medications # [phone].

## 2018-02-10 ENCOUNTER — Encounter: Payer: Self-pay | Admitting: Physician Assistant

## 2018-02-10 ENCOUNTER — Telehealth: Payer: Self-pay | Admitting: Cardiology

## 2018-02-10 ENCOUNTER — Ambulatory Visit: Payer: Medicare HMO | Admitting: Physician Assistant

## 2018-02-10 VITALS — BP 116/68 | HR 93 | Ht 68.5 in | Wt 256.0 lb

## 2018-02-10 DIAGNOSIS — E785 Hyperlipidemia, unspecified: Secondary | ICD-10-CM | POA: Diagnosis not present

## 2018-02-10 DIAGNOSIS — E119 Type 2 diabetes mellitus without complications: Secondary | ICD-10-CM | POA: Diagnosis not present

## 2018-02-10 DIAGNOSIS — I482 Chronic atrial fibrillation: Secondary | ICD-10-CM | POA: Diagnosis not present

## 2018-02-10 DIAGNOSIS — Z79899 Other long term (current) drug therapy: Secondary | ICD-10-CM

## 2018-02-10 DIAGNOSIS — I428 Other cardiomyopathies: Secondary | ICD-10-CM

## 2018-02-10 DIAGNOSIS — I5042 Chronic combined systolic (congestive) and diastolic (congestive) heart failure: Secondary | ICD-10-CM

## 2018-02-10 DIAGNOSIS — I10 Essential (primary) hypertension: Secondary | ICD-10-CM | POA: Diagnosis not present

## 2018-02-10 DIAGNOSIS — Z95810 Presence of automatic (implantable) cardiac defibrillator: Secondary | ICD-10-CM

## 2018-02-10 DIAGNOSIS — I4821 Permanent atrial fibrillation: Secondary | ICD-10-CM

## 2018-02-10 MED ORDER — POTASSIUM CHLORIDE CRYS ER 20 MEQ PO TBCR: EXTENDED_RELEASE_TABLET | ORAL | 2 refills | Status: DC

## 2018-02-10 MED ORDER — FUROSEMIDE 40 MG PO TABS: ORAL_TABLET | ORAL | 2 refills | Status: DC

## 2018-02-10 NOTE — Telephone Encounter (Signed)
New Message     Pt c/o swelling: STAT is pt has developed SOB within 24 hours  1) How much weight have you gained and in what time span?  10lbs in a few days   2) If swelling, where is the swelling located? His legs are weeping and blistered, his eyes are leaking water,  Water retention everywhere , not urinating properly   3) Are you currently taking a fluid pill? Once a week    4) Are you currently SOB? No- not able to get up and do anything  5) Do you have a log of your daily weights (if so, list)?  no  6) Have you gained 3 pounds in a day or 5 pounds in a week? She thinks he has gained 10lbs  7) Have you traveled recently? no

## 2018-02-10 NOTE — Progress Notes (Signed)
Cardiology Office Note    Date:  02/12/2018   ID:  [name], DOB 13-Aug-1929, MRN [id number]  PCP:  Romero Belling, MD  Cardiologist:  Dr. Jens Som EP: Dr. Ladona Ridgel    Chief Complaint  Patient presents with  . Follow-up    seen for Dr. Jens Som.   . Edema    History of Present Illness:  [name] is a 84 y.o. male with PMH of NICM s/p BiV ICD, chronic combined systolic and diastolic HF, permanent atrial fibrillation, bradycardia, HTN, HLD and DM II. He had a cardiac cath in 12/2004 that showed no CAD, EF 40%. Myoview on 05/04/2008 showed EF 31%, prior infarct with mild peri-infarct ischemia. This was reviewed by Dr. Jens Som and felt to be low risk. Holter in 07/2010 showed mildly reduced HR, coreg reduced. He had BiV ICD implanted in 10/2012, this was later downgraded to BiV PPM 07/2016. Echo in 06/2016 showed EF 35-40%, mild LAE.   Patient presents today for cardiology office visit.  He denies any significant chest discomfort, orthopnea or PND.  He does have increased swelling in the lower extremity recently and mild increase of shortness of breath.  Majority of the fluid seems to be in his lower extremity.  He is still in atrial fibrillation, heart rate is in the 90s.  I recommended to increase his diuretic and potassium supplement for 2 days prior to going back to a higher dose of 40 mg daily.  He will need basic metabolic panel today and also in 1 week.    Past Medical History:  Diagnosis Date  . Anemia   . Atrial fibrillation (HCC)   . BPH (benign prostatic hyperplasia)   . Bradycardia   . Cardiomyopathy   . CHF (congestive heart failure) (HCC)   . Diabetes mellitus type II   . GERD (gastroesophageal reflux disease)   . History of colonoscopy   . HTN (hypertension)   . Hyperlipidemia   . ICD (implantable cardiac defibrillator) in place 10/14/2012   biventricular  . OA (osteoarthritis)   . Obesity   . Peptic ulcer     Past Surgical History:  Procedure  Laterality Date  . EP IMPLANTABLE DEVICE Left   . EP IMPLANTABLE DEVICE N/A 07/27/2016   Procedure: BIV Pacemaker downgrade;  Surgeon: Marinus Maw, MD;  Location: [facility] INVASIVE CV LAB;  Service: Cardiovascular;  Laterality: N/A;  . ESOPHAGOGASTRODUODENOSCOPY  06/24/2002  . L-spine  1965  . Lumbar L4-5 & S1  02/2000  . TOTAL KNEE ARTHROPLASTY  1997   right  . TOTAL KNEE ARTHROPLASTY Left 02/15/2015   Procedure: LEFT TOTAL KNEE ARTHROPLASTY;  Surgeon: Durene Romans, MD;  Location: [facility];  Service: Orthopedics;  Laterality: Left;    Current Medications: Outpatient Medications Prior to Visit  Medication Sig Dispense Refill  . acetaminophen (TYLENOL) 650 MG CR tablet Take 650-1,300 mg by mouth every 8 (eight) hours as needed for pain.    [name] allopurinol (ZYLOPRIM) 100 MG tablet TAKE 1 TABLET EVERY DAY 90 tablet 1  . atorvastatin (LIPITOR) 20 MG tablet TAKE 1 TABLET EVERY DAY 90 tablet 3  . carvedilol (COREG) 12.5 MG tablet Take 0.5 tablets (6.25 mg total) by mouth 2 (two) times daily. (Patient taking differently: Take 6.25 mg by mouth daily. ) 90 tablet 3  . cholecalciferol (VITAMIN D) 1000 units tablet Take 1,000 Units by mouth daily.    . Fluticasone-Salmeterol (ADVAIR) 100-50 MCG/DOSE AEPB Inhale 1 puff into the lungs 2 (two) times  daily. 1 each 3  . lisinopril (PRINIVIL,ZESTRIL) 20 MG tablet TAKE 1 TABLET EVERY DAY 90 tablet 3  . metolazone (ZAROXOLYN) 2.5 MG tablet Take 1 tablet weekly as needed for weight gain of 5 lbs 30 tablet 3  . OVER THE COUNTER MEDICATION Take by mouth as needed (as directed). OTC Stomach medication    . polyethylene glycol (MIRALAX / GLYCOLAX) packet Take 17 g by mouth daily as needed (constipation).    . tamsulosin (FLOMAX) 0.4 MG CAPS capsule TAKE 1 CAPSULE TWICE DAILY 180 capsule 2  . warfarin (COUMADIN) 5 MG tablet TAKE 1/2 TO 1 TABLET DAILY AS DIRECTED BY COUMADIN CLINIC 90 tablet 1  . furosemide (LASIX) 20 MG tablet Take 20 mg by mouth daily.     No  facility-administered medications prior to visit.      Allergies:   Patient has no known allergies.   Social History   Socioeconomic History  . Marital status: Widowed    Spouse name: None  . Number of children: None  . Years of education: None  . Highest education level: None  Social Needs  . Financial resource strain: None  . Food insecurity - worry: None  . Food insecurity - inability: None  . Transportation needs - medical: None  . Transportation needs - non-medical: None  Occupational History  . Occupation: Retired  Tobacco Use  . Smoking status: Never Smoker  . Smokeless tobacco: Never Used  Substance and Sexual Activity  . Alcohol use: No  . Drug use: No  . Sexual activity: None  Other Topics Concern  . None  Social History Narrative  . None     Family History:  The patient's family history includes Diabetes in his mother; Heart disease in his father and mother; Hypertension in his brother and brother.   ROS:   Please see the history of present illness.    ROS All other systems reviewed and are negative.   PHYSICAL EXAM:   VS:  BP 116/68   Pulse 93   Ht 5' 8.5" (1.74 m)   Wt 256 lb (116.1 kg)   BMI 38.36 kg/m    GEN: Well nourished, well developed, in no acute distress  HEENT: normal  Neck: no JVD, carotid bruits, or masses Cardiac: RRR; no murmurs, rubs, or gallops. 2-3+ pitting edema  Respiratory:  clear to auscultation bilaterally, normal work of breathing GI: soft, nontender, nondistended, + BS MS: no deformity or atrophy  Skin: warm and dry, no rash Neuro:  Alert and Oriented x 3, Strength and sensation are intact Psych: euthymic mood, full affect  Wt Readings from Last 3 Encounters:  02/10/18 256 lb (116.1 kg)  12/25/17 246 lb 12.8 oz (111.9 kg)  12/16/17 249 lb 6.4 oz (113.1 kg)      Studies/Labs Reviewed:   EKG:  EKG is ordered today.  The ekg ordered today demonstrates ventricularly paced rhythm. Unchanged from previous EKG  Recent  Labs: 08/21/2017: Pro B Natriuretic peptide (BNP) 163.0 12/16/2017: Hemoglobin 11.4; Platelets 144.0 02/10/2018: BUN 36; Creatinine, Ser 1.30; Potassium 4.2; Sodium 139   Lipid Panel    Component Value Date/Time   CHOL 138 01/16/2017 1435   TRIG 64 01/16/2017 1435   HDL 58 01/16/2017 1435   CHOLHDL 2.4 01/16/2017 1435   VLDL 13 01/16/2017 1435   LDLCALC 67 01/16/2017 1435    Additional studies/ records that were reviewed today include:   Eho 07/06/2016 LV EF: 35% -   40%  Study Conclusions  -  Left ventricle: The cavity size was normal. Wall thickness was   normal. Systolic function was moderately reduced. The estimated   ejection fraction was in the range of 35% to 40%. - Mitral valve: There was mild regurgitation. - Left atrium: The atrium was mildly dilated. - Right ventricle: The cavity size was mildly dilated. Systolic   function was moderately reduced. - Right atrium: The atrium was mildly dilated. - Pulmonary arteries: PA peak pressure: 31 mm Hg (S).    ASSESSMENT:    1. Chronic combined systolic and diastolic heart failure (HCC)   2. Essential hypertension   3. Medication management   4. NICM (nonischemic cardiomyopathy) (HCC)   5. Biventricular ICD (implantable cardioverter-defibrillator) in place   6. Permanent atrial fibrillation (HCC)   7. Hyperlipidemia, unspecified hyperlipidemia type   8. Controlled type 2 diabetes mellitus without complication, without long-term current use of insulin (HCC)      PLAN:  In order of problems listed above:  1. Chronic combined systolic and diastolic heart failure: He has 2-3+ pitting edema in bilateral lower extremity, fortunately his lung is clear.  He is on 20 mg daily of Lasix.  I would increase his diuretic to 40 mg twice daily for 2 days before going back down to 40 mg daily.  He likely will need to have a higher dose of diuretic from this day forward.  The only reason I did not send him to the emergency room today is  because his lung is clear, however if I am unable to remove the fluid off of him by next week, I have very low threshold for recommendation of hospital admission.  2. NICM s/p BiV ICD: Followed by Dr. Ladona Ridgel, consider Optival reading on the next visit  3. Permanent atrial fibrillation: Heart rate controlled in the 90s.  On Coumadin  4. Hypertension: Blood pressure very well controlled  5. Hyperlipidemia: On Lipitor 20 mg daily.  Will need repeat fasting lipid panel by primary care provider  6. DM 2: Managed by primary care provider    Medication Adjustments/Labs and Tests Ordered: Current medicines are reviewed at length with the patient today.  Concerns regarding medicines are outlined above.  Medication changes, Labs and Tests ordered today are listed in the Patient Instructions below. Patient Instructions  Medication Instructions: INCREASE the Furosemide to 40 mg twice a day for 2 days, then take the Furosemide 40 mg daily. START the Potassium (Kdur) 20 meq twice daily for 2 days, then take Potassium 20 meq daily  If you need a refill on your cardiac medications before your next appointment, please call your pharmacy.   Labwork: Your provider would like for you to have the following labs today: BMET  Your physician recommends that you return for lab work in one week for a BMET    Follow-Up: Your physician wants you to follow-up next Tuesday with Azalee Course, PA in the morning only when Dr. Jens Som is here.    Thank you for choosing [facility] at [facility]!!         Signed, Azalee Course, [place]  02/12/2018 1:10 PM    [facility] [address] [place], [place], [place]  [address] Phone: [phone]; Fax: [phone]

## 2018-02-10 NOTE — Patient Instructions (Addendum)
Medication Instructions: INCREASE the Furosemide to 40 mg twice a day for 2 days, then take the Furosemide 40 mg daily. START the Potassium (Kdur) 20 meq twice daily for 2 days, then take Potassium 20 meq daily  If you need a refill on your cardiac medications before your next appointment, please call your pharmacy.   Labwork: Your provider would like for you to have the following labs today: BMET  Your physician recommends that you return for lab work in one week for a BMET    Follow-Up: Your physician wants you to follow-up next Tuesday with Azalee Course, PA in the morning only when Dr. Jens Som is here.    Thank you for choosing Heartcare at [facility]!!

## 2018-02-10 NOTE — Telephone Encounter (Signed)
Spoke to daughter.   States since last week patients weight has continued to increase.   States last week patient took PRN metolazone on Thursday, got a little better, but since Friday his weight has continued to increase and leg swelling has increased as well.  States he has blisters and they are weeping, weight today is 260.   States she is unable to get his shoes on.   Denies abdominal distension, denies increase in SOB.  States he is not very active anyway, therefore this is not really any change in activity for him.   Daughter states before medications were changed patient was doing well.     Appt made for today at 3 pm with Azalee Course PA.  Daughter aware and verbalized understanding.

## 2018-02-11 ENCOUNTER — Telehealth: Payer: Self-pay | Admitting: Internal Medicine

## 2018-02-11 ENCOUNTER — Ambulatory Visit: Payer: Medicare HMO | Admitting: Podiatry

## 2018-02-11 LAB — BASIC METABOLIC PANEL
BUN / CREAT RATIO: 28 — AB (ref 10–24)
BUN: 36 mg/dL — AB (ref 8–27)
CO2: 22 mmol/L (ref 20–29)
CREATININE: 1.3 mg/dL — AB (ref 0.76–1.27)
Calcium: 9.1 mg/dL (ref 8.6–10.2)
Chloride: 98 mmol/L (ref 96–106)
GFR, EST AFRICAN AMERICAN: 57 mL/min/{1.73_m2} — AB (ref >59)
GFR, EST NON AFRICAN AMERICAN: 49 mL/min/{1.73_m2} — AB (ref >59)
Glucose: 143 mg/dL — ABNORMAL HIGH (ref 65–99)
Potassium: 4.2 mmol/L (ref 3.5–5.2)
SODIUM: 139 mmol/L (ref 134–144)

## 2018-02-11 NOTE — Telephone Encounter (Signed)
Spoke with pts daughter and moved his appt out 6 wks from last appt. Even thought he will see Dr. Ladona Ridgel in 3 wks, daughter wants appt to be separated .

## 2018-02-11 NOTE — Progress Notes (Signed)
Kidney function and electrolyte stable.

## 2018-02-11 NOTE — Telephone Encounter (Signed)
New message    Patient calling to confirm coumadin appt needs to be on 3/19. Feels appt is too soon because 2/28 levels were good. Please advise

## 2018-02-12 ENCOUNTER — Encounter: Payer: Self-pay | Admitting: Physician Assistant

## 2018-02-17 ENCOUNTER — Other Ambulatory Visit: Payer: Self-pay | Admitting: *Deleted

## 2018-02-17 DIAGNOSIS — Z79899 Other long term (current) drug therapy: Secondary | ICD-10-CM

## 2018-02-17 DIAGNOSIS — I10 Essential (primary) hypertension: Secondary | ICD-10-CM | POA: Diagnosis not present

## 2018-02-18 ENCOUNTER — Ambulatory Visit: Payer: Medicare HMO | Admitting: Physician Assistant

## 2018-02-18 ENCOUNTER — Encounter: Payer: Self-pay | Admitting: Physician Assistant

## 2018-02-18 ENCOUNTER — Telehealth: Payer: Self-pay

## 2018-02-18 ENCOUNTER — Inpatient Hospital Stay (HOSPITAL_COMMUNITY)
Admission: AD | Admit: 2018-02-18 | Discharge: 2018-02-23 | DRG: 291 | Disposition: A | Discharge status: Home Health | Payer: Medicare HMO | Source: Ambulatory Visit | Attending: Internal Medicine | Admitting: Internal Medicine

## 2018-02-18 ENCOUNTER — Other Ambulatory Visit: Payer: Self-pay | Admitting: Physician Assistant

## 2018-02-18 VITALS — BP 148/60 | HR 93 | Ht 68.5 in | Wt 260.8 lb

## 2018-02-18 DIAGNOSIS — D539 Nutritional anemia, unspecified: Secondary | ICD-10-CM | POA: Diagnosis present

## 2018-02-18 DIAGNOSIS — Z950 Presence of cardiac pacemaker: Secondary | ICD-10-CM | POA: Diagnosis not present

## 2018-02-18 DIAGNOSIS — I482 Chronic atrial fibrillation: Secondary | ICD-10-CM | POA: Diagnosis present

## 2018-02-18 DIAGNOSIS — M1990 Unspecified osteoarthritis, unspecified site: Secondary | ICD-10-CM | POA: Diagnosis present

## 2018-02-18 DIAGNOSIS — I252 Old myocardial infarction: Secondary | ICD-10-CM

## 2018-02-18 DIAGNOSIS — I340 Nonrheumatic mitral (valve) insufficiency: Secondary | ICD-10-CM | POA: Diagnosis not present

## 2018-02-18 DIAGNOSIS — R008 Other abnormalities of heart beat: Secondary | ICD-10-CM | POA: Diagnosis present

## 2018-02-18 DIAGNOSIS — Z7951 Long term (current) use of inhaled steroids: Secondary | ICD-10-CM

## 2018-02-18 DIAGNOSIS — I4891 Unspecified atrial fibrillation: Secondary | ICD-10-CM | POA: Diagnosis not present

## 2018-02-18 DIAGNOSIS — E669 Obesity, unspecified: Secondary | ICD-10-CM | POA: Diagnosis present

## 2018-02-18 DIAGNOSIS — Z6836 Body mass index (BMI) 36.0-36.9, adult: Secondary | ICD-10-CM | POA: Diagnosis not present

## 2018-02-18 DIAGNOSIS — Z833 Family history of diabetes mellitus: Secondary | ICD-10-CM

## 2018-02-18 DIAGNOSIS — E1122 Type 2 diabetes mellitus with diabetic chronic kidney disease: Secondary | ICD-10-CM | POA: Diagnosis present

## 2018-02-18 DIAGNOSIS — K219 Gastro-esophageal reflux disease without esophagitis: Secondary | ICD-10-CM | POA: Diagnosis present

## 2018-02-18 DIAGNOSIS — R001 Bradycardia, unspecified: Secondary | ICD-10-CM | POA: Diagnosis present

## 2018-02-18 DIAGNOSIS — Z7901 Long term (current) use of anticoagulants: Secondary | ICD-10-CM

## 2018-02-18 DIAGNOSIS — I130 Hypertensive heart and chronic kidney disease with heart failure and stage 1 through stage 4 chronic kidney disease, or unspecified chronic kidney disease: Secondary | ICD-10-CM | POA: Diagnosis not present

## 2018-02-18 DIAGNOSIS — I499 Cardiac arrhythmia, unspecified: Secondary | ICD-10-CM

## 2018-02-18 DIAGNOSIS — I959 Hypotension, unspecified: Secondary | ICD-10-CM | POA: Diagnosis present

## 2018-02-18 DIAGNOSIS — I5023 Acute on chronic systolic (congestive) heart failure: Secondary | ICD-10-CM | POA: Diagnosis not present

## 2018-02-18 DIAGNOSIS — I428 Other cardiomyopathies: Secondary | ICD-10-CM | POA: Diagnosis not present

## 2018-02-18 DIAGNOSIS — I5082 Biventricular heart failure: Secondary | ICD-10-CM | POA: Diagnosis present

## 2018-02-18 DIAGNOSIS — I5021 Acute systolic (congestive) heart failure: Secondary | ICD-10-CM | POA: Diagnosis not present

## 2018-02-18 DIAGNOSIS — Z8711 Personal history of peptic ulcer disease: Secondary | ICD-10-CM

## 2018-02-18 DIAGNOSIS — D696 Thrombocytopenia, unspecified: Secondary | ICD-10-CM | POA: Diagnosis present

## 2018-02-18 DIAGNOSIS — I4510 Unspecified right bundle-branch block: Secondary | ICD-10-CM | POA: Diagnosis present

## 2018-02-18 DIAGNOSIS — Z4502 Encounter for adjustment and management of automatic implantable cardiac defibrillator: Secondary | ICD-10-CM

## 2018-02-18 DIAGNOSIS — Z95810 Presence of automatic (implantable) cardiac defibrillator: Secondary | ICD-10-CM

## 2018-02-18 DIAGNOSIS — I10 Essential (primary) hypertension: Secondary | ICD-10-CM | POA: Diagnosis present

## 2018-02-18 DIAGNOSIS — E876 Hypokalemia: Secondary | ICD-10-CM | POA: Diagnosis not present

## 2018-02-18 DIAGNOSIS — I5043 Acute on chronic combined systolic (congestive) and diastolic (congestive) heart failure: Secondary | ICD-10-CM

## 2018-02-18 DIAGNOSIS — N400 Enlarged prostate without lower urinary tract symptoms: Secondary | ICD-10-CM | POA: Diagnosis present

## 2018-02-18 DIAGNOSIS — N183 Chronic kidney disease, stage 3 (moderate): Secondary | ICD-10-CM | POA: Diagnosis not present

## 2018-02-18 DIAGNOSIS — D631 Anemia in chronic kidney disease: Secondary | ICD-10-CM | POA: Diagnosis present

## 2018-02-18 DIAGNOSIS — I509 Heart failure, unspecified: Secondary | ICD-10-CM

## 2018-02-18 DIAGNOSIS — I4821 Permanent atrial fibrillation: Secondary | ICD-10-CM

## 2018-02-18 DIAGNOSIS — Z96652 Presence of left artificial knee joint: Secondary | ICD-10-CM | POA: Diagnosis present

## 2018-02-18 DIAGNOSIS — I493 Ventricular premature depolarization: Secondary | ICD-10-CM | POA: Diagnosis not present

## 2018-02-18 DIAGNOSIS — E785 Hyperlipidemia, unspecified: Secondary | ICD-10-CM | POA: Diagnosis present

## 2018-02-18 DIAGNOSIS — Z79899 Other long term (current) drug therapy: Secondary | ICD-10-CM

## 2018-02-18 DIAGNOSIS — Z8249 Family history of ischemic heart disease and other diseases of the circulatory system: Secondary | ICD-10-CM

## 2018-02-18 DIAGNOSIS — I498 Other specified cardiac arrhythmias: Secondary | ICD-10-CM

## 2018-02-18 DIAGNOSIS — M7989 Other specified soft tissue disorders: Secondary | ICD-10-CM | POA: Diagnosis present

## 2018-02-18 DIAGNOSIS — E119 Type 2 diabetes mellitus without complications: Secondary | ICD-10-CM

## 2018-02-18 DIAGNOSIS — R0602 Shortness of breath: Secondary | ICD-10-CM | POA: Diagnosis not present

## 2018-02-18 LAB — CBC WITH DIFFERENTIAL/PLATELET
BASOS PCT: 1 %
Basophils Absolute: 0 10*3/uL (ref 0.0–0.1)
EOS ABS: 0.3 10*3/uL (ref 0.0–0.7)
Eosinophils Relative: 4 %
HCT: 32.6 % — ABNORMAL LOW (ref 39.0–52.0)
HEMOGLOBIN: 10.6 g/dL — AB (ref 13.0–17.0)
Lymphocytes Relative: 28 %
Lymphs Abs: 1.6 10*3/uL (ref 0.7–4.0)
MCH: 32.8 pg (ref 26.0–34.0)
MCHC: 32.5 g/dL (ref 30.0–36.0)
MCV: 100.9 fL — ABNORMAL HIGH (ref 78.0–100.0)
MONOS PCT: 8 %
Monocytes Absolute: 0.4 10*3/uL (ref 0.1–1.0)
NEUTROS PCT: 59 %
Neutro Abs: 3.3 10*3/uL (ref 1.7–7.7)
Platelets: 133 10*3/uL — ABNORMAL LOW (ref 150–400)
RBC: 3.23 MIL/uL — ABNORMAL LOW (ref 4.22–5.81)
RDW: 14.3 % (ref 11.5–15.5)
WBC: 5.7 10*3/uL (ref 4.0–10.5)

## 2018-02-18 LAB — BASIC METABOLIC PANEL
BUN/Creatinine Ratio: 30 — ABNORMAL HIGH (ref 10–24)
BUN: 34 mg/dL — ABNORMAL HIGH (ref 8–27)
CALCIUM: 9.1 mg/dL (ref 8.6–10.2)
CHLORIDE: 102 mmol/L (ref 96–106)
CO2: 24 mmol/L (ref 20–29)
Creatinine, Ser: 1.12 mg/dL (ref 0.76–1.27)
GFR calc Af Amer: 68 mL/min/{1.73_m2} (ref >59)
GFR, EST NON AFRICAN AMERICAN: 59 mL/min/{1.73_m2} — AB (ref >59)
GLUCOSE: 122 mg/dL — AB (ref 65–99)
POTASSIUM: 4.9 mmol/L (ref 3.5–5.2)
SODIUM: 140 mmol/L (ref 134–144)

## 2018-02-18 LAB — COMPREHENSIVE METABOLIC PANEL
ALBUMIN: 3.2 g/dL — AB (ref 3.5–5.0)
ALK PHOS: 78 U/L (ref 38–126)
ALT: 19 U/L (ref 17–63)
AST: 23 U/L (ref 15–41)
Anion gap: 10 (ref 5–15)
BUN: 36 mg/dL — ABNORMAL HIGH (ref 6–20)
CALCIUM: 8.7 mg/dL — AB (ref 8.9–10.3)
CO2: 25 mmol/L (ref 22–32)
Chloride: 102 mmol/L (ref 101–111)
Creatinine, Ser: 1.24 mg/dL (ref 0.61–1.24)
GFR calc Af Amer: 58 mL/min — ABNORMAL LOW (ref >60)
GFR calc non Af Amer: 50 mL/min — ABNORMAL LOW (ref >60)
GLUCOSE: 124 mg/dL — AB (ref 65–99)
POTASSIUM: 4.4 mmol/L (ref 3.5–5.1)
SODIUM: 137 mmol/L (ref 135–145)
Total Bilirubin: 0.6 mg/dL (ref 0.3–1.2)
Total Protein: 5.7 g/dL — ABNORMAL LOW (ref 6.5–8.1)

## 2018-02-18 LAB — PROTIME-INR
INR: 2.75
PROTHROMBIN TIME: 28.9 s — AB (ref 11.4–15.2)

## 2018-02-18 LAB — BRAIN NATRIURETIC PEPTIDE: B Natriuretic Peptide: 325.8 pg/mL — ABNORMAL HIGH (ref 0.0–100.0)

## 2018-02-18 LAB — GLUCOSE, CAPILLARY: GLUCOSE-CAPILLARY: 124 mg/dL — AB (ref 65–99)

## 2018-02-18 LAB — MAGNESIUM: Magnesium: 1.3 mg/dL — ABNORMAL LOW (ref 1.7–2.4)

## 2018-02-18 MED ORDER — ATORVASTATIN CALCIUM 20 MG PO TABS
20.0000 mg | ORAL_TABLET | Freq: Every day | ORAL | Status: DC
  Administered 2018-02-19 – 2018-02-23 (×5): 20 mg via ORAL
  Filled 2018-02-18 (×6): qty 1

## 2018-02-18 MED ORDER — WARFARIN SODIUM 2.5 MG PO TABS
2.5000 mg | ORAL_TABLET | Freq: Once | ORAL | Status: AC
  Administered 2018-02-18: 2.5 mg via ORAL
  Filled 2018-02-18: qty 1

## 2018-02-18 MED ORDER — VITAMIN D 1000 UNITS PO TABS
1000.0000 [IU] | ORAL_TABLET | Freq: Every day | ORAL | Status: DC
  Administered 2018-02-19 – 2018-02-23 (×5): 1000 [IU] via ORAL
  Filled 2018-02-18 (×6): qty 1

## 2018-02-18 MED ORDER — LISINOPRIL 20 MG PO TABS
20.0000 mg | ORAL_TABLET | Freq: Every day | ORAL | Status: DC
  Administered 2018-02-19: 20 mg via ORAL
  Filled 2018-02-18 (×2): qty 1

## 2018-02-18 MED ORDER — POTASSIUM CHLORIDE CRYS ER 10 MEQ PO TBCR
15.0000 meq | EXTENDED_RELEASE_TABLET | Freq: Every day | ORAL | Status: DC
  Administered 2018-02-19: 15 meq via ORAL
  Filled 2018-02-18: qty 2

## 2018-02-18 MED ORDER — FUROSEMIDE 10 MG/ML IJ SOLN
80.0000 mg | Freq: Two times a day (BID) | INTRAMUSCULAR | Status: DC
  Administered 2018-02-18 – 2018-02-21 (×6): 80 mg via INTRAVENOUS
  Filled 2018-02-18 (×6): qty 8

## 2018-02-18 MED ORDER — INSULIN ASPART 100 UNIT/ML ~~LOC~~ SOLN
0.0000 [IU] | Freq: Three times a day (TID) | SUBCUTANEOUS | Status: DC
  Administered 2018-02-19: 4 [IU] via SUBCUTANEOUS
  Administered 2018-02-19 (×2): 3 [IU] via SUBCUTANEOUS
  Administered 2018-02-20 – 2018-02-22 (×7): 4 [IU] via SUBCUTANEOUS
  Administered 2018-02-23: 3 [IU] via SUBCUTANEOUS

## 2018-02-18 MED ORDER — CARVEDILOL 6.25 MG PO TABS
6.2500 mg | ORAL_TABLET | Freq: Every day | ORAL | Status: DC
  Administered 2018-02-18 – 2018-02-19 (×2): 6.25 mg via ORAL
  Filled 2018-02-18 (×2): qty 1

## 2018-02-18 MED ORDER — INSULIN ASPART 100 UNIT/ML ~~LOC~~ SOLN: 0.0000 [IU] | Freq: Three times a day (TID) | SUBCUTANEOUS | Status: DC

## 2018-02-18 MED ORDER — POLYETHYLENE GLYCOL 3350 17 G PO PACK: 17.0000 g | PACK | Freq: Every day | ORAL | Status: DC | PRN

## 2018-02-18 MED ORDER — ACETAMINOPHEN 325 MG PO TABS
650.0000 mg | ORAL_TABLET | Freq: Three times a day (TID) | ORAL | Status: DC | PRN
  Administered 2018-02-20 – 2018-02-22 (×3): 650 mg via ORAL
  Filled 2018-02-18 (×3): qty 2

## 2018-02-18 MED ORDER — SODIUM CHLORIDE 0.9% FLUSH: 3.0000 mL | INTRAVENOUS | Status: DC | PRN

## 2018-02-18 MED ORDER — WARFARIN - PHARMACIST DOSING INPATIENT
Freq: Every day | Status: DC
  Administered 2018-02-22: 1

## 2018-02-18 MED ORDER — ONDANSETRON HCL 4 MG/2ML IJ SOLN: 4.0000 mg | Freq: Four times a day (QID) | INTRAMUSCULAR | Status: DC | PRN

## 2018-02-18 MED ORDER — MOMETASONE FURO-FORMOTEROL FUM 100-5 MCG/ACT IN AERO
2.0000 | INHALATION_SPRAY | Freq: Two times a day (BID) | RESPIRATORY_TRACT | Status: DC
  Administered 2018-02-19 – 2018-02-23 (×7): 2 via RESPIRATORY_TRACT
  Filled 2018-02-18: qty 8.8

## 2018-02-18 MED ORDER — SODIUM CHLORIDE 0.9% FLUSH
3.0000 mL | Freq: Two times a day (BID) | INTRAVENOUS | Status: DC
  Administered 2018-02-18 – 2018-02-23 (×10): 3 mL via INTRAVENOUS

## 2018-02-18 MED ORDER — TAMSULOSIN HCL 0.4 MG PO CAPS
0.4000 mg | ORAL_CAPSULE | Freq: Two times a day (BID) | ORAL | Status: DC
  Administered 2018-02-18 – 2018-02-23 (×10): 0.4 mg via ORAL
  Filled 2018-02-18 (×10): qty 1

## 2018-02-18 MED ORDER — ALLOPURINOL 100 MG PO TABS
100.0000 mg | ORAL_TABLET | Freq: Every day | ORAL | Status: DC
  Administered 2018-02-19 – 2018-02-23 (×5): 100 mg via ORAL
  Filled 2018-02-18 (×6): qty 1

## 2018-02-18 MED ORDER — SODIUM CHLORIDE 0.9 % IV SOLN: 250.0000 mL | INTRAVENOUS | Status: DC | PRN

## 2018-02-18 MED ORDER — FUROSEMIDE 10 MG/ML IJ SOLN: 80.0000 mg | Freq: Two times a day (BID) | INTRAMUSCULAR | Status: DC

## 2018-02-18 NOTE — Patient Instructions (Signed)
Medication Instructions:  Your physician recommends that you continue on your current medications as directed. Please refer to the Current Medication list given to you today.  Labwork: NONE   Testing/Procedures: NONE   Follow-Up: TO BE DETERMINED   Any Other Special Instructions Will Be Listed Below (If Applicable). If you need a refill on your cardiac medications before your next appointment, please call your pharmacy.   PATIENT BEING DIRECTLY ADMITTED TO HOSPITAL; HOSPITAL WILL CONTACT PATIENT WHEN A BED IS AVAILABLE; PATIENT AGREEABLE.

## 2018-02-18 NOTE — Progress Notes (Signed)
ANTICOAGULATION CONSULT NOTE - Initial Consult  Pharmacy Consult for warfarin Indication: atrial fibrillation  No Known Allergies  Patient Measurements: Height: 5' 8.5" (174 cm) Weight: 256 lb 1.6 oz (116.2 kg) IBW/kg (Calculated) : 69.55  Vital Signs: Temp: 97.8 F (36.6 C) (03/12 1952) Temp Source: Oral (03/12 1952) BP: 110/50 (03/12 1952) Pulse Rate: 94 (03/12 2221)  Labs: Recent Labs    02/17/18 1500 02/18/18 2128  HGB  --  10.6*  HCT  --  32.6*  PLT  --  133*  LABPROT  --  28.9*  INR  --  2.75  CREATININE 1.12 1.24    Estimated Creatinine Clearance: 52.4 mL/min (by C-G formula based on SCr of 1.24 mg/dL).   Medical History: Past Medical History:  Diagnosis Date  . Anemia   . Atrial fibrillation (HCC)   . BPH (benign prostatic hyperplasia)   . Bradycardia   . Cardiomyopathy   . CHF (congestive heart failure) (HCC)   . Diabetes mellitus type II   . GERD (gastroesophageal reflux disease)   . History of colonoscopy   . HTN (hypertension)   . Hyperlipidemia   . ICD (implantable cardiac defibrillator) in place 10/14/2012   biventricular  . OA (osteoarthritis)   . Obesity   . Peptic ulcer     Medications:  Medications Prior to Admission  Medication Sig Dispense Refill Last Dose  . acetaminophen (TYLENOL) 650 MG CR tablet Take 650-1,300 mg by mouth every 8 (eight) hours as needed for pain.   Taking  . allopurinol (ZYLOPRIM) 100 MG tablet TAKE 1 TABLET EVERY DAY 90 tablet 1 Taking  . atorvastatin (LIPITOR) 20 MG tablet TAKE 1 TABLET EVERY DAY 90 tablet 3 Taking  . carvedilol (COREG) 12.5 MG tablet Take 0.5 tablets (6.25 mg total) by mouth 2 (two) times daily. (Patient taking differently: Take 6.25 mg by mouth daily. ) 90 tablet 3 Taking  . cholecalciferol (VITAMIN D) 1000 units tablet Take 1,000 Units by mouth daily.   Taking  . Fluticasone-Salmeterol (ADVAIR) 100-50 MCG/DOSE AEPB Inhale 1 puff into the lungs 2 (two) times daily. 1 each 3 Taking  .  furosemide (LASIX) 40 MG tablet Take 40 mg twice daily for two days, then take 40 mg daily 35 tablet 2 Taking  . lisinopril (PRINIVIL,ZESTRIL) 20 MG tablet TAKE 1 TABLET EVERY DAY 90 tablet 3 Taking  . metolazone (ZAROXOLYN) 2.5 MG tablet Take 1 tablet weekly as needed for weight gain of 5 lbs 30 tablet 3 Taking  . OVER THE COUNTER MEDICATION Take by mouth as needed (as directed). OTC Stomach medication   Taking  . polyethylene glycol (MIRALAX / GLYCOLAX) packet Take 17 g by mouth daily as needed (constipation).   Taking  . potassium chloride SA (K-DUR,KLOR-CON) 20 MEQ tablet Take 20 meq twice daily for two days then take 20 meq daily. 35 tablet 2 Taking  . tamsulosin (FLOMAX) 0.4 MG CAPS capsule TAKE 1 CAPSULE TWICE DAILY 180 capsule 2 Taking  . warfarin (COUMADIN) 5 MG tablet TAKE 1/2 TO 1 TABLET DAILY AS DIRECTED BY COUMADIN CLINIC 90 tablet 1 Taking    Assessment: [name] is a 84 y.o. male with Afib on warfarin PTA, presents as a direct admit for CHF. INR is therapeutic at 2.75. No bleeding noted. Last anti-coag visit 2/28 with therapeutic INR on current dosing.  PTA warfarin dose: 2.5 mg every Monday/Thursday, 5 mg all other days   Goal of Therapy:  INR 2-3 Monitor platelets by anticoagulation protocol:  Yes   Plan:  Give warfarin 2.5 mg x 1, and then plan to continue warfarin 2.5 mg every Monday/Thursday, 5 mg all other days Monitor daily INR, CBC, clinical course, s/sx of bleed, PO intake, DDI   Thank you for allowing [place] to participate in this patients care.  Signe Colt, PharmD Clinical phone for 02/18/2018 from 3:30-10:30p: [phone] If after 10:30p, please call main pharmacy at: [phone] 02/18/2018 10:33 PM

## 2018-02-18 NOTE — Progress Notes (Signed)
Cardiology Office Note    Date:  02/18/2018   ID:  [name], DOB 04-30-1929, MRN [id number]  PCP:  Romero Belling, MD  Cardiologist:  Dr. Jens Som   Chief Complaint  Patient presents with  . Follow-up    1 week. Seen for Dr. Jens Som    History of Present Illness:  [name] is a 84 y.o. male with PMH of NICM s/p BiV ICD, chronic combined systolic and diastolic HF, permanent atrial fibrillation, bradycardia, HTN, HLD and DM II. He had a cardiac cath in 12/2004 that showed no CAD, EF 40%. Myoview on 05/04/2008 showed EF 31%, prior infarct with mild peri-infarct ischemia. This was reviewed by Dr. Jens Som and felt to be low risk. Holter in 07/2010 showed mildly reduced HR, coreg reduced. He had BiV ICD implanted in 10/2012, this was later downgraded to BiV PPM 07/2016. Echo in 06/2016 showed EF 35-40%, mild LAE.   I last saw the patient on 02/10/2018, he had significant lower extremity edema at the time after his metolazone and spironolactone were discontinued on 01/14/2018 after basic metabolic panel showed creatinine went up to 1.8.  Prior to that, he was on 20 mg daily of Lasix, increase the Lasix to 40 mg twice daily for 2 days before going back to 40 mg daily thereafter. One week basic metabolic panel shows stable renal function is stable.  Patient presents today along with his daughter for re-visit, his weight is actually increased by 4 pounds since last week and a total of 14 pounds since January.  He continued to have worsening lower extremity edema.  He did mention after his diuretic was initially increased, he did have increased urinary output, however it is not enough to remove the amount of fluid.  He denies any orthopnea or PND.  His lung is clear on physical exam.  At this point I am not confident we can remove his excess edema as outpatient.  I have discussed the case with DOD Dr. Chilton Si who agrees with the plan for direct admission and aggressive IV diuresis.  He  will need a repeat echocardiogram.  Once his lower extremity edema improved, will also obtain a venous Doppler as well.    Past Medical History:  Diagnosis Date  . Anemia   . Atrial fibrillation (HCC)   . BPH (benign prostatic hyperplasia)   . Bradycardia   . Cardiomyopathy   . CHF (congestive heart failure) (HCC)   . Diabetes mellitus type II   . GERD (gastroesophageal reflux disease)   . History of colonoscopy   . HTN (hypertension)   . Hyperlipidemia   . ICD (implantable cardiac defibrillator) in place 10/14/2012   biventricular  . OA (osteoarthritis)   . Obesity   . Peptic ulcer     Past Surgical History:  Procedure Laterality Date  . EP IMPLANTABLE DEVICE Left   . EP IMPLANTABLE DEVICE N/A 07/27/2016   Procedure: BIV Pacemaker downgrade;  Surgeon: Marinus Maw, MD;  Location: [facility] INVASIVE CV LAB;  Service: Cardiovascular;  Laterality: N/A;  . ESOPHAGOGASTRODUODENOSCOPY  06/24/2002  . L-spine  1965  . Lumbar L4-5 & S1  02/2000  . TOTAL KNEE ARTHROPLASTY  1997   right  . TOTAL KNEE ARTHROPLASTY Left 02/15/2015   Procedure: LEFT TOTAL KNEE ARTHROPLASTY;  Surgeon: Durene Romans, MD;  Location: [facility] ORS;  Service: Orthopedics;  Laterality: Left;    Current Medications: Outpatient Medications Prior to Visit  Medication Sig Dispense Refill  . acetaminophen (  TYLENOL) 650 MG CR tablet Take 650-1,300 mg by mouth every 8 (eight) hours as needed for pain.    [name] allopurinol (ZYLOPRIM) 100 MG tablet TAKE 1 TABLET EVERY DAY 90 tablet 1  . atorvastatin (LIPITOR) 20 MG tablet TAKE 1 TABLET EVERY DAY 90 tablet 3  . carvedilol (COREG) 12.5 MG tablet Take 0.5 tablets (6.25 mg total) by mouth 2 (two) times daily. (Patient taking differently: Take 6.25 mg by mouth daily. ) 90 tablet 3  . cholecalciferol (VITAMIN D) 1000 units tablet Take 1,000 Units by mouth daily.    . Fluticasone-Salmeterol (ADVAIR) 100-50 MCG/DOSE AEPB Inhale 1 puff into the lungs 2 (two) times daily. 1 each 3  . furosemide  (LASIX) 40 MG tablet Take 40 mg twice daily for two days, then take 40 mg daily 35 tablet 2  . lisinopril (PRINIVIL,ZESTRIL) 20 MG tablet TAKE 1 TABLET EVERY DAY 90 tablet 3  . metolazone (ZAROXOLYN) 2.5 MG tablet Take 1 tablet weekly as needed for weight gain of 5 lbs 30 tablet 3  . OVER THE COUNTER MEDICATION Take by mouth as needed (as directed). OTC Stomach medication    . polyethylene glycol (MIRALAX / GLYCOLAX) packet Take 17 g by mouth daily as needed (constipation).    . potassium chloride SA (K-DUR,KLOR-CON) 20 MEQ tablet Take 20 meq twice daily for two days then take 20 meq daily. 35 tablet 2  . tamsulosin (FLOMAX) 0.4 MG CAPS capsule TAKE 1 CAPSULE TWICE DAILY 180 capsule 2  . warfarin (COUMADIN) 5 MG tablet TAKE 1/2 TO 1 TABLET DAILY AS DIRECTED BY COUMADIN CLINIC 90 tablet 1   No facility-administered medications prior to visit.      Allergies:   Patient has no known allergies.   Social History   Socioeconomic History  . Marital status: Widowed    Spouse name: None  . Number of children: None  . Years of education: None  . Highest education level: None  Social Needs  . Financial resource strain: None  . Food insecurity - worry: None  . Food insecurity - inability: None  . Transportation needs - medical: None  . Transportation needs - non-medical: None  Occupational History  . Occupation: Retired  Tobacco Use  . Smoking status: Never Smoker  . Smokeless tobacco: Never Used  Substance and Sexual Activity  . Alcohol use: No  . Drug use: No  . Sexual activity: None  Other Topics Concern  . None  Social History Narrative  . None     Family History:  The patient's family history includes Diabetes in his mother; Heart disease in his father and mother; Hypertension in his brother and brother.   ROS:   Please see the history of present illness.    ROS All other systems reviewed and are negative.   PHYSICAL EXAM:   VS:  BP (!) 148/60   Pulse 93   Ht 5' 8.5"  (1.74 m)   Wt 260 lb 12.8 oz (118.3 kg)   BMI 39.08 kg/m    GEN: Well nourished, well developed, in no acute distress  HEENT: normal  Neck: no JVD, carotid bruits, or masses Cardiac: Irregular; no murmurs, rubs, or gallops. 3+ pitting edema  Respiratory:  clear to auscultation bilaterally, normal work of breathing GI: soft, nontender, nondistended, + BS MS: no deformity or atrophy  Skin: warm and dry, no rash Neuro:  Alert and Oriented x 3, Strength and sensation are intact Psych: euthymic mood, full affect  Wt Readings  from Last 3 Encounters:  02/18/18 260 lb 12.8 oz (118.3 kg)  02/10/18 256 lb (116.1 kg)  12/25/17 246 lb 12.8 oz (111.9 kg)      Studies/Labs Reviewed:   EKG:  EKG is not ordered today.    Recent Labs: 08/21/2017: Pro B Natriuretic peptide (BNP) 163.0 12/16/2017: Hemoglobin 11.4; Platelets 144.0 02/17/2018: BUN 34; Creatinine, Ser 1.12; Potassium 4.9; Sodium 140   Lipid Panel    Component Value Date/Time   CHOL 138 01/16/2017 1435   TRIG 64 01/16/2017 1435   HDL 58 01/16/2017 1435   CHOLHDL 2.4 01/16/2017 1435   VLDL 13 01/16/2017 1435   LDLCALC 67 01/16/2017 1435    Additional studies/ records that were reviewed today include:   Eho 07/06/2016 LV EF: 35% - 40%  Study Conclusions  - Left ventricle: The cavity size was normal. Wall thickness was normal. Systolic function was moderately reduced. The estimated ejection fraction was in the range of 35% to 40%. - Mitral valve: There was mild regurgitation. - Left atrium: The atrium was mildly dilated. - Right ventricle: The cavity size was mildly dilated. Systolic function was moderately reduced. - Right atrium: The atrium was mildly dilated. - Pulmonary arteries: PA peak pressure: 31 mm Hg (S).   ASSESSMENT:    1. Acute on chronic combined systolic and diastolic CHF (congestive heart failure) (HCC)   2. NICM (nonischemic cardiomyopathy) (HCC)   3. ICD (implantable  cardioverter-defibrillator) in place   4. Permanent atrial fibrillation (HCC)   5. Essential hypertension   6. Hyperlipidemia, unspecified hyperlipidemia type   7. Controlled type 2 diabetes mellitus without complication, without long-term current use of insulin (HCC)      PLAN:  In order of problems listed above:  1. Acute on chronic combined systolic and diastolic heart failure  -Ever since his metolazone and spironolactone were discontinued on 01/14/2018, he has been having significant lower extremity edema.  -Despite the recent increase of Lasix to 40 mg twice daily for 2 weeks before going down to 40 mg daily thereafter, he is lower extremity edema has now improved.  Fortunately, he does not have any pulmonary edema.  -Patient was seen along with DOD Dr. Chilton Si, we plan to admit the patient for IV diuresis.  We will also obtain echocardiogram.    -Once he is fully diuresed, will plan for lower extremity venous Doppler with reflux to assess the degree of venous insufficiency.  2. NICM s/p BiV ICD: Followed by Dr. Ladona Ridgel, consider Optival readings in the hospital  3. Permanent atrial fibrillation on Coumadin: on Coumadin, continue carvedilol, rate controlled  4. Hypertension: Blood pressure mildly elevated today.  5. Hyperlipidemia: On Lipitor 20 mg daily.  Will defer lipid panel to primary care provider  6. DM 2: Managed by primary care provider.    Medication Adjustments/Labs and Tests Ordered: Current medicines are reviewed at length with the patient today.  Concerns regarding medicines are outlined above.  Medication changes, Labs and Tests ordered today are listed in the Patient Instructions below. Patient Instructions  Medication Instructions:  Your physician recommends that you continue on your current medications as directed. Please refer to the Current Medication list given to you today.  Labwork: NONE   Testing/Procedures: NONE   Follow-Up: TO BE  DETERMINED   Any Other Special Instructions Will Be Listed Below (If Applicable). If you need a refill on your cardiac medications before your next appointment, please call your pharmacy.   PATIENT BEING DIRECTLY ADMITTED TO  HOSPITAL; HOSPITAL WILL CONTACT PATIENT WHEN A BED IS AVAILABLE; PATIENT AGREEABLE.    Ramond Dial, [place]  02/18/2018 1:36 PM    [facility] [address] [place], [place], [place]  [address] Phone: [phone]; Fax: [phone]

## 2018-02-18 NOTE — Telephone Encounter (Signed)
Patients daughter, Deborah-per dpr, called wanting to know why they have not heard from the hospital about their bed. I called hospital and was told there is a lot of people waiting and they could not give me a timeframe but they will call when the bed is available. Patients daughter was directly notified and voiced understanding.  Per [name] he wants patient to take a extra 40mg  of Lasix right now; daughter voiced understanding.

## 2018-02-18 NOTE — H&P (Signed)
CARDIOLOGY HISTORY AND PHYSICAL     Date: 02/18/2018 Admitting Physician: Chilton Si, MD  Chief Complaint:  Lower extremity swelling ____________________________________________________________________ History of Present Illness:  [name] is a 84 y.o. old male with medical history noted below presents as a direct admit for CHF.  The patient was Seen in Dr Ludwig Clarks office earlier today with complaints of lower extremity swelling and weight gain.  He states this started about a month ago after his spironolactone and metolazone were stopped.  His lower extremity swelling has been progressing since then.  He denies shortness of breath unless he is exerting himself.  This is currently at his baseline.  He was previously seen in clinic on March 4 with complaints of swelling and on today's exam he was up 4 lbs.  He denies chest pain, dyspnea, lightheadedess or dizziness.    Review of Systems:   Review of Systems:  GEN: no fever, chills, nausea, vomiting, weight change  HEENT: no vision or hearing changes  PULM: no coughing, SOB  CV: no chest pain, palpitations, PND, orthopnea  GI: no abdominal pain  GU: no dysuria  EXT: +swelling  SKIN: no rashes  NEURO: no numbness or tingling  HEME: no bleeding or bruising  GYN: none  --12 point review systems- otherwise negative.  All other systems reviewed and are negative  Past Medical History:  Diagnosis Date  . Anemia   . Atrial fibrillation (HCC)   . BPH (benign prostatic hyperplasia)   . Bradycardia   . Cardiomyopathy   . CHF (congestive heart failure) (HCC)   . Diabetes mellitus type II   . GERD (gastroesophageal reflux disease)   . History of colonoscopy   . HTN (hypertension)   . Hyperlipidemia   . ICD (implantable cardiac defibrillator) in place 10/14/2012   biventricular  . OA (osteoarthritis)   . Obesity   . Peptic ulcer     Past Surgical History:  Procedure Laterality Date  . EP IMPLANTABLE DEVICE Left    . EP IMPLANTABLE DEVICE N/A 07/27/2016   Procedure: BIV Pacemaker downgrade;  Surgeon: Marinus Maw, MD;  Location: [facility] INVASIVE CV LAB;  Service: Cardiovascular;  Laterality: N/A;  . ESOPHAGOGASTRODUODENOSCOPY  06/24/2002  . L-spine  1965  . Lumbar L4-5 & S1  02/2000  . TOTAL KNEE ARTHROPLASTY  1997   right  . TOTAL KNEE ARTHROPLASTY Left 02/15/2015   Procedure: LEFT TOTAL KNEE ARTHROPLASTY;  Surgeon: Durene Romans, MD;  Location: [facility];  Service: Orthopedics;  Laterality: Left;    Social History   Socioeconomic History  . Marital status: Widowed    Spouse name: Not on file  . Number of children: Not on file  . Years of education: Not on file  . Highest education level: Not on file  Social Needs  . Financial resource strain: Not on file  . Food insecurity - worry: Not on file  . Food insecurity - inability: Not on file  . Transportation needs - medical: Not on file  . Transportation needs - non-medical: Not on file  Occupational History  . Occupation: Retired  Tobacco Use  . Smoking status: Never Smoker  . Smokeless tobacco: Never Used  Substance and Sexual Activity  . Alcohol use: No  . Drug use: No  . Sexual activity: Not on file  Other Topics Concern  . Not on file  Social History Narrative  . Not on file    Family History  Problem Relation Age of Onset  .  Heart disease Father   . Heart disease Mother   . Diabetes Mother   . Hypertension Brother   . Hypertension Brother   . Cancer Neg Hx     Past Cardiovascular History:  +CAD +MI +CHF - No documented h/o PVD - No documented h/o AAA - No documented h/o valvular heart disease - No documented h/o CVA - No documented h/o Arrhythmias +A-fib  - No documented h/o congenital heart disease - No documented h/o CABG - No documented h/o PCI - No documented h/o cardiac devices (Pacer/ICD/CRT) - No documented h/o cardiac surgery       Most recent stress test:  None  Most recent echocardiography:   Eho  07/06/2016 LV EF: 35% - 40%  Study Conclusions  - Left ventricle: The cavity size was normal. Wall thickness was normal. Systolic function was moderately reduced. The estimated ejection fraction was in the range of 35% to 40%. - Mitral valve: There was mild regurgitation. - Left atrium: The atrium was mildly dilated. - Right ventricle: The cavity size was mildly dilated. Systolic function was moderately reduced. - Right atrium: The atrium was mildly dilated. - Pulmonary arteries: PA peak pressure: 31 mm Hg (S).   Most recent left heart catheterization:  None  CABG:  Date/ Physician: None  Device history:  None  Prior to Admission medications   Medication Sig Start Date End Date Taking? Authorizing Provider  acetaminophen (TYLENOL) 650 MG CR tablet Take 650-1,300 mg by mouth every 8 (eight) hours as needed for pain.    [provider]  allopurinol (ZYLOPRIM) 100 MG tablet TAKE 1 TABLET EVERY DAY 12/14/17   Romero Belling, MD  atorvastatin (LIPITOR) 20 MG tablet TAKE 1 TABLET EVERY DAY 01/08/18   Lewayne Bunting, MD  carvedilol (COREG) 12.5 MG tablet Take 0.5 tablets (6.25 mg total) by mouth 2 (two) times daily. Patient taking differently: Take 6.25 mg by mouth daily.  11/26/17 02/24/18  Marinus Maw, MD  cholecalciferol (VITAMIN D) 1000 units tablet Take 1,000 Units by mouth daily.    [provider]  Fluticasone-Salmeterol (ADVAIR) 100-50 MCG/DOSE AEPB Inhale 1 puff into the lungs 2 (two) times daily. 12/16/17   Romero Belling, MD  furosemide (LASIX) 40 MG tablet Take 40 mg twice daily for two days, then take 40 mg daily 02/10/18   Azalee Course, PA  lisinopril (PRINIVIL,ZESTRIL) 20 MG tablet TAKE 1 TABLET EVERY DAY 01/08/18   Lewayne Bunting, MD  metolazone (ZAROXOLYN) 2.5 MG tablet Take 1 tablet weekly as needed for weight gain of 5 lbs 02/05/18   Lewayne Bunting, MD  OVER THE COUNTER MEDICATION Take by mouth as needed (as directed). OTC Stomach medication     [provider]  polyethylene glycol (MIRALAX / GLYCOLAX) packet Take 17 g by mouth daily as needed (constipation).    [provider]  potassium chloride SA (K-DUR,KLOR-CON) 20 MEQ tablet Take 20 meq twice daily for two days then take 20 meq daily. 02/10/18   Azalee Course, PA  tamsulosin (FLOMAX) 0.4 MG CAPS capsule TAKE 1 CAPSULE TWICE DAILY 10/02/17   Romero Belling, MD  warfarin (COUMADIN) 5 MG tablet TAKE 1/2 TO 1 TABLET DAILY AS DIRECTED BY COUMADIN CLINIC 12/31/17   Lewayne Bunting, MD    No Known Allergies  Social History:   Social History   Tobacco Use  . Smoking status: Never Smoker  . Smokeless tobacco: Never Used  Substance Use Topics  . Alcohol use: No  .  Drug use: No    Family History  Problem Relation Age of Onset  . Heart disease Father   . Heart disease Mother   . Diabetes Mother   . Hypertension Brother   . Hypertension Brother   . Cancer Neg Hx     Physical Examination: Blood pressure (!) 110/50, pulse (!) 46, temperature 97.8 F (36.6 C), temperature source Oral, resp. rate 20, height 5' 8.5" (1.74 m), weight 116.2 kg (256 lb 1.6 oz), SpO2 97 %. General:  AAOX 4.  NAD.  NRD.  HENT: Normocephalic. Atraumatic.  No acute abnom. EYES: PERRL EOMI  Neck: Supple.  +JVD.  No bruits. Cardiovascular:  Irregular.  Nl S1. Nl S2. No S3. No S4. Nl PMI. No r/m/g. Pulmonary/Chest: CTA B. No rales. No wheezing.  Abdomen: Soft, NT, no masses, no organomegaly. Neuro: CN intact, no motor/sensory deficit.  Ext: lower extremities are mottled and weeping from swelling.  2+ pitting edema.  SKIN- multiple fluid filled blisters on the lower extremity  No intake or output data in the 24 hours ending 02/18/18 2057  Troponin (Point of Care Test) No results for input(s): TROPIPOC in the last 72 hours. ____________________________________________________________________ Assessment/Plan  Acute on chronic combined systolic and diastolic CHF   Assessment:   Significantly volume overloaded on exam in the lower extremities.  Lungs are clear and he denies shortness of breath.   Plan  -  Continue coreg at 6.25 qD  -  Lasix 80 mg IV BID  -  Hold off on metolazone for now  -  Continue lisinopril  -  ECHO  -  Device interrogation tomorrow  Afib   Assessment:  Rate controlled.   Plan  -  Pharmacy consult for coumadin management   Thank you for consulting cardiology.    Electronically signed by Nada Maclachlan 02/18/2018 Link Snuffer, MD, PhD Cardiology

## 2018-02-19 ENCOUNTER — Inpatient Hospital Stay (HOSPITAL_COMMUNITY): Payer: Medicare HMO

## 2018-02-19 ENCOUNTER — Other Ambulatory Visit: Payer: Self-pay

## 2018-02-19 DIAGNOSIS — I482 Chronic atrial fibrillation: Secondary | ICD-10-CM

## 2018-02-19 DIAGNOSIS — I340 Nonrheumatic mitral (valve) insufficiency: Secondary | ICD-10-CM

## 2018-02-19 DIAGNOSIS — I5023 Acute on chronic systolic (congestive) heart failure: Secondary | ICD-10-CM

## 2018-02-19 DIAGNOSIS — I428 Other cardiomyopathies: Secondary | ICD-10-CM

## 2018-02-19 DIAGNOSIS — I493 Ventricular premature depolarization: Secondary | ICD-10-CM

## 2018-02-19 DIAGNOSIS — Z950 Presence of cardiac pacemaker: Secondary | ICD-10-CM

## 2018-02-19 LAB — GLUCOSE, CAPILLARY
GLUCOSE-CAPILLARY: 129 mg/dL — AB (ref 65–99)
GLUCOSE-CAPILLARY: 173 mg/dL — AB (ref 65–99)
GLUCOSE-CAPILLARY: 141 mg/dL — AB (ref 65–99)

## 2018-02-19 LAB — CBC WITH DIFFERENTIAL/PLATELET
BASOS ABS: 0 10*3/uL (ref 0.0–0.1)
Basophils Relative: 0 %
Eosinophils Absolute: 0.3 10*3/uL (ref 0.0–0.7)
Eosinophils Relative: 5 %
HEMATOCRIT: 32.4 % — AB (ref 39.0–52.0)
HEMOGLOBIN: 10.6 g/dL — AB (ref 13.0–17.0)
Lymphocytes Relative: 31 %
Lymphs Abs: 1.8 10*3/uL (ref 0.7–4.0)
MCH: 32.9 pg (ref 26.0–34.0)
MCHC: 32.7 g/dL (ref 30.0–36.0)
MCV: 100.6 fL — AB (ref 78.0–100.0)
MONO ABS: 0.5 10*3/uL (ref 0.1–1.0)
Monocytes Relative: 9 %
NEUTROS ABS: 3.2 10*3/uL (ref 1.7–7.7)
NEUTROS PCT: 55 %
Platelets: 134 10*3/uL — ABNORMAL LOW (ref 150–400)
RBC: 3.22 MIL/uL — ABNORMAL LOW (ref 4.22–5.81)
RDW: 14.4 % (ref 11.5–15.5)
WBC: 5.8 10*3/uL (ref 4.0–10.5)

## 2018-02-19 LAB — ECHOCARDIOGRAM COMPLETE
HEIGHTINCHES: 68.5 in
Weight: 4008 oz

## 2018-02-19 LAB — PROTIME-INR
INR: 2.65
PROTHROMBIN TIME: 28.1 s — AB (ref 11.4–15.2)

## 2018-02-19 LAB — BASIC METABOLIC PANEL
ANION GAP: 10 (ref 5–15)
BUN: 33 mg/dL — ABNORMAL HIGH (ref 6–20)
CHLORIDE: 102 mmol/L (ref 101–111)
CO2: 25 mmol/L (ref 22–32)
Calcium: 8.9 mg/dL (ref 8.9–10.3)
Creatinine, Ser: 1.05 mg/dL (ref 0.61–1.24)
GFR calc non Af Amer: >60 mL/min (ref >60)
GLUCOSE: 129 mg/dL — AB (ref 65–99)
Potassium: 3.6 mmol/L (ref 3.5–5.1)
Sodium: 137 mmol/L (ref 135–145)

## 2018-02-19 LAB — MAGNESIUM
MAGNESIUM: 1.5 mg/dL — AB (ref 1.7–2.4)
Magnesium: 1.6 mg/dL — ABNORMAL LOW (ref 1.7–2.4)

## 2018-02-19 MED ORDER — POTASSIUM CHLORIDE CRYS ER 20 MEQ PO TBCR
20.0000 meq | EXTENDED_RELEASE_TABLET | Freq: Once | ORAL | Status: AC
  Administered 2018-02-19: 20 meq via ORAL
  Filled 2018-02-19: qty 1

## 2018-02-19 MED ORDER — AMIODARONE HCL 200 MG PO TABS
400.0000 mg | ORAL_TABLET | Freq: Two times a day (BID) | ORAL | Status: DC
  Administered 2018-02-19 – 2018-02-23 (×8): 400 mg via ORAL
  Filled 2018-02-19 (×8): qty 2

## 2018-02-19 MED ORDER — LIVING BETTER WITH HEART FAILURE BOOK
Freq: Once | Status: AC
  Administered 2018-02-19: 10:00:00

## 2018-02-19 MED ORDER — WARFARIN SODIUM 5 MG PO TABS
5.0000 mg | ORAL_TABLET | Freq: Once | ORAL | Status: AC
  Administered 2018-02-19: 5 mg via ORAL
  Filled 2018-02-19: qty 1

## 2018-02-19 MED ORDER — MAGNESIUM SULFATE 2 GM/50ML IV SOLN
2.0000 g | Freq: Once | INTRAVENOUS | Status: AC
  Administered 2018-02-19: 2 g via INTRAVENOUS
  Filled 2018-02-19: qty 50

## 2018-02-19 MED ORDER — METOPROLOL TARTRATE 25 MG PO TABS
25.0000 mg | ORAL_TABLET | Freq: Two times a day (BID) | ORAL | Status: DC
  Administered 2018-02-19 – 2018-02-23 (×8): 25 mg via ORAL
  Filled 2018-02-19 (×8): qty 1

## 2018-02-19 NOTE — Progress Notes (Signed)
Orthopedic Tech Progress Note Patient Details:  [name] [birth year] [id number]  Ortho Devices Type of Ortho Device: Radio broadcast assistant Ortho Device/Splint Location: bilateral Ortho Device/Splint Interventions: Application   Post Interventions Patient Tolerated: Well Instructions Provided: Care of device   [name] 02/19/2018, 9:40 AM

## 2018-02-19 NOTE — Progress Notes (Signed)
Pt states that he takes his "heart pill" (Carvedilol) twice each day. This med appears on his PTA med list. Current inpatient order has this medication listed as daily. Will continue to monitor.

## 2018-02-19 NOTE — Progress Notes (Signed)
Notified cardiology that pt had a 3.24 second pause.

## 2018-02-19 NOTE — Progress Notes (Addendum)
Progress Note  Patient Name: [name] Date of Encounter: 02/19/2018  Primary Cardiologist: Dr. Ladell Heads- Ladona Ridgel  Subjective   Reports excellent UOP overnight. Eats a lot of processed foods - cooks for himself - "I grew up on salt." Regales me with stories of preservation of hog with bags of salt from his past.  Inpatient Medications    Scheduled Meds: . allopurinol  100 mg Oral Daily  . atorvastatin  20 mg Oral Daily  . carvedilol  6.25 mg Oral Daily  . cholecalciferol  1,000 Units Oral Daily  . furosemide  80 mg Intravenous BID  . insulin aspart  0-20 Units Subcutaneous TID WC  . Living Better with Heart Failure Book   Does not apply Once  . mometasone-formoterol  2 puff Inhalation BID  . potassium chloride SA  15 mEq Oral Daily  . potassium chloride  20 mEq Oral Once  . sodium chloride flush  3 mL Intravenous Q12H  . tamsulosin  0.4 mg Oral BID  . warfarin  5 mg Oral ONCE-1800  . Warfarin - Pharmacist Dosing Inpatient   Does not apply q1800   Continuous Infusions: . sodium chloride     PRN Meds: sodium chloride, acetaminophen, ondansetron (ZOFRAN) IV, polyethylene glycol, sodium chloride flush   Vital Signs    Vitals:   02/18/18 2221 02/19/18 0102 02/19/18 0555 02/19/18 0825  BP:  (!) 110/54 (!) 98/53 (!) 116/50  Pulse: 94 82 (!) 52 79  Resp:  18 18   Temp:  (!) 97.5 F (36.4 C) (!) 97.3 F (36.3 C)   TempSrc:  Oral Oral   SpO2:  100% 100% 100%  Weight:   250 lb 8 oz (113.6 kg)   Height:        Intake/Output Summary (Last 24 hours) at 02/19/2018 0941 Last data filed at 02/19/2018 0908 Gross per 24 hour  Intake 293 ml  Output 3200 ml  Net -2907 ml   Filed Weights   02/18/18 1952 02/19/18 0555  Weight: 256 lb 1.6 oz (116.2 kg) 250 lb 8 oz (113.6 kg)    Telemetry    Atrial fib V pacing frequent PVCs - Personally Reviewed  Physical Exam   GEN: No acute distress, obese WM HEENT: Normocephalic, atraumatic, sclera non-icteric. Neck:  mod elevated JVD, no bruits Cardiac: Irregular, rate controlled, no murmurs, rubs, or gallops.  Radials/DP/PT 1+ and equal bilaterally.  Respiratory: Clear to auscultation bilaterally. Breathing is unlabored. GI: Soft, nontender, non-distended, BS +x 4. MS: no deformity. Extremities: No clubbing or cyanosis. 3+ stiff bilateral LE edema, wrapped in Unna boots.  Neuro:  AAOx3. Follows commands. Psych:  Responds to questions appropriately with a normal affect.  Labs    Chemistry Recent Labs  Lab 02/17/18 1500 02/18/18 2128 02/19/18 0525  NA 140 137 137  K 4.9 4.4 3.6  CL 102 102 102  CO2 24 25 25   GLUCOSE 122* 124* 129*  BUN 34* 36* 33*  CREATININE 1.12 1.24 1.05  CALCIUM 9.1 8.7* 8.9  PROT  --  5.7*  --   ALBUMIN  --  3.2*  --   AST  --  23  --   ALT  --  19  --   ALKPHOS  --  78  --   BILITOT  --  0.6  --   GFRNONAA 59* 50* >60  GFRAA 68 58* >60  ANIONGAP  --  10 10     Hematology Recent Labs  Lab 02/18/18 2128  02/19/18 0525  WBC 5.7 5.8  RBC 3.23* 3.22*  HGB 10.6* 10.6*  HCT 32.6* 32.4*  MCV 100.9* 100.6*  MCH 32.8 32.9  MCHC 32.5 32.7  RDW 14.3 14.4  PLT 133* 134*    BNP Recent Labs  Lab 02/18/18 2128  BNP 325.8*     Radiology    Dg Chest 2 View  Result Date: 02/19/2018 CLINICAL DATA:  Short of breath, heart failure EXAM: CHEST - 2 VIEW COMPARISON:  12/18/2017 FINDINGS: Hypoventilation with bibasilar atelectasis, slightly increased from the prior study. Negative for heart failure or edema. Negative for effusion. AICD unchanged in position IMPRESSION: Hypoventilation with bibasilar atelectasis. Electronically Signed   By: Marlan Palau M.D.   On: 02/19/2018 08:28   Cardiac Studies   2d echo pending.  Patient Profile     51 y/o with NICM (cath 2006 with no CAD, BIVICD implanted 2013 downgraded to BiV-PPM 2017), chronic combined systolic and diastolic HF, permanent atrial fibrillation, bradycardia on prior Holter 2011, HTN, HLD, DM II, probable  CKD II-III, anemia who was admitted from the office for worsening CHF after having failed outpatient diuresis attempts. Of note, metolazone and spironolactone were discontinued in 01/2018 due to AKI with BUN 80/Cr 1.85. He subsequently developed worsening edema, prompting OP increases in Lasix without full effect.  Assessment & Plan    1. Acute on chronic combined CHF/ NICM - last EF 40% in 2017, repeat echo pending. He reports excellent output on IV Lasix thus far. Unna wrappings have been placed as suggested at OP visit and patient feels comfortable. Continue current dose of IV Lasix, watch CO2 and Cr. Will discuss ACEI and BB with MD - he had recent AKI in 01/2018 with Cr up to 1.8 when diuretics were escalated. Blood pressure is soft at times, so I have tentatively stopped lisinopril while he is being diuresed (already got this AM however). Furthermore, carvedilol was only ordered for once daily, will clarify with Dr. Royann Shivers based on BP readings. Rx CHF book, get nutrition to see to help walk him through lower sodium options. CHF coordinator was in to see him this AM as well.   2. Frequent PVCs/ventricular bigeminy on telemetry - Mg repleted overnight, recheck this AM. Will also replete potassium to goal 4 or greater. (Will give in addition to scheduled daily 15 meq.) If PVCs persist despite electrolyte repletion, consider as possibly contributing to #1. These were present on 11/2017 EKG as well, but not on 04/2017 tracing.  3. Permanent atrial fib with BiV-PPM - continue Coumadin per pharmacy.  4. CKD stage II-III - Cr in 2019 variable; most recent baseline appears around 1.3 with AKI to 1.85 in 01/2018 and 2.63 in 08/2018. Will need to monitor.  5. Macrocytic anemia with mild thrombocytopenia - MCV chronically elevated, but slight Hgb downtrend from prior values (11.4->10.6). Check anemia panel - if unable to add onto blood already drawn, OK to draw in AM.  6. HTN - follow in context of the  above.  7. Hyperlipidemia - continue statin.  8. DM2 - currently on SSI. Does not appear to be on any agents at home.  For questions or updates, please contact [facility] Please consult [email or web address] for contact info under Cardiology/STEMI.  Signed, Laurann Montana, PA-C 02/19/2018, 9:41 AM    I have seen and examined the patient along with Laurann Montana, PA-C.  I have reviewed the chart, notes and new data.  I agree with PA/NP's note.  Key  new complaints: feeling better, only complaint is condom cath won't stay put. No dyspnea at rest Key examination changes: massive leg and pedal edema, JVD, clear lungs Key new findings / data: creatinine 1.0 - lots of room for diuresis at this time  PLAN: Biventricular failure, R>L due to sodium indiscretion and need to adjust diuretics for renal dysfunction. However, has chronically poor CRT percentage (some PVCs, some, AV conducted atrial fibrillation). Need to suppress AV conduction and PVCs to enhance CRT. Check device today. May need to switch carvedilol (dose maxed out due to low BP) to metoprolol for better rhythm management. Consider adding amiodarone, but this will add its own baggage of problems. Would avoid digoxin due to the volatility of his kidney function. Would keep off spironolactone. Anticipate need for another 3-4 days of IV diuretics. Need to carefully re-establish "dry weight". I suspect he has lost true tissue weight  Thurmon Fair, MD, [facility] [phone] 02/19/2018, 10:41 AM

## 2018-02-19 NOTE — Progress Notes (Signed)
  Echocardiogram 2D Echocardiogram has been performed.  [name] F 02/19/2018, 4:26 PM

## 2018-02-19 NOTE — Consult Note (Addendum)
ELECTROPHYSIOLOGY CONSULT NOTE    Patient ID: [name] MRN: [id number], DOB/AGE: [birth year] 84 y.o.  Admit date: 02/18/2018 Date of Consult: 02/19/2018  Primary Physician: Romero Belling, MD Primary Cardiologist: Jens Som Electrophysiologist: Ladona Ridgel  Patient Profile: [name] is a 84 y.o. male with a history of NICM, chronic systolic heart failure, permanent atrial fibrillation, complete heart block, hypertension, diabetes who is being seen today for the evaluation of decreased CRT pacing percentage at the request of Dr C.  HPI:  [name] is a 84 y.o. male with the above past medical history. He has had worsening renal function recently and his diuretics were adjusted. He subsequently had significant worsening of LE edema and shortness of breath. He is being diuresed and is already improved, but still has a significant amount of fluid on board.  Device interrogation demonstrated decreased true CRT pacing percentage (55%) with bigeminal PVC's and VSR pacing.  EP has been asked to evaluate for treatment options.  Echo 06/2016 demonstrated EF 35-40%, mild MR, LA 43.  Echocardiogram 3/19 EF 40-45% severely dilated LA  He denies chest pain, palpitations, nausea, vomiting, dizziness, syncope or early satiety.  Past Medical History:  Diagnosis Date  . Anemia   . Atrial fibrillation (HCC)   . BPH (benign prostatic hyperplasia)   . Bradycardia   . Cardiomyopathy   . CHF (congestive heart failure) (HCC)   . Diabetes mellitus type II   . GERD (gastroesophageal reflux disease)   . History of colonoscopy   . HTN (hypertension)   . Hyperlipidemia   . ICD (implantable cardiac defibrillator) in place 10/14/2012   biventricular  . OA (osteoarthritis)   . Obesity   . Peptic ulcer      Surgical History:  Past Surgical History:  Procedure Laterality Date  . EP IMPLANTABLE DEVICE Left   . EP IMPLANTABLE DEVICE N/A 07/27/2016   Procedure: BIV Pacemaker  downgrade;  Surgeon: Marinus Maw, MD;  Location: [facility] INVASIVE CV LAB;  Service: Cardiovascular;  Laterality: N/A;  . ESOPHAGOGASTRODUODENOSCOPY  06/24/2002  . L-spine  1965  . Lumbar L4-5 & S1  02/2000  . TOTAL KNEE ARTHROPLASTY  1997   right  . TOTAL KNEE ARTHROPLASTY Left 02/15/2015   Procedure: LEFT TOTAL KNEE ARTHROPLASTY;  Surgeon: Durene Romans, MD;  Location: [facility] ORS;  Service: Orthopedics;  Laterality: Left;     Medications Prior to Admission  Medication Sig Dispense Refill Last Dose  . acetaminophen (TYLENOL) 650 MG CR tablet Take 650-1,300 mg by mouth every 8 (eight) hours as needed for pain.   Taking  . allopurinol (ZYLOPRIM) 100 MG tablet TAKE 1 TABLET EVERY DAY 90 tablet 1 Taking  . atorvastatin (LIPITOR) 20 MG tablet TAKE 1 TABLET EVERY DAY 90 tablet 3 Taking  . carvedilol (COREG) 12.5 MG tablet Take 0.5 tablets (6.25 mg total) by mouth 2 (two) times daily. (Patient taking differently: Take 6.25 mg by mouth daily. ) 90 tablet 3 Taking  . cholecalciferol (VITAMIN D) 1000 units tablet Take 1,000 Units by mouth daily.   Taking  . Fluticasone-Salmeterol (ADVAIR) 100-50 MCG/DOSE AEPB Inhale 1 puff into the lungs 2 (two) times daily. 1 each 3 Taking  . furosemide (LASIX) 40 MG tablet Take 40 mg twice daily for two days, then take 40 mg daily 35 tablet 2 Taking  . lisinopril (PRINIVIL,ZESTRIL) 20 MG tablet TAKE 1 TABLET EVERY DAY 90 tablet 3 Taking  . metolazone (ZAROXOLYN) 2.5 MG tablet Take 1 tablet weekly as needed  for weight gain of 5 lbs 30 tablet 3 Taking  . OVER THE COUNTER MEDICATION Take by mouth as needed (as directed). OTC Stomach medication   Taking  . polyethylene glycol (MIRALAX / GLYCOLAX) packet Take 17 g by mouth daily as needed (constipation).   Taking  . potassium chloride SA (K-DUR,KLOR-CON) 20 MEQ tablet Take 20 meq twice daily for two days then take 20 meq daily. 35 tablet 2 Taking  . tamsulosin (FLOMAX) 0.4 MG CAPS capsule TAKE 1 CAPSULE TWICE DAILY 180 capsule 2  Taking  . warfarin (COUMADIN) 5 MG tablet TAKE 1/2 TO 1 TABLET DAILY AS DIRECTED BY COUMADIN CLINIC 90 tablet 1 Taking    Inpatient Medications:  . allopurinol  100 mg Oral Daily  . atorvastatin  20 mg Oral Daily  . carvedilol  6.25 mg Oral Daily  . cholecalciferol  1,000 Units Oral Daily  . furosemide  80 mg Intravenous BID  . insulin aspart  0-20 Units Subcutaneous TID WC  . mometasone-formoterol  2 puff Inhalation BID  . potassium chloride SA  15 mEq Oral Daily  . sodium chloride flush  3 mL Intravenous Q12H  . tamsulosin  0.4 mg Oral BID  . warfarin  5 mg Oral ONCE-1800  . Warfarin - Pharmacist Dosing Inpatient   Does not apply q1800    Allergies: No Known Allergies  Social History   Socioeconomic History  . Marital status: Widowed    Spouse name: Not on file  . Number of children: Not on file  . Years of education: Not on file  . Highest education level: Not on file  Social Needs  . Financial resource strain: Not on file  . Food insecurity - worry: Not on file  . Food insecurity - inability: Not on file  . Transportation needs - medical: Not on file  . Transportation needs - non-medical: Not on file  Occupational History  . Occupation: Retired  Tobacco Use  . Smoking status: Never Smoker  . Smokeless tobacco: Never Used  Substance and Sexual Activity  . Alcohol use: No  . Drug use: No  . Sexual activity: Not on file  Other Topics Concern  . Not on file  Social History Narrative  . Not on file     Family History  Problem Relation Age of Onset  . Heart disease Father   . Heart disease Mother   . Diabetes Mother   . Hypertension Brother   . Hypertension Brother   . Cancer Neg Hx      Review of Systems: All other systems reviewed and are otherwise negative except as noted above.  Physical Exam: Vitals:   02/18/18 2221 02/19/18 0102 02/19/18 0555 02/19/18 0825  BP:  (!) 110/54 (!) 98/53 (!) 116/50  Pulse: 94 82 (!) 52 79  Resp:  18 18   Temp:  (!)  97.5 F (36.4 C) (!) 97.3 F (36.3 C)   TempSrc:  Oral Oral   SpO2:  100% 100% 100%  Weight:   250 lb 8 oz (113.6 kg)   Height:        GEN- The patient is elderly and obese appearing, alert and oriented x 3 today.   HEENT: normocephalic, atraumatic; sclera clear, conjunctiva pink; hearing intact; oropharynx clear; neck supple Lungs- Clear to ausculation bilaterally, normal work of breathing.  No wheezes, rales, rhonchi Heart- Irregular rate and rhythm  GI- soft, non-tender, non-distended, bowel sounds present Extremities- no clubbing, cyanosis, 3+BLE edema, legs wrapped  MS-  no significant deformity or atrophy Skin- warm and dry, no rash or lesion Psych- euthymic mood, full affect Neuro- strength and sensation are intact  Labs:   Lab Results  Component Value Date   WBC 5.8 02/19/2018   HGB 10.6 (L) 02/19/2018   HCT 32.4 (L) 02/19/2018   MCV 100.6 (H) 02/19/2018   PLT 134 (L) 02/19/2018    Recent Labs  Lab 02/18/18 2128 02/19/18 0525  NA 137 137  K 4.4 3.6  CL 102 102  CO2 25 25  BUN 36* 33*  CREATININE 1.24 1.05  CALCIUM 8.7* 8.9  PROT 5.7*  --   BILITOT 0.6  --   ALKPHOS 78  --   ALT 19  --   AST 23  --   GLUCOSE 124* 129*      Radiology/Studies: Dg Chest 2 View  Result Date: 02/19/2018 CLINICAL DATA:  Short of breath, heart failure EXAM: CHEST - 2 VIEW COMPARISON:  12/18/2017 FINDINGS: Hypoventilation with bibasilar atelectasis, slightly increased from the prior study. Negative for heart failure or edema. Negative for effusion. AICD unchanged in position IMPRESSION: Hypoventilation with bibasilar atelectasis. Electronically Signed   By: Marlan Palau M.D.   On: 02/19/2018 08:28    EKG:AF, CRT pacing, bigeminal PVC's (personally reviewed)  TELEMETRY: pacing with frequent PVC's (personally reviewed)  Assessment/Plan: 1.  PVC's Pt has been in bigeminy persistently which is resulting in decreased CRT pacing. Up titration of BB is limited by hypotension I  think it would be reasonable to try amiodarone for PVC suppression to see if it helps his functional status. We would need to be careful with warfarin. Dr Graciela Husbands to see later today Otherwise normal device function  2.  Acute on chronic systolic heart failure Per Dr C  3.  Permanent atrial fibrillation Continue Warfarin for CHADS2VASC of at least 5  4.  HTN Stable No change required today  Signed, Gypsy Balsam, NP 02/19/2018 12:20 PM  Patient seen and examined and agree with the note above  ECG personally reviewed ventricular pacing with underlying atrial fibrillation PVCs with a right bundle branch block superior axis morphology with a delayed intrinsicoid deflection suggestive of an epicardial focus  Assessment and recommendations PVCs-new onset  Atrial fibrillation-permanent-anticoagulated with warfarin  Anemia  Acute/chronic systolic and diastolic heart failure  Hypertension  Nonischemic cardiomyopathy   The patient has new onset PVCs in the context of permanent atrial fibrillation.  I am not sure why they have involved.  As noted above, the appear to have an epicardial focus that in his age would preclude catheter ablation.  The acute heart failure may will be related to the loss of biventricular pacing and the functional bradycardia  I agree that amiodarone is a very reasonable drug to undertake PVC suppression.   He has been obligated to warfarin because of insurance issues; I would ask that we be in contact with the Coumadin clinic at [facility] to see whether his insurance may have changed and NOAC might become affordable.  Will ask pharmacy to help manage his warfarin with the addition of amiodarone

## 2018-02-19 NOTE — Progress Notes (Signed)
Messaged cardiology to let them know magnesium was 1.5.

## 2018-02-19 NOTE — Progress Notes (Signed)
Add on Mg level 1.6, however, this was added onto the BMET that was drawn just after the mag sulfate was started this AM. Will check 2pm level. Care order written to call if 1.8 or lower. Dellamae Rosamilia PA-C

## 2018-02-19 NOTE — Progress Notes (Addendum)
Reviewed brady strips with Dr. Royann Shivers - not a device malfunction, these were obtained while pacemaker rep was doing threshold testing (he has discussed with PPM rep).  Dayna Dunn PA-C

## 2018-02-19 NOTE — Progress Notes (Signed)
ANTICOAGULATION CONSULT NOTE - Follow Up Consult  Pharmacy Consult for Warfarin Indication: atrial fibrillation  No Known Allergies  Patient Measurements: Height: 5' 8.5" (174 cm) Weight: 250 lb 8 oz (113.6 kg) IBW/kg (Calculated) : 69.55  Vital Signs: Temp: 97.3 F (36.3 C) (03/13 0555) Temp Source: Oral (03/13 0555) BP: 116/50 (03/13 0825) Pulse Rate: 79 (03/13 0825)  Labs: Recent Labs    02/17/18 1500 02/18/18 2128 02/19/18 0525  HGB  --  10.6* 10.6*  HCT  --  32.6* 32.4*  PLT  --  133* 134*  LABPROT  --  28.9* 28.1*  INR  --  2.75 2.65  CREATININE 1.12 1.24 1.05    Estimated Creatinine Clearance: 61.1 mL/min (by C-G formula based on SCr of 1.05 mg/dL).  Assessment: 84 year old male on Warfarin prior to admission for atrial fibrillation who presented directly from Cardiologist office for IV diuresis for HF/edema. INR is therapeutic on home regimen of 2.5 mg po every Monday and Thursday and 5 mg all other days.   INR is therapeutic at 2.65 today. Warfarin dose was given yesterday PM.  No significant drug interactions that would effect INR noted.  No bleeding reported.   Goal of Therapy:  INR 2-3 Monitor platelets by anticoagulation protocol: Yes   Plan:  Warfarin 5 mg po x1 today (same as home dose) Daily PT/INR   Link Snuffer, PharmD, BCPS, BCCCP Clinical Pharmacist Clinical phone 02/19/2018 until 3:30PM - #[phone] After hours, please call [phone] 02/19/2018,8:40 AM

## 2018-02-19 NOTE — Progress Notes (Signed)
Magnesium = 1.3 last night before IV Lasix given. MD paged. Orders received for IV Magnesium 2G. Will pass on to oncoming shift RN need for repeat lab draw and continue to monitor.

## 2018-02-19 NOTE — Plan of Care (Signed)
  Progressing Activity: Risk for activity intolerance will decrease 02/19/2018 2159 - Progressing by Carolanne Grumbling, RN Pt. SOB with exertion but states it's getting better.  Elimination: Will not experience complications related to bowel motility 02/19/2018 2159 - Progressing by Carolanne Grumbling, RN Pt. Diuresing well Safety: Ability to remain free from injury will improve 02/19/2018 2159 - Progressing by Carolanne Grumbling, RN

## 2018-02-20 ENCOUNTER — Encounter (HOSPITAL_COMMUNITY): Payer: Self-pay

## 2018-02-20 LAB — BASIC METABOLIC PANEL
ANION GAP: 11 (ref 5–15)
BUN: 28 mg/dL — ABNORMAL HIGH (ref 6–20)
CHLORIDE: 98 mmol/L — AB (ref 101–111)
CO2: 28 mmol/L (ref 22–32)
Calcium: 9 mg/dL (ref 8.9–10.3)
Creatinine, Ser: 1.08 mg/dL (ref 0.61–1.24)
GFR calc non Af Amer: 60 mL/min — ABNORMAL LOW (ref >60)
Glucose, Bld: 170 mg/dL — ABNORMAL HIGH (ref 65–99)
POTASSIUM: 3.7 mmol/L (ref 3.5–5.1)
SODIUM: 137 mmol/L (ref 135–145)

## 2018-02-20 LAB — CBC WITH DIFFERENTIAL/PLATELET
BASOS ABS: 0 10*3/uL (ref 0.0–0.1)
BASOS PCT: 0 %
Eosinophils Absolute: 0 10*3/uL (ref 0.0–0.7)
Eosinophils Relative: 0 %
HCT: 34.5 % — ABNORMAL LOW (ref 39.0–52.0)
HEMOGLOBIN: 11.3 g/dL — AB (ref 13.0–17.0)
Lymphocytes Relative: 8 %
Lymphs Abs: 0.8 10*3/uL (ref 0.7–4.0)
MCH: 32.7 pg (ref 26.0–34.0)
MCHC: 32.8 g/dL (ref 30.0–36.0)
MCV: 99.7 fL (ref 78.0–100.0)
MONOS PCT: 15 %
Monocytes Absolute: 1.5 10*3/uL — ABNORMAL HIGH (ref 0.1–1.0)
NEUTROS ABS: 7.6 10*3/uL (ref 1.7–7.7)
NEUTROS PCT: 77 %
Platelets: 132 10*3/uL — ABNORMAL LOW (ref 150–400)
RBC: 3.46 MIL/uL — AB (ref 4.22–5.81)
RDW: 14.1 % (ref 11.5–15.5)
WBC: 9.9 10*3/uL (ref 4.0–10.5)

## 2018-02-20 LAB — GLUCOSE, CAPILLARY
GLUCOSE-CAPILLARY: 182 mg/dL — AB (ref 65–99)
Glucose-Capillary: 130 mg/dL — ABNORMAL HIGH (ref 65–99)
Glucose-Capillary: 174 mg/dL — ABNORMAL HIGH (ref 65–99)
Glucose-Capillary: 182 mg/dL — ABNORMAL HIGH (ref 65–99)
Glucose-Capillary: 278 mg/dL — ABNORMAL HIGH (ref 65–99)

## 2018-02-20 LAB — MAGNESIUM: MAGNESIUM: 1.2 mg/dL — AB (ref 1.7–2.4)

## 2018-02-20 LAB — IRON AND TIBC
Iron: 17 ug/dL — ABNORMAL LOW (ref 45–182)
Saturation Ratios: 7 % — ABNORMAL LOW (ref 17.9–39.5)
TIBC: 245 ug/dL — ABNORMAL LOW (ref 250–450)
UIBC: 228 ug/dL

## 2018-02-20 LAB — RETICULOCYTES
RBC.: 3.46 MIL/uL — AB (ref 4.22–5.81)
RETIC COUNT ABSOLUTE: 38.1 10*3/uL (ref 19.0–186.0)
Retic Ct Pct: 1.1 % (ref 0.4–3.1)

## 2018-02-20 LAB — FOLATE: Folate: 6.5 ng/mL (ref >5.9)

## 2018-02-20 LAB — FERRITIN: Ferritin: 115 ng/mL (ref 24–336)

## 2018-02-20 LAB — PROTIME-INR
INR: 3.1
PROTHROMBIN TIME: 31.7 s — AB (ref 11.4–15.2)

## 2018-02-20 LAB — VITAMIN B12: Vitamin B-12: 439 pg/mL (ref 180–914)

## 2018-02-20 LAB — T4, FREE: Free T4: 1.01 ng/dL (ref 0.61–1.12)

## 2018-02-20 LAB — OCCULT BLOOD X 1 CARD TO LAB, STOOL: Fecal Occult Bld: NEGATIVE

## 2018-02-20 LAB — URIC ACID: Uric Acid, Serum: 7.5 mg/dL (ref 4.4–7.6)

## 2018-02-20 LAB — TSH: TSH: 1.238 u[IU]/mL (ref 0.350–4.500)

## 2018-02-20 MED ORDER — POTASSIUM CHLORIDE CRYS ER 20 MEQ PO TBCR
20.0000 meq | EXTENDED_RELEASE_TABLET | Freq: Every evening | ORAL | Status: DC
  Administered 2018-02-20: 20 meq via ORAL
  Filled 2018-02-20: qty 1

## 2018-02-20 MED ORDER — POTASSIUM CHLORIDE CRYS ER 20 MEQ PO TBCR: 40.0000 meq | EXTENDED_RELEASE_TABLET | Freq: Once | ORAL | Status: DC

## 2018-02-20 MED ORDER — MAGNESIUM SULFATE 4 GM/100ML IV SOLN
4.0000 g | Freq: Once | INTRAVENOUS | Status: AC
  Administered 2018-02-20: 4 g via INTRAVENOUS
  Filled 2018-02-20: qty 100

## 2018-02-20 MED ORDER — MAGNESIUM OXIDE 400 (241.3 MG) MG PO TABS
400.0000 mg | ORAL_TABLET | Freq: Two times a day (BID) | ORAL | Status: DC
  Administered 2018-02-20 – 2018-02-23 (×6): 400 mg via ORAL
  Filled 2018-02-20 (×7): qty 1

## 2018-02-20 MED ORDER — POTASSIUM CHLORIDE CRYS ER 20 MEQ PO TBCR: 20.0000 meq | EXTENDED_RELEASE_TABLET | Freq: Every evening | ORAL | Status: DC

## 2018-02-20 MED ORDER — POTASSIUM CHLORIDE CRYS ER 20 MEQ PO TBCR
40.0000 meq | EXTENDED_RELEASE_TABLET | Freq: Every day | ORAL | Status: DC
  Administered 2018-02-20 – 2018-02-21 (×2): 40 meq via ORAL
  Filled 2018-02-20 (×2): qty 2

## 2018-02-20 MED ORDER — WARFARIN SODIUM 2.5 MG PO TABS
2.5000 mg | ORAL_TABLET | Freq: Once | ORAL | Status: AC
  Administered 2018-02-20: 2.5 mg via ORAL
  Filled 2018-02-20: qty 1

## 2018-02-20 NOTE — Discharge Instructions (Signed)

## 2018-02-20 NOTE — Progress Notes (Signed)
Patient's daughter arrived at bedside and requested to speak with provider about father's care. Patient consented to information sharing. Daughter informed of recent hospital course. He had refused home health earlier with care management, but daughter does report it's getting harder and harder to get him in the car to get up to the Coumadin clinic for physical INR check. I did offer home health as a potential solution to this issue as a HHRN could draw his INR when needed and communicate that to our office- they are both open to this idea. Care management made aware. This of course will be contingent on whether or not he goes home on Coumadin at all. He was on this prior to admission, but given amiodarone initiation, we've asked care management to do a benefits check on NOACs as an alternative. Apparently several years ago these proved to be too expensive but things may have changed since then. Eustacio Ellen PA-C

## 2018-02-20 NOTE — Progress Notes (Addendum)
Progress Note  Patient Name: [name] Date of Encounter: 02/20/2018   Primary Cardiologist: Dr. Ladell Heads- Ladona Ridgel  Subjective   Continues to diurese briskly. Feels about the same, however. Notes that he was unable to stand up on his own without assistance due to discomfort in his feet and legs from the swelling. No CP or dyspnea today.  Inpatient Medications    Scheduled Meds: . allopurinol  100 mg Oral Daily  . amiodarone  400 mg Oral BID  . atorvastatin  20 mg Oral Daily  . cholecalciferol  1,000 Units Oral Daily  . furosemide  80 mg Intravenous BID  . insulin aspart  0-20 Units Subcutaneous TID WC  . metoprolol tartrate  25 mg Oral BID  . mometasone-formoterol  2 puff Inhalation BID  . potassium chloride SA  15 mEq Oral Daily  . sodium chloride flush  3 mL Intravenous Q12H  . tamsulosin  0.4 mg Oral BID  . Warfarin - Pharmacist Dosing Inpatient   Does not apply q1800   Continuous Infusions: . sodium chloride     PRN Meds: sodium chloride, acetaminophen, ondansetron (ZOFRAN) IV, polyethylene glycol, sodium chloride flush   Vital Signs    Vitals:   02/19/18 1939 02/19/18 2100 02/20/18 0029 02/20/18 0410  BP: (!) 93/53 101/64 (!) 109/59 110/74  Pulse: 89 89 84 86  Resp: 18  18 18   Temp: (!) 97.3 F (36.3 C)  98 F (36.7 C) 97.8 F (36.6 C)  TempSrc: Oral  Oral Oral  SpO2: 100% 100% 100% 98%  Weight:    244 lb 1.6 oz (110.7 kg)  Height:        Intake/Output Summary (Last 24 hours) at 02/20/2018 0807 Last data filed at 02/20/2018 1191 Gross per 24 hour  Intake 963 ml  Output 4350 ml  Net -3387 ml   Filed Weights   02/18/18 1952 02/19/18 0555 02/20/18 0410  Weight: 256 lb 1.6 oz (116.2 kg) 250 lb 8 oz (113.6 kg) 244 lb 1.6 oz (110.7 kg)    Telemetry    Atrial fib with V pacing and ventricular bigeminy - Personally Reviewed  Physical Exam   GEN: No acute distress, obese WM HEENT: Normocephalic, atraumatic, sclera non-icteric. Neck:  +JVD Cardiac: Regularly irregular rhythm, no murmurs, rubs, or gallops.  Radials/DP/PT 1+ and equal bilaterally.  Respiratory: Clear to auscultation bilaterally. Breathing is unlabored. GI: Soft, nontender, non-distended, BS +x 4. MS: no deformity. Extremities: No clubbing or cyanosis. 2+ BLE edema, wrapped, but slightly improved from yesterday.  Neuro:  AAOx3. Follows commands. Psych:  Responds to questions appropriately with a normal affect.  Labs    Chemistry Recent Labs  Lab 02/17/18 1500 02/18/18 2128 02/19/18 0525  NA 140 137 137  K 4.9 4.4 3.6  CL 102 102 102  CO2 24 25 25   GLUCOSE 122* 124* 129*  BUN 34* 36* 33*  CREATININE 1.12 1.24 1.05  CALCIUM 9.1 8.7* 8.9  PROT  --  5.7*  --   ALBUMIN  --  3.2*  --   AST  --  23  --   ALT  --  19  --   ALKPHOS  --  78  --   BILITOT  --  0.6  --   GFRNONAA 59* 50* >60  GFRAA 68 58* >60  ANIONGAP  --  10 10     Hematology Recent Labs  Lab 02/18/18 2128 02/19/18 0525 02/20/18 0712  WBC 5.7 5.8 9.9  RBC 3.23*  3.22* 3.46*  3.46*  HGB 10.6* 10.6* 11.3*  HCT 32.6* 32.4* 34.5*  MCV 100.9* 100.6* 99.7  MCH 32.8 32.9 32.7  MCHC 32.5 32.7 32.8  RDW 14.3 14.4 14.1  PLT 133* 134* 132*    Cardiac EnzymesNo results for input(s): TROPONINI in the last 168 hours. No results for input(s): TROPIPOC in the last 168 hours.   BNP Recent Labs  Lab 02/18/18 2128  BNP 325.8*     DDimer No results for input(s): DDIMER in the last 168 hours.   Radiology    Dg Chest 2 View  Result Date: 02/19/2018 CLINICAL DATA:  Short of breath, heart failure EXAM: CHEST - 2 VIEW COMPARISON:  12/18/2017 FINDINGS: Hypoventilation with bibasilar atelectasis, slightly increased from the prior study. Negative for heart failure or edema. Negative for effusion. AICD unchanged in position IMPRESSION: Hypoventilation with bibasilar atelectasis. Electronically Signed   By: Marlan Palau M.D.   On: 02/19/2018 08:28    Cardiac Studies   2d echo  02/19/18 Study Conclusions  - Left ventricle: The cavity size was moderately dilated. Wall   thickness was increased in a pattern of moderate LVH.   Incoordinate septal motion. Systolic function was mildly to   moderately reduced. The estimated ejection fraction was in the   range of 40% to 45%. Diffuse hypokinesis. The study is not   technically sufficient to allow evaluation of LV diastolic   function. - Aortic valve: Mildly calcified leaflets. There was no stenosis.   There was no regurgitation. - Aorta: Aortic root dimension: 38 mm (ED). - Aortic root: The aortic root was top normal in size. - Mitral valve: Mildly thickened leaflets . There was mild to   moderate regurgitation. - Left atrium: Severely dilated. The atrium was normal in size. - Right ventricle: The cavity size was normal. Wall thickness was   normal. AICD wire noted in right ventricle. Systolic function was   low normal. - Right atrium: Severely dilated. AICD wire noted in right atrium. - Tricuspid valve: There was trivial regurgitation. - Pulmonary arteries: PA peak pressure: 21 mm Hg (S) + RAP. - Systemic veins: Not visualized.  Impressions:  - Compared to a prior study in 2017, the LVEF is higher at 40-45%   with global hypokinesis and severe biatrial enlargement.   Patient Profile     84 y/o with NICM (cath 2006 with no CAD, BIVICD implanted 2013 downgraded to BiV-PPM 2017), chronic combined systolic and diastolic HF, permanent atrial fibrillation, bradycardia on prior Holter 2011, HTN, HLD, DM II, probable CKD II-III, anemia who was admitted from the office for worsening CHF after having failed outpatient diuresis attempts. Lives by himself, so eats a lot of processed/high sodium foods. Of note, metolazone and spironolactone were discontinued in 01/2018 due to AKI with BUN 80/Cr 1.85. He subsequently developed worsening edema, prompting OP increases in Lasix without full effect. New frequent PVCs/ventricular  bigeminy noted over the last several months. 2D Echo 02/19/18: mod LVH, EF 40-45%, global HK, low normal RV systolic function. EP saw 3/13 and recommended initiation of amiodarone.  Assessment & Plan    1. Acute on chronic combined CHF/ NICM - coreg changed to metoprolol due to hypotension and lisinopril held. (BP nadir 90's last night). Continues to diurese. Dry weight not totally clear as it appears he may have lost some tissue weight since last stable OP visit where weight was 246. Remains edematous, BUN/Cr stable. Continue current diuretic dose. Anticipate adding back some degree of ACEI/ARB  when we are finally able to scale back diuretic.  2. Frequent PVCs/ventricular bigeminy on telemetry (PVCs dating back to around 11/2017) with relative hypokalemia and hypomagnesemia - appreciate EP assistance. Amiodarone started for PVC suppression 3/13 - anticipate this will be tapered upon dc. Mg fell again. Will give 4g mag sulfate again today and start MagOx 400mg  BID tonight. Got of KCl yesterday but K still 3.7 (goal >4). Will increase to QAM/50meq QPM. Will also check thyroid function given amio initiation/PVCs.   3. Permanent atrial fib with BiV-PPM - continue Coumadin per pharmacy. Cost has prohibited NOAC use previously. Will request care management to evaluate cost of NOACs as inpatient given amio/Coumadin interaction.  4. CKD stage II-III - Cr stable.  5. Macrocytic anemia with mild thrombocytopenia - MCV chronically elevated. Hgb stable. Await anemia panel.  6. HTN - follow in context of the above.  7. Hyperlipidemia - continue statin.  8. DM2 - currently on SSI. Does not appear to be on any agents at home.  Will also order PT eval to help keep him as mobile as we can while diuresing so he does not become more deconditioned.  For questions or updates, please contact [facility] Please consult [email or web address] for contact info under Cardiology/STEMI.  Signed, Laurann Montana, PA-C 02/20/2018, 8:07 AM    I have seen and examined the patient along with Kriste Basque, PA NP.  I have reviewed the chart, notes and new data.  I agree with PA/NP's note.  Key new complaints: improved dyspnea, still substantial edema, biggest complaint is sore feet. No known history of gout (uric acid 6.1 last year) Key examination changes: still has substantial pedal edema, legs wrapped Key new findings / data: creat stable, still very hypomagnesemic, not on PPI Device check reviewed. Poor CRT due to both PVCs and poor rate control.  PLAN: Replenish electrolytes, continue diuresis, PT consult.  Consider increasing metoprolol tomorrow, to attempt to improve CRT.  Thurmon Fair, MD, [facility] [phone] 02/20/2018, 9:17 AM

## 2018-02-20 NOTE — Care Management Note (Signed)
Case Management Note  Patient Details  Name: [name] MRN: [id number] Date of Birth: 1929-10-08  Subjective/Objective:  CHF                 Action/Plan: Patient lives at home with his spouse. PCP:  Romero Belling, MD; has private insurance with [facility] with prescription drug coverage; he has mail order pharmacy that delivers his medication to his home and [facility]. DME - cane and walker at home; Patient could benefit from a Disease Management program with [facility], but patient refused and stated " My daughter does everything for me." CM will continue for follow patient for progression of care.      Expected Discharge Date:  Possibly 02/24/2018            Expected Discharge Plan:  Home w Home Health Services  Discharge planning Services  CM Consult  Choice offered to:  Patient  HH Arranged:  Patient Refused HH  Status of Service:   In progress  If discussed at Long Length of Stay Meetings, dates discussed:    Additional Comments:  Cherrie Distance, RN 02/20/2018, 11:57 AM

## 2018-02-20 NOTE — Plan of Care (Signed)
  Progressing Skin Integrity: Risk for impaired skin integrity will decrease 02/20/2018 2031 - Progressing by Carolanne Grumbling, RN Pt. States his legs feel and look better since having una boots removed.    Not Progressing Activity: Capacity to carry out activities will improve 02/20/2018 2031 - Not Progressing by Carolanne Grumbling, RN Pt. Unable to stand at this time due to bilateral foot pain.

## 2018-02-20 NOTE — Progress Notes (Signed)
Heart Failure Navigator Consult Note  Presentation: Per: [name] Meng--PA JAPHET MORGENTHALER is a 84 y.o. male with PMH of NICM s/p BiV ICD, chronic combined systolic and diastolic HF, permanent atrial fibrillation, bradycardia, HTN, HLD and DM II. He had a cardiac cath in 12/2004 that showed no CAD, EF 40%. Myoview on 05/04/2008 showed EF 31%, prior infarct with mild peri-infarct ischemia. This was reviewed by Dr. Jens Som and felt to be low risk. Holter in 07/2010 showed mildly reduced HR, coreg reduced. He had BiV ICD implanted in 10/2012, this was later downgraded to BiV PPM 07/2016. Echo in 06/2016 showed EF 35-40%, mild LAE.  I last saw the patient on 02/10/2018, he had significant lower extremity edema at the time after his metolazone and spironolactone were discontinued on 01/14/2018 after basic metabolic panel showed creatinine went up to 1.8.  Prior to that, he was on 20 mg daily of Lasix, increase the Lasix to 40 mg twice daily for 2 days before going back to 40 mg daily thereafter. One week basic metabolic panel shows stable renal function is stable.  Patient presents today along with his daughter for re-visit, his weight is actually increased by 4 pounds since last week and a total of 14 pounds since January.  He continued to have worsening lower extremity edema.  He did mention after his diuretic was initially increased, he did have increased urinary output, however it is not enough to remove the amount of fluid.  He denies any orthopnea or PND.  His lung is clear on physical exam.  At this point I am not confident we can remove his excess edema as outpatient.  I have discussed the case with DOD Dr. Chilton Si who agrees with the plan for direct admission and aggressive IV diuresis.  He will need a repeat echocardiogram.  Once his lower extremity edema improved, will also obtain a venous Doppler as well.  Past Medical History:  Diagnosis Date  . Anemia   . Atrial fibrillation (HCC)   . BPH  (benign prostatic hyperplasia)   . Bradycardia   . Cardiomyopathy   . CHF (congestive heart failure) (HCC)   . Diabetes mellitus type II   . GERD (gastroesophageal reflux disease)   . History of colonoscopy   . HTN (hypertension)   . Hyperlipidemia   . ICD (implantable cardiac defibrillator) in place 10/14/2012   biventricular  . OA (osteoarthritis)   . Obesity   . Peptic ulcer     Social History   Socioeconomic History  . Marital status: Widowed    Spouse name: Not on file  . Number of children: Not on file  . Years of education: Not on file  . Highest education level: Not on file  Social Needs  . Financial resource strain: Not on file  . Food insecurity - worry: Not on file  . Food insecurity - inability: Not on file  . Transportation needs - medical: Not on file  . Transportation needs - non-medical: Not on file  Occupational History  . Occupation: Retired  Tobacco Use  . Smoking status: Never Smoker  . Smokeless tobacco: Never Used  Substance and Sexual Activity  . Alcohol use: No  . Drug use: No  . Sexual activity: Not on file  Other Topics Concern  . Not on file  Social History Narrative  . Not on file    ECHO:Study Conclusions  02/19/18  - Left ventricle: The cavity size was moderately dilated. Wall   thickness  was increased in a pattern of moderate LVH.   Incoordinate septal motion. Systolic function was mildly to   moderately reduced. The estimated ejection fraction was in the   range of 40% to 45%. Diffuse hypokinesis. The study is not   technically sufficient to allow evaluation of LV diastolic   function. - Aortic valve: Mildly calcified leaflets. There was no stenosis.   There was no regurgitation. - Aorta: Aortic root dimension: 38 mm (ED). - Aortic root: The aortic root was top normal in size. - Mitral valve: Mildly thickened leaflets . There was mild to   moderate regurgitation. - Left atrium: Severely dilated. The atrium was normal in  size. - Right ventricle: The cavity size was normal. Wall thickness was   normal. AICD wire noted in right ventricle. Systolic function was   low normal. - Right atrium: Severely dilated. AICD wire noted in right atrium. - Tricuspid valve: There was trivial regurgitation. - Pulmonary arteries: PA peak pressure: 21 mm Hg (S) + RAP. - Systemic veins: Not visualized.  Impressions:  - Compared to a prior study in 2017, the LVEF is higher at 40-45%   with global hypokinesis and severe biatrial enlargement.  ------------------------------------------------------------------- Study data:  Comparison was made to the study of 07/06/2016.  Study status:  Routine.  Procedure:  The patient reported no pain pre or post test. Transthoracic echocardiography. Image quality was adequate.  Study completion:  There were no complications. Transthoracic echocardiography.  M-mode, complete 2D, spectral Doppler, and color Doppler.  Birthdate:  Patient birthdate: 05/25/1929.  Age:  Patient is 84 yr old.  Sex:  Gender: male. BMI: 37.5 kg/m^2.  Blood pressure:     116/50  Patient status: Inpatient.  Study date:  Study date: 02/19/2018. Study time: 03:14 PM.  Location:  Bedside.  BNP    Component Value Date/Time   BNP 325.8 (H) 02/18/2018 2128   BNP 209.3 (H) 10/15/2016 1444    ProBNP    Component Value Date/Time   PROBNP 163.0 (H) 08/21/2017 1431     Education Assessment and Provision:  Detailed education and instructions provided on heart failure disease management including the following:  Signs and symptoms of Heart Failure When to call the physician Importance of daily weights Low sodium diet Fluid restriction Medication management Anticipated future follow-up appointments  Patient education given on each of the above topics.  Patient acknowledges understanding and acceptance of all instructions.  I spoke with patient regarding his current hospitalization.  He tells me that he lives  alone and has a daughter that lives locally that helps him a great deal.  He still drives and can get himself to and from appointments per his report.  He has a scale and we reviewed when to contact the physician related to worsening signs and symptoms.  I also reviewed a low sodium diet and high sodium foods to avoid.  He admits that he "struggles" with eating low sodium.  He says that his daughter cooks some but they eat out frequently. I discouraged eating out and encouraged no table salt as well as reading labels for sodium content.  He denies any issues with getting or taking prescribed medications. He will follow with [facility] after discharge.  Education Materials:  "Living Better With Heart Failure" Booklet, Daily Weight Tracker Tool    High Risk Criteria for Readmission and/or Poor Patient Outcomes:   EF <30%-  No 40-45%  2 or more admissions in 6 months- No  Difficult social situation-  No-? lives alone -tells me daughter assists  Demonstrates medication noncompliance- Denies    Barriers of Care: Knowledge and compliance  Discharge Planning:   Plans to return to home alone.  He would benefit from [facility] for ongoing education, symptom recognition and compliance reinforcement.

## 2018-02-20 NOTE — Progress Notes (Signed)
Nutrition Education Note  RD consulted for nutrition education regarding new onset CHF.  RD provided "Low Sodium Nutrition Therapy" handout from the Academy of Nutrition and Dietetics. Reviewed patient's dietary recall. Provided examples on ways to decrease sodium intake in diet. Discouraged intake of processed foods and use of salt shaker. Encouraged fresh fruits and vegetables as well as whole grain sources of carbohydrates to maximize fiber intake.   RD discussed why it is important for patient to adhere to diet recommendations, and emphasized the role of fluids, foods to avoid, and importance of weighing self daily. Teach back method used.  Spoke with pt and daughter at bedside. Daughter had meal plan written out on bedside table. Discussed the use of canned vegetables. Daughter was under the impression that if you drain the vegetables they lose salt. Talked about switching canned vegetables to frozen. Pt admits to eating excessive amounts of restaurant collard greens. Talked about alternatives they could try at home.   Expect fair compliance.  Body mass index is 36.58 kg/m. Pt meets criteria for obese based on current BMI.  Current diet order is 2 gram sodium, patient is consuming approximately 100% of meals at this time. Labs and medications reviewed. No further nutrition interventions warranted at this time. RD contact information provided. If additional nutrition issues arise, please re-consult RD.   Vanessa Kick RD, LDN Clinical Nutrition Pager # [phone]

## 2018-02-20 NOTE — Progress Notes (Signed)
Removed Unna boots per order. Pt tolerated well. Will continue to monitor.

## 2018-02-20 NOTE — Progress Notes (Signed)
Benefit check in progress for NOAC; B Caidance Sybert RN,MHA,BSN [phone] 

## 2018-02-20 NOTE — Progress Notes (Signed)
ANTICOAGULATION CONSULT NOTE - Follow Up Consult  Pharmacy Consult for Coumadin Indication: atrial fibrillation  No Known Allergies  Patient Measurements: Height: 5' 8.5" (174 cm) Weight: 244 lb 1.6 oz (110.7 kg) IBW/kg (Calculated) : 69.55  Vital Signs: Temp: 97.8 F (36.6 C) (03/14 0410) Temp Source: Oral (03/14 0410) BP: 105/62 (03/14 1201) Pulse Rate: 63 (03/14 1201)  Labs: Recent Labs    02/18/18 2128 02/19/18 0525 02/20/18 0712  HGB 10.6* 10.6* 11.3*  HCT 32.6* 32.4* 34.5*  PLT 133* 134* 132*  LABPROT 28.9* 28.1* 31.7*  INR 2.75 2.65 3.10  CREATININE 1.24 1.05 1.08    Estimated Creatinine Clearance: 58.6 mL/min (by C-G formula based on SCr of 1.08 mg/dL).  Assessment:  Anticoag: AFib, warfarin PTA - INR today 2.65>3.1 today. Watch INR with new high-dose amiodarone (may not be long-term). - 2.5 mg every Monday/Thursday, 5 mg all other days -H/H low-stable, Plts 132 (appears baseline for this patient)  Goal of Therapy:  INR 2-3 Monitor platelets by anticoagulation protocol: Yes   Plan:  Coumadin 2.5mg  po x 1 due tonight Daily INR  Caira Poche S. Merilynn Finland, PharmD, BCPS Clinical Staff Pharmacist Pager [phone]  Misty Stanley Stillinger 02/20/2018,1:07 PM

## 2018-02-21 LAB — CBC WITH DIFFERENTIAL/PLATELET
Basophils Absolute: 0 10*3/uL (ref 0.0–0.1)
Basophils Relative: 0 %
EOS ABS: 0 10*3/uL (ref 0.0–0.7)
Eosinophils Relative: 0 %
HEMATOCRIT: 31.4 % — AB (ref 39.0–52.0)
HEMOGLOBIN: 10.4 g/dL — AB (ref 13.0–17.0)
LYMPHS ABS: 1.4 10*3/uL (ref 0.7–4.0)
Lymphocytes Relative: 13 %
MCH: 33.2 pg (ref 26.0–34.0)
MCHC: 33.1 g/dL (ref 30.0–36.0)
MCV: 100.3 fL — ABNORMAL HIGH (ref 78.0–100.0)
MONOS PCT: 11 %
Monocytes Absolute: 1.1 10*3/uL — ABNORMAL HIGH (ref 0.1–1.0)
NEUTROS PCT: 76 %
Neutro Abs: 8.3 10*3/uL — ABNORMAL HIGH (ref 1.7–7.7)
Platelets: 126 10*3/uL — ABNORMAL LOW (ref 150–400)
RBC: 3.13 MIL/uL — ABNORMAL LOW (ref 4.22–5.81)
RDW: 14.4 % (ref 11.5–15.5)
WBC: 10.8 10*3/uL — ABNORMAL HIGH (ref 4.0–10.5)

## 2018-02-21 LAB — GLUCOSE, CAPILLARY
GLUCOSE-CAPILLARY: 135 mg/dL — AB (ref 65–99)
Glucose-Capillary: 171 mg/dL — ABNORMAL HIGH (ref 65–99)
Glucose-Capillary: 137 mg/dL — ABNORMAL HIGH (ref 65–99)

## 2018-02-21 LAB — BASIC METABOLIC PANEL
Anion gap: 10 (ref 5–15)
BUN: 35 mg/dL — AB (ref 6–20)
CALCIUM: 8.7 mg/dL — AB (ref 8.9–10.3)
CHLORIDE: 99 mmol/L — AB (ref 101–111)
CO2: 28 mmol/L (ref 22–32)
CREATININE: 1.29 mg/dL — AB (ref 0.61–1.24)
GFR calc Af Amer: 56 mL/min — ABNORMAL LOW (ref >60)
GFR calc non Af Amer: 48 mL/min — ABNORMAL LOW (ref >60)
Glucose, Bld: 158 mg/dL — ABNORMAL HIGH (ref 65–99)
Potassium: 3.5 mmol/L (ref 3.5–5.1)
Sodium: 137 mmol/L (ref 135–145)

## 2018-02-21 LAB — PROTIME-INR
INR: 3.13
Prothrombin Time: 31.9 seconds — ABNORMAL HIGH (ref 11.4–15.2)

## 2018-02-21 LAB — MAGNESIUM: Magnesium: 1.7 mg/dL (ref 1.7–2.4)

## 2018-02-21 MED ORDER — WARFARIN SODIUM 2.5 MG PO TABS
2.5000 mg | ORAL_TABLET | Freq: Once | ORAL | Status: AC
  Administered 2018-02-21: 2.5 mg via ORAL
  Filled 2018-02-21: qty 1

## 2018-02-21 MED ORDER — FUROSEMIDE 80 MG PO TABS
80.0000 mg | ORAL_TABLET | Freq: Two times a day (BID) | ORAL | Status: DC
  Administered 2018-02-21 – 2018-02-23 (×4): 80 mg via ORAL
  Filled 2018-02-21 (×5): qty 1

## 2018-02-21 MED ORDER — POTASSIUM CHLORIDE CRYS ER 20 MEQ PO TBCR
40.0000 meq | EXTENDED_RELEASE_TABLET | Freq: Two times a day (BID) | ORAL | Status: DC
  Administered 2018-02-21 – 2018-02-23 (×4): 40 meq via ORAL
  Filled 2018-02-21 (×4): qty 2

## 2018-02-21 NOTE — Evaluation (Signed)
Physical Therapy Evaluation Patient Details Name: [name] MRN: [id number] DOB: [birth year] Today's Date: 02/21/2018   History of Present Illness  Pt is an 84 y.o. male admitted 02/18/18 for CHF. PMH includes OA, ICD, HTN, DM, a-fib, L TKA (2016).  Clinical Impression  Pt presents with an overall decrease in functional mobility secondary to above. Unsure pt reliable historian regarding PLOF; pt lives alone and reports mod indep with ADLs and amb with SPC; his daughter checks on him daily and "does everything I need," but I expect the pt has been requiring increasing assist levels, especially for household tasks. Today, pt required min-modA to stand with RW and close min guard to ambulate in room; 1x posterior LOB requiring minA to correct. Discussed recommendation for SNF-level therapies to maximize functional mobility and independence; pt states he is willing to consider this. Will follow acutely to address established goals.     Follow Up Recommendations SNF;Supervision for mobility/OOB    Equipment Recommendations  None recommended by PT    Recommendations for Other Services       Precautions / Restrictions Precautions Precautions: Fall Restrictions Weight Bearing Restrictions: No      Mobility  Bed Mobility Overal bed mobility: Needs Assistance Bed Mobility: Supine to Sit     Supine to sit: Min guard;HOB elevated     General bed mobility comments: Increased time and effort, but no physical assist required with HOB elevated (pt reports he sleeps in recliner)  Transfers Overall transfer level: Needs assistance Equipment used: Rolling walker (2 wheeled) Transfers: Sit to/from Stand Sit to Stand: Mod assist;From elevated surface         General transfer comment: Pt attempting to stand 2x, eventually requiring modA to stand from elevated bed height with RW. MinA to stand from raised toilet seat. Repeated cues for correct hand  palcement  Ambulation/Gait Ambulation/Gait assistance: Min guard;Min assist Ambulation Distance (Feet): 40 Feet Assistive device: Rolling walker (2 wheeled) Gait Pattern/deviations: Step-to pattern;Decreased stride length;Leaning posteriorly;Trunk flexed Gait velocity: Decreased Gait velocity interpretation: <1.8 ft/sec, indicative of risk for recurrent falls General Gait Details: Slow, unsteady amb with RW and close min guard for balance. 1x posterior LOB requiring minA to correct. Cues to maintain proximity to RW and upright posture, but pt remains significantly forward flexed  Stairs            Wheelchair Mobility    Modified Rankin (Stroke Patients Only)       Balance Overall balance assessment: Needs assistance   Sitting balance-Leahy Scale: Fair       Standing balance-Leahy Scale: Poor Standing balance comment: Reliant on UE support; 1x LOB requiring assist to correct                             Pertinent Vitals/Pain Pain Assessment: Faces Faces Pain Scale: Hurts a little bit Pain Location: Bilateral lower legs Pain Descriptors / Indicators: Sore Pain Intervention(s): Monitored during session    Home Living Family/patient expects to be discharged to:: Private residence Living Arrangements: Alone Available Help at Discharge: Family;Available PRN/intermittently Type of Home: House Home Access: Stairs to enter Entrance Stairs-Rails: Doctor, general practice of Steps: 5 Home Layout: One level Home Equipment: Walker - 2 wheels;Cane - single point Additional Comments: Daughter checks on patient daily    Prior Function Level of Independence: Needs assistance   Gait / Transfers Assistance Needed: Pt reports mod indep with use of SPC  ADL's / Homemaking  Assistance Needed: Pt reports indep with ADLs. Able to fix himself meals  Comments: Pt unreliable historian - expect daughter assist with more household tasks than he is letting on      Hand Dominance        Extremity/Trunk Assessment   Upper Extremity Assessment Upper Extremity Assessment: Generalized weakness    Lower Extremity Assessment Lower Extremity Assessment: Generalized weakness    Cervical / Trunk Assessment Cervical / Trunk Assessment: Kyphotic  Communication   Communication: HOH  Cognition Arousal/Alertness: Awake/alert Behavior During Therapy: WFL for tasks assessed/performed Overall Cognitive Status: No family/caregiver present to determine baseline cognitive functioning Area of Impairment: Attention;Memory;Following commands;Safety/judgement;Awareness;Problem solving                   Current Attention Level: Selective Memory: Decreased short-term memory Following Commands: Follows multi-step commands inconsistently Safety/Judgement: Decreased awareness of safety Awareness: Emergent Problem Solving: Requires verbal cues        General Comments      Exercises     Assessment/Plan    PT Assessment Patient needs continued PT services  PT Problem List Decreased strength;Decreased activity tolerance;Decreased balance;Decreased mobility;Decreased cognition;Decreased knowledge of use of DME;Decreased safety awareness;Decreased skin integrity       PT Treatment Interventions DME instruction;Gait training;Stair training;Functional mobility training;Therapeutic activities;Therapeutic exercise;Balance training;Patient/family education    PT Goals (Current goals can be found in the Care Plan section)  Acute Rehab PT Goals Patient Stated Goal: Feel better PT Goal Formulation: With patient Time For Goal Achievement: 03/07/18 Potential to Achieve Goals: Good    Frequency Min 2X/week   Barriers to discharge Decreased caregiver support      Co-evaluation               AM-PAC PT "6 Clicks" Daily Activity  Outcome Measure Difficulty turning over in bed (including adjusting bedclothes, sheets and blankets)?: A  Little Difficulty moving from lying on back to sitting on the side of the bed? : A Little Difficulty sitting down on and standing up from a chair with arms (e.g., wheelchair, bedside commode, etc,.)?: Unable Help needed moving to and from a bed to chair (including a wheelchair)?: A Little Help needed walking in hospital room?: A Little Help needed climbing 3-5 steps with a railing? : A Lot 6 Click Score: 15    End of Session Equipment Utilized During Treatment: Gait belt Activity Tolerance: Patient tolerated treatment well Patient left: in chair;with call bell/phone within reach;with chair alarm set Nurse Communication: Mobility status PT Visit Diagnosis: Other abnormalities of gait and mobility (R26.89)    Time: 6301-6010 PT Time Calculation (min) (ACUTE ONLY): 21 min   Charges:   PT Evaluation $PT Eval Moderate Complexity: 1 Mod     PT G Codes:       Ina Homes, PT, DPT Acute Rehab Services  Pager: [phone]  Malachy Chamber 02/21/2018, 9:26 AM

## 2018-02-21 NOTE — Progress Notes (Signed)
ANTICOAGULATION CONSULT NOTE - Follow Up Consult  Pharmacy Consult for Coumadin Indication: atrial fibrillation  No Known Allergies  Patient Measurements: Height: 5' 8.5" (174 cm) Weight: 238 lb 12.1 oz (108.3 kg) IBW/kg (Calculated) : 69.55  Vital Signs: Temp: 97.3 F (36.3 C) (03/15 0800) Temp Source: Oral (03/15 0800) BP: 109/54 (03/15 0800) Pulse Rate: 88 (03/15 0800)  Labs: Recent Labs    02/19/18 0525 02/20/18 0712 02/21/18 0731  HGB 10.6* 11.3* 10.4*  HCT 32.4* 34.5* 31.4*  PLT 134* 132* 126*  LABPROT 28.1* 31.7* 31.9*  INR 2.65 3.10 3.13  CREATININE 1.05 1.08 1.29*    Estimated Creatinine Clearance: 48.6 mL/min (A) (by C-G formula based on SCr of 1.29 mg/dL (H)).  Assessment:  Anticoag: AFib, warfarin PTA - INR today 2.65>3.13 today. Watch INR with new high-dose amiodarone (may not be long-term). -PTA: 2.5 mg every Monday/Thursday, 5 mg all other days -Hgb 11.3>10.4 (stable from previous), Plts 126 (appears baseline for this patient)  Goal of Therapy:  INR 2-3 Monitor platelets by anticoagulation protocol: Yes   Plan:  Coumadin 2.5mg  po x 1 tonight again Daily INR  [name] S. Merilynn Finland, PharmD, BCPS Clinical Staff Pharmacist Pager [phone]  [name] 02/21/2018,10:43 AM

## 2018-02-21 NOTE — Care Management Important Message (Signed)
Important Message  Patient Details  Name: [name] MRN: [id number] Date of Birth: February 11, 1929   Medicare Important Message Given:  Yes    Vaishali Baise P Gabrielly Mccrystal 02/21/2018, 3:33 PM

## 2018-02-21 NOTE — Progress Notes (Addendum)
Progress Note  Patient Name: [name] Date of Encounter: 02/21/2018  Primary Cardiologist: Olga Millers, MD   Subjective   Denies any CP or SOB.   Inpatient Medications    Scheduled Meds: . allopurinol  100 mg Oral Daily  . amiodarone  400 mg Oral BID  . atorvastatin  20 mg Oral Daily  . cholecalciferol  1,000 Units Oral Daily  . furosemide  80 mg Intravenous BID  . insulin aspart  0-20 Units Subcutaneous TID WC  . magnesium oxide  400 mg Oral BID  . metoprolol tartrate  25 mg Oral BID  . mometasone-formoterol  2 puff Inhalation BID  . potassium chloride  40 mEq Oral Daily   And  . potassium chloride  20 mEq Oral QPM  . sodium chloride flush  3 mL Intravenous Q12H  . tamsulosin  0.4 mg Oral BID  . warfarin  2.5 mg Oral ONCE-1800  . Warfarin - Pharmacist Dosing Inpatient   Does not apply q1800   Continuous Infusions: . sodium chloride     PRN Meds: sodium chloride, acetaminophen, ondansetron (ZOFRAN) IV, polyethylene glycol, sodium chloride flush   Vital Signs    Vitals:   02/21/18 0546 02/21/18 0800 02/21/18 0941 02/21/18 1200  BP: 103/63 (!) 109/54  92/64  Pulse: 74 88  81  Resp: 20 19  18   Temp: 98.4 F (36.9 C) (!) 97.3 F (36.3 C)  98.3 F (36.8 C)  TempSrc: Oral Oral  Oral  SpO2: 96% 98% 97% 100%  Weight: 238 lb 12.1 oz (108.3 kg)     Height:        Intake/Output Summary (Last 24 hours) at 02/21/2018 1305 Last data filed at 02/21/2018 0935 Gross per 24 hour  Intake 600 ml  Output 1400 ml  Net -800 ml   Filed Weights   02/19/18 0555 02/20/18 0410 02/21/18 0546  Weight: 250 lb 8 oz (113.6 kg) 244 lb 1.6 oz (110.7 kg) 238 lb 12.1 oz (108.3 kg)    Telemetry    Paced rhythm, occasional PVCs - Personally Reviewed  ECG    Ventricularly paced rhythm with PVCs - Personally Reviewed  Physical Exam   GEN: No acute distress.   Neck: No JVD Cardiac: RRR, no murmurs, rubs, or gallops.  Respiratory: Clear to auscultation  bilaterally. GI: Soft, nontender, non-distended  MS: No deformity. LE edema resolved Neuro:  Nonfocal  Psych: Normal affect   Labs    Chemistry Recent Labs  Lab 02/18/18 2128 02/19/18 0525 02/20/18 0712 02/21/18 0731  NA 137 137 137 137  K 4.4 3.6 3.7 3.5  CL 102 102 98* 99*  CO2 25 25 28 28   GLUCOSE 124* 129* 170* 158*  BUN 36* 33* 28* 35*  CREATININE 1.24 1.05 1.08 1.29*  CALCIUM 8.7* 8.9 9.0 8.7*  PROT 5.7*  --   --   --   ALBUMIN 3.2*  --   --   --   AST 23  --   --   --   ALT 19  --   --   --   ALKPHOS 78  --   --   --   BILITOT 0.6  --   --   --   GFRNONAA 50* >60 60* 48*  GFRAA 58* >60 >60 56*  ANIONGAP 10 10 11 10      Hematology Recent Labs  Lab 02/19/18 0525 02/20/18 0712 02/21/18 0731  WBC 5.8 9.9 10.8*  RBC 3.22* 3.46*  3.46*  3.13*  HGB 10.6* 11.3* 10.4*  HCT 32.4* 34.5* 31.4*  MCV 100.6* 99.7 100.3*  MCH 32.9 32.7 33.2  MCHC 32.7 32.8 33.1  RDW 14.4 14.1 14.4  PLT 134* 132* 126*    Cardiac EnzymesNo results for input(s): TROPONINI in the last 168 hours. No results for input(s): TROPIPOC in the last 168 hours.   BNP Recent Labs  Lab 02/18/18 2128  BNP 325.8*     DDimer No results for input(s): DDIMER in the last 168 hours.   Radiology    No results found.  Cardiac Studies   Echo 02/19/2018 LV EF: 40% -   45%  Study Conclusions  - Left ventricle: The cavity size was moderately dilated. Wall   thickness was increased in a pattern of moderate LVH.   Incoordinate septal motion. Systolic function was mildly to   moderately reduced. The estimated ejection fraction was in the   range of 40% to 45%. Diffuse hypokinesis. The study is not   technically sufficient to allow evaluation of LV diastolic   function. - Aortic valve: Mildly calcified leaflets. There was no stenosis.   There was no regurgitation. - Aorta: Aortic root dimension: 38 mm (ED). - Aortic root: The aortic root was top normal in size. - Mitral valve: Mildly  thickened leaflets . There was mild to   moderate regurgitation. - Left atrium: Severely dilated. The atrium was normal in size. - Right ventricle: The cavity size was normal. Wall thickness was   normal. AICD wire noted in right ventricle. Systolic function was   low normal. - Right atrium: Severely dilated. AICD wire noted in right atrium. - Tricuspid valve: There was trivial regurgitation. - Pulmonary arteries: PA peak pressure: 21 mm Hg (S) + RAP. - Systemic veins: Not visualized.  Impressions:  - Compared to a prior study in 2017, the LVEF is higher at 40-45%   with global hypokinesis and severe biatrial enlargement.   Patient Profile     84 y.o. male with PMH of NICM s/p BiV ICD, chronic combined systolic and diastolic HF, permanent atrial fibrillation, bradycardia, HTN, HLD and DM II admitted for acute CHF from clinic.   Assessment & Plan    1. Acute on chronic combined CHF  - admission weight 256 lbs, today's weight 238 lbs. He appears to be euvolemic, will transition to 80mg  BID PO lasix. Change KCl to BID  - plan for discharge tomorrow if able to tolerate current dose of diuretic. Ambulate today. Consider add lisinopril back tomorrow AM if BP stable. SBP soft this morning.      2. Frequent PVCs: amiodarone started for suppression of PVC. Started on magnesium supplement due to recurrent low mg. Still has occasional bigeminy on telemetry. Continue amiodarone 400mg  BID for 4 more days to complet a 1 week course, then decrease to 200mg  BID for 1 month, then 200 mg daily thereafter  3. Permanent atrial fibrillation with BiV PPM: per case manager 30 day supply of eliquis is $45, 90 day cost is $125. Discussed this with the patient, he wish to stay on coumadin for now. Daughter will discuss with coumadin more regarding NOAC. Will need to be set up with home health nurse to have coumadin checked at home.   4. CKD stage III: Creatinine trended up slightly.   5. HTN: BP soft  this morning, will transition the patient to PO lasix. Plan to start lisinopril back if SBP come up tomorrow.   6. HLD: continue lipitor.  7. DM II: SSI  For questions or updates, please contact [facility] Please consult [email or web address] for contact info under Cardiology/STEMI.      Ramond Dial, PA  02/21/2018, 1:05 PM    I have seen and examined the patient along with Azalee Course, PA .  I have reviewed the chart, notes and new data.  I agree with PA/NP's note.  Key new complaints: has diuresed at a rapid pace. Able to walk today. Weight 238 ids substantially less than previous office "dry" weight of 246 lb. Key examination changes: marked improvement in edema Key new findings / data: fewer nonpaced beats (loss of CRT) compared to yesterday. BP does not really allow more beta blocker. Creatinine inched up to 1.3. K borderline 3.5. Borderline high uric acid level. Labs c/w iron deficiency (mild anemia). Note normal LFT and TSH on current admission.   PLAN: Agree with transition to oral diuretics. Reviewed sodium restriction and weight monitoring. Would reestablish target dry weight at 240 lb or so. Watch carefully for amiodarone-warfarin interaction. Will ask home health and our Anticoagulation clinic to follow. Drop amiodarone dose to 400 mg daily in one week, 200 mg daily in one month. Hopefully, CRT efficiency will improve.  Thurmon Fair, MD, [facility] [phone] 02/21/2018, 1:55 PM

## 2018-02-21 NOTE — Progress Notes (Signed)
Benefit check for [name] [phone]   RE: Benefit check  Received: Yesterday  Message Contents  Dorena Bodo D CMA        Eliquis 5Mg  2X a day for 30 day is 45.00 copay  no prior auth 90 day supply 125.  No prior auth Xarelto 15 mg 125.  For 90 day supply and 45.00 for 30 day sypply no prior auth require

## 2018-02-21 NOTE — Care Management Note (Signed)
Case Management Note  Patient Details  Name: [name] MRN: [id number] Date of Birth: [birth year]  Action/Plan: CM talked to patient again for [facility]; noted patient will be going home on coumadin, needs [facility] for INR checks; Attending MD please document when you would like the [facility] to draw labs after discharge; HHC offered, pt chose [facility]; [place] with [facility] called for arrangements.  Expected Discharge Date:  Possibly 02/22/2018              Expected Discharge Plan:  Home w Home Health Services  Discharge planning Services  CM Consult Choice offered to:  Patient  [facility] Arranged:  HHRN HH Agency:   [facility]  Status of Service:   In progress  Reola Mosher [phone] 02/21/2018, 3:43 PM

## 2018-02-22 LAB — BASIC METABOLIC PANEL
Anion gap: 11 (ref 5–15)
BUN: 37 mg/dL — AB (ref 6–20)
CHLORIDE: 95 mmol/L — AB (ref 101–111)
CO2: 29 mmol/L (ref 22–32)
CREATININE: 1.24 mg/dL (ref 0.61–1.24)
Calcium: 8.8 mg/dL — ABNORMAL LOW (ref 8.9–10.3)
GFR calc Af Amer: 58 mL/min — ABNORMAL LOW (ref >60)
GFR, EST NON AFRICAN AMERICAN: 50 mL/min — AB (ref >60)
GLUCOSE: 138 mg/dL — AB (ref 65–99)
Potassium: 3.9 mmol/L (ref 3.5–5.1)
SODIUM: 135 mmol/L (ref 135–145)

## 2018-02-22 LAB — CBC WITH DIFFERENTIAL/PLATELET
Basophils Absolute: 0 10*3/uL (ref 0.0–0.1)
Basophils Relative: 0 %
EOS ABS: 0.1 10*3/uL (ref 0.0–0.7)
EOS PCT: 1 %
HCT: 32.6 % — ABNORMAL LOW (ref 39.0–52.0)
Hemoglobin: 10.7 g/dL — ABNORMAL LOW (ref 13.0–17.0)
LYMPHS ABS: 1.3 10*3/uL (ref 0.7–4.0)
LYMPHS PCT: 14 %
MCH: 32.6 pg (ref 26.0–34.0)
MCHC: 32.8 g/dL (ref 30.0–36.0)
MCV: 99.4 fL (ref 78.0–100.0)
MONO ABS: 1 10*3/uL (ref 0.1–1.0)
MONOS PCT: 11 %
Neutro Abs: 7.3 10*3/uL (ref 1.7–7.7)
Neutrophils Relative %: 74 %
PLATELETS: 127 10*3/uL — AB (ref 150–400)
RBC: 3.28 MIL/uL — ABNORMAL LOW (ref 4.22–5.81)
RDW: 13.9 % (ref 11.5–15.5)
WBC: 9.8 10*3/uL (ref 4.0–10.5)

## 2018-02-22 LAB — GLUCOSE, CAPILLARY
GLUCOSE-CAPILLARY: 193 mg/dL — AB (ref 65–99)
GLUCOSE-CAPILLARY: 181 mg/dL — AB (ref 65–99)
GLUCOSE-CAPILLARY: 154 mg/dL — AB (ref 65–99)
Glucose-Capillary: 173 mg/dL — ABNORMAL HIGH (ref 65–99)

## 2018-02-22 LAB — MAGNESIUM: Magnesium: 1.5 mg/dL — ABNORMAL LOW (ref 1.7–2.4)

## 2018-02-22 LAB — PROTIME-INR
INR: 2.79
PROTHROMBIN TIME: 29.2 s — AB (ref 11.4–15.2)

## 2018-02-22 MED ORDER — WARFARIN SODIUM 2.5 MG PO TABS
2.5000 mg | ORAL_TABLET | Freq: Once | ORAL | Status: AC
  Administered 2018-02-22: 2.5 mg via ORAL
  Filled 2018-02-22: qty 1

## 2018-02-22 MED ORDER — LISINOPRIL 5 MG PO TABS
2.5000 mg | ORAL_TABLET | Freq: Every day | ORAL | Status: DC
  Administered 2018-02-22 – 2018-02-23 (×2): 2.5 mg via ORAL
  Filled 2018-02-22 (×2): qty 1

## 2018-02-22 NOTE — Clinical Social Work Note (Signed)
Clinical Social Worker met with patient and patient daughter at bedside to offer support and discuss patient needs at discharge.  Patient and daughter opposed to SNF placement at this time and prefer to see how patient will manage at home.  Patient and patient daughter are agreeable to home health and RNCM has already made arrangements.  CSW spoke with family about adding a home health CSW in order to place patient from home if SNF becomes necessary - family agreeable and RNCM to arrange.  Clinical Social Worker will sign off for now as social work intervention is no longer needed. Please consult [place] again if new need arises.  Barbette Or, [place]

## 2018-02-22 NOTE — Progress Notes (Signed)
Patient requests to put on his shoes and use his cane before he goes home to make sure he will be able to manage getting into the house with his cane upon discharge. He states that he can not use a walker going into the house because he has steps and [place]. Patient says he can only use a walker inside the house.

## 2018-02-22 NOTE — Progress Notes (Signed)
Subjective:  His breathing is better today however he still is weak and does not feel as if he can go home or navigate his stairs at home.  Still having PVCs.  Breathing is better and weight has gone down significantly although is up a little bit today.  Objective:  Vital Signs in the last 24 hours: BP (!) 107/55   Pulse (!) 101   Temp 98.4 F (36.9 C) (Oral)   Resp 20   Ht 5' 8.5" (1.74 m)   Wt 110.4 kg (243 lb 6.2 oz)   SpO2 100%   BMI 36.47 kg/m   Physical Exam:   Obese elderly male currently in no acute distress Lungs:  Clear  Cardiac:  irregular rhythm, normal S1 and S2, no S3, and 1 to 2/6 murmur Abdomen:  Soft, nontender, no masses Extremities:  One plus edema present  Intake/Output from previous day: 03/15 0701 - 03/16 0700 In: 1320 [P.O.:1320] Out: 1625 [Urine:1625] Weight Filed Weights   02/20/18 0410 02/21/18 0546 02/22/18 0622  Weight: 110.7 kg (244 lb 1.6 oz) 108.3 kg (238 lb 12.1 oz) 110.4 kg (243 lb 6.2 oz)    Lab Results: Basic Metabolic Panel: Recent Labs    02/21/18 0731 02/22/18 0433  NA 137 135  K 3.5 3.9  CL 99* 95*  CO2 28 29  GLUCOSE 158* 138*  BUN 35* 37*  CREATININE 1.29* 1.24    CBC: Recent Labs    02/21/18 0731 02/22/18 0433  WBC 10.8* 9.8  NEUTROABS 8.3* 7.3  HGB 10.4* 10.7*  HCT 31.4* 32.6*  MCV 100.3* 99.4  PLT 126* 127*    BNP    Component Value Date/Time   BNP 325.8 (H) 02/18/2018 2128   BNP 209.3 (H) 10/15/2016 1444   PROTIME: Lab Results  Component Value Date   INR 2.79 02/22/2018   INR 3.13 02/21/2018   INR 3.10 02/20/2018    Telemetry: Paced rhythm with bigeminal PVCs  Assessment/Plan:  1.  Acute on combine chronic systolic heart failure-will need one more day to gain strength and to deal with weakness 2.  Functioning biventricular pacemaker 3.  Chronic kidney disease stable 4.PVCs currently on amiodarone for suppression although continues with bigeminy  Recommendations:  See how he does on by  mouth Lasix.ambulation and try to gain strength.  I think we'll try a low-dose of lisinopril this morning.     Darden Palmer  MD [facility] Cardiology  02/22/2018, 12:11 PM

## 2018-02-22 NOTE — Progress Notes (Signed)
ANTICOAGULATION CONSULT NOTE - Follow Up Consult  Pharmacy Consult for Coumadin Indication: atrial fibrillation  No Known Allergies  Patient Measurements: Height: 5' 8.5" (174 cm) Weight: 243 lb 6.2 oz (110.4 kg) IBW/kg (Calculated) : 69.55  Vital Signs: BP: 107/55 (03/16 1011) Pulse Rate: 101 (03/16 1011)  Labs: Recent Labs    02/20/18 0712 02/21/18 0731 02/22/18 0433  HGB 11.3* 10.4* 10.7*  HCT 34.5* 31.4* 32.6*  PLT 132* 126* 127*  LABPROT 31.7* 31.9* 29.2*  INR 3.10 3.13 2.79  CREATININE 1.08 1.29* 1.24    Estimated Creatinine Clearance: 51 mL/min (by C-G formula based on SCr of 1.24 mg/dL).  Assessment:   84yo admitted with CHF exacerbation.  He was on anticoagulation with Warfarin PTA for afib.  We were asked to assist with dosing while he is hospitalized.   -PTA: 2.5 mg every Monday/Thursday, 5 mg all other days  -Hgb 11.3>10.7 (stable from previous), Plts 127 (appears baseline for this patient) - Today his INR is 2.79 after 2.5mg  dose yesterday.    Drug/Drug Interaction Potentials: 1.  Allopurinol + Warfarin - inc. INR, risk of bleeding 2.  Amiodarone + Warfarin - inc. INR, risk of bleeding   Goal of Therapy:  INR 2-3 Monitor platelets by anticoagulation protocol: Yes   Plan:  Coumadin 2.5mg  po x 1 tonight again unless discharged Daily INR  Nadara Mustard, PharmD., MS Clinical Pharmacist Pager:  [phone] Thank you for allowing pharmacy to be part of this patients care team. 02/22/2018,11:12 AM

## 2018-02-22 NOTE — Plan of Care (Signed)
  Adequate for Discharge Health Behavior/Discharge Planning: Ability to manage health-related needs will improve 02/22/2018 0115 - Adequate for Discharge by Jeanella Flattery, RN Clinical Measurements: Ability to maintain clinical measurements within normal limits will improve 02/22/2018 0115 - Adequate for Discharge by Jeanella Flattery, RN Respiratory complications will improve 02/22/2018 0115 - Adequate for Discharge by Jeanella Flattery, RN

## 2018-02-22 NOTE — Progress Notes (Signed)
Assisted pt with ambulating to bathroom with the help of another RN and NT. Offered pt a bath, he stated that he had already been bathed today. Pt refused bath. Full linen change performed by NT. Pt ambulated in the hall with cane with RN.

## 2018-02-23 DIAGNOSIS — I498 Other specified cardiac arrhythmias: Secondary | ICD-10-CM

## 2018-02-23 DIAGNOSIS — I499 Cardiac arrhythmia, unspecified: Secondary | ICD-10-CM

## 2018-02-23 LAB — CBC WITH DIFFERENTIAL/PLATELET
BASOS ABS: 0 10*3/uL (ref 0.0–0.1)
Basophils Relative: 0 %
Eosinophils Absolute: 0 10*3/uL (ref 0.0–0.7)
Eosinophils Relative: 0 %
HCT: 33.3 % — ABNORMAL LOW (ref 39.0–52.0)
HEMOGLOBIN: 11 g/dL — AB (ref 13.0–17.0)
LYMPHS PCT: 8 %
Lymphs Abs: 0.7 10*3/uL (ref 0.7–4.0)
MCH: 32.5 pg (ref 26.0–34.0)
MCHC: 33 g/dL (ref 30.0–36.0)
MCV: 98.5 fL (ref 78.0–100.0)
MONO ABS: 0.4 10*3/uL (ref 0.1–1.0)
MONOS PCT: 5 %
NEUTROS ABS: 7.6 10*3/uL (ref 1.7–7.7)
NEUTROS PCT: 87 %
Platelets: 138 10*3/uL — ABNORMAL LOW (ref 150–400)
RBC: 3.38 MIL/uL — ABNORMAL LOW (ref 4.22–5.81)
RDW: 13.8 % (ref 11.5–15.5)
WBC: 8.7 10*3/uL (ref 4.0–10.5)

## 2018-02-23 LAB — PROTIME-INR
INR: 2.16
Prothrombin Time: 23.9 seconds — ABNORMAL HIGH (ref 11.4–15.2)

## 2018-02-23 LAB — BASIC METABOLIC PANEL
ANION GAP: 11 (ref 5–15)
BUN: 48 mg/dL — ABNORMAL HIGH (ref 6–20)
CALCIUM: 8.7 mg/dL — AB (ref 8.9–10.3)
CHLORIDE: 96 mmol/L — AB (ref 101–111)
CO2: 27 mmol/L (ref 22–32)
Creatinine, Ser: 1.39 mg/dL — ABNORMAL HIGH (ref 0.61–1.24)
GFR calc Af Amer: 51 mL/min — ABNORMAL LOW (ref >60)
GFR calc non Af Amer: 44 mL/min — ABNORMAL LOW (ref >60)
GLUCOSE: 170 mg/dL — AB (ref 65–99)
Potassium: 4.3 mmol/L (ref 3.5–5.1)
Sodium: 134 mmol/L — ABNORMAL LOW (ref 135–145)

## 2018-02-23 LAB — GLUCOSE, CAPILLARY
GLUCOSE-CAPILLARY: 139 mg/dL — AB (ref 65–99)
Glucose-Capillary: 192 mg/dL — ABNORMAL HIGH (ref 65–99)

## 2018-02-23 MED ORDER — WARFARIN SODIUM 5 MG PO TABS: 2.5000 mg | ORAL_TABLET | Freq: Once | ORAL | 1 refills | Status: DC

## 2018-02-23 MED ORDER — AMIODARONE HCL 200 MG PO TABS: ORAL_TABLET | ORAL | 3 refills | Status: DC

## 2018-02-23 MED ORDER — WARFARIN SODIUM 5 MG PO TABS: 5.0000 mg | ORAL_TABLET | Freq: Once | ORAL | Status: DC

## 2018-02-23 MED ORDER — MAGNESIUM OXIDE 400 (241.3 MG) MG PO TABS: 400.0000 mg | ORAL_TABLET | Freq: Two times a day (BID) | ORAL | 3 refills | Status: DC

## 2018-02-23 MED ORDER — POTASSIUM CHLORIDE CRYS ER 20 MEQ PO TBCR: 40.0000 meq | EXTENDED_RELEASE_TABLET | Freq: Two times a day (BID) | ORAL | 2 refills | Status: DC

## 2018-02-23 MED ORDER — LISINOPRIL 5 MG PO TABS: 2.5000 mg | ORAL_TABLET | Freq: Every day | ORAL | 3 refills | Status: DC

## 2018-02-23 MED ORDER — METOPROLOL TARTRATE 25 MG PO TABS: 25.0000 mg | ORAL_TABLET | Freq: Two times a day (BID) | ORAL | 3 refills | Status: DC

## 2018-02-23 MED ORDER — SPIRONOLACTONE 25 MG PO TABS: 12.5000 mg | ORAL_TABLET | Freq: Every day | ORAL | Status: DC

## 2018-02-23 MED ORDER — FUROSEMIDE 80 MG PO TABS: 80.0000 mg | ORAL_TABLET | Freq: Two times a day (BID) | ORAL | 3 refills | Status: DC

## 2018-02-23 NOTE — Discharge Summary (Addendum)
Discharge Summary    Patient ID: [name],  MRN: [id number], DOB/AGE: [birth year] 84 y.o.  Admit date: 02/18/2018 Discharge date: 02/23/2018   Primary Care Provider: Romero Belling Primary Cardiologist: Dr. Jens Som  Discharge Diagnoses    Principal Problem:   CHF (congestive heart failure) (HCC) Active Problems:   Dyslipidemia   Essential hypertension   Atrial fibrillation (HCC)   Bradycardia   Bigeminy   Allergies No Known Allergies   History of Present Illness     [name] is a 84 y.o. male with PMH of NICM s/p BiV ICD, chronic combined systolic and diastolic HF, permanent atrial fibrillation, bradycardia, HTN, HLD and DM II. He had a cardiac cath in 12/2004 that showed no CAD, EF 40%. Myoview on 05/04/2008 showed EF 31%, prior infarct with mild peri-infarct ischemia. This was reviewed by Dr. Jens Som and felt to be low risk. Holter in 07/2010 showed mildly reduced HR, coreg reduced. He had BiV ICD implanted in 10/2012, this was later downgraded to BiV PPM 07/2016. Echo in 06/2016 showed EF 35-40%, mild LAE.  Azalee Course last saw the patient on 02/10/2018, he had significant lower extremity edema at the time after his metolazone and spironolactone were discontinued on 01/14/2018 after basic metabolic panel showed creatinine went up to 1.8.  Prior to that, he was on 20 mg daily of Lasix, increase the Lasix to 40 mg twice daily for 2 days before going back to 40 mg daily thereafter. One week basic metabolic panel shows stable renal function is stable.  Patient presents today along with his daughter for re-visit, his weight is actually increased by 4 pounds since last week and a total of 14 pounds since January.  He continued to have worsening lower extremity edema.  He did mention after his diuretic was initially increased, he did have increased urinary output, however it is not enough to remove the amount of fluid.  He denies any orthopnea or PND.  His lung is clear on  physical exam.  At this point I am not confident we can remove his excess edema as outpatient.  I have discussed the case with DOD Dr. Chilton Si who agrees with the plan for direct admission and aggressive IV diuresis.  He will need a repeat echocardiogram.  Once his lower extremity edema improved, will also obtain a venous Doppler as well.   Hospital Course     Consultants: EP  Acute on chronic systolic heart failure Pt was directly admitted to [facility] for diuresis. He diuresed on 80 mg IV lasix BID.  Echo this admission with left ventricular ejection fraction of 40-45%, diffuse hypokinesis.  Mild to moderate mitral regurgitation, left atrial enlargement.  He is overall net -8 L with 1.6 L urine output yesterday.  His weight today is 243 pounds, down from 256 pounds on admission.  He was discharged on 80 mg Lasix twice daily.  Serum creatinine was 1.39 with K4.3.  Baseline creatinine may be between 1.37 and 1.85.  Will discharge on 40 mEq of K-Dur twice daily, but will need close BMP follow-up in outpatient setting. Spironolactone was D/C'ed for now due to marginal pressures.  Consider resuming spironolactone in the outpatient clinic.  Consider decreasing potassium supplementation at that time.   EP consulted for persistent bigeminy resulting in decreased CRT pacing.  Up-titration of beta blocker was limited by hypotension. Amiodarone started to suppress PVCs with caution given ongoing coumadin use.  Magnesium replaced during hospitalization.  He was  discharged on amiodarone 400 mg twice daily for a total of 1 week, then decrease to 200 mg amiodarone twice daily for 30 days, then decrease to 200 mg daily.  Permanent Afib Continue warfarin. CHADS2VASC of at least 5.  He has been maintained on 2.5 mg Coumadin with therapeutic INR.  His INR today is 2.16.  Given his concurrent allopurinol and amiodarone, will conservatively discharged on 2.5 mg daily Coumadin and he will have a close follow-up in INR  clinic.  Staff message sent to anticoagulation pool.  Case management is working on home health RN to draw INR labs.  He should have an INR and BMP drawn on Tuesday 02/25/2018.   Patient seen and examined by Dr. Donnie Aho today and was stable for discharge. All follow up has been arranged.  _____________  Discharge Vitals Blood pressure (!) 111/58, pulse 94, temperature 98.3 F (36.8 C), temperature source Oral, resp. rate 18, height 5' 8.5" (1.74 m), weight 243 lb 6.4 oz (110.4 kg), SpO2 95 %.  Filed Weights   02/21/18 0546 02/22/18 0622 02/23/18 0454  Weight: 238 lb 12.1 oz (108.3 kg) 243 lb 6.2 oz (110.4 kg) 243 lb 6.4 oz (110.4 kg)    Labs & Radiologic Studies    CBC Recent Labs    02/22/18 0433 02/23/18 0650  WBC 9.8 8.7  NEUTROABS 7.3 7.6  HGB 10.7* 11.0*  HCT 32.6* 33.3*  MCV 99.4 98.5  PLT 127* 138*   Basic Metabolic Panel Recent Labs    16/10/96 0731 02/22/18 0433 02/23/18 0650  NA 137 135 134*  K 3.5 3.9 4.3  CL 99* 95* 96*  CO2 28 29 27   GLUCOSE 158* 138* 170*  BUN 35* 37* 48*  CREATININE 1.29* 1.24 1.39*  CALCIUM 8.7* 8.8* 8.7*  MG 1.7 1.5*  --    Liver Function Tests No results for input(s): AST, ALT, ALKPHOS, BILITOT, PROT, ALBUMIN in the last 72 hours. No results for input(s): LIPASE, AMYLASE in the last 72 hours. Cardiac Enzymes No results for input(s): CKTOTAL, CKMB, CKMBINDEX, TROPONINI in the last 72 hours. BNP Invalid input(s): POCBNP D-Dimer No results for input(s): DDIMER in the last 72 hours. Hemoglobin A1C No results for input(s): HGBA1C in the last 72 hours. Fasting Lipid Panel No results for input(s): CHOL, HDL, LDLCALC, TRIG, CHOLHDL, LDLDIRECT in the last 72 hours. Thyroid Function Tests No results for input(s): TSH, T4TOTAL, T3FREE, THYROIDAB in the last 72 hours.  Invalid input(s): FREET3 _____________  Dg Chest 2 View  Result Date: 02/19/2018 CLINICAL DATA:  Short of breath, heart failure EXAM: CHEST - 2 VIEW COMPARISON:   12/18/2017 FINDINGS: Hypoventilation with bibasilar atelectasis, slightly increased from the prior study. Negative for heart failure or edema. Negative for effusion. AICD unchanged in position IMPRESSION: Hypoventilation with bibasilar atelectasis. Electronically Signed   By: Marlan Palau M.D.   On: 02/19/2018 08:28     Diagnostic Studies/Procedures    Echo 02/19/18: Study Conclusions  - Left ventricle: The cavity size was moderately dilated. Wall   thickness was increased in a pattern of moderate LVH.   Incoordinate septal motion. Systolic function was mildly to   moderately reduced. The estimated ejection fraction was in the   range of 40% to 45%. Diffuse hypokinesis. The study is not   technically sufficient to allow evaluation of LV diastolic   function. - Aortic valve: Mildly calcified leaflets. There was no stenosis.   There was no regurgitation. - Aorta: Aortic root dimension: 38 mm (ED). -  Aortic root: The aortic root was top normal in size. - Mitral valve: Mildly thickened leaflets . There was mild to   moderate regurgitation. - Left atrium: Severely dilated. The atrium was normal in size. - Right ventricle: The cavity size was normal. Wall thickness was   normal. AICD wire noted in right ventricle. Systolic function was   low normal. - Right atrium: Severely dilated. AICD wire noted in right atrium. - Tricuspid valve: There was trivial regurgitation. - Pulmonary arteries: PA peak pressure: 21 mm Hg (S) + RAP. - Systemic veins: Not visualized.  Impressions:  - Compared to a prior study in 2017, the LVEF is higher at 40-45%     Disposition   Pt is being discharged home today in good condition.  Follow-up Plans & Appointments    Follow-up Information    [facility] Follow up.   Specialty:  Home Health Services Why:  A home health nurse will go to your home Contact information: [address] [place] [address] [phone]         Lewayne Bunting, MD Follow up in 1 week(s).   Specialty:  Cardiology Contact information: [address] [place] [address] [phone]        [facility] [address] Follow up in 2 day(s).   Specialty:  Cardiology Why:  INR check on Tues Contact information: [address], [address] [place] [address] [phone]         Discharge Instructions    Diet - low sodium heart healthy   Complete by:  As directed    Increase activity slowly   Complete by:  As directed       Discharge Medications   Allergies as of 02/23/2018   No Known Allergies     Medication List    STOP taking these medications   carvedilol 12.5 MG tablet Commonly known as:  COREG   spironolactone 25 MG tablet Commonly known as:  ALDACTONE     TAKE these medications   acetaminophen 650 MG CR tablet Commonly known as:  TYLENOL Take 650-1,300 mg by mouth every 8 (eight) hours as needed for pain.   allopurinol 100 MG tablet Commonly known as:  ZYLOPRIM TAKE 1 TABLET EVERY DAY What changed:    how much to take  how to take this  when to take this   amiodarone 200 MG tablet Commonly known as:  PACERONE Take 2 tablets (400 mg) twice daily for 3 days. Then decrease to 1 tablet (200 mg) twice daily for 30 days. Then decrease to 1 tablet (200 mg) daily.   atorvastatin 20 MG tablet Commonly known as:  LIPITOR TAKE 1 TABLET EVERY DAY What changed:    how much to take  how to take this  when to take this   cholecalciferol 1000 units tablet Commonly known as:  VITAMIN D Take 1,000 Units by mouth daily.   furosemide 80 MG tablet Commonly known as:  LASIX Take 1 tablet (80 mg total) by mouth 2 (two) times daily. What changed:    medication strength  how much to take  how to take this  when to take this  additional instructions   lisinopril 5 MG tablet Commonly known as:  PRINIVIL,ZESTRIL Take 0.5 tablets (2.5 mg total) by mouth  daily. Start taking on:  02/24/2018 What changed:    medication strength  how much to take   magnesium oxide 400 (241.3 Mg) MG  tablet Commonly known as:  MAG-OX Take 1 tablet (400 mg total) by mouth 2 (two) times daily.   metoprolol tartrate 25 MG tablet Commonly known as:  LOPRESSOR Take 1 tablet (25 mg total) by mouth 2 (two) times daily.   polyethylene glycol packet Commonly known as:  MIRALAX / GLYCOLAX Take 17 g by mouth daily as needed (constipation).   potassium chloride SA 20 MEQ tablet Commonly known as:  K-DUR,KLOR-CON Take 2 tablets (40 mEq total) by mouth 2 (two) times daily. What changed:    how much to take  how to take this  when to take this  additional instructions   tamsulosin 0.4 MG Caps capsule Commonly known as:  FLOMAX TAKE 1 CAPSULE TWICE DAILY What changed:    how much to take  how to take this  when to take this   warfarin 5 MG tablet Commonly known as:  COUMADIN Take as directed. If you are unsure how to take this medication, talk to your nurse or doctor. Original instructions:  Take 0.5 tablets (2.5 mg total) by mouth one time only at 6 PM. What changed:  See the new instructions.         Outstanding Labs/Studies   INR check and BMP on Tues, possibly with HHRN TCM with Dr. Jens Som * Needs an EP follow up appt made *  Duration of Discharge Encounter   Greater than 30 minutes including physician time.  Signed, Roe Rutherford Jenna Ardoin PA-C 02/23/2018, 12:30 PM

## 2018-02-23 NOTE — Progress Notes (Signed)
Subjective:  Weakness is improved.  Not currently short of breath at rest.  PVCs have gotten better and his ventricular rate is 60.  With this heart failure he might do better with a faster heart rate and that can be programmed as an outpatient to 70.  Objective:  Vital Signs in the last 24 hours: BP (!) 111/58 (BP Location: Right Arm)   Pulse 94   Temp 98.3 F (36.8 C) (Oral)   Resp 18   Ht 5' 8.5" (1.74 m)   Wt 110.4 kg (243 lb 6.4 oz) Comment: scale a  SpO2 95%   BMI 36.47 kg/m   Physical Exam:  Obese elderly male currently in no acute distress Lungs:  Clear  Cardiac:  regular rhythm, normal S1 and S2, no S3, and 1 to 2/6 murmur Abdomen:  Soft, nontender, no masses Extremities:  One plus edema present, marked chronic venous stasis changes noted.  Intake/Output from previous day: 03/16 0701 - 03/17 0700 In: 720 [P.O.:720] Out: 1600 [Urine:1600] Weight Filed Weights   02/21/18 0546 02/22/18 0622 02/23/18 0454  Weight: 108.3 kg (238 lb 12.1 oz) 110.4 kg (243 lb 6.2 oz) 110.4 kg (243 lb 6.4 oz)    Lab Results: Basic Metabolic Panel: Recent Labs    02/22/18 0433 02/23/18 0650  NA 135 134*  K 3.9 4.3  CL 95* 96*  CO2 29 27  GLUCOSE 138* 170*  BUN 37* 48*  CREATININE 1.24 1.39*    CBC: Recent Labs    02/22/18 0433 02/23/18 0650  WBC 9.8 8.7  NEUTROABS 7.3 7.6  HGB 10.7* 11.0*  HCT 32.6* 33.3*  MCV 99.4 98.5  PLT 127* 138*    BNP    Component Value Date/Time   BNP 325.8 (H) 02/18/2018 2128   BNP 209.3 (H) 10/15/2016 1444   PROTIME: Lab Results  Component Value Date   INR 2.16 02/23/2018   INR 2.79 02/22/2018   INR 3.13 02/21/2018    Telemetry: Paced rhythm rate of 60 with reduction in PVCs  Assessment/Plan:  1.  Acute on combine chronic systolic heart failure-will need one more day to gain strength and to deal with weakness 2.  Functioning biventricular pacemaker 3.  Chronic kidney disease with mild increase in creatinine 4. PVCs currently  on amiodarone for suppression although continues with bigeminy  Recommendations:  He is on a low-dose of lisinopril.  He will need an early follow-up with Dr. Jens Som to monitor his renal function.  He will need an early follow-up with the warfarin clinic.  He should be discharged on 400 mg twice a day of amiodarone and then dropped to 400 mg in a week and 200 mg daily in a month.  Continue current oral Lasix dose.  Consider giving a faster heart rate as an outpatient.    Darden Palmer  MD [facility]  02/23/2018, 11:09 AM

## 2018-02-23 NOTE — Progress Notes (Signed)
Patient rested well last night.  

## 2018-02-23 NOTE — Care Management Note (Signed)
Case Management Note Original Note Created Reola Mosher [phone] 02/21/2018, 3:43 PM   Patient Details  Name: [name] MRN: [id number] Date of Birth: July 14, 1929  Action/Plan: CM talked to patient again for [facility]; noted patient will be going home on coumadin, needs [facility] for INR checks; Attending MD please document when you would like the [facility] to draw labs after discharge; HHC offered, [facility]; [place] with [facility] called for arrangements.  Expected Discharge Date:   02/23/2018              Expected Discharge Plan:  Home w Home Health Services  Discharge planning Services  CM Consult Choice offered to:  Patient  [facility] Arranged:  HHRN HH Agency:  [facility]  Status of Service:  Completed, signed offIn progress  If discussed at [employer], dates discussed:    Discharge Disposition: home/home health  Additional Comments:  02/23/18- 1230- Steven Sperling RN, CM- pt for d/c home today- per D/C summary INR will need to be 3/19 along with Bmet- have updated HH order to reflect- and have spoken with Laurelyn Sickle at [facility] regarding lab needs and pt discharge. [facility] will f/u with [facility] needs.   Darrold Span, RN 02/23/2018, 12:29 PM 3E weekend coverage [phone]

## 2018-02-23 NOTE — Progress Notes (Signed)
ANTICOAGULATION CONSULT NOTE - Follow Up Consult  Pharmacy Consult for Coumadin Indication: atrial fibrillation  No Known Allergies  Patient Measurements: Height: 5' 8.5" (174 cm) Weight: 243 lb 6.4 oz (110.4 kg)(scale a) IBW/kg (Calculated) : 69.55  Labs: Recent Labs    02/21/18 0731 02/22/18 0433 02/23/18 0650  HGB 10.4* 10.7* 11.0*  HCT 31.4* 32.6* 33.3*  PLT 126* 127* 138*  LABPROT 31.9* 29.2* 23.9*  INR 3.13 2.79 2.16  CREATININE 1.29* 1.24 1.39*    Estimated Creatinine Clearance: 45.5 mL/min (A) (by C-G formula based on SCr of 1.39 mg/dL (H)).  Assessment:   84yo admitted with CHF exacerbation.  He was on anticoagulation with Warfarin PTA for afib.  We were asked to assist with dosing while he is hospitalized.    -PTA: 2.5 mg every Monday/Thursday, 5 mg all other days  -Hgb 11.0, Plts 138 (trending up) - Today his INR is 2.16 trending down   Drug/Drug Interaction Potentials: 1.  Allopurinol + Warfarin - inc. INR, risk of bleeding 2.  Amiodarone + Warfarin - inc. INR, risk of bleeding   Goal of Therapy:  INR 2-3 Monitor platelets by anticoagulation protocol: Yes   Plan:  Coumadin 5mg  po x 1 tonight - may be able to start him back on home regimen Daily INR, CBC q72hr  Nadara Mustard, PharmD., MS Clinical Pharmacist Pager:  [phone] Thank you for allowing pharmacy to be part of this patients care team. 02/23/2018,11:25 AM

## 2018-02-24 LAB — GLUCOSE, CAPILLARY: Glucose-Capillary: 132 mg/dL — ABNORMAL HIGH (ref 65–99)

## 2018-02-25 ENCOUNTER — Ambulatory Visit (INDEPENDENT_AMBULATORY_CARE_PROVIDER_SITE_OTHER): Payer: Medicare HMO | Admitting: *Deleted

## 2018-02-25 ENCOUNTER — Telehealth: Payer: Self-pay | Admitting: Cardiology

## 2018-02-25 ENCOUNTER — Encounter: Payer: Medicare HMO | Admitting: Internal Medicine

## 2018-02-25 DIAGNOSIS — I428 Other cardiomyopathies: Secondary | ICD-10-CM | POA: Diagnosis not present

## 2018-02-25 NOTE — Telephone Encounter (Signed)
Confirmed remote transmission w/ pt daughter.   

## 2018-02-26 ENCOUNTER — Encounter: Payer: Self-pay | Admitting: Cardiology

## 2018-02-26 ENCOUNTER — Telehealth: Payer: Self-pay | Admitting: Endocrinology

## 2018-02-26 ENCOUNTER — Telehealth: Payer: Self-pay | Admitting: Cardiology

## 2018-02-26 DIAGNOSIS — I5043 Acute on chronic combined systolic (congestive) and diastolic (congestive) heart failure: Secondary | ICD-10-CM | POA: Diagnosis not present

## 2018-02-26 DIAGNOSIS — I482 Chronic atrial fibrillation: Secondary | ICD-10-CM | POA: Diagnosis not present

## 2018-02-26 DIAGNOSIS — I429 Cardiomyopathy, unspecified: Secondary | ICD-10-CM | POA: Diagnosis not present

## 2018-02-26 DIAGNOSIS — N183 Chronic kidney disease, stage 3 (moderate): Secondary | ICD-10-CM | POA: Diagnosis not present

## 2018-02-26 DIAGNOSIS — E1122 Type 2 diabetes mellitus with diabetic chronic kidney disease: Secondary | ICD-10-CM | POA: Diagnosis not present

## 2018-02-26 DIAGNOSIS — I130 Hypertensive heart and chronic kidney disease with heart failure and stage 1 through stage 4 chronic kidney disease, or unspecified chronic kidney disease: Secondary | ICD-10-CM | POA: Diagnosis not present

## 2018-02-26 LAB — PROTIME-INR: INR: 1.9 — AB (ref 0.9–1.1)

## 2018-02-26 NOTE — Telephone Encounter (Signed)
[name] with [facility] notified to reach out to the cardiology office to receive the needed orders. Patient was unable to be seen today for his coumadin clinic check because of the absence of orders.

## 2018-02-26 NOTE — Telephone Encounter (Signed)
See message and please advise.  

## 2018-02-26 NOTE — Telephone Encounter (Signed)
Attempted to reach [name]. He was not available and did not have a voicemail.

## 2018-02-26 NOTE — Telephone Encounter (Signed)
This is a question for coumadin clinic

## 2018-02-26 NOTE — Telephone Encounter (Signed)
[facility] calling stating that they were suppose to [facility] yesterday but due to staffing they were unable to get patient open yesterday. They need a verbal order so they can do this today  Please advise  [facility] [phone] [facility].

## 2018-02-26 NOTE — Progress Notes (Signed)
Remote pacemaker transmission.   

## 2018-02-26 NOTE — Telephone Encounter (Signed)
OK 

## 2018-02-26 NOTE — Telephone Encounter (Signed)
New Message:    Lorin Picket from [facility] is calling for orders to continue care for Mr. Everheart. This Friday will be week 5 of pt care.

## 2018-02-26 NOTE — Telephone Encounter (Signed)
LM for Lorin Picket RN with [facility] concerning request for orders. Was unable to locate in Epic where Dr. Jens Som has been writing for home health orders

## 2018-02-26 NOTE — Telephone Encounter (Signed)
See message and please advise if ok to provide.

## 2018-02-26 NOTE — Telephone Encounter (Signed)
[facility] is asking for verbal orders  for skilled nurse visit for patient and for medical social worker to go to patients home   Please advise [phone] Lorin Picket.

## 2018-02-27 ENCOUNTER — Ambulatory Visit (INDEPENDENT_AMBULATORY_CARE_PROVIDER_SITE_OTHER): Payer: Medicare HMO | Admitting: Cardiovascular Disease

## 2018-02-27 ENCOUNTER — Telehealth: Payer: Self-pay | Admitting: Internal Medicine

## 2018-02-27 DIAGNOSIS — Z5181 Encounter for therapeutic drug level monitoring: Secondary | ICD-10-CM

## 2018-02-27 DIAGNOSIS — I482 Chronic atrial fibrillation: Secondary | ICD-10-CM | POA: Diagnosis not present

## 2018-02-27 DIAGNOSIS — I4821 Permanent atrial fibrillation: Secondary | ICD-10-CM

## 2018-02-27 NOTE — Telephone Encounter (Signed)
Follow Up: ° ° ° ° °Returning call from yesterday. °

## 2018-02-27 NOTE — Telephone Encounter (Signed)
Spoke with [place] with [facility].  Pt PCP signing home health orders.  No further action needed.

## 2018-02-27 NOTE — Telephone Encounter (Signed)
New Message   Lorin Picket from [facility] is calling for orders to continue care for [name].

## 2018-02-27 NOTE — Telephone Encounter (Signed)
Left message to call back  

## 2018-02-27 NOTE — Telephone Encounter (Signed)
LVM for Scott to call back so I could give him needed verbal orders.

## 2018-02-28 DIAGNOSIS — I130 Hypertensive heart and chronic kidney disease with heart failure and stage 1 through stage 4 chronic kidney disease, or unspecified chronic kidney disease: Secondary | ICD-10-CM | POA: Diagnosis not present

## 2018-02-28 DIAGNOSIS — I429 Cardiomyopathy, unspecified: Secondary | ICD-10-CM | POA: Diagnosis not present

## 2018-02-28 DIAGNOSIS — N183 Chronic kidney disease, stage 3 (moderate): Secondary | ICD-10-CM | POA: Diagnosis not present

## 2018-02-28 DIAGNOSIS — I5043 Acute on chronic combined systolic (congestive) and diastolic (congestive) heart failure: Secondary | ICD-10-CM | POA: Diagnosis not present

## 2018-02-28 DIAGNOSIS — E1122 Type 2 diabetes mellitus with diabetic chronic kidney disease: Secondary | ICD-10-CM | POA: Diagnosis not present

## 2018-02-28 DIAGNOSIS — I482 Chronic atrial fibrillation: Secondary | ICD-10-CM | POA: Diagnosis not present

## 2018-03-04 ENCOUNTER — Ambulatory Visit (INDEPENDENT_AMBULATORY_CARE_PROVIDER_SITE_OTHER): Payer: Medicare HMO | Admitting: Interventional Cardiology

## 2018-03-04 DIAGNOSIS — Z5181 Encounter for therapeutic drug level monitoring: Secondary | ICD-10-CM | POA: Diagnosis not present

## 2018-03-04 DIAGNOSIS — I4821 Permanent atrial fibrillation: Secondary | ICD-10-CM

## 2018-03-04 DIAGNOSIS — E1122 Type 2 diabetes mellitus with diabetic chronic kidney disease: Secondary | ICD-10-CM | POA: Diagnosis not present

## 2018-03-04 DIAGNOSIS — I482 Chronic atrial fibrillation: Secondary | ICD-10-CM | POA: Diagnosis not present

## 2018-03-04 DIAGNOSIS — I5043 Acute on chronic combined systolic (congestive) and diastolic (congestive) heart failure: Secondary | ICD-10-CM | POA: Diagnosis not present

## 2018-03-04 DIAGNOSIS — I429 Cardiomyopathy, unspecified: Secondary | ICD-10-CM | POA: Diagnosis not present

## 2018-03-04 DIAGNOSIS — N183 Chronic kidney disease, stage 3 (moderate): Secondary | ICD-10-CM | POA: Diagnosis not present

## 2018-03-04 DIAGNOSIS — I130 Hypertensive heart and chronic kidney disease with heart failure and stage 1 through stage 4 chronic kidney disease, or unspecified chronic kidney disease: Secondary | ICD-10-CM | POA: Diagnosis not present

## 2018-03-04 LAB — POCT INR: INR: 2.7

## 2018-03-04 NOTE — Patient Instructions (Signed)
Description   Spoke with Lorin Picket RN with [facility] & instructed pt to continue taking 1/2 tablet daily except 1 tablet on Tuesdays, Thursdays and Saturdays. Repeat in 1 week. Orders given to [place] with  [facility]. Call us with any concerns or new medications # [phone].

## 2018-03-07 ENCOUNTER — Encounter: Payer: Self-pay | Admitting: Physician Assistant

## 2018-03-07 ENCOUNTER — Telehealth: Payer: Self-pay

## 2018-03-07 ENCOUNTER — Ambulatory Visit: Payer: Medicare HMO | Admitting: Physician Assistant

## 2018-03-07 VITALS — BP 128/62 | HR 60 | Ht 68.0 in | Wt 240.8 lb

## 2018-03-07 DIAGNOSIS — I5042 Chronic combined systolic (congestive) and diastolic (congestive) heart failure: Secondary | ICD-10-CM | POA: Diagnosis not present

## 2018-03-07 DIAGNOSIS — I10 Essential (primary) hypertension: Secondary | ICD-10-CM | POA: Diagnosis not present

## 2018-03-07 DIAGNOSIS — I5043 Acute on chronic combined systolic (congestive) and diastolic (congestive) heart failure: Secondary | ICD-10-CM | POA: Diagnosis not present

## 2018-03-07 DIAGNOSIS — I428 Other cardiomyopathies: Secondary | ICD-10-CM | POA: Diagnosis not present

## 2018-03-07 DIAGNOSIS — I429 Cardiomyopathy, unspecified: Secondary | ICD-10-CM | POA: Diagnosis not present

## 2018-03-07 DIAGNOSIS — Z95810 Presence of automatic (implantable) cardiac defibrillator: Secondary | ICD-10-CM | POA: Diagnosis not present

## 2018-03-07 DIAGNOSIS — E119 Type 2 diabetes mellitus without complications: Secondary | ICD-10-CM

## 2018-03-07 DIAGNOSIS — I482 Chronic atrial fibrillation: Secondary | ICD-10-CM

## 2018-03-07 DIAGNOSIS — E785 Hyperlipidemia, unspecified: Secondary | ICD-10-CM

## 2018-03-07 DIAGNOSIS — I4821 Permanent atrial fibrillation: Secondary | ICD-10-CM

## 2018-03-07 DIAGNOSIS — I5022 Chronic systolic (congestive) heart failure: Secondary | ICD-10-CM | POA: Diagnosis not present

## 2018-03-07 DIAGNOSIS — E1122 Type 2 diabetes mellitus with diabetic chronic kidney disease: Secondary | ICD-10-CM | POA: Diagnosis not present

## 2018-03-07 DIAGNOSIS — I493 Ventricular premature depolarization: Secondary | ICD-10-CM

## 2018-03-07 DIAGNOSIS — I130 Hypertensive heart and chronic kidney disease with heart failure and stage 1 through stage 4 chronic kidney disease, or unspecified chronic kidney disease: Secondary | ICD-10-CM | POA: Diagnosis not present

## 2018-03-07 DIAGNOSIS — N183 Chronic kidney disease, stage 3 (moderate): Secondary | ICD-10-CM | POA: Diagnosis not present

## 2018-03-07 NOTE — Telephone Encounter (Signed)
Patient referred to [facility] clinic by Gypsy Balsam, NP.  Attempted ICM intro call to daughter, Bradley Ferris [facility]) and left message for return call with ICM direct number.

## 2018-03-07 NOTE — Patient Instructions (Signed)
Medication Instructions:  Continue to decrease Amiodarone as preciously intructed  Labwork: Your physician recommends that you return for lab work in: TODAY-BMET   Testing/Procedures: None  Follow-Up: Your physician recommends that you schedule a follow-up appointment in: 2-4 Months with Dr Ladona Ridgel Your physician recommends that you schedule a follow-up appointment in: 3-4 Months with [place]  Any Other Special Instructions Will Be Listed Below (If Applicable).  If you need a refill on your cardiac medications before your next appointment, please call your pharmacy.

## 2018-03-07 NOTE — Telephone Encounter (Signed)
Daughter left message and attempted return call but no answer.  Left message will attempt call back on Monday, 03/10/2018.

## 2018-03-07 NOTE — Progress Notes (Signed)
Cardiology Office Note    Date:  03/08/2018   ID:  [name], DOB Nov 15, 1929, MRN [id number]  PCP:  Romero Belling, MD  Cardiologist:  Dr. Jens Som  Primary electrophysiologist: Dr. Ladona Ridgel  Chief Complaint  Patient presents with  . Hospitalization Follow-up    seen for Dr. Jens Som    History of Present Illness:  [name] is a 84 y.o. male with PMH of NICM s/p BiV ICD, chronic combined systolic and diastolic HF, permanent atrial fibrillation, bradycardia, HTN, HLD and DM II. He had a cardiac cath in 12/2004 that showed no CAD, EF 40%. Myoview on 05/04/2008 showed EF 31%, prior infarct with mild peri-infarct ischemia. This was reviewed by Dr. Jens Som and felt to be low risk. Holter in 07/2010 showed mildly reduced HR, coreg reduced. He had BiV ICD implanted in 10/2012, this was later downgraded to BiV PPM 07/2016. Echo in 06/2016 showed EF 35-40%, mild LAE.  I first saw the patient on 01/13/2018, he had a significant lower extremity edema at the time after his metolazone and spironolactone were discontinued in February.  Prior to that, he was on 20 mg daily of Lasix.  I did instruct him to increase his Lasix to 40 mg twice daily for 2 days before going back to 40 mg daily thereafter.  However on physical exam on follow-up 02/18/2018, her lower extremity edema actually worsened despite the increase of the diuretic.  I discussed the case with Dr. Duke Salvia and we decided to admit the patient to the hospital for IV diuresis.  He underwent aggressive diuresis.  Dr. Graciela Husbands of electrophysiology service was also consulted during this admission due to frequent PVCs and a ventricular bigeminy on telemetry.  He was placed on amiodarone for PVC suppression.  During this admission he eventually lost roughly 20 pounds, his discharge weight was 238 pounds.  He was discharged to 80 mg twice daily of Lasix.  Our case manager has also checked his benefit, 30-day supply of Eliquis is $45 while at  90-day supply cost about $125.  I discussed this with the patient, however he wished to stay on Coumadin for now.  We have also set the patient up with home health to help checking his Coumadin level.  Patient presents today for cardiology office visit.  He appears to be euvolemic.  INR is stable and within therapeutic range based on recent lab work.  I have discussed with the patient and his daughter again regarding NOAC, he is willing to try Eliquis.  Given the recent diuresis and the medication adjustment, I will obtain a basic metabolic panel to assess his renal function.  I will send a staff message to our Coumadin clinic in [facility] to transition him to Eliquis after the next INR check.  Otherwise he denies any chest pain, orthopnea or PND.   Past Medical History:  Diagnosis Date  . Anemia   . Atrial fibrillation (HCC)   . BPH (benign prostatic hyperplasia)   . Bradycardia   . Cardiomyopathy   . CHF (congestive heart failure) (HCC)   . Diabetes mellitus type II   . GERD (gastroesophageal reflux disease)   . History of colonoscopy   . HTN (hypertension)   . Hyperlipidemia   . ICD (implantable cardiac defibrillator) in place 10/14/2012   biventricular  . OA (osteoarthritis)   . Obesity   . Peptic ulcer     Past Surgical History:  Procedure Laterality Date  . EP IMPLANTABLE DEVICE Left   .  EP IMPLANTABLE DEVICE N/A 07/27/2016   Procedure: BIV Pacemaker downgrade;  Surgeon: Marinus Maw, MD;  Location: [facility] INVASIVE CV LAB;  Service: Cardiovascular;  Laterality: N/A;  . ESOPHAGOGASTRODUODENOSCOPY  06/24/2002  . L-spine  1965  . Lumbar L4-5 & S1  02/2000  . TOTAL KNEE ARTHROPLASTY  1997   right  . TOTAL KNEE ARTHROPLASTY Left 02/15/2015   Procedure: LEFT TOTAL KNEE ARTHROPLASTY;  Surgeon: Durene Romans, MD;  Location: [facility] ORS;  Service: Orthopedics;  Laterality: Left;    Current Medications: Outpatient Medications Prior to Visit  Medication Sig Dispense Refill  .  acetaminophen (TYLENOL) 650 MG CR tablet Take 650-1,300 mg by mouth every 8 (eight) hours as needed for pain.    [name] allopurinol (ZYLOPRIM) 100 MG tablet TAKE 1 TABLET EVERY DAY (Patient taking differently: TAKE 1 TABLET (100mg ) EVERY DAY) 90 tablet 1  . amiodarone (PACERONE) 200 MG tablet Take 2 tablets (400 mg) twice daily for 3 days. Then decrease to 1 tablet (200 mg) twice daily for 30 days. Then decrease to 1 tablet (200 mg) daily. 72 tablet 3  . atorvastatin (LIPITOR) 20 MG tablet TAKE 1 TABLET EVERY DAY (Patient taking differently: TAKE 1 TABLET (20mg ) EVERY DAY) 90 tablet 3  . cholecalciferol (VITAMIN D) 1000 units tablet Take 1,000 Units by mouth daily.    . furosemide (LASIX) 80 MG tablet Take 1 tablet (80 mg total) by mouth 2 (two) times daily. 180 tablet 3  . lisinopril (PRINIVIL,ZESTRIL) 5 MG tablet Take 0.5 tablets (2.5 mg total) by mouth daily. 90 tablet 3  . magnesium oxide (MAG-OX) 400 (241.3 Mg) MG tablet Take 1 tablet (400 mg total) by mouth 2 (two) times daily. 60 tablet 3  . metoprolol tartrate (LOPRESSOR) 25 MG tablet Take 1 tablet (25 mg total) by mouth 2 (two) times daily. 180 tablet 3  . polyethylene glycol (MIRALAX / GLYCOLAX) packet Take 17 g by mouth daily as needed (constipation).    . potassium chloride SA (K-DUR,KLOR-CON) 20 MEQ tablet Take 2 tablets (40 mEq total) by mouth 2 (two) times daily. (Patient taking differently: Take 40 mEq by mouth 2 (two) times daily. Take 1 tablet BID) 35 tablet 2  . tamsulosin (FLOMAX) 0.4 MG CAPS capsule TAKE 1 CAPSULE TWICE DAILY (Patient taking differently: TAKE 1 CAPSULE (0.4mg ) TWICE DAILY) 180 capsule 2  . warfarin (COUMADIN) 5 MG tablet Take 0.5 tablets (2.5 mg total) by mouth one time only at 6 PM. (Patient taking differently: Take 2.5 mg by mouth one time only at 6 PM. Medication is given based off of INR that is done on Tuesdays) 90 tablet 1   No facility-administered medications prior to visit.      Allergies:   Patient has no  known allergies.   Social History   Socioeconomic History  . Marital status: Widowed    Spouse name: Not on file  . Number of children: Not on file  . Years of education: Not on file  . Highest education level: Not on file  Occupational History  . Occupation: Retired  Engineer, production  . Financial resource strain: Not on file  . Food insecurity:    Worry: Not on file    Inability: Not on file  . Transportation needs:    Medical: Not on file    Non-medical: Not on file  Tobacco Use  . Smoking status: Never Smoker  . Smokeless tobacco: Never Used  Substance and Sexual Activity  . Alcohol use: No  .  Drug use: No  . Sexual activity: Not on file  Lifestyle  . Physical activity:    Days per week: Not on file    Minutes per session: Not on file  . Stress: Not on file  Relationships  . Social connections:    Talks on phone: Not on file    Gets together: Not on file    Attends religious service: Not on file    Active member of club or organization: Not on file    Attends meetings of clubs or organizations: Not on file    Relationship status: Not on file  Other Topics Concern  . Not on file  Social History Narrative  . Not on file     Family History:  The patient's family history includes Diabetes in his mother; Heart disease in his father and mother; Hypertension in his brother and brother.   ROS:   Please see the history of present illness.    ROS All other systems reviewed and are negative.   PHYSICAL EXAM:   VS:  BP 128/62   Pulse 60   Ht 5\' 8"  (1.727 m)   Wt 240 lb 12.8 oz (109.2 kg)   SpO2 97%   BMI 36.61 kg/m    GEN: Well nourished, well developed, in no acute distress  HEENT: normal  Neck: no JVD, carotid bruits, or masses Cardiac: RRR; no murmurs, rubs, or gallops. Trace ankle edema  Respiratory:  clear to auscultation bilaterally, normal work of breathing GI: soft, nontender, nondistended, + BS MS: no deformity or atrophy  Skin: warm and dry, no  rash Neuro:  Alert and Oriented x 3, Strength and sensation are intact Psych: euthymic mood, full affect  Wt Readings from Last 3 Encounters:  03/07/18 240 lb 12.8 oz (109.2 kg)  02/23/18 243 lb 6.4 oz (110.4 kg)  02/18/18 260 lb 12.8 oz (118.3 kg)      Studies/Labs Reviewed:   EKG:  EKG is not ordered today.   Recent Labs: 08/21/2017: Pro B Natriuretic peptide (BNP) 163.0 02/18/2018: ALT 19; B Natriuretic Peptide 325.8 02/20/2018: TSH 1.238 02/22/2018: Magnesium 1.5 02/23/2018: Hemoglobin 11.0; Platelets 138 03/07/2018: BUN 63; Creatinine, Ser 1.69; Potassium 5.3; Sodium 133   Lipid Panel    Component Value Date/Time   CHOL 138 01/16/2017 1435   TRIG 64 01/16/2017 1435   HDL 58 01/16/2017 1435   CHOLHDL 2.4 01/16/2017 1435   VLDL 13 01/16/2017 1435   LDLCALC 67 01/16/2017 1435    Additional studies/ records that were reviewed today include:   Echo 02/19/2018 LV EF: 40% -   45%  Study Conclusions  - Left ventricle: The cavity size was moderately dilated. Wall   thickness was increased in a pattern of moderate LVH.   Incoordinate septal motion. Systolic function was mildly to   moderately reduced. The estimated ejection fraction was in the   range of 40% to 45%. Diffuse hypokinesis. The study is not   technically sufficient to allow evaluation of LV diastolic   function. - Aortic valve: Mildly calcified leaflets. There was no stenosis.   There was no regurgitation. - Aorta: Aortic root dimension: 38 mm (ED). - Aortic root: The aortic root was top normal in size. - Mitral valve: Mildly thickened leaflets . There was mild to   moderate regurgitation. - Left atrium: Severely dilated. The atrium was normal in size. - Right ventricle: The cavity size was normal. Wall thickness was   normal. AICD wire noted in right ventricle.  Systolic function was   low normal. - Right atrium: Severely dilated. AICD wire noted in right atrium. - Tricuspid valve: There was trivial  regurgitation. - Pulmonary arteries: PA peak pressure: 21 mm Hg (S) + RAP. - Systemic veins: Not visualized.  Impressions:  - Compared to a prior study in 2017, the LVEF is higher at 40-45%   with global hypokinesis and severe biatrial enlargement.     ASSESSMENT:    1. Chronic combined systolic and diastolic CHF (congestive heart failure) (HCC)   2. NICM (nonischemic cardiomyopathy) (HCC)   3. Biventricular ICD (implantable cardioverter-defibrillator) in place   4. Permanent atrial fibrillation (HCC)   5. Essential hypertension   6. Hyperlipidemia, unspecified hyperlipidemia type   7. Controlled type 2 diabetes mellitus without complication, without long-term current use of insulin (HCC)   8. Frequent PVCs      PLAN:  In order of problems listed above:  1. Chronic combined systolic and diastolic heart failure: He appears to be euvolemic on today's physical exam.  Continue Lasix 80 mg twice daily.  Instead of 40 mEq twice daily of potassium supplement, her daughter actually had been giving him 20 mEq twice daily dosing as she misunderstood the instruction.  I will obtain a basic metabolic panel today to assess potassium level.  -If renal function worsens, I plan to decrease Lasix to 80 mg a.m. and 40 mg p.m.  2. NICM: Previous cardiac catheterization in 2006 showed no evidence of coronary artery disease  3. PVCs: She was found to have frequent PVCs during the recent hospitalization, he was placed on timed titrating dose of amiodarone.  He is currently on 200 mg twice daily dosing, after 1 month, he will transition to 200 mg daily   4. Permanent atrial fibrillation: On Coumadin at this point, INR stable on recent lab work.  Patient and his daughter has agreed to transition to Eliquis 5 mg twice daily, I will send a message to our Coumadin clinic in [facility] to make the necessary adjustment after his next INR check next Tuesday.  5. Hypertension: Pressure well  controlled  6. Hyperlipidemia: On Lipitor 20 mg daily.  Due for repeat lab work, will arrange for fasting lipid panel prior to his next visit.  7. DM 2: Managed by primary care provider    Medication Adjustments/Labs and Tests Ordered: Current medicines are reviewed at length with the patient today.  Concerns regarding medicines are outlined above.  Medication changes, Labs and Tests ordered today are listed in the Patient Instructions below. Patient Instructions  Medication Instructions:  Continue to decrease Amiodarone as preciously intructed  Labwork: Your physician recommends that you return for lab work in: TODAY-BMET   Testing/Procedures: None  Follow-Up: Your physician recommends that you schedule a follow-up appointment in: 2-4 Months with Dr Ladona Ridgel Your physician recommends that you schedule a follow-up appointment in: 3-4 Months with [facility]  Any Other Special Instructions Will Be Listed Below (If Applicable).  If you need a refill on your cardiac medications before your next appointment, please call your pharmacy.     Ramond Dial, [place]  03/08/2018 12:16 PM    [facility] [address] [place], [place], [place]  [address] Phone: [phone]; Fax: [phone]

## 2018-03-08 ENCOUNTER — Encounter: Payer: Self-pay | Admitting: Physician Assistant

## 2018-03-08 DIAGNOSIS — I493 Ventricular premature depolarization: Secondary | ICD-10-CM | POA: Insufficient documentation

## 2018-03-08 LAB — BASIC METABOLIC PANEL
BUN/Creatinine Ratio: 37 — ABNORMAL HIGH (ref 10–24)
BUN: 63 mg/dL — ABNORMAL HIGH (ref 8–27)
CO2: 22 mmol/L (ref 20–29)
CREATININE: 1.69 mg/dL — AB (ref 0.76–1.27)
Calcium: 9.3 mg/dL (ref 8.6–10.2)
Chloride: 96 mmol/L (ref 96–106)
GFR, EST AFRICAN AMERICAN: 41 mL/min/{1.73_m2} — AB (ref >59)
GFR, EST NON AFRICAN AMERICAN: 36 mL/min/{1.73_m2} — AB (ref >59)
Glucose: 135 mg/dL — ABNORMAL HIGH (ref 65–99)
Potassium: 5.3 mmol/L — ABNORMAL HIGH (ref 3.5–5.2)
SODIUM: 133 mmol/L — AB (ref 134–144)

## 2018-03-10 ENCOUNTER — Other Ambulatory Visit: Payer: Self-pay

## 2018-03-10 DIAGNOSIS — I5022 Chronic systolic (congestive) heart failure: Secondary | ICD-10-CM

## 2018-03-10 NOTE — Telephone Encounter (Signed)
Received call back from daughter, Bradley Ferris [facility]).  ICM intro given and she agreed to monthly ICM follow up.  Patient was hospitalized in March and had 25 lbs of fluid removed.  She stated there was fluid weeping from both legs but all of that has resolved now. Legs are normal size and weight is stable at 236.6 lbs. She has thrown all the salty foods away from his home and started over with the new diet.  She removed salt shakers from home. He is now using Mrs Sharilyn Sites and eating low salt foods. He weighs daily.  He is taking Furosemide 80 mg twice a day.  1st ICM remote transmission is 03/20/2018. Advised to call if patient has any fluid symptoms between scheduled remote transmissions.

## 2018-03-11 ENCOUNTER — Ambulatory Visit (INDEPENDENT_AMBULATORY_CARE_PROVIDER_SITE_OTHER): Payer: Medicare HMO | Admitting: Cardiology

## 2018-03-11 ENCOUNTER — Other Ambulatory Visit: Payer: Self-pay | Admitting: *Deleted

## 2018-03-11 DIAGNOSIS — E1122 Type 2 diabetes mellitus with diabetic chronic kidney disease: Secondary | ICD-10-CM | POA: Diagnosis not present

## 2018-03-11 DIAGNOSIS — I5043 Acute on chronic combined systolic (congestive) and diastolic (congestive) heart failure: Secondary | ICD-10-CM | POA: Diagnosis not present

## 2018-03-11 DIAGNOSIS — I4821 Permanent atrial fibrillation: Secondary | ICD-10-CM

## 2018-03-11 DIAGNOSIS — I482 Chronic atrial fibrillation: Secondary | ICD-10-CM

## 2018-03-11 DIAGNOSIS — I429 Cardiomyopathy, unspecified: Secondary | ICD-10-CM | POA: Diagnosis not present

## 2018-03-11 DIAGNOSIS — Z5181 Encounter for therapeutic drug level monitoring: Secondary | ICD-10-CM

## 2018-03-11 DIAGNOSIS — N183 Chronic kidney disease, stage 3 (moderate): Secondary | ICD-10-CM | POA: Diagnosis not present

## 2018-03-11 DIAGNOSIS — I130 Hypertensive heart and chronic kidney disease with heart failure and stage 1 through stage 4 chronic kidney disease, or unspecified chronic kidney disease: Secondary | ICD-10-CM | POA: Diagnosis not present

## 2018-03-11 LAB — POCT INR: INR: 4.6

## 2018-03-13 ENCOUNTER — Telehealth: Payer: Self-pay | Admitting: Cardiology

## 2018-03-13 DIAGNOSIS — I5043 Acute on chronic combined systolic (congestive) and diastolic (congestive) heart failure: Secondary | ICD-10-CM | POA: Diagnosis not present

## 2018-03-13 DIAGNOSIS — I130 Hypertensive heart and chronic kidney disease with heart failure and stage 1 through stage 4 chronic kidney disease, or unspecified chronic kidney disease: Secondary | ICD-10-CM | POA: Diagnosis not present

## 2018-03-13 DIAGNOSIS — E1122 Type 2 diabetes mellitus with diabetic chronic kidney disease: Secondary | ICD-10-CM | POA: Diagnosis not present

## 2018-03-13 DIAGNOSIS — I429 Cardiomyopathy, unspecified: Secondary | ICD-10-CM | POA: Diagnosis not present

## 2018-03-13 DIAGNOSIS — I482 Chronic atrial fibrillation: Secondary | ICD-10-CM | POA: Diagnosis not present

## 2018-03-13 DIAGNOSIS — N183 Chronic kidney disease, stage 3 (moderate): Secondary | ICD-10-CM | POA: Diagnosis not present

## 2018-03-13 MED ORDER — AMIODARONE HCL 200 MG PO TABS: ORAL_TABLET | ORAL | 3 refills | Status: DC

## 2018-03-13 MED ORDER — LISINOPRIL 5 MG PO TABS: 2.5000 mg | ORAL_TABLET | Freq: Every day | ORAL | 3 refills | Status: DC

## 2018-03-13 MED ORDER — METOPROLOL TARTRATE 25 MG PO TABS: 25.0000 mg | ORAL_TABLET | Freq: Two times a day (BID) | ORAL | 3 refills | Status: DC

## 2018-03-13 MED ORDER — MAGNESIUM OXIDE 400 (241.3 MG) MG PO TABS: 400.0000 mg | ORAL_TABLET | Freq: Two times a day (BID) | ORAL | 3 refills | Status: DC

## 2018-03-13 MED ORDER — POTASSIUM CHLORIDE CRYS ER 20 MEQ PO TBCR: 40.0000 meq | EXTENDED_RELEASE_TABLET | Freq: Two times a day (BID) | ORAL | 3 refills | Status: DC

## 2018-03-13 NOTE — Telephone Encounter (Signed)
New Message:     *STAT* If patient is at the pharmacy, call can be transferred to refill team.   1. Which medications need to be refilled? (please list name of each medication and dose if known)amiodarone (PACERONE) 200 MG tablet    lisinopril (PRINIVIL,ZESTRIL) 5 MG tablet   potassium chloride SA (K-DUR,KLOR-CON) 20 MEQ tablet   magnesium oxide (MAG-OX) 400 (241.3 Mg) MG tablet   metoprolol tartrate (LOPRESSOR) 25 MG tablet   2. Which pharmacy/location (including street and city if local pharmacy) is medication to be sent to? [facility] - [place], [place] - [address]  3. Do they need a 30 day or 90 day supply? 30

## 2018-03-14 LAB — CUP PACEART REMOTE DEVICE CHECK
Battery Remaining Longevity: 49 mo
Battery Voltage: 3 V
Brady Statistic AP VS Percent: 0 %
Brady Statistic AS VS Percent: 0 %
Brady Statistic RV Percent Paced: 64.22 %
Date Time Interrogation Session: 20190320023211
Implantable Lead Implant Date: 20131105
Implantable Lead Location: 753859
Implantable Lead Model: 6935
Implantable Pulse Generator Implant Date: 20170818
Lead Channel Impedance Value: 342 Ohm
Lead Channel Impedance Value: 437 Ohm
Lead Channel Impedance Value: 627 Ohm
Lead Channel Pacing Threshold Amplitude: 0.625 V
Lead Channel Pacing Threshold Amplitude: 1.25 V
Lead Channel Pacing Threshold Pulse Width: 0.4 ms
Lead Channel Sensing Intrinsic Amplitude: 8.875 mV
Lead Channel Setting Pacing Amplitude: 2 V
Lead Channel Setting Pacing Amplitude: 2.25 V
Lead Channel Setting Pacing Pulse Width: 0.4 ms
Lead Channel Setting Pacing Pulse Width: 1.5 ms
MDC IDC LEAD IMPLANT DT: 20131105
MDC IDC LEAD IMPLANT DT: 20131105
MDC IDC LEAD LOCATION: 753858
MDC IDC LEAD LOCATION: 753860
MDC IDC MSMT LEADCHNL LV IMPEDANCE VALUE: 361 Ohm
MDC IDC MSMT LEADCHNL LV IMPEDANCE VALUE: 418 Ohm
MDC IDC MSMT LEADCHNL LV IMPEDANCE VALUE: 494 Ohm
MDC IDC MSMT LEADCHNL LV PACING THRESHOLD PULSEWIDTH: 1.5 ms
MDC IDC MSMT LEADCHNL RA IMPEDANCE VALUE: 323 Ohm
MDC IDC MSMT LEADCHNL RA IMPEDANCE VALUE: 437 Ohm
MDC IDC MSMT LEADCHNL RV IMPEDANCE VALUE: 361 Ohm
MDC IDC MSMT LEADCHNL RV SENSING INTR AMPL: 8.875 mV
MDC IDC SET LEADCHNL RV SENSING SENSITIVITY: 0.9 mV
MDC IDC STAT BRADY AP VP PERCENT: 0 %
MDC IDC STAT BRADY AS VP PERCENT: 0 %
MDC IDC STAT BRADY RA PERCENT PACED: 0 %

## 2018-03-18 ENCOUNTER — Other Ambulatory Visit: Payer: Self-pay | Admitting: *Deleted

## 2018-03-18 ENCOUNTER — Other Ambulatory Visit: Payer: Self-pay | Admitting: Cardiology

## 2018-03-18 DIAGNOSIS — E1122 Type 2 diabetes mellitus with diabetic chronic kidney disease: Secondary | ICD-10-CM | POA: Diagnosis not present

## 2018-03-18 DIAGNOSIS — N183 Chronic kidney disease, stage 3 (moderate): Secondary | ICD-10-CM | POA: Diagnosis not present

## 2018-03-18 DIAGNOSIS — I482 Chronic atrial fibrillation: Secondary | ICD-10-CM | POA: Diagnosis not present

## 2018-03-18 DIAGNOSIS — I130 Hypertensive heart and chronic kidney disease with heart failure and stage 1 through stage 4 chronic kidney disease, or unspecified chronic kidney disease: Secondary | ICD-10-CM | POA: Diagnosis not present

## 2018-03-18 DIAGNOSIS — I429 Cardiomyopathy, unspecified: Secondary | ICD-10-CM | POA: Diagnosis not present

## 2018-03-18 DIAGNOSIS — I5043 Acute on chronic combined systolic (congestive) and diastolic (congestive) heart failure: Secondary | ICD-10-CM | POA: Diagnosis not present

## 2018-03-18 MED ORDER — FUROSEMIDE 80 MG PO TABS: 80.0000 mg | ORAL_TABLET | Freq: Two times a day (BID) | ORAL | 3 refills | Status: DC

## 2018-03-18 MED ORDER — AMIODARONE HCL 200 MG PO TABS: ORAL_TABLET | ORAL | 1 refills | Status: DC

## 2018-03-18 NOTE — Telephone Encounter (Signed)
°*  STAT* If patient is at the pharmacy, call can be transferred to refill team.   1. Which medications need to be refilled? (please list name of each medication and dose if known) Furosemide-need a prescription called until his mail order comes in  2. Which pharmacy/location (including street and city if local pharmacy) is medication to be sent to?[facility] and [facility]  3. Do they need a 30 day or 90 day supply? 30

## 2018-03-19 DIAGNOSIS — E1122 Type 2 diabetes mellitus with diabetic chronic kidney disease: Secondary | ICD-10-CM | POA: Diagnosis not present

## 2018-03-19 DIAGNOSIS — I429 Cardiomyopathy, unspecified: Secondary | ICD-10-CM | POA: Diagnosis not present

## 2018-03-19 DIAGNOSIS — I5043 Acute on chronic combined systolic (congestive) and diastolic (congestive) heart failure: Secondary | ICD-10-CM | POA: Diagnosis not present

## 2018-03-19 DIAGNOSIS — I130 Hypertensive heart and chronic kidney disease with heart failure and stage 1 through stage 4 chronic kidney disease, or unspecified chronic kidney disease: Secondary | ICD-10-CM | POA: Diagnosis not present

## 2018-03-19 DIAGNOSIS — I482 Chronic atrial fibrillation: Secondary | ICD-10-CM | POA: Diagnosis not present

## 2018-03-19 DIAGNOSIS — N183 Chronic kidney disease, stage 3 (moderate): Secondary | ICD-10-CM | POA: Diagnosis not present

## 2018-03-20 ENCOUNTER — Ambulatory Visit (INDEPENDENT_AMBULATORY_CARE_PROVIDER_SITE_OTHER): Payer: Medicare HMO

## 2018-03-20 ENCOUNTER — Encounter: Payer: Self-pay | Admitting: Endocrinology

## 2018-03-20 ENCOUNTER — Ambulatory Visit (INDEPENDENT_AMBULATORY_CARE_PROVIDER_SITE_OTHER): Payer: Medicare HMO | Admitting: Endocrinology

## 2018-03-20 VITALS — BP 88/58 | HR 61 | Wt 237.0 lb

## 2018-03-20 DIAGNOSIS — E559 Vitamin D deficiency, unspecified: Secondary | ICD-10-CM | POA: Diagnosis not present

## 2018-03-20 DIAGNOSIS — D509 Iron deficiency anemia, unspecified: Secondary | ICD-10-CM | POA: Diagnosis not present

## 2018-03-20 DIAGNOSIS — Z95810 Presence of automatic (implantable) cardiac defibrillator: Secondary | ICD-10-CM

## 2018-03-20 DIAGNOSIS — I5042 Chronic combined systolic (congestive) and diastolic (congestive) heart failure: Secondary | ICD-10-CM

## 2018-03-20 DIAGNOSIS — E1151 Type 2 diabetes mellitus with diabetic peripheral angiopathy without gangrene: Secondary | ICD-10-CM

## 2018-03-20 DIAGNOSIS — I5022 Chronic systolic (congestive) heart failure: Secondary | ICD-10-CM | POA: Diagnosis not present

## 2018-03-20 DIAGNOSIS — Z0001 Encounter for general adult medical examination with abnormal findings: Secondary | ICD-10-CM | POA: Diagnosis not present

## 2018-03-20 LAB — CBC WITH DIFFERENTIAL/PLATELET
BASOS ABS: 0 10*3/uL (ref 0.0–0.1)
Basophils Relative: 0.5 % (ref 0.0–3.0)
EOS ABS: 0 10*3/uL (ref 0.0–0.7)
Eosinophils Relative: 0.5 % (ref 0.0–5.0)
HEMATOCRIT: 34 % — AB (ref 39.0–52.0)
HEMOGLOBIN: 11.2 g/dL — AB (ref 13.0–17.0)
LYMPHS PCT: 12.5 % (ref 12.0–46.0)
Lymphs Abs: 0.8 10*3/uL (ref 0.7–4.0)
MCHC: 32.9 g/dL (ref 30.0–36.0)
MCV: 98.8 fl (ref 78.0–100.0)
MONOS PCT: 9.8 % (ref 3.0–12.0)
Monocytes Absolute: 0.7 10*3/uL (ref 0.1–1.0)
NEUTROS ABS: 5.2 10*3/uL (ref 1.4–7.7)
Neutrophils Relative %: 76.7 % (ref 43.0–77.0)
PLATELETS: 110 10*3/uL — AB (ref 150.0–400.0)
RBC: 3.44 Mil/uL — AB (ref 4.22–5.81)
RDW: 15 % (ref 11.5–15.5)
WBC: 6.8 10*3/uL (ref 4.0–10.5)

## 2018-03-20 LAB — IBC PANEL
IRON: 41 ug/dL — AB (ref 42–165)
SATURATION RATIOS: 14.4 % — AB (ref 20.0–50.0)
TRANSFERRIN: 203 mg/dL — AB (ref 212.0–360.0)

## 2018-03-20 LAB — LIPID PANEL
Cholesterol: 131 mg/dL (ref 0–200)
HDL: 57.2 mg/dL (ref >39.00)
LDL CALC: 63 mg/dL (ref 0–99)
NonHDL: 74.17
TRIGLYCERIDES: 55 mg/dL (ref 0.0–149.0)
Total CHOL/HDL Ratio: 2
VLDL: 11 mg/dL (ref 0.0–40.0)

## 2018-03-20 LAB — BASIC METABOLIC PANEL
BUN: 55 mg/dL — AB (ref 6–23)
CO2: 29 meq/L (ref 19–32)
Calcium: 9.3 mg/dL (ref 8.4–10.5)
Chloride: 95 mEq/L — ABNORMAL LOW (ref 96–112)
Creatinine, Ser: 1.84 mg/dL — ABNORMAL HIGH (ref 0.40–1.50)
GFR: 37.16 mL/min — AB (ref >60.00)
GLUCOSE: 119 mg/dL — AB (ref 70–99)
POTASSIUM: 4.5 meq/L (ref 3.5–5.1)
SODIUM: 131 meq/L — AB (ref 135–145)

## 2018-03-20 LAB — BRAIN NATRIURETIC PEPTIDE: PRO B NATRI PEPTIDE: 179 pg/mL — AB (ref 0.0–100.0)

## 2018-03-20 LAB — POCT GLYCOSYLATED HEMOGLOBIN (HGB A1C): Hemoglobin A1C: 6.7

## 2018-03-20 MED ORDER — METOPROLOL TARTRATE 25 MG PO TABS: 12.5000 mg | ORAL_TABLET | Freq: Two times a day (BID) | ORAL | 3 refills | Status: DC

## 2018-03-20 NOTE — Progress Notes (Signed)
EPIC Encounter for ICM Monitoring  Patient Name: [name] is a 84 y.o. male Date: 03/20/2018 Primary Care Physican: Romero Belling, MD Primary Cardiologist: Duke Salvia Electrophysiologist: Ladona Ridgel Dry Weight: 236 lbs  Bi-V Pacing:   88.9%       1st ICM remote transmission.  Spoke with daughter Bradley Ferris, [place].  She stated patient is doing well except for low BP.  Metoprolol was decreased at visit with PCP today due to low BP and dizziness. Patient is losing weight and is concerning to daughter.  Advised if weight loss continues at a rapid rate in the next 2-3 weeks to call PCP.  He does have a decreased appetite.    Thoracic impedance normal since Hospital discharge.  Prescribed dosage:  Furosemide 80 mg take 1 tablet twice a day and patient taking differently = 80 mg daily.  Potassium 20 mEq 2 tablet daily.   Recommendations: No changes.  Patient is following a low salt diet with the help of his daughter.  Encouraged to call for fluid symptoms.  Follow-up plan: ICM clinic phone appointment on 04/21/2018.    Copy of ICM check sent to Dr. Ladona Ridgel.   3 month ICM trend: 03/20/2018    1 Year ICM trend:       Karie Soda, RN 03/20/2018 3:55 PM

## 2018-03-20 NOTE — Progress Notes (Signed)
Subjective:    Patient ID: [name], male    DOB: 05-14-1929, 84 y.o.   MRN: [id number]  HPI Pt is here for regular wellness examination, and is feeling pretty well in general, and says chronic med probs are stable, except as noted below Past Medical History:  Diagnosis Date  . Anemia   . Atrial fibrillation (HCC)   . BPH (benign prostatic hyperplasia)   . Bradycardia   . Cardiomyopathy   . CHF (congestive heart failure) (HCC)   . Diabetes mellitus type II   . GERD (gastroesophageal reflux disease)   . History of colonoscopy   . HTN (hypertension)   . Hyperlipidemia   . ICD (implantable cardiac defibrillator) in place 10/14/2012   biventricular  . OA (osteoarthritis)   . Obesity   . Peptic ulcer     Past Surgical History:  Procedure Laterality Date  . EP IMPLANTABLE DEVICE Left   . EP IMPLANTABLE DEVICE N/A 07/27/2016   Procedure: BIV Pacemaker downgrade;  Surgeon: Marinus Maw, MD;  Location: [facility] INVASIVE CV LAB;  Service: Cardiovascular;  Laterality: N/A;  . ESOPHAGOGASTRODUODENOSCOPY  06/24/2002  . L-spine  1965  . Lumbar L4-5 & S1  02/2000  . TOTAL KNEE ARTHROPLASTY  1997   right  . TOTAL KNEE ARTHROPLASTY Left 02/15/2015   Procedure: LEFT TOTAL KNEE ARTHROPLASTY;  Surgeon: Durene Romans, MD;  Location: [facility];  Service: Orthopedics;  Laterality: Left;    Social History   Socioeconomic History  . Marital status: Widowed    Spouse name: Not on file  . Number of children: Not on file  . Years of education: Not on file  . Highest education level: Not on file  Occupational History  . Occupation: Retired  Engineer, production  . Financial resource strain: Not on file  . Food insecurity:    Worry: Not on file    Inability: Not on file  . Transportation needs:    Medical: Not on file    Non-medical: Not on file  Tobacco Use  . Smoking status: Never Smoker  . Smokeless tobacco: Never Used  Substance and Sexual Activity  . Alcohol use: No  . Drug use: No  .  Sexual activity: Not on file  Lifestyle  . Physical activity:    Days per week: Not on file    Minutes per session: Not on file  . Stress: Not on file  Relationships  . Social connections:    Talks on phone: Not on file    Gets together: Not on file    Attends religious service: Not on file    Active member of club or organization: Not on file    Attends meetings of clubs or organizations: Not on file    Relationship status: Not on file  . Intimate partner violence:    Fear of current or ex partner: Not on file    Emotionally abused: Not on file    Physically abused: Not on file    Forced sexual activity: Not on file  Other Topics Concern  . Not on file  Social History Narrative  . Not on file    Current Outpatient Medications on File Prior to Visit  Medication Sig Dispense Refill  . acetaminophen (TYLENOL) 650 MG CR tablet Take 650-1,300 mg by mouth every 8 (eight) hours as needed for pain.    [name] allopurinol (ZYLOPRIM) 100 MG tablet TAKE 1 TABLET EVERY DAY (Patient taking differently: TAKE 1 TABLET (100mg ) EVERY DAY)  90 tablet 1  . amiodarone (PACERONE) 200 MG tablet Take 2 tablets (400 mg) twice daily for 3 days. Then decrease to 1 tablet (200 mg) twice daily for 30 days. Then decrease to 1 tablet (200 mg) daily. 90 tablet 1  . atorvastatin (LIPITOR) 20 MG tablet TAKE 1 TABLET EVERY DAY (Patient taking differently: TAKE 1 TABLET (20mg ) EVERY DAY) 90 tablet 3  . cholecalciferol (VITAMIN D) 1000 units tablet Take 1,000 Units by mouth daily.    . furosemide (LASIX) 40 MG tablet Take 40 mg by mouth at bedtime.    . furosemide (LASIX) 80 MG tablet Take 1 tablet (80 mg total) by mouth 2 (two) times daily. (Patient taking differently: Take 80 mg by mouth every morning. ) 60 tablet 3  . lisinopril (PRINIVIL,ZESTRIL) 5 MG tablet Take 0.5 tablets (2.5 mg total) by mouth daily. 90 tablet 3  . magnesium oxide (MAG-OX) 400 (241.3 Mg) MG tablet Take 1 tablet (400 mg total) by mouth 2 (two)  times daily. 90 tablet 3  . polyethylene glycol (MIRALAX / GLYCOLAX) packet Take 17 g by mouth daily as needed (constipation).    . potassium chloride SA (K-DUR,KLOR-CON) 20 MEQ tablet Take 2 tablets (40 mEq total) by mouth 2 (two) times daily. (Patient taking differently: Take 40 mEq by mouth once. ) 360 tablet 3  . tamsulosin (FLOMAX) 0.4 MG CAPS capsule TAKE 1 CAPSULE TWICE DAILY (Patient taking differently: TAKE 1 CAPSULE (0.4mg ) TWICE DAILY) 180 capsule 2  . warfarin (COUMADIN) 5 MG tablet Take 0.5 tablets (2.5 mg total) by mouth one time only at 6 PM. (Patient taking differently: Take 2.5 mg by mouth one time only at 6 PM. Medication is given based off of INR that is done on Tuesdays) 90 tablet 1   No current facility-administered medications on file prior to visit.     No Known Allergies  Family History  Problem Relation Age of Onset  . Heart disease Father   . Heart disease Mother   . Diabetes Mother   . Hypertension Brother   . Hypertension Brother   . Cancer Neg Hx     BP (!) 88/58 (BP Location: Left Arm, Patient Position: Sitting, Cuff Size: Normal)   Pulse 61   Wt 237 lb (107.5 kg)   SpO2 94%   BMI 36.04 kg/m    Review of Systems Denies fever, fatigue, visual loss, chest pain, cough, depression, cold intolerance, BRBPR, hematuria, seizure, numbness, allergy sxs, and rash.   No change in chronic hearing loss, easy bruising, or low-back pain.     Objective:   Physical Exam VS: see vs page GEN: no distress HEAD: head: no deformity eyes: no periorbital swelling, no proptosis external nose and ears are normal mouth: no lesion seen NECK: supple, thyroid is not enlarged CHEST WALL: no deformity BREASTS:  bilat pseudogynecomastia CV: reg rate and rhythm, no murmur ABD: abdomen is soft, nontender.  no hepatosplenomegaly.  not distended.  no hernia MUSCULOSKELETAL: muscle bulk and strength are grossly normal.  no obvious joint swelling.  gait is steady, with a  cane EXTEMITIES: no deformity.  no ulcer on the feet.  feet are of normal color and temp.  no edema PULSES: dorsalis pedis intact bilat.  no carotid bruit NEURO:  cn 2-12 grossly intact.   readily moves all 4's.  sensation is intact to touch on the feet SKIN:  Normal texture and temperature.  No rash or suspicious lesion is visible.   NODES:  None  palpable at the neck PSYCH: alert, well-oriented.  Does not appear anxious nor depressed.  Pulses: foot pulses are intact bilaterally.   MSK: no deformity of the feet or ankles.  CV: 2+ bilat edema of the legs, and bilat vv's Skin: severe bilat rust discoloration of the legs. Heavy calluses on the feet. no ulcer on the feet, but there are several abrasions and dry, scaly skin.  feet and legs are of normal temp, but are cyanotic.  Neuro: sensation is intact to touch on the feet and ankles.  Ext: There is bilateral onychomycosis of the toenails     Assessment & Plan:  Wellness visit today, with problems stable, except as noted.    SEPARATE EVALUATION FOLLOWS--EACH PROBLEM HERE IS NEW, NOT RESPONDING TO TREATMENT, OR POSES SIGNIFICANT RISK TO THE PATIENT'S HEALTH: HISTORY OF THE PRESENT ILLNESS:  Pt has slight dizziness sensation in the head, but no assoc LOC.  He was recently in the hospital with CHF.  PAST MEDICAL HISTORY Past Medical History:  Diagnosis Date  . Anemia   . Atrial fibrillation (HCC)   . BPH (benign prostatic hyperplasia)   . Bradycardia   . Cardiomyopathy   . CHF (congestive heart failure) (HCC)   . Diabetes mellitus type II   . GERD (gastroesophageal reflux disease)   . History of colonoscopy   . HTN (hypertension)   . Hyperlipidemia   . ICD (implantable cardiac defibrillator) in place 10/14/2012   biventricular  . OA (osteoarthritis)   . Obesity   . Peptic ulcer     Past Surgical History:  Procedure Laterality Date  . EP IMPLANTABLE DEVICE Left   . EP IMPLANTABLE DEVICE N/A 07/27/2016   Procedure: BIV  Pacemaker downgrade;  Surgeon: Marinus Maw, MD;  Location: [facility] INVASIVE CV LAB;  Service: Cardiovascular;  Laterality: N/A;  . ESOPHAGOGASTRODUODENOSCOPY  06/24/2002  . L-spine  1965  . Lumbar L4-5 & S1  02/2000  . TOTAL KNEE ARTHROPLASTY  1997   right  . TOTAL KNEE ARTHROPLASTY Left 02/15/2015   Procedure: LEFT TOTAL KNEE ARTHROPLASTY;  Surgeon: Durene Romans, MD;  Location: [facility];  Service: Orthopedics;  Laterality: Left;    Social History   Socioeconomic History  . Marital status: Widowed    Spouse name: Not on file  . Number of children: Not on file  . Years of education: Not on file  . Highest education level: Not on file  Occupational History  . Occupation: Retired  Engineer, production  . Financial resource strain: Not on file  . Food insecurity:    Worry: Not on file    Inability: Not on file  . Transportation needs:    Medical: Not on file    Non-medical: Not on file  Tobacco Use  . Smoking status: Never Smoker  . Smokeless tobacco: Never Used  Substance and Sexual Activity  . Alcohol use: No  . Drug use: No  . Sexual activity: Not on file  Lifestyle  . Physical activity:    Days per week: Not on file    Minutes per session: Not on file  . Stress: Not on file  Relationships  . Social connections:    Talks on phone: Not on file    Gets together: Not on file    Attends religious service: Not on file    Active member of club or organization: Not on file    Attends meetings of clubs or organizations: Not on file    Relationship status: Not  on file  . Intimate partner violence:    Fear of current or ex partner: Not on file    Emotionally abused: Not on file    Physically abused: Not on file    Forced sexual activity: Not on file  Other Topics Concern  . Not on file  Social History Narrative  . Not on file    Current Outpatient Medications on File Prior to Visit  Medication Sig Dispense Refill  . acetaminophen (TYLENOL) 650 MG CR tablet Take 650-1,300 mg by mouth  every 8 (eight) hours as needed for pain.    [name] allopurinol (ZYLOPRIM) 100 MG tablet TAKE 1 TABLET EVERY DAY (Patient taking differently: TAKE 1 TABLET (100mg ) EVERY DAY) 90 tablet 1  . amiodarone (PACERONE) 200 MG tablet Take 2 tablets (400 mg) twice daily for 3 days. Then decrease to 1 tablet (200 mg) twice daily for 30 days. Then decrease to 1 tablet (200 mg) daily. 90 tablet 1  . atorvastatin (LIPITOR) 20 MG tablet TAKE 1 TABLET EVERY DAY (Patient taking differently: TAKE 1 TABLET (20mg ) EVERY DAY) 90 tablet 3  . cholecalciferol (VITAMIN D) 1000 units tablet Take 1,000 Units by mouth daily.    . furosemide (LASIX) 40 MG tablet Take 40 mg by mouth at bedtime.    . furosemide (LASIX) 80 MG tablet Take 1 tablet (80 mg total) by mouth 2 (two) times daily. (Patient taking differently: Take 80 mg by mouth every morning. ) 60 tablet 3  . lisinopril (PRINIVIL,ZESTRIL) 5 MG tablet Take 0.5 tablets (2.5 mg total) by mouth daily. 90 tablet 3  . magnesium oxide (MAG-OX) 400 (241.3 Mg) MG tablet Take 1 tablet (400 mg total) by mouth 2 (two) times daily. 90 tablet 3  . polyethylene glycol (MIRALAX / GLYCOLAX) packet Take 17 g by mouth daily as needed (constipation).    . potassium chloride SA (K-DUR,KLOR-CON) 20 MEQ tablet Take 2 tablets (40 mEq total) by mouth 2 (two) times daily. (Patient taking differently: Take 40 mEq by mouth once. ) 360 tablet 3  . tamsulosin (FLOMAX) 0.4 MG CAPS capsule TAKE 1 CAPSULE TWICE DAILY (Patient taking differently: TAKE 1 CAPSULE (0.4mg ) TWICE DAILY) 180 capsule 2  . warfarin (COUMADIN) 5 MG tablet Take 0.5 tablets (2.5 mg total) by mouth one time only at 6 PM. (Patient taking differently: Take 2.5 mg by mouth one time only at 6 PM. Medication is given based off of INR that is done on Tuesdays) 90 tablet 1   No current facility-administered medications on file prior to visit.     No Known Allergies  Family History  Problem Relation Age of Onset  . Heart disease Father   .  Heart disease Mother   . Diabetes Mother   . Hypertension Brother   . Hypertension Brother   . Cancer Neg Hx     BP (!) 88/58 (BP Location: Left Arm, Patient Position: Sitting, Cuff Size: Normal)   Pulse 61   Wt 237 lb (107.5 kg)   SpO2 94%   BMI 36.04 kg/m   REVIEW OF SYSTEMS: Denies cramps and sob.  PHYSICAL EXAMINATION: VITAL SIGNS:  See vs page GENERAL: no distress LUNGS:  Clear to auscultation IMPRESSION: Dizziness, prob due to hypotension Renal insuff: recheck today Anemia: recheck today Hypokalemia: recheck today PLAN:  I have sent a prescription to your pharmacy, to reduce metoprolol

## 2018-03-20 NOTE — Patient Instructions (Addendum)
Please reduce the metoprolol to 1/2 pill, twice a day.  blood tests are requested for you today.  We'll let you know about the results.   Please consider these measures for your health:  minimize alcohol.  Do not use tobacco products.  Keep firearms safely stored.  Always use seat belts.  have working smoke alarms in your home.  See an eye doctor and dentist regularly.  Never drive under the influence of alcohol or drugs (including prescription drugs).  Those with fair skin should take precautions against the sun, and should carefully examine their skin once per month, for any new or changed moles. It is critically important to prevent falling down (keep floor areas well-lit, dry, and free of loose objects.  If you have a cane, walker, or wheelchair, you should use it, even for short trips around the house.  Wear flat-soled shoes.  Also, try not to rush).    Please come back for a follow-up appointment in 1 month.

## 2018-03-21 ENCOUNTER — Ambulatory Visit (INDEPENDENT_AMBULATORY_CARE_PROVIDER_SITE_OTHER): Payer: Medicare HMO | Admitting: Cardiology

## 2018-03-21 DIAGNOSIS — N183 Chronic kidney disease, stage 3 (moderate): Secondary | ICD-10-CM | POA: Diagnosis not present

## 2018-03-21 DIAGNOSIS — I429 Cardiomyopathy, unspecified: Secondary | ICD-10-CM | POA: Diagnosis not present

## 2018-03-21 DIAGNOSIS — I5043 Acute on chronic combined systolic (congestive) and diastolic (congestive) heart failure: Secondary | ICD-10-CM | POA: Diagnosis not present

## 2018-03-21 DIAGNOSIS — E1122 Type 2 diabetes mellitus with diabetic chronic kidney disease: Secondary | ICD-10-CM | POA: Diagnosis not present

## 2018-03-21 DIAGNOSIS — Z5181 Encounter for therapeutic drug level monitoring: Secondary | ICD-10-CM

## 2018-03-21 DIAGNOSIS — I482 Chronic atrial fibrillation: Secondary | ICD-10-CM | POA: Diagnosis not present

## 2018-03-21 DIAGNOSIS — I4821 Permanent atrial fibrillation: Secondary | ICD-10-CM

## 2018-03-21 DIAGNOSIS — I130 Hypertensive heart and chronic kidney disease with heart failure and stage 1 through stage 4 chronic kidney disease, or unspecified chronic kidney disease: Secondary | ICD-10-CM | POA: Diagnosis not present

## 2018-03-21 LAB — POCT INR: INR: 2.9

## 2018-03-21 LAB — VITAMIN D 25 HYDROXY (VIT D DEFICIENCY, FRACTURES): VITD: 28.72 ng/mL — ABNORMAL LOW (ref 30.00–100.00)

## 2018-03-21 NOTE — Patient Instructions (Addendum)
Description   Spoke with Vonna Kotyk RN with [facility] & instructed pt to continue taking 1/2 tablet daily except 1 tablet on Tuesdays. Repeat in 1 week. Ask pt if he is wanting to switch to NOAC-note in the black book on last visit. Orders given to Vonna Kotyk RN with [facility] [phone]. Call us with any concerns or new medications # [phone].

## 2018-03-24 ENCOUNTER — Other Ambulatory Visit: Payer: Self-pay | Admitting: *Deleted

## 2018-03-24 ENCOUNTER — Telehealth: Payer: Self-pay | Admitting: Internal Medicine

## 2018-03-24 DIAGNOSIS — I5042 Chronic combined systolic (congestive) and diastolic (congestive) heart failure: Secondary | ICD-10-CM

## 2018-03-24 NOTE — Telephone Encounter (Signed)
New message  Pt daughter verbalzied that she is calling for RN  Pt c/o Syncope: STAT if syncope occurred within 30 minutes and pt complains of lightheadedness High Priority if episode of passing out, completely, today or in last 24 hours   Did you pass out today? no 1. When is the last time you passed out? no  2. Has this occurred multiple times? Yes, dizzy every day   3. Did you have any symptoms prior to passing out? Constantly dizzy

## 2018-03-24 NOTE — Telephone Encounter (Signed)
Spoke with patients daughter, ok per DPR patient has been having dizziness since being discharged from the hospital 02/23/18. Last week he saw his PCP and Metoprolol was reduced to 25 mg 1/2 tablet twice a day secondary to hypotension. His dizziness seems to be worse when going from sitting to standing position and has gotten worse since decreasing Metoprolol. His SBP has not been below 100 since decreasing Metoprolol. Per daughter patient had an issue with this before when he was getting too much Lasix. His Lasix was reduced to 80 mg in the am and 40 mg in the pm. He did have labs last week at PCP but has not heard from them. His weight at d/c was 243 and yesterday 234.6. Per daughter his weight has been dropping about a pound a night lately. She is very concerned he is going to fall. She wants to hold Lasix tonight since he is up so often during night urinating. Will forward to Dr Jens Som and Harrell Lark PA for review.

## 2018-03-24 NOTE — Telephone Encounter (Signed)
OK to hold lasix until weight increases to 238 lb, then resume MCr

## 2018-03-24 NOTE — Telephone Encounter (Signed)
Hold lasix for 2 doses and then decrease to 40 mg BID; bmet one week Olga Millers

## 2018-03-25 MED ORDER — FUROSEMIDE 40 MG PO TABS: 40.0000 mg | ORAL_TABLET | Freq: Every day | ORAL | 3 refills | Status: DC

## 2018-03-25 MED ORDER — FUROSEMIDE 40 MG PO TABS: 40.0000 mg | ORAL_TABLET | Freq: Two times a day (BID) | ORAL | 3 refills | Status: DC

## 2018-03-25 MED ORDER — AMIODARONE HCL 200 MG PO TABS: 200.0000 mg | ORAL_TABLET | Freq: Every day | ORAL | 3 refills | Status: DC

## 2018-03-25 NOTE — Telephone Encounter (Signed)
Spoke with pt dtr, aware of medication changes. Refills sent to [facility]. Lab orders mailed to the pt

## 2018-03-26 ENCOUNTER — Ambulatory Visit: Payer: Medicare HMO | Admitting: Podiatry

## 2018-03-26 DIAGNOSIS — I130 Hypertensive heart and chronic kidney disease with heart failure and stage 1 through stage 4 chronic kidney disease, or unspecified chronic kidney disease: Secondary | ICD-10-CM | POA: Diagnosis not present

## 2018-03-26 DIAGNOSIS — E1122 Type 2 diabetes mellitus with diabetic chronic kidney disease: Secondary | ICD-10-CM | POA: Diagnosis not present

## 2018-03-26 DIAGNOSIS — I5043 Acute on chronic combined systolic (congestive) and diastolic (congestive) heart failure: Secondary | ICD-10-CM | POA: Diagnosis not present

## 2018-03-26 DIAGNOSIS — I482 Chronic atrial fibrillation: Secondary | ICD-10-CM | POA: Diagnosis not present

## 2018-03-26 DIAGNOSIS — N183 Chronic kidney disease, stage 3 (moderate): Secondary | ICD-10-CM | POA: Diagnosis not present

## 2018-03-26 DIAGNOSIS — I429 Cardiomyopathy, unspecified: Secondary | ICD-10-CM | POA: Diagnosis not present

## 2018-03-28 ENCOUNTER — Ambulatory Visit (INDEPENDENT_AMBULATORY_CARE_PROVIDER_SITE_OTHER): Payer: Medicare HMO | Admitting: Internal Medicine

## 2018-03-28 DIAGNOSIS — Z5181 Encounter for therapeutic drug level monitoring: Secondary | ICD-10-CM | POA: Diagnosis not present

## 2018-03-28 DIAGNOSIS — I429 Cardiomyopathy, unspecified: Secondary | ICD-10-CM | POA: Diagnosis not present

## 2018-03-28 DIAGNOSIS — I130 Hypertensive heart and chronic kidney disease with heart failure and stage 1 through stage 4 chronic kidney disease, or unspecified chronic kidney disease: Secondary | ICD-10-CM | POA: Diagnosis not present

## 2018-03-28 DIAGNOSIS — I4821 Permanent atrial fibrillation: Secondary | ICD-10-CM

## 2018-03-28 DIAGNOSIS — I482 Chronic atrial fibrillation: Secondary | ICD-10-CM

## 2018-03-28 DIAGNOSIS — I5043 Acute on chronic combined systolic (congestive) and diastolic (congestive) heart failure: Secondary | ICD-10-CM | POA: Diagnosis not present

## 2018-03-28 DIAGNOSIS — E1122 Type 2 diabetes mellitus with diabetic chronic kidney disease: Secondary | ICD-10-CM | POA: Diagnosis not present

## 2018-03-28 DIAGNOSIS — N183 Chronic kidney disease, stage 3 (moderate): Secondary | ICD-10-CM | POA: Diagnosis not present

## 2018-03-28 LAB — POCT INR: INR: 4.2

## 2018-04-01 ENCOUNTER — Emergency Department (HOSPITAL_COMMUNITY)
Admission: EM | Admit: 2018-04-01 | Discharge: 2018-04-02 | Disposition: A | Discharge status: Home / Self Care | Payer: Medicare HMO | Attending: Emergency Medicine | Admitting: Emergency Medicine

## 2018-04-01 ENCOUNTER — Encounter (HOSPITAL_COMMUNITY): Payer: Self-pay | Admitting: *Deleted

## 2018-04-01 ENCOUNTER — Other Ambulatory Visit: Payer: Self-pay

## 2018-04-01 ENCOUNTER — Emergency Department (HOSPITAL_COMMUNITY): Payer: Medicare HMO

## 2018-04-01 ENCOUNTER — Telehealth: Payer: Self-pay

## 2018-04-01 DIAGNOSIS — Z7901 Long term (current) use of anticoagulants: Secondary | ICD-10-CM | POA: Diagnosis not present

## 2018-04-01 DIAGNOSIS — I5022 Chronic systolic (congestive) heart failure: Secondary | ICD-10-CM | POA: Insufficient documentation

## 2018-04-01 DIAGNOSIS — R42 Dizziness and giddiness: Secondary | ICD-10-CM

## 2018-04-01 DIAGNOSIS — Z96653 Presence of artificial knee joint, bilateral: Secondary | ICD-10-CM | POA: Diagnosis not present

## 2018-04-01 DIAGNOSIS — E119 Type 2 diabetes mellitus without complications: Secondary | ICD-10-CM | POA: Diagnosis not present

## 2018-04-01 DIAGNOSIS — Z79899 Other long term (current) drug therapy: Secondary | ICD-10-CM | POA: Diagnosis not present

## 2018-04-01 DIAGNOSIS — R404 Transient alteration of awareness: Secondary | ICD-10-CM | POA: Diagnosis not present

## 2018-04-01 DIAGNOSIS — I110 Hypertensive heart disease with heart failure: Secondary | ICD-10-CM | POA: Diagnosis not present

## 2018-04-01 LAB — COMPREHENSIVE METABOLIC PANEL
ALT: 17 U/L (ref 17–63)
AST: 22 U/L (ref 15–41)
Albumin: 3.5 g/dL (ref 3.5–5.0)
Alkaline Phosphatase: 59 U/L (ref 38–126)
Anion gap: 11 (ref 5–15)
BILIRUBIN TOTAL: 1.1 mg/dL (ref 0.3–1.2)
BUN: 31 mg/dL — AB (ref 6–20)
CO2: 24 mmol/L (ref 22–32)
CREATININE: 1.3 mg/dL — AB (ref 0.61–1.24)
Calcium: 9.2 mg/dL (ref 8.9–10.3)
Chloride: 100 mmol/L — ABNORMAL LOW (ref 101–111)
GFR, EST AFRICAN AMERICAN: 55 mL/min — AB (ref >60)
GFR, EST NON AFRICAN AMERICAN: 48 mL/min — AB (ref >60)
Glucose, Bld: 144 mg/dL — ABNORMAL HIGH (ref 65–99)
Potassium: 4.3 mmol/L (ref 3.5–5.1)
Sodium: 135 mmol/L (ref 135–145)
TOTAL PROTEIN: 6.2 g/dL — AB (ref 6.5–8.1)

## 2018-04-01 LAB — CBC
HEMATOCRIT: 33.4 % — AB (ref 39.0–52.0)
Hemoglobin: 11 g/dL — ABNORMAL LOW (ref 13.0–17.0)
MCH: 32 pg (ref 26.0–34.0)
MCHC: 32.9 g/dL (ref 30.0–36.0)
MCV: 97.1 fL (ref 78.0–100.0)
Platelets: 131 10*3/uL — ABNORMAL LOW (ref 150–400)
RBC: 3.44 MIL/uL — ABNORMAL LOW (ref 4.22–5.81)
RDW: 14.7 % (ref 11.5–15.5)
WBC: 7.3 10*3/uL (ref 4.0–10.5)

## 2018-04-01 LAB — PROTIME-INR
INR: 3.36
PROTHROMBIN TIME: 33.7 s — AB (ref 11.4–15.2)

## 2018-04-01 LAB — I-STAT TROPONIN, ED: Troponin i, poc: 0.01 ng/mL (ref 0.00–0.08)

## 2018-04-01 LAB — BRAIN NATRIURETIC PEPTIDE: B NATRIURETIC PEPTIDE 5: 156.2 pg/mL — AB (ref 0.0–100.0)

## 2018-04-01 NOTE — Telephone Encounter (Signed)
[facility], clarified that patient was now taking Lisinopril 5mg , they understood and had no questions or concerns.

## 2018-04-01 NOTE — ED Provider Notes (Signed)
[facility] EMERGENCY DEPARTMENT Provider Note   CSN: [id number] Arrival date & time: 04/01/18  1502     History   Chief Complaint Chief Complaint  Patient presents with  . Dizziness    HPI [name] is a 84 y.o. male presenting for evaluation of dizziness.  Patient states that a month ago he was admitted to the hospital for acute laceration of CHF.  He was placed on multiple new medications including Lasix and amiodarone.  For the past 3 weeks, he has had increasing dizziness.  He describes it as the room is spinning and he feels off balance.  This is intermittent, and more likely to happen when he is ambulating.  He reports he is getting dizzy to the point where he feels like he is about to pass out.  He has been having difficulty obtaining the correct PT/INR, but has usually been supratherapeutic.  He denies fevers, chills, shortness of breath, chest pain, cough, nausea, vomiting, abdominal pain, urinary symptoms, abnormal bowel movements.  He weighs himself daily, and it has been stable.  He denies increased leg swelling or pain.  He has been taking all his medications as prescribed.  He has not felt his pacemaker/aicd go off.  He reports he is drinking lots of water and has a normal appetite. Pt sees Dr. Jens Som, and Eyvonne Left.  HPI  Past Medical History:  Diagnosis Date  . Anemia   . Atrial fibrillation (HCC)   . BPH (benign prostatic hyperplasia)   . Bradycardia   . Cardiomyopathy   . CHF (congestive heart failure) (HCC)   . Diabetes mellitus type II   . GERD (gastroesophageal reflux disease)   . History of colonoscopy   . HTN (hypertension)   . Hyperlipidemia   . ICD (implantable cardiac defibrillator) in place 10/14/2012   biventricular  . OA (osteoarthritis)   . Obesity   . Peptic ulcer     Patient Active Problem List   Diagnosis Date Noted  . 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 Bigeminy 02/23/2018  . Cough 12/16/2017  . Peptic ulcer     . Obesity   . OA (osteoarthritis)   . Hyperlipidemia   . HTN (hypertension)   . History of colonoscopy   . GERD (gastroesophageal reflux disease)   . CHF (congestive heart failure) (HCC)   . Bradycardia   . BPH (benign prostatic hyperplasia)   . Hearing loss 01/11/2017  . Vitamin D deficiency 06/29/2015  . Systolic CHF, chronic (HCC) 02/21/2015  . S/P left TKA 02/15/2015  . S/P knee replacement 02/15/2015  . Preop cardiovascular exam 02/02/2015  . Encounter for therapeutic drug monitoring 03/31/2014  . Automatic implantable cardioverter-defibrillator in situ 10/16/2012  . Disorder of liver 04/14/2012  . Screening for prostate cancer 04/11/2012  . Encounter for long-term (current) use of other medications 04/11/2012  . Long term (current) use of anticoagulants 02/25/2012  . Diabetes (HCC) 10/18/2010  . MEMORY LOSS 08/30/2010  . HYPOKALEMIA 07/31/2010  . Thrombocytopenia (HCC) 06/20/2009  . Hyperuricemia 06/20/2009  . OTHER PRIMARY CARDIOMYOPATHIES 04/29/2009  . Dyslipidemia 02/21/2009  . BRADYCARDIA 02/21/2009  . CHRON/UNSPEC PEPTC ULCER UNSPEC SITE W/PERF&OBST 02/21/2009  . LOC OSTEOARTHROS NOT SPEC PRIM/SEC UNSPEC SITE 02/21/2009  . EDEMA 02/21/2009  . NEPHROPATHY, DIABETIC 03/16/2008  . Atrial fibrillation (HCC) 03/16/2008  . Anemia, iron deficiency 08/09/2007  . Essential hypertension 08/09/2007  . MYOCARDIAL INFARCTION, HX OF 08/09/2007  . Osteoarthritis 08/09/2007    Past Surgical History:  Procedure  Laterality Date  . EP IMPLANTABLE DEVICE Left   . EP IMPLANTABLE DEVICE N/A 07/27/2016   Procedure: BIV Pacemaker downgrade;  Surgeon: Marinus Maw, MD;  Location: [facility] INVASIVE CV LAB;  Service: Cardiovascular;  Laterality: N/A;  . ESOPHAGOGASTRODUODENOSCOPY  06/24/2002  . L-spine  1965  . Lumbar L4-5 & S1  02/2000  . TOTAL KNEE ARTHROPLASTY  1997   right  . TOTAL KNEE ARTHROPLASTY Left 02/15/2015   Procedure: LEFT TOTAL KNEE ARTHROPLASTY;  Surgeon: Durene Romans, MD;   Location: [facility] ORS;  Service: Orthopedics;  Laterality: Left;        Home Medications    Prior to Admission medications   Medication Sig Start Date End Date Taking? Authorizing Provider  acetaminophen (TYLENOL) 650 MG CR tablet Take 650-1,300 mg by mouth every 8 (eight) hours as needed for pain.    [provider]  allopurinol (ZYLOPRIM) 100 MG tablet TAKE 1 TABLET EVERY DAY Patient taking differently: TAKE 1 TABLET (100mg ) EVERY DAY 12/14/17   Romero Belling, MD  amiodarone (PACERONE) 200 MG tablet Take 1 tablet (200 mg total) by mouth daily. 03/25/18   Lewayne Bunting, MD  atorvastatin (LIPITOR) 20 MG tablet TAKE 1 TABLET EVERY DAY Patient taking differently: TAKE 1 TABLET (20mg ) EVERY DAY 01/08/18   Lewayne Bunting, MD  cholecalciferol (VITAMIN D) 1000 units tablet Take 1,000 Units by mouth daily.    [provider]  furosemide (LASIX) 40 MG tablet Take 1 tablet (40 mg total) by mouth 2 (two) times daily. 03/25/18   Lewayne Bunting, MD  lisinopril (PRINIVIL,ZESTRIL) 5 MG tablet Take 0.5 tablets (2.5 mg total) by mouth daily. 03/13/18   Lewayne Bunting, MD  magnesium oxide (MAG-OX) 400 (241.3 Mg) MG tablet Take 1 tablet (400 mg total) by mouth 2 (two) times daily. 03/13/18   Lewayne Bunting, MD  metoprolol tartrate (LOPRESSOR) 25 MG tablet Take 0.5 tablets (12.5 mg total) by mouth 2 (two) times daily. 03/20/18   Romero Belling, MD  polyethylene glycol [facility] Ethelene Hal) packet Take 17 g by mouth daily as needed (constipation).    [provider]  potassium chloride SA (K-DUR,KLOR-CON) 20 MEQ tablet Take 20 mEq by mouth daily.    [provider]  tamsulosin (FLOMAX) 0.4 MG CAPS capsule TAKE 1 CAPSULE TWICE DAILY Patient taking differently: TAKE 1 CAPSULE (0.4mg ) TWICE DAILY 10/02/17   Romero Belling, MD  warfarin (COUMADIN) 5 MG tablet Take 0.5 tablets (2.5 mg total) by mouth one time only at 6 PM. Patient taking differently: Take 2.5 mg by mouth one time  only at 6 PM. Medication is given based off of INR that is done on Tuesdays 02/23/18   Duke, Roe Rutherford, PA    Family History Family History  Problem Relation Age of Onset  . Heart disease Father   . Heart disease Mother   . Diabetes Mother   . Hypertension Brother   . Hypertension Brother   . Cancer Neg Hx     Social History Social History   Tobacco Use  . Smoking status: Never Smoker  . Smokeless tobacco: Never Used  Substance Use Topics  . Alcohol use: No  . Drug use: No     Allergies   Patient has no known allergies.   Review of Systems Review of Systems  Constitutional: Negative for chills and fever.  HENT: Negative for congestion.   Respiratory: Negative for cough, chest tightness and shortness of breath.   Cardiovascular: Negative for  chest pain.  Gastrointestinal: Negative for abdominal pain, nausea and vomiting.  Genitourinary: Negative for dysuria, frequency and hematuria.  Musculoskeletal: Negative for back pain.  Skin: Negative for rash.  Neurological: Positive for dizziness and light-headedness. Negative for seizures, syncope and weakness.  Hematological: Bruises/bleeds easily.  Psychiatric/Behavioral: Negative for confusion.     Physical Exam Updated Vital Signs BP 114/64   Pulse (!) 56   Temp (!) 97.5 F (36.4 C) (Oral)   Resp 19   Ht 5' 8.5" (1.74 m)   Wt 105.3 kg (232 lb 2 oz)   SpO2 98%   BMI 34.78 kg/m   Physical Exam  Constitutional: He is oriented to person, place, and time. He appears well-developed and well-nourished. No distress.  Patient appears in no distress.  HENT:  Head: Normocephalic and atraumatic.  Right Ear: Tympanic membrane, external ear and ear canal normal.  Left Ear: External ear and ear canal normal.  Nose: Nose normal.  Mouth/Throat: Uvula is midline, oropharynx is clear and moist and mucous membranes are normal.  Eyes: Pupils are equal, round, and reactive to light. Conjunctivae and EOM are normal.  EOMI  and PERRLA.  No nystagmus.  Neck: Normal range of motion. Neck supple.  Cardiovascular: Normal rate, regular rhythm and intact distal pulses.  Pulmonary/Chest: Effort normal and breath sounds normal. No respiratory distress. He has no wheezes.  Speaking in full sentences.  Clear lung sounds.  Abdominal: Soft. He exhibits no distension and no mass. There is no tenderness. There is no guarding.  Musculoskeletal: Normal range of motion.  Mild bilateral nonpitting edema.  Good pedal pulses.  Chronic skin changes bilaterally.   Neurological: He is alert and oriented to person, place, and time. No sensory deficit.  Skin: Skin is warm and dry.  Psychiatric: He has a normal mood and affect.  Nursing note and vitals reviewed.    ED Treatments / Results  Labs (all labs ordered are listed, but only abnormal results are displayed) Labs Reviewed  CBC - Abnormal; Notable for the following components:      Result Value   RBC 3.44 (*)    Hemoglobin 11.0 (*)    HCT 33.4 (*)    Platelets 131 (*)    All other components within normal limits  COMPREHENSIVE METABOLIC PANEL - Abnormal; Notable for the following components:   Chloride 100 (*)    Glucose, Bld 144 (*)    BUN 31 (*)    Creatinine, Ser 1.30 (*)    Total Protein 6.2 (*)    GFR calc non Af Amer 48 (*)    GFR calc Af Amer 55 (*)    All other components within normal limits  BRAIN NATRIURETIC PEPTIDE - Abnormal; Notable for the following components:   B Natriuretic Peptide 156.2 (*)    All other components within normal limits  PROTIME-INR - Abnormal; Notable for the following components:   Prothrombin Time 33.7 (*)    All other components within normal limits  I-STAT TROPONIN, ED    EKG EKG Interpretation  Date/Time:  Tuesday April 01 2018 15:10:42 EDT Ventricular Rate:  61 PR Interval:    QRS Duration: 228 QT Interval:  523 QTC Calculation: 527 R Axis:   -94 Text Interpretation:  Age not entered, assumed to be  84 years old  for purpose of ECG interpretation Complete AV block with wide QRS complex Right bundle branch block bigeminy on previous tracing has resolved; wide complex rhythm with RBBB Confirmed by Frederick Peers (  [id number]) on 04/01/2018 3:25:17 PM Also confirmed by Frederick Peers [phone]), editor Sheppard Evens ([id number])  on 04/01/2018 4:12:11 PM   Radiology Ct Head Wo Contrast  Result Date: 04/01/2018 CLINICAL DATA:  84 year old male with 3 week history of dizziness getting worse. Initial encounter. EXAM: CT HEAD WITHOUT CONTRAST TECHNIQUE: Contiguous axial images were obtained from the base of the skull through the vertex without intravenous contrast. COMPARISON:  None. FINDINGS: Brain: No intracranial hemorrhage or CT evidence of large acute infarct. Mild chronic microvascular changes. Mild global atrophy. Focal calcification left aspect anterior falx. Meningioma secondary less likely consideration. Otherwise no intracranial mass lesion noted on this exam. Vascular: Vascular calcifications Skull: Tiny osteoma right frontal calvarium. Sinuses/Orbits: No acute orbital abnormality. Opacification/mucosal thickening ethmoid sinus air cells bilaterally. Mucosal thickening maxillary sinuses greater on left. Partial opacification inferior right frontal sinus. Sphenoid sinuses are clear. Other: Mastoid air cells and middle ear cavities are clear. IMPRESSION: No acute intracranial abnormality. Chronic microvascular changes. Atrophy. Focal calcification left aspect anterior falx. Small meningioma secondary less likely consideration. Opacification/mucosal thickening ethmoid sinus air cells bilaterally. Mucosal thickening maxillary sinuses greater on left. Partial opacification inferior right frontal sinus. Electronically Signed   By: Lacy Duverney M.D.   On: 04/01/2018 15:50    Procedures Procedures (including critical care time)  Medications Ordered in ED Medications - No data to display   Initial Impression / Assessment and  Plan / ED Course  I have reviewed the triage vital signs and the nursing notes.  Pertinent labs & imaging results that were available during my care of the patient were reviewed by me and considered in my medical decision making (see chart for details).     Presenting for evaluation of dizziness.  Physical exam shows patient who appears nontoxic.  No obvious neurologic deficits.  Recent hospitalization for CHF, and multiple medication changes.  Will obtain labs, EKG, CT head. Will obtain orthostatic VS.  Labs reassuring, hemoglobin stable.  Creatinine improving.  No significant dehydration.  Troponin negative, BNP improved from prior.  PT/INR mildly supratherapeutic at 3.36, improved from >4 on Friday. CT head without sign of bleeding.  Blood pressure decreasing while patient is sitting, to 90 systolic.  Orthostatic vital signs show significant increase in heart rate while patient is lying, and heart rate dropped by half when patient is standing.  Discussed with attending, Dr. Clarene Duke evaluated inpatient.  Will consult with cardiology.  Discussed with Dr. Allena Katz, who recommends d/c amiodarone and f/u in clinic. Does not believe pt needs to be admitted at this time.  Discussed plan with patient and daughter.  Recommended bedside urinal for tonight, as patient states he feels most dizzy in the middle the night.  Encouraged follow-up with cardiology.  At this time, patient appears safe for discharge.  Return precautions given.  Patient states he understands and agrees to plan.  Final Clinical Impressions(s) / ED Diagnoses   Final diagnoses:  Dizziness    ED Discharge Orders    None       Alveria Apley, PA-C 04/01/18 2038    Little, Ambrose Finland, MD 04/09/18 [phone]

## 2018-04-01 NOTE — Discharge Instructions (Addendum)
Stop taking the amiodarone.  Continue taking all your other medications. The cardiologist's office will contact you to set up an appointment. Return to the emergency room if you develop chest pain, shortness of breath, or any new or concerning symptoms.

## 2018-04-01 NOTE — ED Notes (Signed)
Pt verbalizes understanding of d/c instructions. Pt waiting for PTAR.

## 2018-04-01 NOTE — ED Triage Notes (Signed)
Pt from Home with reported worsening dizziness  Last night and this morning. Pt reports the dizziness started  3 weeks ago. Wife worried Pt may fall.

## 2018-04-02 NOTE — ED Notes (Signed)
Pt taken on stretcher with PTAR at d/c with all belongings.

## 2018-04-04 ENCOUNTER — Telehealth: Payer: Self-pay

## 2018-04-04 ENCOUNTER — Ambulatory Visit (INDEPENDENT_AMBULATORY_CARE_PROVIDER_SITE_OTHER): Payer: Medicare HMO | Admitting: Internal Medicine

## 2018-04-04 DIAGNOSIS — E1122 Type 2 diabetes mellitus with diabetic chronic kidney disease: Secondary | ICD-10-CM | POA: Diagnosis not present

## 2018-04-04 DIAGNOSIS — I482 Chronic atrial fibrillation: Secondary | ICD-10-CM | POA: Diagnosis not present

## 2018-04-04 DIAGNOSIS — I130 Hypertensive heart and chronic kidney disease with heart failure and stage 1 through stage 4 chronic kidney disease, or unspecified chronic kidney disease: Secondary | ICD-10-CM | POA: Diagnosis not present

## 2018-04-04 DIAGNOSIS — I429 Cardiomyopathy, unspecified: Secondary | ICD-10-CM | POA: Diagnosis not present

## 2018-04-04 DIAGNOSIS — Z5181 Encounter for therapeutic drug level monitoring: Secondary | ICD-10-CM

## 2018-04-04 DIAGNOSIS — I5043 Acute on chronic combined systolic (congestive) and diastolic (congestive) heart failure: Secondary | ICD-10-CM | POA: Diagnosis not present

## 2018-04-04 DIAGNOSIS — I4821 Permanent atrial fibrillation: Secondary | ICD-10-CM

## 2018-04-04 DIAGNOSIS — N183 Chronic kidney disease, stage 3 (moderate): Secondary | ICD-10-CM | POA: Diagnosis not present

## 2018-04-04 LAB — PROTIME-INR

## 2018-04-04 LAB — POCT INR: INR: 4.5

## 2018-04-04 MED ORDER — WARFARIN SODIUM 2.5 MG PO TABS: 2.5000 mg | ORAL_TABLET | ORAL | 3 refills | Status: DC

## 2018-04-04 NOTE — Telephone Encounter (Signed)
ok 

## 2018-04-04 NOTE — Telephone Encounter (Signed)
I called [facility] & let Arline Asp know that orders for PT were resubmitted.

## 2018-04-04 NOTE — Telephone Encounter (Signed)
Patient initially declined physical therapy but has decided he wants to try it now and [facility] is requesting new orders be placed for this- good call back number is [phone]

## 2018-04-07 NOTE — Progress Notes (Signed)
HPI: FU nonischemic cardiomyopathy as well as atrial fibrillation. Note, he had a catheterization in January 2006 that showed no coronary artery disease and an ejection fraction of 40%. He had a Myoview performed last on May 04, 2008, that showed an ejection fraction of 31%. There was felt to be a prior inferior infarct with mild peri-infarct ischemia. I did review this and felt there was a low risk and we have been treating medically. He does have permanent atrial fibrillation as well. Holter monitor in August of 2011 showed mildly reduced rate and we decreased his Coreg. In November of 2013 the patient had a biventricular ICD placed; downgraded to BIV pacemaker 8/17. Echocardiogram March 2019 showed ejection fraction 40 to 45%, moderate left ventricular hypertrophy, moderate left ventricular enlargement, mild to moderate mitral regurgitation, severe biatrial enlargement.  Patient admitted March 17 with acute on chronic combined systolic/diastolic congestive heart failure.  Patient was diuresed with improvement.  Amiodarone was initiated to suppress PVCs.  However he was seen in the emergency room with dizziness and amiodarone discontinued.  Since I last saw him,patient denies dyspnea, chest pain.  He describes significant dizziness.  His symptoms worsen after taking his a.m. medications and also with standing.  He has not had syncope.  Current Outpatient Medications  Medication Sig Dispense Refill  . acetaminophen (TYLENOL) 650 MG CR tablet Take 650-1,300 mg by mouth every 8 (eight) hours as needed for pain.    [name] allopurinol (ZYLOPRIM) 100 MG tablet TAKE 1 TABLET EVERY DAY (Patient taking differently: TAKE 1 TABLET (100mg ) EVERY DAY) 90 tablet 1  . atorvastatin (LIPITOR) 20 MG tablet TAKE 1 TABLET EVERY DAY (Patient taking differently: TAKE 1 TABLET (20mg ) EVERY DAY) 90 tablet 3  . cholecalciferol (VITAMIN D) 1000 units tablet Take 1,000 Units by mouth daily.    . furosemide (LASIX) 40 MG tablet  Take 1 tablet (40 mg total) by mouth 2 (two) times daily. 180 tablet 3  . lisinopril (PRINIVIL,ZESTRIL) 5 MG tablet Take 0.5 tablets (2.5 mg total) by mouth daily. 90 tablet 3  . magnesium oxide (MAG-OX) 400 (241.3 Mg) MG tablet Take 1 tablet (400 mg total) by mouth 2 (two) times daily. 90 tablet 3  . metoprolol tartrate (LOPRESSOR) 25 MG tablet Take 0.5 tablets (12.5 mg total) by mouth 2 (two) times daily. 90 tablet 3  . polyethylene glycol (MIRALAX / GLYCOLAX) packet Take 17 g by mouth daily as needed (constipation).    . potassium chloride SA (K-DUR,KLOR-CON) 20 MEQ tablet Take 20 mEq by mouth daily.    . tamsulosin (FLOMAX) 0.4 MG CAPS capsule TAKE 1 CAPSULE TWICE DAILY (Patient taking differently: TAKE 1 CAPSULE (0.4mg ) TWICE DAILY) 180 capsule 2  . warfarin (COUMADIN) 2.5 MG tablet Take 1 tablet (2.5 mg total) by mouth as directed. 30 tablet 3   No current facility-administered medications for this visit.      Past Medical History:  Diagnosis Date  . Anemia   . Atrial fibrillation (HCC)   . BPH (benign prostatic hyperplasia)   . Bradycardia   . Cardiomyopathy   . CHF (congestive heart failure) (HCC)   . Diabetes mellitus type II   . GERD (gastroesophageal reflux disease)   . History of colonoscopy   . HTN (hypertension)   . Hyperlipidemia   . ICD (implantable cardiac defibrillator) in place 10/14/2012   biventricular  . OA (osteoarthritis)   . Obesity   . Peptic ulcer     Past Surgical History:  Procedure Laterality Date  . EP IMPLANTABLE DEVICE Left   . EP IMPLANTABLE DEVICE N/A 07/27/2016   Procedure: BIV Pacemaker downgrade;  Surgeon: Marinus Maw, MD;  Location: [facility] INVASIVE CV LAB;  Service: Cardiovascular;  Laterality: N/A;  . ESOPHAGOGASTRODUODENOSCOPY  06/24/2002  . L-spine  1965  . Lumbar L4-5 & S1  02/2000  . TOTAL KNEE ARTHROPLASTY  1997   right  . TOTAL KNEE ARTHROPLASTY Left 02/15/2015   Procedure: LEFT TOTAL KNEE ARTHROPLASTY;  Surgeon: Durene Romans, MD;   Location: [facility] ORS;  Service: Orthopedics;  Laterality: Left;    Social History   Socioeconomic History  . Marital status: Widowed    Spouse name: Not on file  . Number of children: Not on file  . Years of education: Not on file  . Highest education level: Not on file  Occupational History  . Occupation: Retired  Engineer, production  . Financial resource strain: Not on file  . Food insecurity:    Worry: Not on file    Inability: Not on file  . Transportation needs:    Medical: Not on file    Non-medical: Not on file  Tobacco Use  . Smoking status: Never Smoker  . Smokeless tobacco: Never Used  Substance and Sexual Activity  . Alcohol use: No  . Drug use: No  . Sexual activity: Not on file  Lifestyle  . Physical activity:    Days per week: Not on file    Minutes per session: Not on file  . Stress: Not on file  Relationships  . Social connections:    Talks on phone: Not on file    Gets together: Not on file    Attends religious service: Not on file    Active member of club or organization: Not on file    Attends meetings of clubs or organizations: Not on file    Relationship status: Not on file  . Intimate partner violence:    Fear of current or ex partner: Not on file    Emotionally abused: Not on file    Physically abused: Not on file    Forced sexual activity: Not on file  Other Topics Concern  . Not on file  Social History Narrative  . Not on file    Family History  Problem Relation Age of Onset  . Heart disease Father   . Heart disease Mother   . Diabetes Mother   . Hypertension Brother   . Hypertension Brother   . Cancer Neg Hx     ROS: no fevers or chills, productive cough, hemoptysis, dysphasia, odynophagia, melena, hematochezia, dysuria, hematuria, rash, seizure activity, orthopnea, PND, pedal edema, claudication. Remaining systems are negative.  Physical Exam: Well-developed well-nourished in no acute distress.  Skin is warm and dry.  HEENT is normal.   Neck is supple.  Chest is clear to auscultation with normal expansion.  Cardiovascular exam is irregular Abdominal exam nontender or distended. No masses palpated. Extremities show trace to 1+ edema; chronic skin changes. neuro grossly intact   A/P  1 chronic combined systolic/diastolic congestive heart failure-patient is complaining of significant dizziness/orthostatic symptoms.  Decrease Lasix to 40 mg in the morning with an additional 40 mg in the evening for weight gain of 3 pounds or increasing lower extremity edema.  Check potassium and renal function.  Continue fluid restriction and low-sodium diet.  2 permanent atrial fibrillation-patient complains of significant dizziness and blood pressure is low.  Discontinue metoprolol.  Continue Coumadin.  3 PVCs-amiodarone was recently discontinued because of feelings of dizziness.  We will continue off for now.  4 Cardiomyopathy-blood pressure is low.  Significant orthostatic symptoms.  Discontinue lisinopril and beta-blocker and follow blood pressure.  5 hypertension-blood pressure is low.  Plan as outlined above.  6 prior ICD-followed by electrophysiology.  7 dizziness-seems predominantly orthostatic related.  His symptoms worsen after taking a.m. medications and standing.  Systolic blood pressure 90.  Discontinue lisinopril and metoprolol.  Decrease Lasix.  Follow symptoms and adjust regimen as needed.  Recent CT scan April 23 showed no bleed.  Will have patient follow-up next week with PA to make sure he is stable.  Patient and daughter feel comfortable that he can be managed at home at present.  Olga Millers, MD

## 2018-04-10 ENCOUNTER — Encounter: Payer: Self-pay | Admitting: Cardiology

## 2018-04-10 ENCOUNTER — Ambulatory Visit: Payer: Medicare HMO | Admitting: Cardiology

## 2018-04-10 VITALS — BP 90/62 | HR 65 | Ht 68.5 in | Wt 231.8 lb

## 2018-04-10 DIAGNOSIS — I428 Other cardiomyopathies: Secondary | ICD-10-CM | POA: Diagnosis not present

## 2018-04-10 DIAGNOSIS — I482 Chronic atrial fibrillation: Secondary | ICD-10-CM | POA: Diagnosis not present

## 2018-04-10 DIAGNOSIS — I5022 Chronic systolic (congestive) heart failure: Secondary | ICD-10-CM

## 2018-04-10 DIAGNOSIS — I4821 Permanent atrial fibrillation: Secondary | ICD-10-CM

## 2018-04-10 DIAGNOSIS — R42 Dizziness and giddiness: Secondary | ICD-10-CM | POA: Diagnosis not present

## 2018-04-10 DIAGNOSIS — I10 Essential (primary) hypertension: Secondary | ICD-10-CM

## 2018-04-10 DIAGNOSIS — I5042 Chronic combined systolic (congestive) and diastolic (congestive) heart failure: Secondary | ICD-10-CM | POA: Diagnosis not present

## 2018-04-10 LAB — BASIC METABOLIC PANEL
BUN/Creatinine Ratio: 24 (ref 10–24)
BUN: 29 mg/dL — ABNORMAL HIGH (ref 8–27)
CO2: 24 mmol/L (ref 20–29)
CREATININE: 1.22 mg/dL (ref 0.76–1.27)
Calcium: 9.3 mg/dL (ref 8.6–10.2)
Chloride: 100 mmol/L (ref 96–106)
GFR, EST AFRICAN AMERICAN: 61 mL/min/{1.73_m2} (ref >59)
GFR, EST NON AFRICAN AMERICAN: 53 mL/min/{1.73_m2} — AB (ref >59)
Glucose: 121 mg/dL — ABNORMAL HIGH (ref 65–99)
POTASSIUM: 4.4 mmol/L (ref 3.5–5.2)
SODIUM: 138 mmol/L (ref 134–144)

## 2018-04-10 MED ORDER — FUROSEMIDE 40 MG PO TABS: ORAL_TABLET | ORAL | 3 refills | Status: DC

## 2018-04-10 NOTE — Patient Instructions (Signed)
Medication Instructions:   STOP LISINOPRIL  STOP METOPROLOL  DECREASE FUROSEMIDE TO 40 MG ONCE DAILY AND TAKE AN EXTRA 40 MG AS NEEDED FOR SWELLING   Labwork:  Your physician recommends that you HAVE LAB WORK TODAY  Follow-Up:  Your physician recommends that you schedule a follow-up appointment in: ONE WEEK WITH APP  Your physician recommends that you schedule a follow-up appointment in: 3 MONTHS WITH DR Jens Som    If you need a refill on your cardiac medications before your next appointment, please call your pharmacy.

## 2018-04-10 NOTE — Progress Notes (Signed)
Cardiology Office Note   Date:  04/14/2018   ID:  [name], DOB 1929/07/02, MRN [id number]  PCP:  Romero Belling, MD  Cardiologist:  [facility]   Chief Complaint  Patient presents with  . Dizziness  . Atrial Fibrillation  . Hypertension     History of Present Illness: [name] is a 84 y.o. male who presents for close follow up after being seen by Dr. Jens Kim 3 days ago for complaints of dizziness and was diagnosed with orthostatic hypotension. Systolic BP was 90 mmHg. Lisinopril and metoprolol was discontinued, lasix was decreased to 40 mg in the am.   She has a history of chromic combined systolic/diastolic CHF, permanent atrial fibrillation on coumadin, hypertension, cardiomyopathy EF of 40%-45%. ICD in situ followed by Dr.Taylor.   He is feeling significantly better with above medication changes. The dizziness is almost eliminated with the exception of having a mild occurrence when he first stands up which goes away in seconds. He is grateful for the changes in medications.   He is interested in changing from coumadin to a DOAC as discussed with him by Dr.Crenshaw in the past. His caregiver who accompanies him to the appointment states that the insurance company will no longer pay for a HHN to take INR and call in results. He denies bleeding or melena on coumadin.   Past Medical History:  Diagnosis Date  . Anemia   . Atrial fibrillation (HCC)   . BPH (benign prostatic hyperplasia)   . Bradycardia   . Cardiomyopathy   . CHF (congestive heart failure) (HCC)   . Diabetes mellitus type II   . GERD (gastroesophageal reflux disease)   . History of colonoscopy   . HTN (hypertension)   . Hyperlipidemia   . ICD (implantable cardiac defibrillator) in place 10/14/2012   biventricular  . OA (osteoarthritis)   . Obesity   . Peptic ulcer     Past Surgical History:  Procedure Laterality Date  . EP IMPLANTABLE DEVICE Left   . EP IMPLANTABLE DEVICE N/A 07/27/2016     Procedure: BIV Pacemaker downgrade;  Surgeon: Marinus Maw, MD;  Location: [facility] INVASIVE CV LAB;  Service: Cardiovascular;  Laterality: N/A;  . ESOPHAGOGASTRODUODENOSCOPY  06/24/2002  . L-spine  1965  . Lumbar L4-5 & S1  02/2000  . TOTAL KNEE ARTHROPLASTY  1997   right  . TOTAL KNEE ARTHROPLASTY Left 02/15/2015   Procedure: LEFT TOTAL KNEE ARTHROPLASTY;  Surgeon: Durene Romans, MD;  Location: [facility] ORS;  Service: Orthopedics;  Laterality: Left;     Current Outpatient Medications  Medication Sig Dispense Refill  . acetaminophen (TYLENOL) 650 MG CR tablet Take 650-1,300 mg by mouth every 8 (eight) hours as needed for pain.    [name] allopurinol (ZYLOPRIM) 100 MG tablet TAKE 1 TABLET EVERY DAY (Patient taking differently: TAKE 1 TABLET (100mg ) EVERY DAY) 90 tablet 1  . atorvastatin (LIPITOR) 20 MG tablet TAKE 1 TABLET EVERY DAY (Patient taking differently: TAKE 1 TABLET (20mg ) EVERY DAY) 90 tablet 3  . cholecalciferol (VITAMIN D) 1000 units tablet Take 1,000 Units by mouth daily.    . furosemide (LASIX) 40 MG tablet Take 1 tablet once daily and take extra tablet as needed 180 tablet 3  . magnesium oxide (MAG-OX) 400 (241.3 Mg) MG tablet Take 1 tablet (400 mg total) by mouth 2 (two) times daily. 90 tablet 3  . polyethylene glycol (MIRALAX / GLYCOLAX) packet Take 17 g by mouth daily as needed (constipation).    [name]  potassium chloride SA (K-DUR,KLOR-CON) 20 MEQ tablet Take 20 mEq by mouth daily.    . tamsulosin (FLOMAX) 0.4 MG CAPS capsule TAKE 1 CAPSULE TWICE DAILY (Patient taking differently: TAKE 1 CAPSULE (0.4mg ) TWICE DAILY) 180 capsule 2  . warfarin (COUMADIN) 2.5 MG tablet Take 1 tablet (2.5 mg total) by mouth as directed. 30 tablet 3  . apixaban (ELIQUIS) 5 MG TABS tablet Take 1 tablet (5 mg total) by mouth 2 (two) times daily. 60 tablet 5   No current facility-administered medications for this visit.     Allergies:   Patient has no known allergies.    Social History:  The patient  reports that  he has never smoked. He has never used smokeless tobacco. He reports that he does not drink alcohol or use drugs.   Family History:  The patient's family history includes Diabetes in his mother; Heart disease in his father and mother; Hypertension in his brother and brother.    ROS: All other systems are reviewed and negative. Unless otherwise mentioned in H&P    PHYSICAL EXAM: VS:  BP 124/68 (BP Location: Left Arm, Patient Position: Sitting, Cuff Size: Large)   Pulse 80   Ht 5' 8.5" (1.74 m)   Wt 233 lb (105.7 kg)   BMI 34.91 kg/m  , BMI Body mass index is 34.91 kg/m. GEN: Well nourished, well developed, in no acute distress  HEENT: normal  Neck: no JVD, carotid bruits, or masses Cardiac: IRRR; no murmurs, rubs, or gallops,no edema  Respiratory:  Clear to auscultation bilaterally, normal work of breathing GI: soft, nontender, nondistended, + BS MS: no deformity or atrophy Significant venous statis skin thickening and discoloration.  Skin: warm and dry, no rash Neuro:  Strength and sensation are intact Psych: euthymic mood, full affect   EKG:  Not completed on this office visit.   Recent Labs: 02/20/2018: TSH 1.238 02/22/2018: Magnesium 1.5 03/20/2018: Pro B Natriuretic peptide (BNP) 179.0 04/01/2018: ALT 17; B Natriuretic Peptide 156.2; Hemoglobin 11.0; Platelets 131 04/10/2018: BUN 29; Creatinine, Ser 1.22; Potassium 4.4; Sodium 138    Lipid Panel    Component Value Date/Time   CHOL 131 03/20/2018 1516   TRIG 55.0 03/20/2018 1516   HDL 57.20 03/20/2018 1516   CHOLHDL 2 03/20/2018 1516   VLDL 11.0 03/20/2018 1516   LDLCALC 63 03/20/2018 1516      Wt Readings from Last 3 Encounters:  04/14/18 233 lb (105.7 kg)  04/10/18 231 lb 12.8 oz (105.1 kg)  04/01/18 232 lb 2 oz (105.3 kg)      Other studies Reviewed: Echocardiogram Mar 14, 2018 Left ventricle: The cavity size was moderately dilated. Wall   thickness was increased in a pattern of moderate LVH.   Incoordinate  septal motion. Systolic function was mildly to   moderately reduced. The estimated ejection fraction was in the   range of 40% to 45%. Diffuse hypokinesis. The study is not   technically sufficient to allow evaluation of LV diastolic   function. - Aortic valve: Mildly calcified leaflets. There was no stenosis.   There was no regurgitation. - Aorta: Aortic root dimension: 38 mm (ED). - Aortic root: The aortic root was top normal in size. - Mitral valve: Mildly thickened leaflets . There was mild to   moderate regurgitation. - Left atrium: Severely dilated. The atrium was normal in size. - Right ventricle: The cavity size was normal. Wall thickness was   normal. AICD wire noted in right ventricle. Systolic function was   low  normal. - Right atrium: Severely dilated. AICD wire noted in right atrium. - Tricuspid valve: There was trivial regurgitation. - Pulmonary arteries: PA peak pressure: 21 mm Hg (S) + RAP. - Systemic veins: Not visualized.   ASSESSMENT AND PLAN:  1.Orthostatic BP: Much better with almost complete elimination of symptoms with changes in his medication. Feels much better. BP is stable and well controlled.  Will continue the lasix 40 mg in the am.   2. Permanent Atrial fib  Rate controlled He would like to change to DOAC as his insurance company will not pay for [facility] to check any longer. Will change to Eliquis 5 mg BID Creatinine 1.22, GFR 53. Last INR 2.6. I have had pharmacist see him as well on this appointment. Samples are provided to him for Eliquis. Rx sent as well. Will repeat BMET in 2 weeks to evaluate kidney function.   3. Combined CHF: EF 40%-45% Weight has been stable on current dose of diuretics. He is not on ACE or ARB currently  Taken off of ACE due to hypotension on last visit. Continue coreg dose.   4. ICD in situ: EP following.   Current medicines are reviewed at length with the patient today.    Labs/ tests ordered today include: BMET in 2 weeks.    [name], ANP, AACC   04/14/2018 10:21 AM    [facility] [address] [place], [place] [address] Phone: [phone]; Fax: [phone]

## 2018-04-11 ENCOUNTER — Ambulatory Visit (INDEPENDENT_AMBULATORY_CARE_PROVIDER_SITE_OTHER): Payer: Medicare HMO | Admitting: Internal Medicine

## 2018-04-11 DIAGNOSIS — I5043 Acute on chronic combined systolic (congestive) and diastolic (congestive) heart failure: Secondary | ICD-10-CM | POA: Diagnosis not present

## 2018-04-11 DIAGNOSIS — I130 Hypertensive heart and chronic kidney disease with heart failure and stage 1 through stage 4 chronic kidney disease, or unspecified chronic kidney disease: Secondary | ICD-10-CM | POA: Diagnosis not present

## 2018-04-11 DIAGNOSIS — Z5181 Encounter for therapeutic drug level monitoring: Secondary | ICD-10-CM

## 2018-04-11 DIAGNOSIS — I482 Chronic atrial fibrillation: Secondary | ICD-10-CM | POA: Diagnosis not present

## 2018-04-11 DIAGNOSIS — E1122 Type 2 diabetes mellitus with diabetic chronic kidney disease: Secondary | ICD-10-CM | POA: Diagnosis not present

## 2018-04-11 DIAGNOSIS — N183 Chronic kidney disease, stage 3 (moderate): Secondary | ICD-10-CM | POA: Diagnosis not present

## 2018-04-11 DIAGNOSIS — I4821 Permanent atrial fibrillation: Secondary | ICD-10-CM

## 2018-04-11 DIAGNOSIS — I429 Cardiomyopathy, unspecified: Secondary | ICD-10-CM | POA: Diagnosis not present

## 2018-04-11 LAB — POCT INR: INR: 2.6

## 2018-04-11 NOTE — Patient Instructions (Addendum)
Description   Spoke with Vonna Kotyk RN with [facility] while in the home with the pt to instruct pt to continue same dose of coumadin  2.5mg  daily except 1.25mg  on Tuesdays, Thursdays and Saturdays.  Repeat in 1 week. Ask pt if he is wanting to switch to NOAC-note in the black book on last visit. Orders given to Vonna Kotyk RN with [facility] @[phone]. Call us with any concerns or new medications # [phone].  Amiodarone discontinued on April 23rd

## 2018-04-14 ENCOUNTER — Ambulatory Visit: Payer: Medicare HMO | Admitting: Adult Health

## 2018-04-14 ENCOUNTER — Encounter: Payer: Self-pay | Admitting: Adult Health

## 2018-04-14 ENCOUNTER — Telehealth: Payer: Self-pay | Admitting: Pharmacist Clinician (PhC)/ Clinical Pharmacy Specialist

## 2018-04-14 VITALS — BP 124/68 | HR 80 | Ht 68.5 in | Wt 233.0 lb

## 2018-04-14 DIAGNOSIS — I482 Chronic atrial fibrillation: Secondary | ICD-10-CM

## 2018-04-14 DIAGNOSIS — Z79899 Other long term (current) drug therapy: Secondary | ICD-10-CM | POA: Diagnosis not present

## 2018-04-14 DIAGNOSIS — I4821 Permanent atrial fibrillation: Secondary | ICD-10-CM

## 2018-04-14 DIAGNOSIS — I10 Essential (primary) hypertension: Secondary | ICD-10-CM

## 2018-04-14 DIAGNOSIS — I5042 Chronic combined systolic (congestive) and diastolic (congestive) heart failure: Secondary | ICD-10-CM

## 2018-04-14 MED ORDER — APIXABAN 5 MG PO TABS: 5.0000 mg | ORAL_TABLET | Freq: Two times a day (BID) | ORAL | 5 refills | Status: DC

## 2018-04-14 NOTE — Telephone Encounter (Signed)
Patient previously on warfarin, now no longer getting HH and finding it difficult to get to coumadin clinic for INR checks.  Will switch to Eliquis 5 mg bid, based on age 84, SCr 1.22 and weight 105.7 kg.    Reviewed information about safety and bleeding with patient, precautions are same as they were for warfarin, with the exception that vitamin K is no longer a concern.  Still would need to go to ER for head injuries or traumatic events.   Patient and caregiver aware that we will monitor BMET and refill rx every 6 months, and that dose could potentially change at some time based on current dosing guidelines.   All questions answered.     Patient will stop warfarin as of yesterday and start Eliquis 5 mg bid this evening.  They understand the need for the dose to be twice daily, about 10-12 hours apart.

## 2018-04-14 NOTE — Patient Instructions (Addendum)
Medication Instructions:   START ELIQUIS 5MG  TWICE DAILY  If you need a refill on your cardiac medications before your next appointment, please call your pharmacy.  Labwork: BMET 2 WEEKS(~04-28-18) AFTER STARTING ELIQUIS HERE IN OUR OFFICE AT [facility]  Take the provided lab slips with you to the lab for your blood draw.   You will NOT need to fast   Follow-Up: Your physician wants you to follow-up in: 3 MONTHS WITH DR CRENSHAW -OR- KATHRYN LAWRENCE (NURSE PRACTIONIER), DNP,AACC IF PRIMARY CARDIOLOGIST IS UNAVAILABLE.    Thank you for choosing [facility] at [facility]!!

## 2018-04-17 DIAGNOSIS — E1122 Type 2 diabetes mellitus with diabetic chronic kidney disease: Secondary | ICD-10-CM | POA: Diagnosis not present

## 2018-04-17 DIAGNOSIS — I482 Chronic atrial fibrillation: Secondary | ICD-10-CM | POA: Diagnosis not present

## 2018-04-17 DIAGNOSIS — I5043 Acute on chronic combined systolic (congestive) and diastolic (congestive) heart failure: Secondary | ICD-10-CM | POA: Diagnosis not present

## 2018-04-17 DIAGNOSIS — I130 Hypertensive heart and chronic kidney disease with heart failure and stage 1 through stage 4 chronic kidney disease, or unspecified chronic kidney disease: Secondary | ICD-10-CM | POA: Diagnosis not present

## 2018-04-17 DIAGNOSIS — N183 Chronic kidney disease, stage 3 (moderate): Secondary | ICD-10-CM | POA: Diagnosis not present

## 2018-04-17 DIAGNOSIS — I429 Cardiomyopathy, unspecified: Secondary | ICD-10-CM | POA: Diagnosis not present

## 2018-04-18 ENCOUNTER — Telehealth: Payer: Self-pay | Admitting: Endocrinology

## 2018-04-18 DIAGNOSIS — I482 Chronic atrial fibrillation: Secondary | ICD-10-CM | POA: Diagnosis not present

## 2018-04-18 DIAGNOSIS — I130 Hypertensive heart and chronic kidney disease with heart failure and stage 1 through stage 4 chronic kidney disease, or unspecified chronic kidney disease: Secondary | ICD-10-CM | POA: Diagnosis not present

## 2018-04-18 DIAGNOSIS — N183 Chronic kidney disease, stage 3 (moderate): Secondary | ICD-10-CM | POA: Diagnosis not present

## 2018-04-18 DIAGNOSIS — I429 Cardiomyopathy, unspecified: Secondary | ICD-10-CM | POA: Diagnosis not present

## 2018-04-18 DIAGNOSIS — E1122 Type 2 diabetes mellitus with diabetic chronic kidney disease: Secondary | ICD-10-CM | POA: Diagnosis not present

## 2018-04-18 DIAGNOSIS — I5043 Acute on chronic combined systolic (congestive) and diastolic (congestive) heart failure: Secondary | ICD-10-CM | POA: Diagnosis not present

## 2018-04-18 NOTE — Telephone Encounter (Signed)
OK 

## 2018-04-18 NOTE — Telephone Encounter (Signed)
[facility] is calling is asking for a verbal order on patient for P/T  1x a week for 1 week  2 x a week for 2 weeks  Please advise Phone# [phone]

## 2018-04-21 ENCOUNTER — Ambulatory Visit (INDEPENDENT_AMBULATORY_CARE_PROVIDER_SITE_OTHER): Payer: Medicare HMO

## 2018-04-21 ENCOUNTER — Telehealth: Payer: Self-pay

## 2018-04-21 DIAGNOSIS — I5042 Chronic combined systolic (congestive) and diastolic (congestive) heart failure: Secondary | ICD-10-CM

## 2018-04-21 DIAGNOSIS — Z95810 Presence of automatic (implantable) cardiac defibrillator: Secondary | ICD-10-CM

## 2018-04-21 NOTE — Telephone Encounter (Signed)
I again tried to call [facility], but VM box was full.

## 2018-04-21 NOTE — Telephone Encounter (Signed)
I tried to call the number provided, but the VM box was full. I will call back later.

## 2018-04-21 NOTE — Telephone Encounter (Signed)
LMOVM reminding daughter, DPR on file, to send remote transmission.

## 2018-04-21 NOTE — Progress Notes (Signed)
EPIC Encounter for ICM Monitoring  Patient Name: [name] is a 84 y.o. male Date: 04/21/2018 Primary Care Physican: Renato Shin, MD Primary Cardiologist: [name] Electrophysiologist: Lovena Le Dry Weight:  Previous weight 236 lbs  Bi-V Pacing:   90.3%          Attempted call to daughter Trinidad Curet, [place] and unable to reach.  Left message to return call regarding transmission.  Transmission reviewed.    Thoracic impedance abnormal suggesting fluid accumulation since 03/24/2018.  Prescribed dosage: Furosemide 40 mg Take 1 tablet once daily and take extra tablet as needed. (decreased on 04/10/2018 due to low BP ).  Potassium 20 mEq 2 tablet daily.   Labs: 04/10/2018 Creatinine 1.22, BUN 29, Potassium 4.4, Sodium 138, EGFR 3-61 04/01/2018 Creatinine 1.30, BUN 31, Potassium 4.3, Sodium 135, EGFR 48-55  03/20/2018 Creatinine 1.84, BUN 55, Potassium 4.5, Sodium 131  03/07/2018 Creatinine 1.69, BUN 63, Potassium 5.3, Sodium 133, EGFR 36-41  02/23/2018 Creatinine 1.39, BUN 48, Potassium 4.3, Sodium 134, EGFR 44-51  02/22/2018 Creatinine 1.24, BUN 37, Potassium 3.9, Sodium 135, EGFR 50-58  02/21/2018 Creatinine 1.29, BUN 35, Potassium 3.5, Sodium 137, EGFR 48-56  02/20/2018 Creatinine 1.08, BUN 28, Potassium 3.7, Sodium 137, EGFR 60->60  02/19/2018 Creatinine 1.05, BUN 33, Potassium 3.6, Sodium 137   02/18/2018 Creatinine 1.24, BUN 25, Potassium 4.4, Sodium 137, EGFR 50-58  02/17/2018 Creatinine 1.12, BUN 34, Potassium 4.9, Sodium 140 02/10/2018 Creatinine 1.30, BUN 45, Potassium 4.8, Sodium 139  01/27/2018 Creatinine 1.30, BUN 45, Potassium 4.8, Sodium 139  01/14/2018 Creatinine 1.85, BUN 80, Potassium 4.8, Sodium 138  12/25/2017 Creatinine 1.37, BUN 61, Potassium 4.9, Sodium 137  12/16/2017 Creatinine 1.36, BUN 55, Potassium 4.9, Sodium 134  A complete set of results can be found in Results Review.  Recommendations: NONE - Unable to reach.  Follow-up plan: ICM clinic phone  appointment on 04/28/2018.  Office appointment scheduled 05/15/2018 with Dr. Lovena Le.  Copy of ICM check sent to Dr. Lovena Le and Dr. [name] and Jory Sims NP.   3 month ICM trend: 04/21/2018    1 Year ICM trend:       Rosalene Billings, RN 04/21/2018 4:41 PM

## 2018-04-22 ENCOUNTER — Ambulatory Visit (INDEPENDENT_AMBULATORY_CARE_PROVIDER_SITE_OTHER): Payer: Medicare HMO | Admitting: Endocrinology

## 2018-04-22 ENCOUNTER — Encounter: Payer: Self-pay | Admitting: Endocrinology

## 2018-04-22 VITALS — BP 136/70 | HR 79 | Ht 68.5 in | Wt 236.0 lb

## 2018-04-22 DIAGNOSIS — E119 Type 2 diabetes mellitus without complications: Secondary | ICD-10-CM

## 2018-04-22 DIAGNOSIS — E1151 Type 2 diabetes mellitus with diabetic peripheral angiopathy without gangrene: Secondary | ICD-10-CM | POA: Diagnosis not present

## 2018-04-22 DIAGNOSIS — Z0000 Encounter for general adult medical examination without abnormal findings: Secondary | ICD-10-CM | POA: Diagnosis not present

## 2018-04-22 DIAGNOSIS — R413 Other amnesia: Secondary | ICD-10-CM

## 2018-04-22 DIAGNOSIS — I10 Essential (primary) hypertension: Secondary | ICD-10-CM | POA: Diagnosis not present

## 2018-04-22 LAB — POCT GLYCOSYLATED HEMOGLOBIN (HGB A1C): Hemoglobin A1C: 6.8

## 2018-04-22 MED ORDER — FUROSEMIDE 40 MG PO TABS: ORAL_TABLET | ORAL | 3 refills | Status: DC

## 2018-04-22 MED ORDER — DONEPEZIL HCL 5 MG PO TABS: 5.0000 mg | ORAL_TABLET | Freq: Every day | ORAL | 3 refills | Status: DC

## 2018-04-22 MED ORDER — DONEPEZIL HCL 5 MG PO TABS: 5.0000 mg | ORAL_TABLET | Freq: Every day | ORAL | 11 refills | Status: DC

## 2018-04-22 NOTE — Progress Notes (Signed)
we discussed code status.  pt requests full code, but would not want to be started or maintained on artificial life-support measures if there was not a reasonable chance of recovery 

## 2018-04-22 NOTE — Progress Notes (Signed)
Call to daughter.  Advised Joni Reining, NP ordered to take 60 mg Lasix daily. Also to call if he develops any dizziness or low BP and the agreed to call if any changes. She has plenty of Lasix on hand and will call if refill is needed.

## 2018-04-22 NOTE — Progress Notes (Signed)
Subjective:    Patient ID: [name], male    DOB: 08/26/1929, 84 y.o.   MRN: [id number]  HPI Pt returns for f/u of diabetes mellitus:  DM type: 2.  Dx'ed: 2008.  Complications: renal insufficiency. Therapy: no medication now.   DKA: never.  Severe hypoglycemia: never.   Pancreatitis: never.   Interval history: no change in chronic leg edema.  He takes meds as rx'ed Past Medical History:  Diagnosis Date  . Anemia   . Atrial fibrillation (HCC)   . BPH (benign prostatic hyperplasia)   . Bradycardia   . Cardiomyopathy   . CHF (congestive heart failure) (HCC)   . Diabetes mellitus type II   . GERD (gastroesophageal reflux disease)   . History of colonoscopy   . HTN (hypertension)   . Hyperlipidemia   . ICD (implantable cardiac defibrillator) in place 10/14/2012   biventricular  . OA (osteoarthritis)   . Obesity   . Peptic ulcer     Past Surgical History:  Procedure Laterality Date  . EP IMPLANTABLE DEVICE Left   . EP IMPLANTABLE DEVICE N/A 07/27/2016   Procedure: BIV Pacemaker downgrade;  Surgeon: Marinus Maw, MD;  Location: [facility] INVASIVE CV LAB;  Service: Cardiovascular;  Laterality: N/A;  . ESOPHAGOGASTRODUODENOSCOPY  06/24/2002  . L-spine  1965  . Lumbar L4-5 & S1  02/2000  . TOTAL KNEE ARTHROPLASTY  1997   right  . TOTAL KNEE ARTHROPLASTY Left 02/15/2015   Procedure: LEFT TOTAL KNEE ARTHROPLASTY;  Surgeon: Durene Romans, MD;  Location: [facility];  Service: Orthopedics;  Laterality: Left;    Social History   Socioeconomic History  . Marital status: Widowed    Spouse name: Not on file  . Number of children: Not on file  . Years of education: Not on file  . Highest education level: Not on file  Occupational History  . Occupation: Retired  Engineer, production  . Financial resource strain: Not on file  . Food insecurity:    Worry: Not on file    Inability: Not on file  . Transportation needs:    Medical: Not on file    Non-medical: Not on file  Tobacco Use  .  Smoking status: Never Smoker  . Smokeless tobacco: Never Used  Substance and Sexual Activity  . Alcohol use: No  . Drug use: No  . Sexual activity: Not on file  Lifestyle  . Physical activity:    Days per week: Not on file    Minutes per session: Not on file  . Stress: Not on file  Relationships  . Social connections:    Talks on phone: Not on file    Gets together: Not on file    Attends religious service: Not on file    Active member of club or organization: Not on file    Attends meetings of clubs or organizations: Not on file    Relationship status: Not on file  . Intimate partner violence:    Fear of current or ex partner: Not on file    Emotionally abused: Not on file    Physically abused: Not on file    Forced sexual activity: Not on file  Other Topics Concern  . Not on file  Social History Narrative  . Not on file    Current Outpatient Medications on File Prior to Visit  Medication Sig Dispense Refill  . acetaminophen (TYLENOL) 650 MG CR tablet Take 650-1,300 mg by mouth every 8 (eight) hours as needed  for pain.    [name] allopurinol (ZYLOPRIM) 100 MG tablet TAKE 1 TABLET EVERY DAY (Patient taking differently: TAKE 1 TABLET (100mg ) EVERY DAY) 90 tablet 1  . apixaban (ELIQUIS) 5 MG TABS tablet Take 1 tablet (5 mg total) by mouth 2 (two) times daily. 60 tablet 5  . atorvastatin (LIPITOR) 20 MG tablet TAKE 1 TABLET EVERY DAY (Patient taking differently: TAKE 1 TABLET (20mg ) EVERY DAY) 90 tablet 3  . cholecalciferol (VITAMIN D) 1000 units tablet Take 1,000 Units by mouth daily.    . magnesium oxide (MAG-OX) 400 (241.3 Mg) MG tablet Take 1 tablet (400 mg total) by mouth 2 (two) times daily. 90 tablet 3  . polyethylene glycol (MIRALAX / GLYCOLAX) packet Take 17 g by mouth daily as needed (constipation).    . potassium chloride SA (K-DUR,KLOR-CON) 20 MEQ tablet Take 20 mEq by mouth daily.    . tamsulosin (FLOMAX) 0.4 MG CAPS capsule TAKE 1 CAPSULE TWICE DAILY (Patient taking  differently: TAKE 1 CAPSULE (0.4mg ) TWICE DAILY) 180 capsule 2  . furosemide (LASIX) 40 MG tablet Take 1.5 tablets (60 mg total) every morning. 135 tablet 3   No current facility-administered medications on file prior to visit.     No Known Allergies  Family History  Problem Relation Age of Onset  . Heart disease Father   . Heart disease Mother   . Diabetes Mother   . Hypertension Brother   . Hypertension Brother   . Cancer Neg Hx     BP 136/70 (BP Location: Left Arm, Patient Position: Sitting, Cuff Size: Normal)   Pulse 79   Ht 5' 8.5" (1.74 m)   Wt 236 lb (107 kg)   SpO2 97%   BMI 35.36 kg/m    Review of Systems Dizziness is resolved.  He has slight memory loss.    Objective:   Physical Exam  VITAL SIGNS:  See vs page GENERAL: no distress.  Pulses: foot pulses are intact bilaterally.   MSK: no deformity of the feet or ankles.  CV: 2+ bilat edema of the legs, and bilat vv's Skin: severe bilat rust discoloration of the legs. Heavy calluses on the feet. no ulcer on the feet, but there is dry, scaly skin.  feet and legs are of normal temp, but are cyanotic.  Neuro: sensation is intact to touch on the feet and ankles.  Ext: There is bilateral onychomycosis of the toenails   Lab Results  Component Value Date   CREATININE 1.22 04/10/2018   BUN 29 (H) 04/10/2018   NA 138 04/10/2018   K 4.4 04/10/2018   CL 100 04/10/2018   CO2 24 04/10/2018   Lab Results  Component Value Date   HGBA1C 6.8 04/22/2018   Lab Results  Component Value Date   LABURIC 7.5 02/20/2018      Assessment & Plan:  Type 2 DM: well-controlled. We'll follow off medication.   Memory loss: he needs increased rx.  I rx'ed aricept. HTN: well-controlled.  Please continue the same medications.    Subjective:   Patient here for Medicare annual wellness visit and management of other chronic and acute problems.     Risk factors: advanced age    Roster of Physicians Providing Medical Care to  Patient:  See "snapshot"   Activities of Daily Living: In your present state of health, do you have any difficulty performing the following activities (lives alone, but dtr visits every day)?:  Preparing food and eating?: No  Bathing yourself: No  Getting dressed: No  Using the toilet:No  Moving around from place to place: No  In the past year have you fallen or had a near fall?: No    Home Safety: Has smoke detector and wears seat belts. Firearms are safely stored. No excess sun exposure.    Opioid Use: none   Diet and Exercise  Current exercise habits: pt says limited by health problems Dietary issues discussed: pt reports a healthy diet   Depression Screen  Q1: Over the past two weeks, have you felt down, depressed or hopeless? no  Q2: Over the past two weeks, have you felt little interest or pleasure in doing things? no   The following portions of the patient's history were reviewed and updated as appropriate: allergies, current medications, past family history, past medical history, past social history, past surgical history and problem list.   Review of Systems  No change in chronic hearing and visual losses Objective:   Vision:  Curator, so he declines VA today Hearing: grossly decreased from normal Body mass index:  See vs page Msk: pt slowly performs "get-up-and-go" from a sitting position.   Cognitive Impairment Assessment: cognition, memory and judgment appear normal.  remembers 3/3 at 5 minutes.  excellent recall.  can easily read and write a sentence.  alert and oriented, except says 04/14/19   Assessment:   Medicare wellness utd on preventive parameters    Plan:   During the course of the visit the patient was educated and counseled about appropriate screening and preventive services including:        Fall prevention is advised today  Diabetes screening is UTD Nutrition counseling is offered  advanced directives/end of life addressed today:  see  healthcare directives hyperlink  Vaccines are updated as needed  Patient Instructions (the written plan) was given to the patient.

## 2018-04-22 NOTE — Addendum Note (Signed)
Addended by: Karie Soda on: 04/22/2018 03:38 PM   Modules accepted: Orders

## 2018-04-22 NOTE — Progress Notes (Signed)
Ok to continue additional lasix as outlined. Follow for orthostatic symptoms and BP [name]

## 2018-04-22 NOTE — Progress Notes (Signed)
Message received.  Message  Received: Today  Message Contents  Jodelle Gross, NP  Joley Utecht, Josephine Igo, RN        Would increase lasix to 60 mg daily due to readings suggestive of continued fluid accumulation. Avoid salted foods please.

## 2018-04-22 NOTE — Progress Notes (Signed)
Received returned call from daughter, [name]. Patient's current weight  231.8 lbs and BP 110/68 (taken a few days ago).  Patient has ankle swelling that has not improved after taking 1 extra Furosemide 40 mg for past 3 days.  The additional Furosemide increases his urination but not improving symptoms.  She asked if he should continue to take the extra Furosemide until the ankle swelling resolves.   Advised I would ask for recommendation from Joni Reining since he had a visit with her last week or Dr Jens Som for further recommendations if patient should continue to take extra 40 mg of Furosemide until ankle swelling resolves.  Repeat fluid level check on 04/28/2018.

## 2018-04-22 NOTE — Patient Instructions (Signed)
Please consider these measures for your health:  minimize alcohol.  Do not use tobacco products.  Have a colonoscopy at least every 10 years from age 82.  Women should have an annual mammogram from age 13.  Keep firearms safely stored.  Always use seat belts.  have working smoke alarms in your home.  See an eye doctor and dentist regularly.  Never drive under the influence of alcohol or drugs (including prescription drugs).  Those with fair skin should take precautions against the sun, and should carefully examine their skin once per month, for any new or changed moles. It is critically important to prevent falling down (keep floor areas well-lit, dry, and free of loose objects.  If you have a cane, walker, or wheelchair, you should use it, even for short trips around the house.  Wear flat-soled shoes.  Also, try not to rush). I have sent a prescription to your pharmacy, for the memory. Please come back for a follow-up appointment in 2 months.

## 2018-04-22 NOTE — Telephone Encounter (Signed)
I called [phone] which is number I was given by patient's daughter for PT. I left message that I was calling to give verbal orders. I asked that they call back with any questions. I was also was told that PT's name is [name] & that is the number I called.

## 2018-04-23 ENCOUNTER — Encounter: Payer: Self-pay | Admitting: Podiatry

## 2018-04-23 ENCOUNTER — Ambulatory Visit: Payer: Medicare HMO | Admitting: Podiatry

## 2018-04-23 DIAGNOSIS — N183 Chronic kidney disease, stage 3 (moderate): Secondary | ICD-10-CM | POA: Diagnosis not present

## 2018-04-23 DIAGNOSIS — I429 Cardiomyopathy, unspecified: Secondary | ICD-10-CM | POA: Diagnosis not present

## 2018-04-23 DIAGNOSIS — M79674 Pain in right toe(s): Secondary | ICD-10-CM

## 2018-04-23 DIAGNOSIS — E119 Type 2 diabetes mellitus without complications: Secondary | ICD-10-CM | POA: Diagnosis not present

## 2018-04-23 DIAGNOSIS — B351 Tinea unguium: Secondary | ICD-10-CM

## 2018-04-23 DIAGNOSIS — M79675 Pain in left toe(s): Secondary | ICD-10-CM | POA: Diagnosis not present

## 2018-04-23 DIAGNOSIS — I130 Hypertensive heart and chronic kidney disease with heart failure and stage 1 through stage 4 chronic kidney disease, or unspecified chronic kidney disease: Secondary | ICD-10-CM | POA: Diagnosis not present

## 2018-04-23 DIAGNOSIS — I482 Chronic atrial fibrillation: Secondary | ICD-10-CM | POA: Diagnosis not present

## 2018-04-23 DIAGNOSIS — E1122 Type 2 diabetes mellitus with diabetic chronic kidney disease: Secondary | ICD-10-CM | POA: Diagnosis not present

## 2018-04-23 DIAGNOSIS — D689 Coagulation defect, unspecified: Secondary | ICD-10-CM

## 2018-04-23 DIAGNOSIS — I5043 Acute on chronic combined systolic (congestive) and diastolic (congestive) heart failure: Secondary | ICD-10-CM | POA: Diagnosis not present

## 2018-04-23 LAB — MICROALBUMIN / CREATININE URINE RATIO
CREATININE, U: 122.5 mg/dL
MICROALB/CREAT RATIO: 1.5 mg/g (ref 0.0–30.0)
Microalb, Ur: 1.9 mg/dL (ref 0.0–1.9)

## 2018-04-23 NOTE — Progress Notes (Signed)
Complaint:  Visit Type: Patient returns to my office for continued preventative foot care services. Complaint: Patient states" my nails have grown long and thick and become painful to walk and wear shoes" Patient has been diagnosed with DM with no foot complications. The patient presents for preventative foot care services. No changes to ROS.  Patient is taking coumadin.  Podiatric Exam: Vascular: dorsalis pedis and posterior tibial pulses are palpable bilateral. Capillary return is immediate. Temperature gradient is WNL. Skin turgor WNL  Sensorium: Normal Semmes Weinstein monofilament test. Normal tactile sensation bilaterally. Nail Exam: Pt has thick disfigured discolored nails with subungual debris noted bilateral entire nail hallux through fifth toenails Ulcer Exam: There is no evidence of ulcer or pre-ulcerative changes or infection. Orthopedic Exam: Muscle tone and strength are WNL. No limitations in general ROM. No crepitus or effusions noted. Foot type and digits show no abnormalities. Bony prominences are unremarkable. Skin: Asymptomatic  Porokeratosis  Sub 1 sub heel right foot.. No infection or ulcers  Diagnosis:  Onychomycosis, , Pain in right toe, pain in left toes  Treatment & Plan Procedures and Treatment: Consent by patient was obtained for treatment procedures.   Debridement of mycotic and hypertrophic toenails, 1 through 5 bilateral and clearing of subungual debris. No ulceration, no infection noted.  Return Visit-Office Procedure: Patient instructed to return to the office for a follow up visit 3 months for continued evaluation and treatment.    Santi Troung DPM 

## 2018-04-24 DIAGNOSIS — N183 Chronic kidney disease, stage 3 (moderate): Secondary | ICD-10-CM | POA: Diagnosis not present

## 2018-04-24 DIAGNOSIS — I482 Chronic atrial fibrillation: Secondary | ICD-10-CM | POA: Diagnosis not present

## 2018-04-24 DIAGNOSIS — I429 Cardiomyopathy, unspecified: Secondary | ICD-10-CM | POA: Diagnosis not present

## 2018-04-24 DIAGNOSIS — I5043 Acute on chronic combined systolic (congestive) and diastolic (congestive) heart failure: Secondary | ICD-10-CM | POA: Diagnosis not present

## 2018-04-24 DIAGNOSIS — I130 Hypertensive heart and chronic kidney disease with heart failure and stage 1 through stage 4 chronic kidney disease, or unspecified chronic kidney disease: Secondary | ICD-10-CM | POA: Diagnosis not present

## 2018-04-24 DIAGNOSIS — E1122 Type 2 diabetes mellitus with diabetic chronic kidney disease: Secondary | ICD-10-CM | POA: Diagnosis not present

## 2018-04-25 DIAGNOSIS — I5043 Acute on chronic combined systolic (congestive) and diastolic (congestive) heart failure: Secondary | ICD-10-CM | POA: Diagnosis not present

## 2018-04-25 DIAGNOSIS — N183 Chronic kidney disease, stage 3 (moderate): Secondary | ICD-10-CM | POA: Diagnosis not present

## 2018-04-25 DIAGNOSIS — I429 Cardiomyopathy, unspecified: Secondary | ICD-10-CM | POA: Diagnosis not present

## 2018-04-25 DIAGNOSIS — I482 Chronic atrial fibrillation: Secondary | ICD-10-CM | POA: Diagnosis not present

## 2018-04-25 DIAGNOSIS — I130 Hypertensive heart and chronic kidney disease with heart failure and stage 1 through stage 4 chronic kidney disease, or unspecified chronic kidney disease: Secondary | ICD-10-CM | POA: Diagnosis not present

## 2018-04-25 DIAGNOSIS — E1122 Type 2 diabetes mellitus with diabetic chronic kidney disease: Secondary | ICD-10-CM | POA: Diagnosis not present

## 2018-04-28 ENCOUNTER — Ambulatory Visit (INDEPENDENT_AMBULATORY_CARE_PROVIDER_SITE_OTHER): Payer: Self-pay

## 2018-04-28 ENCOUNTER — Telehealth: Payer: Self-pay | Admitting: Cardiology

## 2018-04-28 ENCOUNTER — Telehealth: Payer: Self-pay | Admitting: Endocrinology

## 2018-04-28 DIAGNOSIS — I482 Chronic atrial fibrillation: Secondary | ICD-10-CM | POA: Diagnosis not present

## 2018-04-28 DIAGNOSIS — I5042 Chronic combined systolic (congestive) and diastolic (congestive) heart failure: Secondary | ICD-10-CM

## 2018-04-28 DIAGNOSIS — E1122 Type 2 diabetes mellitus with diabetic chronic kidney disease: Secondary | ICD-10-CM | POA: Diagnosis not present

## 2018-04-28 DIAGNOSIS — I5043 Acute on chronic combined systolic (congestive) and diastolic (congestive) heart failure: Secondary | ICD-10-CM | POA: Diagnosis not present

## 2018-04-28 DIAGNOSIS — I429 Cardiomyopathy, unspecified: Secondary | ICD-10-CM | POA: Diagnosis not present

## 2018-04-28 DIAGNOSIS — N183 Chronic kidney disease, stage 3 (moderate): Secondary | ICD-10-CM | POA: Diagnosis not present

## 2018-04-28 DIAGNOSIS — I130 Hypertensive heart and chronic kidney disease with heart failure and stage 1 through stage 4 chronic kidney disease, or unspecified chronic kidney disease: Secondary | ICD-10-CM | POA: Diagnosis not present

## 2018-04-28 DIAGNOSIS — Z95810 Presence of automatic (implantable) cardiac defibrillator: Secondary | ICD-10-CM

## 2018-04-28 NOTE — Telephone Encounter (Signed)
OK 

## 2018-04-28 NOTE — Telephone Encounter (Signed)
Confirmed remote transmission w/ pt daughter.   

## 2018-04-28 NOTE — Telephone Encounter (Signed)
LVM with Koleen Nimrod stating that I was calling to give verbal orders. I ask that if she needed anything further she call me back.

## 2018-04-28 NOTE — Telephone Encounter (Signed)
[name] from [facility], need P/T for home health care need 2 x a week for 4 weeks, and a 3 in one bed side commode. Fax # [phone] Phone # [phone]

## 2018-04-29 ENCOUNTER — Telehealth: Payer: Self-pay

## 2018-04-29 DIAGNOSIS — Z79899 Other long term (current) drug therapy: Secondary | ICD-10-CM | POA: Diagnosis not present

## 2018-04-29 NOTE — Telephone Encounter (Signed)
Remote ICM transmission received.  Attempted call to daughter and left message to return call. 

## 2018-04-29 NOTE — Progress Notes (Signed)
EPIC Encounter for ICM Monitoring  Patient Name: [name] is a 84 y.o. male Date: 04/29/2018 Primary Care Physican: Renato Shin, MD Primary Cardiologist:Crenshaw Electrophysiologist:Taylor Dry Weight:Previous weight 236lbs   Clinical Status (21-Apr-2018 to 28-Apr-2018) Bi-V Pacing:75.4% for past 7 days (was 90.3%from 20-Mar-2018 to 21-Apr-2018)       Attempted call to daughter Trinidad Curet, [place] and unable to reach.  Left message to return call.  Transmission reviewed.    Thoracic impedance continues to be abnormal suggesting fluid accumulation even after increase in Furosemide maintenance dose to 60 mg daily.   Prescribed dosage: Furosemide 60 mg Take 1 tablet once daily. Potassium 20 mEq 2 tablet daily.   Labs: 04/10/2018 Creatinine 1.22, BUN 29, Potassium 4.4, Sodium 138, EGFR 3-61 04/01/2018 Creatinine 1.30, BUN 31, Potassium 4.3, Sodium 135, EGFR 48-55  03/20/2018 Creatinine 1.84, BUN 55, Potassium 4.5, Sodium 131  03/07/2018 Creatinine 1.69, BUN 63, Potassium 5.3, Sodium 133, EGFR 36-41  02/23/2018 Creatinine 1.39, BUN 48, Potassium 4.3, Sodium 134, EGFR 44-51  02/22/2018 Creatinine 1.24, BUN 37, Potassium 3.9, Sodium 135, EGFR 50-58  02/21/2018 Creatinine 1.29, BUN 35, Potassium 3.5, Sodium 137, EGFR 48-56  02/20/2018 Creatinine 1.08, BUN 28, Potassium 3.7, Sodium 137, EGFR 60->60  02/19/2018 Creatinine 1.05, BUN 33, Potassium 3.6, Sodium 137   02/18/2018 Creatinine 1.24, BUN 25, Potassium 4.4, Sodium 137, EGFR 50-58  02/17/2018 Creatinine 1.12, BUN 34, Potassium 4.9, Sodium 140 02/10/2018 Creatinine 1.30, BUN 45, Potassium 4.8, Sodium 139  01/27/2018 Creatinine 1.30, BUN 45, Potassium 4.8, Sodium 139  01/14/2018 Creatinine 1.85, BUN 80, Potassium 4.8, Sodium 138  12/25/2017 Creatinine 1.37, BUN 61, Potassium 4.9, Sodium 137  12/16/2017 Creatinine 1.36, BUN 55, Potassium 4.9, Sodium 134  A complete set of results can be found in Results  Review.  Recommendations: NONE - Unable to reach.  Follow-up plan: ICM clinic phone appointment on 05/08/2018.  Office appointment scheduled 05/15/2018 with Dr. Lovena Le.  Copy of ICM check sent to Dr. Lovena Le, Jory Sims, NP, and Dr. [name].   3 month ICM trend: 04/28/2018    1 Year ICM trend:       Rosalene Billings, RN 04/29/2018 8:58 AM

## 2018-04-30 LAB — BASIC METABOLIC PANEL
BUN / CREAT RATIO: 16 (ref 10–24)
BUN: 18 mg/dL (ref 8–27)
CO2: 24 mmol/L (ref 20–29)
CREATININE: 1.1 mg/dL (ref 0.76–1.27)
Calcium: 9.3 mg/dL (ref 8.6–10.2)
Chloride: 98 mmol/L (ref 96–106)
GFR calc non Af Amer: 60 mL/min/{1.73_m2} (ref >59)
GFR, EST AFRICAN AMERICAN: 69 mL/min/{1.73_m2} (ref >59)
GLUCOSE: 132 mg/dL — AB (ref 65–99)
Potassium: 3.7 mmol/L (ref 3.5–5.2)
SODIUM: 139 mmol/L (ref 134–144)

## 2018-05-01 DIAGNOSIS — I130 Hypertensive heart and chronic kidney disease with heart failure and stage 1 through stage 4 chronic kidney disease, or unspecified chronic kidney disease: Secondary | ICD-10-CM | POA: Diagnosis not present

## 2018-05-01 DIAGNOSIS — N183 Chronic kidney disease, stage 3 (moderate): Secondary | ICD-10-CM | POA: Diagnosis not present

## 2018-05-01 DIAGNOSIS — I5043 Acute on chronic combined systolic (congestive) and diastolic (congestive) heart failure: Secondary | ICD-10-CM | POA: Diagnosis not present

## 2018-05-01 DIAGNOSIS — I482 Chronic atrial fibrillation: Secondary | ICD-10-CM | POA: Diagnosis not present

## 2018-05-01 DIAGNOSIS — E1122 Type 2 diabetes mellitus with diabetic chronic kidney disease: Secondary | ICD-10-CM | POA: Diagnosis not present

## 2018-05-01 DIAGNOSIS — I429 Cardiomyopathy, unspecified: Secondary | ICD-10-CM | POA: Diagnosis not present

## 2018-05-02 NOTE — Progress Notes (Signed)
Spoke with daughter.  She stated patient is feeling much better.  Has not had any dizziness, weight is down 230 lbs and no further swelling in ankles or legs. Will mail DPR for signature to leave messages on daughters cell phone.

## 2018-05-06 DIAGNOSIS — I130 Hypertensive heart and chronic kidney disease with heart failure and stage 1 through stage 4 chronic kidney disease, or unspecified chronic kidney disease: Secondary | ICD-10-CM | POA: Diagnosis not present

## 2018-05-06 DIAGNOSIS — I429 Cardiomyopathy, unspecified: Secondary | ICD-10-CM | POA: Diagnosis not present

## 2018-05-06 DIAGNOSIS — I482 Chronic atrial fibrillation: Secondary | ICD-10-CM | POA: Diagnosis not present

## 2018-05-06 DIAGNOSIS — I5043 Acute on chronic combined systolic (congestive) and diastolic (congestive) heart failure: Secondary | ICD-10-CM | POA: Diagnosis not present

## 2018-05-06 DIAGNOSIS — N183 Chronic kidney disease, stage 3 (moderate): Secondary | ICD-10-CM | POA: Diagnosis not present

## 2018-05-06 DIAGNOSIS — E1122 Type 2 diabetes mellitus with diabetic chronic kidney disease: Secondary | ICD-10-CM | POA: Diagnosis not present

## 2018-05-07 ENCOUNTER — Encounter: Payer: Medicare HMO | Admitting: Internal Medicine

## 2018-05-08 ENCOUNTER — Ambulatory Visit (INDEPENDENT_AMBULATORY_CARE_PROVIDER_SITE_OTHER): Payer: Medicare HMO

## 2018-05-08 DIAGNOSIS — I5043 Acute on chronic combined systolic (congestive) and diastolic (congestive) heart failure: Secondary | ICD-10-CM | POA: Diagnosis not present

## 2018-05-08 DIAGNOSIS — I429 Cardiomyopathy, unspecified: Secondary | ICD-10-CM | POA: Diagnosis not present

## 2018-05-08 DIAGNOSIS — Z95810 Presence of automatic (implantable) cardiac defibrillator: Secondary | ICD-10-CM

## 2018-05-08 DIAGNOSIS — I482 Chronic atrial fibrillation: Secondary | ICD-10-CM | POA: Diagnosis not present

## 2018-05-08 DIAGNOSIS — I5042 Chronic combined systolic (congestive) and diastolic (congestive) heart failure: Secondary | ICD-10-CM

## 2018-05-08 DIAGNOSIS — I130 Hypertensive heart and chronic kidney disease with heart failure and stage 1 through stage 4 chronic kidney disease, or unspecified chronic kidney disease: Secondary | ICD-10-CM | POA: Diagnosis not present

## 2018-05-08 DIAGNOSIS — N183 Chronic kidney disease, stage 3 (moderate): Secondary | ICD-10-CM | POA: Diagnosis not present

## 2018-05-08 DIAGNOSIS — E1122 Type 2 diabetes mellitus with diabetic chronic kidney disease: Secondary | ICD-10-CM | POA: Diagnosis not present

## 2018-05-08 NOTE — Progress Notes (Signed)
EPIC Encounter for ICM Monitoring  Patient Name: [name] is a 84 y.o. male Date: 05/08/2018 Primary Care Physican: Renato Shin, MD Primary Cardiologist:Crenshaw Electrophysiologist:Taylor Dry Weight:Previous weight230lbs   Clinical Status (21-Apr-2018 to 28-Apr-2018) Bi-V Pacing:75.4% for past 7 days (was 90.3%from 20-Mar-2018 to 21-Apr-2018)                                                                        Attempted call to daughter Trinidad Curet, [place] and unable to reach.  Transmission reached.   Thoracic impedance continues to be abnormal suggesting fluid accumulation since 03/24/2018.   Prescribed dosage: Furosemide60 mgTake 1 tablet once daily. Potassium 20 mEq 2 tablet daily.  Labs: 04/10/2018 Creatinine1.22, BUN29, Potassium4.4, [id number], EGFR3-61 04/01/2018 Creatinine1.30, BUN31, Potassium4.3, Sodium135, GFQM21-03  03/20/2018 Creatinine1.84, BUN55, Potassium4.5, Sodium131  03/07/2018 Creatinine1.69, BUN63, Potassium5.3, [id number], [id number]  02/23/2018 Creatinine1.39, BUN48, Potassium4.3, [id number], HIDU37-35  02/22/2018 Creatinine1.24, BUN37, Potassium3.9, Sodium135, EGFR50-58  02/21/2018 Creatinine1.29, BUN35, Potassium3.5, DIXBOE784, XQKS08-13  02/20/2018 Creatinine1.08, BUN28, Potassium3.7, GITJLL974, EGFR60->60  02/19/2018 Creatinine1.05, BUN33, Potassium3.6, [id number]  02/18/2018 Creatinine1.24, BUN25, Potassium4.4, EWYBRK935, LEZV47-15  02/17/2018 Creatinine1.12, BUN34, Potassium4.9, Sodium140 02/10/2018 Creatinine1.30, BUN45, Potassium4.8, [id number]  01/27/2018 Creatinine1.30, BUN45, Potassium4.8, Sodium139  01/14/2018 Creatinine1.85, BUN80, Potassium4.8, Sodium 138  12/25/2017 Creatinine1.37, BUN61, Potassium4.9, [id number]  12/16/2017 Creatinine1.36, BUN55, Potassium4.9, Sodium134 A complete set of results can be found in Results Review.  Recommendations:  NONE - Unable to reach.  Follow-up plan: ICM clinic phone appointment on 05/28/2018.  Office appointment scheduled 05/15/2018 with Dr. Lovena Le.  Copy of ICM check sent to Dr. Lovena Le and Dr. [name] for review and recommendations if needed.   3 month ICM trend: 05/08/2018    1 Year ICM trend:       Rosalene Billings, RN 05/08/2018 5:03 PM

## 2018-05-09 ENCOUNTER — Telehealth: Payer: Self-pay

## 2018-05-09 NOTE — Telephone Encounter (Signed)
Remote ICM transmission received.  Attempted call to daughter Precious Haws and left message to return call.

## 2018-05-09 NOTE — Progress Notes (Signed)
Spoke with daughter, Bradley Ferris. She said his ankles continue to be swollen and weight 230-231 lbs.  He continues with in home physical therapy.  Next ICM remote transmission scheduled 05/28/2018. Discussed limiting fluid and salt intake.

## 2018-05-13 ENCOUNTER — Telehealth: Payer: Self-pay | Admitting: Endocrinology

## 2018-05-13 DIAGNOSIS — I5043 Acute on chronic combined systolic (congestive) and diastolic (congestive) heart failure: Secondary | ICD-10-CM | POA: Diagnosis not present

## 2018-05-13 DIAGNOSIS — I429 Cardiomyopathy, unspecified: Secondary | ICD-10-CM | POA: Diagnosis not present

## 2018-05-13 DIAGNOSIS — I482 Chronic atrial fibrillation: Secondary | ICD-10-CM | POA: Diagnosis not present

## 2018-05-13 DIAGNOSIS — I130 Hypertensive heart and chronic kidney disease with heart failure and stage 1 through stage 4 chronic kidney disease, or unspecified chronic kidney disease: Secondary | ICD-10-CM | POA: Diagnosis not present

## 2018-05-13 DIAGNOSIS — E1122 Type 2 diabetes mellitus with diabetic chronic kidney disease: Secondary | ICD-10-CM | POA: Diagnosis not present

## 2018-05-13 DIAGNOSIS — N183 Chronic kidney disease, stage 3 (moderate): Secondary | ICD-10-CM | POA: Diagnosis not present

## 2018-05-13 NOTE — Telephone Encounter (Signed)
Patients daughter stated that the [facility] pharmacy will not refill this prescription until they hear back from our office. She stated that the pharmacy faxed over a paperwork to our office.     donepezil (ARICEPT) 5 MG tablet

## 2018-05-14 NOTE — Telephone Encounter (Signed)
I have called [employer] & did PA. There was a drug contradiction with pacerone which patient no longer takes. They said they would notify us via fax if medication was approved. Reference #[id number]. I have also notified patient's daughter of this.

## 2018-05-15 ENCOUNTER — Encounter: Payer: Self-pay | Admitting: Internal Medicine

## 2018-05-15 ENCOUNTER — Ambulatory Visit: Payer: Medicare HMO | Admitting: Internal Medicine

## 2018-05-15 ENCOUNTER — Telehealth: Payer: Self-pay

## 2018-05-15 VITALS — BP 126/70 | HR 83 | Ht 68.5 in | Wt 234.0 lb

## 2018-05-15 DIAGNOSIS — Z950 Presence of cardiac pacemaker: Secondary | ICD-10-CM | POA: Diagnosis not present

## 2018-05-15 DIAGNOSIS — I5022 Chronic systolic (congestive) heart failure: Secondary | ICD-10-CM | POA: Diagnosis not present

## 2018-05-15 DIAGNOSIS — I480 Paroxysmal atrial fibrillation: Secondary | ICD-10-CM

## 2018-05-15 DIAGNOSIS — R001 Bradycardia, unspecified: Secondary | ICD-10-CM | POA: Diagnosis not present

## 2018-05-15 LAB — CUP PACEART INCLINIC DEVICE CHECK
Battery Remaining Longevity: 53 mo
Brady Statistic AP VS Percent: 0 %
Brady Statistic AS VP Percent: 0 %
Brady Statistic AS VS Percent: 0 %
Brady Statistic RA Percent Paced: 0 %
Brady Statistic RV Percent Paced: 84.18 %
Date Time Interrogation Session: 20190606151043
Implantable Lead Implant Date: 20131105
Implantable Lead Location: 753858
Implantable Pulse Generator Implant Date: 20170818
Lead Channel Impedance Value: 342 Ohm
Lead Channel Impedance Value: 342 Ohm
Lead Channel Impedance Value: 399 Ohm
Lead Channel Impedance Value: 437 Ohm
Lead Channel Impedance Value: 456 Ohm
Lead Channel Impedance Value: 589 Ohm
Lead Channel Pacing Threshold Amplitude: 0.5 V
Lead Channel Pacing Threshold Pulse Width: 1.5 ms
Lead Channel Sensing Intrinsic Amplitude: 10.125 mV
Lead Channel Sensing Intrinsic Amplitude: 12.125 mV
Lead Channel Setting Pacing Amplitude: 2 V
Lead Channel Setting Pacing Amplitude: 2 V
Lead Channel Setting Pacing Pulse Width: 0.4 ms
Lead Channel Setting Sensing Sensitivity: 0.9 mV
MDC IDC LEAD IMPLANT DT: 20131105
MDC IDC LEAD IMPLANT DT: 20131105
MDC IDC LEAD LOCATION: 753859
MDC IDC LEAD LOCATION: 753860
MDC IDC MSMT BATTERY VOLTAGE: 3 V
MDC IDC MSMT LEADCHNL LV IMPEDANCE VALUE: 399 Ohm
MDC IDC MSMT LEADCHNL LV PACING THRESHOLD AMPLITUDE: 1 V
MDC IDC MSMT LEADCHNL RA IMPEDANCE VALUE: 323 Ohm
MDC IDC MSMT LEADCHNL RV IMPEDANCE VALUE: 304 Ohm
MDC IDC MSMT LEADCHNL RV PACING THRESHOLD PULSEWIDTH: 0.4 ms
MDC IDC SET LEADCHNL LV PACING PULSEWIDTH: 1.5 ms
MDC IDC STAT BRADY AP VP PERCENT: 0 %

## 2018-05-15 NOTE — Progress Notes (Signed)
HPI Mr. Voeltz returns today for evaluation of ventricular ectopy. He is a pleasant 84 yo man with chronic systolic heart failure, renal insufficiency, class 3, HTN, and chroic atrial fib. He has not had any bleeding since switching from warfarin to Eliquis. His weight has not gone back up since being discharged from the hospital several months ago according to his daughter. He has a h/o dietary indiscretion with sodium. He does not feel palpitations. He is s/p Biv PPM in the past. The patient has had no additional syncope.  No Known Allergies   Current Outpatient Medications  Medication Sig Dispense Refill  . acetaminophen (TYLENOL) 650 MG CR tablet Take 650-1,300 mg by mouth every 8 (eight) hours as needed for pain.    [name] allopurinol (ZYLOPRIM) 100 MG tablet TAKE 1 TABLET EVERY DAY (Patient taking differently: TAKE 1 TABLET (100mg ) EVERY DAY) 90 tablet 1  . apixaban (ELIQUIS) 5 MG TABS tablet Take 1 tablet (5 mg total) by mouth 2 (two) times daily. 60 tablet 5  . atorvastatin (LIPITOR) 20 MG tablet TAKE 1 TABLET EVERY DAY (Patient taking differently: TAKE 1 TABLET (20mg ) EVERY DAY) 90 tablet 3  . cholecalciferol (VITAMIN D) 1000 units tablet Take 1,000 Units by mouth daily.    [name] donepezil (ARICEPT) 5 MG tablet Take 1 tablet (5 mg total) by mouth at bedtime. 90 tablet 3  . furosemide (LASIX) 40 MG tablet Take 1.5 tablets (60 mg total) every morning. 135 tablet 3  . magnesium oxide (MAG-OX) 400 (241.3 Mg) MG tablet Take 1 tablet (400 mg total) by mouth 2 (two) times daily. 90 tablet 3  . polyethylene glycol (MIRALAX / GLYCOLAX) packet Take 17 g by mouth daily as needed (constipation).    . potassium chloride SA (K-DUR,KLOR-CON) 20 MEQ tablet Take 20 mEq by mouth daily.    . tamsulosin (FLOMAX) 0.4 MG CAPS capsule TAKE 1 CAPSULE TWICE DAILY (Patient taking differently: TAKE 1 CAPSULE (0.4mg ) TWICE DAILY) 180 capsule 2   No current facility-administered medications for this visit.       Past Medical History:  Diagnosis Date  . Anemia   . Atrial fibrillation (HCC)   . BPH (benign prostatic hyperplasia)   . Bradycardia   . Cardiomyopathy   . CHF (congestive heart failure) (HCC)   . Diabetes mellitus type II   . GERD (gastroesophageal reflux disease)   . History of colonoscopy   . HTN (hypertension)   . Hyperlipidemia   . ICD (implantable cardiac defibrillator) in place 10/14/2012   biventricular  . OA (osteoarthritis)   . Obesity   . Peptic ulcer     ROS:   All systems reviewed and negative except as noted in the HPI.   Past Surgical History:  Procedure Laterality Date  . EP IMPLANTABLE DEVICE Left   . EP IMPLANTABLE DEVICE N/A 07/27/2016   Procedure: BIV Pacemaker downgrade;  Surgeon: Marinus Maw, MD;  Location: [facility] INVASIVE CV LAB;  Service: Cardiovascular;  Laterality: N/A;  . ESOPHAGOGASTRODUODENOSCOPY  06/24/2002  . L-spine  1965  . Lumbar L4-5 & S1  02/2000  . TOTAL KNEE ARTHROPLASTY  1997   right  . TOTAL KNEE ARTHROPLASTY Left 02/15/2015   Procedure: LEFT TOTAL KNEE ARTHROPLASTY;  Surgeon: Durene Romans, MD;  Location: [facility] ORS;  Service: Orthopedics;  Laterality: Left;     Family History  Problem Relation Age of Onset  . Heart disease Father   . Heart disease Mother   . Diabetes  Mother   . Hypertension Brother   . Hypertension Brother   . Cancer Neg Hx      Social History   Socioeconomic History  . Marital status: Widowed    Spouse name: Not on file  . Number of children: Not on file  . Years of education: Not on file  . Highest education level: Not on file  Occupational History  . Occupation: Retired  Engineer, production  . Financial resource strain: Not on file  . Food insecurity:    Worry: Not on file    Inability: Not on file  . Transportation needs:    Medical: Not on file    Non-medical: Not on file  Tobacco Use  . Smoking status: Never Smoker  . Smokeless tobacco: Never Used  Substance and Sexual Activity  . Alcohol  use: No  . Drug use: No  . Sexual activity: Not on file  Lifestyle  . Physical activity:    Days per week: Not on file    Minutes per session: Not on file  . Stress: Not on file  Relationships  . Social connections:    Talks on phone: Not on file    Gets together: Not on file    Attends religious service: Not on file    Active member of club or organization: Not on file    Attends meetings of clubs or organizations: Not on file    Relationship status: Not on file  . Intimate partner violence:    Fear of current or ex partner: Not on file    Emotionally abused: Not on file    Physically abused: Not on file    Forced sexual activity: Not on file  Other Topics Concern  . Not on file  Social History Narrative  . Not on file     BP 126/70   Pulse 83   Ht 5' 8.5" (1.74 m)   Wt 234 lb (106.1 kg)   BMI 35.06 kg/m   Physical Exam:  Chronically ill appearing elderly man, NAD HEENT: Unremarkable Neck:  6 cm JVD, no thyromegally Lymphatics:  No adenopathy Back:  No CVA tenderness Lungs:  Clear with no wheezes HEART: I Regular rate rhythm, no murmurs, no rubs, no clicks Abd:  soft, positive bowel sounds, no organomegally, no rebound, no guarding Ext:  2 plus pulses, 2+ edema, no cyanosis, no clubbing Skin:  No rashes no nodules Neuro:  CN II through XII intact, motor grossly intact  EKG - atrial fib with ventricular pacing and PVC's in a bigeminal fashion.  DEVICE  Normal device function.  See PaceArt for details.   Assess/Plan: 1. Atrial fib - his ventricular rate is well controlled. He will continue his current meds. 2. PVC's - the patient is asymptomatic. I would like to make these go away. I tried over drive pacing at 16/XWR but he continued to have ventricular ectopy. I do not think amiodarone is warranted at this time. 3. Chronic systolic heart failure - his fluid index is elevated. His weight remains down although he has peripheral edema. I suspect he will need more  lasix or torsemide but will defer titration of these meds to Dr. Marsa Aris.  4. BiV PPM - his medtronic biv PPM is working normally. Will recheck in several months.  Leonia Reeves.D.

## 2018-05-15 NOTE — Patient Instructions (Addendum)
Medication Instructions:  Your physician recommends that you continue on your current medications as directed. Please refer to the Current Medication list given to you today.  Labwork: You will get lab work today:  BMP and CBC  Testing/Procedures: None ordered.  Follow-Up: Your physician wants you to follow-up in: 6 months with Dr. Ladona Ridgel.   You will receive a reminder letter in the mail two months in advance. If you don't receive a letter, please call our office to schedule the follow-up appointment.  Remote monitoring is used to monitor your Pacemaker from home. This monitoring reduces the number of office visits required to check your device to one time per year. It allows [place] to keep an eye on the functioning of your device to ensure it is working properly. You are scheduled for a device check from home on 05/28/2018. You may send your transmission at any time that day. If you have a wireless device, the transmission will be sent automatically. After your physician reviews your transmission, you will receive a postcard with your next transmission date.  Any Other Special Instructions Will Be Listed Below (If Applicable).  If you need a refill on your cardiac medications before your next appointment, please call your pharmacy.

## 2018-05-15 NOTE — Telephone Encounter (Signed)
[employer] approved donepezil HCL 5 mg tab authorization good until 05/15/19

## 2018-05-16 ENCOUNTER — Telehealth: Payer: Self-pay | Admitting: Internal Medicine

## 2018-05-16 DIAGNOSIS — I5043 Acute on chronic combined systolic (congestive) and diastolic (congestive) heart failure: Secondary | ICD-10-CM | POA: Diagnosis not present

## 2018-05-16 DIAGNOSIS — E1122 Type 2 diabetes mellitus with diabetic chronic kidney disease: Secondary | ICD-10-CM | POA: Diagnosis not present

## 2018-05-16 DIAGNOSIS — N183 Chronic kidney disease, stage 3 (moderate): Secondary | ICD-10-CM | POA: Diagnosis not present

## 2018-05-16 DIAGNOSIS — I482 Chronic atrial fibrillation: Secondary | ICD-10-CM | POA: Diagnosis not present

## 2018-05-16 DIAGNOSIS — I130 Hypertensive heart and chronic kidney disease with heart failure and stage 1 through stage 4 chronic kidney disease, or unspecified chronic kidney disease: Secondary | ICD-10-CM | POA: Diagnosis not present

## 2018-05-16 DIAGNOSIS — I429 Cardiomyopathy, unspecified: Secondary | ICD-10-CM | POA: Diagnosis not present

## 2018-05-16 LAB — BASIC METABOLIC PANEL
BUN / CREAT RATIO: 20 (ref 10–24)
BUN: 19 mg/dL (ref 8–27)
CHLORIDE: 100 mmol/L (ref 96–106)
CO2: 26 mmol/L (ref 20–29)
Calcium: 9.5 mg/dL (ref 8.6–10.2)
Creatinine, Ser: 0.96 mg/dL (ref 0.76–1.27)
GFR calc Af Amer: 82 mL/min/{1.73_m2} (ref >59)
GFR calc non Af Amer: 71 mL/min/{1.73_m2} (ref >59)
GLUCOSE: 135 mg/dL — AB (ref 65–99)
Potassium: 4.3 mmol/L (ref 3.5–5.2)
Sodium: 139 mmol/L (ref 134–144)

## 2018-05-16 LAB — CBC WITH DIFFERENTIAL/PLATELET
BASOS ABS: 0 10*3/uL (ref 0.0–0.2)
Basos: 1 %
EOS (ABSOLUTE): 0.1 10*3/uL (ref 0.0–0.4)
Eos: 2 %
HEMOGLOBIN: 11.7 g/dL — AB (ref 13.0–17.7)
Hematocrit: 36.4 % — ABNORMAL LOW (ref 37.5–51.0)
Immature Grans (Abs): 0 10*3/uL (ref 0.0–0.1)
Immature Granulocytes: 0 %
Lymphocytes Absolute: 1.1 10*3/uL (ref 0.7–3.1)
Lymphs: 18 %
MCH: 31.5 pg (ref 26.6–33.0)
MCHC: 32.1 g/dL (ref 31.5–35.7)
MCV: 98 fL — ABNORMAL HIGH (ref 79–97)
MONOCYTES: 10 %
MONOS ABS: 0.6 10*3/uL (ref 0.1–0.9)
Neutrophils Absolute: 4 10*3/uL (ref 1.4–7.0)
Neutrophils: 69 %
PLATELETS: 179 10*3/uL (ref 150–450)
RBC: 3.72 x10E6/uL — AB (ref 4.14–5.80)
RDW: 15.5 % — AB (ref 12.3–15.4)
WBC: 5.7 10*3/uL (ref 3.4–10.8)

## 2018-05-16 NOTE — Telephone Encounter (Signed)
Informed patient of lab results.  Verbalized understanding.

## 2018-05-16 NOTE — Telephone Encounter (Signed)
New Message   Patients daughter [name] is returning call in reference to patients lab results. Please call.

## 2018-05-20 DIAGNOSIS — I482 Chronic atrial fibrillation: Secondary | ICD-10-CM | POA: Diagnosis not present

## 2018-05-20 DIAGNOSIS — I429 Cardiomyopathy, unspecified: Secondary | ICD-10-CM | POA: Diagnosis not present

## 2018-05-20 DIAGNOSIS — I130 Hypertensive heart and chronic kidney disease with heart failure and stage 1 through stage 4 chronic kidney disease, or unspecified chronic kidney disease: Secondary | ICD-10-CM | POA: Diagnosis not present

## 2018-05-20 DIAGNOSIS — I5043 Acute on chronic combined systolic (congestive) and diastolic (congestive) heart failure: Secondary | ICD-10-CM | POA: Diagnosis not present

## 2018-05-20 DIAGNOSIS — N183 Chronic kidney disease, stage 3 (moderate): Secondary | ICD-10-CM | POA: Diagnosis not present

## 2018-05-20 DIAGNOSIS — E1122 Type 2 diabetes mellitus with diabetic chronic kidney disease: Secondary | ICD-10-CM | POA: Diagnosis not present

## 2018-05-22 DIAGNOSIS — I482 Chronic atrial fibrillation: Secondary | ICD-10-CM | POA: Diagnosis not present

## 2018-05-22 DIAGNOSIS — E1122 Type 2 diabetes mellitus with diabetic chronic kidney disease: Secondary | ICD-10-CM | POA: Diagnosis not present

## 2018-05-22 DIAGNOSIS — I429 Cardiomyopathy, unspecified: Secondary | ICD-10-CM | POA: Diagnosis not present

## 2018-05-22 DIAGNOSIS — N183 Chronic kidney disease, stage 3 (moderate): Secondary | ICD-10-CM | POA: Diagnosis not present

## 2018-05-22 DIAGNOSIS — I5043 Acute on chronic combined systolic (congestive) and diastolic (congestive) heart failure: Secondary | ICD-10-CM | POA: Diagnosis not present

## 2018-05-22 DIAGNOSIS — I130 Hypertensive heart and chronic kidney disease with heart failure and stage 1 through stage 4 chronic kidney disease, or unspecified chronic kidney disease: Secondary | ICD-10-CM | POA: Diagnosis not present

## 2018-05-28 ENCOUNTER — Ambulatory Visit (INDEPENDENT_AMBULATORY_CARE_PROVIDER_SITE_OTHER): Payer: Medicare HMO | Admitting: *Deleted

## 2018-05-28 ENCOUNTER — Telehealth: Payer: Self-pay | Admitting: Cardiology

## 2018-05-28 DIAGNOSIS — Z950 Presence of cardiac pacemaker: Secondary | ICD-10-CM

## 2018-05-28 DIAGNOSIS — I5022 Chronic systolic (congestive) heart failure: Secondary | ICD-10-CM

## 2018-05-28 DIAGNOSIS — I428 Other cardiomyopathies: Secondary | ICD-10-CM | POA: Diagnosis not present

## 2018-05-28 DIAGNOSIS — Z95810 Presence of automatic (implantable) cardiac defibrillator: Secondary | ICD-10-CM

## 2018-05-28 NOTE — Telephone Encounter (Signed)
LMOVM reminding pt to send remote transmission.   

## 2018-05-29 ENCOUNTER — Encounter: Payer: Self-pay | Admitting: Cardiology

## 2018-05-29 NOTE — Progress Notes (Signed)
Remote pacemaker transmission.   

## 2018-05-29 NOTE — Progress Notes (Signed)
EPIC Encounter for ICM Monitoring  Patient Name: [name] is a 84 y.o. male Date: 05/29/2018 Primary Care Physican: Renato Shin, MD Primary Cardiologist:Crenshaw Electrophysiologist:Taylor Dry Weight:231lbs       Spoke with daughter, Trinidad Curet.  Heart Failure questions reviewed, pt has chronic swelling in ankle and feet but no worse than usual. No swelling in legs.  She said patient feels the best he has felt in the last few months.  He's had low BP in the past but daughter said it has been normal since his medications were either discontinued or adjusted.     Thoracic impedance continues to be abnormal suggesting fluid accumulation starting 03/26/2018.  Prescribed dosage: Furosemide60 mgTake 1 tablet once daily. Potassium 20 mEq 2 tablet daily.  Labs: 05/15/2018 Creatinine 0.96, BUN 19, Potassium 4.3, Sodium 139, EGFR 71-82 04/29/2018 Creatinine 1.10, BUN 18, Potassium 3.7, Sodium 139, EGFR 60-69 04/10/2018 Creatinine1.22, BUN29, Potassium4.4, [id number], EGFR3-61 04/01/2018 Creatinine1.30, BUN31, Potassium4.3, Sodium135, [id number]  03/20/2018 Creatinine1.84, BUN55, Potassium4.5, Sodium131  03/07/2018 Creatinine1.69, BUN63, Potassium5.3, Sodium133, [id number]  02/23/2018 Creatinine1.39, BUN48, Potassium4.3, Sodium134, YEMV36-12  02/22/2018 Creatinine1.24, BUN37, Potassium3.9, Sodium135, EGFR50-58  02/21/2018 Creatinine1.29, BUN35, Potassium3.5, AESLPN300, FRTM21-11  02/20/2018 Creatinine1.08, BUN28, Potassium3.7, NBVAPO141, EGFR60->60  02/19/2018 Creatinine1.05, BUN33, Potassium3.6, [id number]  02/18/2018 Creatinine1.24, BUN25, Potassium4.4, OILNZV728, ASUO15-61  02/17/2018 Creatinine1.12, BUN34, Potassium4.9, Sodium140 02/10/2018 Creatinine1.30, BUN45, Potassium4.8, Sodium139  01/27/2018 Creatinine1.30, BUN45, Potassium4.8, Sodium139  01/14/2018 Creatinine1.85, BUN80, Potassium4.8, Sodium 138    12/25/2017 Creatinine1.37, BUN61, Potassium4.9, [id number]  12/16/2017 Creatinine1.36, BUN55, Potassium4.9, Sodium134 A complete set of results can be found in Results Review.  Recommendations:  No changes.  Reminder to limit fluid and salt intake as previously discussed.   Encouraged to call for fluid symptoms.  Follow-up plan: ICM clinic phone appointment on 06/10/2018 to recheck fluid levels.  Office appointment scheduled 07/15/2018 with Dr. [name].  Copy of ICM check sent to Dr. Lovena Le and Dr. [name] for review and recommendations if needed.   3 month ICM trend: 05/29/2018    1 Year ICM trend:       Rosalene Billings, RN 05/29/2018 2:48 PM

## 2018-05-30 LAB — CUP PACEART REMOTE DEVICE CHECK
Battery Voltage: 3 V
Brady Statistic AP VP Percent: 0 %
Brady Statistic AS VP Percent: 0 %
Brady Statistic AS VS Percent: 0 %
Brady Statistic RV Percent Paced: 74.2 %
Implantable Lead Implant Date: 20131105
Implantable Lead Implant Date: 20131105
Implantable Lead Location: 753858
Implantable Lead Location: 753859
Implantable Lead Location: 753860
Implantable Lead Model: 4296
Implantable Lead Model: 5076
Implantable Lead Model: 6935
Lead Channel Impedance Value: 304 Ohm
Lead Channel Impedance Value: 323 Ohm
Lead Channel Impedance Value: 342 Ohm
Lead Channel Impedance Value: 399 Ohm
Lead Channel Impedance Value: 418 Ohm
Lead Channel Impedance Value: 437 Ohm
Lead Channel Impedance Value: 475 Ohm
Lead Channel Pacing Threshold Amplitude: 1 V
Lead Channel Pacing Threshold Pulse Width: 1.5 ms
Lead Channel Sensing Intrinsic Amplitude: 12.375 mV
Lead Channel Setting Sensing Sensitivity: 0.9 mV
MDC IDC LEAD IMPLANT DT: 20131105
MDC IDC MSMT BATTERY REMAINING LONGEVITY: 51 mo
MDC IDC MSMT LEADCHNL LV IMPEDANCE VALUE: 342 Ohm
MDC IDC MSMT LEADCHNL LV IMPEDANCE VALUE: 589 Ohm
MDC IDC MSMT LEADCHNL RV PACING THRESHOLD AMPLITUDE: 0.625 V
MDC IDC MSMT LEADCHNL RV PACING THRESHOLD PULSEWIDTH: 0.4 ms
MDC IDC MSMT LEADCHNL RV SENSING INTR AMPL: 12.375 mV
MDC IDC PG IMPLANT DT: 20170818
MDC IDC SESS DTM: 20190620023708
MDC IDC SET LEADCHNL LV PACING AMPLITUDE: 2 V
MDC IDC SET LEADCHNL LV PACING PULSEWIDTH: 1.5 ms
MDC IDC SET LEADCHNL RV PACING AMPLITUDE: 2 V
MDC IDC SET LEADCHNL RV PACING PULSEWIDTH: 0.4 ms
MDC IDC STAT BRADY AP VS PERCENT: 0 %
MDC IDC STAT BRADY RA PERCENT PACED: 0 %

## 2018-06-10 ENCOUNTER — Telehealth: Payer: Self-pay | Admitting: Cardiology

## 2018-06-10 ENCOUNTER — Ambulatory Visit (INDEPENDENT_AMBULATORY_CARE_PROVIDER_SITE_OTHER): Payer: Medicare HMO

## 2018-06-10 DIAGNOSIS — Z950 Presence of cardiac pacemaker: Secondary | ICD-10-CM | POA: Diagnosis not present

## 2018-06-10 DIAGNOSIS — I5022 Chronic systolic (congestive) heart failure: Secondary | ICD-10-CM | POA: Diagnosis not present

## 2018-06-10 NOTE — Telephone Encounter (Signed)
LMOVM reminding pt to send remote transmission.   

## 2018-06-11 NOTE — Progress Notes (Signed)
EPIC Encounter for ICM Monitoring  Patient Name: [name] is a 84 y.o. male Date: 06/11/2018 Primary Care Physican: Renato Shin, MD Primary Cardiologist:Crenshaw Electrophysiologist:Taylor Dry Weight:Previous weight 231lbs                                                                        Attempted call to daughter, Trinidad Curet.   Left detailed message.  Transmission reviewed.  History of chronic swelling in ankle and feet.   Thoracic impedance continues to be abnormal suggesting fluid accumulation starting 03/25/2018.  Prescribed dosage: Furosemide60 mgTake 1 tablet once daily. Potassium 20 mEq 2 tablet daily.  Labs: 05/15/2018 Creatinine 0.96, BUN 19, Potassium 4.3, Sodium 139, EGFR 71-82 04/29/2018 Creatinine 1.10, BUN 18, Potassium 3.7, Sodium 139, EGFR 60-69 04/10/2018 Creatinine1.22, BUN29, Potassium4.4, DGLOVF643, EGFR3-61 04/01/2018 Creatinine1.30, BUN31, Potassium4.3, Sodium135, PIRJ18-84  03/20/2018 Creatinine1.84, BUN55, Potassium4.5, Sodium131  03/07/2018 Creatinine1.69, BUN63, Potassium5.3, Sodium133, ZYSA63-01  02/23/2018 Creatinine1.39, BUN48, Potassium4.3, Sodium134, SWFU93-23  02/22/2018 Creatinine1.24, BUN37, Potassium3.9, Sodium135, EGFR50-58  02/21/2018 Creatinine1.29, BUN35, Potassium3.5, FTDDUK025, KYHC62-37  02/20/2018 Creatinine1.08, BUN28, Potassium3.7, SEGBTD176, EGFR60->60  02/19/2018 Creatinine1.05, BUN33, Potassium3.6, [id number]  02/18/2018 Creatinine1.24, BUN25, Potassium4.4, YIRSWN462, VOJJ00-93  02/17/2018 Creatinine1.12, BUN34, Potassium4.9, Sodium140 02/10/2018 Creatinine1.30, BUN45, Potassium4.8, Sodium139  01/27/2018 Creatinine1.30, BUN45, Potassium4.8, Sodium139  01/14/2018 Creatinine1.85, BUN80, Potassium4.8, Sodium 138  12/25/2017 Creatinine1.37, BUN61, Potassium4.9, [id number]  12/16/2017 Creatinine1.36, BUN55, Potassium4.9, Sodium134 A complete  set of results can be found in Results Review.  Recommendations:  Left voice mail with ICM number and encouraged to call if patient experiencing any fluid symptoms.  Follow-up plan: ICM clinic phone appointment on 06/30/2018.  Office appointment scheduled 07/15/2018 with Dr. [name].  Copy of ICM check sent to Dr. Lovena Le and Dr. [name] for review and recommendations if needed.   3 month ICM trend: 06/10/2018    1 Year ICM trend:       Rosalene Billings, RN 06/11/2018 8:14 AM

## 2018-06-23 ENCOUNTER — Ambulatory Visit (INDEPENDENT_AMBULATORY_CARE_PROVIDER_SITE_OTHER): Payer: Medicare HMO | Admitting: Endocrinology

## 2018-06-23 ENCOUNTER — Encounter: Payer: Self-pay | Admitting: Endocrinology

## 2018-06-23 VITALS — BP 138/84 | HR 82 | Wt 232.8 lb

## 2018-06-23 DIAGNOSIS — E1151 Type 2 diabetes mellitus with diabetic peripheral angiopathy without gangrene: Secondary | ICD-10-CM | POA: Diagnosis not present

## 2018-06-23 LAB — POCT GLYCOSYLATED HEMOGLOBIN (HGB A1C): Hemoglobin A1C: 6 % — AB (ref 4.0–5.6)

## 2018-06-23 MED ORDER — TAMSULOSIN HCL 0.4 MG PO CAPS: 0.4000 mg | ORAL_CAPSULE | Freq: Two times a day (BID) | ORAL | 2 refills | Status: DC

## 2018-06-23 NOTE — Patient Instructions (Addendum)
Please continue the same medications.   You can increase the Miralax to twice a day.   Please come back for a follow-up appointment in 3 months.

## 2018-06-23 NOTE — Progress Notes (Signed)
Subjective:    Patient ID: [name], male    DOB: June 29, 1929, 84 y.o.   MRN: [id number]  HPI Pt returns for f/u of diabetes mellitus:  DM type: 2.  Dx'ed: 2008.  Complications: renal insufficiency. Therapy: no medication now.   DKA: never.  Severe hypoglycemia: never.   Pancreatitis: never.   Interval history: no change in chronic leg edema.  He takes meds as rx'ed.   Past Medical History:  Diagnosis Date  . Anemia   . Atrial fibrillation (HCC)   . BPH (benign prostatic hyperplasia)   . Bradycardia   . Cardiomyopathy   . CHF (congestive heart failure) (HCC)   . Diabetes mellitus type II   . GERD (gastroesophageal reflux disease)   . History of colonoscopy   . HTN (hypertension)   . Hyperlipidemia   . ICD (implantable cardiac defibrillator) in place 10/14/2012   biventricular  . OA (osteoarthritis)   . Obesity   . Peptic ulcer     Past Surgical History:  Procedure Laterality Date  . EP IMPLANTABLE DEVICE Left   . EP IMPLANTABLE DEVICE N/A 07/27/2016   Procedure: BIV Pacemaker downgrade;  Surgeon: Marinus Maw, MD;  Location: [facility] INVASIVE CV LAB;  Service: Cardiovascular;  Laterality: N/A;  . ESOPHAGOGASTRODUODENOSCOPY  06/24/2002  . L-spine  1965  . Lumbar L4-5 & S1  02/2000  . TOTAL KNEE ARTHROPLASTY  1997   right  . TOTAL KNEE ARTHROPLASTY Left 02/15/2015   Procedure: LEFT TOTAL KNEE ARTHROPLASTY;  Surgeon: Durene Romans, MD;  Location: [facility]S;  Service: Orthopedics;  Laterality: Left;    Social History   Socioeconomic History  . Marital status: Widowed    Spouse name: Not on file  . Number of children: Not on file  . Years of education: Not on file  . Highest education level: Not on file  Occupational History  . Occupation: Retired  Engineer, production  . Financial resource strain: Not on file  . Food insecurity:    Worry: Not on file    Inability: Not on file  . Transportation needs:    Medical: Not on file    Non-medical: Not on file  Tobacco Use    . Smoking status: Never Smoker  . Smokeless tobacco: Never Used  Substance and Sexual Activity  . Alcohol use: No  . Drug use: No  . Sexual activity: Not on file  Lifestyle  . Physical activity:    Days per week: Not on file    Minutes per session: Not on file  . Stress: Not on file  Relationships  . Social connections:    Talks on phone: Not on file    Gets together: Not on file    Attends religious service: Not on file    Active member of club or organization: Not on file    Attends meetings of clubs or organizations: Not on file    Relationship status: Not on file  . Intimate partner violence:    Fear of current or ex partner: Not on file    Emotionally abused: Not on file    Physically abused: Not on file    Forced sexual activity: Not on file  Other Topics Concern  . Not on file  Social History Narrative  . Not on file    Current Outpatient Medications on File Prior to Visit  Medication Sig Dispense Refill  . acetaminophen (TYLENOL) 650 MG CR tablet Take 650-1,300 mg by mouth every 8 (eight)  hours as needed for pain.    [name] allopurinol (ZYLOPRIM) 100 MG tablet TAKE 1 TABLET EVERY DAY (Patient taking differently: TAKE 1 TABLET (100mg ) EVERY DAY) 90 tablet 1  . apixaban (ELIQUIS) 5 MG TABS tablet Take 1 tablet (5 mg total) by mouth 2 (two) times daily. 60 tablet 5  . atorvastatin (LIPITOR) 20 MG tablet TAKE 1 TABLET EVERY DAY (Patient taking differently: TAKE 1 TABLET (20mg ) EVERY DAY) 90 tablet 3  . cholecalciferol (VITAMIN D) 1000 units tablet Take 1,000 Units by mouth daily.    [name] donepezil (ARICEPT) 5 MG tablet Take 1 tablet (5 mg total) by mouth at bedtime. 90 tablet 3  . furosemide (LASIX) 40 MG tablet Take 1.5 tablets (60 mg total) every morning. 135 tablet 3  . magnesium oxide (MAG-OX) 400 (241.3 Mg) MG tablet Take 1 tablet (400 mg total) by mouth 2 (two) times daily. 90 tablet 3  . polyethylene glycol (MIRALAX / GLYCOLAX) packet Take 17 g by mouth daily as needed  (constipation).    . potassium chloride SA (K-DUR,KLOR-CON) 20 MEQ tablet Take 20 mEq by mouth daily.     No current facility-administered medications on file prior to visit.     No Known Allergies  Family History  Problem Relation Age of Onset  . Heart disease Father   . Heart disease Mother   . Diabetes Mother   . Hypertension Brother   . Hypertension Brother   . Cancer Neg Hx     BP 138/84 (BP Location: Right Arm, Patient Position: Sitting, Cuff Size: Normal)   Pulse 82   Wt 232 lb 12.8 oz (105.6 kg)   SpO2 95%   BMI 34.88 kg/m    Review of Systems Leg swelling is slightly worse.  Denies sob.  He has constipation.      Objective:   Physical Exam VITAL SIGNS:  See vs page.   GENERAL: no distress.  Pulses: foot pulses are intact bilaterally.   MSK: no deformity of the feet or ankles.  CV: 2+ bilat edema of the legs, and bilat vv's Skin: severe bilat rust discoloration of the legs. Heavy calluses on the feet. no ulcer on the feet.  feet and legs are of normal temp, but are slightly cyanotic.   Neuro: sensation is intact to touch on the feet and ankles.  Ext: There is bilateral onychomycosis of the toenails.     Lab Results  Component Value Date   HGBA1C 6.0 (A) 06/23/2018   Lab Results  Component Value Date   CREATININE 0.96 05/15/2018   BUN 19 05/15/2018   NA 139 05/15/2018   K 4.3 05/15/2018   CL 100 05/15/2018   CO2 26 05/15/2018       Assessment & Plan:  Type 2 DM, with renal insuff: well-controlled.  Edema: seems the same to me.    Constipation: he needs increased rx.    Patient Instructions  Please continue the same medications.   You can increase the Miralax to twice a day.   Please come back for a follow-up appointment in 3 months.

## 2018-06-30 ENCOUNTER — Telehealth: Payer: Self-pay

## 2018-06-30 NOTE — Telephone Encounter (Signed)
LMOVM reminding pt to send remote transmission.   

## 2018-07-03 NOTE — Progress Notes (Signed)
No ICM remote transmission received for 06/30/2018 and next ICM transmission scheduled for 07/14/2018.

## 2018-07-08 ENCOUNTER — Ambulatory Visit: Payer: Medicare HMO | Admitting: Cardiology

## 2018-07-08 ENCOUNTER — Telehealth: Payer: Self-pay

## 2018-07-08 NOTE — Telephone Encounter (Signed)
Pt daughter stated they was having a difficult time sending a transmission. I gave them the number to [facility].

## 2018-07-09 NOTE — Progress Notes (Signed)
HPI: FU nonischemic cardiomyopathy as well as atrial fibrillation. Note, he had a catheterization in January 2006 that showed no coronary artery disease and an ejection fraction of 40%. He had a Myoview performed last on May 04, 2008, that showed an ejection fraction of 31%. There was felt to be a prior inferior infarct with mild peri-infarct ischemia. I did review this and felt there was a low risk and we have been treating medically. He does have permanent atrial fibrillation as well. Holter monitor in August of 2011 showed mildly reduced rate and we decreased his Coreg. In November of 2013 the patient had a biventricular ICD placed; downgraded to BIV pacemaker 8/17. Echocardiogram March 2019 showed ejection fraction 40 to 45%, moderate left ventricular hypertrophy, moderate left ventricular enlargement, mild to moderate mitral regurgitation, severe biatrial enlargement.  Since I last saw him,he feels much better.  He does not have dizziness with standing.  He denies dyspnea, chest pain, palpitations, syncope or bleeding.  Current Outpatient Medications  Medication Sig Dispense Refill  . acetaminophen (TYLENOL) 650 MG CR tablet Take 650-1,300 mg by mouth every 8 (eight) hours as needed for pain.    [name] allopurinol (ZYLOPRIM) 100 MG tablet TAKE 1 TABLET EVERY DAY (Patient taking differently: TAKE 1 TABLET (100mg ) EVERY DAY) 90 tablet 1  . apixaban (ELIQUIS) 5 MG TABS tablet Take 1 tablet (5 mg total) by mouth 2 (two) times daily. 60 tablet 5  . atorvastatin (LIPITOR) 20 MG tablet TAKE 1 TABLET EVERY DAY (Patient taking differently: TAKE 1 TABLET (20mg ) EVERY DAY) 90 tablet 3  . cholecalciferol (VITAMIN D) 1000 units tablet Take 1,000 Units by mouth daily.    [name] donepezil (ARICEPT) 5 MG tablet Take 1 tablet (5 mg total) by mouth at bedtime. 90 tablet 3  . furosemide (LASIX) 40 MG tablet Take 1.5 tablets (60 mg total) every morning. 135 tablet 3  . magnesium oxide (MAG-OX) 400 (241.3 Mg) MG tablet  Take 1 tablet (400 mg total) by mouth 2 (two) times daily. 90 tablet 3  . polyethylene glycol (MIRALAX / GLYCOLAX) packet Take 17 g by mouth daily as needed (constipation).    . potassium chloride SA (K-DUR,KLOR-CON) 20 MEQ tablet Take 20 mEq by mouth daily.    . tamsulosin (FLOMAX) 0.4 MG CAPS capsule Take 1 capsule (0.4 mg total) by mouth 2 (two) times daily. 180 capsule 2   No current facility-administered medications for this visit.      Past Medical History:  Diagnosis Date  . Anemia   . Atrial fibrillation (HCC)   . BPH (benign prostatic hyperplasia)   . Bradycardia   . Cardiomyopathy   . CHF (congestive heart failure) (HCC)   . Diabetes mellitus type II   . GERD (gastroesophageal reflux disease)   . History of colonoscopy   . HTN (hypertension)   . Hyperlipidemia   . ICD (implantable cardiac defibrillator) in place 10/14/2012   biventricular  . OA (osteoarthritis)   . Obesity   . Peptic ulcer     Past Surgical History:  Procedure Laterality Date  . EP IMPLANTABLE DEVICE Left   . EP IMPLANTABLE DEVICE N/A 07/27/2016   Procedure: BIV Pacemaker downgrade;  Surgeon: Marinus Maw, MD;  Location: [facility] INVASIVE CV LAB;  Service: Cardiovascular;  Laterality: N/A;  . ESOPHAGOGASTRODUODENOSCOPY  06/24/2002  . L-spine  1965  . Lumbar L4-5 & S1  02/2000  . TOTAL KNEE ARTHROPLASTY  1997   right  . TOTAL  KNEE ARTHROPLASTY Left 02/15/2015   Procedure: LEFT TOTAL KNEE ARTHROPLASTY;  Surgeon: Durene Romans, MD;  Location: [facility];  Service: Orthopedics;  Laterality: Left;    Social History   Socioeconomic History  . Marital status: Widowed    Spouse name: Not on file  . Number of children: Not on file  . Years of education: Not on file  . Highest education level: Not on file  Occupational History  . Occupation: Retired  Engineer, production  . Financial resource strain: Not on file  . Food insecurity:    Worry: Not on file    Inability: Not on file  . Transportation needs:     Medical: Not on file    Non-medical: Not on file  Tobacco Use  . Smoking status: Never Smoker  . Smokeless tobacco: Never Used  Substance and Sexual Activity  . Alcohol use: No  . Drug use: No  . Sexual activity: Not on file  Lifestyle  . Physical activity:    Days per week: Not on file    Minutes per session: Not on file  . Stress: Not on file  Relationships  . Social connections:    Talks on phone: Not on file    Gets together: Not on file    Attends religious service: Not on file    Active member of club or organization: Not on file    Attends meetings of clubs or organizations: Not on file    Relationship status: Not on file  . Intimate partner violence:    Fear of current or ex partner: Not on file    Emotionally abused: Not on file    Physically abused: Not on file    Forced sexual activity: Not on file  Other Topics Concern  . Not on file  Social History Narrative  . Not on file    Family History  Problem Relation Age of Onset  . Heart disease Father   . Heart disease Mother   . Diabetes Mother   . Hypertension Brother   . Hypertension Brother   . Cancer Neg Hx     ROS: no fevers or chills, productive cough, hemoptysis, dysphasia, odynophagia, melena, hematochezia, dysuria, hematuria, rash, seizure activity, orthopnea, PND, claudication. Remaining systems are negative.  Physical Exam: Well-developed well-nourished in no acute distress.  Skin is warm and dry.  HEENT is normal.  Neck is supple.  Chest is clear to auscultation with normal expansion.  Cardiovascular exam is irregular Abdominal exam nontender or distended. No masses palpated. Extremities show 1+ edema. neuro grossly intact  A/P  1 chronic combined systolic/diastolic congestive heart failure-patient appears to be doing well from a symptomatic standpoint.  Continue present dose of Lasix.  Continue fluid restriction and low-sodium diet.  Patient will weigh daily and take an additional 40 mg of  Lasix for weight gain of 2 to 3 pounds.  Check potassium and renal function.  2 Permanent atrial fibrillation-heart rate appears to be controlled on no AV nodal blocking agents.  Continue apixaban.  Check hemoglobin.  3 previous PVCs-amiodarone discontinued previously.  4 cardiomyopathy-patient had significant hypotension previously with orthostatic symptoms.  His ACE inhibitor and beta-blocker were discontinued.  His symptoms have improved.  I will resume lisinopril 2.5 mg daily to see if he tolerates.  He was on this dose previously along with low-dose Coreg.  Hopefully his blood pressure will tolerate lisinopril alone.  5 hypertension-blood pressure is controlled.  Add low-dose lisinopril for cardiomyopathy as outlined above.  6 prior ICD-followed by electrophysiology.  Olga Millers, MD

## 2018-07-10 ENCOUNTER — Telehealth: Payer: Self-pay

## 2018-07-10 NOTE — Telephone Encounter (Signed)
Attempted return call as requested by voice mail about monitoring not working.  Left voice mail message with [employer] tech support number of [phone] and encouraged to call if monitor is not working.

## 2018-07-15 ENCOUNTER — Encounter: Payer: Self-pay | Admitting: Cardiology

## 2018-07-15 ENCOUNTER — Ambulatory Visit: Payer: Medicare HMO | Admitting: Cardiology

## 2018-07-15 ENCOUNTER — Other Ambulatory Visit: Payer: Self-pay | Admitting: *Deleted

## 2018-07-15 VITALS — BP 140/78 | HR 86 | Ht 68.0 in | Wt 230.0 lb

## 2018-07-15 DIAGNOSIS — I5042 Chronic combined systolic (congestive) and diastolic (congestive) heart failure: Secondary | ICD-10-CM

## 2018-07-15 DIAGNOSIS — I480 Paroxysmal atrial fibrillation: Secondary | ICD-10-CM | POA: Diagnosis not present

## 2018-07-15 DIAGNOSIS — I10 Essential (primary) hypertension: Secondary | ICD-10-CM | POA: Diagnosis not present

## 2018-07-15 DIAGNOSIS — I428 Other cardiomyopathies: Secondary | ICD-10-CM | POA: Diagnosis not present

## 2018-07-15 MED ORDER — LISINOPRIL 2.5 MG PO TABS: 2.5000 mg | ORAL_TABLET | Freq: Every day | ORAL | 3 refills | Status: DC

## 2018-07-15 MED ORDER — POTASSIUM CHLORIDE CRYS ER 20 MEQ PO TBCR: 20.0000 meq | EXTENDED_RELEASE_TABLET | Freq: Every day | ORAL | 3 refills | Status: DC

## 2018-07-15 NOTE — Patient Instructions (Signed)
Medication Instructions:   START LISINOPRIL 2.5 MG ONCE DAILY  Labwork:  Your physician recommends that you HAVE LAB WORK TODAY  Follow-Up:  Your physician recommends that you schedule a follow-up appointment in: 3 MONTHS WITH DR Jens Som   If you need a refill on your cardiac medications before your next appointment, please call your pharmacy.

## 2018-07-16 ENCOUNTER — Encounter: Payer: Self-pay | Admitting: *Deleted

## 2018-07-16 LAB — BASIC METABOLIC PANEL
BUN / CREAT RATIO: 21 (ref 10–24)
BUN: 19 mg/dL (ref 8–27)
CO2: 25 mmol/L (ref 20–29)
CREATININE: 0.89 mg/dL (ref 0.76–1.27)
Calcium: 9.2 mg/dL (ref 8.6–10.2)
Chloride: 99 mmol/L (ref 96–106)
GFR calc non Af Amer: 77 mL/min/{1.73_m2} (ref >59)
GFR, EST AFRICAN AMERICAN: 89 mL/min/{1.73_m2} (ref >59)
GLUCOSE: 114 mg/dL — AB (ref 65–99)
Potassium: 4.1 mmol/L (ref 3.5–5.2)
SODIUM: 141 mmol/L (ref 134–144)

## 2018-07-16 LAB — CBC
Hematocrit: 36.9 % — ABNORMAL LOW (ref 37.5–51.0)
Hemoglobin: 12 g/dL — ABNORMAL LOW (ref 13.0–17.7)
MCH: 30.8 pg (ref 26.6–33.0)
MCHC: 32.5 g/dL (ref 31.5–35.7)
MCV: 95 fL (ref 79–97)
PLATELETS: 134 10*3/uL — AB (ref 150–450)
RBC: 3.9 x10E6/uL — ABNORMAL LOW (ref 4.14–5.80)
RDW: 14.2 % (ref 12.3–15.4)
WBC: 6 10*3/uL (ref 3.4–10.8)

## 2018-07-18 ENCOUNTER — Telehealth: Payer: Self-pay

## 2018-07-18 ENCOUNTER — Ambulatory Visit (INDEPENDENT_AMBULATORY_CARE_PROVIDER_SITE_OTHER): Payer: Self-pay

## 2018-07-18 DIAGNOSIS — I5042 Chronic combined systolic (congestive) and diastolic (congestive) heart failure: Secondary | ICD-10-CM

## 2018-07-18 DIAGNOSIS — Z95810 Presence of automatic (implantable) cardiac defibrillator: Secondary | ICD-10-CM

## 2018-07-18 NOTE — Telephone Encounter (Signed)
Remote ICM transmission received.  Attempted call to daughter and left detailed message, per DPR, regarding transmission and next ICM scheduled for 08/18/2018.  Advised to return call for any fluid symptoms or questions.

## 2018-07-18 NOTE — Progress Notes (Signed)
EPIC Encounter for ICM Monitoring  Patient Name: [name] is a 84 y.o. male Date: 07/18/2018 Primary Care Physican: Renato Shin, MD Primary Cardiologist:Crenshaw Electrophysiologist:Taylor Dry Weight:Previous weight 231lbs BiV Pacing: 86.7%  Attempted call to daughter, Trinidad Curet. Attempted call to patient and unable to reach.  Left detailed message, per DPR, regarding transmission.  Transmission reviewed.    Thoracic impedance remains abnormal suggesting fluid accumulation starting 03/24/2018.  Prescribed dosage: Furosemide40 mgTake 1.5 tablets once daily. Per Dr Jacalyn Lefevre office visit note 8/6, he can take an additional 40 mg of Lasix for weight gain of 2 to 3 pounds. Potassium 20 mEq 1 tablet daily.  Labs: 07/15/2018 Creatinine 0.89, BUN 19, Potassium 4.1, Sodium 141, EGFR 77-89 05/15/2018 Creatinine 0.96, BUN 19, Potassium 4.3, Sodium 139, EGFR 71-82 04/29/2018 Creatinine 1.10, BUN 18, Potassium 3.7, Sodium 139, EGFR 60-69 04/10/2018 Creatinine1.22, BUN29, Potassium4.4, [id number], EGFR3-61 04/01/2018 Creatinine1.30, BUN31, Potassium4.3, Sodium135, [id number]  03/20/2018 Creatinine1.84, BUN55, Potassium4.5, Sodium131  03/07/2018 Creatinine1.69, BUN63, Potassium5.3, Sodium133, [id number]  02/23/2018 Creatinine1.39, BUN48, Potassium4.3, Sodium134, [id number]  02/22/2018 Creatinine1.24, BUN37, Potassium3.9, Sodium135, EGFR50-58  02/21/2018 Creatinine1.29, BUN35, Potassium3.5, [id number], [id number]  02/20/2018 Creatinine1.08, BUN28, Potassium3.7, [id number], EGFR60->60  02/19/2018 Creatinine1.05, BUN33, Potassium3.6, [id number]  02/18/2018 Creatinine1.24, BUN25, Potassium4.4, [id number], [id number]  02/17/2018 Creatinine1.12, BUN34, Potassium4.9, Sodium140 02/10/2018 Creatinine1.30, BUN45, Potassium4.8, Sodium139  01/27/2018 Creatinine1.30, BUN45, Potassium4.8, Sodium139  01/14/2018 Creatinine1.85,  BUN80, Potassium4.8, Sodium 138  12/25/2017 Creatinine1.37, BUN61, Potassium4.9, [id number]  12/16/2017 Creatinine1.36, BUN55, Potassium4.9, Sodium134 A complete set of results can be found in Results Review.  Recommendations: Left voice mail with ICM number and encouraged to call if experiencing any fluid symptoms.    Follow-up plan: ICM clinic phone appointment on 08/18/2018.    Copy of ICM check sent to Dr. Lovena Le and Dr [name].    3 month ICM trend: 07/18/2018    1 Year ICM trend:       Rosalene Billings, RN 07/18/2018 9:00 AM

## 2018-07-29 ENCOUNTER — Ambulatory Visit: Payer: Medicare HMO | Admitting: Podiatry

## 2018-07-29 ENCOUNTER — Encounter: Payer: Self-pay | Admitting: Podiatry

## 2018-07-29 DIAGNOSIS — E119 Type 2 diabetes mellitus without complications: Secondary | ICD-10-CM

## 2018-07-29 DIAGNOSIS — D689 Coagulation defect, unspecified: Secondary | ICD-10-CM | POA: Diagnosis not present

## 2018-07-29 DIAGNOSIS — M79675 Pain in left toe(s): Secondary | ICD-10-CM | POA: Diagnosis not present

## 2018-07-29 DIAGNOSIS — M79674 Pain in right toe(s): Secondary | ICD-10-CM | POA: Diagnosis not present

## 2018-07-29 DIAGNOSIS — B351 Tinea unguium: Secondary | ICD-10-CM | POA: Diagnosis not present

## 2018-07-29 NOTE — Progress Notes (Signed)
Complaint:  Visit Type: Patient returns to my office for continued preventative foot care services. Complaint: Patient states" my nails have grown long and thick and become painful to walk and wear shoes" Patient has been diagnosed with DM with no foot complications. The patient presents for preventative foot care services. No changes to ROS.  Patient is taking coumadin.  Podiatric Exam: Vascular: dorsalis pedis and posterior tibial pulses are palpable bilateral. Capillary return is immediate. Temperature gradient is WNL. Skin turgor WNL  Sensorium: Normal Semmes Weinstein monofilament test. Normal tactile sensation bilaterally. Nail Exam: Pt has thick disfigured discolored nails with subungual debris noted bilateral entire nail hallux through fifth toenails Ulcer Exam: There is no evidence of ulcer or pre-ulcerative changes or infection. Orthopedic Exam: Muscle tone and strength are WNL. No limitations in general ROM. No crepitus or effusions noted. Foot type and digits show no abnormalities. Bony prominences are unremarkable. Skin: Asymptomatic  Porokeratosis  Sub 1 sub heel right foot.. No infection or ulcers  Diagnosis:  Onychomycosis, , Pain in right toe, pain in left toes  Treatment & Plan Procedures and Treatment: Consent by patient was obtained for treatment procedures.   Debridement of mycotic and hypertrophic toenails, 1 through 5 bilateral and clearing of subungual debris. No ulceration, no infection noted.  Return Visit-Office Procedure: Patient instructed to return to the office for a follow up visit 3 months for continued evaluation and treatment.    Laquasia Pincus DPM 

## 2018-08-15 ENCOUNTER — Telehealth: Payer: Self-pay

## 2018-08-15 NOTE — Telephone Encounter (Signed)
Attempted return call to daughter as requested regarding needing to change ICM remote transmission date because she has surgery scheduled on 07/27/2018.   Left message transmission rescheduled to 08/26/2018.

## 2018-08-15 NOTE — Telephone Encounter (Signed)
Daughter left a message that she will be having surgery on 08/25/2018 and will need to reschedule remote transmission a couple of weeks after that date.  Left her a message stating changed remote transmission to 09/11/2018.

## 2018-08-26 ENCOUNTER — Other Ambulatory Visit: Payer: Self-pay | Admitting: Endocrinology

## 2018-08-26 ENCOUNTER — Other Ambulatory Visit: Payer: Self-pay | Admitting: Cardiology

## 2018-09-11 ENCOUNTER — Telehealth: Payer: Self-pay | Admitting: Cardiology

## 2018-09-11 ENCOUNTER — Ambulatory Visit (INDEPENDENT_AMBULATORY_CARE_PROVIDER_SITE_OTHER): Payer: Medicare HMO | Admitting: *Deleted

## 2018-09-11 ENCOUNTER — Ambulatory Visit (INDEPENDENT_AMBULATORY_CARE_PROVIDER_SITE_OTHER): Payer: Medicare HMO

## 2018-09-11 DIAGNOSIS — I5042 Chronic combined systolic (congestive) and diastolic (congestive) heart failure: Secondary | ICD-10-CM | POA: Diagnosis not present

## 2018-09-11 DIAGNOSIS — I428 Other cardiomyopathies: Secondary | ICD-10-CM

## 2018-09-11 DIAGNOSIS — Z95810 Presence of automatic (implantable) cardiac defibrillator: Secondary | ICD-10-CM | POA: Diagnosis not present

## 2018-09-11 NOTE — Telephone Encounter (Signed)
LMOVM reminding pt to send remote transmission.   

## 2018-09-12 ENCOUNTER — Telehealth: Payer: Self-pay

## 2018-09-12 NOTE — Progress Notes (Signed)
EPIC Encounter for ICM Monitoring  Patient Name: [name] is a 84 y.o. male Date: 09/12/2018 Primary Care Physican: Renato Shin, MD Primary Cardiologist:Crenshaw Electrophysiologist:Taylor Dry Weight:Previous weight231lbs BiV Pacing: 86.4%      Attempted call todaughter, Trinidad Curet.  Left detailed message regarding transmission.  Transmission reviewed.   Thoracic impedance remains abnormal suggesting fluid accumulation starting 03/24/2018.  Prescribed: Furosemide40 mgTake 1.5 tablets once daily. Per Dr Jacalyn Lefevre office visit note 8/6, he can take an additional 40 mg of Lasix for weight gain of 2 to 3 pounds. Potassium 20 mEq 1 tablet daily.  Labs: 07/15/2018 Creatinine 0.89, BUN 19, Potassium 4.1, Sodium 141, EGFR 77-89 05/15/2018 Creatinine 0.96, BUN 19, Potassium 4.3, Sodium 139, EGFR 71-82 04/29/2018 Creatinine 1.10, BUN 18, Potassium 3.7, Sodium 139, EGFR 60-69 04/10/2018 Creatinine1.22, BUN29, Potassium4.4, [id number], EGFR3-61 04/01/2018 Creatinine1.30, BUN31, Potassium4.3, Sodium135, [id number]  03/20/2018 Creatinine1.84, BUN55, Potassium4.5, Sodium131  03/07/2018 Creatinine1.69, BUN63, Potassium5.3, Sodium133, [id number]  02/23/2018 Creatinine1.39, BUN48, Potassium4.3, Sodium134, [id number]  02/22/2018 Creatinine1.24, BUN37, Potassium3.9, Sodium135, EGFR50-58  02/21/2018 Creatinine1.29, BUN35, Potassium3.5, [id number], [id number]  02/20/2018 Creatinine1.08, BUN28, Potassium3.7, [id number], EGFR60->60  02/19/2018 Creatinine1.05, BUN33, Potassium3.6, [id number]  02/18/2018 Creatinine1.24, BUN25, Potassium4.4, [id number], [id number]  02/17/2018 Creatinine1.12, BUN34, Potassium4.9, Sodium140 02/10/2018 Creatinine1.30, BUN45, Potassium4.8, Sodium139  01/27/2018 Creatinine1.30, BUN45, Potassium4.8, Sodium139  01/14/2018 Creatinine1.85, BUN80, Potassium4.8, Sodium 138  12/25/2017 Creatinine1.37,  BUN61, Potassium4.9, [id number]  12/16/2017 Creatinine1.36, BUN55, Potassium4.9, Sodium134 A complete set of results can be found in Results Review.  Recommendations:  Left voice mail with ICM number and encouraged to call if experiencing any fluid symptoms.  Follow-up plan: ICM clinic phone appointment on 10/16/2018.   Office appointment scheduled 10/21/2018 with Dr. [name].    Copy of ICM check sent to Dr. Lovena Le and Dr [name].   3 month ICM trend: 09/11/2018    1 Year ICM trend:       Rosalene Billings, RN 09/12/2018 9:01 AM

## 2018-09-12 NOTE — Progress Notes (Signed)
Remote pacemaker transmission.   

## 2018-09-12 NOTE — Telephone Encounter (Signed)
Remote ICM transmission received.  Attempted call to daughter Bradley Ferris and left detailed message, per DPR, regarding transmission and next ICM scheduled for 10/16/2018.  Advised to return call for any fluid symptoms or questions.

## 2018-09-20 LAB — CUP PACEART REMOTE DEVICE CHECK
Battery Remaining Longevity: 50 mo
Brady Statistic AP VS Percent: 0 %
Brady Statistic AS VP Percent: 0 %
Brady Statistic RA Percent Paced: 0 %
Date Time Interrogation Session: 20191004025202
Implantable Lead Implant Date: 20131105
Implantable Lead Implant Date: 20131105
Implantable Lead Location: 753858
Implantable Lead Location: 753859
Implantable Lead Model: 4296
Implantable Pulse Generator Implant Date: 20170818
Lead Channel Impedance Value: 323 Ohm
Lead Channel Impedance Value: 342 Ohm
Lead Channel Impedance Value: 475 Ohm
Lead Channel Impedance Value: 608 Ohm
Lead Channel Pacing Threshold Amplitude: 0.625 V
Lead Channel Pacing Threshold Pulse Width: 1.5 ms
Lead Channel Sensing Intrinsic Amplitude: 12.125 mV
Lead Channel Sensing Intrinsic Amplitude: 12.125 mV
Lead Channel Setting Pacing Amplitude: 2 V
Lead Channel Setting Pacing Amplitude: 2 V
Lead Channel Setting Pacing Pulse Width: 0.4 ms
Lead Channel Setting Pacing Pulse Width: 1.5 ms
Lead Channel Setting Sensing Sensitivity: 0.9 mV
MDC IDC LEAD IMPLANT DT: 20131105
MDC IDC LEAD LOCATION: 753860
MDC IDC MSMT BATTERY VOLTAGE: 3 V
MDC IDC MSMT LEADCHNL LV IMPEDANCE VALUE: 342 Ohm
MDC IDC MSMT LEADCHNL LV IMPEDANCE VALUE: 418 Ohm
MDC IDC MSMT LEADCHNL LV IMPEDANCE VALUE: 418 Ohm
MDC IDC MSMT LEADCHNL LV PACING THRESHOLD AMPLITUDE: 1 V
MDC IDC MSMT LEADCHNL RA IMPEDANCE VALUE: 437 Ohm
MDC IDC MSMT LEADCHNL RV IMPEDANCE VALUE: 304 Ohm
MDC IDC MSMT LEADCHNL RV PACING THRESHOLD PULSEWIDTH: 0.4 ms
MDC IDC STAT BRADY AP VP PERCENT: 0 %
MDC IDC STAT BRADY AS VS PERCENT: 0 %
MDC IDC STAT BRADY RV PERCENT PACED: 86.37 %

## 2018-09-23 ENCOUNTER — Encounter: Payer: Self-pay | Admitting: Endocrinology

## 2018-09-23 ENCOUNTER — Ambulatory Visit (INDEPENDENT_AMBULATORY_CARE_PROVIDER_SITE_OTHER): Payer: Medicare HMO | Admitting: Endocrinology

## 2018-09-23 VITALS — BP 132/80 | HR 88 | Ht 68.0 in | Wt 230.8 lb

## 2018-09-23 DIAGNOSIS — E1151 Type 2 diabetes mellitus with diabetic peripheral angiopathy without gangrene: Secondary | ICD-10-CM

## 2018-09-23 DIAGNOSIS — Z23 Encounter for immunization: Secondary | ICD-10-CM

## 2018-09-23 LAB — POCT GLYCOSYLATED HEMOGLOBIN (HGB A1C): Hemoglobin A1C: 6.3 % — AB (ref 4.0–5.6)

## 2018-09-23 NOTE — Patient Instructions (Addendum)
Please continue the same medications, including miralax.  Best wishes with your new primary care provider.

## 2018-09-23 NOTE — Progress Notes (Signed)
Subjective:    Patient ID: [name], male    DOB: 04/24/1929, 84 y.o.   MRN: [id number]  HPI Pt returns for f/u of diabetes mellitus:  DM type: 2.  Dx'ed: 2008.  Complications: renal insufficiency. Therapy: no medication now.   DKA: never.  Severe hypoglycemia: never.   Pancreatitis: never.   Interval history: no change in chronic leg edema.  He takes meds as rx'ed.   Past Medical History:  Diagnosis Date  . Anemia   . Atrial fibrillation (HCC)   . BPH (benign prostatic hyperplasia)   . Bradycardia   . Cardiomyopathy   . CHF (congestive heart failure) (HCC)   . Diabetes mellitus type II   . GERD (gastroesophageal reflux disease)   . History of colonoscopy   . HTN (hypertension)   . Hyperlipidemia   . ICD (implantable cardiac defibrillator) in place 10/14/2012   biventricular  . OA (osteoarthritis)   . Obesity   . Peptic ulcer     Past Surgical History:  Procedure Laterality Date  . EP IMPLANTABLE DEVICE Left   . EP IMPLANTABLE DEVICE N/A 07/27/2016   Procedure: BIV Pacemaker downgrade;  Surgeon: Marinus Maw, MD;  Location: [facility] INVASIVE CV [facility];  Service: Cardiovascular;  Laterality: N/A;  . ESOPHAGOGASTRODUODENOSCOPY  06/24/2002  . L-spine  1965  . Lumbar L4-5 & S1  02/2000  . TOTAL KNEE ARTHROPLASTY  1997   right  . TOTAL KNEE ARTHROPLASTY Left 02/15/2015   Procedure: LEFT TOTAL KNEE ARTHROPLASTY;  Surgeon: Durene Romans, MD;  Location: [facility];  Service: Orthopedics;  Laterality: Left;    Social History   Socioeconomic History  . Marital status: Widowed    Spouse name: Not on file  . Number of children: Not on file  . Years of education: Not on file  . Highest education level: Not on file  Occupational History  . Occupation: Retired  Engineer, production  . Financial resource strain: Not on file  . Food insecurity:    Worry: Not on file    Inability: Not on file  . Transportation needs:    Medical: Not on file    Non-medical: Not on file  Tobacco Use    . Smoking status: Never Smoker  . Smokeless tobacco: Never Used  Substance and Sexual Activity  . Alcohol use: No  . Drug use: No  . Sexual activity: Not on file  Lifestyle  . Physical activity:    Days per week: Not on file    Minutes per session: Not on file  . Stress: Not on file  Relationships  . Social connections:    Talks on phone: Not on file    Gets together: Not on file    Attends religious service: Not on file    Active member of club or organization: Not on file    Attends meetings of clubs or organizations: Not on file    Relationship status: Not on file  . Intimate partner violence:    Fear of current or ex partner: Not on file    Emotionally abused: Not on file    Physically abused: Not on file    Forced sexual activity: Not on file  Other Topics Concern  . Not on file  Social History Narrative  . Not on file    Current Outpatient Medications on File Prior to Visit  Medication Sig Dispense Refill  . acetaminophen (TYLENOL) 650 MG CR tablet Take 650-1,300 mg by mouth every 8 (eight)  hours as needed for pain.    [name] allopurinol (ZYLOPRIM) 100 MG tablet TAKE 1 TABLET EVERY DAY 90 tablet 0  . apixaban (ELIQUIS) 5 MG TABS tablet Take 1 tablet (5 mg total) by mouth 2 (two) times daily. 60 tablet 5  . atorvastatin (LIPITOR) 20 MG tablet TAKE 1 TABLET EVERY DAY (Patient taking differently: TAKE 1 TABLET (20mg ) EVERY DAY) 90 tablet 3  . cholecalciferol (VITAMIN D) 1000 units tablet Take 1,000 Units by mouth daily.    [name] donepezil (ARICEPT) 5 MG tablet Take 1 tablet (5 mg total) by mouth at bedtime. 90 tablet 3  . furosemide (LASIX) 40 MG tablet Take 1.5 tablets (60 mg total) every morning. 135 tablet 3  . lisinopril (PRINIVIL,ZESTRIL) 2.5 MG tablet Take 1 tablet (2.5 mg total) by mouth daily. 90 tablet 3  . magnesium oxide (MAG-OX) 400 (241.3 Mg) MG tablet TAKE 1 TABLET (400 MG TOTAL) BY MOUTH 2 (TWO) TIMES DAILY. 180 tablet 3  . polyethylene glycol (MIRALAX / GLYCOLAX)  packet Take 17 g by mouth daily as needed (constipation).    . potassium chloride SA (K-DUR,KLOR-CON) 20 MEQ tablet Take 1 tablet (20 mEq total) by mouth daily. 90 tablet 3  . tamsulosin (FLOMAX) 0.4 MG CAPS capsule Take 1 capsule (0.4 mg total) by mouth 2 (two) times daily. 180 capsule 2   No current facility-administered medications on file prior to visit.     No Known Allergies  Family History  Problem Relation Age of Onset  . Heart disease Father   . Heart disease Mother   . Diabetes Mother   . Hypertension Brother   . Hypertension Brother   . Cancer Neg Hx     BP 132/80 (BP Location: Right Arm, Patient Position: Sitting, Cuff Size: Normal)   Pulse 88   Ht 5\' 8"  (1.727 m)   Wt 230 lb 12.8 oz (104.7 kg)   SpO2 94%   BMI 35.09 kg/m    Review of Systems Denies sob.  Constipation is somewhat improved on miralax.      Objective:   Physical Exam VITAL SIGNS:  See vs page.   GENERAL: no distress.  Pulses: foot pulses are intact bilaterally.   MSK: no deformity of the feet or ankles.  CV: 2+ bilat edema of the legs, and severe bilat vv's.   Skin: severe bilat rust discoloration of the legs. Heavy calluses on the feet. no ulcer on the feet.  feet and legs are of normal temp, but are slightly cyanotic.   Neuro: sensation is intact to touch on the feet and ankles.   Ext: There is bilateral onychomycosis of the toenails.    Lab Results  Component Value Date   CREATININE 0.89 07/15/2018   BUN 19 07/15/2018   NA 141 07/15/2018   K 4.1 07/15/2018   CL 99 07/15/2018   CO2 25 07/15/2018   Lab Results  Component Value Date   HGBA1C 6.3 (A) 09/23/2018       Assessment & Plan:  Constipation: persistent: we discussed.  He declines to change to lactulose.  type 2 DM, with renal insuff: well-controlled.  Edema: this limits rx options.    Patient Instructions  Please continue the same medications, including miralax.  Best wishes with your new primary care provider.

## 2018-10-06 ENCOUNTER — Telehealth: Payer: Self-pay | Admitting: Endocrinology

## 2018-10-06 NOTE — Telephone Encounter (Signed)
Documents placed on Dr. George Hugh desk for completion.

## 2018-10-06 NOTE — Telephone Encounter (Signed)
[name] patient's daughter drooped off [facility] forms to be filled out by Dr. Everardo All. Please call [name] at ph# [phone] when forms are ready for her to pick up. [name] would like to pick them up this week either Wednesday or Thursday. Forms have been put into Dr. George Hugh outgoing box.

## 2018-10-07 ENCOUNTER — Ambulatory Visit: Payer: Medicare HMO | Admitting: Podiatry

## 2018-10-09 NOTE — Progress Notes (Deleted)
HPI: FU nonischemic cardiomyopathy as well as atrial fibrillation. Note, he had a catheterization in January 2006 that showed no coronary artery disease and an ejection fraction of 40%. He had a Myoview performed last on May 04, 2008, that showed an ejection fraction of 31%. There was felt to be a prior inferior infarct with mild peri-infarct ischemia. I did review this and felt there was a low risk and we have been treating medically. He does have permanent atrial fibrillation as well. Holter monitor in August of 2011 showed mildly reduced rate and we decreased his Coreg. In November of 2013 the patient had a biventricular ICD placed; downgraded to BIV pacemaker 8/17. Echocardiogram March 2019 showed ejection fraction 40 to 45%, moderate left ventricular hypertrophy, moderate left ventricular enlargement, mild to moderate mitral regurgitation, severe biatrial enlargement. Since I last saw him,  Current Outpatient Medications  Medication Sig Dispense Refill  . acetaminophen (TYLENOL) 650 MG CR tablet Take 650-1,300 mg by mouth every 8 (eight) hours as needed for pain.    [name] allopurinol (ZYLOPRIM) 100 MG tablet TAKE 1 TABLET EVERY DAY 90 tablet 0  . apixaban (ELIQUIS) 5 MG TABS tablet Take 1 tablet (5 mg total) by mouth 2 (two) times daily. 60 tablet 5  . atorvastatin (LIPITOR) 20 MG tablet TAKE 1 TABLET EVERY DAY (Patient taking differently: TAKE 1 TABLET (20mg ) EVERY DAY) 90 tablet 3  . cholecalciferol (VITAMIN D) 1000 units tablet Take 1,000 Units by mouth daily.    [name] donepezil (ARICEPT) 5 MG tablet Take 1 tablet (5 mg total) by mouth at bedtime. 90 tablet 3  . furosemide (LASIX) 40 MG tablet Take 1.5 tablets (60 mg total) every morning. 135 tablet 3  . lisinopril (PRINIVIL,ZESTRIL) 2.5 MG tablet Take 1 tablet (2.5 mg total) by mouth daily. 90 tablet 3  . magnesium oxide (MAG-OX) 400 (241.3 Mg) MG tablet TAKE 1 TABLET (400 MG TOTAL) BY MOUTH 2 (TWO) TIMES DAILY. 180 tablet 3  . polyethylene  glycol (MIRALAX / GLYCOLAX) packet Take 17 g by mouth daily as needed (constipation).    . potassium chloride SA (K-DUR,KLOR-CON) 20 MEQ tablet Take 1 tablet (20 mEq total) by mouth daily. 90 tablet 3  . tamsulosin (FLOMAX) 0.4 MG CAPS capsule Take 1 capsule (0.4 mg total) by mouth 2 (two) times daily. 180 capsule 2   No current facility-administered medications for this visit.      Past Medical History:  Diagnosis Date  . Anemia   . Atrial fibrillation (HCC)   . BPH (benign prostatic hyperplasia)   . Bradycardia   . Cardiomyopathy   . CHF (congestive heart failure) (HCC)   . Diabetes mellitus type II   . GERD (gastroesophageal reflux disease)   . History of colonoscopy   . HTN (hypertension)   . Hyperlipidemia   . ICD (implantable cardiac defibrillator) in place 10/14/2012   biventricular  . OA (osteoarthritis)   . Obesity   . Peptic ulcer     Past Surgical History:  Procedure Laterality Date  . EP IMPLANTABLE DEVICE Left   . EP IMPLANTABLE DEVICE N/A 07/27/2016   Procedure: BIV Pacemaker downgrade;  Surgeon: Marinus Maw, MD;  Location: [facility] INVASIVE CV LAB;  Service: Cardiovascular;  Laterality: N/A;  . ESOPHAGOGASTRODUODENOSCOPY  06/24/2002  . L-spine  1965  . Lumbar L4-5 & S1  02/2000  . TOTAL KNEE ARTHROPLASTY  1997   right  . TOTAL KNEE ARTHROPLASTY Left 02/15/2015   Procedure: LEFT  TOTAL KNEE ARTHROPLASTY;  Surgeon: Durene Romans, MD;  Location: [facility];  Service: Orthopedics;  Laterality: Left;    Social History   Socioeconomic History  . Marital status: Widowed    Spouse name: Not on file  . Number of children: Not on file  . Years of education: Not on file  . Highest education level: Not on file  Occupational History  . Occupation: Retired  Engineer, production  . Financial resource strain: Not on file  . Food insecurity:    Worry: Not on file    Inability: Not on file  . Transportation needs:    Medical: Not on file    Non-medical: Not on file  Tobacco Use  .  Smoking status: Never Smoker  . Smokeless tobacco: Never Used  Substance and Sexual Activity  . Alcohol use: No  . Drug use: No  . Sexual activity: Not on file  Lifestyle  . Physical activity:    Days per week: Not on file    Minutes per session: Not on file  . Stress: Not on file  Relationships  . Social connections:    Talks on phone: Not on file    Gets together: Not on file    Attends religious service: Not on file    Active member of club or organization: Not on file    Attends meetings of clubs or organizations: Not on file    Relationship status: Not on file  . Intimate partner violence:    Fear of current or ex partner: Not on file    Emotionally abused: Not on file    Physically abused: Not on file    Forced sexual activity: Not on file  Other Topics Concern  . Not on file  Social History Narrative  . Not on file    Family History  Problem Relation Age of Onset  . Heart disease Father   . Heart disease Mother   . Diabetes Mother   . Hypertension Brother   . Hypertension Brother   . Cancer Neg Hx     ROS: no fevers or chills, productive cough, hemoptysis, dysphasia, odynophagia, melena, hematochezia, dysuria, hematuria, rash, seizure activity, orthopnea, PND, pedal edema, claudication. Remaining systems are negative.  Physical Exam: Well-developed well-nourished in no acute distress.  Skin is warm and dry.  HEENT is normal.  Neck is supple.  Chest is clear to auscultation with normal expansion.  Cardiovascular exam is regular rate and rhythm.  Abdominal exam nontender or distended. No masses palpated. Extremities show no edema. neuro grossly intact  ECG- personally reviewed  A/P  1  Olga Millers, MD

## 2018-10-13 ENCOUNTER — Encounter: Payer: Self-pay | Admitting: Endocrinology

## 2018-10-13 ENCOUNTER — Ambulatory Visit (INDEPENDENT_AMBULATORY_CARE_PROVIDER_SITE_OTHER): Payer: Medicare HMO | Admitting: Endocrinology

## 2018-10-13 VITALS — BP 144/80 | HR 89 | Ht 68.0 in | Wt 230.0 lb

## 2018-10-13 DIAGNOSIS — M1712 Unilateral primary osteoarthritis, left knee: Secondary | ICD-10-CM | POA: Diagnosis not present

## 2018-10-13 NOTE — Progress Notes (Signed)
Subjective:    Patient ID: [name], male    DOB: 01/20/1929, 84 y.o.   MRN: [id number]  HPI Pt reports few years of moderate pain at the lower back.  He has assoc pain at the knees.  He also has pain at both shoulders.   Past Medical History:  Diagnosis Date  . Anemia   . Atrial fibrillation (HCC)   . BPH (benign prostatic hyperplasia)   . Bradycardia   . Cardiomyopathy   . CHF (congestive heart failure) (HCC)   . Diabetes mellitus type II   . GERD (gastroesophageal reflux disease)   . History of colonoscopy   . HTN (hypertension)   . Hyperlipidemia   . ICD (implantable cardiac defibrillator) in place 10/14/2012   biventricular  . OA (osteoarthritis)   . Obesity   . Peptic ulcer     Past Surgical History:  Procedure Laterality Date  . EP IMPLANTABLE DEVICE Left   . EP IMPLANTABLE DEVICE N/A 07/27/2016   Procedure: BIV Pacemaker downgrade;  Surgeon: Marinus Maw, MD;  Location: [facility] INVASIVE CV LAB;  Service: Cardiovascular;  Laterality: N/A;  . ESOPHAGOGASTRODUODENOSCOPY  06/24/2002  . L-spine  1965  . Lumbar L4-5 & S1  02/2000  . TOTAL KNEE ARTHROPLASTY  1997   right  . TOTAL KNEE ARTHROPLASTY Left 02/15/2015   Procedure: LEFT TOTAL KNEE ARTHROPLASTY;  Surgeon: Durene Romans, MD;  Location: [facility];  Service: Orthopedics;  Laterality: Left;    Social History   Socioeconomic History  . Marital status: Widowed    Spouse name: Not on file  . Number of children: Not on file  . Years of education: Not on file  . Highest education level: Not on file  Occupational History  . Occupation: Retired  Engineer, production  . Financial resource strain: Not on file  . Food insecurity:    Worry: Not on file    Inability: Not on file  . Transportation needs:    Medical: Not on file    Non-medical: Not on file  Tobacco Use  . Smoking status: Never Smoker  . Smokeless tobacco: Never Used  Substance and Sexual Activity  . Alcohol use: No  . Drug use: No  . Sexual activity:  Not on file  Lifestyle  . Physical activity:    Days per week: Not on file    Minutes per session: Not on file  . Stress: Not on file  Relationships  . Social connections:    Talks on phone: Not on file    Gets together: Not on file    Attends religious service: Not on file    Active member of club or organization: Not on file    Attends meetings of clubs or organizations: Not on file    Relationship status: Not on file  . Intimate partner violence:    Fear of current or ex partner: Not on file    Emotionally abused: Not on file    Physically abused: Not on file    Forced sexual activity: Not on file  Other Topics Concern  . Not on file  Social History Narrative  . Not on file    Current Outpatient Medications on File Prior to Visit  Medication Sig Dispense Refill  . acetaminophen (TYLENOL) 650 MG CR tablet Take 650-1,300 mg by mouth every 8 (eight) hours as needed for pain.    [name] allopurinol (ZYLOPRIM) 100 MG tablet TAKE 1 TABLET EVERY DAY 90 tablet 0  .  apixaban (ELIQUIS) 5 MG TABS tablet Take 1 tablet (5 mg total) by mouth 2 (two) times daily. 60 tablet 5  . atorvastatin (LIPITOR) 20 MG tablet TAKE 1 TABLET EVERY DAY (Patient taking differently: TAKE 1 TABLET (20mg ) EVERY DAY) 90 tablet 3  . cholecalciferol (VITAMIN D) 1000 units tablet Take 1,000 Units by mouth daily.    [name] donepezil (ARICEPT) 5 MG tablet Take 1 tablet (5 mg total) by mouth at bedtime. 90 tablet 3  . furosemide (LASIX) 40 MG tablet Take 1.5 tablets (60 mg total) every morning. 135 tablet 3  . lisinopril (PRINIVIL,ZESTRIL) 2.5 MG tablet Take 1 tablet (2.5 mg total) by mouth daily. 90 tablet 3  . magnesium oxide (MAG-OX) 400 (241.3 Mg) MG tablet TAKE 1 TABLET (400 MG TOTAL) BY MOUTH 2 (TWO) TIMES DAILY. 180 tablet 3  . polyethylene glycol (MIRALAX / GLYCOLAX) packet Take 17 g by mouth daily as needed (constipation).    . potassium chloride SA (K-DUR,KLOR-CON) 20 MEQ tablet Take 1 tablet (20 mEq total) by mouth  daily. 90 tablet 3  . tamsulosin (FLOMAX) 0.4 MG CAPS capsule Take 1 capsule (0.4 mg total) by mouth 2 (two) times daily. 180 capsule 2   No current facility-administered medications on file prior to visit.     No Known Allergies  Family History  Problem Relation Age of Onset  . Heart disease Father   . Heart disease Mother   . Diabetes Mother   . Hypertension Brother   . Hypertension Brother   . Cancer Neg Hx     BP (!) 144/80 (BP Location: Left Arm, Patient Position: Sitting, Cuff Size: Normal)   Pulse 89   Ht 5\' 8"  (1.727 m)   Wt 230 lb (104.3 kg)   SpO2 96%   BMI 34.97 kg/m    Review of Systems He has memory loss.  He has doe with slight exertion.      Objective:   Physical Exam VITAL SIGNS:  See vs page GENERAL: no distress LUNGS:  Clear to auscultation Gait: slow but steady, with a cane.  He cannot stand without the cane.   PSYCH: cooperative.  Alert.  Oriented to self, place, and 10/12/18.  However, he recalls with difficulty.       Assessment & Plan:  Back pain, chronic.  Gait difficulty: unchanged.  Memory loss: chronic   Patient Instructions  Please continue the same medications. I did the form for you today Best wishes with your new primary care provider.

## 2018-10-13 NOTE — Patient Instructions (Signed)
Please continue the same medications. I did the form for you today Best wishes with your new primary care provider.

## 2018-10-16 ENCOUNTER — Ambulatory Visit (INDEPENDENT_AMBULATORY_CARE_PROVIDER_SITE_OTHER): Payer: Medicare HMO

## 2018-10-16 ENCOUNTER — Telehealth: Payer: Self-pay

## 2018-10-16 DIAGNOSIS — I5042 Chronic combined systolic (congestive) and diastolic (congestive) heart failure: Secondary | ICD-10-CM

## 2018-10-16 DIAGNOSIS — Z95810 Presence of automatic (implantable) cardiac defibrillator: Secondary | ICD-10-CM

## 2018-10-16 NOTE — Telephone Encounter (Signed)
LMOVM reminding pt to send remote transmission.   

## 2018-10-17 NOTE — Progress Notes (Signed)
EPIC Encounter for ICM Monitoring  Patient Name: [name] is a 84 y.o. male Date: 10/17/2018 Primary Care Physican: Renato Shin, MD Primary Cardiologist:Crenshaw Electrophysiologist:Taylor BiV Pacing: 88.8%   Last Weight: 231lbs Today's Weight:  unknown      Attempted call to daughter, Trinidad Curet.  Transmission reviewed. Left detailed message regarding transmission   Thoracic impedance remains abnormal suggesting fluid accumulation starting 03/24/2018.  Fluid index remains >200  Prescribed: Furosemide40 mgTake 1.5tabletsonce daily. Per Dr Jacalyn Lefevre office visit note 8/6, he cantake an additional 40 mg of Lasix for weight gain of 2 to 3 pounds.Potassium 20 mEq 1tablet daily.  Labs: 07/15/2018 Creatinine 0.89, BUN 19, Potassium 4.1, Sodium 141, EGFR 77-89 05/15/2018 Creatinine 0.96, BUN 19, Potassium 4.3, Sodium 139, EGFR 71-82 04/29/2018 Creatinine 1.10, BUN 18, Potassium 3.7, Sodium 139, EGFR 60-69 04/10/2018 Creatinine1.22, BUN29, Potassium4.4, [id number], EGFR3-61 04/01/2018 Creatinine1.30, BUN31, Potassium4.3, Sodium135, [id number]  03/20/2018 Creatinine1.84, BUN55, Potassium4.5, Sodium131  A complete set of results can be found in Results Review.  Recommendations:   Left voice mail with ICM number and encouraged to call if experiencing any fluid symptoms.  Follow-up plan: ICM clinic phone appointment on 11/18/2018.   Office appointment scheduled 12/22/2018 with Dr. Lovena Le.    Copy of ICM check sent to Dr. Lovena Le and Dr [name].   3 month ICM trend: 10/16/2018    1 Year ICM trend:       Rosalene Billings, RN 10/17/2018 10:16 AM

## 2018-10-21 ENCOUNTER — Ambulatory Visit: Payer: Medicare HMO | Admitting: Cardiology

## 2018-10-22 ENCOUNTER — Other Ambulatory Visit: Payer: Self-pay | Admitting: *Deleted

## 2018-10-22 ENCOUNTER — Other Ambulatory Visit: Payer: Self-pay | Admitting: Cardiology

## 2018-10-22 MED ORDER — APIXABAN 5 MG PO TABS: 5.0000 mg | ORAL_TABLET | Freq: Two times a day (BID) | ORAL | 9 refills | Status: DC

## 2018-10-22 NOTE — Telephone Encounter (Signed)
°*  STAT* If patient is at the pharmacy, call can be transferred to refill team.   1. Which medications need to be refilled? (please list name of each medication and dose if known) Eliquis 5 mg  2. Which pharmacy/location (including street and city if local pharmacy) is medication to be sent to? [facility] [address] [employer] [employer] [address]  3. Do they need a 30 day or 90 day supply? 30

## 2018-10-22 NOTE — Telephone Encounter (Signed)
Pt is a 84 yr old male. He saw Dr Jens Som on 07/15/18, SCr at that visit was 0.89. Last weight was 104.3Kg. Eliquis 5mg  will be refilled.

## 2018-10-23 MED ORDER — APIXABAN 5 MG PO TABS: 5.0000 mg | ORAL_TABLET | Freq: Two times a day (BID) | ORAL | 3 refills | Status: DC

## 2018-10-23 NOTE — Telephone Encounter (Signed)
Eliquis Rx sent to requested pharmacy

## 2018-10-27 MED ORDER — ATORVASTATIN CALCIUM 20 MG PO TABS: 20.0000 mg | ORAL_TABLET | Freq: Every day | ORAL | 0 refills | Status: DC

## 2018-11-18 ENCOUNTER — Telehealth: Payer: Self-pay | Admitting: Cardiology

## 2018-11-18 ENCOUNTER — Ambulatory Visit (INDEPENDENT_AMBULATORY_CARE_PROVIDER_SITE_OTHER): Payer: Medicare HMO

## 2018-11-18 DIAGNOSIS — Z95810 Presence of automatic (implantable) cardiac defibrillator: Secondary | ICD-10-CM

## 2018-11-18 DIAGNOSIS — I5042 Chronic combined systolic (congestive) and diastolic (congestive) heart failure: Secondary | ICD-10-CM | POA: Diagnosis not present

## 2018-11-18 NOTE — Telephone Encounter (Signed)
LMOVM reminding pt to send remote transmission.   

## 2018-11-20 NOTE — Progress Notes (Signed)
EPIC Encounter for ICM Monitoring  Patient Name: [name] is a 84 y.o. male Date: 11/20/2018 Primary Care Physican: No primary care provider on file. Primary Cardiologist:Crenshaw Electrophysiologist:Taylor BiV Pacing: 88.8% Last Weight: 231lbs Today's Weight:  unknown                                                          Transmission reviewed.    Thoracic impedance remains abnormal suggesting fluid accumulation starting 03/24/2018.  Fluid index remains >200  Prescribed: Furosemide40 mgTake 1.5tabletsonce daily. Per Dr Jacalyn Lefevre office visit note 8/6, he cantake an additional 40 mg of Lasix for weight gain of 2 to 3 pounds.Potassium 20 mEq 1tablet daily.  Labs: 07/15/2018 Creatinine 0.89, BUN 19, Potassium 4.1, Sodium 141, EGFR 77-89 05/15/2018 Creatinine 0.96, BUN 19, Potassium 4.3, Sodium 139, EGFR 71-82 04/29/2018 Creatinine 1.10, BUN 18, Potassium 3.7, Sodium 139, EGFR 60-69 04/10/2018 Creatinine1.22, BUN29, Potassium4.4, [id number], EGFR3-61 04/01/2018 Creatinine1.30, BUN31, Potassium4.3, Sodium135, [id number]  03/20/2018 Creatinine1.84, BUN55, Potassium4.5, Sodium131  A complete set of results can be found in Results Review.  Recommendations:   None but will call if any recommendations.   Follow-up plan: ICM clinic phone appointment on 01/23/2019.   Office appointment scheduled 12/22/2018 with Dr. Lovena Le and 12/30/2017 with Dr [name].    Copy of ICM check sent to Dr. Lovena Le and Dr [name].   3 month ICM trend: 11/18/2018    1 Year ICM trend:       Rosalene Billings, RN 11/20/2018 4:02 PM

## 2018-11-27 NOTE — Progress Notes (Signed)
Returned daughter's call as requested by voice mail message.  Advised transmission remains unchanged since April and if is having any symptoms to return call.  Advised scheduled next transmission for 01/23/2019 since Dr Ladona Ridgel will check his device at January office appointment.  Encouraged to call back if patient is having any symptoms.

## 2018-12-09 ENCOUNTER — Telehealth: Payer: Self-pay

## 2018-12-09 NOTE — Telephone Encounter (Signed)
Attempted return call to daughter as requested by voice mail message regarding if a remote appointment was needed between defib office check 12/23/2018 with Dr Ladona Ridgel and next home remote 01/23/2019.  Left message no further appointments are needed unless patient's condition changes.  Advised it is normal process to wait 31 days after office visit before scheduling next remote transmission. Advised to call if patient has any changes.

## 2018-12-12 ENCOUNTER — Encounter: Payer: Self-pay | Admitting: Cardiology

## 2018-12-22 ENCOUNTER — Encounter: Payer: Self-pay | Admitting: Internal Medicine

## 2018-12-22 ENCOUNTER — Ambulatory Visit (INDEPENDENT_AMBULATORY_CARE_PROVIDER_SITE_OTHER): Payer: Medicare HMO | Admitting: Internal Medicine

## 2018-12-22 VITALS — BP 132/78 | HR 83 | Ht 68.0 in | Wt 225.0 lb

## 2018-12-22 DIAGNOSIS — I5022 Chronic systolic (congestive) heart failure: Secondary | ICD-10-CM

## 2018-12-22 DIAGNOSIS — I482 Chronic atrial fibrillation, unspecified: Secondary | ICD-10-CM | POA: Diagnosis not present

## 2018-12-22 DIAGNOSIS — Z950 Presence of cardiac pacemaker: Secondary | ICD-10-CM | POA: Diagnosis not present

## 2018-12-22 NOTE — Progress Notes (Signed)
HPI Mr. Layden returns today for evaluation of ventricular ectopy. He is a pleasant 85 yo man with chronic systolic heart failure, renal insufficiency, class 3, HTN, and chronic atrial fib. He has been stable. He only complains of bad dreams. He admits to being very sedentary. No Known Allergies   Current Outpatient Medications  Medication Sig Dispense Refill  . acetaminophen (TYLENOL) 650 MG CR tablet Take 650-1,300 mg by mouth every 8 (eight) hours as needed for pain.    [name] allopurinol (ZYLOPRIM) 100 MG tablet TAKE 1 TABLET EVERY DAY 90 tablet 0  . apixaban (ELIQUIS) 5 MG TABS tablet Take 1 tablet (5 mg total) by mouth 2 (two) times daily. 60 tablet 3  . atorvastatin (LIPITOR) 20 MG tablet Take 1 tablet (20 mg total) by mouth at bedtime. 90 tablet 0  . cholecalciferol (VITAMIN D) 1000 units tablet Take 1,000 Units by mouth daily.    [name] donepezil (ARICEPT) 5 MG tablet Take 1 tablet (5 mg total) by mouth at bedtime. 90 tablet 3  . furosemide (LASIX) 40 MG tablet Take 1.5 tablets (60 mg total) every morning. 135 tablet 3  . magnesium oxide (MAG-OX) 400 (241.3 Mg) MG tablet TAKE 1 TABLET (400 MG TOTAL) BY MOUTH 2 (TWO) TIMES DAILY. 180 tablet 3  . polyethylene glycol (MIRALAX / GLYCOLAX) packet Take 17 g by mouth daily as needed (constipation).    . potassium chloride SA (K-DUR,KLOR-CON) 20 MEQ tablet Take 1 tablet (20 mEq total) by mouth daily. 90 tablet 3  . tamsulosin (FLOMAX) 0.4 MG CAPS capsule Take 1 capsule (0.4 mg total) by mouth 2 (two) times daily. 180 capsule 2  . lisinopril (PRINIVIL,ZESTRIL) 2.5 MG tablet Take 1 tablet (2.5 mg total) by mouth daily. 90 tablet 3   No current facility-administered medications for this visit.      Past Medical History:  Diagnosis Date  . Anemia   . Atrial fibrillation (HCC)   . BPH (benign prostatic hyperplasia)   . Bradycardia   . Cardiomyopathy   . CHF (congestive heart failure) (HCC)   . Diabetes mellitus type II   . GERD  (gastroesophageal reflux disease)   . History of colonoscopy   . HTN (hypertension)   . Hyperlipidemia   . ICD (implantable cardiac defibrillator) in place 10/14/2012   biventricular  . OA (osteoarthritis)   . Obesity   . Peptic ulcer     ROS:   All systems reviewed and negative except as noted in the HPI.   Past Surgical History:  Procedure Laterality Date  . EP IMPLANTABLE DEVICE Left   . EP IMPLANTABLE DEVICE N/A 07/27/2016   Procedure: BIV Pacemaker downgrade;  Surgeon: Marinus Maw, MD;  Location: [facility] INVASIVE CV LAB;  Service: Cardiovascular;  Laterality: N/A;  . ESOPHAGOGASTRODUODENOSCOPY  06/24/2002  . L-spine  1965  . Lumbar L4-5 & S1  02/2000  . TOTAL KNEE ARTHROPLASTY  1997   right  . TOTAL KNEE ARTHROPLASTY Left 02/15/2015   Procedure: LEFT TOTAL KNEE ARTHROPLASTY;  Surgeon: Durene Romans, MD;  Location: [facility];  Service: Orthopedics;  Laterality: Left;     Family History  Problem Relation Age of Onset  . Heart disease Father   . Heart disease Mother   . Diabetes Mother   . Hypertension Brother   . Hypertension Brother   . Cancer Neg Hx      Social History   Socioeconomic History  . Marital status: Widowed    Spouse  name: Not on file  . Number of children: Not on file  . Years of education: Not on file  . Highest education level: Not on file  Occupational History  . Occupation: Retired  Engineer, production  . Financial resource strain: Not on file  . Food insecurity:    Worry: Not on file    Inability: Not on file  . Transportation needs:    Medical: Not on file    Non-medical: Not on file  Tobacco Use  . Smoking status: Never Smoker  . Smokeless tobacco: Never Used  Substance and Sexual Activity  . Alcohol use: No  . Drug use: No  . Sexual activity: Not on file  Lifestyle  . Physical activity:    Days per week: Not on file    Minutes per session: Not on file  . Stress: Not on file  Relationships  . Social connections:    Talks on phone: Not  on file    Gets together: Not on file    Attends religious service: Not on file    Active member of club or organization: Not on file    Attends meetings of clubs or organizations: Not on file    Relationship status: Not on file  . Intimate partner violence:    Fear of current or ex partner: Not on file    Emotionally abused: Not on file    Physically abused: Not on file    Forced sexual activity: Not on file  Other Topics Concern  . Not on file  Social History Narrative  . Not on file     BP 132/78   Pulse 83   Ht 5\' 8"  (1.727 m)   Wt 225 lb (102.1 kg)   SpO2 98%   BMI 34.21 kg/m   Physical Exam:  Well appearing 85 yo man, NAD HEENT: Unremarkable Neck:  6 cm JVD, no thyromegally Lymphatics:  No adenopathy Back:  No CVA tenderness Lungs:  Clear with no wheezes HEART:  Regular rate rhythm, no murmurs, no rubs, no clicks Abd:  soft, positive bowel sounds, no organomegally, no rebound, no guarding Ext:  2 plus pulses, no edema, no cyanosis, no clubbing Skin:  No rashes no nodules Neuro:  CN II through XII intact, motor grossly intact  EKG - none  DEVICE  Normal device function.  See PaceArt for details.   Assess/Plan: 1. PVC' s- he has about 13% burden. He does not experience palpitations.  2.Chronic systolic heart failure- his optivol is up and has been up. I have asked him to take an extra lasix for 3 days. 3. ICD - his Medtronic biv ICD is working normally. 4. Atrial fib - his rates are well controlled. He will continue his eliquis.  [name].D.

## 2018-12-22 NOTE — Patient Instructions (Addendum)
Medication Instructions:  Your physician has recommended you make the following change in your medication:   1.  Increase your Lasix 40 mg---Take 2 tablets by mouth twice a day for 3 days.  Then return to your normal lasix schedule.   Labwork: None ordered.  Testing/Procedures: None ordered.  Follow-Up: Your physician wants you to follow-up in: one year with Dr. Ladona Ridgel.   You will receive a reminder letter in the mail two months in advance. If you don't receive a letter, please call our office to schedule the follow-up appointment.  Remote monitoring is used to monitor your Pacemaker from home. This monitoring reduces the number of office visits required to check your device to one time per year. It allows [place] to keep an eye on the functioning of your device to ensure it is working properly. You are scheduled for a device check from home on 03/23/2019. You may send your transmission at any time that day. If you have a wireless device, the transmission will be sent automatically. After your physician reviews your transmission, you will receive a postcard with your next transmission date.  Any Other Special Instructions Will Be Listed Below (If Applicable).  If you need a refill on your cardiac medications before your next appointment, please call your pharmacy.

## 2018-12-24 ENCOUNTER — Ambulatory Visit: Payer: Medicare HMO | Admitting: Podiatry

## 2018-12-24 ENCOUNTER — Encounter: Payer: Self-pay | Admitting: Podiatry

## 2018-12-24 DIAGNOSIS — M79674 Pain in right toe(s): Secondary | ICD-10-CM | POA: Diagnosis not present

## 2018-12-24 DIAGNOSIS — D689 Coagulation defect, unspecified: Secondary | ICD-10-CM

## 2018-12-24 DIAGNOSIS — M79675 Pain in left toe(s): Secondary | ICD-10-CM

## 2018-12-24 DIAGNOSIS — E119 Type 2 diabetes mellitus without complications: Secondary | ICD-10-CM | POA: Diagnosis not present

## 2018-12-24 DIAGNOSIS — B351 Tinea unguium: Secondary | ICD-10-CM | POA: Diagnosis not present

## 2018-12-24 NOTE — Progress Notes (Signed)
Complaint:  Visit Type: Patient returns to my office for continued preventative foot care services. Complaint: Patient states" my nails have grown long and thick and become painful to walk and wear shoes" Patient has been diagnosed with DM with no foot complications. The patient presents for preventative foot care services. No changes to ROS.  Patient is taking coumadin.  Podiatric Exam: Vascular: dorsalis pedis and posterior tibial pulses are palpable bilateral. Capillary return is immediate. Temperature gradient is WNL. Skin turgor WNL  Sensorium: Normal Semmes Weinstein monofilament test. Normal tactile sensation bilaterally. Nail Exam: Pt has thick disfigured discolored nails with subungual debris noted bilateral entire nail hallux through fifth toenails Ulcer Exam: There is no evidence of ulcer or pre-ulcerative changes or infection. Orthopedic Exam: Muscle tone and strength are WNL. No limitations in general ROM. No crepitus or effusions noted. Foot type and digits show no abnormalities. Bony prominences are unremarkable. Skin: Asymptomatic  Porokeratosis  Sub 1 sub heel right foot.. No infection or ulcers  Diagnosis:  Onychomycosis, , Pain in right toe, pain in left toes  Treatment & Plan Procedures and Treatment: Consent by patient was obtained for treatment procedures.   Debridement of mycotic and hypertrophic toenails, 1 through 5 bilateral and clearing of subungual debris. No ulceration, no infection noted.  Return Visit-Office Procedure: Patient instructed to return to the office for a follow up visit 3 months for continued evaluation and treatment.    Helane Gunther DPM

## 2018-12-26 NOTE — Progress Notes (Signed)
HPI: FU nonischemic cardiomyopathy as well as atrial fibrillation. Note, he had a catheterization in January 2006 that showed no coronary artery disease and an ejection fraction of 40%. He had a Myoview performed last on May 04, 2008, that showed an ejection fraction of 31%. There was felt to be a prior inferior infarct with mild peri-infarct ischemia. I did review this and felt there was a low risk and we have been treating medically. He does have permanent atrial fibrillation as well. Holter monitor in August of 2011 showed mildly reduced rate and we decreased his Coreg. In November of 2013 the patient had a biventricular ICD placed; downgraded to BIV pacemaker 8/17. Echocardiogram March 2019 showed ejection fraction 40 to 45%, moderate left ventricular hypertrophy, moderate left ventricular enlargement, mild to moderate mitral regurgitation, severe biatrial enlargement. Since I last saw him,no dyspnea, chest pain, palpitations or syncope.  Mild pedal edema which is chronic.  Current Outpatient Medications  Medication Sig Dispense Refill  . acetaminophen (TYLENOL) 650 MG CR tablet Take 650-1,300 mg by mouth every 8 (eight) hours as needed for pain.    [name] allopurinol (ZYLOPRIM) 100 MG tablet TAKE 1 TABLET EVERY DAY 90 tablet 0  . apixaban (ELIQUIS) 5 MG TABS tablet Take 1 tablet (5 mg total) by mouth 2 (two) times daily. 60 tablet 3  . atorvastatin (LIPITOR) 20 MG tablet Take 1 tablet (20 mg total) by mouth at bedtime. 90 tablet 0  . cholecalciferol (VITAMIN D) 1000 units tablet Take 1,000 Units by mouth daily.    [name] donepezil (ARICEPT) 5 MG tablet Take 1 tablet (5 mg total) by mouth at bedtime. 90 tablet 3  . furosemide (LASIX) 40 MG tablet Take 80 mg by mouth daily.    [name] lisinopril (PRINIVIL,ZESTRIL) 2.5 MG tablet Take 1 tablet (2.5 mg total) by mouth daily. 90 tablet 3  . magnesium oxide (MAG-OX) 400 (241.3 Mg) MG tablet TAKE 1 TABLET (400 MG TOTAL) BY MOUTH 2 (TWO) TIMES DAILY. 180 tablet  3  . polyethylene glycol (MIRALAX / GLYCOLAX) packet Take 17 g by mouth daily as needed (constipation).    . potassium chloride SA (K-DUR,KLOR-CON) 20 MEQ tablet Take 1 tablet (20 mEq total) by mouth daily. 90 tablet 3  . tamsulosin (FLOMAX) 0.4 MG CAPS capsule Take 1 capsule (0.4 mg total) by mouth 2 (two) times daily. 180 capsule 2   No current facility-administered medications for this visit.      Past Medical History:  Diagnosis Date  . Anemia   . Atrial fibrillation (HCC)   . BPH (benign prostatic hyperplasia)   . Bradycardia   . Cardiomyopathy   . CHF (congestive heart failure) (HCC)   . Diabetes mellitus type II   . GERD (gastroesophageal reflux disease)   . History of colonoscopy   . HTN (hypertension)   . Hyperlipidemia   . ICD (implantable cardiac defibrillator) in place 10/14/2012   biventricular  . OA (osteoarthritis)   . Obesity   . Peptic ulcer     Past Surgical History:  Procedure Laterality Date  . EP IMPLANTABLE DEVICE Left   . EP IMPLANTABLE DEVICE N/A 07/27/2016   Procedure: BIV Pacemaker downgrade;  Surgeon: Marinus Maw, MD;  Location: [facility] INVASIVE CV LAB;  Service: Cardiovascular;  Laterality: N/A;  . ESOPHAGOGASTRODUODENOSCOPY  06/24/2002  . L-spine  1965  . Lumbar L4-5 & S1  02/2000  . TOTAL KNEE ARTHROPLASTY  1997   right  . TOTAL KNEE ARTHROPLASTY  Left 02/15/2015   Procedure: LEFT TOTAL KNEE ARTHROPLASTY;  Surgeon: Durene Romans, MD;  Location: [facility] ORS;  Service: Orthopedics;  Laterality: Left;    Social History   Socioeconomic History  . Marital status: Widowed    Spouse name: Not on file  . Number of children: Not on file  . Years of education: Not on file  . Highest education level: Not on file  Occupational History  . Occupation: Retired  Engineer, production  . Financial resource strain: Not on file  . Food insecurity:    Worry: Not on file    Inability: Not on file  . Transportation needs:    Medical: Not on file    Non-medical: Not on  file  Tobacco Use  . Smoking status: Never Smoker  . Smokeless tobacco: Never Used  Substance and Sexual Activity  . Alcohol use: No  . Drug use: No  . Sexual activity: Not on file  Lifestyle  . Physical activity:    Days per week: Not on file    Minutes per session: Not on file  . Stress: Not on file  Relationships  . Social connections:    Talks on phone: Not on file    Gets together: Not on file    Attends religious service: Not on file    Active member of club or organization: Not on file    Attends meetings of clubs or organizations: Not on file    Relationship status: Not on file  . Intimate partner violence:    Fear of current or ex partner: Not on file    Emotionally abused: Not on file    Physically abused: Not on file    Forced sexual activity: Not on file  Other Topics Concern  . Not on file  Social History Narrative  . Not on file    Family History  Problem Relation Age of Onset  . Heart disease Father   . Heart disease Mother   . Diabetes Mother   . Hypertension Brother   . Hypertension Brother   . Cancer Neg Hx     ROS: no fevers or chills, productive cough, hemoptysis, dysphasia, odynophagia, melena, hematochezia, dysuria, hematuria, rash, seizure activity, orthopnea, PND, claudication. Remaining systems are negative.  Physical Exam: Well-developed well-nourished in no acute distress.  Skin is warm and dry.  HEENT is normal.  Neck is supple.  Chest is clear to auscultation with normal expansion.  Cardiovascular exam is irregular Abdominal exam nontender or distended. No masses palpated. Extremities show 1+ edema. neuro grossly intact   A/P  1 chronic combined systolic/diastolic congestive heart failure-patient appears to be euvolemic.  Continue present dose of Lasix.  Check potassium and renal function.  I have asked him to weigh daily and he will take an additional 40 to 80 mg of Lasix for weight gain of 2 pounds.  Continue fluid restriction  and low-sodium diet.  2 permanent atrial fibrillation-patient's heart rate is controlled on no AV nodal blocking agents.  Continue apixaban.  3 PVCs-amiodarone discontinued previously.  4 cardiomyopathy-patient's blood pressure appears to be stable.  Continue lisinopril.  I will not advance as his systolic runs approximately 110 at home.  He has had problems with orthostasis in the past as well.  He is not on carvedilol as he has been bradycardic.  5 hypertension-patient's blood pressure is controlled.  Continue present medications and follow.  6 prior ICD-Per electrophysiology.  Olga Millers, MD

## 2018-12-30 ENCOUNTER — Encounter: Payer: Self-pay | Admitting: Cardiology

## 2018-12-30 ENCOUNTER — Ambulatory Visit: Payer: Medicare HMO | Admitting: Cardiology

## 2018-12-30 VITALS — BP 130/80 | HR 82 | Ht 68.0 in | Wt 221.0 lb

## 2018-12-30 DIAGNOSIS — I482 Chronic atrial fibrillation, unspecified: Secondary | ICD-10-CM

## 2018-12-30 DIAGNOSIS — I428 Other cardiomyopathies: Secondary | ICD-10-CM

## 2018-12-30 DIAGNOSIS — I5042 Chronic combined systolic (congestive) and diastolic (congestive) heart failure: Secondary | ICD-10-CM | POA: Diagnosis not present

## 2018-12-30 MED ORDER — FUROSEMIDE 40 MG PO TABS: 80.0000 mg | ORAL_TABLET | Freq: Every day | ORAL | 3 refills | Status: DC

## 2018-12-30 NOTE — Patient Instructions (Signed)
Medication Instructions:  NO CHANGE If you need a refill on your cardiac medications before your next appointment, please call your pharmacy.   Lab work: Your physician recommends that you HAVE LAB WORK TODAY If you have labs (blood work) drawn today and your tests are completely normal, you will receive your results only by: [name] MyChart Message (if you have MyChart) OR . A paper copy in the mail If you have any lab test that is abnormal or we need to change your treatment, we will call you to review the results.  Follow-Up: At [facility], you and your health needs are our priority.  As part of our continuing mission to provide you with exceptional heart care, we have created designated Provider Care Teams.  These Care Teams include your primary Cardiologist (physician) and Advanced Practice Providers (APPs -  Physician Assistants and Nurse Practitioners) who all work together to provide you with the care you need, when you need it. You will need a follow up appointment in 6 months.  Please call our office 2 months in advance to schedule this appointment.  You may see Olga Millers, MD or one of the following Advanced Practice Providers on your designated Care Team:   Corine Shelter, PA-C Judy Pimple, [place] . Marjie Skiff, PA-C  CALL IN MAY TO SCHEDULE APPOINTMENT IN [place]

## 2018-12-31 ENCOUNTER — Encounter: Payer: Self-pay | Admitting: *Deleted

## 2018-12-31 LAB — CBC
HEMOGLOBIN: 13.2 g/dL (ref 13.0–17.7)
Hematocrit: 38.3 % (ref 37.5–51.0)
MCH: 31.7 pg (ref 26.6–33.0)
MCHC: 34.5 g/dL (ref 31.5–35.7)
MCV: 92 fL (ref 79–97)
Platelets: 131 10*3/uL — ABNORMAL LOW (ref 150–450)
RBC: 4.17 x10E6/uL (ref 4.14–5.80)
RDW: 12.6 % (ref 11.6–15.4)
WBC: 5.7 10*3/uL (ref 3.4–10.8)

## 2018-12-31 LAB — BASIC METABOLIC PANEL
BUN/Creatinine Ratio: 23 (ref 10–24)
BUN: 22 mg/dL (ref 8–27)
CO2: 24 mmol/L (ref 20–29)
Calcium: 9.4 mg/dL (ref 8.6–10.2)
Chloride: 94 mmol/L — ABNORMAL LOW (ref 96–106)
Creatinine, Ser: 0.97 mg/dL (ref 0.76–1.27)
GFR calc Af Amer: 81 mL/min/{1.73_m2} (ref >59)
GFR, EST NON AFRICAN AMERICAN: 70 mL/min/{1.73_m2} (ref >59)
Glucose: 137 mg/dL — ABNORMAL HIGH (ref 65–99)
Potassium: 3.9 mmol/L (ref 3.5–5.2)
Sodium: 137 mmol/L (ref 134–144)

## 2019-01-14 ENCOUNTER — Encounter: Payer: Self-pay | Admitting: Family Medicine

## 2019-01-14 ENCOUNTER — Other Ambulatory Visit: Payer: Self-pay | Admitting: Cardiology

## 2019-01-14 ENCOUNTER — Ambulatory Visit (INDEPENDENT_AMBULATORY_CARE_PROVIDER_SITE_OTHER): Payer: Medicare HMO | Admitting: Family Medicine

## 2019-01-14 VITALS — BP 110/64 | HR 87 | Temp 98.1°F | Ht 68.0 in | Wt 225.2 lb

## 2019-01-14 DIAGNOSIS — I10 Essential (primary) hypertension: Secondary | ICD-10-CM | POA: Diagnosis not present

## 2019-01-14 DIAGNOSIS — R413 Other amnesia: Secondary | ICD-10-CM | POA: Diagnosis not present

## 2019-01-14 DIAGNOSIS — R001 Bradycardia, unspecified: Secondary | ICD-10-CM

## 2019-01-14 DIAGNOSIS — E1121 Type 2 diabetes mellitus with diabetic nephropathy: Secondary | ICD-10-CM | POA: Diagnosis not present

## 2019-01-14 DIAGNOSIS — E785 Hyperlipidemia, unspecified: Secondary | ICD-10-CM | POA: Diagnosis not present

## 2019-01-14 MED ORDER — ALLOPURINOL 100 MG PO TABS: 100.0000 mg | ORAL_TABLET | Freq: Every day | ORAL | 3 refills | Status: DC

## 2019-01-14 MED ORDER — DONEPEZIL HCL 5 MG PO TABS: 5.0000 mg | ORAL_TABLET | Freq: Every day | ORAL | 3 refills | Status: DC

## 2019-01-14 MED ORDER — TAMSULOSIN HCL 0.4 MG PO CAPS: 0.4000 mg | ORAL_CAPSULE | Freq: Two times a day (BID) | ORAL | 3 refills | Status: DC

## 2019-01-14 NOTE — Progress Notes (Signed)
[name] [place] DOB: Aug 31, 1929 Encounter date: 01/14/2019  This is a 84 y.o. male who presents to establish care. Chief Complaint  Patient presents with  . New Patient (Initial Visit)    History of present illness:  Not sleeping very well at all. When he does sleep he has horrible dreams. Getting worse. Hard to get good nights sleep. Getting little cat naps. Sleep started to go downhill about 5 years ago. At that time lost wife and two brothers.   Back breaks out, worse in warmer weather. Looks like hives, itches, gets raw. Mostly in summer time. Started this past summer.   HTN: stays similar to today; not having episodes of light headedness/dizziness. Does well with this now.  A fib, hx MI, systolic HF, AICD: Follows with Dr. Jens Som and Dr. Ladona Ridgel. Not on beta blocker due to bradycardia episodes. Some orthostasis. Euvolemic. On eliquis He does keep close check on his salt intake and watches his fluid intake very carefully.   HL: on lipitor for this   WU:JWJX follow with podiatry; diabetes is diet controlled  Arthritis:low back, knees, shoulders. Ruptured disc in back in past; has had two back surgeries. Both knees have been replaced.   CBC,BMP stable 12/30/18 (plt low 131)  A1C 09/2018 was 6.3 (down from 6.8 04/2018)  Does walk around house daily. Does leg lifts/exercises he learned in rehab.   Past Medical History:  Diagnosis Date  . Anemia   . Atrial fibrillation (HCC)   . BPH (benign prostatic hyperplasia)   . Bradycardia   . Cardiomyopathy   . CHF (congestive heart failure) (HCC)   . Diabetes mellitus type II   . GERD (gastroesophageal reflux disease)   . History of colonoscopy   . HTN (hypertension)   . Hyperlipidemia   . ICD (implantable cardiac defibrillator) in place 10/14/2012   biventricular  . OA (osteoarthritis)   . Obesity   . Peptic ulcer    Past Surgical History:  Procedure Laterality Date  . EP IMPLANTABLE DEVICE Left   . EP IMPLANTABLE  DEVICE N/A 07/27/2016   Procedure: BIV Pacemaker downgrade;  Surgeon: Marinus Maw, MD;  Location: [facility] INVASIVE CV LAB;  Service: Cardiovascular;  Laterality: N/A;  . ESOPHAGOGASTRODUODENOSCOPY  06/24/2002  . L-spine  1965  . Lumbar L4-5 & S1  02/2000  . TOTAL KNEE ARTHROPLASTY  1997   right  . TOTAL KNEE ARTHROPLASTY Left 02/15/2015   Procedure: LEFT TOTAL KNEE ARTHROPLASTY;  Surgeon: Durene Romans, MD;  Location: [facility] ORS;  Service: Orthopedics;  Laterality: Left;   No Known Allergies Current Meds  Medication Sig  . acetaminophen (TYLENOL) 650 MG CR tablet Take 650-1,300 mg by mouth every 8 (eight) hours as needed for pain.  [name] allopurinol (ZYLOPRIM) 100 MG tablet Take 1 tablet (100 mg total) by mouth daily.  [name] apixaban (ELIQUIS) 5 MG TABS tablet Take 1 tablet (5 mg total) by mouth 2 (two) times daily.  [name] atorvastatin (LIPITOR) 20 MG tablet Take 1 tablet (20 mg total) by mouth at bedtime.  . cholecalciferol (VITAMIN D) 1000 units tablet Take 1,000 Units by mouth daily.  [name] donepezil (ARICEPT) 5 MG tablet Take 1 tablet (5 mg total) by mouth at bedtime.  . furosemide (LASIX) 40 MG tablet Take 2 tablets (80 mg total) by mouth daily.  . magnesium oxide (MAG-OX) 400 (241.3 Mg) MG tablet TAKE 1 TABLET (400 MG TOTAL) BY MOUTH 2 (TWO) TIMES DAILY.  [name] polyethylene glycol (MIRALAX / GLYCOLAX) packet Take 17  g by mouth daily as needed (constipation).  . potassium chloride SA (K-DUR,KLOR-CON) 20 MEQ tablet Take 1 tablet (20 mEq total) by mouth daily.  . tamsulosin (FLOMAX) 0.4 MG CAPS capsule Take 1 capsule (0.4 mg total) by mouth 2 (two) times daily.  . [DISCONTINUED] allopurinol (ZYLOPRIM) 100 MG tablet TAKE 1 TABLET EVERY DAY  . [DISCONTINUED] donepezil (ARICEPT) 5 MG tablet Take 1 tablet (5 mg total) by mouth at bedtime.  . [DISCONTINUED] tamsulosin (FLOMAX) 0.4 MG CAPS capsule Take 1 capsule (0.4 mg total) by mouth 2 (two) times daily.   Social History   Tobacco Use  . Smoking status: Never Smoker   . Smokeless tobacco: Never Used  Substance Use Topics  . Alcohol use: No   Family History  Problem Relation Age of Onset  . Heart disease Father   . Heart disease Mother   . Diabetes Mother   . Hypertension Brother   . Hypertension Brother   . Cancer Neg Hx      Review of Systems  Constitutional: Negative for chills, fatigue and fever.  Respiratory: Negative for cough, chest tightness, shortness of breath and wheezing.   Cardiovascular: Negative for chest pain, palpitations and leg swelling.    Objective:  BP 110/64 (BP Location: Left Arm, Patient Position: Sitting, Cuff Size: Normal)   Pulse 87   Temp 98.1 F (36.7 C) (Oral)   Ht 5\' 8"  (1.727 m)   Wt 225 lb 3.2 oz (102.2 kg)   SpO2 98%   BMI 34.24 kg/m   Weight: 225 lb 3.2 oz (102.2 kg)   BP Readings from Last 3 Encounters:  01/14/19 110/64  12/30/18 130/80  12/22/18 132/78   Wt Readings from Last 3 Encounters:  01/14/19 225 lb 3.2 oz (102.2 kg)  12/30/18 221 lb (100.2 kg)  12/22/18 225 lb (102.1 kg)    Physical Exam Constitutional:      General: He is not in acute distress.    Appearance: He is well-developed.  Cardiovascular:     Rate and Rhythm: Normal rate and regular rhythm.     Heart sounds: Murmur present. Systolic murmur present with a grade of 2/6. No friction rub.  Pulmonary:     Effort: Pulmonary effort is normal. No respiratory distress.     Breath sounds: Normal breath sounds. No wheezing or rales.  Musculoskeletal:     Right lower leg: No edema.     Left lower leg: No edema.  Neurological:     Mental Status: He is alert and oriented to person, place, and time.  Psychiatric:        Behavior: Behavior normal.     Assessment/Plan: 1. Controlled type 2 diabetes mellitus with diabetic nephropathy, without long-term current use of insulin (HCC) Diet controlled; will recheck labs at follow up in 3 months.  - Comprehensive metabolic panel; Future - Hemoglobin A1c; Future - Microalbumin /  creatinine urine ratio; Future  2. Dyslipidemia Controlled on current medication.  - Comprehensive metabolic panel; Future - Lipid panel; Future  3. Diabetic nephropathy associated with type 2 diabetes mellitus (HCC) Has been stable.  - Hemoglobin A1c; Future - Microalbumin / creatinine urine ratio; Future  4. Memory loss Feels that aricept has potentially been somewhat helpful. I will review record to see more about initiation of this medication.  5. Essential hypertension Stable; continue current medications. Monitored by patient as well as cardiology.  - Comprehensive metabolic panel; Future - CBC with Differential/Platelet; Future  6. Bradycardia See above.  Return in about 3 months (around 04/14/2019) for Chronic condition visit.  Theodis Shove, MD

## 2019-01-14 NOTE — Patient Instructions (Signed)
OK for trial of melatonin to help with sleep onset.

## 2019-01-18 ENCOUNTER — Encounter: Payer: Self-pay | Admitting: Family Medicine

## 2019-01-23 ENCOUNTER — Telehealth: Payer: Self-pay

## 2019-01-23 ENCOUNTER — Ambulatory Visit (INDEPENDENT_AMBULATORY_CARE_PROVIDER_SITE_OTHER): Payer: Medicare HMO

## 2019-01-23 DIAGNOSIS — I5022 Chronic systolic (congestive) heart failure: Secondary | ICD-10-CM

## 2019-01-23 DIAGNOSIS — Z950 Presence of cardiac pacemaker: Secondary | ICD-10-CM | POA: Diagnosis not present

## 2019-01-23 NOTE — Telephone Encounter (Signed)
Left message for patient to remind of missed remote transmission.  

## 2019-01-26 ENCOUNTER — Telehealth: Payer: Self-pay

## 2019-01-26 NOTE — Progress Notes (Signed)
EPIC Encounter for ICM Monitoring  Patient Name: [facility] Pooler is a 85 y.o. male Date: 01/26/2019 Primary Care Physican: Caren Macadam, MD Primary Cardiologist:Crenshaw Electrophysiologist:Taylor BiV Pacing: 89.1% Last Weight:231lbs Today's Weight: unknown   OBSERVATIONS (3)  V. Pacing less than 90%.  Patient Activity less than 1 hr/day for 4 weeks.  1 monitored VT episodes, longest was 5 sec.     Attempted call to daughter and unable to reach.  Left detailed message regarding transmission per DPR.  Transmission reviewed.     Report: Thoracic impedance normal.   Prescribed: Furosemide 40 mg take 2 tablets (80 mg total) daily.  May take an additional 40 to 80 mg of Lasix for weight gain of 2 pounds per Dr Jacalyn Lefevre note 12/30/2018.  Potassium 20 mEq 1tablet daily.  Labs: 12/30/2018 Creatinine 3.9, BUN 137, Potassium 0.97, Sodium 22, GFR 70-81 07/15/2018 Creatinine 0.89, BUN 19, Potassium 4.1, Sodium 141, EGFR 77-89 A complete set of results can be found in Results Review.  Recommendations:  Left voice mail with ICM number and encouraged to call if experiencing any fluid symptoms.  Follow-up plan: ICM clinic phone appointment on 02/24/2019.    Copy of ICM check sent to Dr. Lovena Le.   3 month ICM trend: 01/23/2019    1 Year ICM trend:       Rosalene Billings, RN 01/26/2019 10:00 AM

## 2019-01-26 NOTE — Telephone Encounter (Signed)
Remote ICM transmission received.  Attempted call to daughter regarding ICM remote transmission and left detailed message, per DPR, with next ICM remote transmission date of 02/24/2019.  Advised to return call for any fluid symptoms or questions.

## 2019-02-18 ENCOUNTER — Other Ambulatory Visit: Payer: Self-pay | Admitting: Cardiology

## 2019-02-18 LAB — CUP PACEART INCLINIC DEVICE CHECK
Battery Remaining Longevity: 44 mo
Battery Voltage: 2.99 V
Brady Statistic AP VP Percent: 0 %
Brady Statistic AP VS Percent: 0 %
Brady Statistic AS VS Percent: 0 %
Date Time Interrogation Session: 20200114022822
Implantable Lead Implant Date: 20131105
Implantable Lead Implant Date: 20131105
Implantable Lead Implant Date: 20131105
Implantable Lead Location: 753858
Implantable Lead Location: 753859
Implantable Lead Location: 753860
Implantable Lead Model: 5076
Implantable Lead Model: 6935
Implantable Pulse Generator Implant Date: 20170818
Lead Channel Impedance Value: 304 Ohm
Lead Channel Impedance Value: 323 Ohm
Lead Channel Impedance Value: 342 Ohm
Lead Channel Impedance Value: 342 Ohm
Lead Channel Impedance Value: 399 Ohm
Lead Channel Impedance Value: 418 Ohm
Lead Channel Impedance Value: 437 Ohm
Lead Channel Impedance Value: 608 Ohm
Lead Channel Pacing Threshold Amplitude: 0.75 V
Lead Channel Pacing Threshold Amplitude: 1 V
Lead Channel Pacing Threshold Pulse Width: 0.4 ms
Lead Channel Pacing Threshold Pulse Width: 1 ms
Lead Channel Setting Pacing Amplitude: 2 V
Lead Channel Setting Pacing Amplitude: 2 V
Lead Channel Setting Pacing Pulse Width: 0.4 ms
Lead Channel Setting Pacing Pulse Width: 1.5 ms
Lead Channel Setting Sensing Sensitivity: 0.9 mV
MDC IDC MSMT LEADCHNL LV IMPEDANCE VALUE: 475 Ohm
MDC IDC STAT BRADY AS VP PERCENT: 0 %
MDC IDC STAT BRADY RA PERCENT PACED: 0 %
MDC IDC STAT BRADY RV PERCENT PACED: 86.61 %

## 2019-02-24 ENCOUNTER — Ambulatory Visit (INDEPENDENT_AMBULATORY_CARE_PROVIDER_SITE_OTHER): Payer: Medicare HMO

## 2019-02-24 ENCOUNTER — Other Ambulatory Visit: Payer: Self-pay

## 2019-02-24 DIAGNOSIS — I5022 Chronic systolic (congestive) heart failure: Secondary | ICD-10-CM | POA: Diagnosis not present

## 2019-02-24 DIAGNOSIS — Z950 Presence of cardiac pacemaker: Secondary | ICD-10-CM

## 2019-02-25 NOTE — Progress Notes (Signed)
EPIC Encounter for ICM Monitoring  Patient Name: [facility] Baltzell is a 85 y.o. male Date: 02/25/2019 Primary Care Physican: Caren Macadam, MD Primary Cardiologist:Crenshaw Electrophysiologist:Taylor BiV Pacing: 90.2% Last Weight:231lbs Today's Weight: unknown                                      Attempted call to daughter and unable to reach.  Left detailed message regarding transmission per DPR.  Transmission reviewed.     Report: Thoracic impedance normal.   Prescribed: Furosemide 40 mg take 2 tablets (80 mg total) daily.  May take an additional 40 to 80 mg of Lasix for weight gain of 2 pounds per Dr Jacalyn Lefevre note 12/30/2018.  Potassium 20 mEq 1tablet daily.  Labs: 12/30/2018 Creatinine 3.9, BUN 137, Potassium 0.97, Sodium 22, GFR 70-81 07/15/2018 Creatinine 0.89, BUN 19, Potassium 4.1, Sodium 141, EGFR 77-89 A complete set of results can be found in Results Review.  Recommendations:  Left voice mail with ICM number and encouraged to call if experiencing any fluid symptoms.  Follow-up plan: ICM clinic phone appointment on 03/30/2019 (91 day 4/21)   Copy of ICM check sent to Dr. Lovena Le.   3 month ICM trend: 02/25/2019    1 Year ICM trend:       Rosalene Billings, RN 02/25/2019 12:02 PM

## 2019-03-20 ENCOUNTER — Telehealth: Payer: Self-pay

## 2019-03-20 NOTE — Telephone Encounter (Signed)
Returned call to daughter, Bradley Ferris.  Clarified home remote appointment dates.  She stated patient is doing fine.  Weight 217 - 218 lbs.

## 2019-03-25 ENCOUNTER — Ambulatory Visit: Payer: Medicare HMO | Admitting: Podiatry

## 2019-03-30 ENCOUNTER — Other Ambulatory Visit: Payer: Self-pay

## 2019-03-31 ENCOUNTER — Encounter: Payer: Medicare HMO | Admitting: *Deleted

## 2019-03-31 ENCOUNTER — Other Ambulatory Visit: Payer: Self-pay

## 2019-04-01 ENCOUNTER — Telehealth: Payer: Self-pay

## 2019-04-01 NOTE — Telephone Encounter (Signed)
Left message for patient to remind of missed remote transmission.  

## 2019-04-03 ENCOUNTER — Telehealth: Payer: Self-pay

## 2019-04-03 NOTE — Telephone Encounter (Signed)
Spoke with patient to remind of missed remote transmission 

## 2019-04-06 ENCOUNTER — Telehealth: Payer: Self-pay

## 2019-04-06 NOTE — Telephone Encounter (Signed)
Call to daughter.  Advised remote transmission rescheduled.  She said [facility] is sending a new monitor and transmission can be sent by phone through the app. She said patient is doing fine and weight is stable. No fluid symptoms.  Advised to call when she receives new monitor.  Transmission scheduled for 5/18 but she will call if she receives the monitor and sends a transmission earlier than that date.

## 2019-04-15 ENCOUNTER — Other Ambulatory Visit: Payer: Self-pay

## 2019-04-15 ENCOUNTER — Encounter: Payer: Self-pay | Admitting: Family Medicine

## 2019-04-15 ENCOUNTER — Ambulatory Visit (INDEPENDENT_AMBULATORY_CARE_PROVIDER_SITE_OTHER): Payer: Medicare HMO | Admitting: Family Medicine

## 2019-04-15 VITALS — BP 112/68 | Wt 219.0 lb

## 2019-04-15 DIAGNOSIS — I10 Essential (primary) hypertension: Secondary | ICD-10-CM

## 2019-04-15 DIAGNOSIS — N400 Enlarged prostate without lower urinary tract symptoms: Secondary | ICD-10-CM

## 2019-04-15 DIAGNOSIS — K219 Gastro-esophageal reflux disease without esophagitis: Secondary | ICD-10-CM | POA: Diagnosis not present

## 2019-04-15 DIAGNOSIS — M1991 Primary osteoarthritis, unspecified site: Secondary | ICD-10-CM | POA: Diagnosis not present

## 2019-04-15 DIAGNOSIS — I4821 Permanent atrial fibrillation: Secondary | ICD-10-CM

## 2019-04-15 DIAGNOSIS — E785 Hyperlipidemia, unspecified: Secondary | ICD-10-CM | POA: Diagnosis not present

## 2019-04-15 DIAGNOSIS — R413 Other amnesia: Secondary | ICD-10-CM | POA: Diagnosis not present

## 2019-04-15 DIAGNOSIS — M1990 Unspecified osteoarthritis, unspecified site: Secondary | ICD-10-CM | POA: Diagnosis not present

## 2019-04-15 NOTE — Progress Notes (Addendum)
Virtual Visit via Video Note  I connected with [employer]  on 04/15/19 at  2:30 PM EDT by a video enabled telemedicine application and verified that I am speaking with the correct person using two identifiers.  Location patient: home Location provider:work or home office Persons participating in the virtual visit: patient, provider  I discussed the limitations of evaluation and management by telemedicine and the availability of in person appointments. The patient expressed understanding and agreed to proceed.   [name] DOB: Aug 04, 1929 Encounter date: 04/15/2019  This is a 85 y.o. male who presents with Chief Complaint  Patient presents with  . Follow-up    History of present illness: Last visit we discussed difficulty with sleeping, back rash. Plan was to recheck bloodwork in 3 months time. Also was going to try melatonin for sleep.  They are still weighing daily. Doing well with this. Staying pretty consistent with weight. Takes extra fluid pill if up a couple of times.   HL: on lipitor DM: diet controlled, follows with podiatry.  Arthritis: back, knees, shoulders - about the same. Still depending on cane for getting around. Feet and legs aren't swelling as much, but keeps some tightness so this in combo with other joints bothers him some. Walks through house and out on porch daily.   Doing very well.   They did try melatonin for sleep and he states that he is sleeping more but dreams are worse. Does feel more rested in morning. States that bad dreams are tolerable; just dreaming of things from past. Nothing gory. Just states they are bad because they seem real and happened so long ago.   Has had rash try to come back with heat; but it is manageable. Encouraged to take picture and send to me if recurs.   They are waiting for new machine to send transmissions from his device. This new one will be transmitted through daughter's phone.       No  Known Allergies Current Meds  Medication Sig  . acetaminophen (TYLENOL) 650 MG CR tablet Take 650-1,300 mg by mouth every 8 (eight) hours as needed for pain.  [name] allopurinol (ZYLOPRIM) 100 MG tablet Take 1 tablet (100 mg total) by mouth daily.  [name] atorvastatin (LIPITOR) 20 MG tablet TAKE 1 TABLET EVERY DAY  . cholecalciferol (VITAMIN D) 1000 units tablet Take 1,000 Units by mouth daily.  [name] donepezil (ARICEPT) 5 MG tablet Take 1 tablet (5 mg total) by mouth at bedtime.  [name] ELIQUIS 5 MG TABS tablet TAKE 1 TABLET(5 MG) BY MOUTH TWICE DAILY  . furosemide (LASIX) 40 MG tablet Take 2 tablets (80 mg total) by mouth daily.  . magnesium oxide (MAG-OX) 400 (241.3 Mg) MG tablet TAKE 1 TABLET (400 MG TOTAL) BY MOUTH 2 (TWO) TIMES DAILY.  [name] MELATONIN PO Take by mouth at bedtime.  . polyethylene glycol (MIRALAX / GLYCOLAX) packet Take 17 g by mouth daily as needed (constipation).  . potassium chloride SA (K-DUR,KLOR-CON) 20 MEQ tablet Take 1 tablet (20 mEq total) by mouth daily.  . tamsulosin (FLOMAX) 0.4 MG CAPS capsule Take 1 capsule (0.4 mg total) by mouth 2 (two) times daily.    Review of Systems  Constitutional: Negative for chills, fatigue and fever.  Respiratory: Negative for cough, chest tightness, shortness of breath and wheezing.   Cardiovascular: Negative for chest pain, palpitations and leg swelling.    Objective:  BP 112/68 Comment: per pts daughter-taken by pt-jaf  Wt 219 lb (99.3 kg)  BMI 33.30 kg/m   Weight: 219 lb (99.3 kg)   BP Readings from Last 3 Encounters:  04/15/19 112/68  01/14/19 110/64  12/30/18 130/80   Wt Readings from Last 3 Encounters:  04/15/19 219 lb (99.3 kg)  01/14/19 225 lb 3.2 oz (102.2 kg)  12/30/18 221 lb (100.2 kg)    EXAM:  GENERAL: alert, oriented, appears well and in no acute distress  HEENT: atraumatic, conjunctiva clear, no obvious abnormalities on inspection of external nose and ears  NECK: normal movements of the head and neck  LUNGS: on  inspection no signs of respiratory distress, breathing rate appears normal, no obvious gross SOB, gasping or wheezing  CV: no obvious cyanosis  MS: moves all visible extremities without noticeable abnormality  PSYCH/NEURO: pleasant and cooperative, no obvious depression or anxiety, speech and thought processing grossly intact  Assessment/Plan 1. Essential hypertension Stable.  Continue current medications.  2. Gastroesophageal reflux disease, esophagitis presence not specified Stable without medications.  3. Primary osteoarthritis, unspecified site Manages okay with arthritis without taking regular medications.  Urged him to continue with staying active and walking on a regular basis.  5. Benign prostatic hyperplasia without lower urinary tract symptoms Flomax.  6. Hyperlipidemia, unspecified hyperlipidemia type Has been stable on Lipitor.  7. A fib: On Eliquis.  Stable.  8. Memory loss: Table.  Daughter checks in with him regularly.  He no longer drives.  Tolerating Aricept and they do feel it has been somewhat beneficial.  Return bloodwork in July; appt to follow pending result review.  I discussed the assessment and treatment plan with the patient. The patient was provided an opportunity to ask questions and all were answered. The patient agreed with the plan and demonstrated an understanding of the instructions.   The patient was advised to call back or seek an in-person evaluation if the symptoms worsen or if the condition fails to improve as anticipated.  I provided 25 minutes of non-face-to-face time during this encounter.   Theodis Shove, MD

## 2019-04-27 ENCOUNTER — Other Ambulatory Visit: Payer: Self-pay

## 2019-04-27 ENCOUNTER — Ambulatory Visit (INDEPENDENT_AMBULATORY_CARE_PROVIDER_SITE_OTHER): Payer: Medicare HMO

## 2019-04-27 DIAGNOSIS — Z950 Presence of cardiac pacemaker: Secondary | ICD-10-CM

## 2019-04-27 DIAGNOSIS — I5022 Chronic systolic (congestive) heart failure: Secondary | ICD-10-CM

## 2019-04-28 ENCOUNTER — Telehealth: Payer: Self-pay

## 2019-04-28 NOTE — Telephone Encounter (Signed)
Left message for patient to remind of missed remote transmission.  

## 2019-05-01 NOTE — Progress Notes (Signed)
EPIC Encounter for ICM Monitoring  Patient Name: [name] is a 85 y.o. male Date: 05/01/2019 Primary Care Physican: Caren Macadam, MD Primary Cardiologist:Crenshaw Electrophysiologist:Taylor BiV Pacing: 88.2% Last Weight:231lbs   Transmission reviewed and results sent via mychart.   Optivol Thoracic impedance trending just below normal.   Prescribed: Furosemide40 mg take 2 tablets (80 mg total) daily. May take an additional 40 to 80 mg of Lasix for weight gain of 2 poundsper Dr Jacalyn Lefevre note 12/30/2018.Potassium 20 mEq 1tablet daily.  Labs: 12/30/2018 Creatinine3.9, FRH312, Potassium0.97, Sodium22, [id number] 07/15/2018 Creatinine 0.89, BUN 19, Potassium 4.1, Sodium 141, EGFR 77-89 A complete set of results can be found in Results Review.  Recommendations:None.  Follow-up plan: ICM clinic phone appointment on6/22/2020  Copy of ICM check sent to Dr.Taylor.   3 month ICM trend: 04/28/2019    1 Year ICM trend:       Rosalene Billings, RN 05/01/2019 11:17 AM

## 2019-06-04 ENCOUNTER — Other Ambulatory Visit: Payer: Self-pay | Admitting: Cardiology

## 2019-06-07 ENCOUNTER — Other Ambulatory Visit: Payer: Self-pay | Admitting: Cardiology

## 2019-06-10 NOTE — Progress Notes (Signed)
No ICM remote transmission received for 06/01/2019 and next ICM transmission scheduled for 06/22/2019.

## 2019-06-16 ENCOUNTER — Other Ambulatory Visit: Payer: Self-pay

## 2019-06-16 ENCOUNTER — Other Ambulatory Visit (INDEPENDENT_AMBULATORY_CARE_PROVIDER_SITE_OTHER): Payer: Medicare HMO

## 2019-06-16 DIAGNOSIS — I10 Essential (primary) hypertension: Secondary | ICD-10-CM

## 2019-06-16 DIAGNOSIS — E785 Hyperlipidemia, unspecified: Secondary | ICD-10-CM | POA: Diagnosis not present

## 2019-06-16 DIAGNOSIS — E1121 Type 2 diabetes mellitus with diabetic nephropathy: Secondary | ICD-10-CM | POA: Diagnosis not present

## 2019-06-16 LAB — CBC WITH DIFFERENTIAL/PLATELET
Basophils Absolute: 0 10*3/uL (ref 0.0–0.1)
Basophils Relative: 0.5 % (ref 0.0–3.0)
Eosinophils Absolute: 0.1 10*3/uL (ref 0.0–0.7)
Eosinophils Relative: 1.3 % (ref 0.0–5.0)
HCT: 40.9 % (ref 39.0–52.0)
Hemoglobin: 13.5 g/dL (ref 13.0–17.0)
Lymphocytes Relative: 21.5 % (ref 12.0–46.0)
Lymphs Abs: 1.6 10*3/uL (ref 0.7–4.0)
MCHC: 33.1 g/dL (ref 30.0–36.0)
MCV: 97.8 fl (ref 78.0–100.0)
Monocytes Absolute: 0.7 10*3/uL (ref 0.1–1.0)
Monocytes Relative: 9.2 % (ref 3.0–12.0)
Neutro Abs: 4.9 10*3/uL (ref 1.4–7.7)
Neutrophils Relative %: 67.5 % (ref 43.0–77.0)
Platelets: 130 10*3/uL — ABNORMAL LOW (ref 150.0–400.0)
RBC: 4.18 Mil/uL — ABNORMAL LOW (ref 4.22–5.81)
RDW: 14.6 % (ref 11.5–15.5)
WBC: 7.2 10*3/uL (ref 4.0–10.5)

## 2019-06-16 LAB — COMPREHENSIVE METABOLIC PANEL
ALT: 16 U/L (ref 0–53)
AST: 19 U/L (ref 0–37)
Albumin: 4.2 g/dL (ref 3.5–5.2)
Alkaline Phosphatase: 85 U/L (ref 39–117)
BUN: 21 mg/dL (ref 6–23)
CO2: 30 mEq/L (ref 19–32)
Calcium: 9.1 mg/dL (ref 8.4–10.5)
Chloride: 99 mEq/L (ref 96–112)
Creatinine, Ser: 1 mg/dL (ref 0.40–1.50)
GFR: 70.47 mL/min (ref >60.00)
Glucose, Bld: 134 mg/dL — ABNORMAL HIGH (ref 70–99)
Potassium: 3.9 mEq/L (ref 3.5–5.1)
Sodium: 139 mEq/L (ref 135–145)
Total Bilirubin: 0.8 mg/dL (ref 0.2–1.2)
Total Protein: 6.9 g/dL (ref 6.0–8.3)

## 2019-06-16 LAB — LIPID PANEL
Cholesterol: 163 mg/dL (ref 0–200)
HDL: 74.6 mg/dL (ref >39.00)
LDL Cholesterol: 78 mg/dL (ref 0–99)
NonHDL: 88.53
Total CHOL/HDL Ratio: 2
Triglycerides: 54 mg/dL (ref 0.0–149.0)
VLDL: 10.8 mg/dL (ref 0.0–40.0)

## 2019-06-16 LAB — MICROALBUMIN / CREATININE URINE RATIO
Creatinine,U: 38.5 mg/dL
Microalb Creat Ratio: 6.2 mg/g (ref 0.0–30.0)
Microalb, Ur: 2.4 mg/dL — ABNORMAL HIGH (ref 0.0–1.9)

## 2019-06-16 LAB — HEMOGLOBIN A1C: Hgb A1c MFr Bld: 6.8 % — ABNORMAL HIGH (ref 4.6–6.5)

## 2019-06-29 ENCOUNTER — Telehealth: Payer: Self-pay

## 2019-06-29 NOTE — Telephone Encounter (Signed)
Spoke with patient's daughter, Neoma Laming.  She has been staying at her daughters due to recovering from surgery.  Rescheduled ICM remote transmission for 07/14/2019 and she said patient is feeling fine.  No changes today.

## 2019-06-29 NOTE — Telephone Encounter (Signed)
Returned call to daughter as requested by voice mail message regarding remote transmission.  Left message to return call.

## 2019-07-14 ENCOUNTER — Ambulatory Visit (INDEPENDENT_AMBULATORY_CARE_PROVIDER_SITE_OTHER): Payer: Medicare HMO

## 2019-07-14 DIAGNOSIS — I5022 Chronic systolic (congestive) heart failure: Secondary | ICD-10-CM

## 2019-07-14 DIAGNOSIS — Z950 Presence of cardiac pacemaker: Secondary | ICD-10-CM

## 2019-07-14 DIAGNOSIS — Z95810 Presence of automatic (implantable) cardiac defibrillator: Secondary | ICD-10-CM

## 2019-07-17 NOTE — Progress Notes (Signed)
EPIC Encounter for ICM Monitoring  Patient Name: [facility] Lada is a 85 y.o. male Date: 07/17/2019 Primary Care Physican: Caren Macadam, MD Primary Cardiologist:Crenshaw Electrophysiologist:Taylor BiV Pacing:80.6% Last Weight:231lbs   Attempted call to daughter Trinidad Curet and unable to reach.  Left detailed message per DPR regarding transmission. Transmission reviewed.   Optivol Thoracic impedance suggesting normal since 06/22/2019.   Prescribed: Furosemide40 mg take 2 tablets (80 mg total) daily. May take an additional 40 to 80 mg of Lasix for weight gain of 2 poundsper Dr Jacalyn Lefevre note 12/30/2018.Potassium 20 mEq 1tablet daily.  Labs: 06/16/2019 Creatinine 1.0, BUN 29, Potassium 3.9, Sodium 139, GFR 70-47 A complete set of results can be found in Results Review.  Recommendations:Left voice mail with ICM number and encouraged to call if experiencing any fluid symptoms.  Follow-up plan: ICM clinic phone appointment on9/07/2019  Copy of ICM check sent to Dr.Taylor.  3 month ICM trend: 07/14/2019    1 Year ICM trend:       Rosalene Billings, RN 07/17/2019 1:53 PM

## 2019-07-23 ENCOUNTER — Other Ambulatory Visit: Payer: Self-pay | Admitting: Cardiology

## 2019-07-30 NOTE — Progress Notes (Signed)
HPI: FU nonischemic cardiomyopathy as well as atrial fibrillation. Note, he had a catheterization in January 2006 that showed no coronary artery disease and an ejection fraction of 40%. He had a Myoview performed last on May 04, 2008, that showed an ejection fraction of 31%. There was felt to be a prior inferior infarct with mild peri-infarct ischemia. I did review this and felt there was a low risk and we have been treating medically. He does have permanent atrial fibrillation as well. Holter monitor in August of 2011 showed mildly reduced rate and we decreased his Coreg. In November of 2013 the patient had a biventricular ICD placed; downgraded to [place] pacemaker 8/17. Echocardiogram March 2019 showed ejection fraction 40 to 45%, moderate left ventricular hypertrophy, moderate left ventricular enlargement, mild to moderate mitral regurgitation, severe biatrial enlargement. Since I last saw him,there is no dyspnea, chest pain, palpitations or syncope.  He has chronic pedal edema which is stable on present dose of diuretic.  Current Outpatient Medications  Medication Sig Dispense Refill  . acetaminophen (TYLENOL) 650 MG CR tablet Take 650-1,300 mg by mouth every 8 (eight) hours as needed for pain.    [name] allopurinol (ZYLOPRIM) 100 MG tablet Take 1 tablet (100 mg total) by mouth daily. 90 tablet 3  . atorvastatin (LIPITOR) 20 MG tablet TAKE 1 TABLET EVERY DAY 90 tablet 0  . cholecalciferol (VITAMIN D) 1000 units tablet Take 1,000 Units by mouth daily.    [name] donepezil (ARICEPT) 5 MG tablet Take 1 tablet (5 mg total) by mouth at bedtime. 90 tablet 3  . ELIQUIS 5 MG TABS tablet TAKE 1 TABLET(5 MG) BY MOUTH TWICE DAILY 60 tablet 3  . furosemide (LASIX) 40 MG tablet Take 2 tablets (80 mg total) by mouth daily. 180 tablet 3  . lisinopril (ZESTRIL) 2.5 MG tablet Take 1 tablet (2.5 mg total) by mouth daily. Keep August Appointment 90 tablet 0  . magnesium oxide (MAG-OX) 400 (241.3 Mg) MG tablet TAKE 1  TABLET (400 MG TOTAL) BY MOUTH 2 (TWO) TIMES DAILY. 180 tablet 3  . MELATONIN PO Take by mouth at bedtime.    . polyethylene glycol (MIRALAX / GLYCOLAX) packet Take 17 g by mouth daily as needed (constipation).    . potassium chloride SA (K-DUR) 20 MEQ tablet TAKE 1 TABLET EVERY DAY 90 tablet 3  . tamsulosin (FLOMAX) 0.4 MG CAPS capsule Take 1 capsule (0.4 mg total) by mouth 2 (two) times daily. 180 capsule 3   No current facility-administered medications for this visit.      Past Medical History:  Diagnosis Date  . Anemia   . Atrial fibrillation ([place])   . BPH (benign prostatic hyperplasia)   . Bradycardia   . Cardiomyopathy   . CHF (congestive heart failure) ([place])   . Diabetes mellitus type II   . GERD (gastroesophageal reflux disease)   . History of colonoscopy   . HTN (hypertension)   . Hyperlipidemia   . ICD (implantable cardiac defibrillator) in place 10/14/2012   biventricular  . OA (osteoarthritis)   . Obesity   . Peptic ulcer     Past Surgical History:  Procedure Laterality Date  . EP IMPLANTABLE DEVICE Left   . EP IMPLANTABLE DEVICE N/A 07/27/2016   Procedure: BIV Pacemaker downgrade;  Surgeon: Evans Lance, MD;  Location: [facility];  Service: Cardiovascular;  Laterality: N/A;  . ESOPHAGOGASTRODUODENOSCOPY  06/24/2002  . L-spine  1965  . Lumbar L4-5 & S1  02/2000  . TOTAL KNEE ARTHROPLASTY  1997   right  . TOTAL KNEE ARTHROPLASTY Left 02/15/2015   Procedure: LEFT TOTAL KNEE ARTHROPLASTY;  Surgeon: Durene Romans, MD;  Location: [facility];  Service: Orthopedics;  Laterality: Left;    Social History   Socioeconomic History  . Marital status: Widowed    Spouse name: Not on file  . Number of children: Not on file  . Years of education: Not on file  . Highest education level: Not on file  Occupational History  . Occupation: Retired  Engineer, production  . Financial resource strain: Not on file  . Food insecurity    Worry: Not on file    Inability: Not on file   . Transportation needs    Medical: Not on file    Non-medical: Not on file  Tobacco Use  . Smoking status: Never Smoker  . Smokeless tobacco: Never Used  Substance and Sexual Activity  . Alcohol use: No  . Drug use: No  . Sexual activity: Not on file  Lifestyle  . Physical activity    Days per week: Not on file    Minutes per session: Not on file  . Stress: Not on file  Relationships  . Social Musician on phone: Not on file    Gets together: Not on file    Attends religious service: Not on file    Active member of club or organization: Not on file    Attends meetings of clubs or organizations: Not on file    Relationship status: Not on file  . Intimate partner violence    Fear of current or ex partner: Not on file    Emotionally abused: Not on file    Physically abused: Not on file    Forced sexual activity: Not on file  Other Topics Concern  . Not on file  Social History Narrative  . Not on file    Family History  Problem Relation Age of Onset  . Heart disease Father   . Heart disease Mother   . Diabetes Mother   . Hypertension Brother   . Hypertension Brother   . Cancer Neg Hx     ROS: no fevers or chills, productive cough, hemoptysis, dysphasia, odynophagia, melena, hematochezia, dysuria, hematuria, rash, seizure activity, orthopnea, PND,  claudication. Remaining systems are negative.  Physical Exam: Well-developed well-nourished in no acute distress.  Skin is warm and dry.  HEENT is normal.  Neck is supple.  Chest is clear to auscultation with normal expansion.  Cardiovascular exam is irregular Abdominal exam nontender or distended. No masses palpated. Extremities show 1+ edema. neuro grossly intact  A/P  1 chronic combined systolic/diastolic congestive heart failure-patient is doing well from a volume standpoint.  Continue present dose of Lasix. He will take additional Lasix as needed as outlined in previous notes.  2 permanent atrial  fibrillation-heart rate is controlled on no medications.  Continue apixaban.  3 PVCs-amiodarone discontinued previously.  4 cardiomyopathy-continue ACE inhibitor.  He is not on a beta-blocker because of history of bradycardia.  5 prior ICD-Per electrophysiology.  6 hypertension-blood pressure controlled.  Continue present medications.  Olga Millers, MD

## 2019-08-03 ENCOUNTER — Ambulatory Visit (INDEPENDENT_AMBULATORY_CARE_PROVIDER_SITE_OTHER): Payer: Medicare HMO | Admitting: Cardiology

## 2019-08-03 ENCOUNTER — Other Ambulatory Visit: Payer: Self-pay

## 2019-08-03 ENCOUNTER — Encounter: Payer: Self-pay | Admitting: Cardiology

## 2019-08-03 VITALS — BP 132/78 | HR 87 | Temp 97.7°F | Ht 68.5 in | Wt 215.0 lb

## 2019-08-03 DIAGNOSIS — I5022 Chronic systolic (congestive) heart failure: Secondary | ICD-10-CM

## 2019-08-03 DIAGNOSIS — I10 Essential (primary) hypertension: Secondary | ICD-10-CM

## 2019-08-03 DIAGNOSIS — I482 Chronic atrial fibrillation, unspecified: Secondary | ICD-10-CM

## 2019-08-03 DIAGNOSIS — I428 Other cardiomyopathies: Secondary | ICD-10-CM | POA: Diagnosis not present

## 2019-08-03 NOTE — Patient Instructions (Signed)

## 2019-08-18 ENCOUNTER — Ambulatory Visit (INDEPENDENT_AMBULATORY_CARE_PROVIDER_SITE_OTHER): Payer: Medicare HMO

## 2019-08-18 DIAGNOSIS — Z95810 Presence of automatic (implantable) cardiac defibrillator: Secondary | ICD-10-CM | POA: Diagnosis not present

## 2019-08-18 DIAGNOSIS — I5022 Chronic systolic (congestive) heart failure: Secondary | ICD-10-CM | POA: Diagnosis not present

## 2019-08-19 ENCOUNTER — Encounter: Payer: Self-pay | Admitting: Podiatry

## 2019-08-19 ENCOUNTER — Telehealth: Payer: Self-pay

## 2019-08-19 ENCOUNTER — Other Ambulatory Visit: Payer: Self-pay

## 2019-08-19 ENCOUNTER — Ambulatory Visit: Payer: Medicare HMO | Admitting: Podiatry

## 2019-08-19 DIAGNOSIS — M79674 Pain in right toe(s): Secondary | ICD-10-CM | POA: Diagnosis not present

## 2019-08-19 DIAGNOSIS — M79675 Pain in left toe(s): Secondary | ICD-10-CM | POA: Diagnosis not present

## 2019-08-19 DIAGNOSIS — E119 Type 2 diabetes mellitus without complications: Secondary | ICD-10-CM | POA: Diagnosis not present

## 2019-08-19 DIAGNOSIS — D689 Coagulation defect, unspecified: Secondary | ICD-10-CM | POA: Diagnosis not present

## 2019-08-19 DIAGNOSIS — B351 Tinea unguium: Secondary | ICD-10-CM | POA: Diagnosis not present

## 2019-08-19 NOTE — Telephone Encounter (Signed)
Remote ICM transmission received.  Attempted call to daughter, Trinidad Curet, regarding ICM remote transmission and left detailed message per DPR.  Advised to return call for any fluid symptoms or questions.

## 2019-08-19 NOTE — Progress Notes (Signed)
EPIC Encounter for ICM Monitoring  Patient Name: [facility] [place] is a 85 y.o. male Date: 08/19/2019 Primary Care Physican: Caren Macadam, MD Primary Cardiologist:Crenshaw Electrophysiologist:Taylor BiV Pacing:80.6% Last Weight:231lbs   Attempted call to daughter Trinidad Curet and unable to reach.  Left detailed message per DPR regarding transmission. Transmission reviewed.   OptivolThoracic impedancenormal.   Prescribed: Furosemide40 mg take 2 tablets (80 mg total) daily. May take an additional 40 to 80 mg of Lasix for weight gain of 2 poundsper Dr Jacalyn Lefevre note 12/30/2018 and reiterated 08/03/2019.Potassium 20 mEq 1tablet daily.  Labs: 06/16/2019 Creatinine 1.0, BUN 29, Potassium 3.9, Sodium 139, GFR 70-47 A complete set of results can be found in Results Review.  Recommendations: Left voice mail with ICM number and encouraged to call if patient is experiencing any fluid symptoms.  Follow-up plan: ICM clinic phone appointment on 10/06/2019.   91 day device clinic remote transmission 09/01/2019.     Copy of ICM check sent to Dr. Lovena Le.   3 month ICM trend: 08/18/2019    1 Year ICM trend:       Rosalene Billings, RN 08/19/2019 1:23 PM

## 2019-08-19 NOTE — Progress Notes (Signed)
Complaint:  Visit Type: Patient returns to my office for continued preventative foot care services. Complaint: Patient states" my nails have grown long and thick and become painful to walk and wear shoes" Patient has been diagnosed with DM with no foot complications. The patient presents for preventative foot care services. No changes to ROS.  Patient is taking coumadin.  Patient has not been seen in over 7 months.  Podiatric Exam: Vascular: dorsalis pedis and posterior tibial pulses are palpable bilateral. Capillary return is immediate. Temperature gradient is WNL. Skin turgor WNL  Sensorium: Normal Semmes Weinstein monofilament test. Normal tactile sensation bilaterally. Nail Exam: Pt has thick disfigured discolored nails with subungual debris noted bilateral entire nail hallux through fifth toenails Ulcer Exam: There is no evidence of ulcer or pre-ulcerative changes or infection. Orthopedic Exam: Muscle tone and strength are WNL. No limitations in general ROM. No crepitus or effusions noted. Foot type and digits show no abnormalities. Bony prominences are unremarkable. Skin: Asymptomatic  Porokeratosis  Sub 1 sub heel right foot.. No infection or ulcers  Diagnosis:  Onychomycosis, , Pain in right toe, pain in left toes  Treatment & Plan Procedures and Treatment: Consent by patient was obtained for treatment procedures.   Debridement of mycotic and hypertrophic toenails, 1 through 5 bilateral and clearing of subungual debris. No ulceration, no infection noted.  Return Visit-Office Procedure: Patient instructed to return to the office for a follow up visit 3 months for continued evaluation and treatment.    Gardiner Barefoot DPM

## 2019-08-24 NOTE — Progress Notes (Addendum)
Returned call to daughter Neoma Laming and transmission reviewed.  She said he is doing well and denies any fluid symptoms.  No changes and encouraged to call if experiencing any fluid symptoms.

## 2019-08-25 ENCOUNTER — Other Ambulatory Visit: Payer: Self-pay | Admitting: Cardiology

## 2019-09-01 ENCOUNTER — Ambulatory Visit (INDEPENDENT_AMBULATORY_CARE_PROVIDER_SITE_OTHER): Payer: Medicare HMO | Admitting: *Deleted

## 2019-09-01 DIAGNOSIS — R001 Bradycardia, unspecified: Secondary | ICD-10-CM

## 2019-09-01 DIAGNOSIS — I4821 Permanent atrial fibrillation: Secondary | ICD-10-CM

## 2019-09-01 LAB — CUP PACEART REMOTE DEVICE CHECK
Battery Remaining Longevity: 42 mo
Battery Voltage: 2.98 V
Brady Statistic AP VP Percent: 0 %
Brady Statistic AP VS Percent: 0 %
Brady Statistic AS VP Percent: 0 %
Brady Statistic AS VS Percent: 0 %
Brady Statistic RA Percent Paced: 0 %
Brady Statistic RV Percent Paced: 74.53 %
Date Time Interrogation Session: 20200922203215
Implantable Lead Implant Date: 20131105
Implantable Lead Implant Date: 20131105
Implantable Lead Implant Date: 20131105
Implantable Lead Location: 753858
Implantable Lead Location: 753859
Implantable Lead Location: 753860
Implantable Lead Model: 4296
Implantable Lead Model: 5076
Implantable Lead Model: 6935
Implantable Pulse Generator Implant Date: 20170818
Lead Channel Impedance Value: 304 Ohm
Lead Channel Impedance Value: 323 Ohm
Lead Channel Impedance Value: 342 Ohm
Lead Channel Impedance Value: 342 Ohm
Lead Channel Impedance Value: 380 Ohm
Lead Channel Impedance Value: 399 Ohm
Lead Channel Impedance Value: 437 Ohm
Lead Channel Impedance Value: 456 Ohm
Lead Channel Impedance Value: 570 Ohm
Lead Channel Pacing Threshold Amplitude: 0.625 V
Lead Channel Pacing Threshold Amplitude: 0.875 V
Lead Channel Pacing Threshold Pulse Width: 0.4 ms
Lead Channel Pacing Threshold Pulse Width: 1.5 ms
Lead Channel Sensing Intrinsic Amplitude: 3.75 mV
Lead Channel Sensing Intrinsic Amplitude: 3.75 mV
Lead Channel Setting Pacing Amplitude: 2 V
Lead Channel Setting Pacing Amplitude: 2 V
Lead Channel Setting Pacing Pulse Width: 0.4 ms
Lead Channel Setting Pacing Pulse Width: 1.5 ms
Lead Channel Setting Sensing Sensitivity: 0.9 mV

## 2019-09-02 ENCOUNTER — Telehealth (INDEPENDENT_AMBULATORY_CARE_PROVIDER_SITE_OTHER): Payer: Medicare HMO | Admitting: Family Medicine

## 2019-09-02 ENCOUNTER — Other Ambulatory Visit: Payer: Self-pay

## 2019-09-02 DIAGNOSIS — I4821 Permanent atrial fibrillation: Secondary | ICD-10-CM | POA: Diagnosis not present

## 2019-09-02 DIAGNOSIS — M1990 Unspecified osteoarthritis, unspecified site: Secondary | ICD-10-CM

## 2019-09-02 DIAGNOSIS — G4700 Insomnia, unspecified: Secondary | ICD-10-CM

## 2019-09-02 DIAGNOSIS — I10 Essential (primary) hypertension: Secondary | ICD-10-CM | POA: Diagnosis not present

## 2019-09-02 DIAGNOSIS — R413 Other amnesia: Secondary | ICD-10-CM

## 2019-09-02 DIAGNOSIS — E1121 Type 2 diabetes mellitus with diabetic nephropathy: Secondary | ICD-10-CM | POA: Diagnosis not present

## 2019-09-02 DIAGNOSIS — E785 Hyperlipidemia, unspecified: Secondary | ICD-10-CM

## 2019-09-02 DIAGNOSIS — N400 Enlarged prostate without lower urinary tract symptoms: Secondary | ICD-10-CM

## 2019-09-02 MED ORDER — APIXABAN 5 MG PO TABS: ORAL_TABLET | ORAL | 5 refills | Status: AC

## 2019-09-02 NOTE — Progress Notes (Signed)
Virtual Visit via Video Note  I connected with [employer]  on 09/02/19 at  3:00 PM EDT by a video enabled telemedicine application and verified that I am speaking with the correct person using two identifiers.  Location patient: home Location provider:work or home office Persons participating in the virtual visit: patient, provider  I discussed the limitations of evaluation and management by telemedicine and the availability of in person appointments. The patient expressed understanding and agreed to proceed.   [place] DOB: Oct 28, 1929 Encounter date: 09/02/2019  This is a 85 y.o. male who presents with No chief complaint on file.   History of present illness: Got good report from cardiology. They haven't been checking blood pressures at home due to machine dying.   Doing ok with arthritis. Exercising regularly. Walking through house and walking on porch.   Still recording weight every day. They are sending in transmissions for this. When weight creeps up to 220 then takes extra fluid pill. They keep close eye on it. Feet and legs are much better than they used to be stay with some swelling, but good about elevating and feels this has been stable.   Wakes frequently through night. Dreams still more intense. Still taking the melatonin 10mg . Does feel rested in the morning. Sometimes goes to bathroom, but usually just waking up. Sometimes nods off to sleep in afternoon.   Doing ok with memory. About the same.   Got flu shot already from pharmacy.    No Known Allergies No outpatient medications have been marked as taking for the 09/02/19 encounter (Telemedicine) with Caren Macadam, MD.    Review of Systems  Constitutional: Negative for chills, fatigue and fever.  Respiratory: Negative for cough, chest tightness, shortness of breath and wheezing.   Cardiovascular: Negative for chest pain, palpitations and leg swelling.    Objective:  There  were no vitals taken for this visit.      BP Readings from Last 3 Encounters:  08/03/19 132/78  04/15/19 112/68  01/14/19 110/64   Wt Readings from Last 3 Encounters:  08/03/19 215 lb (97.5 kg)  04/15/19 219 lb (99.3 kg)  01/14/19 225 lb 3.2 oz (102.2 kg)    EXAM:  GENERAL: alert, oriented, appears well and in no acute distress  HEENT: atraumatic, conjunctiva clear, no obvious abnormalities on inspection of external nose and ears  NECK: normal movements of the head and neck  LUNGS: on inspection no signs of respiratory distress, breathing rate appears normal, no obvious gross SOB, gasping or wheezing  CV: no obvious cyanosis  MS: moves all visible extremities without noticeable abnormality  PSYCH/NEURO: pleasant and cooperative, no obvious depression or anxiety, speech and thought processing grossly intact   Assessment/Plan  1. Permanent atrial fibrillation Following with cardiology. - apixaban (ELIQUIS) 5 MG TABS tablet; TAKE 1 TABLET(5 MG) BY MOUTH TWICE DAILY  Dispense: 60 tablet; Refill: 5  2. Hyperlipidemia, unspecified hyperlipidemia type Cont lipitor - Lipid panel; Future  3. Memory loss Stable. On aricept.   4. Benign prostatic hyperplasia without lower urinary tract symptoms Continue with Flomax.  5. Osteoarthritis, unspecified osteoarthritis type, unspecified site Exercises on a daily basis which helps his joints.  Feels that he manages well with arthritis.  6. Essential hypertension Has been well controlled.  Continue current medications. - CBC with Differential/Platelet; Future - Comprehensive metabolic panel; Future  7. Insomnia, unspecified type Doing better with melatonin.  Consider time release to help maintain sleep through the night.  8. Controlled type 2 diabetes mellitus with diabetic nephropathy, without long-term current use of insulin (HCC) stable - Hemoglobin A1c; Future  Return in about 5 months (around 02/02/2020) for [facility]  january; visit in feb-march.     I discussed the assessment and treatment plan with the patient. The patient was provided an opportunity to ask questions and all were answered. The patient agreed with the plan and demonstrated an understanding of the instructions.   The patient was advised to call back or seek an in-person evaluation if the symptoms worsen or if the condition fails to improve as anticipated.  I provided 28 minutes of non-face-to-face time during this encounter.   Theodis Shove, MD

## 2019-09-04 ENCOUNTER — Telehealth: Payer: Self-pay | Admitting: *Deleted

## 2019-09-04 NOTE — Telephone Encounter (Signed)
-----   Message from Caren Macadam, MD sent at 09/03/2019  9:08 PM EDT ----- Please help schedule labwork visit in January and then in office visit feb-march

## 2019-09-04 NOTE — Telephone Encounter (Signed)
I called the pts daughter and scheduled appts as below.

## 2019-09-14 NOTE — Progress Notes (Signed)
Remote pacemaker transmission.   

## 2019-10-06 ENCOUNTER — Ambulatory Visit (INDEPENDENT_AMBULATORY_CARE_PROVIDER_SITE_OTHER): Payer: Medicare HMO

## 2019-10-06 ENCOUNTER — Telehealth: Payer: Self-pay

## 2019-10-06 DIAGNOSIS — I5022 Chronic systolic (congestive) heart failure: Secondary | ICD-10-CM

## 2019-10-06 DIAGNOSIS — Z950 Presence of cardiac pacemaker: Secondary | ICD-10-CM

## 2019-10-06 NOTE — Telephone Encounter (Signed)
Left message for patient to remind of missed remote transmission.  

## 2019-10-07 ENCOUNTER — Telehealth: Payer: Self-pay

## 2019-10-07 NOTE — Telephone Encounter (Signed)
Remote ICM transmission received.  Attempted call to daughter Trinidad Curet regarding ICM remote transmission and left detailed message per DPR.  Advised to return call for any fluid symptoms or questions. Next ICM remote transmission scheduled 11/10/2019.

## 2019-10-07 NOTE — Progress Notes (Signed)
EPIC Encounter for ICM Monitoring  Patient Name: [facility] Ganus is a 85 y.o. male Date: 10/07/2019 Primary Care Physican: Caren Macadam, MD Primary Cardiologist:Crenshaw Electrophysiologist:Taylor BiV Pacing:74.2% Last Weight:231lbs   Attempted call todaughter Havery Moros unable to reach. Left detailed message per DPR regarding transmission. Transmission reviewed.   OptivolThoracic impedancenormal.   Prescribed: Furosemide40 mg take 2 tablets (80 mg total) daily. May take an additional 40 to 80 mg of Lasix for weight gain of 2 poundsper Dr Jacalyn Lefevre note 12/30/2018 and reiterated 08/03/2019.Potassium 20 mEq 1tablet daily.  Labs: 06/16/2019 Creatinine 1.0, BUN 29, Potassium 3.9, Sodium 139, GFR 70-47 A complete set of results can be found in Results Review.  Recommendations: Left voice mail with ICM number and encouraged to call if patient is experiencing any fluid symptoms.  Follow-up plan: ICM clinic phone appointment on 11/10/2019 to recheck fluid levels.   91 day device clinic remote transmission 12/01/2019.    Copy of ICM check sent to Dr. Lovena Le.   3 month ICM trend: 10/06/2019    1 Year ICM trend:       Rosalene Billings, RN 10/07/2019 5:02 PM

## 2019-10-19 NOTE — Progress Notes (Signed)
Daughter called and she said patient has lost 3-4 lbs within 2 days (217 lbs to 213 lbs) and feeling general weakness over the weekend. He did not experience any dizziness, lightheadedness and BP within normal range.  She gave him Furosemide 40 mg 1 tablet yesterday, 11/8, instead of 2 tablets and he is better today.  She has not checked his weight today.  Encouraged to send remote transmission today for review for dryness.  She will send this afternoon when she arrives at patient's house.

## 2019-10-20 NOTE — Progress Notes (Signed)
Spoke with daughter.  Pt reports feeling better for past 2 days and weight has increased by 1 lb.  10/20/2019 Optivol report suggests fluid levels are normal.  No changes today and encouraged to call if patient experience any fluid symptoms.   10/20/2019 report

## 2019-10-23 ENCOUNTER — Other Ambulatory Visit: Payer: Self-pay | Admitting: Cardiology

## 2019-10-23 ENCOUNTER — Other Ambulatory Visit: Payer: Self-pay | Admitting: Family Medicine

## 2019-11-10 ENCOUNTER — Telehealth: Payer: Self-pay

## 2019-11-10 NOTE — Telephone Encounter (Signed)
Left message for patient to remind of missed remote transmission.  

## 2019-11-16 NOTE — Progress Notes (Signed)
No ICM remote transmission received for 11/09/2019 and next ICM transmission scheduled for 11/24/2019.   

## 2019-11-18 ENCOUNTER — Ambulatory Visit: Payer: Medicare HMO | Admitting: Podiatry

## 2019-11-18 ENCOUNTER — Other Ambulatory Visit: Payer: Self-pay

## 2019-11-18 ENCOUNTER — Encounter: Payer: Self-pay | Admitting: Podiatry

## 2019-11-18 DIAGNOSIS — B351 Tinea unguium: Secondary | ICD-10-CM | POA: Diagnosis not present

## 2019-11-18 DIAGNOSIS — M79674 Pain in right toe(s): Secondary | ICD-10-CM

## 2019-11-18 DIAGNOSIS — E119 Type 2 diabetes mellitus without complications: Secondary | ICD-10-CM | POA: Diagnosis not present

## 2019-11-18 DIAGNOSIS — D689 Coagulation defect, unspecified: Secondary | ICD-10-CM | POA: Diagnosis not present

## 2019-11-18 DIAGNOSIS — M79675 Pain in left toe(s): Secondary | ICD-10-CM | POA: Diagnosis not present

## 2019-11-18 NOTE — Progress Notes (Signed)
Complaint:  Visit Type: Patient returns to my office for continued preventative foot care services. Complaint: Patient states" my nails have grown long and thick and become painful to walk and wear shoes" Patient has been diagnosed with DM with no foot complications. The patient presents for preventative foot care services. No changes to ROS.  Patient is taking coumadin.  Patient has developed a painful callus right foot.  Podiatric Exam: Vascular: dorsalis pedis and posterior tibial pulses are palpable bilateral. Capillary return is immediate. Temperature gradient is WNL. Skin turgor WNL Severe venous stasis leg  B/L. Sensorium: Normal Semmes Weinstein monofilament test. Normal tactile sensation bilaterally. Nail Exam: Pt has thick disfigured discolored nails with subungual debris noted bilateral entire nail hallux through fifth toenails Ulcer Exam: There is no evidence of ulcer or pre-ulcerative changes or infection. Orthopedic Exam: Muscle tone and strength are WNL. No limitations in general ROM. No crepitus or effusions noted. Foot type and digits show no abnormalities. Bony prominences are unremarkable. Skin: Asymptomatic  Porokeratosis  Sub 1, sub heel right foot.asymptomatic.  Symptomatic porokeratosis sub 5th metabase right foot.   No infection or ulcers  Diagnosis:  Onychomycosis, , Pain in right toe, pain in left toes,  Porokeratosis right foot.  Treatment & Plan Procedures and Treatment: Consent by patient was obtained for treatment procedures.   Debridement of mycotic and hypertrophic toenails, 1 through 5 bilateral and clearing of subungual debris. No ulceration, no infection noted. Debride porokeratosis right foot. Return Visit-Office Procedure: Patient instructed to return to the office for a follow up visit 3 months for continued evaluation and treatment.    Gardiner Barefoot DPM

## 2019-11-19 ENCOUNTER — Other Ambulatory Visit: Payer: Self-pay | Admitting: Family Medicine

## 2019-12-01 ENCOUNTER — Ambulatory Visit (INDEPENDENT_AMBULATORY_CARE_PROVIDER_SITE_OTHER): Payer: Medicare HMO | Admitting: *Deleted

## 2019-12-01 DIAGNOSIS — I4821 Permanent atrial fibrillation: Secondary | ICD-10-CM

## 2019-12-02 ENCOUNTER — Telehealth: Payer: Self-pay

## 2019-12-02 ENCOUNTER — Ambulatory Visit (INDEPENDENT_AMBULATORY_CARE_PROVIDER_SITE_OTHER): Payer: Medicare HMO

## 2019-12-02 DIAGNOSIS — I5022 Chronic systolic (congestive) heart failure: Secondary | ICD-10-CM

## 2019-12-02 DIAGNOSIS — Z95810 Presence of automatic (implantable) cardiac defibrillator: Secondary | ICD-10-CM

## 2019-12-02 DIAGNOSIS — Z950 Presence of cardiac pacemaker: Secondary | ICD-10-CM

## 2019-12-02 NOTE — Progress Notes (Signed)
EPIC Encounter for ICM Monitoring  Patient Name: [facility] Garrels is a 85 y.o. male Date: 12/02/2019 Primary Care Physican: Caren Macadam, MD Primary Cardiologist:Crenshaw Electrophysiologist:Taylor BiV Pacing:65.4% Last Weight:231lbs   Attempted call todaughter Havery Moros unable to reach. Left detailed message per DPR regarding transmission. Transmission reviewed.   OptivolThoracic impedancenormal.  Prescribed: Furosemide40 mg take 2 tablets (80 mg total) daily. May take an additional 40 to 80 mg of Lasix for weight gain of 2 poundsper Dr Jacalyn Lefevre note 1/21/2020and reiterated 08/03/2019.Potassium 20 mEq 1tablet daily.  Labs: 06/16/2019 Creatinine 1.0, BUN 29, Potassium 3.9, Sodium 139, GFR 70-47 A complete set of results can be found in Results Review.  Recommendations:Left voice mail with ICM number and encouraged to call ifpatient isexperiencing any fluid symptoms.  Follow-up plan: ICM clinic phone appointment on 01/04/2020.   91 day device clinic remote transmission 03/01/2020.  Office visit with Dr Lovena Le 01/11/2020.  Copy of ICM check sent to Dr. Lovena Le.   3 month ICM trend: 12/01/2019    1 Year ICM trend:       Rosalene Billings, RN 12/02/2019 11:24 AM

## 2019-12-02 NOTE — Telephone Encounter (Signed)
Remote ICM transmission received.  Attempted call to daughter regarding ICM remote transmission and left detailed message per DPR.  Advised to return call for any fluid symptoms or questions. Next ICM remote transmission scheduled 01/04/2020.

## 2019-12-03 LAB — CUP PACEART REMOTE DEVICE CHECK
Battery Remaining Longevity: 37 mo
Battery Voltage: 2.98 V
Brady Statistic AP VP Percent: 0 %
Brady Statistic AP VS Percent: 0 %
Brady Statistic AS VP Percent: 0 %
Brady Statistic AS VS Percent: 0 %
Brady Statistic RA Percent Paced: 0 %
Brady Statistic RV Percent Paced: 65.38 %
Date Time Interrogation Session: 20201223002905
Implantable Lead Implant Date: 20131105
Implantable Lead Implant Date: 20131105
Implantable Lead Implant Date: 20131105
Implantable Lead Location: 753858
Implantable Lead Location: 753859
Implantable Lead Location: 753860
Implantable Lead Model: 4296
Implantable Lead Model: 5076
Implantable Lead Model: 6935
Implantable Pulse Generator Implant Date: 20170818
Lead Channel Impedance Value: 304 Ohm
Lead Channel Impedance Value: 323 Ohm
Lead Channel Impedance Value: 323 Ohm
Lead Channel Impedance Value: 323 Ohm
Lead Channel Impedance Value: 361 Ohm
Lead Channel Impedance Value: 380 Ohm
Lead Channel Impedance Value: 437 Ohm
Lead Channel Impedance Value: 437 Ohm
Lead Channel Impedance Value: 551 Ohm
Lead Channel Pacing Threshold Amplitude: 0.625 V
Lead Channel Pacing Threshold Amplitude: 0.875 V
Lead Channel Pacing Threshold Pulse Width: 0.4 ms
Lead Channel Pacing Threshold Pulse Width: 1.5 ms
Lead Channel Sensing Intrinsic Amplitude: 5.25 mV
Lead Channel Sensing Intrinsic Amplitude: 5.25 mV
Lead Channel Setting Pacing Amplitude: 2 V
Lead Channel Setting Pacing Amplitude: 2 V
Lead Channel Setting Pacing Pulse Width: 0.4 ms
Lead Channel Setting Pacing Pulse Width: 1.5 ms
Lead Channel Setting Sensing Sensitivity: 0.9 mV

## 2019-12-07 ENCOUNTER — Telehealth: Payer: Self-pay | Admitting: Family Medicine

## 2019-12-07 ENCOUNTER — Encounter: Payer: Self-pay | Admitting: Family Medicine

## 2019-12-07 ENCOUNTER — Other Ambulatory Visit: Payer: Self-pay

## 2019-12-07 ENCOUNTER — Ambulatory Visit (INDEPENDENT_AMBULATORY_CARE_PROVIDER_SITE_OTHER): Payer: Medicare HMO | Admitting: Family Medicine

## 2019-12-07 ENCOUNTER — Other Ambulatory Visit: Payer: Self-pay | Admitting: Family Medicine

## 2019-12-07 VITALS — BP 122/58 | HR 53 | Temp 97.9°F | Ht 68.5 in | Wt 225.6 lb

## 2019-12-07 DIAGNOSIS — R232 Flushing: Secondary | ICD-10-CM

## 2019-12-07 DIAGNOSIS — R531 Weakness: Secondary | ICD-10-CM | POA: Diagnosis not present

## 2019-12-07 DIAGNOSIS — R0600 Dyspnea, unspecified: Secondary | ICD-10-CM | POA: Diagnosis not present

## 2019-12-07 LAB — CBC WITH DIFFERENTIAL/PLATELET
Basophils Absolute: 0 10*3/uL (ref 0.0–0.1)
Basophils Relative: 0.4 % (ref 0.0–3.0)
Eosinophils Absolute: 0 10*3/uL (ref 0.0–0.7)
Eosinophils Relative: 0.5 % (ref 0.0–5.0)
HCT: 38 % — ABNORMAL LOW (ref 39.0–52.0)
Hemoglobin: 12.4 g/dL — ABNORMAL LOW (ref 13.0–17.0)
Lymphocytes Relative: 15.1 % (ref 12.0–46.0)
Lymphs Abs: 1.2 10*3/uL (ref 0.7–4.0)
MCHC: 32.7 g/dL (ref 30.0–36.0)
MCV: 96.7 fl (ref 78.0–100.0)
Monocytes Absolute: 0.7 10*3/uL (ref 0.1–1.0)
Monocytes Relative: 9.5 % (ref 3.0–12.0)
Neutro Abs: 5.8 10*3/uL (ref 1.4–7.7)
Neutrophils Relative %: 74.5 % (ref 43.0–77.0)
Platelets: 125 10*3/uL — ABNORMAL LOW (ref 150.0–400.0)
RBC: 3.93 Mil/uL — ABNORMAL LOW (ref 4.22–5.81)
RDW: 14.7 % (ref 11.5–15.5)
WBC: 7.8 10*3/uL (ref 4.0–10.5)

## 2019-12-07 NOTE — Progress Notes (Signed)
Subjective:     Patient ID: [employer], male   DOB: Apr 20, 1929, 85 y.o.   MRN: [id number]  HPI   Patient is seen accompanied by daughter.  Patient is somewhat hard of hearing and his daughter helped facilitate communication.  He is seen with some intermittent episodes over the past several weeks of flushing of the face, transient weakness, and transient shortness of breath.  They state the first time this happened he was watching television.  He states his face felt "hot ".  He actually applied some ice.  He had some transient weakness and transit shortness of breath but no chest pains.  No associated dizziness, headache, or palpitations.  No focal weakness.  No recent diarrhea symptoms.  Symptoms lasted anywhere from 30 minutes to couple hours.  He does not describe any postural orthostatic type symptoms.  No alcohol use.  No recent appetite or weight changes.  No niacin use.  His chronic problems include history of chronic systolic heart failure, history of CAD, hypertension, bigeminy, atrial fibrillation, type 2 diabetes which is controlled without medication, dyslipidemia, and gout.  Medications are reviewed.  He takes Eliquis for his atrial fibrillation.  Is on allopurinol for gout prevention.  No calcium channel blocker use or nitroglycerin use.  Past Medical History:  Diagnosis Date  . Anemia   . Atrial fibrillation (HCC)   . BPH (benign prostatic hyperplasia)   . Bradycardia   . Cardiomyopathy   . CHF (congestive heart failure) (HCC)   . Diabetes mellitus type II   . GERD (gastroesophageal reflux disease)   . History of colonoscopy   . HTN (hypertension)   . Hyperlipidemia   . ICD (implantable cardiac defibrillator) in place 10/14/2012   biventricular  . OA (osteoarthritis)   . Obesity   . Peptic ulcer    Past Surgical History:  Procedure Laterality Date  . EP IMPLANTABLE DEVICE Left   . EP IMPLANTABLE DEVICE N/A 07/27/2016   Procedure: BIV Pacemaker  downgrade;  Surgeon: Marinus Maw, MD;  Location: [facility] INVASIVE CV LAB;  Service: Cardiovascular;  Laterality: N/A;  . ESOPHAGOGASTRODUODENOSCOPY  06/24/2002  . L-spine  1965  . Lumbar L4-5 & S1  02/2000  . TOTAL KNEE ARTHROPLASTY  1997   right  . TOTAL KNEE ARTHROPLASTY Left 02/15/2015   Procedure: LEFT TOTAL KNEE ARTHROPLASTY;  Surgeon: Durene Romans, MD;  Location: [facility] ORS;  Service: Orthopedics;  Laterality: Left;    reports that he has never smoked. He has never used smokeless tobacco. He reports that he does not drink alcohol or use drugs. family history includes Diabetes in his mother; Heart disease in his father and mother; Hypertension in his brother and brother. No Known Allergies    Review of Systems  Constitutional: Negative for appetite change, chills, fever and unexpected weight change.  Respiratory: Positive for shortness of breath. Negative for cough.   Cardiovascular: Negative for chest pain and palpitations.       Chronic bilateral leg edema  Gastrointestinal: Negative for abdominal pain, diarrhea, nausea and vomiting.  Genitourinary: Negative for dysuria.  Neurological: Positive for weakness. Negative for dizziness, syncope, speech difficulty and headaches.  Hematological: Negative for adenopathy.       Objective:   Physical Exam Vitals reviewed.  Constitutional:      Appearance: Normal appearance.  Cardiovascular:     Comments: Rate is stable.  He appears to be in bigeminal type pattern by exam Pulmonary:     Effort: Pulmonary effort is  normal.     Breath sounds: Normal breath sounds.  Musculoskeletal:     Right lower leg: Edema present.     Left lower leg: Edema present.     Comments: Trace to 1+ pitting edema legs bilaterally which is near his baseline  Skin:    Findings: No rash.  Neurological:     General: No focal deficit present.     Mental Status: He is alert.     Cranial Nerves: No cranial nerve deficit.        Assessment:     Patient with  multiple chronic problems as above who presents with episodic flushing, transient dyspnea, and transient weakness.  No recent medication changes and he does not have any obvious likely medication triggers such as niacin, alcohol, calcium channel blocker, etc.  Not describing symptoms of postural orthostatic tachycardia syndrome.  Doubt pheochromocytoma.  Rule out carcinoid syndrome.  His episodic flushing, dyspnea, and transient weakness could go along with this although he is not describing any diarrhea type symptoms    Plan:     -Check 24-hour urine for 5-hydroxyindoleacetic acid -Check TSH, CBC, basic metabolic panel -Be in touch for any new or worsening symptoms -If possible, monitor blood pressure and other vital signs during episode  Eulas Post MD [facility]

## 2019-12-08 ENCOUNTER — Ambulatory Visit: Payer: Medicare HMO | Admitting: Family Medicine

## 2019-12-08 LAB — BASIC METABOLIC PANEL
BUN: 26 mg/dL — ABNORMAL HIGH (ref 6–23)
CO2: 25 mEq/L (ref 19–32)
Calcium: 9.3 mg/dL (ref 8.4–10.5)
Chloride: 101 mEq/L (ref 96–112)
Creatinine, Ser: 1.03 mg/dL (ref 0.40–1.50)
GFR: 68.03 mL/min (ref >60.00)
Glucose, Bld: 152 mg/dL — ABNORMAL HIGH (ref 70–99)
Potassium: 3.9 mEq/L (ref 3.5–5.1)
Sodium: 138 mEq/L (ref 135–145)

## 2019-12-08 LAB — TSH: TSH: 2.36 u[IU]/mL (ref 0.35–4.50)

## 2019-12-09 DIAGNOSIS — R232 Flushing: Secondary | ICD-10-CM | POA: Diagnosis not present

## 2019-12-09 NOTE — Addendum Note (Signed)
Addended by: Trenda Moots on: 25/85/2778 10:00 AM   Modules accepted: Orders

## 2019-12-11 ENCOUNTER — Emergency Department (HOSPITAL_COMMUNITY): Payer: Medicare HMO

## 2019-12-11 ENCOUNTER — Encounter (HOSPITAL_COMMUNITY): Payer: Self-pay | Admitting: Emergency Medicine

## 2019-12-11 ENCOUNTER — Inpatient Hospital Stay (HOSPITAL_COMMUNITY)
Admission: EM | Admit: 2019-12-11 | Discharge: 2019-12-18 | DRG: 391 | Disposition: A | Discharge status: Home Health | Payer: Medicare HMO | Attending: Internal Medicine | Admitting: Internal Medicine

## 2019-12-11 DIAGNOSIS — I5043 Acute on chronic combined systolic (congestive) and diastolic (congestive) heart failure: Secondary | ICD-10-CM | POA: Diagnosis not present

## 2019-12-11 DIAGNOSIS — E1121 Type 2 diabetes mellitus with diabetic nephropathy: Secondary | ICD-10-CM | POA: Diagnosis present

## 2019-12-11 DIAGNOSIS — Z7901 Long term (current) use of anticoagulants: Secondary | ICD-10-CM

## 2019-12-11 DIAGNOSIS — I340 Nonrheumatic mitral (valve) insufficiency: Secondary | ICD-10-CM | POA: Diagnosis not present

## 2019-12-11 DIAGNOSIS — Z8249 Family history of ischemic heart disease and other diseases of the circulatory system: Secondary | ICD-10-CM

## 2019-12-11 DIAGNOSIS — I428 Other cardiomyopathies: Secondary | ICD-10-CM | POA: Diagnosis not present

## 2019-12-11 DIAGNOSIS — I361 Nonrheumatic tricuspid (valve) insufficiency: Secondary | ICD-10-CM | POA: Diagnosis not present

## 2019-12-11 DIAGNOSIS — Z96652 Presence of left artificial knee joint: Secondary | ICD-10-CM | POA: Diagnosis present

## 2019-12-11 DIAGNOSIS — I255 Ischemic cardiomyopathy: Secondary | ICD-10-CM | POA: Diagnosis present

## 2019-12-11 DIAGNOSIS — I110 Hypertensive heart disease with heart failure: Secondary | ICD-10-CM | POA: Diagnosis not present

## 2019-12-11 DIAGNOSIS — I4891 Unspecified atrial fibrillation: Secondary | ICD-10-CM | POA: Diagnosis present

## 2019-12-11 DIAGNOSIS — R7989 Other specified abnormal findings of blood chemistry: Secondary | ICD-10-CM | POA: Diagnosis present

## 2019-12-11 DIAGNOSIS — I252 Old myocardial infarction: Secondary | ICD-10-CM

## 2019-12-11 DIAGNOSIS — E1129 Type 2 diabetes mellitus with other diabetic kidney complication: Secondary | ICD-10-CM | POA: Diagnosis not present

## 2019-12-11 DIAGNOSIS — H9190 Unspecified hearing loss, unspecified ear: Secondary | ICD-10-CM | POA: Diagnosis present

## 2019-12-11 DIAGNOSIS — Z20822 Contact with and (suspected) exposure to covid-19: Secondary | ICD-10-CM | POA: Diagnosis not present

## 2019-12-11 DIAGNOSIS — Z79899 Other long term (current) drug therapy: Secondary | ICD-10-CM

## 2019-12-11 DIAGNOSIS — N400 Enlarged prostate without lower urinary tract symptoms: Secondary | ICD-10-CM | POA: Diagnosis present

## 2019-12-11 DIAGNOSIS — R001 Bradycardia, unspecified: Secondary | ICD-10-CM | POA: Diagnosis present

## 2019-12-11 DIAGNOSIS — M1990 Unspecified osteoarthritis, unspecified site: Secondary | ICD-10-CM | POA: Diagnosis present

## 2019-12-11 DIAGNOSIS — I4821 Permanent atrial fibrillation: Secondary | ICD-10-CM | POA: Diagnosis present

## 2019-12-11 DIAGNOSIS — J189 Pneumonia, unspecified organism: Secondary | ICD-10-CM | POA: Diagnosis not present

## 2019-12-11 DIAGNOSIS — E785 Hyperlipidemia, unspecified: Secondary | ICD-10-CM | POA: Diagnosis present

## 2019-12-11 DIAGNOSIS — K219 Gastro-esophageal reflux disease without esophagitis: Secondary | ICD-10-CM | POA: Diagnosis not present

## 2019-12-11 DIAGNOSIS — Z8711 Personal history of peptic ulcer disease: Secondary | ICD-10-CM | POA: Diagnosis not present

## 2019-12-11 DIAGNOSIS — Z95810 Presence of automatic (implantable) cardiac defibrillator: Secondary | ICD-10-CM

## 2019-12-11 DIAGNOSIS — M109 Gout, unspecified: Secondary | ICD-10-CM | POA: Diagnosis present

## 2019-12-11 DIAGNOSIS — F0390 Unspecified dementia without behavioral disturbance: Secondary | ICD-10-CM | POA: Diagnosis present

## 2019-12-11 DIAGNOSIS — R0602 Shortness of breath: Secondary | ICD-10-CM

## 2019-12-11 DIAGNOSIS — I878 Other specified disorders of veins: Secondary | ICD-10-CM | POA: Diagnosis present

## 2019-12-11 DIAGNOSIS — R609 Edema, unspecified: Secondary | ICD-10-CM | POA: Diagnosis present

## 2019-12-11 DIAGNOSIS — Z833 Family history of diabetes mellitus: Secondary | ICD-10-CM

## 2019-12-11 DIAGNOSIS — I5022 Chronic systolic (congestive) heart failure: Secondary | ICD-10-CM | POA: Diagnosis present

## 2019-12-11 DIAGNOSIS — R69 Illness, unspecified: Secondary | ICD-10-CM | POA: Diagnosis not present

## 2019-12-11 DIAGNOSIS — I10 Essential (primary) hypertension: Secondary | ICD-10-CM | POA: Diagnosis present

## 2019-12-11 LAB — GLUCOSE, CAPILLARY: Glucose-Capillary: 133 mg/dL — ABNORMAL HIGH (ref 70–99)

## 2019-12-11 LAB — BRAIN NATRIURETIC PEPTIDE: B Natriuretic Peptide: 1035.4 pg/mL — ABNORMAL HIGH (ref 0.0–100.0)

## 2019-12-11 LAB — CBC
HCT: 38.1 % — ABNORMAL LOW (ref 39.0–52.0)
Hemoglobin: 12.6 g/dL — ABNORMAL LOW (ref 13.0–17.0)
MCH: 31.8 pg (ref 26.0–34.0)
MCHC: 33.1 g/dL (ref 30.0–36.0)
MCV: 96.2 fL (ref 80.0–100.0)
Platelets: 136 K/uL — ABNORMAL LOW (ref 150–400)
RBC: 3.96 MIL/uL — ABNORMAL LOW (ref 4.22–5.81)
RDW: 14.5 % (ref 11.5–15.5)
WBC: 8.5 K/uL (ref 4.0–10.5)
nRBC: 0 % (ref 0.0–0.2)

## 2019-12-11 LAB — BASIC METABOLIC PANEL WITH GFR
Anion gap: 13 (ref 5–15)
BUN: 34 mg/dL — ABNORMAL HIGH (ref 8–23)
CO2: 25 mmol/L (ref 22–32)
Calcium: 9.4 mg/dL (ref 8.9–10.3)
Chloride: 103 mmol/L (ref 98–111)
Creatinine, Ser: 1.23 mg/dL (ref 0.61–1.24)
GFR calc Af Amer: >60 mL/min
GFR calc non Af Amer: 52 mL/min — ABNORMAL LOW
Glucose, Bld: 161 mg/dL — ABNORMAL HIGH (ref 70–99)
Potassium: 4.1 mmol/L (ref 3.5–5.1)
Sodium: 141 mmol/L (ref 135–145)

## 2019-12-11 LAB — HEPATIC FUNCTION PANEL
ALT: 31 U/L (ref 0–44)
AST: 28 U/L (ref 15–41)
Albumin: 3.4 g/dL — ABNORMAL LOW (ref 3.5–5.0)
Alkaline Phosphatase: 83 U/L (ref 38–126)
Bilirubin, Direct: 0.4 mg/dL — ABNORMAL HIGH (ref 0.0–0.2)
Indirect Bilirubin: 0.9 mg/dL (ref 0.3–0.9)
Total Bilirubin: 1.3 mg/dL — ABNORMAL HIGH (ref 0.3–1.2)
Total Protein: 6.4 g/dL — ABNORMAL LOW (ref 6.5–8.1)

## 2019-12-11 LAB — TROPONIN I (HIGH SENSITIVITY)
Troponin I (High Sensitivity): 41 ng/L — ABNORMAL HIGH
Troponin I (High Sensitivity): 42 ng/L — ABNORMAL HIGH

## 2019-12-11 LAB — SARS CORONAVIRUS 2 (TAT 6-24 HRS): SARS Coronavirus 2: NEGATIVE

## 2019-12-11 MED ORDER — FUROSEMIDE 10 MG/ML IJ SOLN
40.0000 mg | Freq: Two times a day (BID) | INTRAMUSCULAR | Status: DC
  Administered 2019-12-12 – 2019-12-13 (×3): 40 mg via INTRAVENOUS
  Filled 2019-12-11 (×3): qty 4

## 2019-12-11 MED ORDER — TAMSULOSIN HCL 0.4 MG PO CAPS
0.4000 mg | ORAL_CAPSULE | Freq: Two times a day (BID) | ORAL | Status: DC
  Administered 2019-12-12 – 2019-12-18 (×13): 0.4 mg via ORAL
  Filled 2019-12-11 (×13): qty 1

## 2019-12-11 MED ORDER — INSULIN ASPART 100 UNIT/ML ~~LOC~~ SOLN
0.0000 [IU] | Freq: Every day | SUBCUTANEOUS | Status: DC
  Administered 2019-12-12: 23:00:00 1 [IU] via SUBCUTANEOUS

## 2019-12-11 MED ORDER — INSULIN ASPART 100 UNIT/ML ~~LOC~~ SOLN
0.0000 [IU] | Freq: Three times a day (TID) | SUBCUTANEOUS | Status: DC
  Administered 2019-12-12: 1 [IU] via SUBCUTANEOUS
  Administered 2019-12-12: 13:00:00 2 [IU] via SUBCUTANEOUS
  Administered 2019-12-12: 07:00:00 1 [IU] via SUBCUTANEOUS
  Administered 2019-12-13: 12:00:00 2 [IU] via SUBCUTANEOUS
  Administered 2019-12-14: 1 [IU] via SUBCUTANEOUS
  Administered 2019-12-14: 12:00:00 2 [IU] via SUBCUTANEOUS
  Administered 2019-12-14 – 2019-12-15 (×2): 1 [IU] via SUBCUTANEOUS

## 2019-12-11 MED ORDER — DOCUSATE SODIUM 100 MG PO CAPS
100.0000 mg | ORAL_CAPSULE | Freq: Every day | ORAL | Status: DC
  Administered 2019-12-12 – 2019-12-17 (×6): 100 mg via ORAL
  Filled 2019-12-11 (×6): qty 1

## 2019-12-11 MED ORDER — ALLOPURINOL 100 MG PO TABS
100.0000 mg | ORAL_TABLET | Freq: Every day | ORAL | Status: DC
  Administered 2019-12-12 – 2019-12-18 (×7): 100 mg via ORAL
  Filled 2019-12-11 (×7): qty 1

## 2019-12-11 MED ORDER — VITAMIN D 25 MCG (1000 UNIT) PO TABS
1000.0000 [IU] | ORAL_TABLET | Freq: Every day | ORAL | Status: DC
  Administered 2019-12-12 – 2019-12-18 (×7): 1000 [IU] via ORAL
  Filled 2019-12-11 (×7): qty 1

## 2019-12-11 MED ORDER — ONDANSETRON HCL 4 MG/2ML IJ SOLN
4.0000 mg | Freq: Four times a day (QID) | INTRAMUSCULAR | Status: DC | PRN
  Administered 2019-12-12 – 2019-12-16 (×2): 4 mg via INTRAVENOUS
  Filled 2019-12-11 (×2): qty 2

## 2019-12-11 MED ORDER — SODIUM CHLORIDE 0.9% FLUSH
3.0000 mL | Freq: Two times a day (BID) | INTRAVENOUS | Status: DC
  Administered 2019-12-11 – 2019-12-18 (×14): 3 mL via INTRAVENOUS

## 2019-12-11 MED ORDER — DONEPEZIL HCL 5 MG PO TABS
5.0000 mg | ORAL_TABLET | Freq: Every day | ORAL | Status: DC
  Administered 2019-12-11 – 2019-12-17 (×7): 5 mg via ORAL
  Filled 2019-12-11 (×7): qty 1

## 2019-12-11 MED ORDER — POTASSIUM CHLORIDE CRYS ER 20 MEQ PO TBCR
20.0000 meq | EXTENDED_RELEASE_TABLET | Freq: Every day | ORAL | Status: DC
  Administered 2019-12-12 – 2019-12-14 (×3): 20 meq via ORAL
  Filled 2019-12-11 (×3): qty 1

## 2019-12-11 MED ORDER — ACETAMINOPHEN ER 650 MG PO TBCR: 1300.0000 mg | EXTENDED_RELEASE_TABLET | Freq: Every evening | ORAL | Status: DC | PRN

## 2019-12-11 MED ORDER — LISINOPRIL 5 MG PO TABS
2.5000 mg | ORAL_TABLET | Freq: Every day | ORAL | Status: DC
  Administered 2019-12-12 – 2019-12-16 (×5): 2.5 mg via ORAL
  Filled 2019-12-11 (×5): qty 1

## 2019-12-11 MED ORDER — SODIUM CHLORIDE 0.9 % IV SOLN: 250.0000 mL | INTRAVENOUS | Status: DC | PRN

## 2019-12-11 MED ORDER — MAGNESIUM OXIDE 400 (241.3 MG) MG PO TABS
400.0000 mg | ORAL_TABLET | ORAL | Status: DC
  Administered 2019-12-12 – 2019-12-18 (×13): 400 mg via ORAL
  Filled 2019-12-11 (×13): qty 1

## 2019-12-11 MED ORDER — APIXABAN 5 MG PO TABS
5.0000 mg | ORAL_TABLET | Freq: Two times a day (BID) | ORAL | Status: DC
  Administered 2019-12-11 – 2019-12-18 (×14): 5 mg via ORAL
  Filled 2019-12-11 (×15): qty 1

## 2019-12-11 MED ORDER — ACETAMINOPHEN 325 MG PO TABS
650.0000 mg | ORAL_TABLET | ORAL | Status: DC | PRN
  Administered 2019-12-12: 650 mg via ORAL
  Filled 2019-12-11: qty 2

## 2019-12-11 MED ORDER — FUROSEMIDE 10 MG/ML IJ SOLN
40.0000 mg | Freq: Once | INTRAMUSCULAR | Status: AC
  Administered 2019-12-11: 40 mg via INTRAVENOUS
  Filled 2019-12-11: qty 4

## 2019-12-11 MED ORDER — ATORVASTATIN CALCIUM 10 MG PO TABS
20.0000 mg | ORAL_TABLET | Freq: Every day | ORAL | Status: DC
  Administered 2019-12-12 – 2019-12-17 (×6): 20 mg via ORAL
  Filled 2019-12-11 (×6): qty 2

## 2019-12-11 MED ORDER — SODIUM CHLORIDE 0.9% FLUSH: 3.0000 mL | INTRAVENOUS | Status: DC | PRN

## 2019-12-11 MED ORDER — MELATONIN 3 MG PO TABS
4.5000 mg | ORAL_TABLET | Freq: Every day | ORAL | Status: DC
  Administered 2019-12-11 – 2019-12-17 (×7): 4.5 mg via ORAL
  Filled 2019-12-11 (×8): qty 1.5

## 2019-12-11 NOTE — ED Notes (Signed)
Debora stokes-daughter- [phone]- would like pt updates.

## 2019-12-11 NOTE — H&P (Signed)
History and Physical   [employer] XNA:[id number] DOB: February 14, 1929 DOA: 12/11/2019  Referring MD/NP/PA: Dr. Roslynn Amble  PCP: Caren Macadam, MD   Outpatient Specialists: Dr. Crissie Sickles, cardiology  Patient coming from: Home  Chief Complaint: Shortness of breath  HPI: [name] [name] is a 86 y.o. male with medical history significant of systolic dysfunction CHF, nonischemic ischemic cardiomyopathy, pacemaker placement, hypertension, diabetes, hyperlipidemia, frequent PVCs who came to the ER with progressive lower extremity edema and worsening exertional dyspnea.  Patient is on medications including ACE inhibitor and diuretics.  He has been taking his medications as prescribed.  He has noted gradual worsening of exertional dyspnea and lower extremity edema.  He came to the ER where he was seen and evaluated.  Patient appears to have significant exacerbation of his CHF.  He is being admitted with acute on chronic CHF.  His last echocardiogram in the system was from 2019 with a EF of 40 to 45%.  He denied any chest pain.  Denied any orthopnea but some PND.[name]  ED Course: Temperature is 97.9 blood pressure 124/76 pulse 118 respiratory rate of 30 oxygen sats 93% on room air.  Chemistry shows glucose of 161 otherwise creatinine 1.23.  Albumin 3.4.  Total bilirubin of 1.3.  BNP 1035.  CBC showed a hemoglobin 12.6 and platelets 136.  Social head the airspace opacity within the right middle lower lung fields suspicious for pneumonia with trace bilateral pleural effusions.  Patient has no other symptoms of pneumonia.  Being admitted to the hospital for work-up.  Review of Systems: As per HPI otherwise 10 point review of systems negative.    Past Medical History:  Diagnosis Date  . Anemia   . Atrial fibrillation ([place])   . BPH (benign prostatic hyperplasia)   . Bradycardia   . Cardiomyopathy   . CHF (congestive heart failure) ([place])   . Diabetes mellitus type II   . GERD  (gastroesophageal reflux disease)   . History of colonoscopy   . HTN (hypertension)   . Hyperlipidemia   . ICD (implantable cardiac defibrillator) in place 10/14/2012   biventricular  . OA (osteoarthritis)   . Obesity   . Peptic ulcer     Past Surgical History:  Procedure Laterality Date  . EP IMPLANTABLE DEVICE Left   . EP IMPLANTABLE DEVICE N/A 07/27/2016   Procedure: BIV Pacemaker downgrade;  Surgeon: Evans Lance, MD;  Location: [facility];  Service: Cardiovascular;  Laterality: N/A;  . ESOPHAGOGASTRODUODENOSCOPY  06/24/2002  . L-spine  1965  . Lumbar L4-5 & S1  02/2000  . TOTAL KNEE ARTHROPLASTY  1997   right  . TOTAL KNEE ARTHROPLASTY Left 02/15/2015   Procedure: LEFT TOTAL KNEE ARTHROPLASTY;  Surgeon: Paralee Cancel, MD;  Location: [facility];  Service: Orthopedics;  Laterality: Left;     reports that he has never smoked. He has never used smokeless tobacco. He reports that he does not drink alcohol or use drugs.  No Known Allergies  Family History  Problem Relation Age of Onset  . Heart disease Father   . Heart disease Mother   . Diabetes Mother   . Hypertension Brother   . Hypertension Brother   . Cancer Neg Hx      Prior to Admission medications   Medication Sig Start Date End Date Taking? Authorizing Provider  acetaminophen (TYLENOL) 650 MG CR tablet Take 650-1,300 mg by mouth every 8 (eight) hours as needed for pain.    [provider]  allopurinol (ZYLOPRIM) 100 MG tablet TAKE 1 TABLET EVERY DAY 10/27/19   Koberlein, Jannette Spanner C, MD  apixaban (ELIQUIS) 5 MG TABS tablet TAKE 1 TABLET(5 MG) BY MOUTH TWICE DAILY 09/02/19   Koberlein, Jannette Spanner C, MD  atorvastatin (LIPITOR) 20 MG tablet TAKE 1 TABLET EVERY DAY 08/25/19   Lewayne Bunting, MD  cholecalciferol (VITAMIN D) 1000 units tablet Take 1,000 Units by mouth daily.    [provider]  donepezil (ARICEPT) 5 MG tablet TAKE 1 TABLET  BY MOUTH AT BEDTIME. 12/07/19   Koberlein, Jannette Spanner C, MD    furosemide (LASIX) 40 MG tablet TAKE 2 TABLETS EVERY DAY 10/26/19   Lewayne Bunting, MD  lisinopril (ZESTRIL) 2.5 MG tablet TAKE 1 TABLET EVERY DAY (KEEP AUGUST APPOINTMENT) 08/25/19   Lewayne Bunting, MD  magnesium oxide (MAG-OX) 400 (241.3 Mg) MG tablet TAKE 1 TABLET (400 MG TOTAL) BY MOUTH 2 (TWO) TIMES DAILY. 08/26/18   Lewayne Bunting, MD  MELATONIN PO Take by mouth at bedtime. Takes a time release    [provider]  polyethylene glycol (MIRALAX / GLYCOLAX) packet Take 17 g by mouth daily as needed (constipation).    [provider]  potassium chloride SA (K-DUR) 20 MEQ tablet TAKE 1 TABLET EVERY DAY 07/23/19   Lewayne Bunting, MD  tamsulosin (FLOMAX) 0.4 MG CAPS capsule TAKE 1 CAPSULE TWICE DAILY 11/20/19   Wynn Banker, MD    Physical Exam: Vitals:   12/11/19 1700 12/11/19 1730 12/11/19 1800 12/11/19 1830  BP: (!) 109/97 (!) 122/108 123/70 114/74  Pulse: (!) 108 67 (!) 53 (!) 53  Resp: (!) 24 (!) 25 (!) 27 19  Temp:      SpO2: 99% 98% 99% 96%  Weight:      Height:          Constitutional: Acutely ill looking mildly anxious Vitals:   12/11/19 1700 12/11/19 1730 12/11/19 1800 12/11/19 1830  BP: (!) 109/97 (!) 122/108 123/70 114/74  Pulse: (!) 108 67 (!) 53 (!) 53  Resp: (!) 24 (!) 25 (!) 27 19  Temp:      SpO2: 99% 98% 99% 96%  Weight:      Height:       Eyes: PERRL, lids and conjunctivae normal ENMT: Mucous membranes are moist. Posterior pharynx clear of any exudate or lesions.Normal dentition.  Neck: normal, supple, no masses, no thyromegaly Respiratory: Fair air entry bilaterally with crackles and some rhonchi, no wheezing normal respiratory effort. No accessory muscle use.  Cardiovascular: Irregularly irregular rate and rhythm no murmurs / rubs / gallops.  2+ pedal edema with pitting,. 2+ pedal pulses. No carotid bruits.  Abdomen: no tenderness, no masses palpated. No hepatosplenomegaly. Bowel sounds positive.  Musculoskeletal: no  clubbing / cyanosis. No joint deformity upper and lower extremities. Good ROM, no contractures. Normal muscle tone.  Skin: no rashes, lesions, ulcers. No induration Neurologic: CN 2-12 grossly intact. Sensation intact, DTR normal. Strength 5/5 in all 4.  Psychiatric: Normal judgment and insight. Alert and oriented x 3. Normal mood.     Labs on Admission: I have personally reviewed following labs and imaging studies  CBC: Recent Labs  Lab 12/07/19 1655 12/11/19 1245  WBC 7.8 8.5  NEUTROABS 5.8  --   HGB 12.4* 12.6*  HCT 38.0* 38.1*  MCV 96.7 96.2  PLT 125.0* 136*   Basic Metabolic Panel: Recent Labs  Lab 12/07/19 1655 12/11/19 1245  NA 138 141  K 3.9 4.1  CL 101 103  CO2 25 25  GLUCOSE 152* 161*  BUN 26* 34*  CREATININE 1.03 1.23  CALCIUM 9.3 9.4   GFR: Estimated Creatinine Clearance: 48 mL/min (by C-G formula based on SCr of 1.23 mg/dL). Liver Function Tests: Recent Labs  Lab 12/11/19 1338  AST 28  ALT 31  ALKPHOS 83  BILITOT 1.3*  PROT 6.4*  ALBUMIN 3.4*   No results for input(s): LIPASE, AMYLASE in the last 168 hours. No results for input(s): AMMONIA in the last 168 hours. Coagulation Profile: No results for input(s): INR, PROTIME in the last 168 hours. Cardiac Enzymes: No results for input(s): CKTOTAL, CKMB, CKMBINDEX, TROPONINI in the last 168 hours. BNP (last 3 results) No results for input(s): PROBNP in the last 8760 hours. HbA1C: No results for input(s): HGBA1C in the last 72 hours. CBG: No results for input(s): GLUCAP in the last 168 hours. Lipid Profile: No results for input(s): CHOL, HDL, LDLCALC, TRIG, CHOLHDL, LDLDIRECT in the last 72 hours. Thyroid Function Tests: No results for input(s): TSH, T4TOTAL, FREET4, T3FREE, THYROIDAB in the last 72 hours. Anemia Panel: No results for input(s): VITAMINB12, FOLATE, FERRITIN, TIBC, IRON, RETICCTPCT in the last 72 hours. Urine analysis:    Component Value Date/Time   COLORURINE YELLOW 09/26/2015  1424   APPEARANCEUR CLEAR 09/26/2015 1424   LABSPEC 1.015 09/26/2015 1424   PHURINE 5.5 09/26/2015 1424   GLUCOSEU NEGATIVE 09/26/2015 1424   HGBUR NEGATIVE 09/26/2015 1424   BILIRUBINUR NEGATIVE 09/26/2015 1424   KETONESUR NEGATIVE 09/26/2015 1424   PROTEINUR NEGATIVE 02/08/2015 0900   UROBILINOGEN 0.2 09/26/2015 1424   NITRITE NEGATIVE 09/26/2015 1424   LEUKOCYTESUR NEGATIVE 09/26/2015 1424   Sepsis Labs: @LABRCNTIP (procalcitonin:4,lacticidven:4) )No results found for this or any previous visit (from the past 240 hour(s)).   Radiological Exams on Admission: DG Chest Portable 1 View  Result Date: 12/11/2019 CLINICAL DATA:  Shortness of breath EXAM: PORTABLE CHEST 1 VIEW COMPARISON:  02/19/2018 FINDINGS: Stable positioning of a left-sided implanted cardiac device. Heart size remains enlarged. Calcific aortic knob. Hazy airspace opacities most pronounced within the peripheral aspect of the right mid and lower lung fields. Possible trace bilateral pleural effusions. No pneumothorax. Severe degenerative changes of the bilateral shoulders. IMPRESSION: 1. Hazy airspace opacity within the right mid and lower lung fields, suspicious for pneumonia. 2. Possible trace bilateral pleural effusions. 3. Stable cardiomegaly. Electronically Signed   By: 02/21/2018 D.O.   On: 12/11/2019 13:27    EKG: Independently reviewed.  It shows atrial fibrillation with a rate of 117, right bundle branch block.  Multiple PVCs visible.  Assessment/Plan Principal Problem:   Acute on chronic combined systolic and diastolic CHF (congestive heart failure) (HCC) Active Problems:   DM (diabetes mellitus) type II controlled with renal manifestation ([facility])   Nephropathy, diabetic (H[facility])   Dyslipidemia   Essential hypertension   MYOCARDIAL INFARCTION, HX OF   Atrial fibrillation (H[facility])   Edema   Long term (current) use of anticoagulants   Systolic CHF, chronic (HCC)   Hyperlipidemia   GERD (gastroesophageal reflux  disease)   Bradycardia     #1 acute on chronic systolic dysfunction CHF: Patient will be admitted.  I will switch Lasix to IV route.  Repeat echocardiogram since he has been more than a year that he had it done.  Monitor response closely.  Continue other cardiac medication including ACE inhibitor and beta-blockers.  May need cardiology consult in the morning.  #2 diabetes: Sliding scale insulin.  Monitor closely.  #  3 atrial fibrillation with rapid response: Resume rate control.  Continue anticoagulation.  #4 essential hypertension: Continue home regimen.  Blood pressure is soft at the moment.  We will be careful with treatment.  #5 hyperlipidemia: Continue with statin.  #6 GERD: Continue with PPIs  DVT prophylaxis: Eliquis Code Status: Full code Family Communication: No family at bedside Disposition Plan: To be determined Consults called: None Admission status: Inpatient  Severity of Illness: The appropriate patient status for this patient is INPATIENT. Inpatient status is judged to be reasonable and necessary in order to provide the required intensity of service to ensure the patient's safety. The patient's presenting symptoms, physical exam findings, and initial radiographic and laboratory data in the context of their chronic comorbidities is felt to place them at high risk for further clinical deterioration. Furthermore, it is not anticipated that the patient will be medically stable for discharge from the hospital within 2 midnights of admission. The following factors support the patient status of inpatient.   " The patient's presenting symptoms include shortness of breath and increasing lower extremity edema. " The worrisome physical exam findings include lower extremity edema. " The initial radiographic and laboratory data are worrisome because of increased BNP and chest x-ray findings. " The chronic co-morbidities include CHF.   * I certify that at the point of admission it is  my clinical judgment that the patient will require inpatient hospital care spanning beyond 2 midnights from the point of admission due to high intensity of service, high risk for further deterioration and high frequency of surveillance required.Lonia Blood MD [facility] Pager [phone]  If 7PM-7AM, please contact night-coverage [email or web address] Password [email or web address]  12/11/2019, 7:07 PM

## 2019-12-11 NOTE — ED Notes (Signed)
Report attempted, 3E unable to take at this time 

## 2019-12-11 NOTE — ED Provider Notes (Addendum)
[facility] EMERGENCY DEPARTMENT Provider Note   CSN: [id number] Arrival date & time: 12/11/19  1206     History Chief Complaint  Patient presents with  . Shortness of Breath    Alby Council Busche is a 86 y.o. male.  Patient is an 86 year old male with a history of cardiomyopathy, CHF, hypertension, hyperlipidemia, diabetes, prior pacemaker placement with frequent PVCs who is presenting today with worsening shortness of breath.  Patient states for some time he has had intermittent shortness of breath that seems to be worse with exertion and when laying down too far on his recliner.  However today he states he became severe.  He was up getting his breakfast and then started to feel very short of breath and had to sit down.  He was very hot so he put his head in the freezer to try to cool down.  He says the shortness of breath is better than it was but he is still feeling short of breath.  He denies cough, fever, sputum production, chest pain or abdominal pain.  However for some time now he has had worsening fatigue and sweating which he thinks is usually when he is trying to do something.  He saw his doctor recently for similar symptoms and they were checking lab work to further evaluate and rule out other abnormality such as carcinoid syndrome.  Patient is checking his weight regularly and states that he is only fluctuated a few pounds.  He has not missed any doses of his diuretic and denies any recent changes in his medications.  He always has leg swelling but does not think it has gotten any worse.  The history is provided by the patient.  Shortness of Breath Severity:  Severe Onset quality:  Gradual Timing:  Intermittent Progression:  Waxing and waning Chronicity:  New      Past Medical History:  Diagnosis Date  . Anemia   . Atrial fibrillation (HCC)   . BPH (benign prostatic hyperplasia)   . Bradycardia   . Cardiomyopathy   . CHF (congestive heart  failure) (HCC)   . Diabetes mellitus type II   . GERD (gastroesophageal reflux disease)   . History of colonoscopy   . HTN (hypertension)   . Hyperlipidemia   . ICD (implantable cardiac defibrillator) in place 10/14/2012   biventricular  . OA (osteoarthritis)   . Obesity   . Peptic ulcer     Patient Active Problem List   Diagnosis Date Noted  . 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 Bigeminy 02/23/2018  . Obesity   . OA (osteoarthritis)   . Hyperlipidemia   . History of colonoscopy   . GERD (gastroesophageal reflux disease)   . Bradycardia   . BPH (benign prostatic hyperplasia)   . Hearing loss 01/11/2017  . Vitamin D deficiency 06/29/2015  . Systolic CHF, chronic (HCC) 02/21/2015  . S/P left TKA 02/15/2015  . S/P knee replacement 02/15/2015  . Encounter for therapeutic drug monitoring 03/31/2014  . Automatic implantable cardioverter-defibrillator in situ 10/16/2012  . Disorder of liver 04/14/2012  . Screening for prostate cancer 04/11/2012  . Encounter for long-term (current) use of other medications 04/11/2012  . Long term (current) use of anticoagulants 02/25/2012  . DM (diabetes mellitus) type II controlled with renal manifestation (HCC) 10/18/2010  . MEMORY LOSS 08/30/2010  . HYPOKALEMIA 07/31/2010  . Thrombocytopenia (HCC) 06/20/2009  . Hyperuricemia 06/20/2009  . OTHER PRIMARY CARDIOMYOPATHIES 04/29/2009  . Dyslipidemia 02/21/2009  . BRADYCARDIA 02/21/2009  .  CHRON/UNSPEC PEPTC ULCER UNSPEC SITE W/PERF&OBST 02/21/2009  . EDEMA 02/21/2009  . Nephropathy, diabetic ([place]) 03/16/2008  . Atrial fibrillation ([place]) 03/16/2008  . Anemia, iron deficiency 08/09/2007  . Essential hypertension 08/09/2007  . MYOCARDIAL INFARCTION, HX OF 08/09/2007    Past Surgical History:  Procedure Laterality Date  . EP IMPLANTABLE DEVICE Left   . EP IMPLANTABLE DEVICE N/A 07/27/2016   Procedure: BIV Pacemaker downgrade;  Surgeon: Evans Lance, MD;  Location: [place] [facility];  Service:  Cardiovascular;  Laterality: N/A;  . ESOPHAGOGASTRODUODENOSCOPY  06/24/2002  . L-spine  1965  . Lumbar L4-5 & S1  02/2000  . TOTAL KNEE ARTHROPLASTY  1997   right  . TOTAL KNEE ARTHROPLASTY Left 02/15/2015   Procedure: LEFT TOTAL KNEE ARTHROPLASTY;  Surgeon: Paralee Cancel, MD;  Location: [facility];  Service: Orthopedics;  Laterality: Left;       Family History  Problem Relation Age of Onset  . Heart disease Father   . Heart disease Mother   . Diabetes Mother   . Hypertension Brother   . Hypertension Brother   . Cancer Neg Hx     Social History   Tobacco Use  . Smoking status: Never Smoker  . Smokeless tobacco: Never Used  Substance Use Topics  . Alcohol use: No  . Drug use: No    Home Medications Prior to Admission medications   Medication Sig Start Date End Date Taking? Authorizing Provider  acetaminophen (TYLENOL) 650 MG CR tablet Take 650-1,300 mg by mouth every 8 (eight) hours as needed for pain.    [provider]  allopurinol (ZYLOPRIM) 100 MG tablet TAKE 1 TABLET EVERY DAY 10/27/19   Koberlein, Junell C, MD  apixaban (ELIQUIS) 5 MG TABS tablet TAKE 1 TABLET(5 MG) BY MOUTH TWICE DAILY 09/02/19   Koberlein, Andris Flurry C, MD  atorvastatin (LIPITOR) 20 MG tablet TAKE 1 TABLET EVERY DAY 08/25/19   Lelon Perla, MD  cholecalciferol (VITAMIN D) 1000 units tablet Take 1,000 Units by mouth daily.    [provider]  donepezil (ARICEPT) 5 MG tablet TAKE 1 TABLET  BY MOUTH AT BEDTIME. 12/07/19   Koberlein, Andris Flurry C, MD  furosemide (LASIX) 40 MG tablet TAKE 2 TABLETS EVERY DAY 10/26/19   Lelon Perla, MD  lisinopril (ZESTRIL) 2.5 MG tablet TAKE 1 TABLET EVERY DAY (KEEP AUGUST APPOINTMENT) 08/25/19   Lelon Perla, MD  magnesium oxide (MAG-OX) 400 (241.3 Mg) MG tablet TAKE 1 TABLET (400 MG TOTAL) BY MOUTH 2 (TWO) TIMES DAILY. 08/26/18   Lelon Perla, MD  MELATONIN PO Take by mouth at bedtime. Takes a time release    [provider]  polyethylene  glycol (MIRALAX / GLYCOLAX) packet Take 17 g by mouth daily as needed (constipation).    [provider]  potassium chloride SA (K-DUR) 20 MEQ tablet TAKE 1 TABLET EVERY DAY 07/23/19   Lelon Perla, MD  tamsulosin (FLOMAX) 0.4 MG CAPS capsule TAKE 1 CAPSULE TWICE DAILY 11/20/19   Caren Macadam, MD    Allergies    Patient has no known allergies.  Review of Systems   Review of Systems  Respiratory: Positive for shortness of breath.   All other systems reviewed and are negative.   Physical Exam Updated Vital Signs BP 118/87   Pulse (!) 54   Temp 97.9 F (36.6 C)   Resp (!) 23   Ht 5\' 8"  (1.727 m)   Wt 101.6 kg   SpO2 98%  BMI 34.06 kg/m   Physical Exam Vitals and nursing note reviewed.  Constitutional:      General: He is not in acute distress.    Appearance: He is well-developed. He is obese.  HENT:     Head: Normocephalic and atraumatic.  Eyes:     Conjunctiva/sclera: Conjunctivae normal.     Pupils: Pupils are equal, round, and reactive to light.  Cardiovascular:     Rate and Rhythm: Tachycardia present. Rhythm irregular.     Pulses: Normal pulses.     Heart sounds: No murmur.  Pulmonary:     Effort: Tachypnea and accessory muscle usage present. No respiratory distress.     Breath sounds: Rales present. No wheezing.  Abdominal:     General: There is no distension.     Palpations: Abdomen is soft.     Tenderness: There is no abdominal tenderness. There is no guarding or rebound.  Musculoskeletal:        General: No tenderness. Normal range of motion.     Cervical back: Normal range of motion and neck supple.     Right lower leg: Edema present.     Left lower leg: Edema present.  Skin:    General: Skin is warm and dry.     Capillary Refill: Capillary refill takes less than 2 seconds.     Findings: No erythema or rash.  Neurological:     General: No focal deficit present.     Mental Status: He is alert and oriented to person, place, and  time.  Psychiatric:        Mood and Affect: Mood normal.        Behavior: Behavior normal.     ED Results / Procedures / Treatments   Labs (all labs ordered are listed, but only abnormal results are displayed) Labs Reviewed  BASIC METABOLIC PANEL - Abnormal; Notable for the following components:      Result Value   Glucose, Bld 161 (*)    BUN 34 (*)    GFR calc non Af Amer 52 (*)    All other components within normal limits  CBC - Abnormal; Notable for the following components:   RBC 3.96 (*)    Hemoglobin 12.6 (*)    HCT 38.1 (*)    Platelets 136 (*)    All other components within normal limits  HEPATIC FUNCTION PANEL - Abnormal; Notable for the following components:   Total Protein 6.4 (*)    Albumin 3.4 (*)    Total Bilirubin 1.3 (*)    Bilirubin, Direct 0.4 (*)    All other components within normal limits  TROPONIN I (HIGH SENSITIVITY) - Abnormal; Notable for the following components:   Troponin I (High Sensitivity) 41 (*)    All other components within normal limits  BRAIN NATRIURETIC PEPTIDE  TROPONIN I (HIGH SENSITIVITY)    EKG EKG Interpretation  Date/Time:  Friday December 11 2019 12:13:16 EST Ventricular Rate:  129 PR Interval:    QRS Duration: 136 QT Interval:  346 QTC Calculation: 506 R Axis:   -44 Text Interpretation: Undetermined rhythm  tachycardic rhythm Left axis deviation Non-specific intra-ventricular conduction block T wave abnormality, consider inferior ischemia T wave abnormality, consider anterolateral ischemia Abnormal ECG Confirmed by Gwyneth Sprout ([id number]) on 12/11/2019 1:02:11 PM   Radiology DG Chest Portable 1 View  Result Date: 12/11/2019 CLINICAL DATA:  Shortness of breath EXAM: PORTABLE CHEST 1 VIEW COMPARISON:  02/19/2018 FINDINGS: Stable positioning of a left-sided implanted cardiac device.  Heart size remains enlarged. Calcific aortic knob. Hazy airspace opacities most pronounced within the peripheral aspect of the right mid and  lower lung fields. Possible trace bilateral pleural effusions. No pneumothorax. Severe degenerative changes of the bilateral shoulders. IMPRESSION: 1. Hazy airspace opacity within the right mid and lower lung fields, suspicious for pneumonia. 2. Possible trace bilateral pleural effusions. 3. Stable cardiomegaly. Electronically Signed   By: Duanne Guess D.O.   On: 12/11/2019 13:27    Procedures Procedures (including critical care time)  Medications Ordered in ED Medications  furosemide (LASIX) injection 40 mg (40 mg Intravenous Given 12/11/19 1410)    ED Course  I have reviewed the triage vital signs and the nursing notes.  Pertinent labs & imaging results that were available during my care of the patient were reviewed by me and considered in my medical decision making (see chart for details).    MDM Rules/Calculators/A&P                      Elderly male with multiple medical problems presenting today with worsening shortness of breath.  Sounds like patient has had intermittent issues for a while but it was much worse this morning.  He denies any chest pain but recently even saw his doctor for exertional fatigue, flushing and needing to sit down and rest.  Concern for CHF exacerbation today.  Lower suspicion for PE, dissection or acute abdominal pathology.  Patient does have lower extremity edema and rales bilaterally.  Last echo was 02/19/2018 at which time his EF was 40 to 45%.  He denies any change in his medications and denies missing any doses of his Lasix which is 40 mg twice daily.  He is not having any chest pain however patient is a paced rhythm with frequent ectopic beats.  [employer] pacemaker was interrogated and he has had a run of V. tach yesterday in 1 to 2-second runs of V. tach today but nothing consistently.  Renal function is unchanged today and CBC shows normal white count and stable hemoglobin.  Chest x-ray shows hazy airspace opacity within the right middle and lower lung  field suspicious for possible pneumonia but also has pleural effusion.  Patient does not displaying symptoms consistent with pneumonia such as productive cough, fever but he does state he gets flushing intermittently.  Still suspect this is most likely related to CHF.  BNP and troponin are pending.  Patient was given a dose of IV Lasix.  He denies any Covid contacts.  He is currently satting 98% but respiratory rate varies between 20-30.  He does not wear oxygen at home.  Patient with no infectious etiology.  He again denies any productive cough he has no white count and he denies fever.  Final Clinical Impression(s) / ED Diagnoses Final diagnoses:  None    Rx / DC Orders ED Discharge Orders    None       Gwyneth Sprout, MD 12/11/19 1545    Gwyneth Sprout, MD 12/11/19 1546

## 2019-12-11 NOTE — ED Provider Notes (Signed)
Sign out note  Summary: 86 y/o male with cardiomyopathy, CHF, hypertension, hyperlipidemia, diabetes, prior pacemaker placement with frequent PVCs who is presenting today with worsening shortness of breath.  No hypoxia, but does have lower extremity edema and tachypnea.  Concern for heart failure exacerbation.  Given dose of Lasix.  BNP pending.  Once BMP and repeat troponin have resulted, consult medicine for admission.  4:55 PM Received signout from Dr. Anitra Lauth  BNP >1000, will c/s hosp for admit  6:46 PM discussed with Mikeal Hawthorne, will accept to [facility] service   Milagros Loll, MD 12/11/19 [phone]

## 2019-12-11 NOTE — Progress Notes (Signed)
CBG 133 

## 2019-12-11 NOTE — ED Triage Notes (Signed)
Pt to ER for evaluation of shortness of breath. Patient reports difficulty breathing, wife reports he has been not feeling well for 1.5 weeks with hot flashes that suddenly come on and then subside. Wheezing is audible. He is able to speak in fragmented sentences but is a.o x4. Has gained 4 lbs in 4 days, reports history of CHF.

## 2019-12-11 NOTE — Progress Notes (Signed)
Notify provider pt has 6 beas of v tach, v/s stable. Rest in bed, follow up new order.

## 2019-12-12 ENCOUNTER — Inpatient Hospital Stay (HOSPITAL_COMMUNITY): Payer: Medicare HMO

## 2019-12-12 DIAGNOSIS — I361 Nonrheumatic tricuspid (valve) insufficiency: Secondary | ICD-10-CM

## 2019-12-12 DIAGNOSIS — I340 Nonrheumatic mitral (valve) insufficiency: Secondary | ICD-10-CM

## 2019-12-12 LAB — GLUCOSE, CAPILLARY
Glucose-Capillary: 135 mg/dL — ABNORMAL HIGH (ref 70–99)
Glucose-Capillary: 154 mg/dL — ABNORMAL HIGH (ref 70–99)
Glucose-Capillary: 126 mg/dL — ABNORMAL HIGH (ref 70–99)

## 2019-12-12 LAB — BASIC METABOLIC PANEL
Anion gap: 11 (ref 5–15)
BUN: 32 mg/dL — ABNORMAL HIGH (ref 8–23)
CO2: 25 mmol/L (ref 22–32)
Calcium: 8.9 mg/dL (ref 8.9–10.3)
Chloride: 105 mmol/L (ref 98–111)
Creatinine, Ser: 1.08 mg/dL (ref 0.61–1.24)
GFR calc Af Amer: >60 mL/min (ref >60)
GFR calc non Af Amer: >60 mL/min (ref >60)
Glucose, Bld: 151 mg/dL — ABNORMAL HIGH (ref 70–99)
Potassium: 3.5 mmol/L (ref 3.5–5.1)
Sodium: 141 mmol/L (ref 135–145)

## 2019-12-12 LAB — ECHOCARDIOGRAM COMPLETE
Height: 68 in
Weight: 3499.14 oz

## 2019-12-12 LAB — CBC WITH DIFFERENTIAL/PLATELET
Abs Immature Granulocytes: 0.06 10*3/uL (ref 0.00–0.07)
Basophils Absolute: 0 10*3/uL (ref 0.0–0.1)
Basophils Relative: 1 %
Eosinophils Absolute: 0.1 10*3/uL (ref 0.0–0.5)
Eosinophils Relative: 1 %
HCT: 35.7 % — ABNORMAL LOW (ref 39.0–52.0)
Hemoglobin: 11.8 g/dL — ABNORMAL LOW (ref 13.0–17.0)
Immature Granulocytes: 1 %
Lymphocytes Relative: 19 %
Lymphs Abs: 1.4 10*3/uL (ref 0.7–4.0)
MCH: 31.7 pg (ref 26.0–34.0)
MCHC: 33.1 g/dL (ref 30.0–36.0)
MCV: 96 fL (ref 80.0–100.0)
Monocytes Absolute: 0.9 10*3/uL (ref 0.1–1.0)
Monocytes Relative: 12 %
Neutro Abs: 4.8 10*3/uL (ref 1.7–7.7)
Neutrophils Relative %: 66 %
Platelets: 122 10*3/uL — ABNORMAL LOW (ref 150–400)
RBC: 3.72 MIL/uL — ABNORMAL LOW (ref 4.22–5.81)
RDW: 14.5 % (ref 11.5–15.5)
WBC: 7.3 10*3/uL (ref 4.0–10.5)
nRBC: 0 % (ref 0.0–0.2)

## 2019-12-12 LAB — HEMOGLOBIN A1C
Hgb A1c MFr Bld: 7 % — ABNORMAL HIGH (ref 4.8–5.6)
Mean Plasma Glucose: 154.2 mg/dL

## 2019-12-12 MED ORDER — SODIUM CHLORIDE 0.9 % IV SOLN
2.0000 g | INTRAVENOUS | Status: DC
  Administered 2019-12-13 – 2019-12-14 (×2): 2 g via INTRAVENOUS
  Filled 2019-12-12: qty 20
  Filled 2019-12-12 (×2): qty 2

## 2019-12-12 MED ORDER — PERFLUTREN LIPID MICROSPHERE
1.0000 mL | INTRAVENOUS | Status: AC | PRN
  Administered 2019-12-12: 14:00:00 2 mL via INTRAVENOUS
  Filled 2019-12-12: qty 10

## 2019-12-12 MED ORDER — FUROSEMIDE 10 MG/ML IJ SOLN
20.0000 mg | Freq: Once | INTRAMUSCULAR | Status: AC
  Administered 2019-12-12: 20 mg via INTRAVENOUS
  Filled 2019-12-12: qty 2

## 2019-12-12 MED ORDER — SODIUM CHLORIDE 0.9 % IV SOLN
1.0000 g | INTRAVENOUS | Status: DC
  Administered 2019-12-12: 13:00:00 1 g via INTRAVENOUS
  Filled 2019-12-12: qty 10

## 2019-12-12 NOTE — Progress Notes (Signed)
PROGRESS NOTE  [employer] WUJ:[id number] DOB: 1929-04-02 DOA: 12/11/2019 PCP: Wynn Banker, MD  HPI/Recap of past 24 hours: Per HPI by Dr. Mikeal Hawthorne: [name] is a 86 y.o. male with medical history significant of systolic dysfunction CHF, nonischemic ischemic cardiomyopathy, pacemaker placement, hypertension, diabetes, hyperlipidemia, frequent PVCs who came to the ER with progressive lower extremity edema and worsening exertional dyspnea.  Patient is on medications including ACE inhibitor and diuretics.  He has been taking his medications as prescribed.  He has noted gradual worsening of exertional dyspnea and lower extremity edema.  He came to the ER where he was seen and evaluated.  Patient appears to have significant exacerbation of his CHF.  He is being admitted with acute on chronic CHF.  His last echocardiogram in the system was from 2019 with a EF of 40 to 45%.  He denied any chest pain.  Denied any orthopnea but some PND.  Patient had a hemoglobin of 12.6 WBC of 7.  Patient was admitted for CHF he does have airspace opacity within the right middle lobe lung suspicious for pneumonia with trace bilateral pleural effusion.  December 12, 2019 Subjective: Patient seen and examined at bedside.  Nurse reported patient was short of breath and wheezing his vital signs was normal but she increased O2 to 2 L/min and was saturating at 95%.  Repeat chest x-ray was ordered also I ordered an additional 20 mg of Lasix.]  And return to evaluate patient patient said he felt better but then he was hot and was finding himself.  Repeat chest x-ray showed airspace disease possible pneumonia patient was therefore started on Rocephin.  Patient has atrial fibrillation proximal chronic on apixaban   Assessment/Plan: Principal Problem:   Acute on chronic combined systolic and diastolic CHF (congestive heart failure) (HCC) Active Problems:   DM (diabetes mellitus) type II controlled  with renal manifestation (HCC)   Nephropathy, diabetic (HCC)   Dyslipidemia   Essential hypertension   MYOCARDIAL INFARCTION, HX OF   Atrial fibrillation (HCC)   Edema   Long term (current) use of anticoagulants   Systolic CHF, chronic (HCC)   Hyperlipidemia   GERD (gastroesophageal reflux disease)   Bradycardia #1 acute on chronic systolic dysfunction.  Patient was given extra Lasix 20 mg this morning due to complaint of shortness of breath with wheezing per nurse.  Chest x-ray showed pleural effusion and airspace disease.  2.  Airspace disease/pneumonia patient will be started on Rocephin  3.  Chronic atrial fibrillation with rapid ventricular response rate controlled continue apixaban  4.  Hyperlipidemia continue statin  5.  GERD continue PPI  6.  Diabetes mellitus continue sliding scale insulin  7.  Essential hypertension blood pressure is well controlled  Code Status: Full  Severity of Illness: The appropriate patient status for this patient is INPATIENT. Inpatient status is judged to be reasonable and necessary in order to provide the required intensity of service to ensure the patient's safety. The patient's presenting symptoms, physical exam findings, and initial radiographic and laboratory data in the context of their chronic comorbidities is felt to place them at high risk for further clinical deterioration. Furthermore, it is not anticipated that the patient will be medically stable for discharge from the hospital within 2 midnights of admission. The following factors support the patient status of inpatient.   " The patient's presenting symptoms include shortness of breath. " The worrisome physical exam findings include heart failure with possible pneumonia. " The  initial radiographic and laboratory data are worrisome because of failure with pneumonia. " The chronic co-morbidities include edema.   * I certify that at the point of admission it is my clinical judgment  that the patient will require inpatient hospital care spanning beyond 2 midnights from the point of admission due to high intensity of service, high risk for further deterioration and high frequency of surveillance required.*    Family Communication: None at bedside  Disposition Plan: Home when stable   Consultants:  None  Procedures:  2D echo  Antimicrobials:  Rocephin  DVT prophylaxis: Apixaban   Objective: Vitals:   12/12/19 0140 12/12/19 0407 12/12/19 0409 12/12/19 0807  BP: (!) 127/99 108/70  120/81  Pulse: 93 (!) 53  64  Resp: 20 20    Temp: 97.9 F (36.6 C) 97.7 F (36.5 C)    TempSrc: Oral Oral    SpO2: 94% 94%  100%  Weight:   99.2 kg   Height:        Intake/Output Summary (Last 24 hours) at 12/12/2019 0920 Last data filed at 12/12/2019 0919 Gross per 24 hour  Intake 720 ml  Output 825 ml  Net -105 ml   Filed Weights   12/11/19 1218 12/11/19 2111 12/12/19 0409  Weight: 101.6 kg 99 kg 99.2 kg   Body mass index is 33.25 kg/m.  Exam:  . General: 86 y.o. year-old male well developed well nourished in no acute distress.  Alert and oriented x3. . Cardiovascular: Regular rate and rhythm with no rubs or gallops.  No thyromegaly or JVD noted.   [name] Respiratory: Audible wheezes in the upper airway, small well rales. Good inspiratory effort.. . Abdomen: Soft nontender nondistended with normal bowel sounds x4 quadrants. . Musculoskeletal: 2+ lower extremity edema. 2/4 pulses in all 4 extremities. . Skin: No ulcerative lesions noted or rashes, . Psychiatry: Mood is appropriate for condition and setting    Data Reviewed: breath  EXAM: PORTABLE CHEST 1 VIEW  COMPARISON:  02/19/2018  FINDINGS: Stable positioning of a left-sided implanted cardiac device. Heart size remains enlarged. Calcific aortic knob. Hazy airspace opacities most pronounced within the peripheral aspect of the right mid and lower lung fields. Possible trace bilateral pleural  effusions. No pneumothorax. Severe degenerative changes of the bilateral shoulders.  IMPRESSION: 1. Hazy airspace opacity within the right mid and lower lung fields, suspicious for pneumonia. 2. Possible trace bilateral pleural effusions. 3. Stable cardiomegaly.     CBC: Recent Labs  Lab 12/07/19 1655 12/11/19 1245 12/12/19 0415  WBC 7.8 8.5 7.3  NEUTROABS 5.8  --  4.8  HGB 12.4* 12.6* 11.8*  HCT 38.0* 38.1* 35.7*  MCV 96.7 96.2 96.0  PLT 125.0* 136* 144*   Basic Metabolic Panel: Recent Labs  Lab 12/07/19 1655 12/11/19 1245 12/12/19 0415  NA 138 141 141  K 3.9 4.1 3.5  CL 101 103 105  CO2 25 25 25   GLUCOSE 152* 161* 151*  BUN 26* 34* 32*  CREATININE 1.03 1.23 1.08  CALCIUM 9.3 9.4 8.9   GFR: Estimated Creatinine Clearance: 54 mL/min (by C-G formula based on SCr of 1.08 mg/dL). Liver Function Tests: Recent Labs  Lab 12/11/19 1338  AST 28  ALT 31  ALKPHOS 83  BILITOT 1.3*  PROT 6.4*  ALBUMIN 3.4*   No results for input(s): LIPASE, AMYLASE in the last 168 hours. No results for input(s): AMMONIA in the last 168 hours. Coagulation Profile: No results for input(s): INR, PROTIME in the last 168  hours. Cardiac Enzymes: No results for input(s): CKTOTAL, CKMB, CKMBINDEX, TROPONINI in the last 168 hours. BNP (last 3 results) No results for input(s): PROBNP in the last 8760 hours. HbA1C: Recent Labs    12/12/19 0415  HGBA1C 7.0*   CBG: Recent Labs  Lab 12/11/19 2116 12/12/19 0613  GLUCAP 133* 135*   Lipid Profile: No results for input(s): CHOL, HDL, LDLCALC, TRIG, CHOLHDL, LDLDIRECT in the last 72 hours. Thyroid Function Tests: No results for input(s): TSH, T4TOTAL, FREET4, T3FREE, THYROIDAB in the last 72 hours. Anemia Panel: No results for input(s): VITAMINB12, FOLATE, FERRITIN, TIBC, IRON, RETICCTPCT in the last 72 hours. Urine analysis:    Component Value Date/Time   COLORURINE YELLOW 09/26/2015 1424   APPEARANCEUR CLEAR 09/26/2015 1424    LABSPEC 1.015 09/26/2015 1424   PHURINE 5.5 09/26/2015 1424   GLUCOSEU NEGATIVE 09/26/2015 1424   HGBUR NEGATIVE 09/26/2015 1424   BILIRUBINUR NEGATIVE 09/26/2015 1424   KETONESUR NEGATIVE 09/26/2015 1424   PROTEINUR NEGATIVE 02/08/2015 0900   UROBILINOGEN 0.2 09/26/2015 1424   NITRITE NEGATIVE 09/26/2015 1424   LEUKOCYTESUR NEGATIVE 09/26/2015 1424   Sepsis Labs: @LABRCNTIP (procalcitonin:4,lacticidven:4)  ) Recent Results (from the past 240 hour(s))  SARS CORONAVIRUS 2 (TAT 6-24 HRS) Nasopharyngeal Nasopharyngeal Swab     Status: None   Collection Time: 12/11/19  6:25 PM   Specimen: Nasopharyngeal Swab  Result Value Ref Range Status   SARS Coronavirus 2 NEGATIVE NEGATIVE Final    Comment: (NOTE) SARS-CoV-2 target nucleic acids are NOT DETECTED. The SARS-CoV-2 RNA is generally detectable in upper and lower respiratory specimens during the acute phase of infection. Negative results do not preclude SARS-CoV-2 infection, do not rule out co-infections with other pathogens, and should not be used as the sole basis for treatment or other patient management decisions. Negative results must be combined with clinical observations, patient history, and epidemiological information. The expected result is Negative. Fact Sheet for Patients: 02/08/20 Fact Sheet for Healthcare Providers: HairSlick.no This test is not yet approved or cleared by the [employer]ir[employer] FDA and  has been authorized for detection and/or diagnosis of SARS-CoV-2 by FDA under an Emergency Use Authorization (EUA). This EUA will remain  in effect (meaning this test can be used) for the duration of the COVID-19 declaration under Section 56 4(b)(1) of the Act, 21 U.S.C. section 360bbb-3(b)(1), unless the authorization is terminated or revoked sooner. Performed at [facility], [address], [place], [place]       Studies: DG Chest  Portable 1 View  Result Date: 12/11/2019 CLINICAL DATA:  Shortness of breath EXAM: PORTABLE CHEST 1 VIEW COMPARISON:  02/19/2018 FINDINGS: Stable positioning of a left-sided implanted cardiac device. Heart size remains enlarged. Calcific aortic knob. Hazy airspace opacities most pronounced within the peripheral aspect of the right mid and lower lung fields. Possible trace bilateral pleural effusions. No pneumothorax. Severe degenerative changes of the bilateral shoulders. IMPRESSION: 1. Hazy airspace opacity within the right mid and lower lung fields, suspicious for pneumonia. 2. Possible trace bilateral pleural effusions. 3. Stable cardiomegaly. Electronically Signed   By: 02/21/2018 D.O.   On: 12/11/2019 13:27    Scheduled Meds: . allopurinol  100 mg Oral Daily  . apixaban  5 mg Oral BID  . atorvastatin  20 mg Oral QPC supper  . cholecalciferol  1,000 Units Oral Daily  . docusate sodium  100 mg Oral QPC supper  . donepezil  5 mg Oral QHS  . furosemide  40 mg Intravenous  BID  . insulin aspart  0-5 Units Subcutaneous QHS  . insulin aspart  0-9 Units Subcutaneous TID WC  . lisinopril  2.5 mg Oral Daily  . magnesium oxide  400 mg Oral 2 times per day  . Melatonin  4.5 mg Oral QHS  . potassium chloride SA  20 mEq Oral Daily  . sodium chloride flush  3 mL Intravenous Q12H  . tamsulosin  0.4 mg Oral BID PC    Continuous Infusions: . sodium chloride       LOS: 1 day     Myrtie Neither, MD [facility]  To reach me or the doctor on call, go to: [email or web address] Password [email or web address]  12/12/2019, 9:20 AM

## 2019-12-12 NOTE — Plan of Care (Signed)

## 2019-12-12 NOTE — Progress Notes (Signed)
  Echocardiogram 2D Echocardiogram with definity has been performed.  Leta Jungling M 12/12/2019, 1:37 PM

## 2019-12-13 DIAGNOSIS — I4821 Permanent atrial fibrillation: Secondary | ICD-10-CM

## 2019-12-13 DIAGNOSIS — Z7901 Long term (current) use of anticoagulants: Secondary | ICD-10-CM

## 2019-12-13 DIAGNOSIS — I5043 Acute on chronic combined systolic (congestive) and diastolic (congestive) heart failure: Secondary | ICD-10-CM

## 2019-12-13 DIAGNOSIS — E785 Hyperlipidemia, unspecified: Secondary | ICD-10-CM

## 2019-12-13 DIAGNOSIS — J189 Pneumonia, unspecified organism: Secondary | ICD-10-CM

## 2019-12-13 LAB — BASIC METABOLIC PANEL
Anion gap: 9 (ref 5–15)
BUN: 32 mg/dL — ABNORMAL HIGH (ref 8–23)
CO2: 27 mmol/L (ref 22–32)
Calcium: 8.4 mg/dL — ABNORMAL LOW (ref 8.9–10.3)
Chloride: 102 mmol/L (ref 98–111)
Creatinine, Ser: 1.13 mg/dL (ref 0.61–1.24)
GFR calc Af Amer: >60 mL/min (ref >60)
GFR calc non Af Amer: 58 mL/min — ABNORMAL LOW (ref >60)
Glucose, Bld: 173 mg/dL — ABNORMAL HIGH (ref 70–99)
Potassium: 3.6 mmol/L (ref 3.5–5.1)
Sodium: 138 mmol/L (ref 135–145)

## 2019-12-13 LAB — GLUCOSE, CAPILLARY
Glucose-Capillary: 149 mg/dL — ABNORMAL HIGH (ref 70–99)
Glucose-Capillary: 104 mg/dL — ABNORMAL HIGH (ref 70–99)
Glucose-Capillary: 158 mg/dL — ABNORMAL HIGH (ref 70–99)
Glucose-Capillary: 118 mg/dL — ABNORMAL HIGH (ref 70–99)
Glucose-Capillary: 170 mg/dL — ABNORMAL HIGH (ref 70–99)

## 2019-12-13 LAB — MAGNESIUM: Magnesium: 1.6 mg/dL — ABNORMAL LOW (ref 1.7–2.4)

## 2019-12-13 MED ORDER — MAGNESIUM OXIDE 400 (241.3 MG) MG PO TABS: 400.0000 mg | ORAL_TABLET | Freq: Two times a day (BID) | ORAL | Status: DC

## 2019-12-13 MED ORDER — ALBUTEROL SULFATE (2.5 MG/3ML) 0.083% IN NEBU
2.5000 mg | INHALATION_SOLUTION | Freq: Four times a day (QID) | RESPIRATORY_TRACT | Status: DC | PRN
  Administered 2019-12-13 (×2): 2.5 mg via RESPIRATORY_TRACT
  Filled 2019-12-13 (×2): qty 3

## 2019-12-13 MED ORDER — FUROSEMIDE 10 MG/ML IJ SOLN
40.0000 mg | Freq: Four times a day (QID) | INTRAMUSCULAR | Status: DC
  Administered 2019-12-13 – 2019-12-15 (×7): 40 mg via INTRAVENOUS
  Filled 2019-12-13 (×7): qty 4

## 2019-12-13 NOTE — Plan of Care (Signed)

## 2019-12-13 NOTE — Progress Notes (Signed)
PROGRESS NOTE  [employer] ZOX:[id number] DOB: 01/07/1929 DOA: 12/11/2019 PCP: Wynn Banker, MD  HPI/Recap of past 24 hours: Per HPI by Dr. Mikeal Hawthorne: Greggory Stallion [place] is a 86 y.o. male with medical history significant of systolic dysfunction CHF, nonischemic ischemic cardiomyopathy, pacemaker placement, hypertension, diabetes, hyperlipidemia, frequent PVCs who came to the ER with progressive lower extremity edema and worsening exertional dyspnea.  Patient is on medications including ACE inhibitor and diuretics.  He has been taking his medications as prescribed.  He has noted gradual worsening of exertional dyspnea and lower extremity edema.  He came to the ER where he was seen and evaluated.  Patient appears to have significant exacerbation of his CHF.  He is being admitted with acute on chronic CHF.  His last echocardiogram in the system was from 2019 with a EF of 40 to 45%.  He denied any chest pain.  Denied any orthopnea but some PND.  Patient had a hemoglobin of 12.6 WBC of 7.  Patient was admitted for CHF he does have airspace opacity within the right middle lobe lung suspicious for pneumonia with trace bilateral pleural effusion.  December 12, 2019 Subjective: Patient seen and examined at bedside.  Nurse reported patient was short of breath and wheezing his vital signs was normal but she increased O2 to 2 L/min and was saturating at 95%.  Repeat chest x-ray was ordered also I ordered an additional 20 mg of Lasix.]  And return to evaluate patient patient said he felt better but then he was hot and was finding himself.  Repeat chest x-ray showed airspace disease possible pneumonia patient was therefore started on Rocephin.  Patient has atrial fibrillation proximal chronic on apixaban   December 13, 2019 Subjective: Patient seen and examined at bedside nurses reporting patient is still wheezing. Patient does appear to be slightly out of breath and seem to be using accessory  muscle.  His wheezing is mostly upper airway in the around the mouth area.  He stated that he felt fine.  His output is very good.  He was started on Rocephin for pneumonia yesterday.  I will add albuterol nebulizer to see if that will help him with his seemingly short of breath and wheezing.  Assessment/Plan: Principal Problem:   Acute on chronic combined systolic and diastolic CHF (congestive heart failure) (HCC) Active Problems:   DM (diabetes mellitus) type II controlled with renal manifestation (HCC)   Nephropathy, diabetic (HCC)   Dyslipidemia   Essential hypertension   MYOCARDIAL INFARCTION, HX OF   Atrial fibrillation (HCC)   Edema   Long term (current) use of anticoagulants   Systolic CHF, chronic (HCC)   Hyperlipidemia   GERD (gastroesophageal reflux disease)   Bradycardia #1 acute on chronic systolic dysfunction.  Patient was given extra Lasix 20 mg this morning due to complaint of shortness of breath with wheezing per nurse.  Chest x-ray showed pleural effusion and airspace disease.  Patient has urine good urine output.  I will consult with cardiology I will also add albuterol nebulizer  2.  Airspace disease/pneumonia patient will be started on Rocephin patient still wheezing I will add albuterol nebulizer  3.  Chronic atrial fibrillation with rapid ventricular response rate controlled continue apixaban  4.  Hyperlipidemia continue statin  5.  GERD continue PPI  6.  Diabetes mellitus continue sliding scale insulin  7.  Essential hypertension blood pressure is well controlled  Code Status: Full  Severity of Illness: The appropriate patient status  for this patient is INPATIENT. Inpatient status is judged to be reasonable and necessary in order to provide the required intensity of service to ensure the patient's safety. The patient's presenting symptoms, physical exam findings, and initial radiographic and laboratory data in the context of their chronic comorbidities is  felt to place them at high risk for further clinical deterioration. Furthermore, it is not anticipated that the patient will be medically stable for discharge from the hospital within 2 midnights of admission. The following factors support the patient status of inpatient.   " The patient's presenting symptoms include shortness of breath. " The worrisome physical exam findings include heart failure with possible pneumonia. " The initial radiographic and laboratory data are worrisome because of failure with pneumonia. " The chronic co-morbidities include edema.   * I certify that at the point of admission it is my clinical judgment that the patient will require inpatient hospital care spanning beyond 2 midnights from the point of admission due to high intensity of service, high risk for further deterioration and high frequency of surveillance required.*    Family Communication: None at bedside  Disposition Plan: Home when stable   Consultants:  Cardiology,[facility]  Procedures:  2D echo  Antimicrobials:  Rocephin  DVT prophylaxis: Apixaban   Objective: Vitals:   12/12/19 0807 12/12/19 1225 12/12/19 2016 12/13/19 0437  BP: 120/81 110/72 114/77 121/69  Pulse: 64 (!) 52 (!) 102 (!) 54  Resp:  (!) 22 20 20   Temp: 97.8 F (36.6 C) 98.1 F (36.7 C) 98.2 F (36.8 C) (!) 97.5 F (36.4 C)  TempSrc: Oral Oral  Oral  SpO2: 100% 100% 98% 96%  Weight:    99.7 kg  Height:        Intake/Output Summary (Last 24 hours) at 12/13/2019 0857 Last data filed at 12/13/2019 0831 Gross per 24 hour  Intake 820 ml  Output 1155 ml  Net -335 ml   Filed Weights   12/11/19 2111 12/12/19 0409 12/13/19 0437  Weight: 99 kg 99.2 kg 99.7 kg   Body mass index is 33.42 kg/m.  Exam:  . General: 86 y.o. year-old male well developed well nourished in no acute distress.  Alert and oriented x3. . Cardiovascular: Regular rate and rhythm with no rubs or gallops.  No thyromegaly or JVD noted.    98 Respiratory: Audible wheezes in the upper airway, mild rales. Good inspiratory effort.. . Abdomen: Soft nontender nondistended with normal bowel sounds x4 quadrants. . Musculoskeletal: 2+ lower extremity edema. 2/4 pulses in all 4 extremities. . Skin: No ulcerative lesions noted or rashes, . Psychiatry: Mood is appropriate for condition and setting    Data Reviewed: breath  EXAM: PORTABLE CHEST 1 VIEW  COMPARISON:  02/19/2018  FINDINGS: Stable positioning of a left-sided implanted cardiac device. Heart size remains enlarged. Calcific aortic knob. Hazy airspace opacities most pronounced within the peripheral aspect of the right mid and lower lung fields. Possible trace bilateral pleural effusions. No pneumothorax. Severe degenerative changes of the bilateral shoulders.  IMPRESSION: 1. Hazy airspace opacity within the right mid and lower lung fields, suspicious for pneumonia. 2. Possible trace bilateral pleural effusions. 3. Stable cardiomegaly.     CBC: Recent Labs  Lab 12/07/19 1655 12/11/19 1245 12/12/19 0415  WBC 7.8 8.5 7.3  NEUTROABS 5.8  --  4.8  HGB 12.4* 12.6* 11.8*  HCT 38.0* 38.1* 35.7*  MCV 96.7 96.2 96.0  PLT 125.0* 136* 122*   Basic Metabolic Panel: Recent Labs  Lab 12/07/19  1655 12/11/19 1245 12/12/19 0415  NA 138 141 141  K 3.9 4.1 3.5  CL 101 103 105  CO2 25 25 25   GLUCOSE 152* 161* 151*  BUN 26* 34* 32*  CREATININE 1.03 1.23 1.08  CALCIUM 9.3 9.4 8.9   GFR: Estimated Creatinine Clearance: 54.1 mL/min (by C-G formula based on SCr of 1.08 mg/dL). Liver Function Tests: Recent Labs  Lab 12/11/19 1338  AST 28  ALT 31  ALKPHOS 83  BILITOT 1.3*  PROT 6.4*  ALBUMIN 3.4*   No results for input(s): LIPASE, AMYLASE in the last 168 hours. No results for input(s): AMMONIA in the last 168 hours. Coagulation Profile: No results for input(s): INR, PROTIME in the last 168 hours. Cardiac Enzymes: No results for input(s): CKTOTAL,  CKMB, CKMBINDEX, TROPONINI in the last 168 hours. BNP (last 3 results) No results for input(s): PROBNP in the last 8760 hours. HbA1C: Recent Labs    12/12/19 0415  HGBA1C 7.0*   CBG: Recent Labs  Lab 12/12/19 0613 12/12/19 1221 12/12/19 1658 12/12/19 2124 12/13/19 0647  GLUCAP 135* 154* 126* 149* 104*   Lipid Profile: No results for input(s): CHOL, HDL, LDLCALC, TRIG, CHOLHDL, LDLDIRECT in the last 72 hours. Thyroid Function Tests: No results for input(s): TSH, T4TOTAL, FREET4, T3FREE, THYROIDAB in the last 72 hours. Anemia Panel: No results for input(s): VITAMINB12, FOLATE, FERRITIN, TIBC, IRON, RETICCTPCT in the last 72 hours. Urine analysis:    Component Value Date/Time   COLORURINE YELLOW 09/26/2015 1424   APPEARANCEUR CLEAR 09/26/2015 1424   LABSPEC 1.015 09/26/2015 1424   PHURINE 5.5 09/26/2015 1424   GLUCOSEU NEGATIVE 09/26/2015 1424   HGBUR NEGATIVE 09/26/2015 1424   BILIRUBINUR NEGATIVE 09/26/2015 1424   KETONESUR NEGATIVE 09/26/2015 1424   PROTEINUR NEGATIVE 02/08/2015 0900   UROBILINOGEN 0.2 09/26/2015 1424   NITRITE NEGATIVE 09/26/2015 1424   LEUKOCYTESUR NEGATIVE 09/26/2015 1424   Sepsis Labs: @LABRCNTIP (procalcitonin:4,lacticidven:4)  ) Recent Results (from the past 240 hour(s))  SARS CORONAVIRUS 2 (TAT 6-24 HRS) Nasopharyngeal Nasopharyngeal Swab     Status: None   Collection Time: 12/11/19  6:25 PM   Specimen: Nasopharyngeal Swab  Result Value Ref Range Status   SARS Coronavirus 2 NEGATIVE NEGATIVE Final    Comment: (NOTE) SARS-CoV-2 target nucleic acids are NOT DETECTED. The SARS-CoV-2 RNA is generally detectable in upper and lower respiratory specimens during the acute phase of infection. Negative results do not preclude SARS-CoV-2 infection, do not rule out co-infections with other pathogens, and should not be used as the sole basis for treatment or other patient management decisions. Negative results must be combined with clinical  observations, patient history, and epidemiological information. The expected result is Negative. Fact Sheet for Patients: SugarRoll.be Fact Sheet for Healthcare Providers: https://www.woods-mathews.com/ This test is not yet approved or cleared by the [place] FDA and  has been authorized for detection and/or diagnosis of SARS-CoV-2 by FDA under an Emergency Use Authorization (EUA). This EUA will remain  in effect (meaning this test can be used) for the duration of the COVID-19 declaration under Section 56 4(b)(1) of the Act, 21 U.S.C. section 360bbb-3(b)(1), unless the authorization is terminated or revoked sooner. Performed at [facility], [place] [address], [place], [place] [address]       Studies: DG CHEST PORT 1 VIEW  Result Date: 12/12/2019 CLINICAL DATA:  Shortness of breath EXAM: PORTABLE CHEST 1 VIEW COMPARISON:  12/11/2019 FINDINGS: Cardiac shadow remains enlarged. Pacing device is again noted and stable. Patchy airspace opacities  are again seen and stable. The overall inspiratory effort is poor. No bony abnormality is noted. IMPRESSION: Stable patchy infiltrates bilaterally. Electronically Signed   By: Alcide Clever M.D.   On: 12/12/2019 11:46   ECHOCARDIOGRAM COMPLETE  Result Date: 12/12/2019   ECHOCARDIOGRAM REPORT   Patient Name:   LAMARIUS CURRO Date of Exam: 12/12/2019 Medical Rec #:  [id number]           Height:       68.0 in Accession #:    [id number]          Weight:       218.7 lb Date of Birth:  06-16-1929            BSA:          2.12 m Patient Age:    89 years            BP:           110/72 mmHg Patient Gender: M                   HR:           52 bpm. Exam Location:  Inpatient Procedure: 2D Echo and Intracardiac Opacification Agent Indications:    CHF-Acute Systolic 428.21 / I50.21  History:        Patient has prior history of Echocardiogram examinations, most                 recent 02/19/2018. Pacemaker,  Arrythmias:Atrial Fibrillation,                 Signs/Symptoms:Dyspnea; Risk Factors:Hypertension, Dyslipidemia                 and Diabetes. Cardiomyoapthy. Lower extremity edema.  Sonographer:    Leta Jungling RDCS Referring Phys: [id number] MOHAMMAD L GARBA IMPRESSIONS  1. Left ventricular ejection fraction, by visual estimation, is 30 to 35%. The left ventricle has moderate to severely decreased function. There is no left ventricular hypertrophy.  2. Definity contrast agent was given IV to delineate the left ventricular endocardial borders.  3. Left ventricular diastolic function could not be evaluated.  4. The left ventricle demonstrates global hypokinesis.  5. Global right ventricle has normal systolic function.The right ventricular size is normal. No increase in right ventricular wall thickness.  6. Left atrial size was severely dilated.  7. Right atrial size was severely dilated.  8. The mitral valve is normal in structure. Mild to moderate mitral valve regurgitation. No evidence of mitral stenosis.  9. The tricuspid valve is normal in structure. 10. The aortic valve is normal in structure. Aortic valve regurgitation is not visualized. Mild to moderate aortic valve sclerosis/calcification without any evidence of aortic stenosis. 11. The pulmonic valve was normal in structure. Pulmonic valve regurgitation is trivial. 12. Aortic dilatation noted. 13. There is mild dilatation of the ascending aorta measuring 42 mm. 14. Compared with the echo 02/2018, the ascending aorta has increased from 3.8 cm to 4.2 cm. 15. Moderately elevated pulmonary artery systolic pressure. 16. A pacer wire is visualized. 17. The inferior vena cava is normal in size with greater than 50% respiratory variability, suggesting right atrial pressure of 3 mmHg. FINDINGS  Left Ventricle: Left ventricular ejection fraction, by visual estimation, is 30 to 35%. The left ventricle has moderate to severely decreased function. Definity contrast agent was  given IV to delineate the left ventricular endocardial borders. The left ventricle demonstrates global hypokinesis. There is no left ventricular  hypertrophy. The left ventricular diastology could not be evaluated due to atrial fibrillation. Left ventricular diastolic function could not be evaluated. Normal left atrial pressure. Right Ventricle: The right ventricular size is normal. No increase in right ventricular wall thickness. Global RV systolic function is has normal systolic function. The tricuspid regurgitant velocity is 2.51 m/s, and with an assumed right atrial pressure  of 15 mmHg, the estimated right ventricular systolic pressure is moderately elevated at 40.2 mmHg. Left Atrium: Left atrial size was severely dilated. Right Atrium: Right atrial size was severely dilated Pericardium: There is no evidence of pericardial effusion. Mitral Valve: The mitral valve is normal in structure. Mild to moderate mitral valve regurgitation. No evidence of mitral valve stenosis by observation. Tricuspid Valve: The tricuspid valve is normal in structure. Tricuspid valve regurgitation mild-moderate. Aortic Valve: The aortic valve is normal in structure. Aortic valve regurgitation is not visualized. Mild to moderate aortic valve sclerosis/calcification is present, without any evidence of aortic stenosis. There is moderate calcification of the aortic valve. Aortic valve mean gradient measures 3.5 mmHg. Aortic valve peak gradient measures 6.2 mmHg. Aortic valve area, by VTI measures 3.69 cm. Pulmonic Valve: The pulmonic valve was normal in structure. Pulmonic valve regurgitation is trivial. Pulmonic regurgitation is trivial. Aorta: The aortic root, ascending aorta and aortic arch are all structurally normal, with no evidence of dilitation or obstruction and aortic dilatation noted. There is mild dilatation of the ascending aorta measuring 42 mm. Compared with the echo 02/2018, the ascending aorta has increased from 3.8 cm to  4.2 cm. Venous: The inferior vena cava is normal in size with greater than 50% respiratory variability, suggesting right atrial pressure of 3 mmHg. IAS/Shunts: No atrial level shunt detected by color flow Doppler. There is no evidence of a patent foramen ovale. No ventricular septal defect is seen or detected. There is no evidence of an atrial septal defect. Additional Comments: A pacer wire is visualized.  LEFT VENTRICLE PLAX 2D LVIDd:         6.97 cm LVIDs:         6.06 cm LV PW:         1.18 cm LV IVS:        0.79 cm LVOT diam:     2.60 cm LV SV:         69 ml LV SV Index:   31.06 LVOT Area:     5.31 cm  LV Volumes (MOD) LV area d, A2C:    48.70 cm LV area d, A4C:    46.18 cm LV area s, A2C:    37.30 cm LV area s, A4C:    36.40 cm LV major d, A2C:   8.37 cm LV major d, A4C:   8.56 cm LV major s, A2C:   7.11 cm LV major s, A4C:   8.83 cm LV vol d, MOD A2C: 227.0 ml LV vol d, MOD A4C: 208.8 ml LV vol s, MOD A2C: 158.0 ml LV vol s, MOD A4C: 124.0 ml LV SV MOD A2C:     69.0 ml LV SV MOD A4C:     208.8 ml LV SV MOD BP:      62.7 ml RIGHT VENTRICLE TAPSE (M-mode): 1.7 cm LEFT ATRIUM              Index       RIGHT ATRIUM           Index LA diam:        5.50 cm  2.59 cm/m  RA Area:     31.50 cm LA Vol (A2C):   166.0 ml 78.20 ml/m RA Volume:   107.00 ml 50.40 ml/m LA Vol (A4C):   173.0 ml 81.49 ml/m LA Biplane Vol: 170.0 ml 80.08 ml/m  AORTIC VALVE AV Area (Vmax):    3.96 cm AV Area (Vmean):   3.75 cm AV Area (VTI):     3.69 cm AV Vmax:           124.00 cm/s AV Vmean:          85.300 cm/s AV VTI:            0.171 m AV Peak Grad:      6.2 mmHg AV Mean Grad:      3.5 mmHg LVOT Vmax:         92.40 cm/s LVOT Vmean:        60.200 cm/s LVOT VTI:          0.119 m LVOT/AV VTI ratio: 0.70  AORTA Ao Root diam: 3.40 cm Ao Asc diam:  4.20 cm MR Peak grad: 70.6 mmHg   TRICUSPID VALVE MR Mean grad: 47.0 mmHg   TR Peak grad:   25.2 mmHg MR Vmax:      420.00 cm/s TR Vmax:        251.00 cm/s MR Vmean:     324.0 cm/s                            SHUNTS                           Systemic VTI:  0.12 m                           Systemic Diam: 2.60 cm  Chilton Si MD Electronically signed by Chilton Si MD Signature Date/Time: 12/12/2019/2:21:27 PM    Final     Scheduled Meds: . allopurinol  100 mg Oral Daily  . apixaban  5 mg Oral BID  . atorvastatin  20 mg Oral QPC supper  . cholecalciferol  1,000 Units Oral Daily  . docusate sodium  100 mg Oral QPC supper  . donepezil  5 mg Oral QHS  . furosemide  40 mg Intravenous BID  . insulin aspart  0-5 Units Subcutaneous QHS  . insulin aspart  0-9 Units Subcutaneous TID WC  . lisinopril  2.5 mg Oral Daily  . magnesium oxide  400 mg Oral 2 times per day  . Melatonin  4.5 mg Oral QHS  . potassium chloride SA  20 mEq Oral Daily  . sodium chloride flush  3 mL Intravenous Q12H  . tamsulosin  0.4 mg Oral BID PC    Continuous Infusions: . sodium chloride    . cefTRIAXone (ROCEPHIN)  IV       LOS: 2 days     Myrtie Neither, MD [facility]  To reach me or the doctor on call, go to: [email or web address] Password [facility]  12/13/2019, 8:57 AM

## 2019-12-13 NOTE — Consult Note (Addendum)
Cardiology Consultation:   Patient ID: [name] MRN: [id number]; DOB: August 03, 1929  Admit date: 12/11/2019 Date of Consult: 12/13/2019  Primary Care Provider: Caren Macadam, MD Primary Cardiologist: Kirk Ruths, MD  Primary Electrophysiologist:  Cristopher Peru, MD    Patient Profile:   Steven R[place] Kim is a 86 y.o. male with a history of normal coronaries on cardiac catheterization in 5284, chronic systolic CHF/non-ischemic cardiomyopathy with EF of 40-45% on Echo in 02/2018, s/p BiV ICD placement in 10/2012 which was downgraded to BiV PPM in 07/2016, permanent atrial fibrillation on Eliquis, hypertension, hyperlipidemia, type 2 diabetes mellitus, BPH, who is being seen today for the evaluation of acute on chronic CHF at the request of Dr. Kyung Bacca.  History of Present Illness:   Mr. Froning is a 86 year old male with the above history who is followed by Dr. [name] and Dr. Lovena Le. Most recent Echo from 02/2018 showed LVEF of 40-45%, moderate LVH, moderate LV enlargement, severe biatrial enlargement, and mild to moderate MR. Patient was last seen by Dr. [name] in 07/2019 at which time he was doing well from a cardiac standpoint. He noted chronic pedal edema which was stable on Lasix '80mg'$  daily but denied any chest pain, dyspnea, palpitations, or syncope. No medication changes were made and he was advised to follow-up in 6 months.  Patient presented to the [facility] ED on 12/11/2019 for worsening shortness of breath. In the ED, patient mildly tachycardic and tachypneic. EKG showed v-pacing with underlying rapid atrial fibrillation. High-sensitivity troponin minimally elevated and flat at 41 >> 42. BNP elevated at 1,035. Chest x-ray showed hazy opacity within the right mid and lower lung fields suspicious for pneumonia as well as possible trace bilateral pleural effusions. WBC 8.5, Hgb 12.6, Plts 136. Na 141, K 4.1, Glucose 161, BUN 34, Cr 1.23. Albumin 3.4, AST 28, ALT 31,  Alk Phos 83, Total Bili 1.3, Direct Bili 0.4. COVID-19 testing negative. Patient was admitted for acute on chronic CHF and started on IV Lasix '40mg'$  twice daily. Repeat Echo yesterday showed LVEF of 30-35% (down from 40-45% in 02/2018) with global hypokinesis, severe biatrial enlargement, mild to moderate MR, and mild dilatation of the ascending aorta measuring 42 mm (increaed from 65m in 02/2018).   Cardiology consulted today for further evaluation. At the time of this evaluation, patient resting comfortably on 3 L of supplemental O2 via nasal cannula. He is hard of hearing and somewhat of a poor historian so it was difficult to get a detailed history. Patient reports worsening shortness of breath with exertion for a "while." He could not tell me exactly how long but thinks it has been a few weeks. He states he cannot walk very far now without get short of breath and feeling very weak. He also reports worsening lower extremity edema recently. He describes stable orthopnea but no recent PND. He notes occasional mild lightheadedness/ dizziness especially if he stands too quickly but denies any palpitations, falls, or syncope. No chest pain. He reports some nausea mainly after meals but no abdominal pain of vomiting. No fevers or coughs. No abnormal bleeding on the eliquis. He states he has been compliant with his medications including Lasix and Eliquis. He lives alone but his daughter helps with his medications. He is prescribed Lasix '80mg'$  daily but has been advised that he can take an additional '40mg'$  in the afternoon if needed. Patient states he has not been taking any additional doses of Lasix. Weight 224 lbs on admission up  from 215 lbs at last office visit in 07/2019. Sounds like patient has not been following a low sodium diet and has been eating bacon and frozen foods.   Heart Pathway Score:     Past Medical History:  Diagnosis Date  . Anemia   . Atrial fibrillation ([place])   . BPH (benign prostatic  hyperplasia)   . Bradycardia   . Cardiomyopathy   . CHF (congestive heart failure) ([place])   . Diabetes mellitus type II   . GERD (gastroesophageal reflux disease)   . History of colonoscopy   . HTN (hypertension)   . Hyperlipidemia   . ICD (implantable cardiac defibrillator) in place 10/14/2012   biventricular  . OA (osteoarthritis)   . Obesity   . Peptic ulcer     Past Surgical History:  Procedure Laterality Date  . EP IMPLANTABLE DEVICE Left   . EP IMPLANTABLE DEVICE N/A 07/27/2016   Procedure: BIV Pacemaker downgrade;  Surgeon: Evans Lance, MD;  Location: [facility];  Service: Cardiovascular;  Laterality: N/A;  . ESOPHAGOGASTRODUODENOSCOPY  06/24/2002  . L-spine  1965  . Lumbar L4-5 & S1  02/2000  . TOTAL KNEE ARTHROPLASTY  1997   right  . TOTAL KNEE ARTHROPLASTY Left 02/15/2015   Procedure: LEFT TOTAL KNEE ARTHROPLASTY;  Surgeon: Paralee Cancel, MD;  Location: [facility];  Service: Orthopedics;  Laterality: Left;     Home Medications:  Prior to Admission medications   Medication Sig Start Date End Date Taking? Authorizing Provider  acetaminophen (TYLENOL) 650 MG CR tablet Take 1,300 mg by mouth at bedtime as needed for pain.    Yes [provider]  allopurinol (ZYLOPRIM) 100 MG tablet TAKE 1 TABLET EVERY DAY Patient taking differently: Take 100 mg by mouth daily after breakfast.  10/27/19  Yes Koberlein, Junell C, MD  apixaban (ELIQUIS) 5 MG TABS tablet TAKE 1 TABLET(5 MG) BY MOUTH TWICE DAILY Patient taking differently: Take 5 mg by mouth 2 (two) times daily after a meal.  09/02/19  Yes Koberlein, Junell C, MD  atorvastatin (LIPITOR) 20 MG tablet TAKE 1 TABLET EVERY DAY Patient taking differently: Take 20 mg by mouth daily after supper.  08/25/19  Yes Lelon Perla, MD  cholecalciferol (VITAMIN D) 1000 units tablet Take 1,000 Units by mouth daily after breakfast.    Yes [provider]  Docusate Calcium (STOOL SOFTENER PO) Take 2 capsules by mouth  daily after supper.   Yes [provider]  donepezil (ARICEPT) 5 MG tablet TAKE 1 TABLET  BY MOUTH AT BEDTIME. Patient taking differently: Take 5 mg by mouth at bedtime.  12/07/19  Yes Koberlein, Junell C, MD  furosemide (LASIX) 40 MG tablet TAKE 2 TABLETS EVERY DAY Patient taking differently: Take 40-80 mg by mouth See admin instructions. Take two tablets (80 mg) by mouth daily after breakfast, may take an additional tablet (40 mg) at 2pm as needed for overnight weight gain of 2-3 lbs 10/26/19  Yes Crenshaw, Denice Bors, MD  lisinopril (ZESTRIL) 2.5 MG tablet TAKE 1 TABLET EVERY DAY (KEEP AUGUST APPOINTMENT) Patient taking differently: Take 2.5 mg by mouth daily after breakfast.  08/25/19  Yes Crenshaw, Denice Bors, MD  magnesium oxide (MAG-OX) 400 (241.3 Mg) MG tablet TAKE 1 TABLET (400 MG TOTAL) BY MOUTH 2 (TWO) TIMES DAILY. Patient taking differently: Take 400 mg by mouth See admin instructions. Take one tablet (400 mg) by mouth twice daily - after breakfast and at 2pm 08/26/18  Yes Crenshaw, Denice Bors,  MD  MELATONIN ER PO Take 1 tablet by mouth at bedtime.   Yes [provider]  potassium chloride SA (K-DUR) 20 MEQ tablet TAKE 1 TABLET EVERY DAY Patient taking differently: Take 20 mEq by mouth daily after breakfast.  07/23/19  Yes Crenshaw, Denice Bors, MD  tamsulosin (FLOMAX) 0.4 MG CAPS capsule TAKE 1 CAPSULE TWICE DAILY Patient taking differently: Take 0.4 mg by mouth 2 (two) times daily after a meal.  11/20/19  Yes Koberlein, Steele Berg, MD    Inpatient Medications: Scheduled Meds: . allopurinol  100 mg Oral Daily  . apixaban  5 mg Oral BID  . atorvastatin  20 mg Oral QPC supper  . cholecalciferol  1,000 Units Oral Daily  . docusate sodium  100 mg Oral QPC supper  . donepezil  5 mg Oral QHS  . furosemide  40 mg Intravenous BID  . insulin aspart  0-5 Units Subcutaneous QHS  . insulin aspart  0-9 Units Subcutaneous TID WC  . lisinopril  2.5 mg Oral Daily  . magnesium oxide  400 mg  Oral 2 times per day  . Melatonin  4.5 mg Oral QHS  . potassium chloride SA  20 mEq Oral Daily  . sodium chloride flush  3 mL Intravenous Q12H  . tamsulosin  0.4 mg Oral BID PC   Continuous Infusions: . sodium chloride    . cefTRIAXone (ROCEPHIN)  IV     PRN Meds: sodium chloride, acetaminophen, ondansetron (ZOFRAN) IV, sodium chloride flush  Allergies:   No Known Allergies  Social History:   Social History   Socioeconomic History  . Marital status: Widowed    Spouse name: Not on file  . Number of children: Not on file  . Years of education: Not on file  . Highest education level: Not on file  Occupational History  . Occupation: Retired  Tobacco Use  . Smoking status: Never Smoker  . Smokeless tobacco: Never Used  Substance and Sexual Activity  . Alcohol use: No  . Drug use: No  . Sexual activity: Not on file  Other Topics Concern  . Not on file  Social History Narrative  . Not on file   Social Determinants of Health   Financial Resource Strain:   . Difficulty of Paying Living Expenses: Not on file  Food Insecurity:   . Worried About Charity fundraiser in the Last Year: Not on file  . Ran Out of Food in the Last Year: Not on file  Transportation Needs:   . Lack of Transportation (Medical): Not on file  . Lack of Transportation (Non-Medical): Not on file  Physical Activity:   . Days of Exercise per Week: Not on file  . Minutes of Exercise per Session: Not on file  Stress:   . Feeling of Stress : Not on file  Social Connections:   . Frequency of Communication with Friends and Family: Not on file  . Frequency of Social Gatherings with Friends and Family: Not on file  . Attends Religious Services: Not on file  . Active Member of Clubs or Organizations: Not on file  . Attends Archivist Meetings: Not on file  . Marital Status: Not on file  Intimate Partner Violence:   . Fear of Current or Ex-Partner: Not on file  . Emotionally Abused: Not on file    . Physically Abused: Not on file  . Sexually Abused: Not on file    Family History:   Family History  Problem  Relation Age of Onset  . Heart disease Father   . Heart disease Mother   . Diabetes Mother   . Hypertension Brother   . Hypertension Brother   . Cancer Neg Hx      ROS:  Please see the history of present illness.  Review of Systems  Constitutional: Negative for fever.  Respiratory: Positive for shortness of breath and wheezing. Negative for cough.   Cardiovascular: Positive for orthopnea and leg swelling. Negative for chest pain, palpitations and PND.  Gastrointestinal: Positive for nausea. Negative for abdominal pain, blood in stool, melena and vomiting.  Genitourinary: Negative for hematuria.  Musculoskeletal: Negative for falls.  Neurological: Positive for weakness. Negative for loss of consciousness. Dizziness: lightheadedness.  Endo/Heme/Allergies: Does not bruise/bleed easily.  Psychiatric/Behavioral: Negative for substance abuse.   All other ROS reviewed and negative.     Physical Exam/Data:   Vitals:   12/12/19 0807 12/12/19 1225 12/12/19 2016 12/13/19 0437  BP: 120/81 110/72 114/77 121/69  Pulse: 64 (!) 52 (!) 102 (!) 54  Resp:  (!) _0 Temp: 97.8 F (36.6 C) 98.1 F (36.7 C) 98.2 F (36.8 C) (!) 97.5 F (36.4 C)  TempSrc: Oral Oral  Oral  SpO2: 100% 100% 98% 96%  Weight:    99.7 kg  Height:        Intake/Output Summary (Last 24 hours) at 12/13/2019 0929 Last data filed at 12/13/2019 0831 Gross per 24 hour  Intake 820 ml  Output 1030 ml  Net -210 ml   Last 3 Weights 12/13/2019 12/12/2019 12/11/2019  Weight (lbs) 219 lb 12.8 oz 218 lb 11.1 oz 218 lb 4.1 oz  Weight (kg) 99.7 kg 99.2 kg 99 kg     Body mass index is 33.42 kg/m.  General: 86 y.o. male resting comfortably in no acute distress. HEENT: Normocephalic and atraumatic. Sclera clear. EOMs intact. Neck: Supple. No carotid bruits. No JVD. Heart: Irregularly irregular rhythm and  borderline tachycardic Distinct S1 and S2. II-III/VI systolic murmur noted at apex. Radial pulses 2+ and equal bilaterally. Lungs: Audible wheezes noted throughout conversation. Decreased and tight breath sounds bilaterally with expiratory wheezes. Very faint crackles noted in bases.  Abdomen: Soft, non-distended, and non-tender to palpation. Bowel sounds present. Extremities: 1+ pitting edema of bilateral lower extremities.   Skin: Warm and dry. Hyperpigmentation of bilateral lower extremities consistent with chronic venous insufficiency. Neuro: No focal deficits. Psych: Normal affect.   EKG:  The EKG was personally reviewed and demonstrates:  Ventricular paced with underlying atrial fibrillation. Rate in the 110's to low 120's.  Telemetry:  Telemetry was personally reviewed and demonstrates: Ventricular pacing with underlying atrial fibrillation and frequent PVCs. Rates in the 80's to high 110's/low 120's.  Relevant CV Studies:  Echocardiogram 12/12/2019: Impressions:  1. Left ventricular ejection fraction, by visual estimation, is 30 to 35%. The left ventricle has moderate to severely decreased function. There is no left ventricular hypertrophy.  2. Definity contrast agent was given IV to delineate the left ventricular endocardial borders.  3. Left ventricular diastolic function could not be evaluated.  4. The left ventricle demonstrates global hypokinesis.  5. Global right ventricle has normal systolic function.The right ventricular size is normal. No increase in right ventricular wall thickness.  6. Left atrial size was severely dilated.  7. Right atrial size was severely dilated.  8. The mitral valve is normal in structure. Mild to moderate mitral valve regurgitation. No evidence of mitral stenosis.  9. The tricuspid valve is  normal in structure. 10. The aortic valve is normal in structure. Aortic valve regurgitation is not visualized. Mild to moderate aortic valve sclerosis/calcification  without any evidence of aortic stenosis. 11. The pulmonic valve was normal in structure. Pulmonic valve regurgitation is trivial. 12. Aortic dilatation noted. 13. There is mild dilatation of the ascending aorta measuring 42 mm. 14. Compared with the echo 02/2018, the ascending aorta has increased from 3.8 cm to 4.2 cm. 15. Moderately elevated pulmonary artery systolic pressure. 16. A pacer wire is visualized. 17. The inferior vena cava is normal in size with greater than 50% respiratory variability, suggesting right atrial pressure of 3 mmHg.  Laboratory Data:  High Sensitivity Troponin:   Recent Labs  Lab 12/11/19 1338 12/11/19 1638  TROPONINIHS 41* 42*     Chemistry Recent Labs  Lab 12/07/19 1655 12/11/19 1245 12/12/19 0415  NA 138 141 141  K 3.9 4.1 3.5  CL 101 103 105  CO2 _0 GLUCOSE 152* 161* 151*  BUN 26* 34* 32*  CREATININE 1.03 1.23 1.08  CALCIUM 9.3 9.4 8.9  GFRNONAA  --  52* >60  GFRAA  --  >60 >60  ANIONGAP  --  13 11    Recent Labs  Lab 12/11/19 1338  PROT 6.4*  ALBUMIN 3.4*  AST 28  ALT 31  ALKPHOS 83  BILITOT 1.3*   Hematology Recent Labs  Lab 12/07/19 1655 12/11/19 1245 12/12/19 0415  WBC 7.8 8.5 7.3  RBC 3.93* 3.96* 3.72*  HGB 12.4* 12.6* 11.8*  HCT 38.0* 38.1* 35.7*  MCV 96.7 96.2 96.0  MCH  --  31.8 31.7  MCHC 32.7 33.1 33.1  RDW 14.7 14.5 14.5  PLT 125.0* 136* 122*   BNP Recent Labs  Lab 12/11/19 1638  BNP 1,035.4*    DDimer No results for input(s): DDIMER in the last 168 hours.  Radiology/Studies:  DG CHEST PORT 1 VIEW  Result Date: 12/12/2019 CLINICAL DATA:  Shortness of breath EXAM: PORTABLE CHEST 1 VIEW COMPARISON:  12/11/2019 FINDINGS: Cardiac shadow remains enlarged. Pacing device is again noted and stable. Patchy airspace opacities are again seen and stable. The overall inspiratory effort is poor. No bony abnormality is noted. IMPRESSION: Stable patchy infiltrates bilaterally. Electronically Signed   By: Inez Catalina M.D.   On: 12/12/2019 11:46   DG Chest Portable 1 View  Result Date: 12/11/2019 CLINICAL DATA:  Shortness of breath EXAM: PORTABLE CHEST 1 VIEW COMPARISON:  02/19/2018 FINDINGS: Stable positioning of a left-sided implanted cardiac device. Heart size remains enlarged. Calcific aortic knob. Hazy airspace opacities most pronounced within the peripheral aspect of the right mid and lower lung fields. Possible trace bilateral pleural effusions. No pneumothorax. Severe degenerative changes of the bilateral shoulders. IMPRESSION: 1. Hazy airspace opacity within the right mid and lower lung fields, suspicious for pneumonia. 2. Possible trace bilateral pleural effusions. 3. Stable cardiomegaly. Electronically Signed   By: Davina Poke D.O.   On: 12/11/2019 13:27   ECHOCARDIOGRAM COMPLETE  Result Date: 12/12/2019   ECHOCARDIOGRAM REPORT   Patient Name:   JABIR DAHLEM Date of Exam: 12/12/2019 Medical Rec #:  [id number]           Height:       68.0 in Accession #:    [id number]          Weight:       218.7 lb Date of Birth:  11/19/1929            BSA:  2.12 m Patient Age:    89 years            BP:           110/72 mmHg Patient Gender: M                   HR:           52 bpm. Exam Location:  Inpatient Procedure: 2D Echo and Intracardiac Opacification Agent Indications:    CHF-Acute Systolic 939.03 / E09.23  History:        Patient has prior history of Echocardiogram examinations, most                 recent 02/19/2018. Pacemaker, Arrythmias:Atrial Fibrillation,                 Signs/Symptoms:Dyspnea; Risk Factors:Hypertension, Dyslipidemia                 and Diabetes. Cardiomyoapthy. Lower extremity edema.  Sonographer:    Darlina Sicilian RDCS Referring Phys: [place]  1. Left ventricular ejection fraction, by visual estimation, is 30 to 35%. The left ventricle has moderate to severely decreased function. There is no left ventricular hypertrophy.  2. Definity contrast agent  was given IV to delineate the left ventricular endocardial borders.  3. Left ventricular diastolic function could not be evaluated.  4. The left ventricle demonstrates global hypokinesis.  5. Global right ventricle has normal systolic function.The right ventricular size is normal. No increase in right ventricular wall thickness.  6. Left atrial size was severely dilated.  7. Right atrial size was severely dilated.  8. The mitral valve is normal in structure. Mild to moderate mitral valve regurgitation. No evidence of mitral stenosis.  9. The tricuspid valve is normal in structure. 10. The aortic valve is normal in structure. Aortic valve regurgitation is not visualized. Mild to moderate aortic valve sclerosis/calcification without any evidence of aortic stenosis. 11. The pulmonic valve was normal in structure. Pulmonic valve regurgitation is trivial. 12. Aortic dilatation noted. 13. There is mild dilatation of the ascending aorta measuring 42 mm. 14. Compared with the echo 02/2018, the ascending aorta has increased from 3.8 cm to 4.2 cm. 15. Moderately elevated pulmonary artery systolic pressure. 16. A pacer wire is visualized. 17. The inferior vena cava is normal in size with greater than 50% respiratory variability, suggesting right atrial pressure of 3 mmHg. FINDINGS  Left Ventricle: Left ventricular ejection fraction, by visual estimation, is 30 to 35%. The left ventricle has moderate to severely decreased function. Definity contrast agent was given IV to delineate the left ventricular endocardial borders. The left ventricle demonstrates global hypokinesis. There is no left ventricular hypertrophy. The left ventricular diastology could not be evaluated due to atrial fibrillation. Left ventricular diastolic function could not be evaluated. Normal left atrial pressure. Right Ventricle: The right ventricular size is normal. No increase in right ventricular wall thickness. Global RV systolic function is has normal  systolic function. The tricuspid regurgitant velocity is 2.51 m/s, and with an assumed right atrial pressure  of 15 mmHg, the estimated right ventricular systolic pressure is moderately elevated at 40.2 mmHg. Left Atrium: Left atrial size was severely dilated. Right Atrium: Right atrial size was severely dilated Pericardium: There is no evidence of pericardial effusion. Mitral Valve: The mitral valve is normal in structure. Mild to moderate mitral valve regurgitation. No evidence of mitral valve stenosis by observation. Tricuspid Valve: The tricuspid valve is normal in  structure. Tricuspid valve regurgitation mild-moderate. Aortic Valve: The aortic valve is normal in structure. Aortic valve regurgitation is not visualized. Mild to moderate aortic valve sclerosis/calcification is present, without any evidence of aortic stenosis. There is moderate calcification of the aortic valve. Aortic valve mean gradient measures 3.5 mmHg. Aortic valve peak gradient measures 6.2 mmHg. Aortic valve area, by VTI measures 3.69 cm. Pulmonic Valve: The pulmonic valve was normal in structure. Pulmonic valve regurgitation is trivial. Pulmonic regurgitation is trivial. Aorta: The aortic root, ascending aorta and aortic arch are all structurally normal, with no evidence of dilitation or obstruction and aortic dilatation noted. There is mild dilatation of the ascending aorta measuring 42 mm. Compared with the echo 02/2018, the ascending aorta has increased from 3.8 cm to 4.2 cm. Venous: The inferior vena cava is normal in size with greater than 50% respiratory variability, suggesting right atrial pressure of 3 mmHg. IAS/Shunts: No atrial level shunt detected by color flow Doppler. There is no evidence of a patent foramen ovale. No ventricular septal defect is seen or detected. There is no evidence of an atrial septal defect. Additional Comments: A pacer wire is visualized.  LEFT VENTRICLE PLAX 2D LVIDd:         6.97 cm LVIDs:         6.06  cm LV PW:         1.18 cm LV IVS:        0.79 cm LVOT diam:     2.60 cm LV SV:         69 ml LV SV Index:   31.06 LVOT Area:     5.31 cm  LV Volumes (MOD) LV area d, A2C:    48.70 cm LV area d, A4C:    46.18 cm LV area s, A2C:    37.30 cm LV area s, A4C:    36.40 cm LV major d, A2C:   8.37 cm LV major d, A4C:   8.56 cm LV major s, A2C:   7.11 cm LV major s, A4C:   8.83 cm LV vol d, MOD A2C: 227.0 ml LV vol d, MOD A4C: 208.8 ml LV vol s, MOD A2C: 158.0 ml LV vol s, MOD A4C: 124.0 ml LV SV MOD A2C:     69.0 ml LV SV MOD A4C:     208.8 ml LV SV MOD BP:      62.7 ml RIGHT VENTRICLE TAPSE (M-mode): 1.7 cm LEFT ATRIUM              Index       RIGHT ATRIUM           Index LA diam:        5.50 cm  2.59 cm/m  RA Area:     31.50 cm LA Vol (A2C):   166.0 ml 78.20 ml/m RA Volume:   107.00 ml 50.40 ml/m LA Vol (A4C):   173.0 ml 81.49 ml/m LA Biplane Vol: 170.0 ml 80.08 ml/m  AORTIC VALVE AV Area (Vmax):    3.96 cm AV Area (Vmean):   3.75 cm AV Area (VTI):     3.69 cm AV Vmax:           124.00 cm/s AV Vmean:          85.300 cm/s AV VTI:            0.171 m AV Peak Grad:      6.2 mmHg AV Mean Grad:      3.5 mmHg  LVOT Vmax:         92.40 cm/s LVOT Vmean:        60.200 cm/s LVOT VTI:          0.119 m LVOT/AV VTI ratio: 0.70  AORTA Ao Root diam: 3.40 cm Ao Asc diam:  4.20 cm MR Peak grad: 70.6 mmHg   TRICUSPID VALVE MR Mean grad: 47.0 mmHg   TR Peak grad:   25.2 mmHg MR Vmax:      420.00 cm/s TR Vmax:        251.00 cm/s MR Vmean:     324.0 cm/s                           SHUNTS                           Systemic VTI:  0.12 m                           Systemic Diam: 2.60 cm  Skeet Latch MD Electronically signed by Skeet Latch MD Signature Date/Time: 12/12/2019/2:21:27 PM    Final    {  Assessment and Plan:   Acute on Chronic Systolic CHF/Non-Ischemic Cardiomyopathy  - Patient presented with worsening dyspnea on exertion and lower extremity edema. Weight up almost 10 lbs since last office visit in 07/2019.  -  BNP elevated at 1,035.  - Chest x-ray showed hazy airspace opacity within right mid and lower lung fields suspicious for PNA as well as possible trace bilateral pleural effusions.  - Echo this admission showed  LVEF of 30-35% (down from 40-45% in 02/2018) with global hypokinesis, severe biatrial enlargement, mild to moderate MR, and mild dilatation of the ascending aorta measuring 42 mm (increaed from 19m in 02/2018).  - Started on IV Lasix 470mtwice daily with not much symptomatic improvement. Documented urinary output of 1.5 L over the last 24 hours but overall only net negative 520 mL since admission. Weight 219.8 lbs today down from 224 lbs on admission (weight was 215 lbs at last office visit). Renal function stable.  - Patient has chronic lower extremity edema and do not appreciate significant crackles on exam. Lungs sound very tight with expiratory wheezing. Dyspnea may be more related to possible PNA. Continue current dose of Lasix for now.  - Currently on Lisinopril 2.47m18maily. Do not know if patient would tolerate Entresto but could consider transitioning to this. - Patient not on beta-blocker at home. He has a history of bradycardia but does have a PPM now. Would consider adding Toprol given reduced EF and tachycardia. - Continue to monitor daily weight, strict I/O's, and renal function.  Permanent Atrial Fibrillation - Rates currently in the 80s' to low 120's. Wonder if faster rates contributing to drop in EF. - Potassium 3.6. Goal > 4.0. Supplement as needed. - Will check Magnesium. - Patient not on beta-blocker at home. He has a history of bradycardia but does have a PPM now. Would consider adding Toprol given reduced EF and tachycardia. Will discuss with MD. - Continue chronic anticoagulation with Eliquis 47mg42mice daily.  - May need to interrogate device to see how long he has been in RVR.  Elevated Troponin - High-sensitivity troponin minimally elevated and flat at 41 >>42.  - No  angina.  - Not consistent with ACS. Suspect demand ischemia in setting of acute on  chronic CHF, atrial fibrillation with RVR, and possible PNA. Do no suspect any additional ischemic evaluation.  Hypertension - BP well controlled.  - Continue current medications.  Hyperlipidemia - Continue home statin.  Type 2 Diabetes Mellitus - Management per primary team.  Possible Pneumonia - Chest x-ray suspicious for pneumonia. - No fever or leukocytosis.  - Has been started on Rocephin. - Management per primary team.   For questions or updates, please contact [place] Please consult [email or web address] for contact info under   Signed, Darreld Mclean, PA-C  12/13/2019 9:29 AM  The patient was seen, examined and discussed with Darreld Mclean, PA-C  and I agree with the above.   86 year old male with the above history who is followed by Dr. [name] and Dr. Lovena Le. Most recent Echo from 02/2018 showed LVEF of 40-45%, moderate LVH, moderate LV enlargement, severe biatrial enlargement, and mild to moderate MR. Patient was last seen by Dr. [name] in 07/2019 at which time he was doing well from a cardiac standpoint. He noted chronic pedal edema which was stable on Lasix '80mg'$  daily but denied any chest pain, dyspnea, palpitations, or syncope.  [facility]2019 for worsening shortness of breath. EKG showed v-pacing with underlying rapid atrial fibrillation. High-sensitivity troponin minimally elevated and flat at 41 >> 42. BNP elevated at 1,035.   On physical exam patient is gasping for breath when talking, laying in 45 degrees, he has JVDs up to his jaws, he has bilateral crackles up to mid lungs.  He has chronic mild lower extremity edema and skin lesions consistent with chronic venous stasis.  Patient is slightly confused.    Chest x-ray showed hazy opacity within the right mid and lower lung fields suspicious for pneumonia as well as possible trace bilateral pleural  effusions.  He was started on ceftriaxone.  He is Covid negative.    Patient was admitted for acute on chronic CHF and started on IV Lasix '40mg'$  twice daily, I would increase to 40 mg every 6 hours.. Repeat Echo yesterday showed LVEF of 30-35% (down from 40-45% in 02/2018) with global hypokinesis, severe biatrial enlargement, mild to moderate MR, and mild dilatation of the ascending aorta measuring 42 mm (increaed from 74m in 02/2018).   His baseline weight in August 2020 was 97.5 kg, on admission 99.7 kg, his creatinine is stable,.  We will continue diuresis for now and monitor closely.  KEna Dawley MD 12/13/2019

## 2019-12-14 ENCOUNTER — Other Ambulatory Visit: Payer: Self-pay

## 2019-12-14 ENCOUNTER — Inpatient Hospital Stay (HOSPITAL_COMMUNITY): Payer: Medicare HMO

## 2019-12-14 LAB — GLUCOSE, CAPILLARY
Glucose-Capillary: 144 mg/dL — ABNORMAL HIGH (ref 70–99)
Glucose-Capillary: 192 mg/dL — ABNORMAL HIGH (ref 70–99)
Glucose-Capillary: 143 mg/dL — ABNORMAL HIGH (ref 70–99)
Glucose-Capillary: 148 mg/dL — ABNORMAL HIGH (ref 70–99)

## 2019-12-14 LAB — BASIC METABOLIC PANEL
Anion gap: 11 (ref 5–15)
BUN: 34 mg/dL — ABNORMAL HIGH (ref 8–23)
CO2: 29 mmol/L (ref 22–32)
Calcium: 8.4 mg/dL — ABNORMAL LOW (ref 8.9–10.3)
Chloride: 99 mmol/L (ref 98–111)
Creatinine, Ser: 1.21 mg/dL (ref 0.61–1.24)
GFR calc Af Amer: >60 mL/min (ref >60)
GFR calc non Af Amer: 53 mL/min — ABNORMAL LOW (ref >60)
Glucose, Bld: 150 mg/dL — ABNORMAL HIGH (ref 70–99)
Potassium: 3.3 mmol/L — ABNORMAL LOW (ref 3.5–5.1)
Sodium: 139 mmol/L (ref 135–145)

## 2019-12-14 MED ORDER — POTASSIUM CHLORIDE CRYS ER 20 MEQ PO TBCR
40.0000 meq | EXTENDED_RELEASE_TABLET | Freq: Two times a day (BID) | ORAL | Status: DC
  Administered 2019-12-14 – 2019-12-16 (×6): 40 meq via ORAL
  Filled 2019-12-14 (×6): qty 2

## 2019-12-14 NOTE — Plan of Care (Signed)
  Problem: Elimination: Goal: Will not experience complications related to urinary retention Outcome: Progressing   Problem: Safety: Goal: Ability to remain free from injury will improve Outcome: Progressing   

## 2019-12-14 NOTE — Progress Notes (Addendum)
Progress Note  Patient Name: [name] Date of Encounter: 12/14/2019  Primary Cardiologist: Olga Millers, MD   Subjective   Patient put out 2.7 L urine in the last 24 hours. No chest pain. On 3L O2  Inpatient Medications    Scheduled Meds: . allopurinol  100 mg Oral Daily  . apixaban  5 mg Oral BID  . atorvastatin  20 mg Oral QPC supper  . cholecalciferol  1,000 Units Oral Daily  . docusate sodium  100 mg Oral QPC supper  . donepezil  5 mg Oral QHS  . furosemide  40 mg Intravenous Q6H  . insulin aspart  0-5 Units Subcutaneous QHS  . insulin aspart  0-9 Units Subcutaneous TID WC  . lisinopril  2.5 mg Oral Daily  . magnesium oxide  400 mg Oral 2 times per day  . Melatonin  4.5 mg Oral QHS  . potassium chloride SA  20 mEq Oral Daily  . sodium chloride flush  3 mL Intravenous Q12H  . tamsulosin  0.4 mg Oral BID PC   Continuous Infusions: . sodium chloride    . cefTRIAXone (ROCEPHIN)  IV Stopped (12/13/19 2024)   PRN Meds: sodium chloride, acetaminophen, albuterol, ondansetron (ZOFRAN) IV, sodium chloride flush   Vital Signs    Vitals:   12/13/19 1553 12/13/19 2005 12/13/19 2007 12/14/19 0448  BP:  102/76  97/76  Pulse: (!) 52 (!) 50 89 (!) 56  Resp: 16 18  18   Temp:  (!) 97.5 F (36.4 C)  (!) 97.5 F (36.4 C)  TempSrc:  Oral  Oral  SpO2: 100% 100%  99%  Weight:    100.4 kg  Height:        Intake/Output Summary (Last 24 hours) at 12/14/2019 0924 Last data filed at 12/14/2019 0830 Gross per 24 hour  Intake 1830 ml  Output 2850 ml  Net -1020 ml   Last 3 Weights 12/14/2019 12/13/2019 12/12/2019  Weight (lbs) 221 lb 5.5 oz 219 lb 12.8 oz 218 lb 11.1 oz  Weight (kg) 100.4 kg 99.7 kg 99.2 kg      Telemetry    Afib underlying rhythm, with intermittent V-paced and A-paced beates. Rates around 100 - Personally Reviewed  ECG    No new - Personally Reviewed  Physical Exam   GEN: No acute distress.   Neck: No JVD Cardiac: Irreg Irreg, no murmurs,  rubs, or gallops.  Respiratory: Crackles at bases bilaterally; 3L O2 GI: Soft, nontender, non-distended  MS: Trace pedal edema; No deformity. Neuro:  Nonfocal  Psych: Normal affect   Labs    High Sensitivity Troponin:   Recent Labs  Lab 12/11/19 1338 12/11/19 1638  TROPONINIHS 41* 42*      Chemistry Recent Labs  Lab 12/11/19 1338 12/12/19 0415 12/13/19 0850 12/14/19 0708  NA  --  141 138 139  K  --  3.5 3.6 3.3*  CL  --  105 102 99  CO2  --  25 27 29   GLUCOSE  --  151* 173* 150*  BUN  --  32* 32* 34*  CREATININE  --  1.08 1.13 1.21  CALCIUM  --  8.9 8.4* 8.4*  PROT 6.4*  --   --   --   ALBUMIN 3.4*  --   --   --   AST 28  --   --   --   ALT 31  --   --   --   ALKPHOS 83  --   --   --  BILITOT 1.3*  --   --   --   GFRNONAA  --  >60 58* 53*  GFRAA  --  >60 >60 >60  ANIONGAP  --  11 9 11      Hematology Recent Labs  Lab 12/07/19 1655 12/11/19 1245 12/12/19 0415  WBC 7.8 8.5 7.3  RBC 3.93* 3.96* 3.72*  HGB 12.4* 12.6* 11.8*  HCT 38.0* 38.1* 35.7*  MCV 96.7 96.2 96.0  MCH  --  31.8 31.7  MCHC 32.7 33.1 33.1  RDW 14.7 14.5 14.5  PLT 125.0* 136* 122*    BNP Recent Labs  Lab 12/11/19 1638  BNP 1,035.4*     DDimer No results for input(s): DDIMER in the last 168 hours.   Radiology    DG CHEST PORT 1 VIEW  Result Date: 12/12/2019 CLINICAL DATA:  Shortness of breath EXAM: PORTABLE CHEST 1 VIEW COMPARISON:  12/11/2019 FINDINGS: Cardiac shadow remains enlarged. Pacing device is again noted and stable. Patchy airspace opacities are again seen and stable. The overall inspiratory effort is poor. No bony abnormality is noted. IMPRESSION: Stable patchy infiltrates bilaterally. Electronically Signed   By: Alcide Clever M.D.   On: 12/12/2019 11:46   ECHOCARDIOGRAM COMPLETE  Result Date: 12/12/2019   ECHOCARDIOGRAM REPORT   Patient Name:   [name] Date of Exam: 12/12/2019 Medical Rec #:  [id number]           Height:       68.0 in Accession #:    [id number]           Weight:       218.7 lb Date of Birth:  [birth year]            BSA:          2.12 m Patient Age:    86 years            BP:           110/72 mmHg Patient Gender: M                   HR:           52 bpm. Exam Location:  Inpatient Procedure: 2D Echo and Intracardiac Opacification Agent Indications:    CHF-Acute Systolic 428.21 / I50.21  History:        Patient has prior history of Echocardiogram examinations, most                 recent 02/19/2018. Pacemaker, Arrythmias:Atrial Fibrillation,                 Signs/Symptoms:Dyspnea; Risk Factors:Hypertension, Dyslipidemia                 and Diabetes. Cardiomyoapthy. Lower extremity edema.  Sonographer:    Leta Jungling RDCS Referring Phys: [id number] MOHAMMAD L GARBA IMPRESSIONS  1. Left ventricular ejection fraction, by visual estimation, is 30 to 35%. The left ventricle has moderate to severely decreased function. There is no left ventricular hypertrophy.  2. Definity contrast agent was given IV to delineate the left ventricular endocardial borders.  3. Left ventricular diastolic function could not be evaluated.  4. The left ventricle demonstrates global hypokinesis.  5. Global right ventricle has normal systolic function.The right ventricular size is normal. No increase in right ventricular wall thickness.  6. Left atrial size was severely dilated.  7. Right atrial size was severely dilated.  8. The mitral valve is normal in structure. Mild to moderate mitral valve regurgitation. No  evidence of mitral stenosis.  9. The tricuspid valve is normal in structure. 10. The aortic valve is normal in structure. Aortic valve regurgitation is not visualized. Mild to moderate aortic valve sclerosis/calcification without any evidence of aortic stenosis. 11. The pulmonic valve was normal in structure. Pulmonic valve regurgitation is trivial. 12. Aortic dilatation noted. 13. There is mild dilatation of the ascending aorta measuring 42 mm. 14. Compared with the echo 02/2018, the  ascending aorta has increased from 3.8 cm to 4.2 cm. 15. Moderately elevated pulmonary artery systolic pressure. 16. A pacer wire is visualized. 17. The inferior vena cava is normal in size with greater than 50% respiratory variability, suggesting right atrial pressure of 3 mmHg. FINDINGS  Left Ventricle: Left ventricular ejection fraction, by visual estimation, is 30 to 35%. The left ventricle has moderate to severely decreased function. Definity contrast agent was given IV to delineate the left ventricular endocardial borders. The left ventricle demonstrates global hypokinesis. There is no left ventricular hypertrophy. The left ventricular diastology could not be evaluated due to atrial fibrillation. Left ventricular diastolic function could not be evaluated. Normal left atrial pressure. Right Ventricle: The right ventricular size is normal. No increase in right ventricular wall thickness. Global RV systolic function is has normal systolic function. The tricuspid regurgitant velocity is 2.51 m/s, and with an assumed right atrial pressure  of 15 mmHg, the estimated right ventricular systolic pressure is moderately elevated at 40.2 mmHg. Left Atrium: Left atrial size was severely dilated. Right Atrium: Right atrial size was severely dilated Pericardium: There is no evidence of pericardial effusion. Mitral Valve: The mitral valve is normal in structure. Mild to moderate mitral valve regurgitation. No evidence of mitral valve stenosis by observation. Tricuspid Valve: The tricuspid valve is normal in structure. Tricuspid valve regurgitation mild-moderate. Aortic Valve: The aortic valve is normal in structure. Aortic valve regurgitation is not visualized. Mild to moderate aortic valve sclerosis/calcification is present, without any evidence of aortic stenosis. There is moderate calcification of the aortic valve. Aortic valve mean gradient measures 3.5 mmHg. Aortic valve peak gradient measures 6.2 mmHg. Aortic valve  area, by VTI measures 3.69 cm. Pulmonic Valve: The pulmonic valve was normal in structure. Pulmonic valve regurgitation is trivial. Pulmonic regurgitation is trivial. Aorta: The aortic root, ascending aorta and aortic arch are all structurally normal, with no evidence of dilitation or obstruction and aortic dilatation noted. There is mild dilatation of the ascending aorta measuring 42 mm. Compared with the echo 02/2018, the ascending aorta has increased from 3.8 cm to 4.2 cm. Venous: The inferior vena cava is normal in size with greater than 50% respiratory variability, suggesting right atrial pressure of 3 mmHg. IAS/Shunts: No atrial level shunt detected by color flow Doppler. There is no evidence of a patent foramen ovale. No ventricular septal defect is seen or detected. There is no evidence of an atrial septal defect. Additional Comments: A pacer wire is visualized.  LEFT VENTRICLE PLAX 2D LVIDd:         6.97 cm LVIDs:         6.06 cm LV PW:         1.18 cm LV IVS:        0.79 cm LVOT diam:     2.60 cm LV SV:         69 ml LV SV Index:   31.06 LVOT Area:     5.31 cm  LV Volumes (MOD) LV area d, A2C:    48.70 cm LV  area d, A4C:    46.18 cm LV area s, A2C:    37.30 cm LV area s, A4C:    36.40 cm LV major d, A2C:   8.37 cm LV major d, A4C:   8.56 cm LV major s, A2C:   7.11 cm LV major s, A4C:   8.83 cm LV vol d, MOD A2C: 227.0 ml LV vol d, MOD A4C: 208.8 ml LV vol s, MOD A2C: 158.0 ml LV vol s, MOD A4C: 124.0 ml LV SV MOD A2C:     69.0 ml LV SV MOD A4C:     208.8 ml LV SV MOD BP:      62.7 ml RIGHT VENTRICLE TAPSE (M-mode): 1.7 cm LEFT ATRIUM              Index       RIGHT ATRIUM           Index LA diam:        5.50 cm  2.59 cm/m  RA Area:     31.50 cm LA Vol (A2C):   166.0 ml 78.20 ml/m RA Volume:   107.00 ml 50.40 ml/m LA Vol (A4C):   173.0 ml 81.49 ml/m LA Biplane Vol: 170.0 ml 80.08 ml/m  AORTIC VALVE AV Area (Vmax):    3.96 cm AV Area (Vmean):   3.75 cm AV Area (VTI):     3.69 cm AV Vmax:            124.00 cm/s AV Vmean:          85.300 cm/s AV VTI:            0.171 m AV Peak Grad:      6.2 mmHg AV Mean Grad:      3.5 mmHg LVOT Vmax:         92.40 cm/s LVOT Vmean:        60.200 cm/s LVOT VTI:          0.119 m LVOT/AV VTI ratio: 0.70  AORTA Ao Root diam: 3.40 cm Ao Asc diam:  4.20 cm MR Peak grad: 70.6 mmHg   TRICUSPID VALVE MR Mean grad: 47.0 mmHg   TR Peak grad:   25.2 mmHg MR Vmax:      420.00 cm/s TR Vmax:        251.00 cm/s MR Vmean:     324.0 cm/s                           SHUNTS                           Systemic VTI:  0.12 m                           Systemic Diam: 2.60 cm  Skeet Latch MD Electronically signed by Skeet Latch MD Signature Date/Time: 12/12/2019/2:21:27 PM    Final     Cardiac Studies   Echocardiogram 12/12/2019: Impressions: 1. Left ventricular ejection fraction, by visual estimation, is 30 to 35%. The left ventricle has moderate to severely decreased function. There is no left ventricular hypertrophy. 2. Definity contrast agent was given IV to delineate the left ventricular endocardial borders. 3. Left ventricular diastolic function could not be evaluated. 4. The left ventricle demonstrates global hypokinesis. 5. Global right ventricle has normal systolic function.The right ventricular size is normal. No increase in right ventricular  wall thickness. 6. Left atrial size was severely dilated. 7. Right atrial size was severely dilated. 8. The mitral valve is normal in structure. Mild to moderate mitral valve regurgitation. No evidence of mitral stenosis. 9. The tricuspid valve is normal in structure. 10. The aortic valve is normal in structure. Aortic valve regurgitation is not visualized. Mild to moderate aortic valve sclerosis/calcification without any evidence of aortic stenosis. 11. The pulmonic valve was normal in structure. Pulmonic valve regurgitation is trivial. 12. Aortic dilatation noted. 13. There is mild dilatation of the ascending aorta  measuring 42 mm. 14. Compared with the echo 02/2018, the ascending aorta has increased from 3.8 cm to 4.2 cm. 15. Moderately elevated pulmonary artery systolic pressure. 16. A pacer wire is visualized. 17. The inferior vena cava is normal in size with greater than 50% respiratory variability, suggesting right atrial pressure of 3 mmHg.  Patient Profile     86 y.o. male with pmh of normal coronaries on cardiac cath in 2006, chronic systolic CHF, non-ischemic cardiomyopathy with EF 40-45% on Echo 2019, s/p BiV ICD placement in 2013 with was downgraded to BiV PPM in 07/2016, permanent Afib on Eliquis, HTN, hyperlipidemia, DM2, BPH who is being seen for acute CHF.   Assessment & Plan    Acute on Chronic Systolic CHF/Non-ischemic cardiomyopathy Patient presented with dyspnea and 10 lb weight gain. BNP elevated to 1,035, CXR with possible PNA and trace bilateral pleural effusions. At home patient was on Lasix 40 mg BID - Started on IV lasix 40 mg>>increased to every 6 hours - Echo this admission showed LVEF 30-35% (down from 40-45% in 2019) -  In the last 24 hours patient has put out 2.7 L. Net since admission -1.2 L, - Weight does not appear accurate. Will re-weigh - Creatinine 1.13 > 1.21 - Continue Lisinopril - Not on BB at baseline however patient now has PPM - Patient has pedal edema on exam. Might need another day of diuresis. Can also consider decreasing Lasix frequency given rise in creatinine  Permanent Afib - Rates - Potassium 3.3> will repleat - Mag 1.6 >>supplement  PNA - CXR suspicious for PNA - on abx per IM  Elevated Troponin - HS troponin minimally elevated, peak 42. Trend not consistent with ACS - Likely demand ischemia In the setting of acute HF, Afib RVR, and possible PNA - No chest pain  HTN - BP controlled  HLD - continue home statin  DM2 - SSI per IM   For questions or updates, please contact [facility] Please consult [email or web address] for contact info  under   Signed, Cadence David Stall, PA-C  12/14/2019, 9:24 AM   As above, patient seen and examined.  Patient admitted with acute on chronic systolic congestive heart failure.  Dyspnea mildly improved.  No chest pain.  We will continue Lasix at present dose.  I will continue lisinopril for now.  Would not add Entresto as he has had problems with orthostatic hypotension in the past.  No beta-blocker for same reason.  We will consider pending follow-up blood pressures.  Follow renal function.  Olga Millers

## 2019-12-14 NOTE — Discharge Instructions (Signed)

## 2019-12-14 NOTE — Progress Notes (Signed)
Hospitalist progress note   [employer] [place] [id number] DOB: [birth year] DOA: 12/11/2019  PCP: Wynn Banker, MD   Narrative:  86 year old white male nonischemic cardiomyopathy (cardiac cath 12/2004 no CAD) most recent EF 30-35% left bundle branch block + CRT-D placement 10/14/2012 chronic atrial fibrillation previously Coumadin, HTN, BPH, HLD, class III CHF Admit to emergency room 12/11/2019 with lower extremity edema worsening exertional dyspnea Cardiology consulted for management  Data Reviewed:  Potassium 3.3 BUN/creatinine 34/1.2 (baseline seems 20/1.0) Hemoglobin 11.8 platelet 122  Assessment & Plan: Acute decompensated systolic heart failure EF down from prior this admission to 30-25% cardiology consulted Continuing at this time Lasix 40 every 6 hourly as per cardiology continue lisinopril 2.5 daily Not on beta-blocker prior to admission ?  Pneumonia- CXR 1/2 showed patchy bilateral infiltrates-we will obtain procalcitonin and white count in a.m. if not done recently Would now off Rocephin that he is on is this seems to have resolved with diuresis as I will be more indicative of heart failure Permanent atrial fibrillation currently in sinus rhythm on monitors on Eliquis status post pacemaker placement 10/2012 Continue Eliquis 5 mg twice daily at this time-currently is in sinus Monitor trends-check magnesium in a.m. DM TY 2 A1c this admission 7.0 No prior diagnosis requiring minimal amounts of insulin Likely transition next 24 hours to Amaryl and monitor trends he is eating 100%-we will discuss with him BPH Continue Flomax 0.4 twice daily Mild dementia Continue Aricept 5 mg at bedtime Gout Continue allopurinol 100 daily  On Eliquis, full code No family present at bedside.  Await cardiology further input    Subjective: Awake coherent having some back pain otherwise feels well breathing okay He was off of his nasal oxygen but seemed comfortable no chest pain no fever  no chills Consultants:   Cardiology Antimicrobials:   Rocephin 1/3   Objective: Vitals:   12/13/19 2007 12/14/19 0448 12/14/19 0900 12/14/19 1125  BP:  97/76  113/71  Pulse: 89 (!) 56  (!) 53  Resp:  18  20  Temp:  (!) 97.5 F (36.4 C)  (!) 97.4 F (36.3 C)  TempSrc:  Oral  Oral  SpO2:  99%  96%  Weight:  100.4 kg 100.2 kg   Height:        Intake/Output Summary (Last 24 hours) at 12/14/2019 1148 Last data filed at 12/14/2019 1030 Gross per 24 hour  Intake 1780 ml  Output 2900 ml  Net -1120 ml   Filed Weights   12/13/19 0437 12/14/19 0448 12/14/19 0900  Weight: 99.7 kg 100.4 kg 100.2 kg    Examination: Awake coherent no distress  S1-S2 regular rate rhythm Chest diminished posterolaterally however no rales no rhonchi Abdomen soft Bilateral lower extremity stasis dermatitis noted no significant swelling may be just +1 edema Neurologically intact to gross motor moving all 4 limbs equally   Scheduled Meds: . allopurinol  100 mg Oral Daily  . apixaban  5 mg Oral BID  . atorvastatin  20 mg Oral QPC supper  . cholecalciferol  1,000 Units Oral Daily  . docusate sodium  100 mg Oral QPC supper  . donepezil  5 mg Oral QHS  . furosemide  40 mg Intravenous Q6H  . insulin aspart  0-5 Units Subcutaneous QHS  . insulin aspart  0-9 Units Subcutaneous TID WC  . lisinopril  2.5 mg Oral Daily  . magnesium oxide  400 mg Oral 2 times per day  . Melatonin  4.5 mg Oral QHS  .  potassium chloride SA  40 mEq Oral BID  . sodium chloride flush  3 mL Intravenous Q12H  . tamsulosin  0.4 mg Oral BID PC   Continuous Infusions: . sodium chloride    . cefTRIAXone (ROCEPHIN)  IV Stopped (12/13/19 2024)     LOS: 3 days   Time spent: [place], MD [facility]  12/14/2019, 11:48 AM

## 2019-12-15 LAB — CBC WITH DIFFERENTIAL/PLATELET
Abs Immature Granulocytes: 0.04 10*3/uL (ref 0.00–0.07)
Basophils Absolute: 0 10*3/uL (ref 0.0–0.1)
Basophils Relative: 0 %
Eosinophils Absolute: 0.1 10*3/uL (ref 0.0–0.5)
Eosinophils Relative: 1 %
HCT: 37.6 % — ABNORMAL LOW (ref 39.0–52.0)
Hemoglobin: 12.8 g/dL — ABNORMAL LOW (ref 13.0–17.0)
Immature Granulocytes: 1 %
Lymphocytes Relative: 15 %
Lymphs Abs: 1.1 10*3/uL (ref 0.7–4.0)
MCH: 32 pg (ref 26.0–34.0)
MCHC: 34 g/dL (ref 30.0–36.0)
MCV: 94 fL (ref 80.0–100.0)
Monocytes Absolute: 0.9 10*3/uL (ref 0.1–1.0)
Monocytes Relative: 12 %
Neutro Abs: 5.1 10*3/uL (ref 1.7–7.7)
Neutrophils Relative %: 71 %
Platelets: 108 10*3/uL — ABNORMAL LOW (ref 150–400)
RBC: 4 MIL/uL — ABNORMAL LOW (ref 4.22–5.81)
RDW: 14.1 % (ref 11.5–15.5)
WBC: 7.2 10*3/uL (ref 4.0–10.5)
nRBC: 0 % (ref 0.0–0.2)

## 2019-12-15 LAB — BASIC METABOLIC PANEL
Anion gap: 13 (ref 5–15)
BUN: 33 mg/dL — ABNORMAL HIGH (ref 8–23)
CO2: 28 mmol/L (ref 22–32)
Calcium: 8.6 mg/dL — ABNORMAL LOW (ref 8.9–10.3)
Chloride: 96 mmol/L — ABNORMAL LOW (ref 98–111)
Creatinine, Ser: 1.01 mg/dL (ref 0.61–1.24)
GFR calc Af Amer: >60 mL/min (ref >60)
GFR calc non Af Amer: >60 mL/min (ref >60)
Glucose, Bld: 141 mg/dL — ABNORMAL HIGH (ref 70–99)
Potassium: 3.8 mmol/L (ref 3.5–5.1)
Sodium: 137 mmol/L (ref 135–145)

## 2019-12-15 LAB — GLUCOSE, CAPILLARY
Glucose-Capillary: 139 mg/dL — ABNORMAL HIGH (ref 70–99)
Glucose-Capillary: 173 mg/dL — ABNORMAL HIGH (ref 70–99)
Glucose-Capillary: 137 mg/dL — ABNORMAL HIGH (ref 70–99)
Glucose-Capillary: 170 mg/dL — ABNORMAL HIGH (ref 70–99)

## 2019-12-15 LAB — MAGNESIUM: Magnesium: 1.7 mg/dL (ref 1.7–2.4)

## 2019-12-15 LAB — PROCALCITONIN: Procalcitonin: <0.1 ng/mL

## 2019-12-15 MED ORDER — FUROSEMIDE 10 MG/ML IJ SOLN
60.0000 mg | Freq: Two times a day (BID) | INTRAMUSCULAR | Status: DC
  Administered 2019-12-15 (×2): 60 mg via INTRAVENOUS
  Filled 2019-12-15 (×3): qty 6

## 2019-12-15 MED ORDER — AZITHROMYCIN 500 MG PO TABS
500.0000 mg | ORAL_TABLET | Freq: Every day | ORAL | Status: AC
  Administered 2019-12-15 – 2019-12-16 (×2): 500 mg via ORAL
  Filled 2019-12-15 (×2): qty 1

## 2019-12-15 MED ORDER — CARVEDILOL 3.125 MG PO TABS
3.1250 mg | ORAL_TABLET | Freq: Two times a day (BID) | ORAL | Status: DC
  Administered 2019-12-15 – 2019-12-18 (×6): 3.125 mg via ORAL
  Filled 2019-12-15 (×6): qty 1

## 2019-12-15 MED ORDER — GLIMEPIRIDE 1 MG PO TABS
1.0000 mg | ORAL_TABLET | Freq: Every day | ORAL | Status: DC
  Administered 2019-12-16: 09:00:00 1 mg via ORAL
  Filled 2019-12-15: qty 1

## 2019-12-15 MED ORDER — MAGNESIUM SULFATE IN D5W 1-5 GM/100ML-% IV SOLN
1.0000 g | Freq: Once | INTRAVENOUS | Status: AC
  Administered 2019-12-15: 1 g via INTRAVENOUS
  Filled 2019-12-15: qty 100

## 2019-12-15 NOTE — Progress Notes (Signed)
Progress Note  Patient Name: [name] Date of Encounter: 12/15/2019  Primary Cardiologist: Kirk Ruths, MD   Subjective   Dyspnea improving; no chest pain  Inpatient Medications    Scheduled Meds: . allopurinol  100 mg Oral Daily  . apixaban  5 mg Oral BID  . atorvastatin  20 mg Oral QPC supper  . cholecalciferol  1,000 Units Oral Daily  . docusate sodium  100 mg Oral QPC supper  . donepezil  5 mg Oral QHS  . furosemide  40 mg Intravenous Q6H  . insulin aspart  0-5 Units Subcutaneous QHS  . insulin aspart  0-9 Units Subcutaneous TID WC  . lisinopril  2.5 mg Oral Daily  . magnesium oxide  400 mg Oral 2 times per day  . Melatonin  4.5 mg Oral QHS  . potassium chloride SA  40 mEq Oral BID  . sodium chloride flush  3 mL Intravenous Q12H  . tamsulosin  0.4 mg Oral BID PC   Continuous Infusions: . sodium chloride    . cefTRIAXone (ROCEPHIN)  IV 2 g (12/14/19 1210)  . magnesium sulfate bolus IVPB     PRN Meds: sodium chloride, acetaminophen, albuterol, ondansetron (ZOFRAN) IV, sodium chloride flush   Vital Signs    Vitals:   12/14/19 0900 12/14/19 1125 12/14/19 2032 12/15/19 0429  BP:  113/71 134/86 118/71  Pulse:  (!) 53 (!) 102 (!) 54  Resp:  20 18 20   Temp:  (!) 97.4 F (36.3 C) 97.6 F (36.4 C) 98.6 F (37 C)  TempSrc:  Oral Oral   SpO2:  96% 100% 100%  Weight: 100.2 kg   99.5 kg  Height:        Intake/Output Summary (Last 24 hours) at 12/15/2019 0932 Last data filed at 12/15/2019 0804 Gross per 24 hour  Intake 900 ml  Output 2375 ml  Net -1475 ml   Last 3 Weights 12/15/2019 12/14/2019 12/14/2019  Weight (lbs) 219 lb 6.4 oz 220 lb 12.8 oz 221 lb 5.5 oz  Weight (kg) 99.519 kg 100.154 kg 100.4 kg      Telemetry    Atrial fibrillation with ventricular pacing- Personally Reviewed   Physical Exam   GEN: No acute distress.   Neck: No JVD Cardiac: irregular Respiratory: Clear to auscultation bilaterally. GI: Soft, nontender,  non-distended  MS: No edema; chronic skin changes Neuro:  Nonfocal  Psych: Normal affect   Labs    High Sensitivity Troponin:   Recent Labs  Lab 12/11/19 1338 12/11/19 1638  TROPONINIHS 41* 42*      Chemistry Recent Labs  Lab 12/11/19 1338 12/13/19 0850 12/14/19 0708 12/15/19 0453  NA  --  138 139 137  K  --  3.6 3.3* 3.8  CL  --  102 99 96*  CO2  --  27 29 28   GLUCOSE  --  173* 150* 141*  BUN  --  32* 34* 33*  CREATININE  --  1.13 1.21 1.01  CALCIUM  --  8.4* 8.4* 8.6*  PROT 6.4*  --   --   --   ALBUMIN 3.4*  --   --   --   AST 28  --   --   --   ALT 31  --   --   --   ALKPHOS 83  --   --   --   BILITOT 1.3*  --   --   --   GFRNONAA  --  58* 53* >60  GFRAA  --  >60 >60 >60  ANIONGAP  --  9 11 13      Hematology Recent Labs  Lab 12/11/19 1245 12/12/19 0415 12/15/19 0453  WBC 8.5 7.3 7.2  RBC 3.96* 3.72* 4.00*  HGB 12.6* 11.8* 12.8*  HCT 38.1* 35.7* 37.6*  MCV 96.2 96.0 94.0  MCH 31.8 31.7 32.0  MCHC 33.1 33.1 34.0  RDW 14.5 14.5 14.1  PLT 136* 122* PENDING    BNP Recent Labs  Lab 12/11/19 1638  BNP 1,035.4*      Radiology    DG Chest 2 View  Result Date: 12/14/2019 CLINICAL DATA:  Shortness of breath. Pneumonia. EXAM: CHEST - 2 VIEW COMPARISON:  One-view chest x-ray 12/12/2019. FINDINGS: The heart is enlarged. Pacing and defibrillator wires are stable. Right greater than left airspace disease is similar the prior study. Bilateral effusions are again noted, right greater than left. IMPRESSION: 1. Stable appearance of right greater than left airspace disease compatible with multifocal pneumonia. 2. Stable bilateral pleural effusions, right greater than left. Electronically Signed   By: 02/09/2020 M.D.   On: 12/14/2019 14:17    Patient Profile     86 y.o. male with pmh of normal coronaries on cardiac cath in 2006, chronic systolic CHF, non-ischemic cardiomyopathy, s/p BiV ICD placement in 2013 with was downgraded to BiV PPM in 07/2016,  permanent Afib on Eliquis, HTN, hyperlipidemia, DM2, BPH who is being seen for acute CHF.   Assessment & Plan    1 acute on chronic combined systolic/diastolic congestive heart failure-I/O-1635. Wt 99.5 kg.  Much improved.  Will change Lasix to 60 mg IV twice daily today.  Discontinue oxygen.  Ambulate today.  Possible discharge in the morning if patient stable.  2 nonischemic cardiomyopathy-continue ACE inhibitor.  Note patient has had problems with orthostasis as an outpatient and I have therefore not pushed medications.  I will add low-dose carvedilol.  We will advance as tolerated.  3 prior biventricular ICD  4 permanent atrial fibrillation-continue apixaban.  Add carvedilol as outlined above.  5 question pneumonia-antibiotics per primary care.  For questions or updates, please contact [facility] Please consult [email or web address] for contact info under        Signed, 08/2016, MD  12/15/2019, 9:33 AM

## 2019-12-15 NOTE — Progress Notes (Signed)
Hospitalist progress note   [employer] [id number] DOB: [birth year] DOA: 12/11/2019  PCP: Wynn Banker, MD   Narrative:  86 year old white male nonischemic cardiomyopathy (cardiac cath 12/2004 no CAD) most recent EF 30-35% left bundle branch block + CRT-D placement 10/14/2012 chronic atrial fibrillation previously Coumadin, HTN, BPH, HLD, class III CHF Admit to emergency room 12/11/2019 with lower extremity edema worsening exertional dyspnea Cardiology consulted for management  Data Reviewed:  Potassium 3.3-->3.8 BUN/creatinine 34/1.2-->33/1.01 (baseline seems 20/1.0) Hemoglobin 11.8 platelet 122 White count not elevated 7.2 CXR 1/4 shows right >left-sided airspace disease?  Pneumonia  Assessment & Plan: Acute decompensated systolic heart failure EF down from prior this admission to 25% cardiology consulted Continuing at this time Lasix 40 every 6 hourly as per cardiology continue lisinopril 2.5 daily Currently -2.6 L weight stable 99.5 range We will order stockings thigh-high and monitor trends of creatinine Not on beta-blocker prior to admission ?  Pneumonia- CXR 1/2 showed patchy bilateral infiltrates-White count not elevated however x-ray 174?  Infiltrates in addition to fluid Transition off Rocephin at this time to azithromycin 500 complete 2 more days of antibiotics Permanent atrial fibrillation currently in sinus rhythm on monitors on Eliquis status post pacemaker placement 10/2012 Continue Eliquis 5 mg twice daily at this time-currently is in sinus Replace magnesium aggressively given 1 g IV today as well as p.o. replacement DM TY 2 A1c this admission 7.0 No prior diagnosis requiring minimal amounts of insulin Transitioning to Amaryl low-dose discontinue sliding scale BPH Continue Flomax 0.4 twice daily Mild dementia Continue Aricept 5 mg at bedtime Gout Continue allopurinol 100 daily  On Eliquis, full code Called daughter [phone] no answer lef tVM   Await  cardiology further input  Subjective: Well coherent  Consultants:   Cardiology Antimicrobials:   Rocephin 1/3   Objective: Vitals:   12/14/19 0900 12/14/19 1125 12/14/19 2032 12/15/19 0429  BP:  113/71 134/86 118/71  Pulse:  (!) 53 (!) 102 (!) 54  Resp:  20 18 20   Temp:  (!) 97.4 F (36.3 C) 97.6 F (36.4 C) 98.6 F (37 C)  TempSrc:  Oral Oral   SpO2:  96% 100% 100%  Weight: 100.2 kg   99.5 kg  Height:        Intake/Output Summary (Last 24 hours) at 12/15/2019 02/12/2020 Last data filed at 12/15/2019 0804 Gross per 24 hour  Intake 900 ml  Output 2375 ml  Net -1475 ml   Filed Weights   12/14/19 0448 12/14/19 0900 12/15/19 0429  Weight: 100.4 kg 100.2 kg 99.5 kg    Examination: Awake alert coherent no distress eating drinking Abdomen soft nontender no rebound Chest clinically clear no added sound No rales no rhonchi however air entry significantly decreased on the right side posterolaterally both lower extremities show stasis dermatitis Neurologically intact moving all 4 limbs equally   Scheduled Meds: . allopurinol  100 mg Oral Daily  . apixaban  5 mg Oral BID  . atorvastatin  20 mg Oral QPC supper  . azithromycin  500 mg Oral Daily  . cholecalciferol  1,000 Units Oral Daily  . docusate sodium  100 mg Oral QPC supper  . donepezil  5 mg Oral QHS  . furosemide  40 mg Intravenous Q6H  . [START ON 12/16/2019] glimepiride  1 mg Oral Q breakfast  . lisinopril  2.5 mg Oral Daily  . magnesium oxide  400 mg Oral 2 times per day  . Melatonin  4.5 mg Oral QHS  .  potassium chloride SA  40 mEq Oral BID  . sodium chloride flush  3 mL Intravenous Q12H  . tamsulosin  0.4 mg Oral BID PC   Continuous Infusions: . sodium chloride    . cefTRIAXone (ROCEPHIN)  IV 2 g (12/14/19 1210)  . magnesium sulfate bolus IVPB       LOS: 4 days   Time spent: [place], MD [facility]  12/15/2019, 9:23 AM

## 2019-12-16 LAB — PROCALCITONIN: Procalcitonin: <0.1 ng/mL

## 2019-12-16 LAB — CBC WITH DIFFERENTIAL/PLATELET
Abs Immature Granulocytes: 0.02 10*3/uL (ref 0.00–0.07)
Basophils Absolute: 0 10*3/uL (ref 0.0–0.1)
Basophils Relative: 0 %
Eosinophils Absolute: 0.1 10*3/uL (ref 0.0–0.5)
Eosinophils Relative: 1 %
HCT: 37.5 % — ABNORMAL LOW (ref 39.0–52.0)
Hemoglobin: 12.3 g/dL — ABNORMAL LOW (ref 13.0–17.0)
Immature Granulocytes: 0 %
Lymphocytes Relative: 15 %
Lymphs Abs: 1.1 10*3/uL (ref 0.7–4.0)
MCH: 31.7 pg (ref 26.0–34.0)
MCHC: 32.8 g/dL (ref 30.0–36.0)
MCV: 96.6 fL (ref 80.0–100.0)
Monocytes Absolute: 0.9 10*3/uL (ref 0.1–1.0)
Monocytes Relative: 13 %
Neutro Abs: 5 10*3/uL (ref 1.7–7.7)
Neutrophils Relative %: 71 %
Platelets: 117 10*3/uL — ABNORMAL LOW (ref 150–400)
RBC: 3.88 MIL/uL — ABNORMAL LOW (ref 4.22–5.81)
RDW: 14.4 % (ref 11.5–15.5)
WBC: 7.1 10*3/uL (ref 4.0–10.5)
nRBC: 0 % (ref 0.0–0.2)

## 2019-12-16 LAB — GLUCOSE, CAPILLARY
Glucose-Capillary: 157 mg/dL — ABNORMAL HIGH (ref 70–99)
Glucose-Capillary: 187 mg/dL — ABNORMAL HIGH (ref 70–99)
Glucose-Capillary: 147 mg/dL — ABNORMAL HIGH (ref 70–99)
Glucose-Capillary: 163 mg/dL — ABNORMAL HIGH (ref 70–99)

## 2019-12-16 LAB — BASIC METABOLIC PANEL
Anion gap: 11 (ref 5–15)
BUN: 35 mg/dL — ABNORMAL HIGH (ref 8–23)
CO2: 28 mmol/L (ref 22–32)
Calcium: 8.6 mg/dL — ABNORMAL LOW (ref 8.9–10.3)
Chloride: 99 mmol/L (ref 98–111)
Creatinine, Ser: 0.94 mg/dL (ref 0.61–1.24)
GFR calc Af Amer: >60 mL/min (ref >60)
GFR calc non Af Amer: >60 mL/min (ref >60)
Glucose, Bld: 149 mg/dL — ABNORMAL HIGH (ref 70–99)
Potassium: 3.9 mmol/L (ref 3.5–5.1)
Sodium: 138 mmol/L (ref 135–145)

## 2019-12-16 MED ORDER — FUROSEMIDE 40 MG PO TABS
60.0000 mg | ORAL_TABLET | Freq: Two times a day (BID) | ORAL | Status: DC
  Administered 2019-12-16 (×2): 60 mg via ORAL
  Filled 2019-12-16 (×2): qty 1

## 2019-12-16 NOTE — Progress Notes (Signed)
Occupational Therapy Evaluation Patient Details Name: [name] MRN: [id number] DOB: 1929/03/25 Today's Date: 12/16/2019    History of Present Illness [name] is a 86 y.o. male with medical history significant of systolic dysfunction CHF, nonischemic ischemic cardiomyopathy, pacemaker placement, hypertension, diabetes, hyperlipidemia, frequent PVCs who came to the ER with progressive lower extremity edema and worsening exertional dyspnea.   Clinical Impression   PTA, pt was living at home alone, with family to assist daily for 2-3 hours at a time for IADL prn. Pt reports he was independent with ADL/IADL and functional mobility at cane level. Pt currently requires minA for LB dressing and functional mobility at cane level. Pt demonstrates decreased safety awareness, discussed fall prevention strategies and use of DME (toilet riser) to maximize safety with ADL. Pt appears to hold his breath during strenuous movements, discussed energy conservation strategies. DoE 3/4 with toilet transfer. HR ranging 100-121bpm during session. Due to decline in current level of function, pt would benefit from acute OT to address established goals to facilitate safe D/C to venue listed below. At this time, recommend HHOT follow-up. Will continue to follow acutely.     Follow Up Recommendations  Home health OT;Supervision/Assistance - 24 hour    Equipment Recommendations  3 in 1 bedside commode    Recommendations for Other Services       Precautions / Restrictions Precautions Precautions: Fall Restrictions Weight Bearing Restrictions: No      Mobility Bed Mobility Overal bed mobility: Needs Assistance Bed Mobility: Supine to Sit;Sit to Supine     Supine to sit: Min guard Sit to supine: Min guard   General bed mobility comments: minguard for safety;pt with increassed time and effort  Transfers Overall transfer level: Needs assistance Equipment used: Straight  cane Transfers: Sit to/from Stand Sit to Stand: Min assist         General transfer comment: minA for stability     Balance Overall balance assessment: Needs assistance   Sitting balance-Leahy Scale: Fair Sitting balance - Comments: pt unable to fully access feet makes it to about ankle distance, reports he needs to be in his recliner to fully access   Standing balance support: Single extremity supported Standing balance-Leahy Scale: Fair                             ADL either performed or assessed with clinical judgement   ADL Overall ADL's : Needs assistance/impaired Eating/Feeding: Set up;Sitting   Grooming: Min guard;Standing   Upper Body Bathing: Min guard;Sitting   Lower Body Bathing: Minimal assistance;Sit to/from stand   Upper Body Dressing : Min guard;Sitting   Lower Body Dressing: Minimal assistance;Sit to/from stand Lower Body Dressing Details (indicate cue type and reason): pt reports he "needs to be in his recliner" to don/doff socks;demonstrated limited ROM unable to fully access feet;minA for stability with powerup pt with increased time and extreme forward flexion into standing Toilet Transfer: Min guard;Ambulation;Minimal assistance Toilet Transfer Details (indicate cue type and reason): ambulated in room to [place] and Hygiene: Minimal assistance;Sit to/from stand       Functional mobility during ADLs: Min guard;Minimal assistance;Rolling walker General ADL Comments: pt at an increased risk for falls, decreased safety awareness, decreased stability     Vision Patient Visual Report: No change from baseline       Perception     Praxis      Pertinent Vitals/Pain Pain Assessment:  No/denies pain     Hand Dominance Right   Extremity/Trunk Assessment Upper Extremity Assessment Upper Extremity Assessment: Overall WFL for tasks assessed   Lower Extremity Assessment Lower Extremity Assessment: Overall WFL  for tasks assessed   Cervical / Trunk Assessment Cervical / Trunk Assessment: Kyphotic   Communication Communication Communication: HOH   Cognition Arousal/Alertness: Awake/alert Behavior During Therapy: WFL for tasks assessed/performed Overall Cognitive Status: Within Functional Limits for tasks assessed                                 General Comments: decreased awareness of safety;reports using a "toilet paper holder" for support to powerup into standing at home;discussed safety awareness and fall risks, pt has toilet seat riser, educated pt on fall prevention   General Comments  discussed fall prevention and energy conservation strategies with pt and family member;pt holds his breath frequently during strenuous movements;HR ranging 100-121 during session    Exercises     Shoulder Instructions      Home Living Family/patient expects to be discharged to:: Private residence Living Arrangements: Alone Available Help at Discharge: Family;Available PRN/intermittently Type of Home: House Home Access: Stairs to enter [employer] of Steps: 6 Entrance Stairs-Rails: Right;Left Home Layout: One level     Bathroom Shower/Tub: Chief Strategy Officer: Standard     Home Equipment: Environmental consultant - 2 wheels;Cane - single point;Bedside commode   Additional Comments: sleeps in recliner;family able to assist daily for 2-3 hours at a time      Prior Functioning/Environment Level of Independence: Independent with assistive device(s)        Comments: Amb with cane        OT Problem List: Decreased activity tolerance;Impaired balance (sitting and/or standing);Decreased safety awareness;Decreased knowledge of use of DME or AE;Decreased knowledge of precautions;Cardiopulmonary status limiting activity      OT Treatment/Interventions: Self-care/ADL training;Therapeutic exercise;Energy conservation;DME and/or AE instruction;Therapeutic  activities;Patient/family education;Balance training    OT Goals(Current goals can be found in the care plan section) Acute Rehab OT Goals Patient Stated Goal: to go home OT Goal Formulation: With patient Time For Goal Achievement: 12/30/19 Potential to Achieve Goals: Good ADL Goals Pt Will Perform Grooming: with modified independence;sitting;standing Pt Will Perform Lower Body Dressing: with modified independence;sit to/from stand Pt Will Transfer to Toilet: with modified independence;ambulating Additional ADL Goal #1: Pt will demonstrate independence with 3 energy conservation strategies during ADL and functional mobility.  OT Frequency: Min 2X/week   Barriers to D/C: Decreased caregiver support  pt lives alone family available PRN       Co-evaluation              AM-PAC OT "6 Clicks" Daily Activity     Outcome Measure Help from another person eating meals?: A Little Help from another person taking care of personal grooming?: A Little Help from another person toileting, which includes using toliet, bedpan, or urinal?: A Little Help from another person bathing (including washing, rinsing, drying)?: A Little Help from another person to put on and taking off regular upper body clothing?: A Little Help from another person to put on and taking off regular lower body clothing?: A Little 6 Click Score: 18   End of Session Equipment Utilized During Treatment: Gait belt(spc) Nurse Communication: Mobility status(pt reporting nausea)  Activity Tolerance: Patient tolerated treatment well Patient left: in bed;with call bell/phone within reach;with nursing/sitter in room;with family/visitor present  OT  Visit Diagnosis: Unsteadiness on feet (R26.81);Other abnormalities of gait and mobility (R26.89);Muscle weakness (generalized) (M62.81);Other symptoms and signs involving cognitive function                Time: 8338-2505 OT Time Calculation (min): 23 min Charges:  OT General  Charges $OT Visit: 1 Visit OT Evaluation $OT Eval Moderate Complexity: 1 Mod OT Treatments $Self Care/Home Management : 8-22 mins  Diona Browner OTR/L Acute Rehabilitation Services Office: [phone]   Rebeca Alert 12/16/2019, 4:26 PM

## 2019-12-16 NOTE — Plan of Care (Signed)
  Problem: Clinical Measurements: Goal: Respiratory complications will improve Outcome: Progressing   Problem: Elimination: Goal: Will not experience complications related to bowel motility Outcome: Progressing   Problem: Safety: Goal: Ability to remain free from injury will improve Outcome: Progressing   

## 2019-12-16 NOTE — Progress Notes (Signed)
PROGRESS NOTE    [employer]  JME:[id number] DOB: Mar 16, 1929 DOA: 12/11/2019 PCP: Wynn Banker, MD  Brief Narrative: 85 year old male with history of nonischemic cardiomyopathy, EF of 30%, chronic left bundle branch block, ICD, chronic atrial fibrillation previously on Coumadin, hypertension, dyslipidemia, BPH was admitted with acute on chronic diastolic CHF on 1/1. -Cardiology following, improving with diuresis   Assessment & Plan:   Acute on chronic systolic CHF/BiV ICD -Echo with EF of 30 to 35%, severe dilated LA/RA, mild to moderate MR, aortic valve sclerosis -Continue IV Lasix, cardiology following -He is 3 L negative since admission, weight appears inaccurate -Wean O2 as tolerated -Ambulate, PT eval -Continue carvedilol and low-dose lisinopril  Permanent atrial fibrillation -Continue Eliquis, low-dose carvedilol added  ?  Pneumonia -Suspect symptoms and admission is secondary to CHF only and not pneumonia -Completed 5 days of antibiotics, does not need any more  Type 2 diabetes mellitus -Stable, hemoglobin A1c is 7.0 -Discontinue glimepiride, would not advocate therapy given advanced age, risk of hypoglycemia  BPH -Continue Flomax  Mild dementia -Continue Aricept  Gout -Continue allopurinol  DVT prophylaxis: Eliquis Code Status: Full code Family Communication: No family at bedside Disposition Plan: Home in 1 to 2 days pending adequate diuresis  Consultants:   Cardiology   Procedures:   Antimicrobials:    Subjective: -Breathing better, but feels weaker than usual,  Objective: Vitals:   12/15/19 1750 12/15/19 2041 12/16/19 0439 12/16/19 0907  BP: 119/62 107/64 104/72 120/71  Pulse: 93 72 (!) 53 99  Resp: 20 20 20    Temp: 97.6 F (36.4 C) 97.6 F (36.4 C) (!) 97.4 F (36.3 C)   TempSrc: Oral Oral Oral   SpO2: 97% 96% 99% 96%  Weight:   101 kg   Height:        Intake/Output Summary (Last 24 hours) at 12/16/2019 1200 Last  data filed at 12/16/2019 0900 Gross per 24 hour  Intake 960 ml  Output 450 ml  Net 510 ml   Filed Weights   12/14/19 0900 12/15/19 0429 12/16/19 0439  Weight: 100.2 kg 99.5 kg 101 kg    Examination:  Gen: Elderly male sitting up in bed, AAOx3, no distress HEENT: PERRLA, Neck supple, no JVD Lungs: Few basilar rales  CVS: RRR,No Gallops,Rubs or new Murmurs Abd: soft, Non tender, non distended, BS present Extremities: Trace Skin: no new rashes Psychiatry:Mood & affect appropriate.     Data Reviewed:   CBC: Recent Labs  Lab 12/11/19 1245 12/12/19 0415 12/15/19 0453 12/16/19 0431  WBC 8.5 7.3 7.2 7.1  NEUTROABS  --  4.8 5.1 5.0  HGB 12.6* 11.8* 12.8* 12.3*  HCT 38.1* 35.7* 37.6* 37.5*  MCV 96.2 96.0 94.0 96.6  PLT 136* 122* 108* 117*   Basic Metabolic Panel: Recent Labs  Lab 12/12/19 0415 12/13/19 0850 12/14/19 0708 12/15/19 0453 12/16/19 0431  NA 141 138 139 137 138  K 3.5 3.6 3.3* 3.8 3.9  CL 105 102 99 96* 99  CO2 25 27 29 28 28   GLUCOSE 151* 173* 150* 141* 149*  BUN 32* 32* 34* 33* 35*  CREATININE 1.08 1.13 1.21 1.01 0.94  CALCIUM 8.9 8.4* 8.4* 8.6* 8.6*  MG  --  1.6*  --  1.7  --    GFR: Estimated Creatinine Clearance: 62.5 mL/min (by C-G formula based on SCr of 0.94 mg/dL). Liver Function Tests: Recent Labs  Lab 12/11/19 1338  AST 28  ALT 31  ALKPHOS 83  BILITOT 1.3*  PROT  6.4*  ALBUMIN 3.4*   No results for input(s): LIPASE, AMYLASE in the last 168 hours. No results for input(s): AMMONIA in the last 168 hours. Coagulation Profile: No results for input(s): INR, PROTIME in the last 168 hours. Cardiac Enzymes: No results for input(s): CKTOTAL, CKMB, CKMBINDEX, TROPONINI in the last 168 hours. BNP (last 3 results) No results for input(s): PROBNP in the last 8760 hours. HbA1C: No results for input(s): HGBA1C in the last 72 hours. CBG: Recent Labs  Lab 12/15/19 1135 12/15/19 1612 12/15/19 2150 12/16/19 0633 12/16/19 1140  GLUCAP 173*  137* 170* 157* 187*   Lipid Profile: No results for input(s): CHOL, HDL, LDLCALC, TRIG, CHOLHDL, LDLDIRECT in the last 72 hours. Thyroid Function Tests: No results for input(s): TSH, T4TOTAL, FREET4, T3FREE, THYROIDAB in the last 72 hours. Anemia Panel: No results for input(s): VITAMINB12, FOLATE, FERRITIN, TIBC, IRON, RETICCTPCT in the last 72 hours. Urine analysis:    Component Value Date/Time   COLORURINE YELLOW 09/26/2015 1424   APPEARANCEUR CLEAR 09/26/2015 1424   LABSPEC 1.015 09/26/2015 1424   PHURINE 5.5 09/26/2015 1424   GLUCOSEU NEGATIVE 09/26/2015 1424   HGBUR NEGATIVE 09/26/2015 1424   BILIRUBINUR NEGATIVE 09/26/2015 1424   KETONESUR NEGATIVE 09/26/2015 1424   PROTEINUR NEGATIVE 02/08/2015 0900   UROBILINOGEN 0.2 09/26/2015 1424   NITRITE NEGATIVE 09/26/2015 1424   LEUKOCYTESUR NEGATIVE 09/26/2015 1424   Sepsis Labs: @LABRCNTIP (procalcitonin:4,lacticidven:4)  ) Recent Results (from the past 240 hour(s))  SARS CORONAVIRUS 2 (TAT 6-24 HRS) Nasopharyngeal Nasopharyngeal Swab     Status: None   Collection Time: 12/11/19  6:25 PM   Specimen: Nasopharyngeal Swab  Result Value Ref Range Status   SARS Coronavirus 2 NEGATIVE NEGATIVE Final    Comment: (NOTE) SARS-CoV-2 target nucleic acids are NOT DETECTED. The SARS-CoV-2 RNA is generally detectable in upper and lower respiratory specimens during the acute phase of infection. Negative results do not preclude SARS-CoV-2 infection, do not rule out co-infections with other pathogens, and should not be used as the sole basis for treatment or other patient management decisions. Negative results must be combined with clinical observations, patient history, and epidemiological information. The expected result is Negative. Fact Sheet for Patients: SugarRoll.be Fact Sheet for Healthcare Providers: https://www.woods-mathews.com/ This test is not yet approved or cleared by the [place] FDA and  has been authorized for detection and/or diagnosis of SARS-CoV-2 by FDA under an Emergency Use Authorization (EUA). This EUA will remain  in effect (meaning this test can be used) for the duration of the COVID-19 declaration under Section 56 4(b)(1) of the Act, 21 U.S.C. section 360bbb-3(b)(1), unless the authorization is terminated or revoked sooner. Performed at [facility], [place] [address] [place], [place] [address]          Radiology Studies: DG Chest 2 View  Result Date: 12/14/2019 CLINICAL DATA:  Shortness of breath. Pneumonia. EXAM: CHEST - 2 VIEW COMPARISON:  One-view chest x-ray 12/12/2019. FINDINGS: The heart is enlarged. Pacing and defibrillator wires are stable. Right greater than left airspace disease is similar the prior study. Bilateral effusions are again noted, right greater than left. IMPRESSION: 1. Stable appearance of right greater than left airspace disease compatible with multifocal pneumonia. 2. Stable bilateral pleural effusions, right greater than left. Electronically Signed   By: [place] M.D.   On: 12/14/2019 14:17        Scheduled Meds: . allopurinol  100 mg Oral Daily  . apixaban  5 mg Oral BID  .  atorvastatin  20 mg Oral QPC supper  . carvedilol  3.125 mg Oral BID WC  . cholecalciferol  1,000 Units Oral Daily  . docusate sodium  100 mg Oral QPC supper  . donepezil  5 mg Oral QHS  . furosemide  60 mg Oral BID  . glimepiride  1 mg Oral Q breakfast  . lisinopril  2.5 mg Oral Daily  . magnesium oxide  400 mg Oral 2 times per day  . Melatonin  4.5 mg Oral QHS  . potassium chloride SA  40 mEq Oral BID  . sodium chloride flush  3 mL Intravenous Q12H  . tamsulosin  0.4 mg Oral BID PC   Continuous Infusions: . sodium chloride       LOS: 5 days    Time spent:  Zannie [place], MD [facility]  12/16/2019, 12:00 PM

## 2019-12-16 NOTE — Evaluation (Signed)
Physical Therapy Evaluation Patient Details Name: [name] MRN: [id number] DOB: Nov 29, 1929 Today's Date: 12/16/2019   History of Present Illness  Clements Council Pernell is a 86 y.o. male with medical history significant of systolic dysfunction CHF, nonischemic ischemic cardiomyopathy, pacemaker placement, hypertension, diabetes, hyperlipidemia, frequent PVCs who came to the ER with progressive lower extremity edema and worsening exertional dyspnea.  Clinical Impression  Pt admitted with above diagnosis.  Pt currently with functional limitations due to the deficits listed below (see PT Problem List). Pt will benefit from skilled PT to increase their independence and safety with mobility to allow discharge to the venue listed below.  Pt o2 97% on RA after gait with cane and 2/4 dyspnea. Pt unable to don socks sitting at EOB during eval and recommended acute OT eval.  Recommend HHPT for home safety assessment.    Follow Up Recommendations Home health PT;Supervision - Intermittent    Equipment Recommendations  None recommended by PT    Recommendations for Other Services OT consult     Precautions / Restrictions Precautions Precautions: Fall Restrictions Weight Bearing Restrictions: No      Mobility  Bed Mobility               General bed mobility comments: Pt sitting EOB with nursing upon arrival  Transfers Overall transfer level: Needs assistance Equipment used: Straight cane Transfers: Sit to/from Stand Sit to Stand: Min guard         General transfer comment: min/guard for safety  Ambulation/Gait Ambulation/Gait assistance: Min guard Gait Distance (Feet): 100 Feet Assistive device: Straight cane Gait Pattern/deviations: Step-through pattern;Decreased step length - right;Decreased step length - left;Wide base of support Gait velocity: decreased   General Gait Details: In room pt has a tendency to reach out with one hand for external support while  using cane with the other.  In hallway, he was able to ambulate with just cane and min/guard. 2/4 dyspnea on room air o2 97%  Stairs            Wheelchair Mobility    Modified Rankin (Stroke Patients Only)       Balance Overall balance assessment: Needs assistance   Sitting balance-Leahy Scale: Fair Sitting balance - Comments: Pt unable to don socks with attempts and asked PT to put them on. He states "at home I'll be able to."   Standing balance support: Single extremity supported Standing balance-Leahy Scale: Fair                               Pertinent Vitals/Pain Pain Assessment: No/denies pain    Home Living Family/patient expects to be discharged to:: Private residence Living Arrangements: Alone Available Help at Discharge: Family;Available PRN/intermittently Type of Home: House Home Access: Stairs to enter Entrance Stairs-Rails: Psychiatric nurse of Steps: 6 Home Layout: One level Home Equipment: Walker - 2 wheels;Cane - single point;Bedside commode Additional Comments: sleeps in recliner    Prior Function Level of Independence: Independent with assistive device(s)         Comments: Amb with cane     Hand Dominance        Extremity/Trunk Assessment   Upper Extremity Assessment Upper Extremity Assessment: Overall WFL for tasks assessed    Lower Extremity Assessment Lower Extremity Assessment: Overall WFL for tasks assessed       Communication   Communication: HOH  Cognition Arousal/Alertness: Awake/alert Behavior During Therapy: WFL for tasks assessed/performed Overall  Cognitive Status: Within Functional Limits for tasks assessed                                        General Comments      Exercises     Assessment/Plan    PT Assessment Patient needs continued PT services  PT Problem List Decreased balance;Decreased mobility;Decreased activity tolerance;Decreased safety awareness        PT Treatment Interventions DME instruction;Gait training;Functional mobility training;Stair training;Therapeutic exercise;Therapeutic activities;Patient/family education    PT Goals (Current goals can be found in the Care Plan section)  Acute Rehab PT Goals Patient Stated Goal: go home PT Goal Formulation: With patient Time For Goal Achievement: 12/30/19 Potential to Achieve Goals: Good    Frequency Min 3X/week   Barriers to discharge        Co-evaluation               AM-PAC PT "6 Clicks" Mobility  Outcome Measure Help needed turning from your back to your side while in a flat bed without using bedrails?: A Little Help needed moving from lying on your back to sitting on the side of a flat bed without using bedrails?: A Little Help needed moving to and from a bed to a chair (including a wheelchair)?: A Little Help needed standing up from a chair using your arms (e.g., wheelchair or bedside chair)?: A Little Help needed to walk in hospital room?: A Little Help needed climbing 3-5 steps with a railing? : A Little 6 Click Score: 18    End of Session Equipment Utilized During Treatment: Gait belt Activity Tolerance: Patient tolerated treatment well Patient left: in chair;with call bell/phone within reach Nurse Communication: Mobility status PT Visit Diagnosis: Unsteadiness on feet (R26.81);Difficulty in walking, not elsewhere classified (R26.2)    Time: 2426-8341 PT Time Calculation (min) (ACUTE ONLY): 28 min   Charges:   PT Evaluation $PT Eval Moderate Complexity: 1 Mod PT Treatments $Gait Training: 8-22 mins        Oluwatobi Ruppe L. Katrinka Blazing, [place] Pager [phone] 12/16/2019   Enzo Montgomery 12/16/2019, 11:58 AM

## 2019-12-16 NOTE — Plan of Care (Signed)
  Problem: Clinical Measurements: Goal: Respiratory complications will improve Outcome: Progressing   Problem: Activity: Goal: Risk for activity intolerance will decrease Outcome: Progressing   Problem: Elimination: Goal: Will not experience complications related to bowel motility Outcome: Progressing   Problem: Safety: Goal: Ability to remain free from injury will improve Outcome: Progressing   

## 2019-12-16 NOTE — Care Management Important Message (Signed)
Important Message  Patient Details  Name: [name] MRN: [id number] Date of Birth: 1929-07-31   Medicare Important Message Given:  Yes     Renie Ora 12/16/2019, 12:32 PM

## 2019-12-16 NOTE — Progress Notes (Addendum)
Progress Note  Patient Name: [name] Date of Encounter: 12/16/2019  Primary Cardiologist: Olga Millers, MD   Subjective   Patient feels weak today. He thinks it is from the lasix. No chest pain. Still with 2L O2.   Inpatient Medications    Scheduled Meds: . allopurinol  100 mg Oral Daily  . apixaban  5 mg Oral BID  . atorvastatin  20 mg Oral QPC supper  . azithromycin  500 mg Oral Daily  . carvedilol  3.125 mg Oral BID WC  . cholecalciferol  1,000 Units Oral Daily  . docusate sodium  100 mg Oral QPC supper  . donepezil  5 mg Oral QHS  . furosemide  60 mg Intravenous BID  . glimepiride  1 mg Oral Q breakfast  . lisinopril  2.5 mg Oral Daily  . magnesium oxide  400 mg Oral 2 times per day  . Melatonin  4.5 mg Oral QHS  . potassium chloride SA  40 mEq Oral BID  . sodium chloride flush  3 mL Intravenous Q12H  . tamsulosin  0.4 mg Oral BID PC   Continuous Infusions: . sodium chloride     PRN Meds: sodium chloride, acetaminophen, albuterol, ondansetron (ZOFRAN) IV, sodium chloride flush   Vital Signs    Vitals:   12/15/19 1135 12/15/19 1750 12/15/19 2041 12/16/19 0439  BP: 112/68 119/62 107/64 104/72  Pulse: (!) 107 93 72 (!) 53  Resp: 18 20 20 20   Temp: 97.9 F (36.6 C) 97.6 F (36.4 C) 97.6 F (36.4 C) (!) 97.4 F (36.3 C)  TempSrc: Oral Oral Oral Oral  SpO2: 98% 97% 96% 99%  Weight:    101 kg  Height:        Intake/Output Summary (Last 24 hours) at 12/16/2019 0710 Last data filed at 12/16/2019 0200 Gross per 24 hour  Intake 1140 ml  Output 1250 ml  Net -110 ml   Last 3 Weights 12/16/2019 12/15/2019 12/14/2019  Weight (lbs) 222 lb 10.6 oz 219 lb 6.4 oz 220 lb 12.8 oz  Weight (kg) 101 kg 99.519 kg 100.154 kg      Telemetry    Underlying Afib rhythm with intermittent V-pacing - Personally Reviewed  ECG    No new - Personally Reviewed  Physical Exam   GEN: No acute distress.   Neck: No JVD Cardiac: RRR, no murmurs, rubs, or gallops.   Respiratory: Wheezing, crackles at right base. GI: Soft, nontender, non-distended  MS: Trace edema; No deformity. Neuro:  Nonfocal  Psych: Normal affect   Labs    High Sensitivity Troponin:   Recent Labs  Lab 12/11/19 1338 12/11/19 1638  TROPONINIHS 41* 42*      Chemistry Recent Labs  Lab 12/11/19 1338 12/14/19 0708 12/15/19 0453 12/16/19 0431  NA  --  139 137 138  K  --  3.3* 3.8 3.9  CL  --  99 96* 99  CO2  --  29 28 28   GLUCOSE  --  150* 141* 149*  BUN  --  34* 33* 35*  CREATININE  --  1.21 1.01 0.94  CALCIUM  --  8.4* 8.6* 8.6*  PROT 6.4*  --   --   --   ALBUMIN 3.4*  --   --   --   AST 28  --   --   --   ALT 31  --   --   --   ALKPHOS 83  --   --   --  BILITOT 1.3*  --   --   --   GFRNONAA  --  53* >60 >60  GFRAA  --  >60 >60 >60  ANIONGAP  --  11 13 11      Hematology Recent Labs  Lab 12/12/19 0415 12/15/19 0453 12/16/19 0431  WBC 7.3 7.2 7.1  RBC 3.72* 4.00* 3.88*  HGB 11.8* 12.8* 12.3*  HCT 35.7* 37.6* 37.5*  MCV 96.0 94.0 96.6  MCH 31.7 32.0 31.7  MCHC 33.1 34.0 32.8  RDW 14.5 14.1 14.4  PLT 122* 108* 117*    BNP Recent Labs  Lab 12/11/19 1638  BNP 1,035.4*     DDimer No results for input(s): DDIMER in the last 168 hours.   Radiology    DG Chest 2 View  Result Date: 12/14/2019 CLINICAL DATA:  Shortness of breath. Pneumonia. EXAM: CHEST - 2 VIEW COMPARISON:  One-view chest x-ray 12/12/2019. FINDINGS: The heart is enlarged. Pacing and defibrillator wires are stable. Right greater than left airspace disease is similar the prior study. Bilateral effusions are again noted, right greater than left. IMPRESSION: 1. Stable appearance of right greater than left airspace disease compatible with multifocal pneumonia. 2. Stable bilateral pleural effusions, right greater than left. Electronically Signed   By: San Morelle M.D.   On: 12/14/2019 14:17    Cardiac Studies   Echo 12/12/19  1. Left ventricular ejection fraction, by visual  estimation, is 30 to 35%. The left ventricle has moderate to severely decreased function. There is no left ventricular hypertrophy.  2. Definity contrast agent was given IV to delineate the left ventricular endocardial borders.  3. Left ventricular diastolic function could not be evaluated.  4. The left ventricle demonstrates global hypokinesis.  5. Global right ventricle has normal systolic function.The right ventricular size is normal. No increase in right ventricular wall thickness.  6. Left atrial size was severely dilated.  7. Right atrial size was severely dilated.  8. The mitral valve is normal in structure. Mild to moderate mitral valve regurgitation. No evidence of mitral stenosis.  9. The tricuspid valve is normal in structure. 10. The aortic valve is normal in structure. Aortic valve regurgitation is not visualized. Mild to moderate aortic valve sclerosis/calcification without any evidence of aortic stenosis. 11. The pulmonic valve was normal in structure. Pulmonic valve regurgitation is trivial. 12. Aortic dilatation noted. 13. There is mild dilatation of the ascending aorta measuring 42 mm. 14. Compared with the echo 02/2018, the ascending aorta has increased from 3.8 cm to 4.2 cm. 15. Moderately elevated pulmonary artery systolic pressure. 16. A pacer wire is visualized. 17. The inferior vena cava is normal in size with greater than 50% respiratory variability, suggesting right atrial pressure of 3 mmHg.  Patient Profile     86 y.o. male with pmh of normal coronaries on cardiac cath in 7824, chronic systolic CHF, non-ischemic cardiomyopathy, s/p BV ICD placement in 2013 which was downgraded to BiV PPM 07/2016, permanent Afib on Eliquis, HTN, Hyperlipidemia, DM2, and BPH who is being seen for acute CHF.   Assessment & Plan    Acute on Chronic combined systolic/diastolic CHF - Echo this admission showed new low EF 30-35%, global hypokinesis, severely dilated LA and RA, mild to mod  MR, aortic valve sclerosis, aortic dilation, mild dilation of ascending Arota - IV lasix increased to 60 mg BID - Patient put out 1.2 L in the last 24 hours and is -3L since admission - Weight does not appear accurate today>> is up  3 lbs since yesterday but still down 2 lbs since admission - Patient is still on 2L O2>>try to wean - creatinine 1.01 > 0.94 - With trace edema on exam consider switching to oral lasix  NICM - Continue with Lisinopril 2.5 mg daily - Carvedilol addedd - Meds cannot be titrated due to h/o of orthostasis  Permanent Afib - Continue Eliquis - K+ 3.9, Mag 1.7 - Carvedilol added  PNA - ABX per IM  S/p Biventricular ICD - stable per last check 12/02/19  HLD - continue statin  For questions or updates, please contact [facility] Please consult [email or web address] for contact info under   Signed, Cadence H Furth, PA-C  12/16/2019, 7:10 AM   As above, patient seen and examined.  Volume status is improving.  He feels as though IV Lasix is making him weak.  I will discontinue IV Lasix and begin 60 mg by mouth twice daily.  He remains weak requiring assistance with ambulation.  Ambulate today.  Possible discharge tomorrow morning if stable.  Cannot advance ACE inhibitor or beta-blocker at this point as blood pressure is borderline.  Will consider as an outpatient. Olga Millers, MD

## 2019-12-17 ENCOUNTER — Other Ambulatory Visit: Payer: Self-pay | Admitting: Medical

## 2019-12-17 ENCOUNTER — Telehealth: Payer: Self-pay | Admitting: Cardiology

## 2019-12-17 DIAGNOSIS — I5041 Acute combined systolic (congestive) and diastolic (congestive) heart failure: Secondary | ICD-10-CM

## 2019-12-17 LAB — GLUCOSE, CAPILLARY
Glucose-Capillary: 155 mg/dL — ABNORMAL HIGH (ref 70–99)
Glucose-Capillary: 151 mg/dL — ABNORMAL HIGH (ref 70–99)
Glucose-Capillary: 148 mg/dL — ABNORMAL HIGH (ref 70–99)
Glucose-Capillary: 162 mg/dL — ABNORMAL HIGH (ref 70–99)

## 2019-12-17 LAB — BASIC METABOLIC PANEL
Anion gap: 8 (ref 5–15)
BUN: 45 mg/dL — ABNORMAL HIGH (ref 8–23)
CO2: 29 mmol/L (ref 22–32)
Calcium: 8.8 mg/dL — ABNORMAL LOW (ref 8.9–10.3)
Chloride: 99 mmol/L (ref 98–111)
Creatinine, Ser: 1.3 mg/dL — ABNORMAL HIGH (ref 0.61–1.24)
GFR calc Af Amer: 56 mL/min — ABNORMAL LOW (ref >60)
GFR calc non Af Amer: 49 mL/min — ABNORMAL LOW (ref >60)
Glucose, Bld: 155 mg/dL — ABNORMAL HIGH (ref 70–99)
Potassium: 5.3 mmol/L — ABNORMAL HIGH (ref 3.5–5.1)
Sodium: 136 mmol/L (ref 135–145)

## 2019-12-17 LAB — CBC
HCT: 39.3 % (ref 39.0–52.0)
Hemoglobin: 13.2 g/dL (ref 13.0–17.0)
MCH: 31.6 pg (ref 26.0–34.0)
MCHC: 33.6 g/dL (ref 30.0–36.0)
MCV: 94 fL (ref 80.0–100.0)
Platelets: 126 10*3/uL — ABNORMAL LOW (ref 150–400)
RBC: 4.18 MIL/uL — ABNORMAL LOW (ref 4.22–5.81)
RDW: 14.1 % (ref 11.5–15.5)
WBC: 7.4 10*3/uL (ref 4.0–10.5)
nRBC: 0 % (ref 0.0–0.2)

## 2019-12-17 MED ORDER — ALPRAZOLAM 0.25 MG PO TABS
0.2500 mg | ORAL_TABLET | Freq: Two times a day (BID) | ORAL | Status: DC | PRN
  Administered 2019-12-17: 0.25 mg via ORAL
  Filled 2019-12-17: qty 1

## 2019-12-17 MED ORDER — FUROSEMIDE 40 MG PO TABS: 60.0000 mg | ORAL_TABLET | Freq: Two times a day (BID) | ORAL | Status: DC

## 2019-12-17 NOTE — Progress Notes (Signed)
Progress Note  Patient Name: [facility] Date of Encounter: 12/17/2019  Primary Cardiologist: Olga Millers, MD   Subjective   Denies dyspnea or CP  Inpatient Medications    Scheduled Meds: . allopurinol  100 mg Oral Daily  . apixaban  5 mg Oral BID  . atorvastatin  20 mg Oral QPC supper  . carvedilol  3.125 mg Oral BID WC  . cholecalciferol  1,000 Units Oral Daily  . docusate sodium  100 mg Oral QPC supper  . donepezil  5 mg Oral QHS  . furosemide  60 mg Oral BID  . magnesium oxide  400 mg Oral 2 times per day  . Melatonin  4.5 mg Oral QHS  . sodium chloride flush  3 mL Intravenous Q12H  . tamsulosin  0.4 mg Oral BID PC   Continuous Infusions: . sodium chloride     PRN Meds: sodium chloride, acetaminophen, albuterol, ondansetron (ZOFRAN) IV, sodium chloride flush   Vital Signs    Vitals:   12/16/19 1233 12/16/19 1629 12/16/19 2100 12/17/19 0456  BP: 115/74 109/69 106/69 123/74  Pulse: (!) 55 (!) 116 (!) 53 (!) 51  Resp: 18 20 19 20   Temp: 98 F (36.7 C)  97.7 F (36.5 C) 97.7 F (36.5 C)  TempSrc: Oral  Oral Oral  SpO2: 98% 97% 97% 94%  Weight:    101.2 kg  Height:        Intake/Output Summary (Last 24 hours) at 12/17/2019 0857 Last data filed at 12/17/2019 0700 Gross per 24 hour  Intake 1320 ml  Output 2701 ml  Net -1381 ml   Last 3 Weights 12/17/2019 12/16/2019 12/15/2019  Weight (lbs) 223 lb 1.7 oz 222 lb 10.6 oz 219 lb 6.4 oz  Weight (kg) 101.2 kg 101 kg 99.519 kg      Telemetry    Underlying Afib with intermittent V-pacing - Personally Reviewed  Physical Exam   GEN: No acute distress.  WD Neck: supple Cardiac: irregular Respiratory: Diminished BS throughout GI: Soft, NT/ND MS: Trace edema; chronic skin changes Neuro:  Grossly intact Psych: Normal affect   Labs    High Sensitivity Troponin:   Recent Labs  Lab 12/11/19 1338 12/11/19 1638  TROPONINIHS 41* 42*      Chemistry Recent Labs  Lab 12/11/19 1338  12/15/19 0453 12/16/19 0431 12/17/19 0505  NA  --  137 138 136  K  --  3.8 3.9 5.3*  CL  --  96* 99 99  CO2  --  28 28 29   GLUCOSE  --  141* 149* 155*  BUN  --  33* 35* 45*  CREATININE  --  1.01 0.94 1.30*  CALCIUM  --  8.6* 8.6* 8.8*  PROT 6.4*  --   --   --   ALBUMIN 3.4*  --   --   --   AST 28  --   --   --   ALT 31  --   --   --   ALKPHOS 83  --   --   --   BILITOT 1.3*  --   --   --   GFRNONAA  --  >60 >60 49*  GFRAA  --  >60 >60 56*  ANIONGAP  --  13 11 8      Hematology Recent Labs  Lab 12/15/19 0453 12/16/19 0431 12/17/19 0505  WBC 7.2 7.1 7.4  RBC 4.00* 3.88* 4.18*  HGB 12.8* 12.3* 13.2  HCT 37.6* 37.5* 39.3  MCV 94.0 96.6 94.0  MCH 32.0 31.7 31.6  MCHC 34.0 32.8 33.6  RDW 14.1 14.4 14.1  PLT 108* 117* 126*    BNP Recent Labs  Lab 12/11/19 1638  BNP 1,035.4*     Cardiac Studies   Echo 12/12/19  1. Left ventricular ejection fraction, by visual estimation, is 30 to 35%. The left ventricle has moderate to severely decreased function. There is no left ventricular hypertrophy.  2. Definity contrast agent was given IV to delineate the left ventricular endocardial borders.  3. Left ventricular diastolic function could not be evaluated.  4. The left ventricle demonstrates global hypokinesis.  5. Global right ventricle has normal systolic function.The right ventricular size is normal. No increase in right ventricular wall thickness.  6. Left atrial size was severely dilated.  7. Right atrial size was severely dilated.  8. The mitral valve is normal in structure. Mild to moderate mitral valve regurgitation. No evidence of mitral stenosis.  9. The tricuspid valve is normal in structure. 10. The aortic valve is normal in structure. Aortic valve regurgitation is not visualized. Mild to moderate aortic valve sclerosis/calcification without any evidence of aortic stenosis. 11. The pulmonic valve was normal in structure. Pulmonic valve regurgitation is trivial. 12.  Aortic dilatation noted. 13. There is mild dilatation of the ascending aorta measuring 42 mm. 14. Compared with the echo 02/2018, the ascending aorta has increased from 3.8 cm to 4.2 cm. 15. Moderately elevated pulmonary artery systolic pressure. 16. A pacer wire is visualized. 17. The inferior vena cava is normal in size with greater than 50% respiratory variability, suggesting right atrial pressure of 3 mmHg.  Patient Profile     86 y.o. male with pmh of normal coronaries on cardiac cath in 0932, chronic systolic CHF, non-ischemic cardiomyopathy, s/p BV ICD placement in 2013 which was downgraded to BiV PPM 07/2016, permanent Afib on Eliquis, HTN, Hyperlipidemia, DM2, and BPH who is being seen for acute CHF.   Assessment & Plan    1 Acute on chronic combined systolic/diastolic congestive heart failure-I/O-1381; Wt 101.2 kg.  BUN and creatinine increased this morning.  We will hold Lasix today and tomorrow.  Resume Saturday 80 mg daily with additional 40 mg for weight gain of 2 to 3 pounds.  Check potassium and renal function on Monday.  2 nonischemic cardiomyopathy-continue low-dose lisinopril (resume in AM) and carvedilol.  I have not advanced medications due to history of orthostasis.  We will not treat with Entresto for the same reason.  3 permanent atrial fibrillation-continue carvedilol and apixaban.  4 history of biventricular ICD  Patient can be discharged from a cardiac standpoint.  We will arrange transition of care appointment in 1 week.  Plan bmet on Monday.  [facility] will sign off.   Medication Recommendations: Continue present medications at discharge.  No Lasix today or tomorrow and resume 80 mg daily on Saturday.  Take additional 40 mg daily for weight gain of 2 to 3 pounds.  Resume lisinopril 2.5 mg daily beginning tomorrow. Other recommendations (labs, testing, etc):  Check BMET 1-11. Follow up as an outpatient:  TOC appt with APP one week.  For questions or updates,  please contact [facility] Please consult [email or web address] for contact info under   Signed, Kirk Ruths, MD  12/17/2019, 8:57 AM

## 2019-12-17 NOTE — Telephone Encounter (Signed)
Patient has TOC appointment with Edd Fabian on 1/13 at 10:15 per Cadence.

## 2019-12-17 NOTE — Telephone Encounter (Signed)
Still admitted-Still admitted-12/11/2019 - present (6 days) [facility]

## 2019-12-17 NOTE — Progress Notes (Signed)
Occupational Therapy Treatment Patient Details Name: [name] MRN: [id number] DOB: Apr 18, 1929 Today's Date: 12/17/2019    History of present illness Gaberial Council Sheerin is a 86 y.o. male with medical history significant of systolic dysfunction CHF, nonischemic ischemic cardiomyopathy, pacemaker placement, hypertension, diabetes, hyperlipidemia, frequent PVCs who came to the ER with progressive lower extremity edema and worsening exertional dyspnea.   OT comments  Pt fanning himself and stating he felt like he was floating. VSS.  Focused on not going home as he had anticipated today. Educated in energy conservation strategies. Encouragement needed for OOB to bathroom and sink. Declined remaining up in chair.  Follow Up Recommendations  Home health OT;Supervision/Assistance - 24 hour    Equipment Recommendations  3 in 1 bedside commode    Recommendations for Other Services      Precautions / Restrictions Precautions Precautions: Fall Restrictions Weight Bearing Restrictions: No       Mobility Bed Mobility Overal bed mobility: Needs Assistance Bed Mobility: Supine to Sit;Sit to Supine     Supine to sit: Min guard Sit to supine: Min guard   General bed mobility comments: minguard for safety;pt with increassed time and effort  Transfers Overall transfer level: Needs assistance Equipment used: Rolling walker (2 wheeled) Transfers: Sit to/from Stand Sit to Stand: Min guard;Min assist              Balance Overall balance assessment: Needs assistance   Sitting balance-Leahy Scale: Fair       Standing balance-Leahy Scale: Fair Standing balance comment: statically                           ADL either performed or assessed with clinical judgement   ADL Overall ADL's : Needs assistance/impaired                       Lower Body Dressing Details (indicate cue type and reason): donns socks in recliner, does not want AE Toilet  Transfer: Min guard;Ambulation;BSC   Toileting- Clothing Manipulation and Hygiene: Minimal assistance;Sit to/from stand       Functional mobility during ADLs: Min guard;Rolling walker General ADL Comments: educated pt and daughter in energy conservation strategies and reinforced with written handout.     Vision       Perception     Praxis      Cognition Arousal/Alertness: Awake/alert Behavior During Therapy: Restless Overall Cognitive Status: Impaired/Different from baseline Area of Impairment: Safety/judgement                         Safety/Judgement: Decreased awareness of safety;Decreased awareness of deficits     General Comments: Pt stating he feels like he is floating. Difficulty switching attention from not going home as he had expected.        Exercises     Shoulder Instructions       General Comments      Pertinent Vitals/ Pain       Pain Assessment: No/denies pain  Home Living                                          Prior Functioning/Environment              Frequency  Min 2X/week        Progress Toward Goals  OT Goals(current goals can now be found in the care plan section)  Progress towards OT goals: Progressing toward goals  Acute Rehab OT Goals Patient Stated Goal: to go home OT Goal Formulation: With patient Time For Goal Achievement: 12/30/19 Potential to Achieve Goals: Good  Plan Discharge plan remains appropriate    Co-evaluation                 AM-PAC OT "6 Clicks" Daily Activity     Outcome Measure   Help from another person eating meals?: A Little Help from another person taking care of personal grooming?: A Little Help from another person toileting, which includes using toliet, bedpan, or urinal?: A Little Help from another person bathing (including washing, rinsing, drying)?: A Little Help from another person to put on and taking off regular upper body clothing?: A Little Help  from another person to put on and taking off regular lower body clothing?: A Little 6 Click Score: 18    End of Session Equipment Utilized During Treatment: Gait belt;Rolling walker  OT Visit Diagnosis: Unsteadiness on feet (R26.81);Other abnormalities of gait and mobility (R26.89);Muscle weakness (generalized) (M62.81);Other symptoms and signs involving cognitive function   Activity Tolerance Patient tolerated treatment well   Patient Left in bed;with call bell/phone within reach;with bed alarm set;with family/visitor present   Nurse Communication          Time: 1435-1450 OT Time Calculation (min): 15 min  Charges: OT General Charges $OT Visit: 1 Visit OT Treatments $Self Care/Home Management : 8-22 mins  Nestor Lewandowsky, OTR/L Acute Rehabilitation Services Pager: [phone] Office: [phone]   Malka So 12/17/2019, 4:35 PM

## 2019-12-17 NOTE — Progress Notes (Addendum)
PROGRESS NOTE    [employer]  GXQ:[id number] DOB: [birth year] DOA: 12/11/2019 PCP: Caren Macadam, MD  Brief Narrative: 86 year old male with history of nonischemic cardiomyopathy, EF of 30%, chronic left bundle branch block, ICD, chronic atrial fibrillation previously on Coumadin, hypertension, dyslipidemia, BPH was admitted with acute on chronic diastolic CHF on 1/1. -Cardiology following, improving with diuresis  Assessment & Plan:   Acute on chronic systolic CHF/BiV ICD -Echo with EF of 30 to 35%, severe dilated LA/RA, mild to moderate MR, aortic valve sclerosis -Clinically improving with diuresis, he is 4.4 L negative, creatinine has bumped slightly to 1.3 -Hold Lasix and lisinopril today -Clinically appears euvolemic -Weaned off O2 -Continue carvedilol  Permanent atrial fibrillation -Continue Eliquis, low-dose carvedilol added  ?  Pneumonia -Suspect symptoms and admission is secondary to CHF only and not pneumonia -Completed 5 days of antibiotics, does not need any more  Type 2 diabetes mellitus -Stable, hemoglobin A1c is 7.0 -Discontinued glimepiride, would not advocate therapy given advanced age, risk of hypoglycemia  BPH -Continue Flomax  Mild dementia -Continue Aricept  Gout -Continue allopurinol  DVT prophylaxis: Eliquis Code Status: Partial Code, daughter doesn't want intubation but otherwise wanst CPR/defibrillation Family Communication: No family at bedside, called and d/w daughter Disposition Plan: Home tomorrow if stable  Consultants:   Cardiology   Procedures:   Antimicrobials:    Subjective: -Feels okay, breathing overall improving,  Objective: Vitals:   12/16/19 1233 12/16/19 1629 12/16/19 2100 12/17/19 0456  BP: 115/74 109/69 106/69 123/74  Pulse: (!) 55 (!) 116 (!) 53 (!) 51  Resp: 18 20 19 20   Temp: 98 F (36.7 C)  97.7 F (36.5 C) 97.7 F (36.5 C)  TempSrc: Oral  Oral Oral  SpO2: 98% 97% 97% 94%  Weight:     101.2 kg  Height:        Intake/Output Summary (Last 24 hours) at 12/17/2019 1141 Last data filed at 12/17/2019 1010 Gross per 24 hour  Intake 1060 ml  Output 1200 ml  Net -140 ml   Filed Weights   12/15/19 0429 12/16/19 0439 12/17/19 0456  Weight: 99.5 kg 101 kg 101.2 kg    Examination:  Gen: Elderly male sitting up in bed, AAOx3, no distress HEENT: PERRLA, Neck supple, no JVD Lungs: Rare basilar rales, otherwise clear  CVS: RRR,No Gallops,Rubs or new Murmurs Abd: soft, Non tender, non distended, BS present Extremities: Trace edema Skin: no new rashes Psychiatry:Mood & affect appropriate.     Data Reviewed:   CBC: Recent Labs  Lab 12/11/19 1245 12/12/19 0415 12/15/19 0453 12/16/19 0431 12/17/19 0505  WBC 8.5 7.3 7.2 7.1 7.4  NEUTROABS  --  4.8 5.1 5.0  --   HGB 12.6* 11.8* 12.8* 12.3* 13.2  HCT 38.1* 35.7* 37.6* 37.5* 39.3  MCV 96.2 96.0 94.0 96.6 94.0  PLT 136* 122* 108* 117* 144*   Basic Metabolic Panel: Recent Labs  Lab 12/13/19 0850 12/14/19 0708 12/15/19 0453 12/16/19 0431 12/17/19 0505  NA 138 139 137 138 136  K 3.6 3.3* 3.8 3.9 5.3*  CL 102 99 96* 99 99  CO2 27 29 28 28 29   GLUCOSE 173* 150* 141* 149* 155*  BUN 32* 34* 33* 35* 45*  CREATININE 1.13 1.21 1.01 0.94 1.30*  CALCIUM 8.4* 8.4* 8.6* 8.6* 8.8*  MG 1.6*  --  1.7  --   --    GFR: Estimated Creatinine Clearance: 45.3 mL/min (A) (by C-G formula based on SCr of 1.3 mg/dL (H)). Liver  Function Tests: Recent Labs  Lab 12/11/19 1338  AST 28  ALT 31  ALKPHOS 83  BILITOT 1.3*  PROT 6.4*  ALBUMIN 3.4*   No results for input(s): LIPASE, AMYLASE in the last 168 hours. No results for input(s): AMMONIA in the last 168 hours. Coagulation Profile: No results for input(s): INR, PROTIME in the last 168 hours. Cardiac Enzymes: No results for input(s): CKTOTAL, CKMB, CKMBINDEX, TROPONINI in the last 168 hours. BNP (last 3 results) No results for input(s): PROBNP in the last 8760  hours. HbA1C: No results for input(s): HGBA1C in the last 72 hours. CBG: Recent Labs  Lab 12/16/19 0633 12/16/19 1140 12/16/19 1618 12/16/19 2113 12/17/19 0653  GLUCAP 157* 187* 147* 163* 155*   Lipid Profile: No results for input(s): CHOL, HDL, LDLCALC, TRIG, CHOLHDL, LDLDIRECT in the last 72 hours. Thyroid Function Tests: No results for input(s): TSH, T4TOTAL, FREET4, T3FREE, THYROIDAB in the last 72 hours. Anemia Panel: No results for input(s): VITAMINB12, FOLATE, FERRITIN, TIBC, IRON, RETICCTPCT in the last 72 hours. Urine analysis:    Component Value Date/Time   COLORURINE YELLOW 09/26/2015 1424   APPEARANCEUR CLEAR 09/26/2015 1424   LABSPEC 1.015 09/26/2015 1424   PHURINE 5.5 09/26/2015 1424   GLUCOSEU NEGATIVE 09/26/2015 1424   HGBUR NEGATIVE 09/26/2015 1424   BILIRUBINUR NEGATIVE 09/26/2015 1424   KETONESUR NEGATIVE 09/26/2015 1424   PROTEINUR NEGATIVE 02/08/2015 0900   UROBILINOGEN 0.2 09/26/2015 1424   NITRITE NEGATIVE 09/26/2015 1424   LEUKOCYTESUR NEGATIVE 09/26/2015 1424   Sepsis Labs: @LABRCNTIP (procalcitonin:4,lacticidven:4)  ) Recent Results (from the past 240 hour(s))  SARS CORONAVIRUS 2 (TAT 6-24 HRS) Nasopharyngeal Nasopharyngeal Swab     Status: None   Collection Time: 12/11/19  6:25 PM   Specimen: Nasopharyngeal Swab  Result Value Ref Range Status   SARS Coronavirus 2 NEGATIVE NEGATIVE Final    Comment: (NOTE) SARS-CoV-2 target nucleic acids are NOT DETECTED. The SARS-CoV-2 RNA is generally detectable in upper and lower respiratory specimens during the acute phase of infection. Negative results do not preclude SARS-CoV-2 infection, do not rule out co-infections with other pathogens, and should not be used as the sole basis for treatment or other patient management decisions. Negative results must be combined with clinical observations, patient history, and epidemiological information. The expected result is Negative. Fact Sheet for  Patients: 02/08/20 Fact Sheet for Healthcare Providers: HairSlick.no This test is not yet approved or cleared by the quierodirigir.com FDA and  has been authorized for detection and/or diagnosis of SARS-CoV-2 by FDA under an Emergency Use Authorization (EUA). This EUA will remain  in effect (meaning this test can be used) for the duration of the COVID-19 declaration under Section 56 4(b)(1) of the Act, 21 U.S.C. section 360bbb-3(b)(1), unless the authorization is terminated or revoked sooner. Performed at [facility], [address], [place], [place]          Radiology Studies: No results found.      Scheduled Meds: . allopurinol  100 mg Oral Daily  . apixaban  5 mg Oral BID  . atorvastatin  20 mg Oral QPC supper  . carvedilol  3.125 mg Oral BID WC  . cholecalciferol  1,000 Units Oral Daily  . docusate sodium  100 mg Oral QPC supper  . donepezil  5 mg Oral QHS  . magnesium oxide  400 mg Oral 2 times per day  . Melatonin  4.5 mg Oral QHS  . sodium chloride flush  3 mL Intravenous Q12H  .  tamsulosin  0.4 mg Oral BID PC   Continuous Infusions: . sodium chloride       LOS: 6 days    Time spent:  Zannie Cove, MD [facility]  12/17/2019, 11:41 AM

## 2019-12-18 ENCOUNTER — Telehealth: Payer: Self-pay | Admitting: *Deleted

## 2019-12-18 LAB — CBC
HCT: 40.2 % (ref 39.0–52.0)
Hemoglobin: 13.3 g/dL (ref 13.0–17.0)
MCH: 31.1 pg (ref 26.0–34.0)
MCHC: 33.1 g/dL (ref 30.0–36.0)
MCV: 93.9 fL (ref 80.0–100.0)
Platelets: 127 10*3/uL — ABNORMAL LOW (ref 150–400)
RBC: 4.28 MIL/uL (ref 4.22–5.81)
RDW: 13.9 % (ref 11.5–15.5)
WBC: 8.1 10*3/uL (ref 4.0–10.5)
nRBC: 0 % (ref 0.0–0.2)

## 2019-12-18 LAB — BASIC METABOLIC PANEL
Anion gap: 11 (ref 5–15)
Anion gap: 9 (ref 5–15)
BUN: 55 mg/dL — ABNORMAL HIGH (ref 8–23)
BUN: 53 mg/dL — ABNORMAL HIGH (ref 8–23)
CO2: 25 mmol/L (ref 22–32)
CO2: 29 mmol/L (ref 22–32)
Calcium: 8.7 mg/dL — ABNORMAL LOW (ref 8.9–10.3)
Calcium: 8.7 mg/dL — ABNORMAL LOW (ref 8.9–10.3)
Chloride: 91 mmol/L — ABNORMAL LOW (ref 98–111)
Chloride: 92 mmol/L — ABNORMAL LOW (ref 98–111)
Creatinine, Ser: 1.39 mg/dL — ABNORMAL HIGH (ref 0.61–1.24)
Creatinine, Ser: 1.36 mg/dL — ABNORMAL HIGH (ref 0.61–1.24)
GFR calc Af Amer: 52 mL/min — ABNORMAL LOW (ref >60)
GFR calc Af Amer: 53 mL/min — ABNORMAL LOW (ref >60)
GFR calc non Af Amer: 45 mL/min — ABNORMAL LOW (ref >60)
GFR calc non Af Amer: 46 mL/min — ABNORMAL LOW (ref >60)
Glucose, Bld: 140 mg/dL — ABNORMAL HIGH (ref 70–99)
Glucose, Bld: 128 mg/dL — ABNORMAL HIGH (ref 70–99)
Potassium: 4.9 mmol/L (ref 3.5–5.1)
Potassium: 5.2 mmol/L — ABNORMAL HIGH (ref 3.5–5.1)
Sodium: 127 mmol/L — ABNORMAL LOW (ref 135–145)
Sodium: 130 mmol/L — ABNORMAL LOW (ref 135–145)

## 2019-12-18 LAB — 5 HIAA, QUANTITATIVE, URINE, 24 HOUR
5 HIAA, 24 Hour Urine: 7.7 mg/24 h — ABNORMAL HIGH (ref <=6.0)
Total Volume: 850 mL

## 2019-12-18 LAB — GLUCOSE, CAPILLARY: Glucose-Capillary: 134 mg/dL — ABNORMAL HIGH (ref 70–99)

## 2019-12-18 MED ORDER — LISINOPRIL 2.5 MG PO TABS: 2.5000 mg | ORAL_TABLET | Freq: Every day | ORAL | Status: DC

## 2019-12-18 MED ORDER — CARVEDILOL 3.125 MG PO TABS: 3.1250 mg | ORAL_TABLET | Freq: Two times a day (BID) | ORAL | 0 refills | Status: DC

## 2019-12-18 MED ORDER — FUROSEMIDE 40 MG PO TABS: 80.0000 mg | ORAL_TABLET | Freq: Every day | ORAL | Status: DC

## 2019-12-18 NOTE — Telephone Encounter (Signed)
Spoke with Reuel Boom at [facility] [place] [facility] [phone] and he stated this test is done in the [place] facility which takes 2-3 days in travel time and this test is only ran certain days.  Transferred to the [place] facility ([id number]#[phone]) and spoke with Bridgette.  Bridgette stated the test is being repeated and results should be available at the end of today.  Message sent to PCP.

## 2019-12-18 NOTE — Telephone Encounter (Signed)
I left a detailed message at the pts daughter's cell number with the information below.   

## 2019-12-18 NOTE — TOC Transition Note (Signed)
Transition of Care [facility]) - CM/SW Discharge Note   Patient Details  Name: [name] MRN: [id number] Date of Birth: [birth year]  Transition of Care [facility]) CM/SW Contact:  Leone Haven, RN Phone Number: 12/18/2019, 9:13 AM   Clinical Narrative:     NCM offered choice, he does not have a preference, NCM made referral to Tiffany with [facility], she states they can take referral.  Soc will begin 24 to 48 hrs post dc.  Patient is for dc today, he is set up with [facility] for HHRN/HHPT, NCM notified Tiffany with [facility] of pateint's dc today.   Final next level of care: Home w Home Health Services Barriers to Discharge: No Barriers Identified   Patient Goals and CMS Choice   CMS Medicare.gov Compare Post Acute Care list provided to:: Patient Choice offered to / list presented to : Patient  Discharge Placement                       Discharge Plan and Services                DME Arranged: (NA)         HH Arranged: RN, PT, Disease Management HH Agency: [facility] Date HH Agency Contacted: 12/17/19 Time HH Agency Contacted: 1000 Representative spoke with at [facility] Agency: Tiffany  Social Determinants of Health (SDOH) Interventions     Readmission Risk Interventions No flowsheet data found.

## 2019-12-18 NOTE — Telephone Encounter (Signed)
Ordered by Dr. Caryl Never; looks like results are still pending. Please check to see if results available yet? Sometimes these test results take longer though. If results are back let me know so we can advise.

## 2019-12-18 NOTE — Telephone Encounter (Signed)
Copied from CRM [id number]. Topic: General - Inquiry >> Dec 18, 2019  9:03 AM Wyonia Hough E wrote: Reason for CRM: Pts daughter brought by urine on 12.30.20 for a 24 hr result and has not heard anything./ pt is in the hospital and nurse is asking about the result/ please advise today about the urine results

## 2019-12-18 NOTE — Telephone Encounter (Signed)
Ok; please just let daughter know. We will call when we see it. Cc'd Dr. Caryl Never since he ordered lab.

## 2019-12-18 NOTE — Progress Notes (Signed)
Physical Therapy Treatment Patient Details Name: [name] MRN: [id number] DOB: October 15, 1929 Today's Date: 12/18/2019    History of Present Illness Ronie Council Kim is a 86 y.o. male with medical history significant of systolic dysfunction CHF, nonischemic ischemic cardiomyopathy, pacemaker placement, hypertension, diabetes, hyperlipidemia, frequent PVCs who came to the ER with progressive lower extremity edema and worsening exertional dyspnea.    PT Comments    Patient seen for mobility progression.  Pt requires min guard/min A for mobility this session using SPC. Pt with LOB requiring assistance to recover/prevent fall. Pt reports his daughter will be with him all the upon d/c. Current plan remains appropriate.    Follow Up Recommendations  Home health PT;Supervision for mobility/OOB     Equipment Recommendations  None recommended by PT    Recommendations for Other Services       Precautions / Restrictions Precautions Precautions: Fall Restrictions Weight Bearing Restrictions: No    Mobility  Bed Mobility Overal bed mobility: Needs Assistance Bed Mobility: Supine to Sit     Supine to sit: Min guard     General bed mobility comments: minguard for safety; pt with increassed time and effort  Transfers Overall transfer level: Needs assistance Equipment used: Straight cane Transfers: Sit to/from Stand Sit to Stand: Min guard;Min assist         General transfer comment: pt stood from EOB and BSC assist to steady; cues for safe hand placement  Ambulation/Gait Ambulation/Gait assistance: Min guard;Min assist Gait Distance (Feet): 100 Feet Assistive device: Straight cane Gait Pattern/deviations: Step-to pattern;Decreased stride length;Trunk flexed Gait velocity: decreased   General Gait Details: pt "furniture walking" in addition to use of SPC in room; grossly min guard for gait in hallway and with LOB anteriorly when returning to room requiring  assistance to recover and prevent fall   Stairs             Wheelchair Mobility    Modified Rankin (Stroke Patients Only)       Balance Overall balance assessment: Needs assistance   Sitting balance-Leahy Scale: Fair       Standing balance-Leahy Scale: Fair                              Cognition Arousal/Alertness: Awake/alert Behavior During Therapy: WFL for tasks assessed/performed Overall Cognitive Status: Impaired/Different from baseline Area of Impairment: Safety/judgement                         Safety/Judgement: Decreased awareness of safety;Decreased awareness of deficits            Exercises      General Comments        Pertinent Vitals/Pain Pain Assessment: No/denies pain    Home Living                      Prior Function            PT Goals (current goals can now be found in the care plan section) Acute Rehab PT Goals Patient Stated Goal: to go home Progress towards PT goals: Progressing toward goals    Frequency    Min 3X/week      PT Plan Current plan remains appropriate    Co-evaluation              AM-PAC PT "6 Clicks" Mobility   Outcome Measure  Help needed turning from your  back to your side while in a flat bed without using bedrails?: A Little Help needed moving from lying on your back to sitting on the side of a flat bed without using bedrails?: A Little Help needed moving to and from a bed to a chair (including a wheelchair)?: A Little Help needed standing up from a chair using your arms (e.g., wheelchair or bedside chair)?: A Little Help needed to walk in hospital room?: A Little Help needed climbing 3-5 steps with a railing? : A Little 6 Click Score: 18    End of Session Equipment Utilized During Treatment: Gait belt Activity Tolerance: Patient tolerated treatment well Patient left: in chair;with call bell/phone within reach Nurse Communication: Mobility status PT Visit  Diagnosis: Unsteadiness on feet (R26.81);Difficulty in walking, not elsewhere classified (R26.2)     Time: 4827-0786 PT Time Calculation (min) (ACUTE ONLY): 19 min  Charges:  $Gait Training: 8-22 mins                     Erline Levine, PTA Acute Rehabilitation Services Pager: [phone] Office: [phone]     Carolynne Edouard 12/18/2019, 12:11 PM

## 2019-12-20 NOTE — Discharge Summary (Addendum)
Physician Discharge Summary  [place] PZW:[id number] DOB: 1929/01/31 DOA: 12/11/2019  PCP: Caren Macadam, MD  Admit date: 12/11/2019 Discharge date: 12/18/2019  Time spent: 35 minutes  Recommendations for Outpatient Follow-up:  1. PCP Dr. Elease Hashimoto in 1 week, restart lisinopril and likely potassium on 1/11 if kidney function stable and without hyperkalemia 2. B met on 1/11, [facility] in 1 week 3. Please consider goals of care discussions especially CODE STATUS discussion again at follow-up 4. Home health PT, OT, RN   Discharge Diagnoses:  Principal Problem:   Acute on chronic combined systolic and diastolic CHF (congestive heart failure) (HCC) Mild cognitive deficits/early dementia   DM (diabetes mellitus) type II controlled with renal manifestation (HCC)   Nephropathy, diabetic (HCC)   Dyslipidemia   Essential hypertension   MYOCARDIAL INFARCTION, HX OF   Atrial fibrillation (HCC)   Edema   Long term (current) use of anticoagulants   Systolic CHF, chronic (HCC)   Hyperlipidemia   GERD (gastroesophageal reflux disease)   Bradycardia   Discharge Condition: Stable  Diet recommendation: Low-sodium, diabetic, heart healthy  Filed Weights   12/16/19 0439 12/17/19 0456 12/18/19 0452  Weight: 101 kg 101.2 kg 101.9 kg    History of present illness:  86 year old male with history of nonischemic cardiomyopathy, EF of 30%, chronic left bundle branch block, ICD, chronic atrial fibrillation previously on Coumadin, hypertension, dyslipidemia, BPH was admitted with acute on chronic diastolic CHF on 1/1.  Hospital Course:   Acute on chronic systolic CHF/BiV ICD -Echo with EF of 30 to 35%, severe dilated LA/RA, mild to moderate MR, aortic valve sclerosis -Clinically improved with diuresis, he is 4.4 L negative -Creatinine and potassium bumped slightly hence we held his Lasix today recommended that he resume his Lasix tomorrow i.e. 1/9 and hold his lisinopril and  potassium supplements until repeat labs on Monday 1/11 -Clinically appears euvolemic, weaned off oxygen, continued on carvedilol -Patient is elderly, has mild cognitive deficits, is at risk of ongoing decline with his cardiomyopathy, recommend goals of care discussions especially to address CODE STATUS again with patient and family at follow-up. -Follow-up with PCP, cardiology PA in 1 week, repeat labs on Monday 1/11  Permanent atrial fibrillation -Continue Eliquis, low-dose carvedilol added  ?  Pneumonia -Suspect symptoms and admission is secondary to CHF only and not pneumonia -Completed 5 days of antibiotics, does not need any more  Type 2 diabetes mellitus -Stable, hemoglobin A1c is 7.0 -Discontinued glimepiride, would not advocate therapy given advanced age, risk of hypoglycemia  BPH -Continue Flomax  Mild dementia -Continue Aricept  Gout -Continue allopurinol  Code Status: Partial Code, daughter doesn't want intubation but otherwise wants CPR/defibrillation based on our discussion 1/6, recommend continue ongoing goals of care discussions given high risk of decline   Consultations:  Cardiology  Discharge Exam: Vitals:   12/18/19 0459 12/18/19 0930  BP: 108/60 113/80  Pulse: 73 69  Resp: 18   Temp:    SpO2: 90% 94%    General: AAOx2 Cardiovascular: S1-S2, regular rate rhythm Respiratory: Improved air movement, clear  Discharge Instructions   Discharge Instructions    Diet - low sodium heart healthy   Complete by: As directed    Increase activity slowly   Complete by: As directed      Allergies as of 12/18/2019   No Known Allergies     Medication List    STOP taking these medications   potassium chloride SA 20 MEQ tablet Commonly known as: KLOR-CON  TAKE these medications   acetaminophen 650 MG CR tablet Commonly known as: TYLENOL Take 1,300 mg by mouth at bedtime as needed for pain.   allopurinol 100 MG tablet Commonly known as:  ZYLOPRIM TAKE 1 TABLET EVERY DAY What changed: when to take this   apixaban 5 MG Tabs tablet Commonly known as: Eliquis TAKE 1 TABLET(5 MG) BY MOUTH TWICE DAILY What changed:   how much to take  how to take this  when to take this  additional instructions   atorvastatin 20 MG tablet Commonly known as: LIPITOR TAKE 1 TABLET EVERY DAY What changed: when to take this   carvedilol 3.125 MG tablet Commonly known as: COREG Take 1 tablet (3.125 mg total) by mouth 2 (two) times daily with a meal.   cholecalciferol 1000 units tablet Commonly known as: VITAMIN D Take 1,000 Units by mouth daily after breakfast.   donepezil 5 MG tablet Commonly known as: ARICEPT TAKE 1 TABLET  BY MOUTH AT BEDTIME.   furosemide 40 MG tablet Commonly known as: LASIX Take 2 tablets (80 mg total) by mouth daily. What changed:   how much to take  when to take this  additional instructions   lisinopril 2.5 MG tablet Commonly known as: ZESTRIL Take 1 tablet (2.5 mg total) by mouth daily. Start taking on: December 21, 2019 What changed: See the new instructions.   magnesium oxide 400 (241.3 Mg) MG tablet Commonly known as: MAG-OX TAKE 1 TABLET (400 MG TOTAL) BY MOUTH 2 (TWO) TIMES DAILY. What changed:   when to take this  additional instructions   MELATONIN ER PO Take 1 tablet by mouth at bedtime.   STOOL SOFTENER PO Take 2 capsules by mouth daily after supper.   tamsulosin 0.4 MG Caps capsule Commonly known as: FLOMAX TAKE 1 CAPSULE TWICE DAILY What changed: when to take this      No Known Allergies Follow-up Information    Deberah Pelton, NP Follow up on 12/23/2019.   Specialty: Cardiology Why: Please go to hospital follow-up January 13th at 10:15 AM Contact information: [address] [place] [address] Leonardo [place] Follow up on 12/21/2019.   Specialty: Cardiology Why: Please go to [facility] office for blood  work January 11th from [place] - 4:30 PM Contact information: [place] Follow up.   Specialty: Home Health Services Why: [facility], HHPT Contact information: [address] [place] [address] [phone]            The results of significant diagnostics from this hospitalization (including imaging, microbiology, ancillary and laboratory) are listed below for reference.    Significant Diagnostic Studies: DG Chest 2 View  Result Date: 12/14/2019 CLINICAL DATA:  Shortness of breath. Pneumonia. EXAM: CHEST - 2 VIEW COMPARISON:  One-view chest x-ray 12/12/2019. FINDINGS: The heart is enlarged. Pacing and defibrillator wires are stable. Right greater than left airspace disease is similar the prior study. Bilateral effusions are again noted, right greater than left. IMPRESSION: 1. Stable appearance of right greater than left airspace disease compatible with multifocal pneumonia. 2. Stable bilateral pleural effusions, right greater than left. Electronically Signed   By: San Morelle M.D.   On: 12/14/2019 14:17   DG CHEST PORT 1 VIEW  Result Date: 12/12/2019 CLINICAL DATA:  Shortness of breath EXAM: PORTABLE CHEST 1 VIEW COMPARISON:  12/11/2019 FINDINGS: Cardiac shadow remains enlarged. Pacing device is again noted and stable. Patchy airspace opacities are again seen and stable. The overall inspiratory effort is poor. No bony abnormality is noted. IMPRESSION: Stable patchy infiltrates bilaterally. Electronically Signed   By: Inez Catalina M.D.   On: 12/12/2019 11:46   DG Chest Portable 1 View  Result Date: 12/11/2019 CLINICAL DATA:  Shortness of breath EXAM: PORTABLE CHEST 1 VIEW COMPARISON:  02/19/2018 FINDINGS: Stable positioning of a left-sided implanted cardiac device. Heart size remains enlarged. Calcific aortic knob. Hazy airspace opacities most pronounced within the peripheral aspect of the  right mid and lower lung fields. Possible trace bilateral pleural effusions. No pneumothorax. Severe degenerative changes of the bilateral shoulders. IMPRESSION: 1. Hazy airspace opacity within the right mid and lower lung fields, suspicious for pneumonia. 2. Possible trace bilateral pleural effusions. 3. Stable cardiomegaly. Electronically Signed   By: Davina Poke D.O.   On: 12/11/2019 13:27   ECHOCARDIOGRAM COMPLETE  Result Date: 12/12/2019   ECHOCARDIOGRAM REPORT   Patient Name:   [name] Date of Exam: 12/12/2019 Medical Rec #:  [id number]           Height:       68.0 in Accession #:    [id number]          Weight:       218.7 lb Date of Birth:  Apr 17, 1929            BSA:          2.12 m Patient Age:    86 years            BP:           110/72 mmHg Patient Gender: M                   HR:           52 bpm. Exam Location:  Inpatient Procedure: 2D Echo and Intracardiac Opacification Agent Indications:    CHF-Acute Systolic 426.83 / M19.62  History:        Patient has prior history of Echocardiogram examinations, most                 recent 02/19/2018. Pacemaker, Arrythmias:Atrial Fibrillation,                 Signs/Symptoms:Dyspnea; Risk Factors:Hypertension, Dyslipidemia                 and Diabetes. Cardiomyoapthy. Lower extremity edema.  Sonographer:    Darlina Sicilian RDCS Referring Phys: [place]  1. Left ventricular ejection fraction, by visual estimation, is 30 to 35%. The left ventricle has moderate to severely decreased function. There is no left ventricular hypertrophy.  2. Definity contrast agent was given IV to delineate the left ventricular endocardial borders.  3. Left ventricular diastolic function could not be evaluated.  4. The left ventricle demonstrates global hypokinesis.  5. Global right ventricle has normal systolic function.The right ventricular size is normal. No increase in right ventricular wall thickness.  6. Left atrial size was severely dilated.  7.  Right atrial size was severely dilated.  8. The mitral valve is normal in structure. Mild to moderate mitral valve regurgitation. No evidence of mitral stenosis.  9. The tricuspid valve is normal in structure. 10. The aortic valve is normal in structure. Aortic valve regurgitation is not visualized. Mild to moderate aortic valve sclerosis/calcification without any evidence of aortic stenosis. 11. The  pulmonic valve was normal in structure. Pulmonic valve regurgitation is trivial. 12. Aortic dilatation noted. 13. There is mild dilatation of the ascending aorta measuring 42 mm. 14. Compared with the echo 02/2018, the ascending aorta has increased from 3.8 cm to 4.2 cm. 15. Moderately elevated pulmonary artery systolic pressure. 16. A pacer wire is visualized. 17. The inferior vena cava is normal in size with greater than 50% respiratory variability, suggesting right atrial pressure of 3 mmHg. FINDINGS  Left Ventricle: Left ventricular ejection fraction, by visual estimation, is 30 to 35%. The left ventricle has moderate to severely decreased function. Definity contrast agent was given IV to delineate the left ventricular endocardial borders. The left ventricle demonstrates global hypokinesis. There is no left ventricular hypertrophy. The left ventricular diastology could not be evaluated due to atrial fibrillation. Left ventricular diastolic function could not be evaluated. Normal left atrial pressure. Right Ventricle: The right ventricular size is normal. No increase in right ventricular wall thickness. Global RV systolic function is has normal systolic function. The tricuspid regurgitant velocity is 2.51 m/s, and with an assumed right atrial pressure  of 15 mmHg, the estimated right ventricular systolic pressure is moderately elevated at 40.2 mmHg. Left Atrium: Left atrial size was severely dilated. Right Atrium: Right atrial size was severely dilated Pericardium: There is no evidence of pericardial effusion. Mitral  Valve: The mitral valve is normal in structure. Mild to moderate mitral valve regurgitation. No evidence of mitral valve stenosis by observation. Tricuspid Valve: The tricuspid valve is normal in structure. Tricuspid valve regurgitation mild-moderate. Aortic Valve: The aortic valve is normal in structure. Aortic valve regurgitation is not visualized. Mild to moderate aortic valve sclerosis/calcification is present, without any evidence of aortic stenosis. There is moderate calcification of the aortic valve. Aortic valve mean gradient measures 3.5 mmHg. Aortic valve peak gradient measures 6.2 mmHg. Aortic valve area, by VTI measures 3.69 cm. Pulmonic Valve: The pulmonic valve was normal in structure. Pulmonic valve regurgitation is trivial. Pulmonic regurgitation is trivial. Aorta: The aortic root, ascending aorta and aortic arch are all structurally normal, with no evidence of dilitation or obstruction and aortic dilatation noted. There is mild dilatation of the ascending aorta measuring 42 mm. Compared with the echo 02/2018, the ascending aorta has increased from 3.8 cm to 4.2 cm. Venous: The inferior vena cava is normal in size with greater than 50% respiratory variability, suggesting right atrial pressure of 3 mmHg. IAS/Shunts: No atrial level shunt detected by color flow Doppler. There is no evidence of a patent foramen ovale. No ventricular septal defect is seen or detected. There is no evidence of an atrial septal defect. Additional Comments: A pacer wire is visualized.  LEFT VENTRICLE PLAX 2D LVIDd:         6.97 cm LVIDs:         6.06 cm LV PW:         1.18 cm LV IVS:        0.79 cm LVOT diam:     2.60 cm LV SV:         69 ml LV SV Index:   31.06 LVOT Area:     5.31 cm  LV Volumes (MOD) LV area d, A2C:    48.70 cm LV area d, A4C:    46.18 cm LV area s, A2C:    37.30 cm LV area s, A4C:    36.40 cm LV major d, A2C:   8.37 cm LV major d, A4C:   8.56 cm  LV major s, A2C:   7.11 cm LV major s, A4C:   8.83 cm  LV vol d, MOD A2C: 227.0 ml LV vol d, MOD A4C: 208.8 ml LV vol s, MOD A2C: 158.0 ml LV vol s, MOD A4C: 124.0 ml LV SV MOD A2C:     69.0 ml LV SV MOD A4C:     208.8 ml LV SV MOD BP:      62.7 ml RIGHT VENTRICLE TAPSE (M-mode): 1.7 cm LEFT ATRIUM              Index       RIGHT ATRIUM           Index LA diam:        5.50 cm  2.59 cm/m  RA Area:     31.50 cm LA Vol (A2C):   166.0 ml 78.20 ml/m RA Volume:   107.00 ml 50.40 ml/m LA Vol (A4C):   173.0 ml 81.49 ml/m LA Biplane Vol: 170.0 ml 80.08 ml/m  AORTIC VALVE AV Area (Vmax):    3.96 cm AV Area (Vmean):   3.75 cm AV Area (VTI):     3.69 cm AV Vmax:           124.00 cm/s AV Vmean:          85.300 cm/s AV VTI:            0.171 m AV Peak Grad:      6.2 mmHg AV Mean Grad:      3.5 mmHg LVOT Vmax:         92.40 cm/s LVOT Vmean:        60.200 cm/s LVOT VTI:          0.119 m LVOT/AV VTI ratio: 0.70  AORTA Ao Root diam: 3.40 cm Ao Asc diam:  4.20 cm MR Peak grad: 70.6 mmHg   TRICUSPID VALVE MR Mean grad: 47.0 mmHg   TR Peak grad:   25.2 mmHg MR Vmax:      420.00 cm/s TR Vmax:        251.00 cm/s MR Vmean:     324.0 cm/s                           SHUNTS                           Systemic VTI:  0.12 m                           Systemic Diam: 2.60 cm  Skeet Latch MD Electronically signed by Skeet Latch MD Signature Date/Time: 12/12/2019/2:21:27 PM    Final    CUP PACEART REMOTE DEVICE CHECK  Result Date: 12/03/2019 Scheduled remote reviewed.  Normal device function.  5 HVR episodes; longest lasting 3 seconds. VP reported at 65% since 10/20/19, recent increase in ventricular rate noted, and thoracic impedance below baseline.  Will forward for review. Next remote 91 days.  [facility]   Microbiology: Recent Results (from the past 240 hour(s))  SARS CORONAVIRUS 2 (TAT 6-24 HRS) Nasopharyngeal Nasopharyngeal Swab     Status: None   Collection Time: 12/11/19  6:25 PM   Specimen: Nasopharyngeal Swab  Result Value Ref Range Status   SARS Coronavirus 2 NEGATIVE  NEGATIVE Final    Comment: (NOTE) SARS-CoV-2 target nucleic acids are NOT DETECTED. The SARS-CoV-2 RNA is generally detectable in  upper and lower respiratory specimens during the acute phase of infection. Negative results do not preclude SARS-CoV-2 infection, do not rule out co-infections with other pathogens, and should not be used as the sole basis for treatment or other patient management decisions. Negative results must be combined with clinical observations, patient history, and epidemiological information. The expected result is Negative. Fact Sheet for Patients: SugarRoll.be Fact Sheet for Healthcare Providers: https://www.woods-mathews.com/ This test is not yet approved or cleared by the [place] FDA and  has been authorized for detection and/or diagnosis of SARS-CoV-2 by FDA under an Emergency Use Authorization (EUA). This EUA will remain  in effect (meaning this test can be used) for the duration of the COVID-19 declaration under Section 56 4(b)(1) of the Act, 21 U.S.C. section 360bbb-3(b)(1), unless the authorization is terminated or revoked sooner. Performed at [facility], [place] [address] [place],  [address]      Labs: Basic Metabolic Panel: Recent Labs  Lab 12/15/19 0453 12/16/19 0431 12/17/19 0505 12/18/19 0518 12/18/19 0751  NA 137 138 136 127* 130*  K 3.8 3.9 5.3* 4.9 5.2*  CL 96* 99 99 91* 92*  CO2 28 28 29 25 29   GLUCOSE 141* 149* 155* 140* 128*  BUN 33* 35* 45* 55* 53*  CREATININE 1.01 0.94 1.30* 1.39* 1.36*  CALCIUM 8.6* 8.6* 8.8* 8.7* 8.7*  MG 1.7  --   --   --   --    Liver Function Tests: No results for input(s): AST, ALT, ALKPHOS, BILITOT, PROT, ALBUMIN in the last 168 hours. No results for input(s): LIPASE, AMYLASE in the last 168 hours. No results for input(s): AMMONIA in the last 168 hours. CBC: Recent Labs  Lab 12/15/19 0453 12/16/19 0431 12/17/19 0505 12/18/19 0518  WBC  7.2 7.1 7.4 8.1  NEUTROABS 5.1 5.0  --   --   HGB 12.8* 12.3* 13.2 13.3  HCT 37.6* 37.5* 39.3 40.2  MCV 94.0 96.6 94.0 93.9  PLT 108* 117* 126* 127*   Cardiac Enzymes: No results for input(s): CKTOTAL, CKMB, CKMBINDEX, TROPONINI in the last 168 hours. BNP: BNP (last 3 results) Recent Labs    12/11/19 1638  BNP 1,035.4*    ProBNP (last 3 results) No results for input(s): PROBNP in the last 8760 hours.  CBG: Recent Labs  Lab 12/17/19 0653 12/17/19 1225 12/17/19 1618 12/17/19 2203 12/18/19 0655  GLUCAP 155* 151* 148* 162* 134*       Signed:  Domenic Polite MD.  [facility] 12/20/2019, 2:27 PM

## 2019-12-21 ENCOUNTER — Telehealth: Payer: Self-pay | Admitting: Family Medicine

## 2019-12-21 ENCOUNTER — Telehealth: Payer: Self-pay

## 2019-12-21 ENCOUNTER — Other Ambulatory Visit: Payer: Medicare HMO

## 2019-12-21 DIAGNOSIS — I5043 Acute on chronic combined systolic (congestive) and diastolic (congestive) heart failure: Secondary | ICD-10-CM | POA: Diagnosis not present

## 2019-12-21 DIAGNOSIS — I4821 Permanent atrial fibrillation: Secondary | ICD-10-CM | POA: Diagnosis not present

## 2019-12-21 DIAGNOSIS — I110 Hypertensive heart disease with heart failure: Secondary | ICD-10-CM | POA: Diagnosis not present

## 2019-12-21 DIAGNOSIS — E1129 Type 2 diabetes mellitus with other diabetic kidney complication: Secondary | ICD-10-CM | POA: Diagnosis not present

## 2019-12-21 DIAGNOSIS — F0390 Unspecified dementia without behavioral disturbance: Secondary | ICD-10-CM | POA: Diagnosis not present

## 2019-12-21 DIAGNOSIS — I428 Other cardiomyopathies: Secondary | ICD-10-CM | POA: Diagnosis not present

## 2019-12-21 DIAGNOSIS — I447 Left bundle-branch block, unspecified: Secondary | ICD-10-CM | POA: Diagnosis not present

## 2019-12-21 DIAGNOSIS — M1991 Primary osteoarthritis, unspecified site: Secondary | ICD-10-CM | POA: Diagnosis not present

## 2019-12-21 DIAGNOSIS — M109 Gout, unspecified: Secondary | ICD-10-CM | POA: Diagnosis not present

## 2019-12-21 NOTE — Telephone Encounter (Signed)
Transition Care Management Follow-up Telephone Call  Date of discharge and from where: 12/17/2018 from [facility]  How have you been since you were released from the hospital? "He's better than he was but he's still very weak".  Any questions or concerns? Yes, concerned about fluid starting to accumulated in feet and ankles again. Also, questioned start of home health services. Verified with [facility] that start of care would be 12/22/19. She was not able to transport him to get labs drawn today due to his weakness.   Items Reviewed:  Did the pt receive and understand the discharge instructions provided? Yes   Medications obtained and verified? Yes   Any new allergies since your discharge? No   Dietary orders reviewed? Yes  Do you have support at home? Yes   Other (ie: DME, Home Health, etc) yes   Functional Questionnaire: (I = Independent and D = Dependent) ADL's: Requires assistance with ADL's due to weakness.  Bathing/Dressing-    Meal Prep-   Eating-   Maintaining continence-   Transferring/Ambulation-   Managing Meds-    Follow up appointments reviewed:    PCP Hospital f/u appt confirmed? Yes  Scheduled to see yes Dr. Hassan Rowan 12/23/19.   Specialist Hospital f/u appt confirmed? Yes, to see [facility] on 12/23/19 also  Are transportation arrangements needed? No   If their condition worsens, is the pt aware to call  their PCP or go to the ED? Yes  Was the patient provided with contact information for the PCP's office or ED? Yes  Was the pt encouraged to call back with questions or concerns? Yes

## 2019-12-21 NOTE — Telephone Encounter (Signed)
Margaret [facility] called requesting verbals for skilled nursing and PT Call back [phone] ext [phone]

## 2019-12-21 NOTE — Telephone Encounter (Signed)
ok 

## 2019-12-21 NOTE — Telephone Encounter (Signed)
I left a detailed message on [name]'s voicemail with the orders as below.

## 2019-12-22 ENCOUNTER — Telehealth: Payer: Self-pay | Admitting: *Deleted

## 2019-12-22 ENCOUNTER — Telehealth: Payer: Self-pay | Admitting: Family Medicine

## 2019-12-22 DIAGNOSIS — R609 Edema, unspecified: Secondary | ICD-10-CM

## 2019-12-22 DIAGNOSIS — R600 Localized edema: Secondary | ICD-10-CM

## 2019-12-22 NOTE — Telephone Encounter (Signed)
Are they able to get labs on him at home?  It would be helpful to get CMP we can see kidney function, electrolytes and a better understanding of how we can adjust his medications to with fluid.  If this is possible, please fax order for CMP.  Discharge instructions from the hospital stated to do 80 mg of Lasix once daily, but if he is having good urine output with the 40 mg and they are able to do that twice a day, he may be getting more fluid out this way.  If you reach daughter, please stress importance of trying to get those legs elevated in the recliner.

## 2019-12-22 NOTE — Telephone Encounter (Signed)
Copied from CRM [id number]. Topic: Quick Conservator, museum/gallery Patient (Clinic Use ONLY) >> Dec 22, 2019  9:16 AM Leafy Ro wrote: Reason for CRM:pt daughter [name] is return jo ann call

## 2019-12-22 NOTE — Telephone Encounter (Signed)
Home Health Verbal Orders - Caller/Agency: Tanya Harden/ [facility] Number: [phone] Requesting OT/PT/Skilled Nursing/Social Work/Speech Therapy: skilled nursing Frequency: 2x 2 weeks, 1x 7weeks   If needing any lab work please fax requests to office. Also pt's daughter informed Ms.Kenney Houseman that while in hospital pt got rid of all fluid retention, however while in evaluation visit RN noticed 3+ pitting edmas. Please advise.

## 2019-12-22 NOTE — Telephone Encounter (Signed)
Lm to call back ./cy 

## 2019-12-22 NOTE — Telephone Encounter (Signed)
Please see prior note

## 2019-12-22 NOTE — Telephone Encounter (Signed)
Message Routed to PCP CMA 

## 2019-12-22 NOTE — Telephone Encounter (Signed)
Sounds like he is not feeling great. We will review everything tomorrow but I agree with ER if feeling any worse. And we did approve home health so they should be getting that soon.

## 2019-12-22 NOTE — Telephone Encounter (Signed)
Spoke with Kenney Houseman and she stated they can perform lab testing at the pts home.  Lab orders entered as below and faxed to [facility] at [phone].

## 2019-12-22 NOTE — Telephone Encounter (Signed)
Patient contacted regarding discharge from [facility] on 12/18/19.  Patient understands to follow up with provider Edd Fabian NP on 12/22/19 at 10:15 AM at [facility]. Patient understands discharge instructions? YES Patient understands medications and regiment? NO Patient understands to bring all medications to this visit? YES  SPOKE WITH DAUGHTER AND FIRST 2 DAYS AFTER DISCHARGE PT WAS DOING WELL.PT'S DAUGHTER NOTED ON Sunday SOME SWELLING STARTING TO RETURN AND EVEN LARGER NOTED ON Monday TO PT'S FEET AND ANKLES . PT'S B/P YESTERDAY WAS 82/60 HR AND PULSE OX WERE FINE PT IS WEAK WITH THIS LOW B/P PER DAUGHTER DOES NOT THINK WILL BE ABLE TO GET PT OUT TOMORROW FOR APPT DUE TO WEAKNESS. DAUGHTER IS GOING TO CHECK ON PT NOW AND WILL CALL BACK WITH UPDATE ALSO PT WAS NOT GIVEN THE LISINOPRIL YESTERDAY WILL FORWARD TO DR Jens Som FOR REVIEW

## 2019-12-22 NOTE — Telephone Encounter (Signed)
Okay for home health orders.  Please ensure that he is taking the Lasix?  It looks like on discharge summary he was supposed to be taking 2 of his 40 mg tablets (80 mg) daily?  Is he been doing this since being home?  Is he getting urine out with this?  Please make sure breathing has not worsened at all especially if he is retaining some fluid.  Also recommend keeping legs up above chest level if able and wearing compression stockings if they have them to help keep fluid level controlled.

## 2019-12-22 NOTE — Telephone Encounter (Signed)
Spoke with Kenney Houseman and informed her of the orders below.  Kenney Houseman stated the pt is taking Lasix 40mg  twice a day and has 3+ edema and she is concerned about him.  Also stated the pt is urinating often and he has been in a recliner with his feet in a downward position.  Left a message for the pts daughter to return my call regarding the questions below and sent to PCP as FYI.

## 2019-12-22 NOTE — Progress Notes (Deleted)
Cardiology Clinic Note   Patient Name: [name] Date of Encounter: 12/22/2019  Primary Care Provider:  Caren Macadam, MD Primary Cardiologist:  Kirk Ruths, MD  Patient Profile    [name]. [place] 86 year old male presents today for follow-up of his hypertension, history of MI, atrial fibrillation, and systolic heart failure.  Past Medical History    Past Medical History:  Diagnosis Date  . Anemia   . Atrial fibrillation ([place])   . BPH (benign prostatic hyperplasia)   . Bradycardia   . Cardiomyopathy   . CHF (congestive heart failure) ([place])   . Diabetes mellitus type II   . GERD (gastroesophageal reflux disease)   . History of colonoscopy   . HTN (hypertension)   . Hyperlipidemia   . ICD (implantable cardiac defibrillator) in place 10/14/2012   biventricular  . OA (osteoarthritis)   . Obesity   . Peptic ulcer    Past Surgical History:  Procedure Laterality Date  . EP IMPLANTABLE DEVICE Left   . EP IMPLANTABLE DEVICE N/A 07/27/2016   Procedure: BIV Pacemaker downgrade;  Surgeon: Evans Lance, MD;  Location: [place] [facility];  Service: Cardiovascular;  Laterality: N/A;  . ESOPHAGOGASTRODUODENOSCOPY  06/24/2002  . L-spine  1965  . Lumbar L4-5 & S1  02/2000  . TOTAL KNEE ARTHROPLASTY  1997   right  . TOTAL KNEE ARTHROPLASTY Left 02/15/2015   Procedure: LEFT TOTAL KNEE ARTHROPLASTY;  Surgeon: Paralee Cancel, MD;  Location: [facility];  Service: Orthopedics;  Laterality: Left;    Allergies  No Known Allergies  History of Present Illness  [name] has past medical history of normal coronary arteries 2006 LHC, CHF, nonischemic cardiomyopathy, status post biventricular ICD in 2015 which was downgraded to biventricular PPM 07/2016.  He also has a history of atrial fibrillation on Eliquis, hypertension, edema, diabetes mellitus type 2, and BPH.  He was recently admitted to the hospital from 12/11/2019-12/18/2019.  During that time cardiology was  consulted due to patient's CHF.  He presented to [facility] emergency department on 12/11/2019 with increased shortness of breath.  In the emergency department he was tachycardic and tachypneic.  His EKG showed ventricular pacing with underlying rapid atrial fibrillation.  His high-sensitivity troponin minimally elevated and flat at 41-42 his BNP is elevated at 1035.  He was given IV diuresis and started on low-dose lisinopril along with carvedilol.  He is not a candidate for Entresto due to orthostasis.  Patient contacted about upcoming follow-up clinic appointment 12/22/2019 and patient's daughter indicated that  her father's blood pressure was 82/60.  She also indicated that his swelling started to return to his feet and ankles.  His lisinopril and carvedilol were discontinued at that time.  He presents the clinic today for follow-up and states***  *** denies chest pain, shortness of breath, lower extremity edema, fatigue, palpitations, melena, hematuria, hemoptysis, diaphoresis, weakness, presyncope, syncope, orthopnea, and PND.   DC lisinopril and carvedilol, check K and review renal function Home Medications    Prior to Admission medications   Medication Sig Start Date End Date Taking? Authorizing Provider  acetaminophen (TYLENOL) 650 MG CR tablet Take 1,300 mg by mouth at bedtime as needed for pain.     [provider]  allopurinol (ZYLOPRIM) 100 MG tablet TAKE 1 TABLET EVERY DAY Patient taking differently: Take 100 mg by mouth daily after breakfast.  10/27/19   Koberlein, Steele Berg, MD  apixaban (ELIQUIS) 5 MG TABS tablet TAKE 1 TABLET(5 MG) BY  MOUTH TWICE DAILY Patient taking differently: Take 5 mg by mouth 2 (two) times daily after a meal.  09/02/19   Koberlein, Junell C, MD  atorvastatin (LIPITOR) 20 MG tablet TAKE 1 TABLET EVERY DAY Patient taking differently: Take 20 mg by mouth daily after supper.  08/25/19   Lewayne Bunting, MD  cholecalciferol (VITAMIN D) 1000 units tablet  Take 1,000 Units by mouth daily after breakfast.     [provider]  Docusate Calcium (STOOL SOFTENER PO) Take 2 capsules by mouth daily after supper.    [provider]  donepezil (ARICEPT) 5 MG tablet TAKE 1 TABLET  BY MOUTH AT BEDTIME. Patient taking differently: Take 5 mg by mouth at bedtime.  12/07/19   Wynn Banker, MD  furosemide (LASIX) 40 MG tablet Take 2 tablets (80 mg total) by mouth daily. 12/19/19   Zannie Cove, MD  magnesium oxide (MAG-OX) 400 (241.3 Mg) MG tablet TAKE 1 TABLET (400 MG TOTAL) BY MOUTH 2 (TWO) TIMES DAILY. Patient taking differently: Take 400 mg by mouth See admin instructions. Take one tablet (400 mg) by mouth twice daily - after breakfast and at 2pm 08/26/18   Lewayne Bunting, MD  MELATONIN ER PO Take 1 tablet by mouth at bedtime.    [provider]  tamsulosin (FLOMAX) 0.4 MG CAPS capsule TAKE 1 CAPSULE TWICE DAILY Patient taking differently: Take 0.4 mg by mouth 2 (two) times daily after a meal.  11/20/19   Koberlein, Paris Lore, MD    Family History    Family History  Problem Relation Age of Onset  . Heart disease Father   . Heart disease Mother   . Diabetes Mother   . Hypertension Brother   . Hypertension Brother   . Cancer Neg Hx    He indicated that his mother is deceased. He indicated that his father is deceased. He indicated that his maternal grandmother is deceased. He indicated that his maternal grandfather is deceased. He indicated that his paternal grandmother is deceased. He indicated that his paternal grandfather is deceased. He indicated that the status of his neg hx is unknown.  Social History    Social History   Socioeconomic History  . Marital status: Widowed    Spouse name: Not on file  . Number of children: Not on file  . Years of education: Not on file  . Highest education level: Not on file  Occupational History  . Occupation: Retired  Tobacco Use  . Smoking status: Never Smoker  .  Smokeless tobacco: Never Used  Substance and Sexual Activity  . Alcohol use: No  . Drug use: No  . Sexual activity: Not on file  Other Topics Concern  . Not on file  Social History Narrative  . Not on file   Social Determinants of Health   Financial Resource Strain:   . Difficulty of Paying Living Expenses: Not on file  Food Insecurity:   . Worried About Programme researcher, broadcasting/film/video in the Last Year: Not on file  . Ran Out of Food in the Last Year: Not on file  Transportation Needs:   . Lack of Transportation (Medical): Not on file  . Lack of Transportation (Non-Medical): Not on file  Physical Activity:   . Days of Exercise per Week: Not on file  . Minutes of Exercise per Session: Not on file  Stress:   . Feeling of Stress : Not on file  Social Connections:   . Frequency of Communication with  Friends and Family: Not on file  . Frequency of Social Gatherings with Friends and Family: Not on file  . Attends Religious Services: Not on file  . Active Member of Clubs or Organizations: Not on file  . Attends Banker Meetings: Not on file  . Marital Status: Not on file  Intimate Partner Violence:   . Fear of Current or Ex-Partner: Not on file  . Emotionally Abused: Not on file  . Physically Abused: Not on file  . Sexually Abused: Not on file     Review of Systems    General:  No chills, fever, night sweats or weight changes.  Cardiovascular:  No chest pain, dyspnea on exertion, edema, orthopnea, palpitations, paroxysmal nocturnal dyspnea. Dermatological: No rash, lesions/masses Respiratory: No cough, dyspnea Urologic: No hematuria, dysuria Abdominal:   No nausea, vomiting, diarrhea, bright red blood per rectum, melena, or hematemesis Neurologic:  No visual changes, wkns, changes in mental status. All other systems reviewed and are otherwise negative except as noted above.  Physical Exam    VS:  There were no vitals taken for this visit. , BMI There is no height or  weight on file to calculate BMI. GEN: Well nourished, well developed, in no acute distress. HEENT: normal. Neck: Supple, no JVD, carotid bruits, or masses. Cardiac: RRR, no murmurs, rubs, or gallops. No clubbing, cyanosis, edema.  Radials/DP/PT 2+ and equal bilaterally.  Respiratory:  Respirations regular and unlabored, clear to auscultation bilaterally. GI: Soft, nontender, nondistended, BS + x 4. MS: no deformity or atrophy. Skin: warm and dry, no rash. Neuro:  Strength and sensation are intact. Psych: Normal affect.  Accessory Clinical Findings    ECG personally reviewed by me today- *** - No acute changes  Echo 12/12/19 1. Left ventricular ejection fraction, by visual estimation, is 30 to 35%. The left ventricle has moderate to severely decreased function. There is no left ventricular hypertrophy. 2. Definity contrast agent was given IV to delineate the left ventricular endocardial borders. 3. Left ventricular diastolic function could not be evaluated. 4. The left ventricle demonstrates global hypokinesis. 5. Global right ventricle has normal systolic function.The right ventricular size is normal. No increase in right ventricular wall thickness. 6. Left atrial size was severely dilated. 7. Right atrial size was severely dilated. 8. The mitral valve is normal in structure. Mild to moderate mitral valve regurgitation. No evidence of mitral stenosis. 9. The tricuspid valve is normal in structure. 10. The aortic valve is normal in structure. Aortic valve regurgitation is not visualized. Mild to moderate aortic valve sclerosis/calcification without any evidence of aortic stenosis. 11. The pulmonic valve was normal in structure. Pulmonic valve regurgitation is trivial. 12. Aortic dilatation noted. 13. There is mild dilatation of the ascending aorta measuring 42 mm. 14. Compared with the echo 02/2018, the ascending aorta has increased from 3.8 cm to 4.2 cm. 15. Moderately elevated  pulmonary artery systolic pressure. 16. A pacer wire is visualized. 17. The inferior vena cava is normal in size with greater than 50% respiratory variability, suggesting right atrial pressure of 3 mmHg.  Assessment & Plan   1.  Acute combined systolic and diastolic heart failure-no increased work of breathing today patient appears euvolemic. Continue furosemide 80 mg daily  Nonischemic cardiomyopathy-echocardiogram 12/12/2019 EF 30 to 35% Hold carvedilol  Permanent atrial fibrillation-heart rate today continues to be regular***bpm Continue apixaban 5 mg twice daily Avoid triggers caffeine chocolate EtOH etc.  Hypotension-BP today*** Lower extremity support stockings Heart healthy low-sodium diet-salty 6  given Increase physical activity as tolerated Elevate extremities when not active Order BMP Blood pressure log given Stop lisinopril Hold carvedilol  Disposition: Follow-up with me in 1 week  Thomasene Ripple. Niemah Schwebke NP-C        [facility] [address] Office [phone] Fax [phone]

## 2019-12-22 NOTE — Telephone Encounter (Signed)
Follow up   Patients daughter [name] returned call. She also noted that her father is not doing well at all.

## 2019-12-22 NOTE — Telephone Encounter (Signed)
DC lisinopril and carvedilol.  Needs follow-up as scheduled.  Check potassium and renal function at time of follow-up. Olga Millers

## 2019-12-22 NOTE — Telephone Encounter (Signed)
Spoke with pt daughter, Aware of dr crenshaw's recommendations.  

## 2019-12-22 NOTE — Telephone Encounter (Signed)
I left a detailed message at the pts daughter's cell number with the information below.   

## 2019-12-23 ENCOUNTER — Telehealth: Payer: Self-pay | Admitting: Family Medicine

## 2019-12-23 ENCOUNTER — Encounter: Payer: Self-pay | Admitting: Family Medicine

## 2019-12-23 ENCOUNTER — Telehealth (INDEPENDENT_AMBULATORY_CARE_PROVIDER_SITE_OTHER): Payer: Medicare HMO | Admitting: Family Medicine

## 2019-12-23 ENCOUNTER — Other Ambulatory Visit: Payer: Self-pay

## 2019-12-23 ENCOUNTER — Encounter (HOSPITAL_COMMUNITY): Payer: Self-pay | Admitting: Emergency Medicine

## 2019-12-23 ENCOUNTER — Emergency Department (HOSPITAL_COMMUNITY): Payer: Medicare HMO

## 2019-12-23 ENCOUNTER — Telehealth: Payer: Self-pay | Admitting: *Deleted

## 2019-12-23 ENCOUNTER — Ambulatory Visit: Payer: Medicare HMO | Admitting: General Practice

## 2019-12-23 ENCOUNTER — Inpatient Hospital Stay (HOSPITAL_COMMUNITY)
Admission: EM | Admit: 2019-12-23 | Discharge: 2020-01-17 | DRG: 291 | Disposition: A | Discharge status: Hospice | Payer: Medicare HMO | Attending: Cardiology | Admitting: Cardiology

## 2019-12-23 VITALS — BP 100/60 | Temp 97.0°F

## 2019-12-23 DIAGNOSIS — N183 Chronic kidney disease, stage 3 unspecified: Secondary | ICD-10-CM | POA: Diagnosis present

## 2019-12-23 DIAGNOSIS — Z515 Encounter for palliative care: Secondary | ICD-10-CM

## 2019-12-23 DIAGNOSIS — I493 Ventricular premature depolarization: Secondary | ICD-10-CM | POA: Diagnosis present

## 2019-12-23 DIAGNOSIS — I4891 Unspecified atrial fibrillation: Secondary | ICD-10-CM | POA: Diagnosis present

## 2019-12-23 DIAGNOSIS — R531 Weakness: Secondary | ICD-10-CM

## 2019-12-23 DIAGNOSIS — R7989 Other specified abnormal findings of blood chemistry: Secondary | ICD-10-CM | POA: Diagnosis not present

## 2019-12-23 DIAGNOSIS — E669 Obesity, unspecified: Secondary | ICD-10-CM | POA: Diagnosis present

## 2019-12-23 DIAGNOSIS — I509 Heart failure, unspecified: Secondary | ICD-10-CM | POA: Diagnosis present

## 2019-12-23 DIAGNOSIS — E3400 Carcinoid syndrome, unspecified: Secondary | ICD-10-CM

## 2019-12-23 DIAGNOSIS — R110 Nausea: Secondary | ICD-10-CM

## 2019-12-23 DIAGNOSIS — I447 Left bundle-branch block, unspecified: Secondary | ICD-10-CM | POA: Diagnosis not present

## 2019-12-23 DIAGNOSIS — R000 Tachycardia, unspecified: Secondary | ICD-10-CM | POA: Diagnosis not present

## 2019-12-23 DIAGNOSIS — R0602 Shortness of breath: Secondary | ICD-10-CM

## 2019-12-23 DIAGNOSIS — I5043 Acute on chronic combined systolic (congestive) and diastolic (congestive) heart failure: Secondary | ICD-10-CM | POA: Diagnosis present

## 2019-12-23 DIAGNOSIS — Z66 Do not resuscitate: Secondary | ICD-10-CM

## 2019-12-23 DIAGNOSIS — I080 Rheumatic disorders of both mitral and aortic valves: Secondary | ICD-10-CM | POA: Diagnosis present

## 2019-12-23 DIAGNOSIS — Z96653 Presence of artificial knee joint, bilateral: Secondary | ICD-10-CM | POA: Diagnosis present

## 2019-12-23 DIAGNOSIS — Z95810 Presence of automatic (implantable) cardiac defibrillator: Secondary | ICD-10-CM | POA: Diagnosis present

## 2019-12-23 DIAGNOSIS — Z8711 Personal history of peptic ulcer disease: Secondary | ICD-10-CM

## 2019-12-23 DIAGNOSIS — Z20822 Contact with and (suspected) exposure to covid-19: Secondary | ICD-10-CM | POA: Diagnosis present

## 2019-12-23 DIAGNOSIS — E1122 Type 2 diabetes mellitus with diabetic chronic kidney disease: Secondary | ICD-10-CM | POA: Diagnosis present

## 2019-12-23 DIAGNOSIS — M109 Gout, unspecified: Secondary | ICD-10-CM | POA: Diagnosis present

## 2019-12-23 DIAGNOSIS — J90 Pleural effusion, not elsewhere classified: Secondary | ICD-10-CM | POA: Diagnosis not present

## 2019-12-23 DIAGNOSIS — R601 Generalized edema: Secondary | ICD-10-CM | POA: Diagnosis not present

## 2019-12-23 DIAGNOSIS — R570 Cardiogenic shock: Secondary | ICD-10-CM

## 2019-12-23 DIAGNOSIS — E1121 Type 2 diabetes mellitus with diabetic nephropathy: Secondary | ICD-10-CM | POA: Diagnosis not present

## 2019-12-23 DIAGNOSIS — N179 Acute kidney failure, unspecified: Secondary | ICD-10-CM | POA: Diagnosis not present

## 2019-12-23 DIAGNOSIS — I5023 Acute on chronic systolic (congestive) heart failure: Secondary | ICD-10-CM | POA: Diagnosis not present

## 2019-12-23 DIAGNOSIS — R0600 Dyspnea, unspecified: Secondary | ICD-10-CM

## 2019-12-23 DIAGNOSIS — Z79899 Other long term (current) drug therapy: Secondary | ICD-10-CM

## 2019-12-23 DIAGNOSIS — I878 Other specified disorders of veins: Secondary | ICD-10-CM | POA: Diagnosis present

## 2019-12-23 DIAGNOSIS — I4821 Permanent atrial fibrillation: Secondary | ICD-10-CM | POA: Diagnosis present

## 2019-12-23 DIAGNOSIS — Z452 Encounter for adjustment and management of vascular access device: Secondary | ICD-10-CM

## 2019-12-23 DIAGNOSIS — I130 Hypertensive heart and chronic kidney disease with heart failure and stage 1 through stage 4 chronic kidney disease, or unspecified chronic kidney disease: Secondary | ICD-10-CM | POA: Diagnosis present

## 2019-12-23 DIAGNOSIS — R609 Edema, unspecified: Secondary | ICD-10-CM

## 2019-12-23 DIAGNOSIS — E1129 Type 2 diabetes mellitus with other diabetic kidney complication: Secondary | ICD-10-CM | POA: Diagnosis not present

## 2019-12-23 DIAGNOSIS — K219 Gastro-esophageal reflux disease without esophagitis: Secondary | ICD-10-CM | POA: Diagnosis present

## 2019-12-23 DIAGNOSIS — M1990 Unspecified osteoarthritis, unspecified site: Secondary | ICD-10-CM | POA: Diagnosis present

## 2019-12-23 DIAGNOSIS — E875 Hyperkalemia: Secondary | ICD-10-CM

## 2019-12-23 DIAGNOSIS — R0682 Tachypnea, not elsewhere classified: Secondary | ICD-10-CM | POA: Diagnosis not present

## 2019-12-23 DIAGNOSIS — E871 Hypo-osmolality and hyponatremia: Secondary | ICD-10-CM

## 2019-12-23 DIAGNOSIS — I5022 Chronic systolic (congestive) heart failure: Secondary | ICD-10-CM

## 2019-12-23 DIAGNOSIS — I10 Essential (primary) hypertension: Secondary | ICD-10-CM | POA: Diagnosis not present

## 2019-12-23 DIAGNOSIS — E785 Hyperlipidemia, unspecified: Secondary | ICD-10-CM | POA: Diagnosis present

## 2019-12-23 DIAGNOSIS — Z8249 Family history of ischemic heart disease and other diseases of the circulatory system: Secondary | ICD-10-CM

## 2019-12-23 DIAGNOSIS — M1991 Primary osteoarthritis, unspecified site: Secondary | ICD-10-CM | POA: Diagnosis not present

## 2019-12-23 DIAGNOSIS — N400 Enlarged prostate without lower urinary tract symptoms: Secondary | ICD-10-CM | POA: Diagnosis present

## 2019-12-23 DIAGNOSIS — Z833 Family history of diabetes mellitus: Secondary | ICD-10-CM

## 2019-12-23 DIAGNOSIS — F0390 Unspecified dementia without behavioral disturbance: Secondary | ICD-10-CM | POA: Diagnosis present

## 2019-12-23 DIAGNOSIS — I428 Other cardiomyopathies: Secondary | ICD-10-CM

## 2019-12-23 DIAGNOSIS — I110 Hypertensive heart disease with heart failure: Secondary | ICD-10-CM | POA: Diagnosis not present

## 2019-12-23 DIAGNOSIS — Z7901 Long term (current) use of anticoagulants: Secondary | ICD-10-CM

## 2019-12-23 DIAGNOSIS — Z6834 Body mass index (BMI) 34.0-34.9, adult: Secondary | ICD-10-CM

## 2019-12-23 NOTE — ED Triage Notes (Addendum)
Pt was recently discharged from hosp with CHF.  St's tonight legs are swollen and weeping.  No resp distress at this time  Pt c/o shortness of breath on exertion

## 2019-12-23 NOTE — Telephone Encounter (Signed)
-----   Message from Wynn Banker, MD sent at 12/23/2019  3:18 PM EST ----- He is not doing well. Can you touch base with home health and see if they can get out there either today or tomorrow to get labwork? I would also like UA in addition to CMP that is ordered and please have them get this to lab stat so we can get results asap. I would also like nurse to go out and help with wrapping legs with ace wrap. They appear too swollen for compression stockings, but I think they could help with ace wraps. Please let me know timeline for them being able to get bloodwork. Thanks!

## 2019-12-23 NOTE — Telephone Encounter (Signed)
Home Health Verbal Orders - Caller/Agency: [facility] Callback Number: [phone] secure  Requesting OT/PT/Skilled Nursing/Social Work/Speech Therapy: PT Frequency: 2x for 4wks.

## 2019-12-23 NOTE — Progress Notes (Signed)
Virtual Visit via Video Note  I connected with [employer]  on 12/23/19 at  2:30 PM EST by a video enabled telemedicine application and verified that I am speaking with the correct person using two identifiers.  Location patient: home Location provider:work or home office Persons participating in the virtual visit: patient, provider  I discussed the limitations of evaluation and management by telemedicine and the availability of in person appointments. The patient expressed understanding and agreed to proceed.   [name] [place] DOB: 09-Dec-1929 Encounter date: 12/23/2019  This is a 86 y.o. male who presents with Chief Complaint  Patient presents with  . Hospitalization Follow-up    History of present illness: Hospital follow up/follow up labs: Admitted 12/10/20 and discharged 12/18/19  Acute on chronic systolic HF, pneumonia (abx completed). DM and dementia stable. 4.4 L negative at time of discharge. BMP on discharge with elevated BUN/creat 53/1.36, hyponatremia, hyperkalemia, hypochloremia, hypocalcemia with instructions to hold one day of lasix, and then resume and hold lisinopril and potassium until repeat bloodwork 1/11 (not yet obtained).   Was supposed to have appointment today with Dr. Jens Som, but daughter couldn't get him in there. Nurse spoke with them and they took away 2 of bp meds and so pressure is running a little better.   Swelling in legs and feet were great in hospital, but home 2 days and swelling again. Not eating sodium to cause this and having a lot of urination. Tries to keep feet elevated, but difficult during day because urinating so much. He is taking 80mg  in morning.   Potassium still held.   They have steps to get in and out of house 10 steep steps and 4 more to driveway. Legs are not where they need to be. PT came out today and he did well.   Hot flashes started back. No fevers at home.   Today bp was: 100/60 (yesterday was 80/60).    Breathing is "strange". When he gets hot flash in face (not as often) then he has abnormal breathing for awhile - harder breathing "almost like sleep apnea breathing". Once this passes he does pretty well. If getting up to move around is short of breath for a little while but still much better.   Weight today: 223 Weight on admission 220lb; weight on discharge was 229; repeated at home 229  Ankles are just about as swollen as they were when he went to hospital. Legs are starting to bush open again.   He is on 1L daily fluid restriction. Has always been able to drink as much as he wants. Was cut back 2 years ago by Dr. and they measure daily.   Nurse is coming out to do blood draw this week. Legs were looked at by nurse. Original assessment of legs Monday.   Sometimes urinates a lot and sometimes not. Still having good bowel movement. No pain with bowel movements. Sometimes with nausea.   Feels fine sitting. Just weak with standing. Not dizzy with standing.    No Known Allergies Current Meds  Medication Sig  . acetaminophen (TYLENOL) 650 MG CR tablet Take 1,300 mg by mouth at bedtime as needed for pain.   Tuesday allopurinol (ZYLOPRIM) 100 MG tablet TAKE 1 TABLET EVERY DAY (Patient taking differently: Take 100 mg by mouth daily after breakfast. )  . apixaban (ELIQUIS) 5 MG TABS tablet TAKE 1 TABLET(5 MG) BY MOUTH TWICE DAILY (Patient taking differently: Take 5 mg by mouth 2 (two) times daily  after a meal. )  . atorvastatin (LIPITOR) 20 MG tablet TAKE 1 TABLET EVERY DAY (Patient taking differently: Take 20 mg by mouth daily after supper. )  . cholecalciferol (VITAMIN D) 1000 units tablet Take 1,000 Units by mouth daily after breakfast.   . Docusate Calcium (STOOL SOFTENER PO) Take 2 capsules by mouth 2 (two) times daily.   [name] donepezil (ARICEPT) 5 MG tablet TAKE 1 TABLET  BY MOUTH AT BEDTIME. (Patient taking differently: Take 5 mg by mouth at bedtime. )  . furosemide (LASIX) 40 MG  tablet Take 2 tablets (80 mg total) by mouth daily.  . magnesium oxide (MAG-OX) 400 (241.3 Mg) MG tablet TAKE 1 TABLET (400 MG TOTAL) BY MOUTH 2 (TWO) TIMES DAILY. (Patient taking differently: Take 400 mg by mouth See admin instructions. Take one tablet (400 mg) by mouth twice daily - after breakfast and at 2pm)  . MELATONIN ER PO Take 1 tablet by mouth at bedtime.  . tamsulosin (FLOMAX) 0.4 MG CAPS capsule TAKE 1 CAPSULE TWICE DAILY (Patient taking differently: Take 0.4 mg by mouth 2 (two) times daily after a meal. )    Review of Systems  Constitutional: Positive for activity change and fatigue. Negative for appetite change, chills, diaphoresis and fever.  HENT: Negative for congestion.   Respiratory: Positive for shortness of breath. Negative for cough and chest tightness.   Cardiovascular: Positive for leg swelling. Negative for chest pain.  Gastrointestinal: Positive for nausea. Negative for abdominal pain, constipation and diarrhea.  Genitourinary: Positive for frequency. Negative for difficulty urinating.  Musculoskeletal: Positive for gait problem.  Skin: Positive for color change (flushing intermittent). Negative for rash.  Neurological: Positive for weakness (lower extrem). Negative for dizziness and light-headedness.  Psychiatric/Behavioral: Negative for confusion. The patient is nervous/anxious.     Objective:  BP 100/60   Temp (!) 97 F (36.1 C)       BP Readings from Last 3 Encounters:  12/23/19 100/60  12/18/19 113/80  12/07/19 (!) 122/58   Wt Readings from Last 3 Encounters:  12/18/19 224 lb 10.4 oz (101.9 kg)  12/07/19 225 lb 9.6 oz (102.3 kg)  08/03/19 215 lb (97.5 kg)    EXAM:  GENERAL: alert, oriented, but tired. No acute distress, but sitting with eyes closed intermittently during exam.  HEENT: atraumatic, conjunctiva clear, no obvious abnormalities on inspection of external nose and ears  NECK: normal movements of the head and neck  LUNGS: on  inspection no signs of respiratory distress, breathing rate appears normal, no obvious gross SOB, gasping or wheezing  CV: no obvious cyanosis. He has notable edema bilat LE; there is early blistering with clear vesicles starting.   PSYCH/NEURO: pleasant and cooperative, no obvious depression or anxiety, speech and thought processing grossly intact   Assessment/Plan  1. Carcinoid syndrome ([place]) There is concern with elevated 5 HIAA and urine lots of the symptoms may be related to carcinoid.  He continues to have issues with flushing episodes associated with acute dyspnea.  I feel that further imaging is warranted to get a better idea of what we are dealing with.  He is too weak currently for his daughter to get him out of the house which involves going down multiple stairs, so she is hopeful that with some physical therapy and work through the rest of the week and weekend that she will be able to get him out of the house next week.  We have specifically discussed that if any worsening of current condition  he should proceed to the ER for further evaluation. - CT Chest W Contrast; Future - CT Abdomen Pelvis W Contrast; Future  2. Acute on chronic combined systolic and diastolic CHF (congestive heart failure) (HCC) He has significant edema right now.  My concern is that his kidney function was strained on his discharge blood work.  You were able to get in touch with home health and they will draw his blood tomorrow to get a stat CMP.  He may need a more potent diuretic, but I am worried as well about the dehydration that is seen on his last BMP.  Additionally, he had hyponatremia, hypochloremia, and hyperkalemia so we will need to address the best way to manage all of the above.  3. Edema, unspecified type See above.  There is difficulty with getting his legs above heart level since he does not have a hospital bed and is sleeping in a recliner (which daughter states will not change).  I have asked  home nursing to try and Ace wrap his lower extremities to help keep fluid down in for him to try and keep his legs up whenever possible.  He does seem to be having good diuresis with the Lasix, but we may need to adjust this pending blood work.  4. Nausea Not significant and not keeping him from eating currently.  5. Dyspnea, unspecified type Shortness of breath is better than it was when he was first hospitalized, but he still has episodes intermittently and associated with flushing.  6. Elevated serum creatinine See above.  7. Controlled type 2 diabetes mellitus with diabetic nephropathy, without long-term current use of insulin (HCC) Sugars have been stable through this.  8. Hyperkalemia See above.  9. Hyponatremia .  See above  10. Weakness He has home health coming out to work with him and physical therapy regularly.  He is motivated to get lower extremity stronger so that he is able to walk independently.   Return for Pending blood work. I am hopeful that we will get blood work results back by Friday, but if we do not I will touch base with them to see how he is doing before we had into the weekend.  I am very concerned about balancing his fluid overload with renal function.  On a positive note, cardiology's recommendation to hold his blood pressure medications as helped keep his pressures a little higher but I have instructed the daughter to keep checking on a regular basis.  Further evaluation pending lab results.  I discussed the assessment and treatment plan with the patient. The patient was provided an opportunity to ask questions and all were answered. The patient agreed with the plan and demonstrated an understanding of the instructions.   The patient was advised to call back or seek an in-person evaluation if the symptoms worsen or if the condition fails to improve as anticipated.  I provided 32 minutes of non-face-to-face time during this encounter.  In total time with  discharge summary review, lab review, lab orders and imaging orders, note completion is over 45 minutes.   Theodis Shove, MD

## 2019-12-23 NOTE — Telephone Encounter (Signed)
Message Routed to PCP CMA 

## 2019-12-23 NOTE — Telephone Encounter (Signed)
I called [facility] at [phone] and spoke with [place].  [place] stated they are scheduled to go to the pts home tomorrow and labs can be done at that time.  She transferred me to the nurse manager Sherilyn Dacosta to give orders as below.  I left a detailed message on Margaret's voicemail to add the UA and order for ace wraps as below and Victorino Dike the receptionist stated she will remind Claris Che that a message was left also.  Message sent to PCP as FYI.

## 2019-12-23 NOTE — Telephone Encounter (Signed)
Noted. Mychart message sent regarding plan.

## 2019-12-24 ENCOUNTER — Encounter: Payer: Self-pay | Admitting: Family Medicine

## 2019-12-24 ENCOUNTER — Telehealth: Payer: Self-pay | Admitting: *Deleted

## 2019-12-24 ENCOUNTER — Telehealth: Payer: Self-pay | Admitting: Cardiology

## 2019-12-24 DIAGNOSIS — I5043 Acute on chronic combined systolic (congestive) and diastolic (congestive) heart failure: Secondary | ICD-10-CM | POA: Diagnosis present

## 2019-12-24 DIAGNOSIS — I428 Other cardiomyopathies: Secondary | ICD-10-CM

## 2019-12-24 DIAGNOSIS — R601 Generalized edema: Secondary | ICD-10-CM | POA: Diagnosis not present

## 2019-12-24 DIAGNOSIS — K219 Gastro-esophageal reflux disease without esophagitis: Secondary | ICD-10-CM | POA: Diagnosis present

## 2019-12-24 DIAGNOSIS — R000 Tachycardia, unspecified: Secondary | ICD-10-CM | POA: Diagnosis not present

## 2019-12-24 DIAGNOSIS — I878 Other specified disorders of veins: Secondary | ICD-10-CM | POA: Diagnosis present

## 2019-12-24 DIAGNOSIS — I10 Essential (primary) hypertension: Secondary | ICD-10-CM

## 2019-12-24 DIAGNOSIS — M109 Gout, unspecified: Secondary | ICD-10-CM | POA: Diagnosis present

## 2019-12-24 DIAGNOSIS — R0682 Tachypnea, not elsewhere classified: Secondary | ICD-10-CM | POA: Diagnosis not present

## 2019-12-24 DIAGNOSIS — I130 Hypertensive heart and chronic kidney disease with heart failure and stage 1 through stage 4 chronic kidney disease, or unspecified chronic kidney disease: Secondary | ICD-10-CM | POA: Diagnosis present

## 2019-12-24 DIAGNOSIS — Z20822 Contact with and (suspected) exposure to covid-19: Secondary | ICD-10-CM | POA: Diagnosis present

## 2019-12-24 DIAGNOSIS — I5023 Acute on chronic systolic (congestive) heart failure: Secondary | ICD-10-CM | POA: Diagnosis not present

## 2019-12-24 DIAGNOSIS — I509 Heart failure, unspecified: Secondary | ICD-10-CM

## 2019-12-24 DIAGNOSIS — E1121 Type 2 diabetes mellitus with diabetic nephropathy: Secondary | ICD-10-CM | POA: Diagnosis not present

## 2019-12-24 DIAGNOSIS — Z96653 Presence of artificial knee joint, bilateral: Secondary | ICD-10-CM | POA: Diagnosis present

## 2019-12-24 DIAGNOSIS — E871 Hypo-osmolality and hyponatremia: Secondary | ICD-10-CM | POA: Diagnosis present

## 2019-12-24 DIAGNOSIS — E785 Hyperlipidemia, unspecified: Secondary | ICD-10-CM | POA: Diagnosis present

## 2019-12-24 DIAGNOSIS — R531 Weakness: Secondary | ICD-10-CM | POA: Diagnosis not present

## 2019-12-24 DIAGNOSIS — M1990 Unspecified osteoarthritis, unspecified site: Secondary | ICD-10-CM | POA: Diagnosis present

## 2019-12-24 DIAGNOSIS — N179 Acute kidney failure, unspecified: Secondary | ICD-10-CM | POA: Diagnosis not present

## 2019-12-24 DIAGNOSIS — Z515 Encounter for palliative care: Secondary | ICD-10-CM | POA: Diagnosis present

## 2019-12-24 DIAGNOSIS — N400 Enlarged prostate without lower urinary tract symptoms: Secondary | ICD-10-CM | POA: Diagnosis present

## 2019-12-24 DIAGNOSIS — N183 Chronic kidney disease, stage 3 unspecified: Secondary | ICD-10-CM | POA: Diagnosis present

## 2019-12-24 DIAGNOSIS — E1122 Type 2 diabetes mellitus with diabetic chronic kidney disease: Secondary | ICD-10-CM | POA: Diagnosis present

## 2019-12-24 DIAGNOSIS — R570 Cardiogenic shock: Secondary | ICD-10-CM | POA: Diagnosis not present

## 2019-12-24 DIAGNOSIS — F0390 Unspecified dementia without behavioral disturbance: Secondary | ICD-10-CM | POA: Diagnosis present

## 2019-12-24 DIAGNOSIS — I4821 Permanent atrial fibrillation: Secondary | ICD-10-CM

## 2019-12-24 DIAGNOSIS — I080 Rheumatic disorders of both mitral and aortic valves: Secondary | ICD-10-CM | POA: Diagnosis present

## 2019-12-24 DIAGNOSIS — I493 Ventricular premature depolarization: Secondary | ICD-10-CM | POA: Diagnosis present

## 2019-12-24 DIAGNOSIS — Z66 Do not resuscitate: Secondary | ICD-10-CM | POA: Diagnosis present

## 2019-12-24 DIAGNOSIS — Z95810 Presence of automatic (implantable) cardiac defibrillator: Secondary | ICD-10-CM | POA: Diagnosis not present

## 2019-12-24 LAB — BRAIN NATRIURETIC PEPTIDE: B Natriuretic Peptide: 788.7 pg/mL — ABNORMAL HIGH (ref 0.0–100.0)

## 2019-12-24 LAB — CBC
HCT: 41.5 % (ref 39.0–52.0)
Hemoglobin: 13.6 g/dL (ref 13.0–17.0)
MCH: 31 pg (ref 26.0–34.0)
MCHC: 32.8 g/dL (ref 30.0–36.0)
MCV: 94.5 fL (ref 80.0–100.0)
Platelets: 135 10*3/uL — ABNORMAL LOW (ref 150–400)
RBC: 4.39 MIL/uL (ref 4.22–5.81)
RDW: 14.4 % (ref 11.5–15.5)
WBC: 8 10*3/uL (ref 4.0–10.5)
nRBC: 0 % (ref 0.0–0.2)

## 2019-12-24 LAB — CREATININE, SERUM
Creatinine, Ser: 1.02 mg/dL (ref 0.61–1.24)
GFR calc Af Amer: >60 mL/min (ref >60)
GFR calc non Af Amer: >60 mL/min (ref >60)

## 2019-12-24 LAB — CBC WITH DIFFERENTIAL/PLATELET
Abs Immature Granulocytes: 0.03 10*3/uL (ref 0.00–0.07)
Basophils Absolute: 0 10*3/uL (ref 0.0–0.1)
Basophils Relative: 0 %
Eosinophils Absolute: 0.1 10*3/uL (ref 0.0–0.5)
Eosinophils Relative: 1 %
HCT: 41.8 % (ref 39.0–52.0)
Hemoglobin: 13.5 g/dL (ref 13.0–17.0)
Immature Granulocytes: 0 %
Lymphocytes Relative: 15 %
Lymphs Abs: 1.4 10*3/uL (ref 0.7–4.0)
MCH: 31.3 pg (ref 26.0–34.0)
MCHC: 32.3 g/dL (ref 30.0–36.0)
MCV: 96.8 fL (ref 80.0–100.0)
Monocytes Absolute: 0.8 10*3/uL (ref 0.1–1.0)
Monocytes Relative: 9 %
Neutro Abs: 6.8 10*3/uL (ref 1.7–7.7)
Neutrophils Relative %: 75 %
Platelets: 136 10*3/uL — ABNORMAL LOW (ref 150–400)
RBC: 4.32 MIL/uL (ref 4.22–5.81)
RDW: 14.5 % (ref 11.5–15.5)
WBC: 9.1 10*3/uL (ref 4.0–10.5)
nRBC: 0 % (ref 0.0–0.2)

## 2019-12-24 LAB — COMPREHENSIVE METABOLIC PANEL
ALT: 43 U/L (ref 0–44)
AST: 41 U/L (ref 15–41)
Albumin: 3.2 g/dL — ABNORMAL LOW (ref 3.5–5.0)
Alkaline Phosphatase: 87 U/L (ref 38–126)
Anion gap: 7 (ref 5–15)
BUN: 36 mg/dL — ABNORMAL HIGH (ref 8–23)
CO2: 32 mmol/L (ref 22–32)
Calcium: 8.8 mg/dL — ABNORMAL LOW (ref 8.9–10.3)
Chloride: 99 mmol/L (ref 98–111)
Creatinine, Ser: 1.33 mg/dL — ABNORMAL HIGH (ref 0.61–1.24)
GFR calc Af Amer: 55 mL/min — ABNORMAL LOW (ref >60)
GFR calc non Af Amer: 47 mL/min — ABNORMAL LOW (ref >60)
Glucose, Bld: 158 mg/dL — ABNORMAL HIGH (ref 70–99)
Potassium: 3.8 mmol/L (ref 3.5–5.1)
Sodium: 138 mmol/L (ref 135–145)
Total Bilirubin: 1.1 mg/dL (ref 0.3–1.2)
Total Protein: 5.7 g/dL — ABNORMAL LOW (ref 6.5–8.1)

## 2019-12-24 LAB — CBG MONITORING, ED: Glucose-Capillary: 126 mg/dL — ABNORMAL HIGH (ref 70–99)

## 2019-12-24 LAB — RESPIRATORY PANEL BY RT PCR (FLU A&B, COVID)
Influenza A by PCR: NEGATIVE
Influenza B by PCR: NEGATIVE
SARS Coronavirus 2 by RT PCR: NEGATIVE

## 2019-12-24 LAB — MAGNESIUM: Magnesium: 2 mg/dL (ref 1.7–2.4)

## 2019-12-24 MED ORDER — APIXABAN 5 MG PO TABS
5.0000 mg | ORAL_TABLET | Freq: Two times a day (BID) | ORAL | Status: DC
  Administered 2019-12-24 – 2020-01-07 (×29): 5 mg via ORAL
  Filled 2019-12-24 (×30): qty 1

## 2019-12-24 MED ORDER — ACETAMINOPHEN 325 MG PO TABS
650.0000 mg | ORAL_TABLET | ORAL | Status: DC | PRN
  Administered 2019-12-26 – 2020-01-11 (×3): 650 mg via ORAL
  Filled 2019-12-24 (×3): qty 2

## 2019-12-24 MED ORDER — SODIUM CHLORIDE 0.9% FLUSH: 3.0000 mL | INTRAVENOUS | Status: DC | PRN

## 2019-12-24 MED ORDER — FUROSEMIDE 10 MG/ML IJ SOLN
40.0000 mg | Freq: Once | INTRAMUSCULAR | Status: AC
  Administered 2019-12-24: 40 mg via INTRAVENOUS
  Filled 2019-12-24: qty 4

## 2019-12-24 MED ORDER — SODIUM CHLORIDE 0.9 % IV SOLN: 250.0000 mL | INTRAVENOUS | Status: DC | PRN

## 2019-12-24 MED ORDER — SODIUM CHLORIDE 0.9% FLUSH
3.0000 mL | Freq: Two times a day (BID) | INTRAVENOUS | Status: DC
  Administered 2019-12-24 – 2020-01-06 (×11): 3 mL via INTRAVENOUS

## 2019-12-24 MED ORDER — DONEPEZIL HCL 5 MG PO TABS
5.0000 mg | ORAL_TABLET | Freq: Every day | ORAL | Status: DC
  Administered 2019-12-24 – 2020-01-16 (×24): 5 mg via ORAL
  Filled 2019-12-24 (×27): qty 1

## 2019-12-24 MED ORDER — ONDANSETRON HCL 4 MG/2ML IJ SOLN: 4.0000 mg | Freq: Four times a day (QID) | INTRAMUSCULAR | Status: DC | PRN

## 2019-12-24 MED ORDER — ACETAMINOPHEN 325 MG PO TABS: 650.0000 mg | ORAL_TABLET | ORAL | Status: DC | PRN

## 2019-12-24 MED ORDER — MAGNESIUM OXIDE 400 (241.3 MG) MG PO TABS
400.0000 mg | ORAL_TABLET | Freq: Two times a day (BID) | ORAL | Status: DC
  Administered 2019-12-24 – 2019-12-28 (×10): 400 mg via ORAL
  Filled 2019-12-24 (×10): qty 1

## 2019-12-24 MED ORDER — TAMSULOSIN HCL 0.4 MG PO CAPS
0.4000 mg | ORAL_CAPSULE | Freq: Two times a day (BID) | ORAL | Status: DC
  Administered 2019-12-24 – 2020-01-17 (×48): 0.4 mg via ORAL
  Filled 2019-12-24 (×48): qty 1

## 2019-12-24 MED ORDER — ALLOPURINOL 100 MG PO TABS
100.0000 mg | ORAL_TABLET | Freq: Every day | ORAL | Status: DC
  Administered 2019-12-24 – 2020-01-17 (×25): 100 mg via ORAL
  Filled 2019-12-24 (×25): qty 1

## 2019-12-24 MED ORDER — SODIUM CHLORIDE 0.9% FLUSH
3.0000 mL | Freq: Two times a day (BID) | INTRAVENOUS | Status: DC
  Administered 2019-12-24 – 2020-01-06 (×21): 3 mL via INTRAVENOUS

## 2019-12-24 MED ORDER — MELATONIN 3 MG PO TABS
6.0000 mg | ORAL_TABLET | Freq: Every day | ORAL | Status: DC
  Administered 2019-12-24 – 2020-01-12 (×20): 6 mg via ORAL
  Filled 2019-12-24 (×23): qty 2

## 2019-12-24 MED ORDER — HEPARIN SODIUM (PORCINE) 5000 UNIT/ML IJ SOLN: 5000.0000 [IU] | Freq: Three times a day (TID) | INTRAMUSCULAR | Status: DC

## 2019-12-24 MED ORDER — ONDANSETRON HCL 4 MG/2ML IJ SOLN
4.0000 mg | Freq: Four times a day (QID) | INTRAMUSCULAR | Status: DC | PRN
  Administered 2019-12-26: 4 mg via INTRAVENOUS
  Filled 2019-12-24: qty 2

## 2019-12-24 MED ORDER — FUROSEMIDE 10 MG/ML IJ SOLN
40.0000 mg | Freq: Two times a day (BID) | INTRAMUSCULAR | Status: DC
  Administered 2019-12-24 – 2019-12-26 (×5): 40 mg via INTRAVENOUS
  Filled 2019-12-24 (×6): qty 4

## 2019-12-24 MED ORDER — ATORVASTATIN CALCIUM 10 MG PO TABS
20.0000 mg | ORAL_TABLET | Freq: Every day | ORAL | Status: DC
  Administered 2019-12-24 – 2020-01-16 (×23): 20 mg via ORAL
  Filled 2019-12-24 (×23): qty 2

## 2019-12-24 NOTE — ED Notes (Signed)
covid swab collected and labeled with 2 identifiers Lasix given per MAR. Name/DOB verified.  Urinal provided at bedside

## 2019-12-24 NOTE — ED Notes (Signed)
Daughter updated 

## 2019-12-24 NOTE — Telephone Encounter (Signed)
Patient's daughter calling to update on the patient stating he went to the hospital yesterday 1/13 because his legs busted open and were pouring liquid and he was experiencing SOB. She states he is still in emergency triage and is still very weak.

## 2019-12-24 NOTE — H&P (Addendum)
History & Physical    Patient ID: Nirvan Laban MRN: [id number], DOB/AGE: May 08, 1929   Admit date: 12/23/2019 Primary Physician: Wynn Banker, MD Primary Cardiologist: Dr. Olga Millers, MD  Patient Profile   [facility] is a 86 y.o. male with a history of normal coronaries on cardiac catheterization in 2006, chronic systolic CHF/non-ischemic cardiomyopathy with EF of 30-35% on echo in 12/2019, s/p BiV ICD placement in 10/2012 which was downgraded to BiV PPM in 07/2016, permanent atrial fibrillation on Eliquis, hypertension, hyperlipidemia, type 2 diabetes mellitus, BPH, who is being admitted to Cardiology service today for CHF.   Past Medical History    Past Medical History:  Diagnosis Date   Anemia    Atrial fibrillation (HCC)    BPH (benign prostatic hyperplasia)    Bradycardia    Cardiomyopathy    CHF (congestive heart failure) (HCC)    Diabetes mellitus type II    GERD (gastroesophageal reflux disease)    History of colonoscopy    HTN (hypertension)    Hyperlipidemia    ICD (implantable cardiac defibrillator) in place 10/14/2012   biventricular   OA (osteoarthritis)    Obesity    Peptic ulcer     Past Surgical History:  Procedure Laterality Date   EP IMPLANTABLE DEVICE Left    EP IMPLANTABLE DEVICE N/A 07/27/2016   Procedure: BIV Pacemaker downgrade;  Surgeon: Marinus Maw, MD;  Location: [facility];  Service: Cardiovascular;  Laterality: N/A;   ESOPHAGOGASTRODUODENOSCOPY  06/24/2002   L-spine  1965   Lumbar L4-5 & S1  02/2000   TOTAL KNEE ARTHROPLASTY  1997   right   TOTAL KNEE ARTHROPLASTY Left 02/15/2015   Procedure: LEFT TOTAL KNEE ARTHROPLASTY;  Surgeon: Durene Romans, MD;  Location: [facility];  Service: Orthopedics;  Laterality: Left;    Allergies  No Known Allergies  History of Present Illness    Mr. Ransford has a history stated above who is typically followed by Dr. Jens Som and Dr. Ladona Ridgel. Echocardiogram from 02/2017  showed LVEF of 40 to 45%, moderate LVH, moderate LV enlargement, severe biatrial enlargement and mild to moderate MR.  He was last seen by Dr. Jens Som 07/2019 at which time he was doing well from a CV standpoint.  He noted chronic pedal edema which was stable on Lasix 80 mg daily and denied chest pain, dyspnea, palpitations or syncope.  No medication changes were made and he was advised to follow-up in 6 months.  He then presented to [facility], ED on 12/11/2019 for worsening shortness of breath.  In the ED, patient was tachycardic and tachypneic. EKG showed V pacing with underlying rapid atrial fibrillation.  High-sensitivity troponin was minimally elevated and flat. BNP at that time was 1035. CXR showed hazy opacity within the right mid and lower lung field suspicious for pneumonia and bilateral pleural effusions.  Repeat echocardiogram showed LVEF at 30 to 35% (down from 40 to 45% 32,018) with global hypokinesis, severe biatrial enlargement, mild to moderate MR and mild dilatation of the ascending aorta measuring 42 mm. Cardiology was consulted to assist with CHF exacerbation.   He was diuresed approximately 4.4 L during last hospital admission.  At hospital discharge, reinitiation of home dose of Lasix was recommended>> to be resumed 12/19/2019.  At hospital discharge, he appeared euvolemic and was weaned off oxygen supplementation.  Low-dose carvedilol was added to his medication regimen for greater rate control for his permanent atrial fibrillation.  Hospital discharge medication regimen included resuming  80 mg p.o. daily 1 day after hospital discharge with instructions to take an additional 40 mg daily for weight gain of 2 to 3 pounds in 1 day.  His lisinopril was to be resumed on day after discharge as well.  Patient was to have close follow-up scheduled for 12/23/2019 however this was canceled given his ED admission. D/C weight 223lb.   Per chart review, it appears the patient's daughter called the office  given worsening lower extremity swelling and shortness of breath on 12/23/2019.  Patient reports he has been doing very well since hospital discharge.  He reports that he lives alone however his daughter lives very close by and assists him with medications, cooking and other household chores.  He states that on day of presentation, PT was at his home and working with him at which time he was ambulating without shortness of breath and was feeling very well.  After PT left his home, he states he went to the bathroom and his right lower extremity began weeping profusely and he began having significant shortness of breath.  He does state that approximately 1 to 2 days prior he had been more fatigued but overall felt well.  Given his acute symptoms, he called his primary care physician who recommended that he come to the hospital for further assessment.  He reports he has been taking his Lasix 40 mg twice daily at last hospital discharge 12/18/2019.  He states he weighs himself daily with no acute increases, weight this morning 223lb which appears to be his hospital discharge weight from 12/18/2019.  In the ED, BNP found to be elevated at 788.  Creatinine is elevated at 1.33 above baseline of 1.0.  Covid screening was negative.  CXR with overall improvement from previous imaging with small bilateral pleural effusions and bibasilar atelectasis/infiltrate and stable cardiomegaly.  Weight in the emergency room documented at 225lb.  He was started on IV Lasix 40 mg twice daily for diuresis.  Home Eliquis was restarted as well.  Home Medications    Prior to Admission medications   Medication Sig Start Date End Date Taking? Authorizing Provider  acetaminophen (TYLENOL) 650 MG CR tablet Take 1,300 mg by mouth at bedtime as needed for pain.    Yes [provider]  allopurinol (ZYLOPRIM) 100 MG tablet TAKE 1 TABLET EVERY DAY Patient taking differently: Take 100 mg by mouth daily after breakfast.  10/27/19  Yes  Koberlein, Junell C, MD  apixaban (ELIQUIS) 5 MG TABS tablet TAKE 1 TABLET(5 MG) BY MOUTH TWICE DAILY Patient taking differently: Take 5 mg by mouth 2 (two) times daily after a meal.  09/02/19  Yes Koberlein, Junell C, MD  atorvastatin (LIPITOR) 20 MG tablet TAKE 1 TABLET EVERY DAY Patient taking differently: Take 20 mg by mouth daily after supper.  08/25/19  Yes Lewayne Bunting, MD  cholecalciferol (VITAMIN D) 1000 units tablet Take 1,000 Units by mouth daily after breakfast.    Yes [provider]  Docusate Calcium (STOOL SOFTENER PO) Take 2 capsules by mouth 2 (two) times daily.    Yes [provider]  donepezil (ARICEPT) 5 MG tablet TAKE 1 TABLET  BY MOUTH AT BEDTIME. Patient taking differently: Take 5 mg by mouth at bedtime.  12/07/19  Yes Koberlein, Junell C, MD  furosemide (LASIX) 40 MG tablet Take 2 tablets (80 mg total) by mouth daily. 12/19/19  Yes Zannie Cove, MD  magnesium oxide (MAG-OX) 400 (241.3 Mg) MG tablet TAKE 1 TABLET (400 MG  TOTAL) BY MOUTH 2 (TWO) TIMES DAILY. Patient taking differently: Take 400 mg by mouth See admin instructions. Take one tablet (400 mg) by mouth twice daily - after breakfast and at 2pm 08/26/18  Yes Crenshaw, Madolyn Frieze, MD  MELATONIN ER PO Take 1 tablet by mouth at bedtime.   Yes [provider]  tamsulosin (FLOMAX) 0.4 MG CAPS capsule TAKE 1 CAPSULE TWICE DAILY Patient taking differently: Take 0.4 mg by mouth 2 (two) times daily after a meal.  11/20/19  Yes Koberlein, Paris Lore, MD    Family History    Family History  Problem Relation Age of Onset   Heart disease Father    Heart disease Mother    Diabetes Mother    Hypertension Brother    Hypertension Brother    Cancer Neg Hx     Social History    Social History   Socioeconomic History   Marital status: Widowed    Spouse name: Not on file   Number of children: Not on file   Years of education: Not on file   Highest education level: Not on file  Occupational  History   Occupation: Retired  Tobacco Use   Smoking status: Never Smoker   Smokeless tobacco: Never Used  Substance and Sexual Activity   Alcohol use: No   Drug use: No   Sexual activity: Not on file  Other Topics Concern   Not on file  Social History Narrative   Not on file   Social Determinants of Health   Financial Resource Strain:    Difficulty of Paying Living Expenses: Not on file  Food Insecurity:    Worried About Programme researcher, broadcasting/film/video in the Last Year: Not on file   [employer] of Food in the Last Year: Not on file  Transportation Needs:    Lack of Transportation (Medical): Not on file   Lack of Transportation (Non-Medical): Not on file  Physical Activity:    Days of Exercise per Week: Not on file   Minutes of Exercise per Session: Not on file  Stress:    Feeling of Stress : Not on file  Social Connections:    Frequency of Communication with Friends and Family: Not on file   Frequency of Social Gatherings with Friends and Family: Not on file   Attends Religious Services: Not on file   Active Member of Clubs or Organizations: Not on file   Attends Banker Meetings: Not on file   Marital Status: Not on file  Intimate Partner Violence:    Fear of Current or Ex-Partner: Not on file   Emotionally Abused: Not on file   Physically Abused: Not on file   Sexually Abused: Not on file    Review of Systems   General: Denies fevers, chills, myalgias, decreased appetite and fatigue. Admits to overall feels well since discharge. Skin: Bilateral lower extremity edema with open, weeping Cardiovascular: Denies chest pain, palpitations, and SOB at rest. +lower extremity swelling Respiratory- Acute, mild SOB.no wheezing, coughing  Gastrointestinal: Denies nausea and vomiting. No reports of blood in the stools.  Neurological: Denies syncope, falls, numbness, and tingling. No changes in mental status. Denies abnormal anxiousness or restlessness.  All other systems reviewed  and are otherwise negative except as noted above.  Physical Exam    Blood pressure 98/71, pulse 87, temperature (!) 97.3 F (36.3 C), temperature source Oral, resp. rate 18, height  (1.727 m), weight 102.1 kg, SpO2 97 %.   General:  Elderly, NAD Skin: Bilateral lower extremity edema, weeping wounds Neck: Negative for carotid bruits. No JVD Lungs: Diminished in bilateral lower lobes. No wheezes, rales, or rhonchi. Breathing is unlabored. Cardiovascular: Irregularly irregular with S1 S2. No murmurs Abdomen: Soft, non-tender, non-distended. No obvious abdominal masses. Extremities: Bilateral 3+ LE edema with weeping wounds. DP pulses 2+ bilaterally Neuro: Alert and oriented. MAE spontaneously. Psych: Responds to questions appropriately with normal affect.    Labs    Troponin (Point of Care Test) No results for input(s): TROPIPOC in the last 72 hours. No results for input(s): CKTOTAL, CKMB, TROPONINI in the last 72 hours. Lab Results  Component Value Date   WBC 9.1 12/23/2019   HGB 13.5 12/23/2019   HCT 41.8 12/23/2019   MCV 96.8 12/23/2019   PLT 136 (L) 12/23/2019    Recent Labs  Lab 12/23/19 2035  NA 138  K 3.8  CL 99  CO2 32  BUN 36*  CREATININE 1.33*  CALCIUM 8.8*  PROT 5.7*  BILITOT 1.1  ALKPHOS 87  ALT 43  AST 41  GLUCOSE 158*   Lab Results  Component Value Date   CHOL 163 06/16/2019   HDL 74.60 06/16/2019   LDLCALC 78 06/16/2019   TRIG 54.0 06/16/2019   No results found for: [facility]   Radiology Studies    DG Chest 2 View  Result Date: 12/23/2019 CLINICAL DATA:  86 year old male with shortness of breath. EXAM: CHEST - 2 VIEW COMPARISON:  Chest radiograph dated 12/14/2019. FINDINGS: Shallow inspiration with bibasilar atelectasis. There is cardiomegaly with vascular congestion and small bilateral pleural effusion. Interval improvement of the previously seen faint right peripheral and subpleural densities. There is no pneumothorax. Stable cardiomegaly.  Left pectoral pacemaker device. No acute osseous pathology. IMPRESSION: 1. Overall interval improvement of the previously seen right lung densities since the prior radiograph. 2. Small bilateral pleural effusions and bibasilar atelectasis/infiltrate. 3. Stable cardiomegaly. Electronically Signed   By: Elgie Collard M.D.   On: 12/23/2019 21:17   DG Chest 2 View  Result Date: 12/14/2019 CLINICAL DATA:  Shortness of breath. Pneumonia. EXAM: CHEST - 2 VIEW COMPARISON:  One-view chest x-ray 12/12/2019. FINDINGS: The heart is enlarged. Pacing and defibrillator wires are stable. Right greater than left airspace disease is similar the prior study. Bilateral effusions are again noted, right greater than left. IMPRESSION: 1. Stable appearance of right greater than left airspace disease compatible with multifocal pneumonia. 2. Stable bilateral pleural effusions, right greater than left. Electronically Signed   By: Marin Roberts M.D.   On: 12/14/2019 14:17   DG CHEST PORT 1 VIEW  Result Date: 12/12/2019 CLINICAL DATA:  Shortness of breath EXAM: PORTABLE CHEST 1 VIEW COMPARISON:  12/11/2019 FINDINGS: Cardiac shadow remains enlarged. Pacing device is again noted and stable. Patchy airspace opacities are again seen and stable. The overall inspiratory effort is poor. No bony abnormality is noted. IMPRESSION: Stable patchy infiltrates bilaterally. Electronically Signed   By: Alcide Clever M.D.   On: 12/12/2019 11:46   DG Chest Portable 1 View  Result Date: 12/11/2019 CLINICAL DATA:  Shortness of breath EXAM: PORTABLE CHEST 1 VIEW COMPARISON:  02/19/2018 FINDINGS: Stable positioning of a left-sided implanted cardiac device. Heart size remains enlarged. Calcific aortic knob. Hazy airspace opacities most pronounced within the peripheral aspect of the right mid and lower lung fields. Possible trace bilateral pleural effusions. No pneumothorax. Severe degenerative changes of the bilateral shoulders. IMPRESSION: 1. Hazy  airspace opacity within the right mid and lower lung fields, suspicious  for pneumonia. 2. Possible trace bilateral pleural effusions. 3. Stable cardiomegaly. Electronically Signed   By: Duanne Guess D.O.   On: 12/11/2019 13:27   ECHOCARDIOGRAM COMPLETE  Result Date: 12/12/2019   ECHOCARDIOGRAM REPORT   Patient Name:   JERIAH CORKUM Date of Exam: 12/12/2019 Medical Rec #:  [id number]           Height:       68.0 in Accession #:    [id number]          Weight:       218.7 lb Date of Birth:  1929/02/26            BSA:          2.12 m Patient Age:    89 years            BP:           110/72 mmHg Patient Gender: M                   HR:           52 bpm. Exam Location:  Inpatient Procedure: 2D Echo and Intracardiac Opacification Agent Indications:    CHF-Acute Systolic 428.21 / I50.21  History:        Patient has prior history of Echocardiogram examinations, most                 recent 02/19/2018. Pacemaker, Arrythmias:Atrial Fibrillation,                 Signs/Symptoms:Dyspnea; Risk Factors:Hypertension, Dyslipidemia                 and Diabetes. Cardiomyoapthy. Lower extremity edema.  Sonographer:    Leta Jungling RDCS Referring Phys: [id number] MOHAMMAD L GARBA IMPRESSIONS  1. Left ventricular ejection fraction, by visual estimation, is 30 to 35%. The left ventricle has moderate to severely decreased function. There is no left ventricular hypertrophy.  2. Definity contrast agent was given IV to delineate the left ventricular endocardial borders.  3. Left ventricular diastolic function could not be evaluated.  4. The left ventricle demonstrates global hypokinesis.  5. Global right ventricle has normal systolic function.The right ventricular size is normal. No increase in right ventricular wall thickness.  6. Left atrial size was severely dilated.  7. Right atrial size was severely dilated.  8. The mitral valve is normal in structure. Mild to moderate mitral valve regurgitation. No evidence of mitral stenosis.  9. The  tricuspid valve is normal in structure. 10. The aortic valve is normal in structure. Aortic valve regurgitation is not visualized. Mild to moderate aortic valve sclerosis/calcification without any evidence of aortic stenosis. 11. The pulmonic valve was normal in structure. Pulmonic valve regurgitation is trivial. 12. Aortic dilatation noted. 13. There is mild dilatation of the ascending aorta measuring 42 mm. 14. Compared with the echo 02/2018, the ascending aorta has increased from 3.8 cm to 4.2 cm. 15. Moderately elevated pulmonary artery systolic pressure. 16. A pacer wire is visualized. 17. The inferior vena cava is normal in size with greater than 50% respiratory variability, suggesting right atrial pressure of 3 mmHg. FINDINGS  Left Ventricle: Left ventricular ejection fraction, by visual estimation, is 30 to 35%. The left ventricle has moderate to severely decreased function. Definity contrast agent was given IV to delineate the left ventricular endocardial borders. The left ventricle demonstrates global hypokinesis. There is no left ventricular hypertrophy. The left ventricular diastology could not be evaluated due to  atrial fibrillation. Left ventricular diastolic function could not be evaluated. Normal left atrial pressure. Right Ventricle: The right ventricular size is normal. No increase in right ventricular wall thickness. Global RV systolic function is has normal systolic function. The tricuspid regurgitant velocity is 2.51 m/s, and with an assumed right atrial pressure  of 15 mmHg, the estimated right ventricular systolic pressure is moderately elevated at 40.2 mmHg. Left Atrium: Left atrial size was severely dilated. Right Atrium: Right atrial size was severely dilated Pericardium: There is no evidence of pericardial effusion. Mitral Valve: The mitral valve is normal in structure. Mild to moderate mitral valve regurgitation. No evidence of mitral valve stenosis by observation. Tricuspid Valve: The  tricuspid valve is normal in structure. Tricuspid valve regurgitation mild-moderate. Aortic Valve: The aortic valve is normal in structure. Aortic valve regurgitation is not visualized. Mild to moderate aortic valve sclerosis/calcification is present, without any evidence of aortic stenosis. There is moderate calcification of the aortic valve. Aortic valve mean gradient measures 3.5 mmHg. Aortic valve peak gradient measures 6.2 mmHg. Aortic valve area, by VTI measures 3.69 cm. Pulmonic Valve: The pulmonic valve was normal in structure. Pulmonic valve regurgitation is trivial. Pulmonic regurgitation is trivial. Aorta: The aortic root, ascending aorta and aortic arch are all structurally normal, with no evidence of dilitation or obstruction and aortic dilatation noted. There is mild dilatation of the ascending aorta measuring 42 mm. Compared with the echo 02/2018, the ascending aorta has increased from 3.8 cm to 4.2 cm. Venous: The inferior vena cava is normal in size with greater than 50% respiratory variability, suggesting right atrial pressure of 3 mmHg. IAS/Shunts: No atrial level shunt detected by color flow Doppler. There is no evidence of a patent foramen ovale. No ventricular septal defect is seen or detected. There is no evidence of an atrial septal defect. Additional Comments: A pacer wire is visualized.  LEFT VENTRICLE PLAX 2D LVIDd:         6.97 cm LVIDs:         6.06 cm LV PW:         1.18 cm LV IVS:        0.79 cm LVOT diam:     2.60 cm LV SV:         69 ml LV SV Index:   31.06 LVOT Area:     5.31 cm  LV Volumes (MOD) LV area d, A2C:    48.70 cm LV area d, A4C:    46.18 cm LV area s, A2C:    37.30 cm LV area s, A4C:    36.40 cm LV major d, A2C:   8.37 cm LV major d, A4C:   8.56 cm LV major s, A2C:   7.11 cm LV major s, A4C:   8.83 cm LV vol d, MOD A2C: 227.0 ml LV vol d, MOD A4C: 208.8 ml LV vol s, MOD A2C: 158.0 ml LV vol s, MOD A4C: 124.0 ml LV SV MOD A2C:     69.0 ml LV SV MOD A4C:     208.8 ml  LV SV MOD BP:      62.7 ml RIGHT VENTRICLE TAPSE (M-mode): 1.7 cm LEFT ATRIUM              Index       RIGHT ATRIUM           Index LA diam:        5.50 cm  2.59 cm/m  RA Area:  31.50 cm LA Vol (A2C):   166.0 ml 78.20 ml/m RA Volume:   107.00 ml 50.40 ml/m LA Vol (A4C):   173.0 ml 81.49 ml/m LA Biplane Vol: 170.0 ml 80.08 ml/m  AORTIC VALVE AV Area (Vmax):    3.96 cm AV Area (Vmean):   3.75 cm AV Area (VTI):     3.69 cm AV Vmax:           124.00 cm/s AV Vmean:          85.300 cm/s AV VTI:            0.171 m AV Peak Grad:      6.2 mmHg AV Mean Grad:      3.5 mmHg LVOT Vmax:         92.40 cm/s LVOT Vmean:        60.200 cm/s LVOT VTI:          0.119 m LVOT/AV VTI ratio: 0.70  AORTA Ao Root diam: 3.40 cm Ao Asc diam:  4.20 cm MR Peak grad: 70.6 mmHg   TRICUSPID VALVE MR Mean grad: 47.0 mmHg   TR Peak grad:   25.2 mmHg MR Vmax:      420.00 cm/s TR Vmax:        251.00 cm/s MR Vmean:     324.0 cm/s                           SHUNTS                           Systemic VTI:  0.12 m                           Systemic Diam: 2.60 cm  Chilton Si MD Electronically signed by Chilton Si MD Signature Date/Time: 12/12/2019/2:21:27 PM    Final    CUP PACEART REMOTE DEVICE CHECK  Result Date: 12/03/2019 Scheduled remote reviewed.  Normal device function.  5 HVR episodes; longest lasting 3 seconds. VP reported at 65% since 10/20/19, recent increase in ventricular rate noted, and thoracic impedance below baseline.  Will forward for review. Next remote 91 days.  [facility]   ECG & Cardiac Imaging    Echo 12/12/19  1. Left ventricular ejection fraction, by visual estimation, is 30 to 35%. The left ventricle has moderate to severely decreased function. There is no left ventricular hypertrophy.  2. Definity contrast agent was given IV to delineate the left ventricular endocardial borders.  3. Left ventricular diastolic function could not be evaluated.  4. The left ventricle demonstrates global hypokinesis.  5.  Global right ventricle has normal systolic function.The right ventricular size is normal. No increase in right ventricular wall thickness.  6. Left atrial size was severely dilated.  7. Right atrial size was severely dilated.  8. The mitral valve is normal in structure. Mild to moderate mitral valve regurgitation. No evidence of mitral stenosis.  9. The tricuspid valve is normal in structure. 10. The aortic valve is normal in structure. Aortic valve regurgitation is not visualized. Mild to moderate aortic valve sclerosis/calcification without any evidence of aortic stenosis. 11. The pulmonic valve was normal in structure. Pulmonic valve regurgitation is trivial. 12. Aortic dilatation noted. 13. There is mild dilatation of the ascending aorta measuring 42 mm. 14. Compared with the echo 02/2018, the ascending aorta has increased from 3.8 cm to 4.2 cm.  15. Moderately elevated pulmonary artery systolic pressure. 16. A pacer wire is visualized. 17. The inferior vena cava is normal in size with greater than 50% respiratory variability, suggesting right atrial pressure of 3 mmHg.  Assessment & Plan    1. Acute on chronic systolic CHF/nonischemic cardiomyopathy: -Most recent echocardiogram performed last hospital admission 12/2019 showed showed LVEF at 30 to 35% (down from 40 to 45% 32,018) with global hypokinesis, severe biatrial enlargement, mild to moderate MR and mild dilatation of the ascending aorta measuring 42 mm.  -Diuresed approximately 4.4L with a discharge weight of 223lb.  Reports daily home weights maintained at 223lb with no acute changes.  Per discharge summary, patient was discharged on Lasix 80 mg p.o. daily>> to be resumed 2 days (1/9) after hospital discharge with instructions to take an additional 40 mg for weight gain of 2 to 3 pounds.  -Reports he was working with PT on day of presentation and was doing well without significant shortness of breath with ambulation and no significant LE  edema. Shortly after working with PT he noticed significant bilateral lower extremity edema and shortness of breath therefore he contacted his PCP at which time it was recommended that he proceed to the ED -BNP on presentation 788.7 with CXR showing improvement from last hospitalization however with small bilateral pleural effusions with bibasilar atelectasis/infiltrate and stable cardiomegaly. -He was started on IV Lasix 40 mg twice daily with improvement -Creatinine, stable at 133 with a baseline of 1.0>> likely in the setting of fluid volume overload -We will continue this diuretic regimen for now and monitor for improvement -Daily BMET  -Continue with strict I&O, daily weight -Place wound consultation for lower extremity weeping -May need Unna boot placement  2.  Bilateral lower extremity edema with weeping wounds/venous stasis: -Patient reports this is chronic BLE however >>had significant edema after working with PT as described above  -We will continue with diuresis with Lasix IV 40 mg twice daily and monitor closely  -We will place wound care consultation -No leukocytosis on lab work therefore will hold off on antibiotic therapy for now.  If suspicious of infection, consider Rocephin therapy -Anticipate the need for Unna boots  3. Permanent atrial fibrillation with history of biventricular ICD: -Currently rate control, 60s -Recently started on low-dose carvedilol during last hospitalization  -BP soft but stable at 98/81, 101/82, 114/66  -Continue chronic anticoagulation with Eliquis 5 mg twice daily  4. Hypertension: -Soft, 98/81, 101/82, 114/66 -Hold antihypertensives for now in the setting of diuresis -Continue to monitor closely -Up with assistance only  5.  Hyperlipidemia: -Continue home statin  6. DM2: -Stable, hemoglobin A1c is 7.0 -We will start SSI for glucose control inpatient status      7. Mild dementia: -Continue Aricept   8. Gout: -Continue  allopurinol   Severity of Illness: The appropriate patient status for this patient is OBSERVATION. Observation status is judged to be reasonable and necessary in order to provide the required intensity of service to ensure the patient's safety. The patient's presenting symptoms, physical exam findings, and initial radiographic and laboratory data in the context of their medical condition is felt to place them at decreased risk for further clinical deterioration. Furthermore, it is anticipated that the patient will be medically stable for discharge from the hospital within 2 midnights of admission. The following factors support the patient status of observation.   " The patient's presenting symptoms include shortness of breath. " The physical exam findings include severe BLE. "  The initial radiographic and laboratory data are CXR with pleural effusions.    SignedKathyrn Drown NP-C [facility] Pager: [phone] 12/24/2019, @NOW   Patient examined chart reviewed. Tried to call daughter Neoma Laming and left voice male. Frail elderly male just d/c from hospital with volume overload. EF by recent echo 30-35% Also has chronic LE edema with venous insufficiency and chronic blistering / erythema. Recently d/c from hospital for same issue Got more dyspnea after working with PT at home Weight same as discharge RLE / foot weeping No fever or signs of acute cellulitis. In ER sats ok comfortable very hard of hearing AICD under left clavicle mildly decreased BS at base. No murmur LE with plus 3 edema erythema and open blisters > RLE foot. Will need would care consult and possible UNA boots. IV bid diuretics with risk of hypotension and azotemia. Afib rate well controlled on beta blocker and eliquis  Overall not real optimistic that his legs are going to get a lot better in near future  Jenkins Rouge MD [facility]

## 2019-12-24 NOTE — Telephone Encounter (Signed)
Copied from CRM [id number]. Topic: Quick Communication - See Telephone Encounter >> Dec 24, 2019  8:50 AM Aretta Nip wrote: CRM for notification. See Telephone encounter for: 12/24/19.pt daughter calling as Dr Kirtland Bouchard requested her father was admitted to ER yesterday  with his legs swelling up so bad that by the time he was seen that they had burst open. He was also SOB.

## 2019-12-24 NOTE — ED Notes (Signed)
Pacemaker interrogated. 

## 2019-12-24 NOTE — ED Notes (Signed)
Pt resting in bed. No distress noted. Pt denies new or worsening complaints. Will continue to monitor.

## 2019-12-24 NOTE — Consult Note (Addendum)
WOC Nurse Consult Note: Reason for Consult: Consult requested for bilat legs.  Pt developed weeping a few days ago. Wound type: Left leg with generalized edema and erythremia, no open wounds, small amt yellow drainage Right leg with generalized edema and erythremia, no open wounds, large amt yellow drainage.  Previous blister to anterior calf has ruptured and evolved into a partial thickness wound; 6X6X.1cm, red and moist. Dressing procedure/placement/frequency: Topical treatment orders provided for bedside nurses to perform Q day as follows to decrease edema and absorb drainage: Change dressing to bilat legs Q day as follows: Apply ABD pads, then kerlex in a spiral fashion, beginning from behind toes to below knees, then ace wrap in the same fashion. APPLY xeroform gauze dressing to right leg Q day before applying other items. Please re-consult if further assistance is needed.  Thank-you,  Cammie Mcgee MSN, RN, CWOCN, [place], CNS [phone]

## 2019-12-24 NOTE — Telephone Encounter (Signed)
Left message for patient's daughter aware dr Jens Som has reviewed the hospital notes.

## 2019-12-24 NOTE — ED Notes (Signed)
Pt resting in bed. No distress noted. Will continue to monitor.  

## 2019-12-24 NOTE — Telephone Encounter (Signed)
Copied from CRM [id number]. Topic: General - Other >> Dec 24, 2019 12:28 PM Tamela Oddi wrote: Reason for CRM: Called to inform the doctor that they got the orders but the patient has gone back to the hospital so they could not do the CMP, UA or nurse care.  Please advise.  CB# [phone]

## 2019-12-24 NOTE — Telephone Encounter (Signed)
Ok but patient is back in hospital right now.

## 2019-12-24 NOTE — Telephone Encounter (Signed)
Noted. Note sent through [facility]

## 2019-12-24 NOTE — ED Provider Notes (Signed)
TIME SEEN: 3:21 AM  CHIEF COMPLAINT: Shortness of breath, bilateral lower extremity swelling and weeping  HPI: Patient is an 86 year old male with history of CHF status post pacemaker, hypertension, diabetes, hyperlipidemia who presents to the emergency department with his daughter for concerns for increased shortness of breath over the past day and significant increase in bilateral lower extremity swelling to the point that he has had ruptured blisters that are weeping.  Talk to his primary care physician who recommended he come back to the hospital.  Was just discharged on 12/18/2019 for CHF exacerbation.  Has been taking Lasix 40 mg twice daily.  Patient denies any chest pain.  No fevers or cough.  No known Covid exposures.  ROS: See HPI Constitutional: no fever  Eyes: no drainage  ENT: no runny nose   Cardiovascular:  no chest pain  Resp:  SOB  GI: no vomiting GU: no dysuria Integumentary: no rash  Allergy: no hives  Musculoskeletal: no leg swelling  Neurological: no slurred speech ROS otherwise negative  PAST MEDICAL HISTORY/PAST SURGICAL HISTORY:  Past Medical History:  Diagnosis Date  . Anemia   . Atrial fibrillation ([place])   . BPH (benign prostatic hyperplasia)   . Bradycardia   . Cardiomyopathy   . CHF (congestive heart failure) ([place])   . Diabetes mellitus type II   . GERD (gastroesophageal reflux disease)   . History of colonoscopy   . HTN (hypertension)   . Hyperlipidemia   . ICD (implantable cardiac defibrillator) in place 10/14/2012   biventricular  . OA (osteoarthritis)   . Obesity   . Peptic ulcer     MEDICATIONS:  Prior to Admission medications   Medication Sig Start Date End Date Taking? Authorizing Provider  acetaminophen (TYLENOL) 650 MG CR tablet Take 1,300 mg by mouth at bedtime as needed for pain.     [provider]  allopurinol (ZYLOPRIM) 100 MG tablet TAKE 1 TABLET EVERY DAY Patient taking differently: Take 100 mg by mouth daily after  breakfast.  10/27/19   Koberlein, Steele Berg, MD  apixaban (ELIQUIS) 5 MG TABS tablet TAKE 1 TABLET(5 MG) BY MOUTH TWICE DAILY Patient taking differently: Take 5 mg by mouth 2 (two) times daily after a meal.  09/02/19   Koberlein, Junell C, MD  atorvastatin (LIPITOR) 20 MG tablet TAKE 1 TABLET EVERY DAY Patient taking differently: Take 20 mg by mouth daily after supper.  08/25/19   Lelon Perla, MD  cholecalciferol (VITAMIN D) 1000 units tablet Take 1,000 Units by mouth daily after breakfast.     [provider]  Docusate Calcium (STOOL SOFTENER PO) Take 2 capsules by mouth 2 (two) times daily.     [provider]  donepezil (ARICEPT) 5 MG tablet TAKE 1 TABLET  BY MOUTH AT BEDTIME. Patient taking differently: Take 5 mg by mouth at bedtime.  12/07/19   Caren Macadam, MD  furosemide (LASIX) 40 MG tablet Take 2 tablets (80 mg total) by mouth daily. 12/19/19   Domenic Polite, MD  magnesium oxide (MAG-OX) 400 (241.3 Mg) MG tablet TAKE 1 TABLET (400 MG TOTAL) BY MOUTH 2 (TWO) TIMES DAILY. Patient taking differently: Take 400 mg by mouth See admin instructions. Take one tablet (400 mg) by mouth twice daily - after breakfast and at 2pm 08/26/18   Lelon Perla, MD  MELATONIN ER PO Take 1 tablet by mouth at bedtime.    [provider]  tamsulosin (FLOMAX) 0.4 MG CAPS capsule TAKE 1 CAPSULE TWICE  DAILY Patient taking differently: Take 0.4 mg by mouth 2 (two) times daily after a meal.  11/20/19   Koberlein, Paris Lore, MD    ALLERGIES:  No Known Allergies  SOCIAL HISTORY:  Social History   Tobacco Use  . Smoking status: Never Smoker  . Smokeless tobacco: Never Used  Substance Use Topics  . Alcohol use: No    FAMILY HISTORY: Family History  Problem Relation Age of Onset  . Heart disease Father   . Heart disease Mother   . Diabetes Mother   . Hypertension Brother   . Hypertension Brother   . Cancer Neg Hx     EXAM: BP 90/71 (BP Location: Left Arm)    Pulse 72   Temp (!) 97.3 F (36.3 C) (Oral)   Resp 14   Ht 5\' 8"  (1.727 m)   Wt 102.1 kg   SpO2 99%   BMI 34.21 kg/m  CONSTITUTIONAL: Alert and oriented and responds appropriately to questions.  Elderly, in no distress HEAD: Normocephalic EYES: Conjunctivae clear, pupils appear equal, EOM appear intact ENT: normal nose; moist mucous membranes NECK: Supple, normal ROM CARD: RRR; S1 and S2 appreciated; no murmurs, no clicks, no rubs, no gallops RESP: Normal chest excursion without splinting, mildly tachypneic, no hypoxia on room air, bibasilar Rales, no rhonchi or wheezing ABD/GI: Normal bowel sounds; non-distended; soft, non-tender, no rebound, no guarding, no peritoneal signs, no hepatosplenomegaly BACK:  The back appears normal EXT: Normal ROM in all joints; no deformity noted, 3+ pitting edema in bilateral lower extremities to the knees, he has an open blister to the anterior distal right lower extremity, no redness or warmth, signs of venous stasis dermatitis bilaterally SKIN: Normal color for age and race; warm; no rash on exposed skin NEURO: Moves all extremities equally PSYCH: The patient's mood and manner are appropriate.   MEDICAL DECISION MAKING: Patient here with shortness of breath, increasing edema.  Chest x-ray appears improved compared to his last during his recent admission but still shows vascular congestion, bilateral pleural effusions.  There is also bibasilar atelectasis versus infiltrates.  He has no fever, cough or known Covid exposures.  No fever here.  No leukocytosis.  BNP elevated at 788.  He has had no chest pain.  EKG shows paced rhythm.  We will interrogate pacemaker.  I feel he needs admission for IV diuresis as does his daughter.  Will discuss with hospitalist.  ED PROGRESS: 3:28 AM Discussed patient's case with hospitalist, Dr. .  I have recommended admission and patient (and family if present) agree with this plan. Admitting physician will place  admission orders.   I reviewed all nursing notes, vitals, pertinent previous records and interpreted all EKGs, lab and urine results, imaging (as available).     EKG Interpretation  Date/Time:  Wednesday December 23 2019 20:43:40 EST Ventricular Rate:  77 PR Interval:  100 QRS Duration: 108 QT Interval:  414 QTC Calculation: 468 R Axis:   74 Text Interpretation: VENTRICULAR PACED RHYTHM Premature ventricular complexes Confirmed by 06-22-1988 [id number]) on 12/24/2019 2:48:18 AM         [name] was evaluated in Emergency Department on 12/24/2019 for the symptoms described in the history of present illness. He was evaluated in the context of the global COVID-19 pandemic, which necessitated consideration that the patient might be at risk for infection with the SARS-CoV-2 virus that causes COVID-19. Institutional protocols and algorithms that pertain to the evaluation of patients at risk for COVID-19 are  in a state of rapid change based on information released by regulatory bodies including the CDC and federal and state organizations. These policies and algorithms were followed during the patient's care in the ED.  Patient was seen wearing N95, face shield, gloves.    Ambar Raphael, Layla Maw, DO 12/24/19 [phone]

## 2019-12-24 NOTE — Telephone Encounter (Signed)
Patient currently admitted at [facility] as of 12/23/2019

## 2019-12-24 NOTE — Telephone Encounter (Signed)
Spoke with [employer] and informed her of the orders and message below.

## 2019-12-24 NOTE — ED Notes (Signed)
Attempted to update daughter, left message

## 2019-12-24 NOTE — ED Notes (Signed)
Pt care endorsed from [facility]. Pt conscious, breathing, and A&Ox4. No distress noted.

## 2019-12-25 ENCOUNTER — Other Ambulatory Visit: Payer: Self-pay

## 2019-12-25 LAB — CBC WITH DIFFERENTIAL/PLATELET
Abs Immature Granulocytes: 0.02 10*3/uL (ref 0.00–0.07)
Basophils Absolute: 0 10*3/uL (ref 0.0–0.1)
Basophils Relative: 0 %
Eosinophils Absolute: 0.1 10*3/uL (ref 0.0–0.5)
Eosinophils Relative: 1 %
HCT: 38 % — ABNORMAL LOW (ref 39.0–52.0)
Hemoglobin: 12.5 g/dL — ABNORMAL LOW (ref 13.0–17.0)
Immature Granulocytes: 0 %
Lymphocytes Relative: 18 %
Lymphs Abs: 1.3 10*3/uL (ref 0.7–4.0)
MCH: 31.1 pg (ref 26.0–34.0)
MCHC: 32.9 g/dL (ref 30.0–36.0)
MCV: 94.5 fL (ref 80.0–100.0)
Monocytes Absolute: 0.8 10*3/uL (ref 0.1–1.0)
Monocytes Relative: 11 %
Neutro Abs: 5.2 10*3/uL (ref 1.7–7.7)
Neutrophils Relative %: 70 %
Platelets: UNDETERMINED 10*3/uL (ref 150–400)
RBC: 4.02 MIL/uL — ABNORMAL LOW (ref 4.22–5.81)
RDW: 14.3 % (ref 11.5–15.5)
WBC: 7.4 10*3/uL (ref 4.0–10.5)
nRBC: 0 % (ref 0.0–0.2)

## 2019-12-25 LAB — BASIC METABOLIC PANEL
Anion gap: 11 (ref 5–15)
Anion gap: 10 (ref 5–15)
BUN: 30 mg/dL — ABNORMAL HIGH (ref 8–23)
BUN: 31 mg/dL — ABNORMAL HIGH (ref 8–23)
CO2: 30 mmol/L (ref 22–32)
CO2: 29 mmol/L (ref 22–32)
Calcium: 8.7 mg/dL — ABNORMAL LOW (ref 8.9–10.3)
Calcium: 8.5 mg/dL — ABNORMAL LOW (ref 8.9–10.3)
Chloride: 96 mmol/L — ABNORMAL LOW (ref 98–111)
Chloride: 96 mmol/L — ABNORMAL LOW (ref 98–111)
Creatinine, Ser: 1.13 mg/dL (ref 0.61–1.24)
Creatinine, Ser: 1.06 mg/dL (ref 0.61–1.24)
GFR calc Af Amer: >60 mL/min (ref >60)
GFR calc Af Amer: >60 mL/min (ref >60)
GFR calc non Af Amer: 58 mL/min — ABNORMAL LOW (ref >60)
GFR calc non Af Amer: >60 mL/min (ref >60)
Glucose, Bld: 128 mg/dL — ABNORMAL HIGH (ref 70–99)
Glucose, Bld: 179 mg/dL — ABNORMAL HIGH (ref 70–99)
Potassium: 3.5 mmol/L (ref 3.5–5.1)
Potassium: 4.3 mmol/L (ref 3.5–5.1)
Sodium: 137 mmol/L (ref 135–145)
Sodium: 135 mmol/L (ref 135–145)

## 2019-12-25 MED ORDER — POTASSIUM CHLORIDE CRYS ER 20 MEQ PO TBCR
20.0000 meq | EXTENDED_RELEASE_TABLET | Freq: Every day | ORAL | Status: DC
  Administered 2019-12-25: 10:00:00 20 meq via ORAL
  Filled 2019-12-25: qty 1

## 2019-12-25 NOTE — Telephone Encounter (Signed)
Spoke with Seward Grater and informed her of the message below.

## 2019-12-25 NOTE — Progress Notes (Signed)
Subjective:  Denies SSCP, palpitations or Dyspnea Eating breakfast   Objective:  Vitals:   12/24/19 2036 12/25/19 0037 12/25/19 0432 12/25/19 0435  BP: 109/68 113/74  104/68  Pulse: (!) 102 95  (!) 45  Resp: 18 20  20   Temp: 97.9 F (36.6 C) 97.7 F (36.5 C)  99.6 F (37.6 C)  TempSrc: Oral Oral    SpO2: 100% 100%  96%  Weight:   97.8 kg   Height:        Intake/Output from previous day:  Intake/Output Summary (Last 24 hours) at 12/25/2019 0801 Last data filed at 12/25/2019 0516 Gross per 24 hour  Intake 620 ml  Output 2825 ml  Net -2205 ml    Physical Exam:  Affect appropriate Elderly white male  HEENT: normal Neck supple with no adenopathy JVP normal no bruits no thyromegaly Lungs clear with no wheezing and good diaphragmatic motion Heart:  S1/S2 no murmur, no rub, gallop or click PMI enlarged AICD under left clavicle  Abdomen: benighn, BS positve, no tenderness, no AAA no bruit.  No HSM or HJR Distal pulses intact with no bruits Plus 2 LE edema legs wrapped  Neuro non-focal Skin warm and dry No muscular weakness   Lab Results: Basic Metabolic Panel: Recent Labs    12/23/19 2035 12/23/19 2035 12/24/19 1730 12/25/19 0351  NA 138  --   --  137  K 3.8  --   --  3.5  CL 99  --   --  96*  CO2 32  --   --  30  GLUCOSE 158*  --   --  128*  BUN 36*  --   --  30*  CREATININE 1.33*   < > 1.02 1.13  CALCIUM 8.8*  --   --  8.7*  MG 2.0  --   --   --    < > = values in this interval not displayed.   Liver Function Tests: Recent Labs    12/23/19 2035  AST 41  ALT 43  ALKPHOS 87  BILITOT 1.1  PROT 5.7*  ALBUMIN 3.2*   No results for input(s): LIPASE, AMYLASE in the last 72 hours. CBC: Recent Labs    12/23/19 2035 12/23/19 2035 12/24/19 1730 12/25/19 0351  WBC 9.1   < > 8.0 7.4  NEUTROABS 6.8  --   --  5.2  HGB 13.5   < > 13.6 12.5*  HCT 41.8   < > 41.5 38.0*  MCV 96.8   < > 94.5 94.5  PLT 136*   < > 135* PLATELET CLUMPS NOTED ON SMEAR,  UNABLE TO ESTIMATE   < > = values in this interval not displayed.    Imaging: DG Chest 2 View  Result Date: 12/23/2019 CLINICAL DATA:  86 year old male with shortness of breath. EXAM: CHEST - 2 VIEW COMPARISON:  Chest radiograph dated 12/14/2019. FINDINGS: Shallow inspiration with bibasilar atelectasis. There is cardiomegaly with vascular congestion and small bilateral pleural effusion. Interval improvement of the previously seen faint right peripheral and subpleural densities. There is no pneumothorax. Stable cardiomegaly. Left pectoral pacemaker device. No acute osseous pathology. IMPRESSION: 1. Overall interval improvement of the previously seen right lung densities since the prior radiograph. 2. Small bilateral pleural effusions and bibasilar atelectasis/infiltrate. 3. Stable cardiomegaly. Electronically Signed   By: Anner Crete M.D.   On: 12/23/2019 21:17    Cardiac Studies:  ECG: afib escape V pacing PVC    Telemetry: afib  12/25/2019  Echo: 12/12/19 EF 30-35%   Medications:   . allopurinol  100 mg Oral QPC breakfast  . apixaban  5 mg Oral BID PC  . atorvastatin  20 mg Oral QPC supper  . donepezil  5 mg Oral QHS  . furosemide  40 mg Intravenous BID  . magnesium oxide  400 mg Oral BID  . Melatonin  6 mg Oral QHS  . sodium chloride flush  3 mL Intravenous Q12H  . sodium chloride flush  3 mL Intravenous Q12H  . tamsulosin  0.4 mg Oral BID PC     . sodium chloride    . sodium chloride      Assessment/Plan:  [employer] a 86 y.o.malewith a historyof normal coronaries on cardiac catheterization in 2006, chronic systolic CHF/non-ischemic cardiomyopathy with EF of 30-35% on echo in 12/2019, s/p BiV ICD placement in 10/2012 which was downgraded to BiV PPM in 07/2016, permanent atrial fibrillation on Eliquis, hypertension, hyperlipidemia, type 2 diabetes mellitus, BPH,who is being admitted to Cardiology service today for CHF.   1. Acute on chronic systolic  CHF/nonischemic cardiomyopathy: -improved over 2 L initial diuresis Wound care suggestions applied and legs wrapped will ask PT to see  ADD Kdur   2.  Bilateral lower extremity edema with weeping wounds/venous stasis: -Patient reports this is chronic BLE however >>had significant edema after working with PT as described above  -We will continue with diuresis with Lasix IV 40 mg twice daily and monitor closely  -No leukocytosis on lab work therefore will hold off on antibiotic therapy for now.  If suspicious of infection, consider Rocephin therapy Wound care suggestions appreciated Suspect this also represents LE venous insufficiency not just CHF   3. Permanent atrial fibrillation with history of biventricular ICD: -Currently rate control, 60s -Recently started on low-dose carvedilol during last hospitalization  -Continue chronic anticoagulation with Eliquis 5 mg twice daily  4. Hypertension: -Soft, 98/81, 101/82, 114/66 -Hold antihypertensives for now in the setting of diuresis -Continue to monitor closely -Up with assistance only  5.  Hyperlipidemia: -Continue home statin  6. DM2: -Stable, hemoglobin A1c is 7.0 -We will start SSI for glucose control inpatient status    7. Mild dementia: -Continue Aricept  8. Gout: -Continue allopurinol  Charlton Haws 12/25/2019, 8:01 AM

## 2019-12-25 NOTE — Telephone Encounter (Signed)
Labs no longer needed

## 2019-12-25 NOTE — Plan of Care (Signed)
  Problem: Education: Goal: Ability to demonstrate management of disease process will improve Outcome: Progressing   Problem: Education: Goal: Ability to verbalize understanding of medication therapies will improve Outcome: Progressing   Problem: Cardiac: Goal: Ability to achieve and maintain adequate cardiopulmonary perfusion will improve Outcome: Progressing   Problem: Health Behavior/Discharge Planning: Goal: Ability to manage health-related needs will improve Outcome: Progressing   Problem: Education: Goal: Knowledge of General Education information will improve Description: Including pain rating scale, medication(s)/side effects and non-pharmacologic comfort measures Outcome: Progressing

## 2019-12-26 ENCOUNTER — Encounter (HOSPITAL_COMMUNITY): Payer: Self-pay | Admitting: Cardiovascular Disease

## 2019-12-26 LAB — BASIC METABOLIC PANEL
Anion gap: 11 (ref 5–15)
BUN: 30 mg/dL — ABNORMAL HIGH (ref 8–23)
CO2: 29 mmol/L (ref 22–32)
Calcium: 8.5 mg/dL — ABNORMAL LOW (ref 8.9–10.3)
Chloride: 95 mmol/L — ABNORMAL LOW (ref 98–111)
Creatinine, Ser: 1.22 mg/dL (ref 0.61–1.24)
GFR calc Af Amer: >60 mL/min (ref >60)
GFR calc non Af Amer: 53 mL/min — ABNORMAL LOW (ref >60)
Glucose, Bld: 143 mg/dL — ABNORMAL HIGH (ref 70–99)
Potassium: 3.4 mmol/L — ABNORMAL LOW (ref 3.5–5.1)
Sodium: 135 mmol/L (ref 135–145)

## 2019-12-26 MED ORDER — POTASSIUM CHLORIDE CRYS ER 20 MEQ PO TBCR
40.0000 meq | EXTENDED_RELEASE_TABLET | Freq: Every day | ORAL | Status: DC
  Administered 2019-12-26 – 2019-12-28 (×3): 40 meq via ORAL
  Filled 2019-12-26 (×3): qty 2

## 2019-12-26 NOTE — Progress Notes (Signed)
Subjective:  Denies SSCP, palpitations or Dyspnea Wants water   Objective:  Vitals:   12/25/19 1122 12/25/19 2056 12/26/19 0400 12/26/19 0442  BP: 98/82 128/86  120/75  Pulse: (!) 48 (!) 104  74  Resp: 18 16  16   Temp: 97.8 F (36.6 C) 97.6 F (36.4 C)  97.6 F (36.4 C)  TempSrc:      SpO2: 100% 97%  97%  Weight:   97.6 kg   Height:        Intake/Output from previous day:  Intake/Output Summary (Last 24 hours) at 12/26/2019 0756 Last data filed at 12/26/2019 0600 Gross per 24 hour  Intake 1240 ml  Output 1850 ml  Net -610 ml    Physical Exam:  Affect appropriate Elderly white male  HEENT: normal Neck supple with no adenopathy JVP normal no bruits no thyromegaly Lungs clear with no wheezing and good diaphragmatic motion Heart:  S1/S2 no murmur, no rub, gallop or click PMI enlarged AICD under left clavicle  Abdomen: benighn, BS positve, no tenderness, no AAA no bruit.  No HSM or HJR Distal pulses intact with no bruits Plus 2 LE edema legs wrapped  Neuro non-focal Skin warm and dry No muscular weakness   Lab Results: Basic Metabolic Panel: Recent Labs    12/23/19 2035 12/24/19 1730 12/25/19 0831 12/26/19 0500  NA 138   < > 135 135  K 3.8   < > 4.3 3.4*  CL 99   < > 96* 95*  CO2 32   < > 29 29  GLUCOSE 158*   < > 179* 143*  BUN 36*   < > 31* 30*  CREATININE 1.33*   < > 1.06 1.22  CALCIUM 8.8*   < > 8.5* 8.5*  MG 2.0  --   --   --    < > = values in this interval not displayed.   Liver Function Tests: Recent Labs    12/23/19 2035  AST 41  ALT 43  ALKPHOS 87  BILITOT 1.1  PROT 5.7*  ALBUMIN 3.2*   No results for input(s): LIPASE, AMYLASE in the last 72 hours. CBC: Recent Labs    12/23/19 2035 12/23/19 2035 12/24/19 1730 12/25/19 0351  WBC 9.1   < > 8.0 7.4  NEUTROABS 6.8  --   --  5.2  HGB 13.5   < > 13.6 12.5*  HCT 41.8   < > 41.5 38.0*  MCV 96.8   < > 94.5 94.5  PLT 136*   < > 135* PLATELET CLUMPS NOTED ON SMEAR, UNABLE TO  ESTIMATE   < > = values in this interval not displayed.    Imaging: No results found.  Cardiac Studies:  ECG: afib escape V pacing PVC    Telemetry: afib  12/26/2019   Echo: 12/12/19 EF 30-35%   Medications:   . allopurinol  100 mg Oral QPC breakfast  . apixaban  5 mg Oral BID PC  . atorvastatin  20 mg Oral QPC supper  . donepezil  5 mg Oral QHS  . furosemide  40 mg Intravenous BID  . magnesium oxide  400 mg Oral BID  . Melatonin  6 mg Oral QHS  . potassium chloride  20 mEq Oral Daily  . sodium chloride flush  3 mL Intravenous Q12H  . sodium chloride flush  3 mL Intravenous Q12H  . tamsulosin  0.4 mg Oral BID PC     . sodium chloride    .  sodium chloride      Assessment/Plan:  [employer] a 86 y.o.malewith a historyof normal coronaries on cardiac catheterization in 3536, chronic systolic CHF/non-ischemic cardiomyopathy with EF of 30-35% on echo in 12/2019, s/p BiV ICD placement in 10/2012 which was downgraded to BiV PPM in 07/2016, permanent atrial fibrillation on Eliquis, hypertension, hyperlipidemia, type 2 diabetes mellitus, BPH,who is being admitted to Cardiology service today for CHF.   1. Acute on chronic systolic CHF/nonischemic cardiomyopathy: -improved diuresis slowing Wound care suggestions applied and legs wrapped will ask PT to see  K dur addes   2.  Bilateral lower extremity edema with weeping wounds/venous stasis: -Patient reports this is chronic BLE however >>had significant edema after working with PT as described above  -We will continue with diuresis with Lasix IV 40 mg twice daily and monitor closely  -No leukocytosis on lab work therefore will hold off on antibiotic therapy for now.  If suspicious of infection, consider Rocephin therapy Wound care suggestions appreciated Suspect this also represents LE venous insufficiency not just CHF   3. Permanent atrial fibrillation with history of biventricular ICD: -Currently rate control,  60s -Recently started on low-dose carvedilol during last hospitalization  -Continue chronic anticoagulation with Eliquis 5 mg twice daily  4. Hypertension: -Soft, 98/81, 101/82, 114/66 -Hold antihypertensives for now in the setting of diuresis -Continue to monitor closely -Up with assistance only  5.  Hyperlipidemia: -Continue home statin  6. DM2: -Stable, hemoglobin A1c is 7.0 -We will start SSI for glucose control inpatient status    7. Mild dementia: -Continue Aricept  8. Gout: -Continue allopurinol  Jenkins Rouge 12/26/2019, 7:56 AM

## 2019-12-27 LAB — BASIC METABOLIC PANEL
Anion gap: 9 (ref 5–15)
BUN: 31 mg/dL — ABNORMAL HIGH (ref 8–23)
CO2: 32 mmol/L (ref 22–32)
Calcium: 8.7 mg/dL — ABNORMAL LOW (ref 8.9–10.3)
Chloride: 93 mmol/L — ABNORMAL LOW (ref 98–111)
Creatinine, Ser: 1.22 mg/dL (ref 0.61–1.24)
GFR calc Af Amer: >60 mL/min (ref >60)
GFR calc non Af Amer: 53 mL/min — ABNORMAL LOW (ref >60)
Glucose, Bld: 124 mg/dL — ABNORMAL HIGH (ref 70–99)
Potassium: 4.4 mmol/L (ref 3.5–5.1)
Sodium: 134 mmol/L — ABNORMAL LOW (ref 135–145)

## 2019-12-27 MED ORDER — FUROSEMIDE 40 MG PO TABS
40.0000 mg | ORAL_TABLET | Freq: Two times a day (BID) | ORAL | Status: DC
  Administered 2019-12-27 – 2019-12-28 (×4): 40 mg via ORAL
  Filled 2019-12-27 (×4): qty 1

## 2019-12-27 NOTE — Discharge Instructions (Addendum)
Heart Failure Education: 1. Weigh yourself EVERY morning after you go to the bathroom but before you eat or drink anything. Write this number down in a weight log/diary. If you gain 3 pounds overnight or 5 pounds in a week, call the office. 2. Take your medicines as prescribed. If you have concerns about your medications, please call us before you stop taking them.  3. Eat low salt foods--Limit salt (sodium) to 2000 mg per day. This will help prevent your body from holding onto fluid. Read food labels as many processed foods have a lot of sodium, especially canned goods and prepackaged meats. If you would like some assistance choosing low sodium foods, we would be happy to set you up with a nutritionist. 4. Stay as active as you can everyday. Staying active will give you more energy and make your muscles stronger. Start with 5 minutes at a time and work your way up to 30 minutes a day. Break up your activities--do some in the morning and some in the afternoon. Start with 3 days per week and work your way up to 5 days as you can.  If you have chest pain, feel short of breath, dizzy, or lightheaded, STOP. If you don't feel better after a short rest, call 911. If you do feel better, call the office to let us know you have symptoms with exercise. 5. Limit all fluids for the day to less than 2 liters. Fluid includes all drinks, coffee, juice, ice chips, soup, jello, and all other liquids.  Information on my medicine - ELIQUIS (apixaban)  Why was Eliquis prescribed for you? Eliquis was prescribed for you to reduce the risk of a blood clot forming that can cause a stroke if you have a medical condition called atrial fibrillation (a type of irregular heartbeat).  What do You need to know about Eliquis ? Take your Eliquis TWICE DAILY - one tablet in the morning and one tablet in the evening with or without food. If you have difficulty swallowing the tablet whole please discuss with your pharmacist how to  take the medication safely.  Take Eliquis exactly as prescribed by your doctor and DO NOT stop taking Eliquis without talking to the doctor who prescribed the medication.  Stopping may increase your risk of developing a stroke.  Refill your prescription before you run out.  After discharge, you should have regular check-up appointments with your healthcare provider that is prescribing your Eliquis.  In the future your dose may need to be changed if your kidney function or weight changes by a significant amount or as you get older.  What do you do if you miss a dose? If you miss a dose, take it as soon as you remember on the same day and resume taking twice daily.  Do not take more than one dose of ELIQUIS at the same time to make up a missed dose.  Important Safety Information A possible side effect of Eliquis is bleeding. You should call your healthcare provider right away if you experience any of the following: ? Bleeding from an injury or your nose that does not stop. ? Unusual colored urine (red or dark brown) or unusual colored stools (red or black). ? Unusual bruising for unknown reasons. ? A serious fall or if you hit your head (even if there is no bleeding).  Some medicines may interact with Eliquis and might increase your risk of bleeding or clotting while on Eliquis. To help avoid this, consult your   healthcare provider or pharmacist prior to using any new prescription or non-prescription medications, including herbals, vitamins, non-steroidal anti-inflammatory drugs (NSAIDs) and supplements.  This website has more information on Eliquis (apixaban): http://[email or web address].com/eliquis/home 

## 2019-12-27 NOTE — Progress Notes (Signed)
Subjective:   Eating breakfast no complaints   Objective:  Vitals:   12/26/19 0854 12/26/19 1652 12/26/19 2024 12/27/19 0554  BP: 107/81 100/75 106/74 104/66  Pulse:   (!) 58 67  Resp: 20 20 18 18   Temp:   97.6 F (36.4 C) 97.6 F (36.4 C)  TempSrc:   Oral   SpO2: 94% 100% 98% 90%  Weight:    98.2 kg  Height:        Intake/Output from previous day:  Intake/Output Summary (Last 24 hours) at 12/27/2019 0804 Last data filed at 12/27/2019 0500 Gross per 24 hour  Intake 963 ml  Output 1925 ml  Net -962 ml    Physical Exam:  Affect appropriate Elderly white male  HEENT: normal Neck supple with no adenopathy JVP normal no bruits no thyromegaly Lungs clear with no wheezing and good diaphragmatic motion Heart:  S1/S2 no murmur, no rub, gallop or click PMI enlarged AICD under left clavicle  Abdomen: benighn, BS positve, no tenderness, no AAA no bruit.  No HSM or HJR Distal pulses intact with no bruits Plus 1 LE edema legs wrapped  Neuro non-focal Skin warm and dry No muscular weakness   Lab Results: Basic Metabolic Panel: Recent Labs    12/26/19 0500 12/27/19 0533  NA 135 134*  K 3.4* 4.4  CL 95* 93*  CO2 29 32  GLUCOSE 143* 124*  BUN 30* 31*  CREATININE 1.22 1.22  CALCIUM 8.5* 8.7*   Liver Function Tests: No results for input(s): AST, ALT, ALKPHOS, BILITOT, PROT, ALBUMIN in the last 72 hours. No results for input(s): LIPASE, AMYLASE in the last 72 hours. CBC: Recent Labs    12/24/19 1730 12/25/19 0351  WBC 8.0 7.4  NEUTROABS  --  5.2  HGB 13.6 12.5*  HCT 41.5 38.0*  MCV 94.5 94.5  PLT 135* PLATELET CLUMPS NOTED ON SMEAR, UNABLE TO ESTIMATE    Imaging: No results found.  Cardiac Studies:  ECG: afib escape V pacing PVC    Telemetry: afib  12/27/2019   Echo: 12/12/19 EF 30-35%   Medications:   . allopurinol  100 mg Oral QPC breakfast  . apixaban  5 mg Oral BID PC  . atorvastatin  20 mg Oral QPC supper  . donepezil  5 mg Oral QHS  .  furosemide  40 mg Intravenous BID  . magnesium oxide  400 mg Oral BID  . Melatonin  6 mg Oral QHS  . potassium chloride  40 mEq Oral Daily  . sodium chloride flush  3 mL Intravenous Q12H  . sodium chloride flush  3 mL Intravenous Q12H  . tamsulosin  0.4 mg Oral BID PC     . sodium chloride    . sodium chloride      Assessment/Plan:  [employer] a 86 y.o.malewith a historyof normal coronaries on cardiac catheterization in 9798, chronic systolic CHF/non-ischemic cardiomyopathy with EF of 30-35% on echo in 12/2019, s/p BiV ICD placement in 10/2012 which was downgraded to BiV PPM in 07/2016, permanent atrial fibrillation on Eliquis, hypertension, hyperlipidemia, type 2 diabetes mellitus, BPH,who is being admitted to Cardiology service today for CHF.   1. Acute on chronic systolic CHF/nonischemic cardiomyopathy: -improved change to oral diuretics Wound care suggestions applied and legs wrapped Ambulating with PT  K dur added  2.  Bilateral lower extremity edema with weeping wounds/venous stasis: -improved from CHF and chronic venous insufficiency  -No leukocytosis on lab work or signs of cellulitis no  antibiotics needed .  I   3. Permanent atrial fibrillation with history of biventricular ICD: -Currently rate control, 60s -Recently started on low-dose carvedilol during last hospitalization  -Continue chronic anticoagulation with Eliquis 5 mg twice daily  4. Hypertension: -Well controlled.  Continue current medications and low sodium Dash type diet.    5.  Hyperlipidemia: -Continue home statin  6. DM2: -Stable, hemoglobin A1c is 7.0 -We will start SSI for glucose control inpatient status    7. Mild dementia: -Continue Aricept  8. Gout: -Continue allopurinol  Should be ready for d/c home in am   Charlton Haws 12/27/2019, 8:04 AM

## 2019-12-28 LAB — BASIC METABOLIC PANEL
Anion gap: 12 (ref 5–15)
BUN: 36 mg/dL — ABNORMAL HIGH (ref 8–23)
CO2: 26 mmol/L (ref 22–32)
Calcium: 8.7 mg/dL — ABNORMAL LOW (ref 8.9–10.3)
Chloride: 95 mmol/L — ABNORMAL LOW (ref 98–111)
Creatinine, Ser: 1.32 mg/dL — ABNORMAL HIGH (ref 0.61–1.24)
GFR calc Af Amer: 55 mL/min — ABNORMAL LOW (ref >60)
GFR calc non Af Amer: 48 mL/min — ABNORMAL LOW (ref >60)
Glucose, Bld: 134 mg/dL — ABNORMAL HIGH (ref 70–99)
Potassium: 4.4 mmol/L (ref 3.5–5.1)
Sodium: 133 mmol/L — ABNORMAL LOW (ref 135–145)

## 2019-12-28 MED ORDER — METOPROLOL SUCCINATE ER 25 MG PO TB24
25.0000 mg | ORAL_TABLET | Freq: Every day | ORAL | Status: DC
  Administered 2019-12-28 – 2019-12-31 (×4): 25 mg via ORAL
  Filled 2019-12-28 (×4): qty 1

## 2019-12-28 NOTE — Progress Notes (Addendum)
Progress Note  Patient Name: [facility] Date of Encounter: 12/28/2019  Primary Cardiologist: Kirk Ruths, MD   Subjective   No acute overnight events. Patient denies any chest pain and states he thinks breathing is about back to normal. He denies any palpitations, lightheadedness, or dizziness to me but per nursing note, he did complain of some dizziness last night. He expressed frustration of back to back admissions and wants to make sure we have "answers" to his lower extremity edema/weeping before discharge so that he does not end up back here again.  Inpatient Medications    Scheduled Meds: . allopurinol  100 mg Oral QPC breakfast  . apixaban  5 mg Oral BID PC  . atorvastatin  20 mg Oral QPC supper  . donepezil  5 mg Oral QHS  . furosemide  40 mg Oral BID  . magnesium oxide  400 mg Oral BID  . Melatonin  6 mg Oral QHS  . potassium chloride  40 mEq Oral Daily  . sodium chloride flush  3 mL Intravenous Q12H  . sodium chloride flush  3 mL Intravenous Q12H  . tamsulosin  0.4 mg Oral BID PC   Continuous Infusions: . sodium chloride    . sodium chloride     PRN Meds: sodium chloride, sodium chloride, acetaminophen, ondansetron (ZOFRAN) IV, sodium chloride flush, sodium chloride flush   Vital Signs    Vitals:   12/27/19 1847 12/27/19 2015 12/28/19 0543 12/28/19 0549  BP: 116/78 120/85 117/72   Pulse:  (!) 33 81   Resp:  18 18   Temp:  (!) 97.5 F (36.4 C) 98 F (36.7 C)   TempSrc:  Oral    SpO2:  100% 99%   Weight:    98.8 kg  Height:        Intake/Output Summary (Last 24 hours) at 12/28/2019 0816 Last data filed at 12/28/2019 0549 Gross per 24 hour  Intake 480 ml  Output 1200 ml  Net -720 ml   Last 3 Weights 12/28/2019 12/27/2019 12/26/2019  Weight (lbs) 217 lb 12.8 oz 216 lb 6.4 oz 215 lb 3.2 oz  Weight (kg) 98.793 kg 98.158 kg 97.614 kg      Telemetry    V paced with underlying atrial fibrillation and PVC. Rates mostly in the 90's to  110's. - Personally Reviewed  ECG    No new ECG tracing since 12/23/2019. - Personally Reviewed  Physical Exam   GEN: No acute distress.   Neck: Supple. Cardiac: Tachycardic with irregularly irregular rhythm. No murmurs, rubs, or gallops.  Respiratory: Decreased breath sounds in bilateral bases with expiratory wheeze noted. GI: Soft, non-tender, non-distended. Bowel sounds present. MS: Bilateral lower extremities wrapped. No deformity. Skin: Warm and dry. Neuro:  No focal deficits. Psych: Normal affect.  Labs    High Sensitivity Troponin:   Recent Labs  Lab 12/11/19 1338 12/11/19 1638  TROPONINIHS 41* 42*      Chemistry Recent Labs  Lab 12/23/19 2035 12/24/19 1730 12/26/19 0500 12/27/19 0533 12/28/19 0501  NA 138   < > 135 134* 133*  K 3.8   < > 3.4* 4.4 4.4  CL 99   < > 95* 93* 95*  CO2 32   < > 29 32 26  GLUCOSE 158*   < > 143* 124* 134*  BUN 36*   < > 30* 31* 36*  CREATININE 1.33*   < > 1.22 1.22 1.32*  CALCIUM 8.8*   < > 8.5* 8.7*  8.7*  PROT 5.7*  --   --   --   --   ALBUMIN 3.2*  --   --   --   --   AST 41  --   --   --   --   ALT 43  --   --   --   --   ALKPHOS 87  --   --   --   --   BILITOT 1.1  --   --   --   --   GFRNONAA 47*   < > 53* 53* 48*  GFRAA 55*   < > >60 >60 55*  ANIONGAP 7   < > 11 9 12    < > = values in this interval not displayed.     Hematology Recent Labs  Lab 12/23/19 2035 12/24/19 1730 12/25/19 0351  WBC 9.1 8.0 7.4  RBC 4.32 4.39 4.02*  HGB 13.5 13.6 12.5*  HCT 41.8 41.5 38.0*  MCV 96.8 94.5 94.5  MCH 31.3 31.0 31.1  MCHC 32.3 32.8 32.9  RDW 14.5 14.4 14.3  PLT 136* 135* PLATELET CLUMPS NOTED ON SMEAR, UNABLE TO ESTIMATE    BNP Recent Labs  Lab 12/23/19 2035  BNP 788.7*     DDimer No results for input(s): DDIMER in the last 168 hours.   Radiology    No results found.  Cardiac Studies   Echocardiogram 12/12/2019: Impressions:  1. Left ventricular ejection fraction, by visual estimation, is 30 to 35%. The  left ventricle has moderate to severely decreased function. There is no left ventricular hypertrophy.  2. Definity contrast agent was given IV to delineate the left ventricular endocardial borders.  3. Left ventricular diastolic function could not be evaluated.  4. The left ventricle demonstrates global hypokinesis.  5. Global right ventricle has normal systolic function.The right ventricular size is normal. No increase in right ventricular wall thickness.  6. Left atrial size was severely dilated.  7. Right atrial size was severely dilated.  8. The mitral valve is normal in structure. Mild to moderate mitral valve regurgitation. No evidence of mitral stenosis.  9. The tricuspid valve is normal in structure. 10. The aortic valve is normal in structure. Aortic valve regurgitation is not visualized. Mild to moderate aortic valve sclerosis/calcification without any evidence of aortic stenosis. 11. The pulmonic valve was normal in structure. Pulmonic valve regurgitation is trivial. 12. Aortic dilatation noted. 13. There is mild dilatation of the ascending aorta measuring 42 mm. 14. Compared with the echo 02/2018, the ascending aorta has increased from 3.8 cm to 4.2 cm. 15. Moderately elevated pulmonary artery systolic pressure. 16. A pacer wire is visualized. 17. The inferior vena cava is normal in size with greater than 50% respiratory variability, suggesting right atrial pressure of 3 mmHg.  Patient Profile   Mr. Kise is a 86 y.o. male with a history of normal coronaries on cardiac catheterization in 2006, chronic systolic CHF/non-ischemic cardiomyopathy with EF of 30-35% on Echo in 2021, s/p BiV ICD placement in 10/2012 which was downgraded to BiV PPM in 07/2016, permanent atrial fibrillation on Eliquis, hypertension, hyperlipidemia, type 2 diabetes mellitus, BPH who was admitted on 12/23/2019 for acute on chronic CHF.  Assessment & Plan    Acute on Chronic Systolic CHF - BNP 788 on  admission.  - Echo from 1/2/201 showed LVEF of 30-35% with global hypokinesis. - Initially started on IV Lasix but transitioned to PO Lasix 40mg  twice daily yesterday. Documented urine output of 1.2  L in the past 24 hours and net negative 4.8 L since admission. Weight 217 lbs today, down from 225 lbs on admission. Creatinine slightly elevated today. - Continue current dose of PO Lasix. Continue K-Dur daily. - Not currently on beta-blocker or ACEi/ARB. During recent admission, he discharged on Coreg 3.125mg  twice daily and Lisinopril 2.5mg  daily but these are not listed under PTA medications. Sounds like patient has a history of orthostasis. Will discuss adding back beta-blocker with MD given tachycardia. However, BP soft so may need to try Toprol rather than Coreg. - Continue to monitor daily weights, strict I/O's, and renal function.  Bilateral Lower Extremity Edema with Weeping Wounds/Venous Statis - Has improved with diuresis. - No leukocytosis on lab work or signs of cellulitis. No antibiotics needed.  - Wound care consulted on 1/14 and recommendations applied. Legs currently wrapped.  Permanent Atrial Fibrillation s/p BiV ICD - Rates currently in the 90's to 110's. - Consider adding beta-blocker but BP soft at times and patient has history of orthostasis. Consider trying low dose Toprol. Will discuss with MD. - Continue chronic anticoagulation with Eliquis 5mg  twice daily.  Hypertension - BP currently well controlled without any antihypertensive.  Hyperlipidemia - Continue home Lipitor 20mg  daily.  Type 2 Diabetes Mellitus  - Continue SSI during admission. Can restart home medications on discharge.  Mild Dementia - Continue Aricept.  For questions or updates, please contact [facility] Please consult [email or web address] for contact info under        Signed, , PA-C  12/28/2019, 8:16 AM    Patient seen and examined.  Agree with above documentation.  Patient  reports dyspnea has improved.  He is apprehensive about discharge, states that he is concerned he will be right back in the hospital.   On exam, patient is alert and oriented, regular rate and rhythm, no murmurs, lungs CTAB, legs are wrapped, + JVD.  Switched to PO lasix yesterday, was net negative 720cc (-4.9L on admission), Cr 1.2->1.3.  Telemetry personally reviewed, shows AF with BiV paced rhythm, rates 90-110s.  Vpacing was only 65% on recent device interrogation.  Worsening heart failure may be due to inadequate BiV pacing, will start on toprol XL 25 mg daily to decrease rates and hopefully improved BiV pacing.  Continue PO lasix.  Planning discharge home tomorrow with daughter and close f/u with Dr Corrin Parker.

## 2019-12-28 NOTE — Consult Note (Signed)
   [facility] CM Inpatient Consult   12/28/2019  [employer] Welles [birth year] [phone]   Patient screened for high risk score for unplanned readmission with less than 7 days re- hospitalization. Review of patient's medical record reveals patient is readmitted with Acute exacerbation of Congestive Heart Failure on 12/23/2019 and previously on 12/11/2019.[name]  Primary Care Provider is  Wynn Banker, MD with [facility], this office is listed to provide the transition of care follow up.   Plan: Follow up with inpatient [facility] team for needs.  Continue to follow progress and disposition to assess for post hospital care management needs.    Please place a [facility] Care Management consult as appropriate and for questions contact:   Charlesetta Shanks, RN BSN CCM [facility]  [phone] business mobile phone Toll free office [phone]  Fax number: [phone] [email or web address]

## 2019-12-28 NOTE — TOC Initial Note (Signed)
Transition of Care [facility]) - Initial/Assessment Note    Patient Details  Name: [name] MRN: [id number] Date of Birth: 03/08/1929  Transition of Care [facility]) CM/SW Contact:    Leone Haven, RN Phone Number: 12/28/2019, 5:24 PM  Clinical Narrative:                 Patient is set up with [facility] for HHRN, HHPT.  TOC will cont to follow .  Expected Discharge Plan: Home w Home Health Services Barriers to Discharge: Continued Medical Work up   Patient Goals and CMS Choice Patient states their goals for this hospitalization and ongoing recovery are:: go home CMS Medicare.gov Compare Post Acute Care list provided to:: Patient    Expected Discharge Plan and Services Expected Discharge Plan: Home w Home Health Services   Discharge Planning Services: CM Consult Post Acute Care Choice: Home Health Living arrangements for the past 2 months: Single Family Home                 DME Arranged: (NA)         HH Arranged: RN, PT HH Agency: [facility]        Prior Living Arrangements/Services Living arrangements for the past 2 months: Single Family Home   Patient language and need for interpreter reviewed:: Yes        Need for Family Participation in Patient Care: Yes (Comment) Care giver support system in place?: Yes (comment) Current home services: Home PT, Home RN Criminal Activity/Legal Involvement Pertinent to Current Situation/Hospitalization: No - Comment as needed  Activities of Daily Living Home Assistive Devices/Equipment: Environmental consultant (specify type), Cane (specify quad or straight) ADL Screening (condition at time of admission) Patient's cognitive ability adequate to safely complete daily activities?: Yes Is the patient deaf or have difficulty hearing?: No Does the patient have difficulty seeing, even when wearing glasses/contacts?: No Does the patient have difficulty concentrating, remembering, or making decisions?:  No Patient able to express need for assistance with ADLs?: Yes Does the patient have difficulty dressing or bathing?: Yes Independently performs ADLs?: Yes (appropriate for developmental age) Does the patient have difficulty walking or climbing stairs?: Yes Weakness of Legs: Both Weakness of Arms/Hands: Both  Permission Sought/Granted                  Emotional Assessment       Orientation: : Oriented to Self, Oriented to Place, Oriented to  Time, Oriented to Situation      Admission diagnosis:  CHF (congestive heart failure) (HCC) [I50.9] Acute exacerbation of CHF (congestive heart failure) (HCC) [I50.9] Acute on chronic systolic congestive heart failure (HCC) [I50.23] Patient Active Problem List   Diagnosis Date Noted  . Acute exacerbation of CHF (congestive heart failure) (HCC) 12/24/2019  . CHF (congestive heart failure) (HCC) 12/24/2019  . Acute on chronic combined systolic and diastolic CHF (congestive heart failure) (HCC) 12/11/2019  . 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 Bigeminy 02/23/2018  . Obesity   . OA (osteoarthritis)   . Hyperlipidemia   . History of colonoscopy   . GERD (gastroesophageal reflux disease)   . Bradycardia   . BPH (benign prostatic hyperplasia)   . Hearing loss 01/11/2017  . Vitamin D deficiency 06/29/2015  . Systolic CHF, chronic (HCC) 02/21/2015  . S/P left TKA 02/15/2015  . S/P knee replacement 02/15/2015  . Encounter for therapeutic drug monitoring 03/31/2014  . Automatic implantable cardioverter-defibrillator in situ 10/16/2012  . Disorder of liver  04/14/2012  . Screening for prostate cancer 04/11/2012  . Encounter for long-term (current) use of other medications 04/11/2012  . Anticoagulated 02/25/2012  . DM (diabetes mellitus) type II controlled with renal manifestation ([place]) 10/18/2010  . MEMORY LOSS 08/30/2010  . HYPOKALEMIA 07/31/2010  . Thrombocytopenia ([place]) 06/20/2009  . Hyperuricemia 06/20/2009  . NICM (nonischemic  cardiomyopathy) ([place]) 04/29/2009  . Dyslipidemia 02/21/2009  . BRADYCARDIA 02/21/2009  . CHRON/UNSPEC PEPTC ULCER UNSPEC SITE W/PERF&OBST 02/21/2009  . Edema 02/21/2009  . Nephropathy, diabetic ([place]) 03/16/2008  . Atrial fibrillation ([place]) 03/16/2008  . Anemia, iron deficiency 08/09/2007  . Essential hypertension 08/09/2007  . MYOCARDIAL INFARCTION, HX OF 08/09/2007   PCP:  Caren Macadam, MD Pharmacy:   [facility], [place] [address] Phone: [phone] Fax: [phone]     Social Determinants of Health (SDOH) Interventions    Readmission Risk Interventions Readmission Risk Prevention Plan 12/28/2019  Transportation Screening Complete  PCP or Specialist Appt within 3-5 Days Complete  HRI or [place] Complete  Social Work Consult for [place] Planning/Counseling Complete  Palliative Care Screening Not Applicable  Medication Review Press photographer) Complete  Some recent data might be hidden

## 2019-12-28 NOTE — Evaluation (Signed)
Physical Therapy Evaluation Patient Details Name: Kelcy Baeten MRN: [id number] DOB: 1929/08/22 Today's Date: 12/28/2019   History of Present Illness  Patient is a 86 y/o male who presents with Acute on chronic systolic CHF/nonischemic cardiomyopathy. PMh includes systolic dysfunction CHF, nonischemic cardiomyopathy, pacemaker placement, HTN, DM, HLD, frequent PVCs.  Clinical Impression  Patient presents with impaired balance, decreased activity tolerance and impaired mobility s/p above. Pt perseverating about wanting to talk to his PA and/or Dr. Jens Som together "to figure all this mess out!" Pt reports living alone and Mod I with SPC PTA. Pt distracted and upset about being back in the hospital. Today, pt tolerated bed mobility, transfers and gait training with min guard assist for balance/safety and use of SPC. HR increased with activity to 120s. Pt with poor awareness/understanding of chronic illness relating to heart failure despite explanation. "this is pointless if I have to keep coming back." Will follow acutely to maximize independence and mobility prior to return home.    Follow Up Recommendations Home health PT;Supervision for mobility/OOB    Equipment Recommendations  None recommended by PT    Recommendations for Other Services       Precautions / Restrictions Precautions Precautions: Other (comment);Fall Precaution Comments: watch HR Restrictions Weight Bearing Restrictions: No      Mobility  Bed Mobility Overal bed mobility: Needs Assistance Bed Mobility: Supine to Sit     Supine to sit: Min guard;HOB elevated Sit to supine: Min guard;HOB elevated   General bed mobility comments: minguard for safety; pt with increassed time and effort  Transfers Overall transfer level: Needs assistance Equipment used: Straight cane Transfers: Sit to/from Stand Sit to Stand: Min guard         General transfer comment: Min guard for safety. Slow to rise. "this is  such a waste of time, right now."  Ambulation/Gait Ambulation/Gait assistance: Min guard Gait Distance (Feet): 70 Feet Assistive device: Straight cane Gait Pattern/deviations: Step-to pattern;Decreased stride length;Trunk flexed;Wide base of support Gait velocity: decreased   General Gait Details: Grossly Min guard for safety with wide BoS and use of SPC, declined use of RW. HR increased during mobility. Upset about being in the hospital.  Stairs            Wheelchair Mobility    Modified Rankin (Stroke Patients Only)       Balance Overall balance assessment: Needs assistance Sitting-balance support: Feet supported;No upper extremity supported Sitting balance-Leahy Scale: Fair     Standing balance support: During functional activity Standing balance-Leahy Scale: Fair Standing balance comment: Requires UE support in standing and dynamically.                             Pertinent Vitals/Pain Pain Assessment: No/denies pain    Home Living Family/patient expects to be discharged to:: Private residence Living Arrangements: Alone Available Help at Discharge: Family;Available PRN/intermittently Type of Home: House Home Access: Stairs to enter Entrance Stairs-Rails: Doctor, general practice of Steps: 6 Home Layout: One level Home Equipment: Walker - 2 wheels;Cane - single point;Bedside commode Additional Comments: sleeps in recliner;family able to assist daily for 2-3 hours at a time    Prior Function Level of Independence: Independent with assistive device(s)         Comments: Amb with cane, does own ADLs.     Hand Dominance   Dominant Hand: Right    Extremity/Trunk Assessment   Upper Extremity Assessment Upper Extremity Assessment: Defer to  OT evaluation    Lower Extremity Assessment Lower Extremity Assessment: Generalized weakness(but functional)    Cervical / Trunk Assessment Cervical / Trunk Assessment: Kyphotic   Communication   Communication: HOH  Cognition Arousal/Alertness: Awake/alert Behavior During Therapy: WFL for tasks assessed/performed Overall Cognitive Status: Impaired/Different from baseline Area of Impairment: Safety/judgement;Awareness                         Safety/Judgement: Decreased awareness of safety;Decreased awareness of deficits Awareness: Intellectual   General Comments: perseverating about wanting to take to Dr. [name] and his PA as "I need to talk to the PA, they need to get together cause I cannot keep doing this all the time, coming to the hospital and back and forth." "they need to figure out what is happening, nothing is the truth" Pt with poor awareness of chronic medical conditions and ongoing need for care despite explanation.      General Comments General comments (skin integrity, edema, etc.): HR in 120s during session.    Exercises     Assessment/Plan    PT Assessment Patient needs continued PT services  PT Problem List Decreased balance;Decreased mobility;Decreased activity tolerance;Decreased safety awareness;Decreased cognition;Cardiopulmonary status limiting activity;Decreased skin integrity       PT Treatment Interventions DME instruction;Gait training;Functional mobility training;Stair training;Therapeutic exercise;Therapeutic activities;Patient/family education;Balance training    PT Goals (Current goals can be found in the Care Plan section)  Acute Rehab PT Goals Patient Stated Goal: to talk to my PA! PT Goal Formulation: With patient Time For Goal Achievement: 01/11/20 Potential to Achieve Goals: Good    Frequency Min 3X/week   Barriers to discharge Decreased caregiver support lives alone    Co-evaluation               AM-PAC PT "6 Clicks" Mobility  Outcome Measure Help needed turning from your back to your side while in a flat bed without using bedrails?: A Little Help needed moving from lying on your back to  sitting on the side of a flat bed without using bedrails?: A Little Help needed moving to and from a bed to a chair (including a wheelchair)?: A Little Help needed standing up from a chair using your arms (e.g., wheelchair or bedside chair)?: A Little Help needed to walk in hospital room?: A Little Help needed climbing 3-5 steps with a railing? : A Little 6 Click Score: 18    End of Session Equipment Utilized During Treatment: Gait belt Activity Tolerance: Patient tolerated treatment well Patient left: in bed;with call bell/phone within reach;with bed alarm set;Other (comment)(PA present) Nurse Communication: Mobility status PT Visit Diagnosis: Unsteadiness on feet (R26.81);Difficulty in walking, not elsewhere classified (R26.2)    Time: 2330-0762 PT Time Calculation (min) (ACUTE ONLY): 20 min   Charges:   PT Evaluation $PT Eval Moderate Complexity: 1 Mod          Marisa Severin, PT, DPT Acute Rehabilitation Services Pager [phone] Office [phone]      Marguarite Arbour A Dalayza Zambrana 12/28/2019, 11:20 AM

## 2019-12-29 DIAGNOSIS — N179 Acute kidney failure, unspecified: Secondary | ICD-10-CM

## 2019-12-29 LAB — CBC
HCT: 40.6 % (ref 39.0–52.0)
Hemoglobin: 13.5 g/dL (ref 13.0–17.0)
MCH: 31.3 pg (ref 26.0–34.0)
MCHC: 33.3 g/dL (ref 30.0–36.0)
MCV: 94 fL (ref 80.0–100.0)
Platelets: 105 10*3/uL — ABNORMAL LOW (ref 150–400)
RBC: 4.32 MIL/uL (ref 4.22–5.81)
RDW: 14.2 % (ref 11.5–15.5)
WBC: 6.5 10*3/uL (ref 4.0–10.5)
nRBC: 0 % (ref 0.0–0.2)

## 2019-12-29 LAB — BASIC METABOLIC PANEL
Anion gap: 12 (ref 5–15)
BUN: 41 mg/dL — ABNORMAL HIGH (ref 8–23)
CO2: 27 mmol/L (ref 22–32)
Calcium: 8.8 mg/dL — ABNORMAL LOW (ref 8.9–10.3)
Chloride: 96 mmol/L — ABNORMAL LOW (ref 98–111)
Creatinine, Ser: 1.42 mg/dL — ABNORMAL HIGH (ref 0.61–1.24)
GFR calc Af Amer: 51 mL/min — ABNORMAL LOW (ref >60)
GFR calc non Af Amer: 44 mL/min — ABNORMAL LOW (ref >60)
Glucose, Bld: 144 mg/dL — ABNORMAL HIGH (ref 70–99)
Potassium: 4.7 mmol/L (ref 3.5–5.1)
Sodium: 135 mmol/L (ref 135–145)

## 2019-12-29 MED ORDER — ZOLPIDEM TARTRATE 5 MG PO TABS
5.0000 mg | ORAL_TABLET | Freq: Every evening | ORAL | Status: DC | PRN
  Administered 2019-12-29: 5 mg via ORAL
  Filled 2019-12-29: qty 1

## 2019-12-29 MED ORDER — DOCUSATE SODIUM 100 MG PO CAPS
100.0000 mg | ORAL_CAPSULE | Freq: Two times a day (BID) | ORAL | Status: DC
  Administered 2019-12-29 – 2020-01-17 (×38): 100 mg via ORAL
  Filled 2019-12-29 (×38): qty 1

## 2019-12-29 NOTE — Progress Notes (Signed)
Physical Therapy Treatment Patient Details Name: [name] MRN: [id number] DOB: 14-Mar-1929 Today's Date: 12/29/2019    History of Present Illness Patient is a 86 y/o male who presents with Acute on chronic systolic CHF/nonischemic cardiomyopathy. PMh includes systolic dysfunction CHF, nonischemic cardiomyopathy, pacemaker placement, HTN, DM, HLD, frequent PVCs.    PT Comments    Pt progressing with functional mobility today, able to participate in dynamic balance and strengthening exercises. Pt transfers to sitting EOB with min guard assist. Min guard for ambulation with SPC x 40 ft today, min assist needed for balance challenge (ambulating backwards with SPC). Pt instructed in sit<>stands, seated marches and LAQ for LE strengthening today. Will continue to follow acutely to progress functional mobility and balance. Continue to recommend home health follow up therapy to maximize independence with mobility within the home.   Follow Up Recommendations  Home health PT;Supervision for mobility/OOB     Equipment Recommendations  None recommended by PT    Recommendations for Other Services       Precautions / Restrictions Precautions Precautions: Other (comment);Fall Precaution Comments: watch HR Restrictions Weight Bearing Restrictions: No    Mobility  Bed Mobility Overal bed mobility: Needs Assistance Bed Mobility: Supine to Sit     Supine to sit: Min guard;HOB elevated     General bed mobility comments: minguard for safety and LE management; pt with increassed time and effort  Transfers Overall transfer level: Needs assistance Equipment used: Straight cane Transfers: Sit to/from Stand Sit to Stand: Min guard         General transfer comment: Min guard for safety. Performed multiple sit<>stands today from EOB for strengthening, min guard  Ambulation/Gait Ambulation/Gait assistance: Min guard Gait Distance (Feet): 40 Feet Assistive device: Straight  cane Gait Pattern/deviations: Step-to pattern;Decreased stride length;Trunk flexed;Wide base of support Gait velocity: decreased   General Gait Details: Grossly Min guard for safety with wide BoS and use of SPC   Stairs             Wheelchair Mobility    Modified Rankin (Stroke Patients Only)       Balance Overall balance assessment: Needs assistance Sitting-balance support: Feet supported;No upper extremity supported Sitting balance-Leahy Scale: Fair     Standing balance support: During functional activity Standing balance-Leahy Scale: Fair Standing balance comment: Requires UE support in standing and dynamically.               High Level Balance Comments: Pt performed backwards ambulation today working on balance x 10 ft with min assist needed, using SPC            Cognition Arousal/Alertness: Awake/alert Behavior During Therapy: WFL for tasks assessed/performed Overall Cognitive Status: Impaired/Different from baseline Area of Impairment: Safety/judgement;Awareness                         Safety/Judgement: Decreased awareness of safety;Decreased awareness of deficits Awareness: Intellectual   General Comments: Pt very pleasant and eager to participate in therapy today, "this is what I need to get stronger." Continues with decreased safety awareness with mobility.      Exercises Other Exercises Other Exercises: Pt insturcted in sit<>stands 2 x 5 for strengthening today from EOB, cues for techniques. Seated in recliner pt performed x 10 LAQ per LE and x 10 hip flexion marches per LE- instructed for HEP    General Comments        Pertinent Vitals/Pain Pain Assessment: No/denies pain  Home Living                      Prior Function            PT Goals (current goals can now be found in the care plan section) Progress towards PT goals: Progressing toward goals    Frequency    Min 3X/week      PT Plan Current plan  remains appropriate    Co-evaluation              AM-PAC PT "6 Clicks" Mobility   Outcome Measure  Help needed turning from your back to your side while in a flat bed without using bedrails?: A Little Help needed moving from lying on your back to sitting on the side of a flat bed without using bedrails?: A Little Help needed moving to and from a bed to a chair (including a wheelchair)?: A Little Help needed standing up from a chair using your arms (e.g., wheelchair or bedside chair)?: A Little Help needed to walk in hospital room?: A Little Help needed climbing 3-5 steps with a railing? : A Little 6 Click Score: 18    End of Session Equipment Utilized During Treatment: Gait belt Activity Tolerance: Patient tolerated treatment well Patient left: in chair;with chair alarm set;with call bell/phone within reach Nurse Communication: Mobility status PT Visit Diagnosis: Unsteadiness on feet (R26.81);Difficulty in walking, not elsewhere classified (R26.2)     Time: 0092-3300 PT Time Calculation (min) (ACUTE ONLY): 16 min  Charges:  $Therapeutic Activity: 8-22 mins                     Netta Corrigan, PT, DPT, CSRS Acute Rehab [place] 12/29/2019, 11:08 AM

## 2019-12-29 NOTE — Progress Notes (Signed)
Pt spouse at bedside requested for pt to have stool softener scheduled and requested for "stronger medication to help him sleep, because melatonin doesn't help him". Questions and concerns were answered. Wound care was completed this morning, pt's spouse stated that she wants dressing changed tomorrow before discharge. PA paged.  Pt is currently in bed with eyes close.  Call bell within reach.

## 2019-12-29 NOTE — Progress Notes (Addendum)
Progress Note  Patient Name: [name] Date of Encounter: 12/29/2019  Primary Cardiologist: Olga Millers, MD   Subjective   No acute overnight events. Patient thinks he is breathing better and back to his baseline. No chest pain, palpitations, lightheadedness, or dizziness. He does report he thinks the wound on his right leg is getting worse.  Inpatient Medications    Scheduled Meds: . allopurinol  100 mg Oral QPC breakfast  . apixaban  5 mg Oral BID PC  . atorvastatin  20 mg Oral QPC supper  . donepezil  5 mg Oral QHS  . furosemide  40 mg Oral BID  . magnesium oxide  400 mg Oral BID  . Melatonin  6 mg Oral QHS  . metoprolol succinate  25 mg Oral Daily  . potassium chloride  40 mEq Oral Daily  . sodium chloride flush  3 mL Intravenous Q12H  . sodium chloride flush  3 mL Intravenous Q12H  . tamsulosin  0.4 mg Oral BID PC   Continuous Infusions: . sodium chloride    . sodium chloride     PRN Meds: sodium chloride, sodium chloride, acetaminophen, ondansetron (ZOFRAN) IV, sodium chloride flush, sodium chloride flush   Vital Signs    Vitals:   12/28/19 1217 12/28/19 1219 12/28/19 2042 12/29/19 0509  BP: 100/71 113/68 104/85 110/68  Pulse: 98 80 70 61  Resp: 18 18 18 18   Temp: 98 F (36.7 C) 98.2 F (36.8 C) 98.6 F (37 C) (!) 97.5 F (36.4 C)  TempSrc: Oral Oral  Oral  SpO2: 99% 100% 95% 97%  Weight:    98.7 kg  Height:        Intake/Output Summary (Last 24 hours) at 12/29/2019 0715 Last data filed at 12/29/2019 0515 Gross per 24 hour  Intake 480 ml  Output 725 ml  Net -245 ml   Last 3 Weights 12/29/2019 12/28/2019 12/27/2019  Weight (lbs) 217 lb 11.2 oz 217 lb 12.8 oz 216 lb 6.4 oz  Weight (kg) 98.748 kg 98.793 kg 98.158 kg      Telemetry    Atrial fibrillation with intermittent ventricular pacing and PVCs. Rates better controlled today in the 70's to 90's. - Personally Reviewed  ECG    No new ECG tracing since 12/23/2019. - Personally  Reviewed  Physical Exam   GEN: No acute distress.   Neck: Supple. JVD possibly elevated. Cardiac: Irregularly irregular rhythm with normal rate. Possible soft murmur at apex. No rubs or gallops. Respiratory: No significantly increased work of breathing. Faint crackles noted in bases (possible atelectasis) and occasional expiratory wheeze. GI: Soft, nontender, non-distended. Bowel sounds present. MS: Bilateral lower extremities wrapped. No deformity. Neuro:  No focal deficits. Psych: Normal affect.  Labs    High Sensitivity Troponin:   Recent Labs  Lab 12/11/19 1338 12/11/19 1638  TROPONINIHS 41* 42*      Chemistry Recent Labs  Lab 12/23/19 2035 12/24/19 1730 12/27/19 0533 12/28/19 0501 12/29/19 0516  NA 138   < > 134* 133* 135  K 3.8   < > 4.4 4.4 4.7  CL 99   < > 93* 95* 96*  CO2 32   < > 32 26 27  GLUCOSE 158*   < > 124* 134* 144*  BUN 36*   < > 31* 36* 41*  CREATININE 1.33*   < > 1.22 1.32* 1.42*  CALCIUM 8.8*   < > 8.7* 8.7* 8.8*  PROT 5.7*  --   --   --   --  ALBUMIN 3.2*  --   --   --   --   AST 41  --   --   --   --   ALT 43  --   --   --   --   ALKPHOS 87  --   --   --   --   BILITOT 1.1  --   --   --   --   GFRNONAA 47*   < > 53* 48* 44*  GFRAA 55*   < > >60 55* 51*  ANIONGAP 7   < > 9 12 12    < > = values in this interval not displayed.     Hematology Recent Labs  Lab 12/23/19 2035 12/24/19 1730 12/25/19 0351  WBC 9.1 8.0 7.4  RBC 4.32 4.39 4.02*  HGB 13.5 13.6 12.5*  HCT 41.8 41.5 38.0*  MCV 96.8 94.5 94.5  MCH 31.3 31.0 31.1  MCHC 32.3 32.8 32.9  RDW 14.5 14.4 14.3  PLT 136* 135* PLATELET CLUMPS NOTED ON SMEAR, UNABLE TO ESTIMATE    BNP Recent Labs  Lab 12/23/19 2035  BNP 788.7*     DDimer No results for input(s): DDIMER in the last 168 hours.   Radiology    No results found.  Cardiac Studies   Echocardiogram 12/12/2019: Impressions: 1. Left ventricular ejection fraction, by visual estimation, is 30 to 35%. The left  ventricle has moderate to severely decreased function. There is no left ventricular hypertrophy. 2. Definity contrast agent was given IV to delineate the left ventricular endocardial borders. 3. Left ventricular diastolic function could not be evaluated. 4. The left ventricle demonstrates global hypokinesis. 5. Global right ventricle has normal systolic function.The right ventricular size is normal. No increase in right ventricular wall thickness. 6. Left atrial size was severely dilated. 7. Right atrial size was severely dilated. 8. The mitral valve is normal in structure. Mild to moderate mitral valve regurgitation. No evidence of mitral stenosis. 9. The tricuspid valve is normal in structure. 10. The aortic valve is normal in structure. Aortic valve regurgitation is not visualized. Mild to moderate aortic valve sclerosis/calcification without any evidence of aortic stenosis. 11. The pulmonic valve was normal in structure. Pulmonic valve regurgitation is trivial. 12. Aortic dilatation noted. 13. There is mild dilatation of the ascending aorta measuring 42 mm. 14. Compared with the echo 02/2018, the ascending aorta has increased from 3.8 cm to 4.2 cm. 15. Moderately elevated pulmonary artery systolic pressure. 16. A pacer wire is visualized. 17. The inferior vena cava is normal in size with greater than 50% respiratory variability, suggesting right atrial pressure of 3 mmHg.  Patient Profile     [name] is a 86 y.o. male with a history of normal coronaries on cardiac catheterization in 2006, chronic systolic CHF/non-ischemic cardiomyopathy with EF of 30-35% on Echo in 2021, s/p BiV ICD placement in 10/2012 which was downgraded to BiV PPM in 07/2016, permanent atrial fibrillation on Eliquis, hypertension, hyperlipidemia, type 2 diabetes mellitus, BPH who was admitted on 12/23/2019 for acute on chronic CHF.  Assessment & Plan    Acute on Chronic Systolic CHF - BNP 788 on admission.    - Echo from 1/2/201 showed LVEF of 30-35% with global hypokinesis. - Initially started on IV Lasix but transitioned to PO Lasix 40mg  twice daily yesterday. Documented urine output of 1.2 L in the past 24 hours and net negative 4.8 L since admission. Weight 217 lbs today, down from 225 lbs on admission.  Creatinine still rising slowly at 1.42.  - Will hold today's dose of Lasix and K-Dur give rise in creatinine. - Toprol XL 25mg  added yesterday for heart rate control. Continue. - During recent admission, he discharged on Coreg 3.125mg  twice daily and Lisinopril 2.5mg  daily but these were discontinued due to hypotension. BP still soft so will not add ACEi/ARB now. Could consider adding as outpatient if BP allows.  - Continue to monitor daily weights, strict I/O's, and renal function.  Bilateral Lower Extremity Edema with Weeping Wounds/Venous Statis - Has improved with diuresis. - No leukocytosis on lab work or signs of cellulitis. No antibiotics needed.  - Wound care consulted on 1/14 and recommendations applied. Legs currently wrapped. - Will ask Wound Care to see again to give recommendations for discharge.  Permanent Atrial Fibrillation s/p BiV ICD - Rates better controlled in the 70's to 90's after addition of Toprol yesterday. - Continue Toprol-XL 25mg  daily. - Continue chronic anticoagulation with Eliquis 5mg  twice daily.  Hypertension - BP currently well controlled but soft at times. - Continue Toprol-XL 25mg  daily.  Hyperlipidemia - Continue home Lipitor 20mg  daily.  Type 2 Diabetes Mellitus  - Continue SSI during admission. Can restart home medications on discharge.  Mild Dementia - Continue Aricept.  For questions or updates, please contact [name] Please consult [email or web address] for contact info under        Signed, Steven Mclean, PA-C  12/29/2019, 7:15 AM    Patient seen and examined.  Agree with above documentation.  He reports dyspnea has improved.   On exam, patient is alert and oriented, irregular rhythm, no murmurs, lungs CTAB, legs are wrapped, no JVD.  Telemetry reviewed, shows AF with paced rhythm, rates 90-100s.  Bump in Cr today (1.2->1.3->1.4), will hold lasix and plan to restart tomorrow if stable.  Steven Heinz, MD

## 2019-12-29 NOTE — Plan of Care (Signed)
  Problem: Education: Goal: Ability to verbalize understanding of medication therapies will improve Outcome: Progressing Goal: Individualized Educational Video(s) Outcome: Progressing   Problem: Education: Goal: Ability to demonstrate management of disease process will improve Outcome: Adequate for Discharge   Problem: Activity: Goal: Capacity to carry out activities will improve Outcome: Adequate for Discharge   Problem: Cardiac: Goal: Ability to achieve and maintain adequate cardiopulmonary perfusion will improve Outcome: Adequate for Discharge   Problem: Education: Goal: Knowledge of General Education information will improve Description: Including pain rating scale, medication(s)/side effects and non-pharmacologic comfort measures Outcome: Adequate for Discharge   Problem: Health Behavior/Discharge Planning: Goal: Ability to manage health-related needs will improve Outcome: Adequate for Discharge   Problem: Clinical Measurements: Goal: Ability to maintain clinical measurements within normal limits will improve Outcome: Adequate for Discharge Goal: Will remain free from infection Outcome: Adequate for Discharge Goal: Diagnostic test results will improve Outcome: Adequate for Discharge Goal: Respiratory complications will improve Outcome: Adequate for Discharge Goal: Cardiovascular complication will be avoided Outcome: Adequate for Discharge   Problem: Activity: Goal: Risk for activity intolerance will decrease Outcome: Adequate for Discharge   Problem: Nutrition: Goal: Adequate nutrition will be maintained Outcome: Adequate for Discharge   Problem: Coping: Goal: Level of anxiety will decrease Outcome: Adequate for Discharge   Problem: Elimination: Goal: Will not experience complications related to bowel motility Outcome: Adequate for Discharge Goal: Will not experience complications related to urinary retention Outcome: Adequate for Discharge   Problem:  Pain Managment: Goal: General experience of comfort will improve Outcome: Adequate for Discharge   Problem: Safety: Goal: Ability to remain free from injury will improve Outcome: Adequate for Discharge   Problem: Skin Integrity: Goal: Risk for impaired skin integrity will decrease Outcome: Adequate for Discharge

## 2019-12-29 NOTE — Care Management Important Message (Signed)
Important Message  Patient Details  Name: [name] MRN: [id number] Date of Birth: 02/19/1929   Medicare Important Message Given:  Yes     Renie Ora 12/29/2019, 12:43 PM

## 2019-12-29 NOTE — Consult Note (Signed)
WOC Nurse wound follow up I received a page to call [phone] about this patient. I immediately called the number, but no one answered.  I left a message to page me again if I am needed. Helmut Muster, RN, MSN, CWOCN, CNS-BC, pager [phone]

## 2019-12-29 NOTE — Consult Note (Signed)
WOC Nurse Consult Note: Reason for Consult: Bilateral edema to lower legs and ruptured serum filled blister to right lower leg.  Will keep modified light compression in place until Thursday.   Wound type: inflammatory  Pressure Injury POA: NA Measurement: 6 cm x 5 cm x 0.2 cm  Wound GZQ:JSID pink and moist  Drainage (amount, consistency, odor) moderate serosanguinous  No odor.  Periwound:Edema to both legs.  Dressing procedure/placement/frequency: Wound care to begin Thursday:  Wash legs with soap and water and pat dry.  Apply Xeroform gauze to ruptured blister.  Wrap legs from below toes to below knee with kerlix. Secure with ace wraps.  Change every other day.   Patient would benefit from removable compression garments once discharge, may follow up with cardiology.  Will not follow at this time.  Please re-consult if needed.  Maple Hudson MSN, RN, FNP-BC CWON Wound, Ostomy, Continence Nurse Pager [phone]

## 2019-12-30 LAB — BASIC METABOLIC PANEL
Anion gap: 12 (ref 5–15)
BUN: 47 mg/dL — ABNORMAL HIGH (ref 8–23)
CO2: 28 mmol/L (ref 22–32)
Calcium: 8.9 mg/dL (ref 8.9–10.3)
Chloride: 89 mmol/L — ABNORMAL LOW (ref 98–111)
Creatinine, Ser: 1.6 mg/dL — ABNORMAL HIGH (ref 0.61–1.24)
GFR calc Af Amer: 44 mL/min — ABNORMAL LOW (ref >60)
GFR calc non Af Amer: 38 mL/min — ABNORMAL LOW (ref >60)
Glucose, Bld: 148 mg/dL — ABNORMAL HIGH (ref 70–99)
Potassium: 4.8 mmol/L (ref 3.5–5.1)
Sodium: 129 mmol/L — ABNORMAL LOW (ref 135–145)

## 2019-12-30 LAB — GLUCOSE, CAPILLARY: Glucose-Capillary: 172 mg/dL — ABNORMAL HIGH (ref 70–99)

## 2019-12-30 LAB — BRAIN NATRIURETIC PEPTIDE: B Natriuretic Peptide: 1321.1 pg/mL — ABNORMAL HIGH (ref 0.0–100.0)

## 2019-12-30 MED ORDER — FUROSEMIDE 10 MG/ML IJ SOLN
40.0000 mg | Freq: Two times a day (BID) | INTRAMUSCULAR | Status: DC
  Administered 2019-12-30 – 2020-01-01 (×6): 40 mg via INTRAVENOUS
  Filled 2019-12-30 (×7): qty 4

## 2019-12-30 NOTE — Progress Notes (Deleted)
HPI: FU nonischemic cardiomyopathy, CHF as well as atrial fibrillation. Note, he had a catheterization in January 2006 that showed no coronary artery disease and an ejection fraction of 40%. He had a Myoview performed last on May 04, 2008, that showed an ejection fraction of 31%. There was felt to be a prior inferior infarct with mild peri-infarct ischemia. I did review this and felt there was a low risk and we have been treating medically. He does have permanent atrial fibrillation as well. Holter monitor in August of 2011 showed mildly reduced rate and we decreased his Coreg. In November of 2013 the patient had a biventricular ICD placed; downgraded to BIV pacemaker 8/17.  Last echocardiogram January 2021 showed ejection fraction 30 to 35%, severe biatrial enlargement, mild to moderate mitral regurgitation and mildly dilated ascending aorta measuring 43 mm. Recently admitted twice with congestive heart failure requiring IV diuresis.  Since I last saw him,  No current facility-administered medications for this visit.   No current outpatient medications on file.   Facility-Administered Medications Ordered in Other Visits  Medication Dose Route Frequency Provider Last Rate Last Admin  . 0.9 %  sodium chloride infusion  250 mL Intravenous PRN Georgie Chard D, NP      . 0.9 %  sodium chloride infusion  250 mL Intravenous PRN Georgie Chard D, NP      . acetaminophen (TYLENOL) tablet 650 mg  650 mg Oral Q4H PRN Georgie Chard D, NP   650 mg at 12/26/19 1026  . allopurinol (ZYLOPRIM) tablet 100 mg  100 mg Oral QPC breakfast Georgie Chard D, NP   100 mg at 12/30/19 0956  . apixaban (ELIQUIS) tablet 5 mg  5 mg Oral BID PC Georgie Chard D, NP   5 mg at 12/30/19 0956  . atorvastatin (LIPITOR) tablet 20 mg  20 mg Oral QPC supper Georgie Chard D, NP   20 mg at 12/29/19 1812  . docusate sodium (COLACE) capsule 100 mg  100 mg Oral BID Barrett, Rhonda G, PA-C   100 mg at 12/30/19 0957  . donepezil  (ARICEPT) tablet 5 mg  5 mg Oral QHS Georgie Chard D, NP   5 mg at 12/29/19 2224  . furosemide (LASIX) injection 40 mg  40 mg Intravenous BID Corrin Parker, PA-C   40 mg at 12/30/19 1244  . Melatonin TABS 6 mg  6 mg Oral QHS Georgie Chard D, NP   6 mg at 12/29/19 2225  . metoprolol succinate (TOPROL-XL) 24 hr tablet 25 mg  25 mg Oral Daily Marjie Skiff E, PA-C   25 mg at 12/30/19 0957  . ondansetron (ZOFRAN) injection 4 mg  4 mg Intravenous Q6H PRN Georgie Chard D, NP   4 mg at 12/26/19 1025  . sodium chloride flush (NS) 0.9 % injection 3 mL  3 mL Intravenous Q12H Georgie Chard D, NP   3 mL at 12/26/19 2243  . sodium chloride flush (NS) 0.9 % injection 3 mL  3 mL Intravenous PRN Georgie Chard D, NP      . sodium chloride flush (NS) 0.9 % injection 3 mL  3 mL Intravenous Q12H Georgie Chard D, NP   3 mL at 12/30/19 0958  . sodium chloride flush (NS) 0.9 % injection 3 mL  3 mL Intravenous PRN Georgie Chard D, NP      . tamsulosin (FLOMAX) capsule 0.4 mg  0.4 mg Oral BID PC Filbert Schilder, NP   0.4  mg at 12/30/19 0956  . zolpidem (AMBIEN) tablet 5 mg  5 mg Oral QHS PRN Barrett, Evelene Croon, PA-C   5 mg at 12/29/19 2224     Past Medical History:  Diagnosis Date  . Anemia   . Atrial fibrillation ([place])   . BPH (benign prostatic hyperplasia)   . Bradycardia   . Cardiomyopathy   . CHF (congestive heart failure) ([place])   . Diabetes mellitus type II   . GERD (gastroesophageal reflux disease)   . History of colonoscopy   . HTN (hypertension)   . Hyperlipidemia   . ICD (implantable cardiac defibrillator) in place 10/14/2012   biventricular  . OA (osteoarthritis)   . Obesity   . Peptic ulcer     Past Surgical History:  Procedure Laterality Date  . EP IMPLANTABLE DEVICE Left   . EP IMPLANTABLE DEVICE N/A 07/27/2016   Procedure: BIV Pacemaker downgrade;  Surgeon: Evans Lance, MD;  Location: [facility];  Service: Cardiovascular;  Laterality: N/A;  .  ESOPHAGOGASTRODUODENOSCOPY  06/24/2002  . L-spine  1965  . Lumbar L4-5 & S1  02/2000  . TOTAL KNEE ARTHROPLASTY  1997   right  . TOTAL KNEE ARTHROPLASTY Left 02/15/2015   Procedure: LEFT TOTAL KNEE ARTHROPLASTY;  Surgeon: Paralee Cancel, MD;  Location: [facility];  Service: Orthopedics;  Laterality: Left;    Social History   Socioeconomic History  . Marital status: Widowed    Spouse name: Not on file  . Number of children: Not on file  . Years of education: Not on file  . Highest education level: Not on file  Occupational History  . Occupation: Retired  Tobacco Use  . Smoking status: Never Smoker  . Smokeless tobacco: Never Used  Substance and Sexual Activity  . Alcohol use: No  . Drug use: No  . Sexual activity: Not on file  Other Topics Concern  . Not on file  Social History Narrative  . Not on file   Social Determinants of Health   Financial Resource Strain:   . Difficulty of Paying Living Expenses: Not on file  Food Insecurity:   . Worried About Charity fundraiser in the Last Year: Not on file  . Ran Out of Food in the Last Year: Not on file  Transportation Needs:   . Lack of Transportation (Medical): Not on file  . Lack of Transportation (Non-Medical): Not on file  Physical Activity:   . Days of Exercise per Week: Not on file  . Minutes of Exercise per Session: Not on file  Stress:   . Feeling of Stress : Not on file  Social Connections:   . Frequency of Communication with Friends and Family: Not on file  . Frequency of Social Gatherings with Friends and Family: Not on file  . Attends Religious Services: Not on file  . Active Member of Clubs or Organizations: Not on file  . Attends Archivist Meetings: Not on file  . Marital Status: Not on file  Intimate Partner Violence:   . Fear of Current or Ex-Partner: Not on file  . Emotionally Abused: Not on file  . Physically Abused: Not on file  . Sexually Abused: Not on file    Family History  Problem  Relation Age of Onset  . Heart disease Father   . Heart disease Mother   . Diabetes Mother   . Hypertension Brother   . Hypertension Brother   . Cancer Neg Hx  ROS: no fevers or chills, productive cough, hemoptysis, dysphasia, odynophagia, melena, hematochezia, dysuria, hematuria, rash, seizure activity, orthopnea, PND, pedal edema, claudication. Remaining systems are negative.  Physical Exam: Well-developed well-nourished in no acute distress.  Skin is warm and dry.  HEENT is normal.  Neck is supple.  Chest is clear to auscultation with normal expansion.  Cardiovascular exam is regular rate and rhythm.  Abdominal exam nontender or distended. No masses palpated. Extremities show no edema. neuro grossly intact  ECG- personally reviewed  A/P  1 chronic combined systolic/diastolic congestive heart failure-patient is doing better since recent hospitalization.  Continue Lasix at present dose.  He will weigh daily and take an additional 80 mg of Lasix for weight gain of 2 to 3 pounds.  Continue fluid restriction and low-sodium diet.  2 permanent atrial fibrillation-patient's heart rate is controlled on no medications.  Continue apixaban at present dose.  3 cardiomyopathy-blood pressure was soft during recent hospitalization and therefore he is not on an ACE inhibitor or beta-blocker.  Patient also with history of orthostasis and I am hesitant to advance medical regime.  4 prior ICD-followed by electrophysiology.  5 history of hypertension-blood pressure controlled.  Olga Millers, MD

## 2019-12-30 NOTE — Progress Notes (Addendum)
Progress Note  Patient Name: [name] Date of Encounter: 12/30/2019  Primary Cardiologist: Olga Millers, MD   Subjective   No acute overnight events. Patient sitting in chair having just finished breakfast. He states he is breathing OK but he looks like he is working harder to breathe today. He was placed on 2 L of nasal cannula earlier this morning. No chest pain. No palpitations.   Inpatient Medications    Scheduled Meds: . allopurinol  100 mg Oral QPC breakfast  . apixaban  5 mg Oral BID PC  . atorvastatin  20 mg Oral QPC supper  . docusate sodium  100 mg Oral BID  . donepezil  5 mg Oral QHS  . Melatonin  6 mg Oral QHS  . metoprolol succinate  25 mg Oral Daily  . sodium chloride flush  3 mL Intravenous Q12H  . sodium chloride flush  3 mL Intravenous Q12H  . tamsulosin  0.4 mg Oral BID PC   Continuous Infusions: . sodium chloride    . sodium chloride     PRN Meds: sodium chloride, sodium chloride, acetaminophen, ondansetron (ZOFRAN) IV, sodium chloride flush, sodium chloride flush, zolpidem   Vital Signs    Vitals:   12/29/19 0913 12/29/19 1150 12/29/19 2148 12/30/19 0413  BP: 110/68 116/66 109/85 122/78  Pulse: 94 88 77 63  Resp: 18 18 20  (!) 22  Temp:  97.6 F (36.4 C) (!) 97.5 F (36.4 C) 97.6 F (36.4 C)  TempSrc:      SpO2: 98% 96% 97% 100%  Weight:    97.5 kg  Height:        Intake/Output Summary (Last 24 hours) at 12/30/2019 0745 Last data filed at 12/30/2019 0600 Gross per 24 hour  Intake 940 ml  Output 1250 ml  Net -310 ml   Last 3 Weights 12/30/2019 12/29/2019 12/28/2019  Weight (lbs) 215 lb 217 lb 11.2 oz 217 lb 12.8 oz  Weight (kg) 97.523 kg 98.748 kg 98.793 kg      Telemetry    Atrial fibrillation with ventricular pacing. Baseline rates in the 70's to 90's with brief spikes in the low 100's. - Personally Reviewed  ECG    No new ECG tracing since 12/23/2019. - Personally Reviewed  Physical Exam   GEN: No acute  distress.   Neck: Supple.  Cardiac: Irregularly irregular rhythm with normal rate. No rubs or gallops. Radial pulses 2+ and equal bilaterally. Respiratory: On 2 L of supplemental O2 via nasal cannula. Mildly tachypneic. Tight breath sounds with mild bibasilar crackles. GI: Soft, nontender, non-distended.  MS: Bilateral lower extremities wrapped. No deformity. Skin: Warm and dry. Neuro:  No focal deficits. Psych: Normal affect.  Labs    High Sensitivity Troponin:   Recent Labs  Lab 12/11/19 1338 12/11/19 1638  TROPONINIHS 41* 42*      Chemistry Recent Labs  Lab 12/23/19 2035 12/24/19 1730 12/28/19 0501 12/29/19 0516 12/30/19 0326  NA 138   < > 133* 135 129*  K 3.8   < > 4.4 4.7 4.8  CL 99   < > 95* 96* 89*  CO2 32   < > 26 27 28   GLUCOSE 158*   < > 134* 144* 148*  BUN 36*   < > 36* 41* 47*  CREATININE 1.33*   < > 1.32* 1.42* 1.60*  CALCIUM 8.8*   < > 8.7* 8.8* 8.9  PROT 5.7*  --   --   --   --  ALBUMIN 3.2*  --   --   --   --   AST 41  --   --   --   --   ALT 43  --   --   --   --   ALKPHOS 87  --   --   --   --   BILITOT 1.1  --   --   --   --   GFRNONAA 47*   < > 48* 44* 38*  GFRAA 55*   < > 55* 51* 44*  ANIONGAP 7   < > 12 12 12    < > = values in this interval not displayed.     Hematology Recent Labs  Lab 12/24/19 1730 12/25/19 0351 12/29/19 0914  WBC 8.0 7.4 6.5  RBC 4.39 4.02* 4.32  HGB 13.6 12.5* 13.5  HCT 41.5 38.0* 40.6  MCV 94.5 94.5 94.0  MCH 31.0 31.1 31.3  MCHC 32.8 32.9 33.3  RDW 14.4 14.3 14.2  PLT 135* PLATELET CLUMPS NOTED ON SMEAR, UNABLE TO ESTIMATE 105*    BNP Recent Labs  Lab 12/23/19 2035  BNP 788.7*     DDimer No results for input(s): DDIMER in the last 168 hours.   Radiology    No results found.  Cardiac Studies   Echocardiogram 12/12/2019: Impressions: 1. Left ventricular ejection fraction, by visual estimation, is 30 to 35%. The left ventricle has moderate to severely decreased function. There is no left  ventricular hypertrophy. 2. Definity contrast agent was given IV to delineate the left ventricular endocardial borders. 3. Left ventricular diastolic function could not be evaluated. 4. The left ventricle demonstrates global hypokinesis. 5. Global right ventricle has normal systolic function.The right ventricular size is normal. No increase in right ventricular wall thickness. 6. Left atrial size was severely dilated. 7. Right atrial size was severely dilated. 8. The mitral valve is normal in structure. Mild to moderate mitral valve regurgitation. No evidence of mitral stenosis. 9. The tricuspid valve is normal in structure. 10. The aortic valve is normal in structure. Aortic valve regurgitation is not visualized. Mild to moderate aortic valve sclerosis/calcification without any evidence of aortic stenosis. 11. The pulmonic valve was normal in structure. Pulmonic valve regurgitation is trivial. 12. Aortic dilatation noted. 13. There is mild dilatation of the ascending aorta measuring 42 mm. 14. Compared with the echo 02/2018, the ascending aorta has increased from 3.8 cm to 4.2 cm. 15. Moderately elevated pulmonary artery systolic pressure. 16. A pacer wire is visualized. 17. The inferior vena cava is normal in size with greater than 50% respiratory variability, suggesting right atrial pressure of 3 mmHg.  Patient Profile     [name] is a 86 y.o. male with a history of normal coronaries on cardiac catheterization in 7169, chronic systolic CHF/non-ischemic cardiomyopathy with EF of 30-35% on Echo in 2021, s/p BiV ICD placement in 10/2012 which was downgraded to BiV PPM in 07/2016, permanent atrial fibrillation on Eliquis, hypertension, hyperlipidemia, type 2 diabetes mellitus, BPH who was admitted on 12/23/2019 for acute on chronic CHF.  Assessment & Plan    Acute on Chronic Systolic CHF - BNP 678 on admission.  - Echo from 1/2/201 showed LVEF of 30-35% with global hypokinesis. -  Initially started on IV Lasix but transitioned to PO Lasix 40mg  twice daily on 1/17. PO Lasix held yesterday due to rising creatinine. Net negative 5.4 L since admission. Weight 215 lbs today, down from 225 lbs on admission. Creatinine still rising and  1.60 today. - Patient is mildly tachypneic this morning and looks like he is working harder to breathe. Mild crackles on exam. Will discuss additional Lasix with MD given rising creatinine. - Toprol XL 25mg  added on 1/18 for heart rate control.  - BP has been soft at times so will hold off on adding ACEi/ARB now. Could consider adding as outpatient if BP allows.  - Continue to monitor daily weights, strict I/O's, and renal function.  Bilateral Lower Extremity Edema with Weeping Wounds/Venous Statis - Has improved with diuresis. - No leukocytosis on lab work or signs of cellulitis. No antibiotics needed.  - Wound care consulted on 1/14 and recommendations applied. Legs currently wrapped. - Wound Care RN saw patient again yesterday and gave recommendations for discharge.   Permanent Atrial Fibrillation s/p BiV ICD - Rates better controlled today in the 70's to 90's. - Continue Toprol-XL 25mg  daily. - Continue chronic anticoagulation with Eliquis 5mg  twice daily.  Hypertension - BP currently well controlled but soft at times. - Continue Toprol-XL 25mg  daily.  Hyperlipidemia - Continue home Lipitor 20mg  daily.  Type 2 Diabetes Mellitus  - Continue SSI during admission. Can restart home medications on discharge.  Acute on CKD Stage III - Creatinine continues to rise (1.22 >> 1.32 >> 1.42 >> 1.60) even with holding Lasix yesterday.  - Will continue to monitor closely.  Mild Dementia - Continue Aricept.  For questions or updates, please contact [facility] Please consult [email or web address] for contact info under        Signed, 2/14, PA-C  12/30/2019, 7:45 AM     Patient seen and examined.  Agree with above  documentation.  Appears to have increased work of breathing this morning, though he reports dyspnea has improved.  Worsening renal function (1.4->1.6) with holding diuresis yesterday.  On exam, patient is alert and oriented, regular rate and rhythm, no murmurs, lungs with expiratory wheezing, legs wrapped, + JVD.  Telemetry personally reviewed and shows AF with rates 80s to 100s, BiV paced.  He appears volume overloaded today with worsening respiratory status, will start IV Lasix 40 mg twice daily.  , MD

## 2019-12-30 NOTE — TOC Transition Note (Signed)
Transition of Care [facility]) - CM/SW Discharge Note   Patient Details  Name: [name] MRN: [id number] Date of Birth: 03-28-1929  Transition of Care [facility]) CM/SW Contact:  Leone Haven, RN Phone Number: 12/30/2019, 10:11 AM   Clinical Narrative:    Patient is for dc today, he is set up with [facility] for [facility], HHPT, NCM notified Rebecca Eaton to let know for poss dc today.   Final next level of care: Home w Home Health Services Barriers to Discharge: No Barriers Identified   Patient Goals and CMS Choice Patient states their goals for this hospitalization and ongoing recovery are:: go home CMS Medicare.gov Compare Post Acute Care list provided to:: Patient Choice offered to / list presented to : Patient  Discharge Placement                       Discharge Plan and Services   Discharge Planning Services: CM Consult Post Acute Care Choice: Home Health          DME Arranged: (NA)         HH Arranged: RN, PT, Disease Management HH Agency: [facility]        Social Determinants of Health (SDOH) Interventions     Readmission Risk Interventions Readmission Risk Prevention Plan 12/28/2019  Transportation Screening Complete  PCP or Specialist Appt within 3-5 Days Complete  HRI or Home Care Consult Complete  Social Work Consult for Recovery Care Planning/Counseling Complete  Palliative Care Screening Not Applicable  Medication Review Oceanographer) Complete  Some recent data might be hidden

## 2019-12-31 ENCOUNTER — Inpatient Hospital Stay (HOSPITAL_COMMUNITY): Payer: Medicare HMO

## 2019-12-31 LAB — BASIC METABOLIC PANEL
Anion gap: 12 (ref 5–15)
BUN: 47 mg/dL — ABNORMAL HIGH (ref 8–23)
CO2: 29 mmol/L (ref 22–32)
Calcium: 8.8 mg/dL — ABNORMAL LOW (ref 8.9–10.3)
Chloride: 94 mmol/L — ABNORMAL LOW (ref 98–111)
Creatinine, Ser: 1.39 mg/dL — ABNORMAL HIGH (ref 0.61–1.24)
GFR calc Af Amer: 52 mL/min — ABNORMAL LOW (ref >60)
GFR calc non Af Amer: 45 mL/min — ABNORMAL LOW (ref >60)
Glucose, Bld: 138 mg/dL — ABNORMAL HIGH (ref 70–99)
Potassium: 4.8 mmol/L (ref 3.5–5.1)
Sodium: 135 mmol/L (ref 135–145)

## 2019-12-31 LAB — MAGNESIUM: Magnesium: 1.8 mg/dL (ref 1.7–2.4)

## 2019-12-31 MED ORDER — ALBUTEROL SULFATE (2.5 MG/3ML) 0.083% IN NEBU
2.5000 mg | INHALATION_SOLUTION | Freq: Once | RESPIRATORY_TRACT | Status: AC
  Administered 2019-12-31: 2.5 mg via RESPIRATORY_TRACT
  Filled 2019-12-31: qty 3

## 2019-12-31 MED ORDER — METOPROLOL SUCCINATE ER 25 MG PO TB24
37.5000 mg | ORAL_TABLET | Freq: Every day | ORAL | Status: DC
  Administered 2020-01-01 – 2020-01-06 (×6): 37.5 mg via ORAL
  Filled 2019-12-31 (×7): qty 2

## 2019-12-31 NOTE — Progress Notes (Signed)
   Received page from RN that patient complaining of worsening shortness of breath and has some wheezing. Vitals stable. Will give albuterol nebulizer and order repeat chest x-ray.  Corrin Parker, PA-C 12/31/2019 12:11 PM

## 2019-12-31 NOTE — Progress Notes (Addendum)
Progress Note  Patient Name: [facility] Date of Encounter: 12/31/2019  Primary Cardiologist: Olga Millers, MD   Subjective   No acute overnight events. Patient looks like he is breathing much better today and is back on room air. He states breathing has improved. However, he did get very winded after I asked him to sit up so I could listen to his back. No chest pain, lightheadedness, or dizziness. He does not think the wound on his leg is getting better but when I spoke with Wound Care RN a couple of days ago, she felt like it was healing well.  Inpatient Medications    Scheduled Meds: . allopurinol  100 mg Oral QPC breakfast  . apixaban  5 mg Oral BID PC  . atorvastatin  20 mg Oral QPC supper  . docusate sodium  100 mg Oral BID  . donepezil  5 mg Oral QHS  . furosemide  40 mg Intravenous BID  . Melatonin  6 mg Oral QHS  . metoprolol succinate  25 mg Oral Daily  . sodium chloride flush  3 mL Intravenous Q12H  . sodium chloride flush  3 mL Intravenous Q12H  . tamsulosin  0.4 mg Oral BID PC   Continuous Infusions: . sodium chloride    . sodium chloride     PRN Meds: sodium chloride, sodium chloride, acetaminophen, ondansetron (ZOFRAN) IV, sodium chloride flush, sodium chloride flush, zolpidem   Vital Signs    Vitals:   12/30/19 2103 12/31/19 0421 12/31/19 0430 12/31/19 0510  BP: 116/68  103/87   Pulse: (!) 52  69   Resp: 20  20   Temp: (!) 97.3 F (36.3 C)   (!) 97.3 F (36.3 C)  TempSrc: Oral   Oral  SpO2: 99%  97%   Weight:  100.4 kg    Height:        Intake/Output Summary (Last 24 hours) at 12/31/2019 2956 Last data filed at 12/31/2019 0421 Gross per 24 hour  Intake 720 ml  Output 2800 ml  Net -2080 ml   Last 3 Weights 12/31/2019 12/30/2019 12/29/2019  Weight (lbs) 221 lb 5.5 oz 215 lb 217 lb 11.2 oz  Weight (kg) 100.4 kg 97.523 kg 98.748 kg      Telemetry    Atrial fibrillation with ventricular pacing. Rates mostly in the the high 90's  to low 100's. - Personally Reviewed  ECG    No new ECG tracing since 12/23/2019. - Personally Reviewed  Physical Exam   GEN: No acute distress.   Neck: Supple.  Cardiac: Mildly tachycardic with irregularly irregular rhythm. No rubs or gallops. Radial pulses 2+ and equal bilaterally. Respiratory: Improved some from yesterday. Bibasilar crackles noted (left > right). Expiratory wheezes have improved. GI: Soft, non-tender, non-distended. Bowel sounds present. MS: Bilateral lower extremities wrapped. No deformity. Skin: Warm and dry. Neuro:  No focal deficits. Psych: Normal affect.  Labs    High Sensitivity Troponin:   Recent Labs  Lab 12/11/19 1338 12/11/19 1638  TROPONINIHS 41* 42*      Chemistry Recent Labs  Lab 12/29/19 0516 12/30/19 0326 12/31/19 0432  NA 135 129* 135  K 4.7 4.8 4.8  CL 96* 89* 94*  CO2 27 28 29   GLUCOSE 144* 148* 138*  BUN 41* 47* 47*  CREATININE 1.42* 1.60* 1.39*  CALCIUM 8.8* 8.9 8.8*  GFRNONAA 44* 38* 45*  GFRAA 51* 44* 52*  ANIONGAP 12 12 12      Hematology Recent Labs  Lab  12/24/19 1730 12/25/19 0351 12/29/19 0914  WBC 8.0 7.4 6.5  RBC 4.39 4.02* 4.32  HGB 13.6 12.5* 13.5  HCT 41.5 38.0* 40.6  MCV 94.5 94.5 94.0  MCH 31.0 31.1 31.3  MCHC 32.8 32.9 33.3  RDW 14.4 14.3 14.2  PLT 135* PLATELET CLUMPS NOTED ON SMEAR, UNABLE TO ESTIMATE 105*    BNP Recent Labs  Lab 12/30/19 0326  BNP 1,321.1*     DDimer No results for input(s): DDIMER in the last 168 hours.   Radiology    No results found.  Cardiac Studies   Echocardiogram 12/12/2019: Impressions: 1. Left ventricular ejection fraction, by visual estimation, is 30 to 35%. The left ventricle has moderate to severely decreased function. There is no left ventricular hypertrophy. 2. Definity contrast agent was given IV to delineate the left ventricular endocardial borders. 3. Left ventricular diastolic function could not be evaluated. 4. The left ventricle demonstrates  global hypokinesis. 5. Global right ventricle has normal systolic function.The right ventricular size is normal. No increase in right ventricular wall thickness. 6. Left atrial size was severely dilated. 7. Right atrial size was severely dilated. 8. The mitral valve is normal in structure. Mild to moderate mitral valve regurgitation. No evidence of mitral stenosis. 9. The tricuspid valve is normal in structure. 10. The aortic valve is normal in structure. Aortic valve regurgitation is not visualized. Mild to moderate aortic valve sclerosis/calcification without any evidence of aortic stenosis. 11. The pulmonic valve was normal in structure. Pulmonic valve regurgitation is trivial. 12. Aortic dilatation noted. 13. There is mild dilatation of the ascending aorta measuring 42 mm. 14. Compared with the echo 02/2018, the ascending aorta has increased from 3.8 cm to 4.2 cm. 15. Moderately elevated pulmonary artery systolic pressure. 16. A pacer wire is visualized. 17. The inferior vena cava is normal in size with greater than 50% respiratory variability, suggesting right atrial pressure of 3 mmHg.  Patient Profile     Mr. Wingert is a 86 y.o. male with a history of normal coronaries on cardiac catheterization in 6045, chronic systolic CHF/non-ischemic cardiomyopathy with EF of 30-35% on Echo in 2021, s/p BiV ICD placement in 10/2012 which was downgraded to BiV PPM in 07/2016, permanent atrial fibrillation on Eliquis, hypertension, hyperlipidemia, type 2 diabetes mellitus, BPH who was admitted on 12/23/2019 for acute on chronic CHF.  Assessment & Plan    Acute on Chronic Systolic CHF - BNP 409 on admission.  - Echo from 1/2/201 showed LVEF of 30-35% with global hypokinesis. - Transitioned to PO Lasix on 1/17 however this was held on 1/19 due to rising creatinine. IV Lasix restarted yesterday due to worsening respiratory status. Documented 2.8 L of urinary output over the last 24 hours and net  negative 7.5 L since admission. Weight 221 lb today (don't think this is accurate), up from 215 lbs yesterday. Renal function improving with diuresis. - Breathing improved today and back on room air. However, patient does get noticeably short of breath with minimal activity such as asking him to sit up in bed. - Continue IV Lasix 40mg  twice daily. - Toprol XL 25mg  added on 1/18 for heart rate control. Will increase to 37.5mg  daily for additional rate control. - BP has been soft at times so will hold off on adding ACEi/ARB now. Could consider adding as outpatient if BP allows.  - Continue to monitor daily weights, strict I/O's, and renal function.  Bilateral Lower Extremity Edema with Weeping Wounds/Venous Statis - Has improved with diuresis. -  No leukocytosis on lab work or signs of cellulitis. No antibiotics needed.  - Wound care consulted on 1/14 and recommendations applied. Legs currently wrapped. - Wound Care RN saw patient again on 1/19 and gave recommendations for discharge.   Permanent Atrial Fibrillation s/p BiV ICD - Rates mostly in the high 90's to low 100's. - Potassium 4.8 today.  - Will recheck Magnesium given Lasix was restarted. - Will increase Toprol-XL to 37.5mg  daily. - Continue chronic anticoagulation with Eliquis 5mg  twice daily.  Hypertension - BP currently well. - Continue Toprol-XL as above.  Hyperlipidemia - Continue home Lipitor 20mg  daily.  Type 2 Diabetes Mellitus  - Continue SSI during admission. Can restart home medications on discharge.  Acute on CKD Stage III - Creatinine improved from 1.60 yesterday to 1.39 today after restarting Lasix. - Continue daily BMET.  Mild Dementia - Continue Aricept.  For questions or updates, please contact [facility] Please consult [email or web address] for contact info under        Signed, , PA-C  12/31/2019, 7:22 AM    Patient seen and examined.  Agree with above documentation.  Diuresed with  IV Lasix 40 mg twice daily yesterday, net -2 L (net -7.5 L on admission) with improvement in creatinine from 1.6 to 1.4 today. On exam, patient is alert and oriented, irregular rate and tachycardic, no murmurs, lungs diminished at bases but wheezing has resolved, legs wrapped, + JVD.  Will continue IV diuresis.  Increase toprol XL for better rate control.  Corrin Parker, MD

## 2020-01-01 LAB — CBC
HCT: 39.4 % (ref 39.0–52.0)
Hemoglobin: 13.1 g/dL (ref 13.0–17.0)
MCH: 31 pg (ref 26.0–34.0)
MCHC: 33.2 g/dL (ref 30.0–36.0)
MCV: 93.4 fL (ref 80.0–100.0)
Platelets: 102 10*3/uL — ABNORMAL LOW (ref 150–400)
RBC: 4.22 MIL/uL (ref 4.22–5.81)
RDW: 14.3 % (ref 11.5–15.5)
WBC: 6.7 10*3/uL (ref 4.0–10.5)
nRBC: 0 % (ref 0.0–0.2)

## 2020-01-01 LAB — BASIC METABOLIC PANEL
Anion gap: 9 (ref 5–15)
BUN: 40 mg/dL — ABNORMAL HIGH (ref 8–23)
CO2: 32 mmol/L (ref 22–32)
Calcium: 8.5 mg/dL — ABNORMAL LOW (ref 8.9–10.3)
Chloride: 91 mmol/L — ABNORMAL LOW (ref 98–111)
Creatinine, Ser: 1.18 mg/dL (ref 0.61–1.24)
GFR calc Af Amer: >60 mL/min (ref >60)
GFR calc non Af Amer: 55 mL/min — ABNORMAL LOW (ref >60)
Glucose, Bld: 141 mg/dL — ABNORMAL HIGH (ref 70–99)
Potassium: 4.2 mmol/L (ref 3.5–5.1)
Sodium: 132 mmol/L — ABNORMAL LOW (ref 135–145)

## 2020-01-01 LAB — MAGNESIUM: Magnesium: 1.7 mg/dL (ref 1.7–2.4)

## 2020-01-01 MED ORDER — MAGNESIUM OXIDE 400 (241.3 MG) MG PO TABS
400.0000 mg | ORAL_TABLET | Freq: Every day | ORAL | Status: DC
  Administered 2020-01-01 – 2020-01-17 (×17): 400 mg via ORAL
  Filled 2020-01-01 (×17): qty 1

## 2020-01-01 NOTE — Progress Notes (Signed)
PT Cancellation Note  Patient Details Name: [name] MRN: [id number] DOB: Apr 17, 1929   Cancelled Treatment:    Reason Eval/Treat Not Completed: Patient declined, no reason specified Pt declined participating in PT at this due to just starting lunch. PT will continue to follow acutely.    Erline Levine, PTA Acute Rehabilitation Services Pager: [phone] Office: [phone]   01/01/2020, 12:08 PM

## 2020-01-01 NOTE — Progress Notes (Signed)
Physical Therapy Treatment Patient Details Name: Jaquari Reckner MRN: [id number] DOB: [birth year] Today's Date: 01/01/2020    History of Present Illness Patient is a 86 y/o male who presents with Acute on chronic systolic CHF/nonischemic cardiomyopathy. PMh includes systolic dysfunction CHF, nonischemic cardiomyopathy, pacemaker placement, HTN, DM, HLD, frequent PVCs.    PT Comments    Patient seen for mobility progression. Current plan remains appropriate.     Follow Up Recommendations  Home health PT;Supervision for mobility/OOB     Equipment Recommendations  None recommended by PT    Recommendations for Other Services OT consult     Precautions / Restrictions Precautions Precautions: Fall Precaution Comments: watch HR Restrictions Weight Bearing Restrictions: No    Mobility  Bed Mobility Overal bed mobility: Modified Independent Bed Mobility: Supine to Sit;Sit to Supine           General bed mobility comments: increased time/effort; use of rail  Transfers Overall transfer level: Needs assistance Equipment used: Straight cane Transfers: Sit to/from Stand Sit to Stand: Min guard            Ambulation/Gait Ambulation/Gait assistance: Min guard Gait Distance (Feet): 100 Feet Assistive device: Straight cane Gait Pattern/deviations: Decreased stride length;Trunk flexed;Wide base of support;Step-through pattern Gait velocity: decreased   General Gait Details: min guard for safety; no LOB    Stairs             Wheelchair Mobility    Modified Rankin (Stroke Patients Only)       Balance Overall balance assessment: Needs assistance Sitting-balance support: Feet supported;No upper extremity supported Sitting balance-Leahy Scale: Fair     Standing balance support: During functional activity Standing balance-Leahy Scale: Poor                              Cognition Arousal/Alertness: Awake/alert Behavior During Therapy:  WFL for tasks assessed/performed Overall Cognitive Status: Within Functional Limits for tasks assessed                                        Exercises      General Comments General comments (skin integrity, edema, etc.): SpO2 >95% on RA but 1L O2 via [place]or donned end of session for pt comfort; pt reports the O2 seems to help his anxiety attacks      Pertinent Vitals/Pain Pain Assessment: No/denies pain    Home Living                      Prior Function            PT Goals (current goals can now be found in the care plan section) Progress towards PT goals: Progressing toward goals    Frequency    Min 3X/week      PT Plan Current plan remains appropriate    Co-evaluation              AM-PAC PT "6 Clicks" Mobility   Outcome Measure  Help needed turning from your back to your side while in a flat bed without using bedrails?: A Little Help needed moving from lying on your back to sitting on the side of a flat bed without using bedrails?: A Little Help needed moving to and from a bed to a chair (including a wheelchair)?: A Little Help needed standing up from a chair  using your arms (e.g., wheelchair or bedside chair)?: A Little Help needed to walk in hospital room?: A Little Help needed climbing 3-5 steps with a railing? : A Little 6 Click Score: 18    End of Session Equipment Utilized During Treatment: Gait belt Activity Tolerance: Patient tolerated treatment well Patient left: with call bell/phone within reach;in bed;with bed alarm set Nurse Communication: Mobility status PT Visit Diagnosis: Unsteadiness on feet (R26.81);Difficulty in walking, not elsewhere classified (R26.2)     Time: 7672-0947 PT Time Calculation (min) (ACUTE ONLY): 26 min  Charges:  $Gait Training: 23-37 mins                     Earney Navy, PTA Acute Rehabilitation Services Pager: [phone] Office: [phone]     Darliss Cheney 01/01/2020,  4:51 PM

## 2020-01-01 NOTE — TOC Transition Note (Signed)
Transition of Care [facility]) - CM/SW Discharge Note   Patient Details  Name: [name] MRN: [id number] Date of Birth: 1929/10/04  Transition of Care [facility]) CM/SW Contact:  Leone Haven, RN Phone Number: 01/01/2020, 2:44 PM   Clinical Narrative:    Patient for dc tomorrow, he is active with [facility], NCM notified Tiffany with [facility] of this information.  Soc will begin 24 to 48 hrs post dc.    Final next level of care: Home w Home Health Services Barriers to Discharge: Continued Medical Work up   Patient Goals and CMS Choice Patient states their goals for this hospitalization and ongoing recovery are:: go home CMS Medicare.gov Compare Post Acute Care list provided to:: Patient Choice offered to / list presented to : Patient  Discharge Placement                       Discharge Plan and Services   Discharge Planning Services: CM Consult Post Acute Care Choice: Home Health          DME Arranged: (NA)         HH Arranged: RN, PT, Disease Management HH Agency: [facility] Date HH Agency Contacted: 01/01/20 Time HH Agency Contacted: 1443 Representative spoke with at [facility] Agency: Tiffany  Social Determinants of Health (SDOH) Interventions     Readmission Risk Interventions Readmission Risk Prevention Plan 12/28/2019  Transportation Screening Complete  PCP or Specialist Appt within 3-5 Days Complete  HRI or Home Care Consult Complete  Social Work Consult for Recovery Care Planning/Counseling Complete  Palliative Care Screening Not Applicable  Medication Review [employer]) Complete  Some recent data might be hidden

## 2020-01-01 NOTE — Progress Notes (Addendum)
Progress Note  [facility]: [name] Date of Encounter: 01/01/2020  Primary Cardiologist: Kirk Ruths, MD   Subjective   Denies any CP, breathing improving. Leg edema also improving.   Inpatient Medications    Scheduled Meds: . allopurinol  100 mg Oral QPC breakfast  . apixaban  5 mg Oral BID PC  . atorvastatin  20 mg Oral QPC supper  . docusate sodium  100 mg Oral BID  . donepezil  5 mg Oral QHS  . furosemide  40 mg Intravenous BID  . Melatonin  6 mg Oral QHS  . metoprolol succinate  37.5 mg Oral Daily  . sodium chloride flush  3 mL Intravenous Q12H  . sodium chloride flush  3 mL Intravenous Q12H  . tamsulosin  0.4 mg Oral BID PC   Continuous Infusions: . sodium chloride    . sodium chloride     PRN Meds: sodium chloride, sodium chloride, acetaminophen, ondansetron (ZOFRAN) IV, sodium chloride flush, sodium chloride flush, zolpidem   Vital Signs    Vitals:   01/01/20 0304 01/01/20 0306 01/01/20 0912 01/01/20 0922  BP: 105/62  114/79 100/73  Pulse: (!) 57  (!) 53 (!) 56  Resp:      Temp:      TempSrc:      SpO2:      Weight:  103.1 kg    Height:        Intake/Output Summary (Last 24 hours) at 01/01/2020 0936 Last data filed at 01/01/2020 6712 Gross per 24 hour  Intake 1040 ml  Output 2025 ml  Net -985 ml   Last 3 Weights 01/01/2020 12/31/2019 12/30/2019  Weight (lbs) 227 lb 4.7 oz 221 lb 5.5 oz 215 lb  Weight (kg) 103.1 kg 100.4 kg 97.523 kg      Telemetry    Atrial fibrillation and paced rhythm - Personally Reviewed  ECG    ventricularly paced rhythm - Personally Reviewed  Physical Exam   GEN: No acute distress.   Neck: No JVD Cardiac: irregularly irregular, no murmurs, rubs, or gallops.  Respiratory: Clear to auscultation bilaterally. GI: Soft, nontender, non-distended  MS: No edema; No deformity. Neuro:  Nonfocal  Psych: Normal affect   Labs    High Sensitivity Troponin:   Recent Labs  Lab 12/11/19 1338  12/11/19 1638  TROPONINIHS 41* 42*      Chemistry Recent Labs  Lab 12/30/19 0326 12/31/19 0432 01/01/20 0536  NA 129* 135 132*  K 4.8 4.8 4.2  CL 89* 94* 91*  CO2 28 29 32  GLUCOSE 148* 138* 141*  BUN 47* 47* 40*  CREATININE 1.60* 1.39* 1.18  CALCIUM 8.9 8.8* 8.5*  GFRNONAA 38* 45* 55*  GFRAA 44* 52* >60  ANIONGAP 12 12 9      Hematology Recent Labs  Lab 12/29/19 0914 01/01/20 0536  WBC 6.5 6.7  RBC 4.32 4.22  HGB 13.5 13.1  HCT 40.6 39.4  MCV 94.0 93.4  MCH 31.3 31.0  MCHC 33.3 33.2  RDW 14.2 14.3  PLT 105* 102*    BNP Recent Labs  Lab 12/30/19 0326  BNP 1,321.1*     DDimer No results for input(s): DDIMER in the last 168 hours.   Radiology    DG CHEST PORT 1 VIEW  Result Date: 12/31/2019 CLINICAL DATA:  Shortness of breath. EXAM: PORTABLE CHEST 1 VIEW COMPARISON:  12/23/2019 FINDINGS: 1324 hours. Low lung volumes. The cardio pericardial silhouette is enlarged. There is pulmonary vascular congestion without overt pulmonary  edema. Bibasilar collapse/consolidation with probable small bilateral pleural effusions. Left permanent pacemaker again noted. Bones are diffusely demineralized. IMPRESSION: 1. Vascular congestion without overt airspace edema. 2. Bibasilar collapse/consolidation, slightly progressed in the interval. 3. Small bilateral pleural effusions. Electronically Signed   By: Kennith Center M.D.   On: 12/31/2019 13:46    Cardiac Studies   Echo 12/12/2019 IMPRESSIONS  1. Left ventricular ejection fraction, by visual estimation, is 30 to 35%. The left ventricle has moderate to severely decreased function. There is no left ventricular hypertrophy.  2. Definity contrast agent was given IV to delineate the left ventricular endocardial borders.  3. Left ventricular diastolic function could not be evaluated.  4. The left ventricle demonstrates global hypokinesis.  5. Global right ventricle has normal systolic function.The right ventricular size is normal.  No increase in right ventricular wall thickness.  6. Left atrial size was severely dilated.  7. Right atrial size was severely dilated.  8. The mitral valve is normal in structure. Mild to moderate mitral valve regurgitation. No evidence of mitral stenosis.  9. The tricuspid valve is normal in structure. 10. The aortic valve is normal in structure. Aortic valve regurgitation is not visualized. Mild to moderate aortic valve sclerosis/calcification without any evidence of aortic stenosis. 11. The pulmonic valve was normal in structure. Pulmonic valve regurgitation is trivial. 12. Aortic dilatation noted. 13. There is mild dilatation of the ascending aorta measuring 42 mm. 14. Compared with the echo 02/2018, the ascending aorta has increased from 3.8 cm to 4.2 cm. 15. Moderately elevated pulmonary artery systolic pressure. 16. A pacer wire is visualized. 17. The inferior vena cava is normal in size with greater than 50% respiratory variability, suggesting right atrial pressure of 3 mmHg.   Patient Profile     86 y.o. male with PMH of normal coronaries on cath 2006, NICM with baseline EF 30-35% on echo 2021, s/p BIV ICD placement 10/2012 downgraded to BiV PPM 07/2016, permanent atrial fibrillation on eliquis, HTN, HLD, DM II and BPH who admitted for acute on chronic CHF.  Assessment & Plan    1. Acute on chronic systolic heart failure  - Echo 12/12/2019 showed EF 30-35% with global hypokinesis  - I/O -8.5L. LE edema has significantly improved. CXR obtained on 12/31/2019 showed vascular congestion, small bilateral pleural effusion  - Physical exam consistent with bibasilar pleural effusion. Leg edema has largely resolved. Will continue IV diuresis for another day while trending renal function, may be able to transition to PO lasix tomorrow   2. Bilateral LE edema: resolved. Wrapped in elastic dressing  3. Permanent atrial fibrillation: on eliquis. Rate controlled on metoprolol succinate 37.5 mg  daily.   4. NICM s/p BiV PPM: EF 30-35%.   5. HTN: BP stable on current therapy  6. HLD: on lipitor  7. DM II  8. Acute on chronic renal insufficiency: improving  9. Mitral regurgitation: mild to moderate based on recent echo   For questions or updates, please contact [facility] Please consult [email or web address] for contact info under      Signed, Azalee Course, PA  01/01/2020, 9:36 AM    Patient seen and examined.  Agree with above documentation.  On exam, patient is alert and oriented, irregular rhythm, no murmurs, lungs with mild bibasilar crackles, no LE edema or JVD.  Telemetry personally reviewed and shows atrial fibrillation with rates 80s to 100s, BiV paced.  Appears more euvolemic today and renal function continues to improve with diuresis.  Net negative 8.5L  on admission.  Would continue IV diuresis for today, may be able to transition to PO tomorrow.  Little Ishikawa, MD

## 2020-01-01 NOTE — Plan of Care (Signed)
  Problem: Nutrition: Goal: Adequate nutrition will be maintained Outcome: Completed/Met   Problem: Elimination: Goal: Will not experience complications related to bowel motility Outcome: Completed/Met Goal: Will not experience complications related to urinary retention Outcome: Completed/Met   Problem: Pain Managment: Goal: General experience of comfort will improve Outcome: Completed/Met   

## 2020-01-01 NOTE — Progress Notes (Signed)
   Vital Signs MEWS/VS Documentation      01/01/2020 4132 01/01/2020 0922 01/01/2020 1032 01/01/2020 1215   MEWS Score:  0  1  0  2   MEWS Score Color:  Green  Green  Green  Yellow   Resp:  --  --  19  20   Pulse:  --  (!) 56  98  (!) 45   BP:  --  100/73  106/78  (!) 88/70   O2 Device:  --  --  Nasal Cannula  Nasal Cannula   O2 Flow Rate (L/min):  --  --  1 L/min  --   Level of Consciousness:  Alert  --  --  --     Metoprolol given at 1030.  Pt asymptomatic. Pt is currently in bed in 1 L [place].  PA page to notify of vitals.       Electa Sniff 01/01/2020,12:53 PM

## 2020-01-02 DIAGNOSIS — E785 Hyperlipidemia, unspecified: Secondary | ICD-10-CM

## 2020-01-02 LAB — BASIC METABOLIC PANEL
Anion gap: 10 (ref 5–15)
BUN: 44 mg/dL — ABNORMAL HIGH (ref 8–23)
CO2: 30 mmol/L (ref 22–32)
Calcium: 8.9 mg/dL (ref 8.9–10.3)
Chloride: 91 mmol/L — ABNORMAL LOW (ref 98–111)
Creatinine, Ser: 1.25 mg/dL — ABNORMAL HIGH (ref 0.61–1.24)
GFR calc Af Amer: 59 mL/min — ABNORMAL LOW (ref >60)
GFR calc non Af Amer: 51 mL/min — ABNORMAL LOW (ref >60)
Glucose, Bld: 138 mg/dL — ABNORMAL HIGH (ref 70–99)
Potassium: 4.3 mmol/L (ref 3.5–5.1)
Sodium: 131 mmol/L — ABNORMAL LOW (ref 135–145)

## 2020-01-02 MED ORDER — FUROSEMIDE 80 MG PO TABS
80.0000 mg | ORAL_TABLET | Freq: Every day | ORAL | Status: DC
  Administered 2020-01-02: 10:00:00 80 mg via ORAL
  Filled 2020-01-02: qty 1

## 2020-01-02 MED ORDER — IPRATROPIUM-ALBUTEROL 0.5-2.5 (3) MG/3ML IN SOLN: 3.0000 mL | RESPIRATORY_TRACT | Status: DC | PRN

## 2020-01-02 NOTE — Plan of Care (Signed)
°  Problem: Education: °Goal: Ability to demonstrate management of disease process will improve °Outcome: Progressing °Goal: Ability to verbalize understanding of medication therapies will improve °Outcome: Progressing °Goal: Individualized Educational Video(s) °Outcome: Progressing °  °

## 2020-01-02 NOTE — Progress Notes (Signed)
Progress Note  Patient Name: [facility] Date of Encounter: 01/02/2020  Primary Cardiologist: Kirk Ruths, MD   Subjective   Wants to go "back to [facility]. Where I was for the last week and a half.  I don't know why they are keeping me here."  Denies chest pain.  Breathing is better but not at baseline.   Inpatient Medications    Scheduled Meds: . allopurinol  100 mg Oral QPC breakfast  . apixaban  5 mg Oral BID PC  . atorvastatin  20 mg Oral QPC supper  . docusate sodium  100 mg Oral BID  . donepezil  5 mg Oral QHS  . furosemide  40 mg Intravenous BID  . magnesium oxide  400 mg Oral Daily  . Melatonin  6 mg Oral QHS  . metoprolol succinate  37.5 mg Oral Daily  . sodium chloride flush  3 mL Intravenous Q12H  . sodium chloride flush  3 mL Intravenous Q12H  . tamsulosin  0.4 mg Oral BID PC   Continuous Infusions: . sodium chloride    . sodium chloride     PRN Meds: sodium chloride, sodium chloride, acetaminophen, ondansetron (ZOFRAN) IV, sodium chloride flush, sodium chloride flush, zolpidem   Vital Signs    Vitals:   01/01/20 1512 01/01/20 1951 01/02/20 0401 01/02/20 0700  BP: 98/76 95/75 97/78    Pulse: 73 100 75   Resp: 18 17 18    Temp:  97.7 F (36.5 C) (!) 97.5 F (36.4 C)   TempSrc:      SpO2: 93% 100% 94%   Weight:    102.2 kg  Height:        Intake/Output Summary (Last 24 hours) at 01/02/2020 0834 Last data filed at 01/01/2020 1749 Gross per 24 hour  Intake 960 ml  Output 700 ml  Net 260 ml   Last 3 Weights 01/02/2020 01/01/2020 12/31/2019  Weight (lbs) 225 lb 5 oz 227 lb 4.7 oz 221 lb 5.5 oz  Weight (kg) 102.2 kg 103.1 kg 100.4 kg      Telemetry    Atrial fibrillation.  VP. - Personally Reviewed  ECG    n/a - Personally Reviewed  Physical Exam   VS:  BP 97/78 (BP Location: Right Arm)   Pulse 75   Temp (!) 97.5 F (36.4 C)   Resp 18   Ht 5\' 8"  (1.727 m)   Wt 102.2 kg   SpO2 94%   BMI 34.26 kg/m  , BMI Body mass  index is 34.26 kg/m. GENERAL:   No acute distress.   HEENT: Pupils equal round and reactive, fundi not visualized, oral mucosa unremarkable NECK:  No jugular venous distention, waveform within normal limits, carotid upstroke brisk and symmetric, no bruits LUNGS:  Diffuse expiratory wheeze HEART: Irregularly irregular.  PMI not displaced or sustained,S1 and S2 within normal limits, no S3, no S4, no clicks, no rubs, no murmurs ABD:  Flat, positive bowel sounds normal in frequency in pitch, no bruits, no rebound, no guarding, no midline pulsatile mass, no hepatomegaly, no splenomegaly EXT: Bilateral lower extremities are wrapped.   SKIN:  Chronic venous stasis dermatitis above the leg wraps. NEURO:  Cranial nerves II through XII grossly intact, motor grossly intact throughout PSYCH:  Confused.   Labs    High Sensitivity Troponin:   Recent Labs  Lab 12/11/19 1338 12/11/19 1638  TROPONINIHS 41* 42*      Chemistry Recent Labs  Lab 12/31/19 [phone] 01/01/20 0536 01/02/20 0549  NA 135 132* 131*  K 4.8 4.2 4.3  CL 94* 91* 91*  CO2 29 32 30  GLUCOSE 138* 141* 138*  BUN 47* 40* 44*  CREATININE 1.39* 1.18 1.25*  CALCIUM 8.8* 8.5* 8.9  GFRNONAA 45* 55* 51*  GFRAA 52* >60 59*  ANIONGAP 12 9 10      Hematology Recent Labs  Lab 12/29/19 0914 01/01/20 0536  WBC 6.5 6.7  RBC 4.32 4.22  HGB 13.5 13.1  HCT 40.6 39.4  MCV 94.0 93.4  MCH 31.3 31.0  MCHC 33.3 33.2  RDW 14.2 14.3  PLT 105* 102*    BNP Recent Labs  Lab 12/30/19 0326  BNP 1,321.1*     DDimer No results for input(s): DDIMER in the last 168 hours.   Radiology    DG CHEST PORT 1 VIEW  Result Date: 12/31/2019 CLINICAL DATA:  Shortness of breath. EXAM: PORTABLE CHEST 1 VIEW COMPARISON:  12/23/2019 FINDINGS: 1324 hours. Low lung volumes. The cardio pericardial silhouette is enlarged. There is pulmonary vascular congestion without overt pulmonary edema. Bibasilar collapse/consolidation with probable small bilateral  pleural effusions. Left permanent pacemaker again noted. Bones are diffusely demineralized. IMPRESSION: 1. Vascular congestion without overt airspace edema. 2. Bibasilar collapse/consolidation, slightly progressed in the interval. 3. Small bilateral pleural effusions. Electronically Signed   By: 12/25/2019 M.D.   On: 12/31/2019 13:46    Cardiac Studies   Echo 12/12/2019  1. Left ventricular ejection fraction, by visual estimation, is 30 to 35%. The left ventricle has moderate to severely decreased function. There is no left ventricular hypertrophy. 2. Definity contrast agent was given IV to delineate the left ventricular endocardial borders. 3. Left ventricular diastolic function could not be evaluated. 4. The left ventricle demonstrates global hypokinesis. 5. Global right ventricle has normal systolic function.The right ventricular size is normal. No increase in right ventricular wall thickness. 6. Left atrial size was severely dilated. 7. Right atrial size was severely dilated. 8. The mitral valve is normal in structure. Mild to moderate mitral valve regurgitation. No evidence of mitral stenosis. 9. The tricuspid valve is normal in structure. 10. The aortic valve is normal in structure. Aortic valve regurgitation is not visualized. Mild to moderate aortic valve sclerosis/calcification without any evidence of aortic stenosis. 11. The pulmonic valve was normal in structure. Pulmonic valve regurgitation is trivial. 12. Aortic dilatation noted. 13. There is mild dilatation of the ascending aorta measuring 42 mm. 14. Compared with the echo 02/2018, the ascending aorta has increased from 3.8 cm to 4.2 cm. 15. Moderately elevated pulmonary artery systolic pressure. 16. A pacer wire is visualized. 17. The inferior vena cava is normal in size with greater than 50% respiratory variability, suggesting right atrial pressure of 3 mmHg  Patient Profile     86 y.o. male with chronic systolic  and diastolic heart failure (LVEF 30 to 35%), status post BiV pacemaker, permanent atrial fibrillation on Eliquis, hypertension, hyperlipidemia, and diabetes admitted with acute on chronic systolic and diastolic heart failure.  Assessment & Plan    # Acute on chronic systolic and diastolic heart failure: NICM. S/p biV PPM.  He was net +260 yesterday.  -8.6L this admission.  Renal function is worsening.  Stop IV lasix.  Resume home lasix 80mg  po daily.  Continue Unna boots.  He is very wheezy today and still requiring supplemental oxygen.  We will try duo nebs.  # Permanent atrial fibrillation:  Continue Eliquis and metoprolol.  # Hypertension: BP running low.  Stop IV  diuretic as above.  Continue metoprolol.  # Hyperlipidmia:  Continue atorvastatin.  # Acute on chronic renal failure: Creatinine improved to 1.18 yesterday up from a peak of 1.6.  Today it is up to 1.25.  Stop IV Lasix and resume his home dose of 80 mg p.o. daily.  # Hyperlipidemia: Continue atorvastatin.  # Dementia: Continue donepezil.        For questions or updates, please contact [facility] Please consult [email or web address] for contact info under        Signed, Chilton Si, MD  01/02/2020, 8:34 AM

## 2020-01-03 ENCOUNTER — Inpatient Hospital Stay (HOSPITAL_COMMUNITY): Payer: Medicare HMO

## 2020-01-03 DIAGNOSIS — I5043 Acute on chronic combined systolic (congestive) and diastolic (congestive) heart failure: Secondary | ICD-10-CM

## 2020-01-03 DIAGNOSIS — E1121 Type 2 diabetes mellitus with diabetic nephropathy: Secondary | ICD-10-CM

## 2020-01-03 DIAGNOSIS — Z95810 Presence of automatic (implantable) cardiac defibrillator: Secondary | ICD-10-CM

## 2020-01-03 LAB — BASIC METABOLIC PANEL
Anion gap: 13 (ref 5–15)
BUN: 51 mg/dL — ABNORMAL HIGH (ref 8–23)
CO2: 24 mmol/L (ref 22–32)
Calcium: 8.9 mg/dL (ref 8.9–10.3)
Chloride: 93 mmol/L — ABNORMAL LOW (ref 98–111)
Creatinine, Ser: 1.42 mg/dL — ABNORMAL HIGH (ref 0.61–1.24)
GFR calc Af Amer: 51 mL/min — ABNORMAL LOW (ref >60)
GFR calc non Af Amer: 44 mL/min — ABNORMAL LOW (ref >60)
Glucose, Bld: 171 mg/dL — ABNORMAL HIGH (ref 70–99)
Potassium: 4.6 mmol/L (ref 3.5–5.1)
Sodium: 130 mmol/L — ABNORMAL LOW (ref 135–145)

## 2020-01-03 LAB — BRAIN NATRIURETIC PEPTIDE: B Natriuretic Peptide: 1494.6 pg/mL — ABNORMAL HIGH (ref 0.0–100.0)

## 2020-01-03 MED ORDER — FUROSEMIDE 10 MG/ML IJ SOLN
80.0000 mg | Freq: Two times a day (BID) | INTRAMUSCULAR | Status: AC
  Administered 2020-01-03 (×2): 80 mg via INTRAVENOUS
  Filled 2020-01-03 (×2): qty 8

## 2020-01-03 NOTE — Progress Notes (Signed)
Progress Note  Patient Name: [name] Date of Encounter: 01/03/2020  Primary Cardiologist: Olga Millers, MD   Subjective   Feeling well.  Breathing improving but still short of breath.  SOB with ambulation yesterday.  Inpatient Medications    Scheduled Meds: . allopurinol  100 mg Oral QPC breakfast  . apixaban  5 mg Oral BID PC  . atorvastatin  20 mg Oral QPC supper  . docusate sodium  100 mg Oral BID  . donepezil  5 mg Oral QHS  . furosemide  80 mg Oral Daily  . magnesium oxide  400 mg Oral Daily  . Melatonin  6 mg Oral QHS  . metoprolol succinate  37.5 mg Oral Daily  . sodium chloride flush  3 mL Intravenous Q12H  . sodium chloride flush  3 mL Intravenous Q12H  . tamsulosin  0.4 mg Oral BID PC   Continuous Infusions: . sodium chloride    . sodium chloride     PRN Meds: sodium chloride, sodium chloride, acetaminophen, ipratropium-albuterol, ondansetron (ZOFRAN) IV, sodium chloride flush, sodium chloride flush, zolpidem   Vital Signs    Vitals:   01/02/20 0700 01/02/20 1231 01/02/20 2047 01/03/20 0626  BP:  (!) 108/91 108/67 109/75  Pulse:  (!) 50 (!) 47 60  Resp:  18 20 20   Temp:  97.8 F (36.6 C) (!) 97.4 F (36.3 C) (!) 97.5 F (36.4 C)  TempSrc:   Oral Oral  SpO2:  99% 100% 99%  Weight: 102.2 kg   101.2 kg  Height:        Intake/Output Summary (Last 24 hours) at 01/03/2020 0820 Last data filed at 01/03/2020 0631 Gross per 24 hour  Intake 320 ml  Output 1250 ml  Net -930 ml   Last 3 Weights 01/03/2020 01/02/2020 01/01/2020  Weight (lbs) 223 lb 1.7 oz 225 lb 5 oz 227 lb 4.7 oz  Weight (kg) 101.2 kg 102.2 kg 103.1 kg      Telemetry    Atrial fibrillation.  VP. Rate <100 bpm- Personally Reviewed  ECG    n/a - Personally Reviewed  Physical Exam   VS:  BP 109/75 (BP Location: Right Arm)   Pulse 60   Temp (!) 97.5 F (36.4 C) (Oral)   Resp 20   Ht 5\' 8"  (1.727 m)   Wt 101.2 kg   SpO2 99%   BMI 33.92 kg/m  , BMI Body mass  index is 33.92 kg/m. GENERAL:   No acute distress.   HEENT: Pupils equal round and reactive, fundi not visualized, oral mucosa unremarkable NECK:  + jugular venous distention, waveform within normal limits, carotid upstroke brisk and symmetric, no bruits LUNGS: Diminished at bases.  Mild crackles. No wheezes HEART: Irregularly irregular.  PMI not displaced or sustained,S1 and S2 within normal limits, no S3, no S4, no clicks, no rubs, no murmurs ABD:  Flat, positive bowel sounds normal in frequency in pitch, no bruits, no rebound, no guarding, no midline pulsatile mass, no hepatomegaly, no splenomegaly EXT: Bilateral lower extremities are wrapped.  Edema improving SKIN:  Chronic venous stasis dermatitis above the leg wraps. NEURO:  Cranial nerves II through XII grossly intact, motor grossly intact throughout PSYCH:  Confused.   Labs    High Sensitivity Troponin:   Recent Labs  Lab 12/11/19 1338 12/11/19 1638  TROPONINIHS 41* 42*      Chemistry Recent Labs  Lab 01/01/20 0536 01/02/20 0549 01/03/20 0523  NA 132* 131* 130*  K 4.2  4.3 4.6  CL 91* 91* 93*  CO2 32 30 24  GLUCOSE 141* 138* 171*  BUN 40* 44* 51*  CREATININE 1.18 1.25* 1.42*  CALCIUM 8.5* 8.9 8.9  GFRNONAA 55* 51* 44*  GFRAA >60 59* 51*  ANIONGAP 9 10 13      Hematology Recent Labs  Lab 12/29/19 0914 01/01/20 0536  WBC 6.5 6.7  RBC 4.32 4.22  HGB 13.5 13.1  HCT 40.6 39.4  MCV 94.0 93.4  MCH 31.3 31.0  MCHC 33.3 33.2  RDW 14.2 14.3  PLT 105* 102*    BNP Recent Labs  Lab 12/30/19 0326  BNP 1,321.1*     DDimer No results for input(s): DDIMER in the last 168 hours.   Radiology    No results found.  Cardiac Studies   Echo 12/12/2019  1. Left ventricular ejection fraction, by visual estimation, is 30 to 35%. The left ventricle has moderate to severely decreased function. There is no left ventricular hypertrophy. 2. Definity contrast agent was given IV to delineate the left ventricular  endocardial borders. 3. Left ventricular diastolic function could not be evaluated. 4. The left ventricle demonstrates global hypokinesis. 5. Global right ventricle has normal systolic function.The right ventricular size is normal. No increase in right ventricular wall thickness. 6. Left atrial size was severely dilated. 7. Right atrial size was severely dilated. 8. The mitral valve is normal in structure. Mild to moderate mitral valve regurgitation. No evidence of mitral stenosis. 9. The tricuspid valve is normal in structure. 10. The aortic valve is normal in structure. Aortic valve regurgitation is not visualized. Mild to moderate aortic valve sclerosis/calcification without any evidence of aortic stenosis. 11. The pulmonic valve was normal in structure. Pulmonic valve regurgitation is trivial. 12. Aortic dilatation noted. 13. There is mild dilatation of the ascending aorta measuring 42 mm. 14. Compared with the echo 02/2018, the ascending aorta has increased from 3.8 cm to 4.2 cm. 15. Moderately elevated pulmonary artery systolic pressure. 16. A pacer wire is visualized. 17. The inferior vena cava is normal in size with greater than 50% respiratory variability, suggesting right atrial pressure of 3 mmHg  Patient Profile     86 y.o. male with chronic systolic and diastolic heart failure (LVEF 30 to 35%), status post BiV pacemaker, permanent atrial fibrillation on Eliquis, hypertension, hyperlipidemia, and diabetes admitted with acute on chronic systolic and diastolic heart failure.  Assessment & Plan    # Acute on chronic systolic and diastolic heart failure: NICM. S/p biV PPM.  He was net -930 mL yesterday.  -9.2L this admission.  Renal function is worsening.  However he still appears to be volume overloaded.  Chest x-ray is concerning for persistent edema and he still has lower extremity edema and hyponatremia.  BNP pending.  We will try to push a little harder with diuresis.   Increase Lasix to 80 mg twice daily.  Continue Unna boots.    # Permanent atrial fibrillation:  Continue Eliquis and metoprolol.  # Hypertension: BP running low but stable.  Continue metoprolol.  # Hyperlipidmia:  Continue atorvastatin.  # Acute on chronic renal failure: Continue diuresis as above.  Monitor renal function closely.  # Hyperlipidemia: Continue atorvastatin.  # Dementia: Continue donepezil.        For questions or updates, please contact [place] Please consult [email or web address] for contact info under        Signed, Skeet Latch, MD  01/03/2020, 8:20 AM

## 2020-01-03 NOTE — Plan of Care (Signed)
°  Problem: Education: °Goal: Ability to demonstrate management of disease process will improve °Outcome: Progressing °Goal: Ability to verbalize understanding of medication therapies will improve °Outcome: Progressing °Goal: Individualized Educational Video(s) °Outcome: Progressing °  °

## 2020-01-04 ENCOUNTER — Ambulatory Visit: Payer: Medicare HMO | Admitting: Cardiology

## 2020-01-04 NOTE — Progress Notes (Addendum)
Progress Note  Patient Name: [name] Date of Encounter: 01/04/2020  Primary Cardiologist: Olga Millers, MD   Subjective   SOB continues to improve.  Still gets SOB when up moving around. Denies any chest pain  Inpatient Medications    Scheduled Meds: . allopurinol  100 mg Oral QPC breakfast  . apixaban  5 mg Oral BID PC  . atorvastatin  20 mg Oral QPC supper  . docusate sodium  100 mg Oral BID  . donepezil  5 mg Oral QHS  . magnesium oxide  400 mg Oral Daily  . Melatonin  6 mg Oral QHS  . metoprolol succinate  37.5 mg Oral Daily  . sodium chloride flush  3 mL Intravenous Q12H  . sodium chloride flush  3 mL Intravenous Q12H  . tamsulosin  0.4 mg Oral BID PC   Continuous Infusions: . sodium chloride    . sodium chloride     PRN Meds: sodium chloride, sodium chloride, acetaminophen, ipratropium-albuterol, ondansetron (ZOFRAN) IV, sodium chloride flush, sodium chloride flush, zolpidem   Vital Signs    Vitals:   01/03/20 2140 01/04/20 0336 01/04/20 0414 01/04/20 0500  BP: 104/72 98/77    Pulse: (!) 50 (!) 42 94   Resp: 20 20    Temp: 97.8 F (36.6 C)     TempSrc: Oral     SpO2: 99% 99%    Weight:    99.4 kg  Height:        Intake/Output Summary (Last 24 hours) at 01/04/2020 0846 Last data filed at 01/04/2020 0600 Gross per 24 hour  Intake 1250 ml  Output 775 ml  Net 475 ml   Last 3 Weights 01/04/2020 01/03/2020 01/02/2020  Weight (lbs) 219 lb 2.2 oz 223 lb 1.7 oz 225 lb 5 oz  Weight (kg) 99.4 kg 101.2 kg 102.2 kg      Telemetry    Atrial fibrillation with CVR Personally Reviewed  ECG    No new EKG to review - Personally Reviewed  Physical Exam   GEN: Well nourished, well developed in no acute distress HEENT: Normal NECK: No JVD; No carotid bruits LYMPHATICS: No lymphadenopathy CARDIAC:irregularly irregular, no murmurs, rubs, gallops RESPIRATORY:  Clear to auscultation without rales, wheezing or rhonchi  ABDOMEN: Soft,  non-tender, non-distended MUSCULOSKELETAL:  1+ edema; No deformity  SKIN: Warm and dry NEUROLOGIC:  Alert and oriented x 3 PSYCHIATRIC:  Normal affect    Labs    High Sensitivity Troponin:   Recent Labs  Lab 12/11/19 1338 12/11/19 1638  TROPONINIHS 41* 42*      Chemistry Recent Labs  Lab 01/01/20 0536 01/02/20 0549 01/03/20 0523  NA 132* 131* 130*  K 4.2 4.3 4.6  CL 91* 91* 93*  CO2 32 30 24  GLUCOSE 141* 138* 171*  BUN 40* 44* 51*  CREATININE 1.18 1.25* 1.42*  CALCIUM 8.5* 8.9 8.9  GFRNONAA 55* 51* 44*  GFRAA >60 59* 51*  ANIONGAP 9 10 13      Hematology Recent Labs  Lab 12/29/19 0914 01/01/20 0536  WBC 6.5 6.7  RBC 4.32 4.22  HGB 13.5 13.1  HCT 40.6 39.4  MCV 94.0 93.4  MCH 31.3 31.0  MCHC 33.3 33.2  RDW 14.2 14.3  PLT 105* 102*    BNP Recent Labs  Lab 12/30/19 0326 01/03/20 0849  BNP 1,321.1* 1,494.6*     DDimer No results for input(s): DDIMER in the last 168 hours.   Radiology    DG CHEST PORT 1  VIEW  Result Date: 01/03/2020 CLINICAL DATA:  Shortness of breath EXAM: PORTABLE CHEST 1 VIEW COMPARISON:  12/31/2019 FINDINGS: Cardiomegaly with mild interstitial edema. Moderate layering right pleural effusion. Small left pleural effusion. Left subclavian ICD. When compared to the prior, the interstitial edema may be mildly improved. IMPRESSION: Cardiomegaly with mild interstitial edema, mildly improved. Moderate right and small left pleural effusions. Electronically Signed   By: Julian Hy M.D.   On: 01/03/2020 08:36    Cardiac Studies   Echo 12/12/2019  1. Left ventricular ejection fraction, by visual estimation, is 30 to 35%. The left ventricle has moderate to severely decreased function. There is no left ventricular hypertrophy. 2. Definity contrast agent was given IV to delineate the left ventricular endocardial borders. 3. Left ventricular diastolic function could not be evaluated. 4. The left ventricle demonstrates global  hypokinesis. 5. Global right ventricle has normal systolic function.The right ventricular size is normal. No increase in right ventricular wall thickness. 6. Left atrial size was severely dilated. 7. Right atrial size was severely dilated. 8. The mitral valve is normal in structure. Mild to moderate mitral valve regurgitation. No evidence of mitral stenosis. 9. The tricuspid valve is normal in structure. 10. The aortic valve is normal in structure. Aortic valve regurgitation is not visualized. Mild to moderate aortic valve sclerosis/calcification without any evidence of aortic stenosis. 11. The pulmonic valve was normal in structure. Pulmonic valve regurgitation is trivial. 12. Aortic dilatation noted. 13. There is mild dilatation of the ascending aorta measuring 42 mm. 14. Compared with the echo 02/2018, the ascending aorta has increased from 3.8 cm to 4.2 cm. 15. Moderately elevated pulmonary artery systolic pressure. 16. A pacer wire is visualized. 17. The inferior vena cava is normal in size with greater than 50% respiratory variability, suggesting right atrial pressure of 3 mmHg  Patient Profile     86 y.o. male with chronic systolic and diastolic heart failure (LVEF 30 to 35%), status post BiV pacemaker, permanent atrial fibrillation on Eliquis, hypertension, hyperlipidemia, and diabetes admitted with acute on chronic systolic and diastolic heart failure.  Assessment & Plan    1. Acute on chronic systolic and diastolic heart failure: -secondary to nonischemic DCM -S/p biV PPM.   -yesterday renal function was trending up but appeared volume overloaded with cxray showing persistent edema, hyponatremia, LE edema and BNP 1494. -Lasix increased to 80 mg twice daily yesterday -he put out 775cc yesterday and is net neg 8.7L since admit -weight down 6lbs from admit -BMET pending this am  2.Permanent atrial fibrillation:  -heart rate controlled -continue Toprol 37.5mg  daily and  Apixaban 5mg  BID  3. Hypertension: -BP stable but soft at 98/77-105/70 -continue BB  4. Hyperlipidmia:  --Continue atorvastatin.  5. Acute on chronic renal failure: -creatinine was trending upward yesterday but felt to be related to worsening volume overload -BMET pending this am -Continue diuresis as above.   -Monitor renal function closely.  6. Hyperlipidemia:  -Continue atorvastatin.    I have spent a total of 35 minutes with patient reviewing hospital notes , telemetry, EKGs, labs and examining patient as well as establishing an assessment and plan that was discussed with the patient.  > 50% of time was spent in direct patient care.    For questions or updates, please contact [place] Please consult [email or web address] for contact info under        Signed, Fransico Him, MD  01/04/2020, 8:46 AM

## 2020-01-04 NOTE — Plan of Care (Signed)
  Problem: Education: Goal: Ability to demonstrate management of disease process will improve Outcome: Progressing Goal: Ability to verbalize understanding of medication therapies will improve Outcome: Progressing Goal: Individualized Educational Video(s) Outcome: Progressing   Problem: Activity: Goal: Capacity to carry out activities will improve Outcome: Progressing   Problem: Cardiac: Goal: Ability to achieve and maintain adequate cardiopulmonary perfusion will improve Outcome: Progressing   Problem: Education: Goal: Knowledge of General Education information will improve Description: Including pain rating scale, medication(s)/side effects and non-pharmacologic comfort measures Outcome: Progressing   Problem: Health Behavior/Discharge Planning: Goal: Ability to manage health-related needs will improve Outcome: Progressing   Problem: Clinical Measurements: Goal: Ability to maintain clinical measurements within normal limits will improve Outcome: Progressing Goal: Will remain free from infection Outcome: Progressing Goal: Diagnostic test results will improve Outcome: Progressing Goal: Respiratory complications will improve Outcome: Progressing Goal: Cardiovascular complication will be avoided Outcome: Progressing   Problem: Activity: Goal: Risk for activity intolerance will decrease Outcome: Progressing   Problem: Coping: Goal: Level of anxiety will decrease Outcome: Progressing   Problem: Safety: Goal: Ability to remain free from injury will improve Outcome: Progressing   Problem: Skin Integrity: Goal: Risk for impaired skin integrity will decrease Outcome: Progressing   

## 2020-01-04 NOTE — Progress Notes (Signed)
Physical Therapy Treatment Patient Details Name: [name] MRN: [id number] DOB: [birth year] Today's Date: 01/04/2020    History of Present Illness Patient is a 86 y/o male who presents with Acute on chronic systolic CHF/nonischemic cardiomyopathy. PMh includes systolic dysfunction CHF, nonischemic cardiomyopathy, pacemaker placement, HTN, DM, HLD, frequent PVCs.    PT Comments    Patient seen for mobility progression. Pt in bed and agreeable to participate in therapy. Pt requires min guard/min A for functional transfer/gait training using SPC this session. Pt with more unsteady gait compared to previous session. Pt agreeable to use of RW upon d/c home and reports he already has a RW.  SpO2 95% or > on RA. Pt continues to become SOB with mobility but recovers quickly once seated.  Continue to recommend HHPT for further skilled PT services to maximize independence and safety with mobility.   Follow Up Recommendations  Home health PT;Supervision for mobility/OOB     Equipment Recommendations  None recommended by PT;Other (comment)(pt reports having walkers at home)    Recommendations for Other Services OT consult     Precautions / Restrictions Precautions Precautions: Fall Restrictions Weight Bearing Restrictions: No    Mobility  Bed Mobility Overal bed mobility: Modified Independent Bed Mobility: Supine to Sit           General bed mobility comments: increased time/effort; use of rail  Transfers Overall transfer level: Needs assistance Equipment used: Straight cane Transfers: Sit to/from Stand Sit to Stand: Min assist         General transfer comment: assist to power up into standing and gain balance upon standing  Ambulation/Gait Ambulation/Gait assistance: Min guard;Min assist Gait Distance (Feet): 160 Feet Assistive device: Straight cane Gait Pattern/deviations: Decreased stride length;Trunk flexed;Wide base of support;Step-through pattern Gait  velocity: decreased   General Gait Details: cues for safety and gait speed; when fatigued began moving too quickly; pt more unsteady today compared to previous session and min A needed intermittently    Stairs             Wheelchair Mobility    Modified Rankin (Stroke Patients Only)       Balance Overall balance assessment: Needs assistance Sitting-balance support: Feet supported;No upper extremity supported Sitting balance-Leahy Scale: Fair     Standing balance support: During functional activity Standing balance-Leahy Scale: Poor                              Cognition Arousal/Alertness: Awake/alert Behavior During Therapy: WFL for tasks assessed/performed Overall Cognitive Status: Within Functional Limits for tasks assessed                                 General Comments: Pt HOH; pt agreeable to use of RW upon d/c home to decrease risk of falls      Exercises General Exercises - Upper Extremity Shoulder Flexion: AROM;Both;Seated Elbow Flexion: AROM;Both;Seated General Exercises - Lower Extremity Long Arc Quad: AROM;Both;Seated Hip Flexion/Marching: AROM;Both;Seated Toe Raises: AROM;Both;Seated Heel Raises: AROM;Both;Seated    General Comments General comments (skin integrity, edema, etc.): SpO2 95% or > on RA       Pertinent Vitals/Pain Pain Assessment: Faces Faces Pain Scale: Hurts a little bit Pain Location: generalized Pain Descriptors / Indicators: Discomfort;Sore Pain Intervention(s): Limited activity within patient's tolerance;Monitored during session;Repositioned    Home Living  Prior Function            PT Goals (current goals can now be found in the care plan section) Progress towards PT goals: Progressing toward goals    Frequency    Min 3X/week      PT Plan Current plan remains appropriate    Co-evaluation              AM-PAC PT "6 Clicks" Mobility   Outcome  Measure  Help needed turning from your back to your side while in a flat bed without using bedrails?: A Little Help needed moving from lying on your back to sitting on the side of a flat bed without using bedrails?: A Little Help needed moving to and from a bed to a chair (including a wheelchair)?: A Little Help needed standing up from a chair using your arms (e.g., wheelchair or bedside chair)?: A Little Help needed to walk in hospital room?: A Little Help needed climbing 3-5 steps with a railing? : A Little 6 Click Score: 18    End of Session Equipment Utilized During Treatment: Gait belt Activity Tolerance: Patient tolerated treatment well Patient left: with call bell/phone within reach;in chair;with chair alarm set Nurse Communication: Mobility status PT Visit Diagnosis: Unsteadiness on feet (R26.81);Difficulty in walking, not elsewhere classified (R26.2)     Time: 4235-3614 PT Time Calculation (min) (ACUTE ONLY): 26 min  Charges:  $Gait Training: 8-22 mins $Therapeutic Exercise: 8-22 mins                     Earney Navy, PTA Acute Rehabilitation Services Pager: [phone] Office: [phone]     Darliss Cheney 01/04/2020, 10:19 AM

## 2020-01-05 LAB — BASIC METABOLIC PANEL
Anion gap: 11 (ref 5–15)
BUN: 67 mg/dL — ABNORMAL HIGH (ref 8–23)
CO2: 30 mmol/L (ref 22–32)
Calcium: 8.8 mg/dL — ABNORMAL LOW (ref 8.9–10.3)
Chloride: 87 mmol/L — ABNORMAL LOW (ref 98–111)
Creatinine, Ser: 1.78 mg/dL — ABNORMAL HIGH (ref 0.61–1.24)
GFR calc Af Amer: 39 mL/min — ABNORMAL LOW (ref >60)
GFR calc non Af Amer: 33 mL/min — ABNORMAL LOW (ref >60)
Glucose, Bld: 230 mg/dL — ABNORMAL HIGH (ref 70–99)
Potassium: 4.7 mmol/L (ref 3.5–5.1)
Sodium: 128 mmol/L — ABNORMAL LOW (ref 135–145)

## 2020-01-05 NOTE — Plan of Care (Signed)
  Problem: Education: Goal: Ability to demonstrate management of disease process will improve Outcome: Progressing Goal: Ability to verbalize understanding of medication therapies will improve Outcome: Progressing Goal: Individualized Educational Video(s) Outcome: Progressing   Problem: Activity: Goal: Capacity to carry out activities will improve Outcome: Progressing   Problem: Cardiac: Goal: Ability to achieve and maintain adequate cardiopulmonary perfusion will improve Outcome: Progressing   Problem: Education: Goal: Knowledge of General Education information will improve Description: Including pain rating scale, medication(s)/side effects and non-pharmacologic comfort measures Outcome: Progressing   Problem: Health Behavior/Discharge Planning: Goal: Ability to manage health-related needs will improve Outcome: Progressing   Problem: Clinical Measurements: Goal: Ability to maintain clinical measurements within normal limits will improve Outcome: Progressing Goal: Will remain free from infection Outcome: Progressing Goal: Diagnostic test results will improve Outcome: Progressing Goal: Respiratory complications will improve Outcome: Progressing Goal: Cardiovascular complication will be avoided Outcome: Progressing   Problem: Activity: Goal: Risk for activity intolerance will decrease Outcome: Progressing   Problem: Coping: Goal: Level of anxiety will decrease Outcome: Progressing   Problem: Safety: Goal: Ability to remain free from injury will improve Outcome: Progressing   Problem: Skin Integrity: Goal: Risk for impaired skin integrity will decrease Outcome: Progressing   

## 2020-01-05 NOTE — Progress Notes (Signed)
Progress Note  Patient Name: [facility] Date of Encounter: 01/05/2020  Primary Cardiologist: Olga Millers, MD   Subjective   Continues to have SOB but it is slowly improving.  Denies any chest pain  Inpatient Medications    Scheduled Meds: . allopurinol  100 mg Oral QPC breakfast  . apixaban  5 mg Oral BID PC  . atorvastatin  20 mg Oral QPC supper  . docusate sodium  100 mg Oral BID  . donepezil  5 mg Oral QHS  . magnesium oxide  400 mg Oral Daily  . Melatonin  6 mg Oral QHS  . metoprolol succinate  37.5 mg Oral Daily  . sodium chloride flush  3 mL Intravenous Q12H  . sodium chloride flush  3 mL Intravenous Q12H  . tamsulosin  0.4 mg Oral BID PC   Continuous Infusions: . sodium chloride    . sodium chloride     PRN Meds: sodium chloride, sodium chloride, acetaminophen, ipratropium-albuterol, ondansetron (ZOFRAN) IV, sodium chloride flush, sodium chloride flush, zolpidem   Vital Signs    Vitals:   01/04/20 2037 01/05/20 0400 01/05/20 0456 01/05/20 0928  BP: 95/73  106/73 104/80  Pulse: 64  (!) 52 99  Resp: 20  14   Temp: 98.2 F (36.8 C)  (!) 97.4 F (36.3 C)   TempSrc: Oral  Oral   SpO2: 99%  100% 97%  Weight:  103.1 kg    Height:        Intake/Output Summary (Last 24 hours) at 01/05/2020 0943 Last data filed at 01/05/2020 0849 Gross per 24 hour  Intake 1440 ml  Output 1210 ml  Net 230 ml   Last 3 Weights 01/05/2020 01/04/2020 01/03/2020  Weight (lbs) 227 lb 4.7 oz 219 lb 2.2 oz 223 lb 1.7 oz  Weight (kg) 103.1 kg 99.4 kg 101.2 kg      Telemetry    Afib with CVR Personally Reviewed  ECG    No new EKG to review - Personally Reviewed  Physical Exam   GEN: Well nourished, well developed in no acute distress HEENT: Normal NECK: No JVD; No carotid bruits LYMPHATICS: No lymphadenopathy CARDIAC:RRR, no murmurs, rubs, gallops RESPIRATORY:  Clear to auscultation without rales, wheezing or rhonchi  ABDOMEN: Soft, non-tender,  non-distended MUSCULOSKELETAL:  No edema; No deformity  SKIN: Warm and dry NEUROLOGIC:  Alert and oriented x 3 PSYCHIATRIC:  Normal affect    Labs    High Sensitivity Troponin:   Recent Labs  Lab 12/11/19 1338 12/11/19 1638  TROPONINIHS 41* 42*      Chemistry Recent Labs  Lab 01/01/20 0536 01/02/20 0549 01/03/20 0523  NA 132* 131* 130*  K 4.2 4.3 4.6  CL 91* 91* 93*  CO2 32 30 24  GLUCOSE 141* 138* 171*  BUN 40* 44* 51*  CREATININE 1.18 1.25* 1.42*  CALCIUM 8.5* 8.9 8.9  GFRNONAA 55* 51* 44*  GFRAA >60 59* 51*  ANIONGAP 9 10 13      Hematology Recent Labs  Lab 01/01/20 0536  WBC 6.7  RBC 4.22  HGB 13.1  HCT 39.4  MCV 93.4  MCH 31.0  MCHC 33.2  RDW 14.3  PLT 102*    BNP Recent Labs  Lab 12/30/19 0326 01/03/20 0849  BNP 1,321.1* 1,494.6*     DDimer No results for input(s): DDIMER in the last 168 hours.   Radiology    No results found.  Cardiac Studies   Echo 12/12/2019  1. Left ventricular ejection fraction,  by visual estimation, is 30 to 35%. The left ventricle has moderate to severely decreased function. There is no left ventricular hypertrophy. 2. Definity contrast agent was given IV to delineate the left ventricular endocardial borders. 3. Left ventricular diastolic function could not be evaluated. 4. The left ventricle demonstrates global hypokinesis. 5. Global right ventricle has normal systolic function.The right ventricular size is normal. No increase in right ventricular wall thickness. 6. Left atrial size was severely dilated. 7. Right atrial size was severely dilated. 8. The mitral valve is normal in structure. Mild to moderate mitral valve regurgitation. No evidence of mitral stenosis. 9. The tricuspid valve is normal in structure. 10. The aortic valve is normal in structure. Aortic valve regurgitation is not visualized. Mild to moderate aortic valve sclerosis/calcification without any evidence of aortic stenosis. 11. The  pulmonic valve was normal in structure. Pulmonic valve regurgitation is trivial. 12. Aortic dilatation noted. 13. There is mild dilatation of the ascending aorta measuring 42 mm. 14. Compared with the echo 02/2018, the ascending aorta has increased from 3.8 cm to 4.2 cm. 15. Moderately elevated pulmonary artery systolic pressure. 16. A pacer wire is visualized. 17. The inferior vena cava is normal in size with greater than 50% respiratory variability, suggesting right atrial pressure of 3 mmHg  Patient Profile     86 y.o. male with chronic systolic and diastolic heart failure (LVEF 30 to 35%), status post BiV pacemaker, permanent atrial fibrillation on Eliquis, hypertension, hyperlipidemia, and diabetes admitted with acute on chronic systolic and diastolic heart failure.  Assessment & Plan    1. Acute on chronic systolic and diastolic heart failure: -secondary to nonischemic DCM -s/p biV PPM.   -yesterday renal function was trending up but appeared volume overloaded with cxray showing persistent edema, hyponatremia, LE edema and BNP 1494. -Lasix increased to 80 mg twice daily  -he put out 1.2L yesterday and is net neg 8.2L since admit -weight appears inaccurate -check BMET this am -his SOB is stable and he has no LE edema -will see what BMET is and decide on changing to PO lasix  2.Permanent atrial fibrillation:  -heart rate borderline  controlled -continue Toprol 37.5mg  daily and Apixaban 5mg  BID -cannot increase BB further due to soft BP  3. Hypertension: -BP remains soft at 104/40mmHg -continue BB  4. Hyperlipidmia:  --Continue atorvastatin.  5. Acute on chronic renal failure: -creatinine was trending upward  but felt to be related to worsening volume overload -check BMET this am -Continue diuresis as above.   -Monitor renal function closely.   For questions or updates, please contact [place] Please consult [email or web address] for contact info under         Signed, Fransico Him, MD  01/05/2020, 9:43 AM

## 2020-01-05 NOTE — Progress Notes (Signed)
Physical Therapy Treatment Patient Details Name: [name] MRN: [id number] DOB: Apr 25, 1929 Today's Date: 01/05/2020    History of Present Illness Patient is a 86 y/o male who presents with Acute on chronic systolic CHF/nonischemic cardiomyopathy. PMh includes systolic dysfunction CHF, nonischemic cardiomyopathy, pacemaker placement, HTN, DM, HLD, frequent PVCs.    PT Comments    Patient seen for mobility progression. Pt in bed upon arrival and appears to have increased work of breathing compared to yesterday's session however did improve with pt upright. Pt on 1L O2 via [place] upon arrival and used during session for comfort. Pt with increased activity tolerance and less physical assistance needed with use of rollator this session. Current plan remains appropriate.    Follow Up Recommendations  Home health PT;Supervision for mobility/OOB     Equipment Recommendations  None recommended by PT (pt reports having DME at home)   Recommendations for Other Services       Precautions / Restrictions Precautions Precautions: Fall Restrictions Weight Bearing Restrictions: No    Mobility  Bed Mobility Overal bed mobility: Modified Independent Bed Mobility: Supine to Sit;Sit to Supine           General bed mobility comments: increased time/effort; use of rail  Transfers Overall transfer level: Needs assistance Equipment used: 4-wheeled walker(rollator) Transfers: Sit to/from Stand Sit to Stand: Min assist         General transfer comment: cues for hand placement; assist to power up into standing and gain balance upon standing  Ambulation/Gait Ambulation/Gait assistance: Min guard Gait Distance (Feet): 240 Feet Assistive device: 4-wheeled walker Gait Pattern/deviations: Decreased stride length;Wide base of support;Step-through pattern;Trunk flexed Gait velocity: decreased   General Gait Details: cues for upright posture and safe use of AD especially when  turning   Stairs             Wheelchair Mobility    Modified Rankin (Stroke Patients Only)       Balance Overall balance assessment: Needs assistance Sitting-balance support: Feet supported;No upper extremity supported Sitting balance-Leahy Scale: Fair     Standing balance support: During functional activity Standing balance-Leahy Scale: Poor                              Cognition Arousal/Alertness: Awake/alert Behavior During Therapy: WFL for tasks assessed/performed Overall Cognitive Status: Within Functional Limits for tasks assessed                                 General Comments: Pt HOH      Exercises      General Comments General comments (skin integrity, edema, etc.): pt with increased work of breathing upon arrival so kept on 1L O2 via [place] for mobility      Pertinent Vitals/Pain Pain Assessment: No/denies pain    Home Living                      Prior Function            PT Goals (current goals can now be found in the care plan section) Progress towards PT goals: Progressing toward goals    Frequency    Min 3X/week      PT Plan Current plan remains appropriate    Co-evaluation              AM-PAC PT "6 Clicks" Mobility  Outcome Measure  Help needed turning from your back to your side while in a flat bed without using bedrails?: A Little Help needed moving from lying on your back to sitting on the side of a flat bed without using bedrails?: A Little Help needed moving to and from a bed to a chair (including a wheelchair)?: A Little Help needed standing up from a chair using your arms (e.g., wheelchair or bedside chair)?: A Little Help needed to walk in hospital room?: A Little Help needed climbing 3-5 steps with a railing? : A Little 6 Click Score: 18    End of Session Equipment Utilized During Treatment: Gait belt Activity Tolerance: Patient tolerated treatment well Patient left: with  call bell/phone within reach;in bed Nurse Communication: Mobility status PT Visit Diagnosis: Unsteadiness on feet (R26.81);Difficulty in walking, not elsewhere classified (R26.2)     Time: 1010-1036 PT Time Calculation (min) (ACUTE ONLY): 26 min  Charges:  $Gait Training: 23-37 mins                     Erline Levine, PTA Acute Rehabilitation Services Pager: [phone] Office: [phone]     Carolynne Edouard 01/05/2020, 11:32 AM

## 2020-01-06 ENCOUNTER — Inpatient Hospital Stay (HOSPITAL_COMMUNITY): Payer: Medicare HMO

## 2020-01-06 LAB — BASIC METABOLIC PANEL
Anion gap: 12 (ref 5–15)
BUN: 70 mg/dL — ABNORMAL HIGH (ref 8–23)
CO2: 26 mmol/L (ref 22–32)
Calcium: 8.8 mg/dL — ABNORMAL LOW (ref 8.9–10.3)
Chloride: 89 mmol/L — ABNORMAL LOW (ref 98–111)
Creatinine, Ser: 1.45 mg/dL — ABNORMAL HIGH (ref 0.61–1.24)
GFR calc Af Amer: 49 mL/min — ABNORMAL LOW (ref >60)
GFR calc non Af Amer: 43 mL/min — ABNORMAL LOW (ref >60)
Glucose, Bld: 177 mg/dL — ABNORMAL HIGH (ref 70–99)
Potassium: 4.7 mmol/L (ref 3.5–5.1)
Sodium: 127 mmol/L — ABNORMAL LOW (ref 135–145)

## 2020-01-06 LAB — BRAIN NATRIURETIC PEPTIDE: B Natriuretic Peptide: 1374.1 pg/mL — ABNORMAL HIGH (ref 0.0–100.0)

## 2020-01-06 MED ORDER — FUROSEMIDE 10 MG/ML IJ SOLN
40.0000 mg | Freq: Two times a day (BID) | INTRAMUSCULAR | Status: DC
  Administered 2020-01-06 – 2020-01-07 (×3): 40 mg via INTRAVENOUS
  Filled 2020-01-06 (×3): qty 4

## 2020-01-06 NOTE — Progress Notes (Addendum)
Dressing on right lower extremity and left lower extremity changed. Right lower extremity was cleansed and pat dried. Then, Xeroform was applied with ABD pad, kerlix, and ACE wrap beginning just behind toes to just below knee. Left lower extremity was cleansed and pat dried, with kerlix and ACE wrap beginning just behind toes ending just below knee. Patient tolerated well with no complaints of pain. Patient's daughter currently at bedside, nurse updated on plan of care and is requesting update from MD. Nurse paged PA.

## 2020-01-06 NOTE — Progress Notes (Addendum)
Progress Note  Patient Name: [name] Date of Encounter: 01/06/2020  Primary Cardiologist: Olga Millers, MD   Subjective   SOB continue to improve.    Inpatient Medications    Scheduled Meds: . allopurinol  100 mg Oral QPC breakfast  . apixaban  5 mg Oral BID PC  . atorvastatin  20 mg Oral QPC supper  . docusate sodium  100 mg Oral BID  . donepezil  5 mg Oral QHS  . magnesium oxide  400 mg Oral Daily  . Melatonin  6 mg Oral QHS  . metoprolol succinate  37.5 mg Oral Daily  . sodium chloride flush  3 mL Intravenous Q12H  . sodium chloride flush  3 mL Intravenous Q12H  . tamsulosin  0.4 mg Oral BID PC   Continuous Infusions: . sodium chloride    . sodium chloride     PRN Meds: sodium chloride, sodium chloride, acetaminophen, ipratropium-albuterol, ondansetron (ZOFRAN) IV, sodium chloride flush, sodium chloride flush, zolpidem   Vital Signs    Vitals:   01/05/20 1201 01/05/20 1942 01/06/20 0430 01/06/20 0847  BP: 108/79 (!) 123/95 (!) 114/94 (!) 151/124  Pulse: (!) 51 100 73 90  Resp:  20 18   Temp: 98 F (36.7 C) 98.6 F (37 C) 98.4 F (36.9 C)   TempSrc:      SpO2:  98% 97%   Weight:   103.5 kg   Height:        Intake/Output Summary (Last 24 hours) at 01/06/2020 3532 Last data filed at 01/06/2020 0024 Gross per 24 hour  Intake 820 ml  Output 750 ml  Net 70 ml   Last 3 Weights 01/06/2020 01/05/2020 01/04/2020  Weight (lbs) 228 lb 2.8 oz 227 lb 4.7 oz 219 lb 2.2 oz  Weight (kg) 103.5 kg 103.1 kg 99.4 kg      Telemetry    Atrial fibrillation with CVR Personally Reviewed  ECG    No new EKG to review- Personally Reviewed  Physical Exam   GEN: Well nourished, well developed in no acute distress HEENT: Normal NECK: No JVD; No carotid bruits LYMPHATICS: No lymphadenopathy CARDIAC:RRR, no murmurs, rubs, gallops RESPIRATORY:  Clear to auscultation without rales, wheezing or rhonchi  ABDOMEN: Soft, non-tender,  non-distended MUSCULOSKELETAL:  No edema; No deformity  SKIN: Warm and dry NEUROLOGIC:  Alert and oriented x 3 PSYCHIATRIC:  Normal affect    Labs    High Sensitivity Troponin:   Recent Labs  Lab 12/11/19 1338 12/11/19 1638  TROPONINIHS 41* 42*      Chemistry Recent Labs  Lab 01/03/20 0523 01/05/20 1019 01/06/20 0440  NA 130* 128* 127*  K 4.6 4.7 4.7  CL 93* 87* 89*  CO2 24 30 26   GLUCOSE 171* 230* 177*  BUN 51* 67* 70*  CREATININE 1.42* 1.78* 1.45*  CALCIUM 8.9 8.8* 8.8*  GFRNONAA 44* 33* 43*  GFRAA 51* 39* 49*  ANIONGAP 13 11 12      Hematology Recent Labs  Lab 01/01/20 0536  WBC 6.7  RBC 4.22  HGB 13.1  HCT 39.4  MCV 93.4  MCH 31.0  MCHC 33.2  RDW 14.3  PLT 102*    BNP Recent Labs  Lab 01/03/20 0849  BNP 1,494.6*     DDimer No results for input(s): DDIMER in the last 168 hours.   Radiology    No results found.  Cardiac Studies   Echo 12/12/2019  1. Left ventricular ejection fraction, by visual estimation, is 30 to  35%. The left ventricle has moderate to severely decreased function. There is no left ventricular hypertrophy. 2. Definity contrast agent was given IV to delineate the left ventricular endocardial borders. 3. Left ventricular diastolic function could not be evaluated. 4. The left ventricle demonstrates global hypokinesis. 5. Global right ventricle has normal systolic function.The right ventricular size is normal. No increase in right ventricular wall thickness. 6. Left atrial size was severely dilated. 7. Right atrial size was severely dilated. 8. The mitral valve is normal in structure. Mild to moderate mitral valve regurgitation. No evidence of mitral stenosis. 9. The tricuspid valve is normal in structure. 10. The aortic valve is normal in structure. Aortic valve regurgitation is not visualized. Mild to moderate aortic valve sclerosis/calcification without any evidence of aortic stenosis. 11. The pulmonic valve was  normal in structure. Pulmonic valve regurgitation is trivial. 12. Aortic dilatation noted. 13. There is mild dilatation of the ascending aorta measuring 42 mm. 14. Compared with the echo 02/2018, the ascending aorta has increased from 3.8 cm to 4.2 cm. 15. Moderately elevated pulmonary artery systolic pressure. 16. A pacer wire is visualized. 17. The inferior vena cava is normal in size with greater than 50% respiratory variability, suggesting right atrial pressure of 3 mmHg  Patient Profile     86 y.o. male with chronic systolic and diastolic heart failure (LVEF 30 to 35%), status post BiV pacemaker, permanent atrial fibrillation on Eliquis, hypertension, hyperlipidemia, and diabetes admitted with acute on chronic systolic and diastolic heart failure.  Assessment & Plan    1. Acute on chronic systolic and diastolic heart failure: -secondary to nonischemic DCM -s/p biV PPM.   -Lasix increased to 80 mg twice daily but appears to in advertantly fell off his MAR when Dr. Oval Linsey increased to 80mg  BID on 1/24 so has been of diuretics for 2 days -renal function improving with  Cr decreased from 1.78>1.45 -he put out 1L yesterday and is net neg 8.2L since admit -weight is up and he has crackles on exam -Na trending downward likely related to volume overload -check BNP and Cxray today -restart lasix 40mg  IV BID -follow renal function closely  2.Permanent atrial fibrillation:  -heart rate controlled -continue Toprol 37.5mg  daily and Apixaban 5mg  BID  3. Hypertension: -DBP has been elevated in the 90's -BP this am 151/124 ? accuracy -continue BB  4. Hyperlipidmia:  --Continue atorvastatin.  5. Acute on chronic renal failure: -creatinine was trending upward  but felt to be related to worsening volume overload -creatinine improving and now 1.45 -Continue diuresis as above.   -Monitor renal function closely.  I have spent a total of 30 minutes with patient reviewing hospital notes ,  telemetry, EKGs, labs and examining patient as well as establishing an assessment and plan that was discussed with the patient.  > 50% of time was spent in direct patient care.     For questions or updates, please contact [place] Please consult [email or web address] for contact info under        Signed, Fransico Him, MD  01/06/2020, 9:07 AM

## 2020-01-06 NOTE — Consult Note (Signed)
   [facility] CM Inpatient Consult   01/06/2020  [facility] [place] July 06, 1929 [id number]   Patient screened for high risk score of 25% for unplanned readmission re-hospitalization in the past 30 days with long length of stay.  Chart review  to check for progress, disposition, and if potential [facility] Care Management services are needed.    Review of patient's medical record for admission from MD patient profile note today which includes but not limited to:   86 y.o. male with chronic systolic and diastolic heart failure (LVEF 30 to 35%), status post BiV pacemaker, permanent atrial fibrillation on Eliquis, hypertension, hyperlipidemia, and diabetes admitted with acute on chronic systolic and diastolic heart failure.  Primary Care Provider is:  Wynn Banker, MD  With [facility], this office is listed to provide the transition of care follow up calls and appointments.  Pharmacy is: [facility]  Plan:  Follow disposition needs currently for home health with [facility] noted.  Continue to follow progress for recommendations for post hospital needs..    Please place a [facility] Care Management consult as appropriate and for questions contact:   Charlesetta Shanks, RN BSN CCM [facility]  [phone] business mobile phone Toll free office [phone]  Fax number: [phone] [email or web address]

## 2020-01-06 NOTE — Progress Notes (Signed)
PT Cancellation Note  Patient Details Name: Redford Behrle MRN: [id number] DOB: [birth year]   Cancelled Treatment:    Reason Eval/Treat Not Completed: Patient declined, no reason specified. Patient is very lethargic today. Sleeping most of morning. He ambulated with mobility tech this am. Declined PT x2 this morning. Will try back again later if time allows.    Leemon Ayala 01/06/2020, 12:26 PM

## 2020-01-07 ENCOUNTER — Inpatient Hospital Stay (HOSPITAL_COMMUNITY): Payer: Medicare HMO

## 2020-01-07 DIAGNOSIS — R0602 Shortness of breath: Secondary | ICD-10-CM

## 2020-01-07 DIAGNOSIS — R570 Cardiogenic shock: Secondary | ICD-10-CM

## 2020-01-07 LAB — BLOOD GAS, ARTERIAL
Acid-Base Excess: 3 mmol/L — ABNORMAL HIGH (ref 0.0–2.0)
Bicarbonate: 28.9 mmol/L — ABNORMAL HIGH (ref 20.0–28.0)
Drawn by: 57060
FIO2: 40
O2 Saturation: 98.5 %
Patient temperature: 36.5
pCO2 arterial: 59.5 mmHg — ABNORMAL HIGH (ref 32.0–48.0)
pH, Arterial: 7.304 — ABNORMAL LOW (ref 7.350–7.450)
pO2, Arterial: 127 mmHg — ABNORMAL HIGH (ref 83.0–108.0)

## 2020-01-07 LAB — COOXEMETRY PANEL
Carboxyhemoglobin: 0.9 % (ref 0.5–1.5)
Carboxyhemoglobin: 0.9 % (ref 0.5–1.5)
Methemoglobin: 0.5 % (ref 0.0–1.5)
Methemoglobin: 0.6 % (ref 0.0–1.5)
O2 Saturation: 69.4 %
O2 Saturation: 72.6 %
Total hemoglobin: 13.6 g/dL (ref 12.0–16.0)
Total hemoglobin: 13.8 g/dL (ref 12.0–16.0)

## 2020-01-07 LAB — BASIC METABOLIC PANEL
Anion gap: 15 (ref 5–15)
BUN: 74 mg/dL — ABNORMAL HIGH (ref 8–23)
CO2: 28 mmol/L (ref 22–32)
Calcium: 8.9 mg/dL (ref 8.9–10.3)
Chloride: 84 mmol/L — ABNORMAL LOW (ref 98–111)
Creatinine, Ser: 1.7 mg/dL — ABNORMAL HIGH (ref 0.61–1.24)
GFR calc Af Amer: 41 mL/min — ABNORMAL LOW (ref >60)
GFR calc non Af Amer: 35 mL/min — ABNORMAL LOW (ref >60)
Glucose, Bld: 193 mg/dL — ABNORMAL HIGH (ref 70–99)
Potassium: 4.8 mmol/L (ref 3.5–5.1)
Sodium: 127 mmol/L — ABNORMAL LOW (ref 135–145)

## 2020-01-07 LAB — LACTIC ACID, PLASMA: Lactic Acid, Venous: 1.4 mmol/L (ref 0.5–1.9)

## 2020-01-07 MED ORDER — CHLORHEXIDINE GLUCONATE CLOTH 2 % EX PADS
6.0000 | MEDICATED_PAD | Freq: Every day | CUTANEOUS | Status: DC
  Administered 2020-01-08 – 2020-01-17 (×8): 6 via TOPICAL

## 2020-01-07 MED ORDER — SODIUM CHLORIDE 0.9% FLUSH: 3.0000 mL | INTRAVENOUS | Status: DC | PRN

## 2020-01-07 MED ORDER — SODIUM CHLORIDE 0.9% FLUSH
10.0000 mL | Freq: Two times a day (BID) | INTRAVENOUS | Status: DC
  Administered 2020-01-08 – 2020-01-12 (×8): 10 mL
  Administered 2020-01-12: 20 mL
  Administered 2020-01-13: 30 mL
  Administered 2020-01-13 – 2020-01-14 (×3): 20 mL
  Administered 2020-01-15: 09:00:00 10 mL
  Administered 2020-01-15: 23:00:00 20 mL
  Administered 2020-01-16 – 2020-01-17 (×2): 10 mL

## 2020-01-07 MED ORDER — FUROSEMIDE 10 MG/ML IJ SOLN
120.0000 mg | Freq: Two times a day (BID) | INTRAVENOUS | Status: DC
  Administered 2020-01-07 – 2020-01-09 (×4): 120 mg via INTRAVENOUS
  Filled 2020-01-07: qty 12
  Filled 2020-01-07: qty 10
  Filled 2020-01-07: qty 12
  Filled 2020-01-07 (×2): qty 10

## 2020-01-07 MED ORDER — SODIUM CHLORIDE 0.9 % IV SOLN: 250.0000 mL | INTRAVENOUS | Status: DC | PRN

## 2020-01-07 MED ORDER — NOREPINEPHRINE 4 MG/250ML-% IV SOLN
0.0000 ug/min | INTRAVENOUS | Status: DC
  Filled 2020-01-07: qty 250

## 2020-01-07 MED ORDER — FUROSEMIDE 10 MG/ML IJ SOLN: 80.0000 mg | Freq: Once | INTRAMUSCULAR | Status: DC

## 2020-01-07 MED ORDER — SODIUM CHLORIDE 0.9% FLUSH
3.0000 mL | Freq: Two times a day (BID) | INTRAVENOUS | Status: DC
  Administered 2020-01-07: 3 mL via INTRAVENOUS

## 2020-01-07 MED ORDER — HEPARIN (PORCINE) 25000 UT/250ML-% IV SOLN
1100.0000 [IU]/h | INTRAVENOUS | Status: DC
  Administered 2020-01-07: 1100 [IU]/h via INTRAVENOUS
  Filled 2020-01-07 (×2): qty 250

## 2020-01-07 MED ORDER — SODIUM CHLORIDE 0.9% FLUSH: 10.0000 mL | INTRAVENOUS | Status: DC | PRN

## 2020-01-07 MED ORDER — SODIUM CHLORIDE 0.9 % IV SOLN: INTRAVENOUS | Status: DC

## 2020-01-07 MED ORDER — DOBUTAMINE IN D5W 4-5 MG/ML-% IV SOLN
1.5000 ug/kg/min | INTRAVENOUS | Status: DC
  Administered 2020-01-07: 2.5 ug/kg/min via INTRAVENOUS

## 2020-01-07 MED ORDER — ASPIRIN 81 MG PO CHEW
81.0000 mg | CHEWABLE_TABLET | ORAL | Status: AC
  Administered 2020-01-08: 81 mg via ORAL
  Filled 2020-01-07: qty 1

## 2020-01-07 NOTE — Progress Notes (Addendum)
Patient on O2 during shift change, but no O2 after working with mobility specialist; per mobility specialist no resp distress.   Per PA Furth via face to face at [phone], hold metoprolol if BP soft and give Lasix.  Lasix given; returned patient to 2L per patient request for comfort.    Per PT report face to face at 1500; right leg weakness when working with PT. Ambulated without O2; no resp distress.   1635 patient respond to commands to squeeze fingers and stated yes and ok when asked if breathing ok. Good cap refill. Extremities cool to touch.  Paged Furth at [phone]. Please call back about consent for cath procedure tomorrow. Upon callback, RN requested cardio team MD or PA discuss procedure and consent with family given patient's drowsiness, disorientation and lack of awareness of procedure. PA will come to bedside to call family and review consent.  PA Furth to bedside at 1645, informed PA concerned about overall patient's overall appearance (more ill appearing since earlier in day) and increased need for O2. PA ABG requested; called RT to complete ABG. Informed PA Furth patient ambulated with PT without O2 with no distress noted per PT with O2 in 90's, however, increased drowsiness and fatigue since ambulation.   PA Furth informed will xfer to ICU for Levophed and Lasix after discussion with MD Turner.  1700 RN informed patient of xfer, inquired if want care team to call family/whom to call. Patient requested call to daughter Precious Haws. Informed PA Furth to call Stokes at 1710.   Received call from MD Turner at 1739 Informed will xfer for Levophed and Lasix IV and Bensimhon will come to bedside to assess patient. RN informed Turner IV push Lasix not yet given.   Bensimhon arrived 1747 to assess patient; RN present. RN informed Bensimhon of assessment and change in appearance upon reassessment.   1803 reassess, drowsy but aroused to name and then more alert.   1830 RN transported to [facility]  ICU. Observed redness/bruising right lower abdomen and flank upon xfer to ICU bed; not previously observed. ICU RN informed and observed.

## 2020-01-07 NOTE — Progress Notes (Signed)
ANTICOAGULATION CONSULT NOTE - Initial Consult  Pharmacy Consult for apixaban>>heparin Indication: chest pain/ACS and atrial fibrillation  No Known Allergies  Patient Measurements: Height: 5\' 8"  (172.7 cm) Weight: 231 lb 0.7 oz (104.8 kg) IBW/kg (Calculated) : 68.4 Heparin Dosing Weight: 91kg  Vital Signs: Temp: 98.4 F (36.9 C) (01/28 0436) Temp Source: Oral (01/28 0436) BP: 98/70 (01/28 1012) Pulse Rate: 74 (01/28 1012)  Labs: Recent Labs    01/05/20 1019 01/06/20 0440 01/07/20 0418  CREATININE 1.78* 1.45* 1.70*    Estimated Creatinine Clearance: 35.3 mL/min (A) (by C-G formula based on SCr of 1.7 mg/dL (H)).   Medical History: Past Medical History:  Diagnosis Date  . Anemia   . Atrial fibrillation (HCC)   . BPH (benign prostatic hyperplasia)   . Bradycardia   . Cardiomyopathy   . CHF (congestive heart failure) (HCC)   . Diabetes mellitus type II   . GERD (gastroesophageal reflux disease)   . History of colonoscopy   . HTN (hypertension)   . Hyperlipidemia   . ICD (implantable cardiac defibrillator) in place 10/14/2012   biventricular  . OA (osteoarthritis)   . Obesity   . Peptic ulcer    Assessment: 86 year old male with permanent afib on apixaban prior to admit (received dose this am). Cardiology planning to cath to rule out cardiac event. Will start heparin tonight  Goal of Therapy:  Heparin level 0.3-0.7 units/ml aPTT 66-102 seconds Monitor platelets by anticoagulation protocol: Yes   Plan:  Start heparin infusion at 1100 units/hr>>start tonight 8pm Check anti-Xa level in 8 hours and daily while on heparin Continue to monitor H&H and platelets  98 PharmD., BCPS Clinical Pharmacist 01/07/2020 12:09 PM

## 2020-01-07 NOTE — Progress Notes (Signed)
Patient's O2 requirements have increased throughout the day. Now c/o SOB with increased O2 sats 94% on 4L O2.  SBP down in the 80's after Lasix 40mg  IV this am.  He put out 737cc today.  Bnp has not really changed since admit and worsening LE edema today.  Suspect low output failure with cardiogenic shock.  I have asked AHF to see patient today.  Will move to CCU.  Start low dose Levo and give Lasix 80mg  IV now.   I have spoken to his daughter, , who has Healthcare POA and I have informed her of her fathers decline.  He lived at home until this admission and his daughter would come over daily to fix him dinner.  He was able to bathe himself and fix and his own breakfast and lunch as well as some house cleaining.    We discussed goals of care going forward and his poor prognosis.  He did not wish to have heroic measures done.  She wishes for him to be made DNR.  We also discussed Palliative Care consult which I will place.  She asked about cardiac cath.  I explained that likely it would not affect prognosis or treatment at this time and with his declining renal function may hasten his demise if exposed to contrast.  She agreed and at this time wants to hold off on cath.    I spent 20 minutes on phone with patient's daughter.

## 2020-01-07 NOTE — Consult Note (Addendum)
Advanced Heart Failure Team Consult Note   Primary Physician: Wynn Banker, MD PCP-Cardiologist:  Olga Millers, MD  Reason for Consultation: Cardiogenic shock  HPI:    [facility] is seen today for evaluation of cardiogenic shock at the request of Dr. Mayford Knife.   86 y/o male with h/o DM2, permanent AF, HTN and chronic systolic HF due to NICM - previous EF 40-45% in 2018. Has h/o CRT-D which was previously changed to CRT-P  Admitted 12/11/19 - 12/18/19 with worsening HF. Echo showed drop in EF to ~25% with moderate MRand  at least moderate RV dysfunction. Underwent IV diuresis and d/c'd home. Carvedilol added.  GOC conversation at that time with his daughter no intubation but ok with CPR/shock. D/c weight 223.   Re-admitted on 12/24/19 with recurrent HF and volume overload. Initially seemed to improved with diuresis weight 225 -> 215 on 1/20. However over the past week weight has trended up to 231 and became nonresponsive to IV lasix.   Over last 2 days much more weak and SOB. Scheduled for R/L cath to assess filling pressures and reason for dropping EF. However, this afternoon SBP in low 90s and requiring 5L O2. No response to IV lasix. Creatinine 1.2->-> 1.7  Currently feels weak. Mild SOB. Denies CP   Review of Systems: [y] = yes, [ ]  = no   . General: Weight gain Cove.Etienne ]; Weight loss [ ] ; Anorexia [ ] ; Fatigue Cove.Etienne ]; Fever [ ] ; Chills [ ] ; Weakness Cove.Etienne ]  . Cardiac: Chest pain/pressure [ y]; Resting SOB Cove.Etienne ]; Exertional SOB Cove.Etienne ]; Orthopnea [ ] ; Pedal Edema [ y]; Palpitations [ ] ; Syncope [ ] ; Presyncope [ ] ; Paroxysmal nocturnal dyspnea[y ]  . Pulmonary: Cough [ y]; Wheezing[ ] ; Hemoptysis[ ] ; Sputum [ ] ; Snoring [ ]   . GI: Vomiting[ ] ; Dysphagia[ ] ; Melena[ ] ; Hematochezia [ ] ; Heartburn[ ] ; Abdominal pain [ ] ; Constipation [ ] ; Diarrhea [ ] ; BRBPR [ ]   . GU: Hematuria[ ] ; Dysuria [ ] ; Nocturia[ ]   . Vascular: Pain in legs with walking [ ] ; Pain in feet with lying  flat [ ] ; Non-healing sores [ ] ; Stroke [ ] ; TIA [ ] ; Slurred speech [ ] ;  . Neuro: Headaches[ ] ; Vertigo[ ] ; Seizures[ ] ; Paresthesias[ ] ;Blurred vision [ ] ; Diplopia [ ] ; Vision changes [ ]   . Ortho/Skin: Arthritis [ y]; Joint pain Cove.Etienne ]; Muscle pain [ ] ; Joint swelling [ ] ; Back Pain [ ] ; Rash [ ]   . Psych: Depression[ ] ; Anxiety[ ]   . Heme: Bleeding problems [ ] ; Clotting disorders [ ] ; Anemia [ ]   . Endocrine: Diabetes [ y]; Thyroid dysfunction[ ]   Home Medications Prior to Admission medications   Medication Sig Start Date End Date Taking? Authorizing Provider  acetaminophen (TYLENOL) 650 MG CR tablet Take 1,300 mg by mouth at bedtime as needed for pain.    Yes [provider]  allopurinol (ZYLOPRIM) 100 MG tablet TAKE 1 TABLET EVERY DAY Patient taking differently: Take 100 mg by mouth daily after breakfast.  10/27/19  Yes Koberlein, Junell C, MD  apixaban (ELIQUIS) 5 MG TABS tablet TAKE 1 TABLET(5 MG) BY MOUTH TWICE DAILY Patient taking differently: Take 5 mg by mouth 2 (two) times daily after a meal.  09/02/19  Yes Koberlein, Junell C, MD  atorvastatin (LIPITOR) 20 MG tablet TAKE 1 TABLET EVERY DAY Patient taking differently: Take 20 mg by mouth daily after supper.  08/25/19  Yes Lewayne Bunting, MD  cholecalciferol (VITAMIN D) 1000 units tablet Take 1,000 Units by mouth daily after breakfast.    Yes [provider]  Docusate Calcium (STOOL SOFTENER PO) Take 2 capsules by mouth 2 (two) times daily.    Yes [provider]  donepezil (ARICEPT) 5 MG tablet TAKE 1 TABLET  BY MOUTH AT BEDTIME. Patient taking differently: Take 5 mg by mouth at bedtime.  12/07/19  Yes Koberlein, Junell C, MD  furosemide (LASIX) 40 MG tablet Take 2 tablets (80 mg total) by mouth daily. 12/19/19  Yes Zannie Cove, MD  magnesium oxide (MAG-OX) 400 (241.3 Mg) MG tablet TAKE 1 TABLET (400 MG TOTAL) BY MOUTH 2 (TWO) TIMES DAILY. Patient taking differently: Take 400 mg by mouth See admin  instructions. Take one tablet (400 mg) by mouth twice daily - after breakfast and at 2pm 08/26/18  Yes Crenshaw, Madolyn Frieze, MD  MELATONIN ER PO Take 1 tablet by mouth at bedtime.   Yes [provider]  tamsulosin (FLOMAX) 0.4 MG CAPS capsule TAKE 1 CAPSULE TWICE DAILY Patient taking differently: Take 0.4 mg by mouth 2 (two) times daily after a meal.  11/20/19  Yes Koberlein, Paris Lore, MD    Past Medical History: Past Medical History:  Diagnosis Date  . Anemia   . Atrial fibrillation (HCC)   . BPH (benign prostatic hyperplasia)   . Bradycardia   . Cardiomyopathy   . CHF (congestive heart failure) (HCC)   . Diabetes mellitus type II   . GERD (gastroesophageal reflux disease)   . History of colonoscopy   . HTN (hypertension)   . Hyperlipidemia   . ICD (implantable cardiac defibrillator) in place 10/14/2012   biventricular  . OA (osteoarthritis)   . Obesity   . Peptic ulcer     Past Surgical History: Past Surgical History:  Procedure Laterality Date  . EP IMPLANTABLE DEVICE Left   . EP IMPLANTABLE DEVICE N/A 07/27/2016   Procedure: BIV Pacemaker downgrade;  Surgeon: Marinus Maw, MD;  Location[facility] INVASIVE CV LAB;  Service: Cardiovascular;  Laterality: N/A;  . ESOPHAGOGASTRODUODENOSCOPY  06/24/2002  . L-spine  1965  . Lumbar L4-5 & S1  02/2000  . TOTAL KNEE ARTHROPLASTY  1997   right  . TOTAL KNEE ARTHROPLASTY Left 02/15/2015   Procedure: LEFT TOTAL KNEE ARTHROPLASTY;  Surgeon: Durene Romans, MD;  Location: [facility] ORS;  Service: Orthopedics;  Laterality: Left;    Family History: Family History  Problem Relation Age of Onset  . Heart disease Father   . Heart disease Mother   . Diabetes Mother   . Hypertension Brother   . Hypertension Brother   . Cancer Neg Hx     Social History: Social History   Socioeconomic History  . Marital status: Widowed    Spouse name: Not on file  . Number of children: Not on file  . Years of education: Not on file  . Highest education  level: Not on file  Occupational History  . Occupation: Retired  Tobacco Use  . Smoking status: Never Smoker  . Smokeless tobacco: Never Used  Substance and Sexual Activity  . Alcohol use: No  . Drug use: No  . Sexual activity: Not on file  Other Topics Concern  . Not on file  Social History Narrative  . Not on file   Social Determinants of Health   Financial Resource Strain:   . Difficulty of Paying Living Expenses: Not on file  Food Insecurity:   . Worried About Radiation protection practitioner  of Food in the Last Year: Not on file  . Ran Out of Food in the Last Year: Not on file  Transportation Needs:   . Lack of Transportation (Medical): Not on file  . Lack of Transportation (Non-Medical): Not on file  Physical Activity:   . Days of Exercise per Week: Not on file  . Minutes of Exercise per Session: Not on file  Stress:   . Feeling of Stress : Not on file  Social Connections:   . Frequency of Communication with Friends and Family: Not on file  . Frequency of Social Gatherings with Friends and Family: Not on file  . Attends Religious Services: Not on file  . Active Member of Clubs or Organizations: Not on file  . Attends Banker Meetings: Not on file  . Marital Status: Not on file    Allergies:  No Known Allergies  Objective:    Vital Signs:   Temp:  [97.6 F (36.4 C)-98.7 F (37.1 C)] 97.6 F (36.4 C) (01/28 1405) Pulse Rate:  [49-74] 60 (01/28 1823) Resp:  [18-20] 18 (01/28 1405) BP: (87-105)/(70-83) 91/74 (01/28 1823) SpO2:  [85 %-100 %] 100 % (01/28 1823) Weight:  [104.8 kg] 104.8 kg (01/28 1200) Last BM Date: 01/05/20  Weight change: Filed Weights   01/06/20 0430 01/07/20 0436 01/07/20 1200  Weight: 103.5 kg 104.8 kg 104.8 kg    Intake/Output:   Intake/Output Summary (Last 24 hours) at 01/07/2020 1836 Last data filed at 01/07/2020 1518 Gross per 24 hour  Intake 670 ml  Output 787 ml  Net -117 ml      Physical Exam    General:  Elderly. weak  appearing. No resp difficulty HEENT: normal Neck: supple. JVP to ear  . Carotids 2+ bilat; no bruits. No lymphadenopathy or thyromegaly appreciated. Cor: PMI nondisplaced. Regular rate & rhythm. + s3 2/6 SEM at RUSB Lungs: + crackles  Abdomen: obese soft, nontender, nondistended. No hepatosplenomegaly. No bruits or masses. Good bowel sounds. Extremities: no cyanosis, clubbing, rash, 3+ edema Cool  + R flank ecchymmosis Neuro: alert & orientedx3, cranial nerves grossly intact. moves all 4 extremities w/o difficulty. Affect pleasant   Telemetry   AF with v-pacing in 80s. Personally reviewed   EKG    AF 77 with v-pacing and frequent PVCs. Personally reviewed   Labs   Basic Metabolic Panel: Recent Labs  Lab 01/01/20 0536 01/01/20 0536 01/02/20 0549 01/02/20 0549 01/03/20 0523 01/03/20 0523 01/05/20 1019 01/06/20 0440 01/07/20 0418  NA 132*   < > 131*  --  130*  --  128* 127* 127*  K 4.2   < > 4.3  --  4.6  --  4.7 4.7 4.8  CL 91*   < > 91*  --  93*  --  87* 89* 84*  CO2 32   < > 30  --  24  --  30 26 28   GLUCOSE 141*   < > 138*  --  171*  --  230* 177* 193*  BUN 40*   < > 44*  --  51*  --  67* 70* 74*  CREATININE 1.18   < > 1.25*  --  1.42*  --  1.78* 1.45* 1.70*  CALCIUM 8.5*   < > 8.9   < > 8.9   < > 8.8* 8.8* 8.9  MG 1.7  --   --   --   --   --   --   --   --    < > =  values in this interval not displayed.    Liver Function Tests: No results for input(s): AST, ALT, ALKPHOS, BILITOT, PROT, ALBUMIN in the last 168 hours. No results for input(s): LIPASE, AMYLASE in the last 168 hours. No results for input(s): AMMONIA in the last 168 hours.  CBC: Recent Labs  Lab 01/01/20 0536  WBC 6.7  HGB 13.1  HCT 39.4  MCV 93.4  PLT 102*    Cardiac Enzymes: No results for input(s): CKTOTAL, CKMB, CKMBINDEX, TROPONINI in the last 168 hours.  BNP: BNP (last 3 results) Recent Labs    12/30/19 0326 01/03/20 0849 01/05/20 1019  BNP 1,321.1* 1,494.6* 1,374.1*     ProBNP (last 3 results) No results for input(s): PROBNP in the last 8760 hours.   CBG: No results for input(s): GLUCAP in the last 168 hours.  Coagulation Studies: No results for input(s): LABPROT, INR in the last 72 hours.   Imaging    No results found.   Medications:     Current Medications: . allopurinol  100 mg Oral QPC breakfast  . [START ON 01/08/2020] aspirin  81 mg Oral Pre-Cath  . atorvastatin  20 mg Oral QPC supper  . docusate sodium  100 mg Oral BID  . donepezil  5 mg Oral QHS  . furosemide  80 mg Intravenous Once  . magnesium oxide  400 mg Oral Daily  . Melatonin  6 mg Oral QHS  . metoprolol succinate  37.5 mg Oral Daily  . sodium chloride flush  3 mL Intravenous Q12H  . sodium chloride flush  3 mL Intravenous Q12H  . sodium chloride flush  3 mL Intravenous Q12H  . tamsulosin  0.4 mg Oral BID PC     Infusions: . sodium chloride    . sodium chloride    . sodium chloride    . [START ON 01/08/2020] sodium chloride    . heparin    . norepinephrine (LEVOPHED) Adult infusion         Assessment/Plan   1. Cardiogenic shock - echo 12/12/19 EF 25% with moderate MR and moderate RV dysfunction - h/o NICM EF 40-45% echo 2018. Normal cors on cath 2006 - s/p CRT-P - he is markedly volume overload and evidence of low output - Both Dr. Mayford Knife and I had a long talk with his daughter Stanton Kidney. She wants trial of aggressive therapy with central line and inotropes but if failing does not want further aggressive measures. We addressed Code Status directly and now DNR/DNI. Bipap ok.  - Move to ICU. Place central line - stop b-blocker. Start dobutamine  2. Acute on chronic systolic HF - plan as above  3. Permanent AF - s/p CRT-P - On Eliquis at home - Heprain now  4. AKI - due to shock - hemodynamic support  5. DM2 - cover with SSI   CRITICAL CARE Performed by: Arvilla Meres  Total critical care time: 45 minutes  Critical care time was exclusive  of separately billable procedures and treating other patients.  Critical care was necessary to treat or prevent imminent or life-threatening deterioration.  Critical care was time spent personally by me (independent of midlevel providers or residents) on the following activities: development of treatment plan with patient and/or surrogate as well as nursing, discussions with consultants, evaluation of patient's response to treatment, examination of patient, obtaining history from patient or surrogate, ordering and performing treatments and interventions, ordering and review of laboratory studies, ordering and review of radiographic studies, pulse oximetry and re-evaluation  of patient's condition.    Length of Stay: [place], MD  01/07/2020, 6:36 PM  Advanced Heart Failure Team Pager [phone] (M-F; [place])  Please contact [facility] for night-coverage after hours (4p -7a ) and weekends on [email or web address]

## 2020-01-07 NOTE — Progress Notes (Signed)
I was paged by the nurse regarding the consent for cardiac catheterization. She said the patient did not appear as awake and alert as this morning and did not feel he could sign his own consent. He did have PT and this might have tired him out. She said his breathing was more labored and requiring more O2. Upon my arrival patient was requiring 4L O2. B/P 95/70, HR 72. He was awake and alert. Had crackles on exam with labored breathing. No chest pain. He did receive AM lasix but had not yet received the second dose (2/2 hypotension). Spoke with Dr Mayford Knife who recommended we transfer him to the ICU for levophed drip and IV lasix 80 mg. Advanced Heart Failure to see the patient. Might need to hold on [facility]. Will re-evaluate in the AM.   Vianca Bracher Fransico Michael, PA-C

## 2020-01-07 NOTE — Progress Notes (Signed)
Progress Note  Patient Name: [facility] Date of Encounter: 01/07/2020  Primary Cardiologist: Olga Millers, MD   Subjective   Patient states his breathing is better today. No CP. Still has compression wraps on his legs.   Inpatient Medications    Scheduled Meds: . allopurinol  100 mg Oral QPC breakfast  . apixaban  5 mg Oral BID PC  . atorvastatin  20 mg Oral QPC supper  . docusate sodium  100 mg Oral BID  . donepezil  5 mg Oral QHS  . furosemide  40 mg Intravenous Q12H  . magnesium oxide  400 mg Oral Daily  . Melatonin  6 mg Oral QHS  . metoprolol succinate  37.5 mg Oral Daily  . sodium chloride flush  3 mL Intravenous Q12H  . sodium chloride flush  3 mL Intravenous Q12H  . tamsulosin  0.4 mg Oral BID PC   Continuous Infusions: . sodium chloride    . sodium chloride     PRN Meds: sodium chloride, sodium chloride, acetaminophen, ipratropium-albuterol, ondansetron (ZOFRAN) IV, sodium chloride flush, sodium chloride flush, zolpidem   Vital Signs    Vitals:   01/06/20 0847 01/06/20 1110 01/06/20 1939 01/07/20 0436  BP: (!) 151/124 118/76 105/78 92/79  Pulse: 90 76 (!) 49 70  Resp:  18 20 18   Temp:  97.8 F (36.6 C) 98.7 F (37.1 C) 98.4 F (36.9 C)  TempSrc:    Oral  SpO2:  100% 91% 99%  Weight:    104.8 kg  Height:        Intake/Output Summary (Last 24 hours) at 01/07/2020 0936 Last data filed at 01/07/2020 0437 Gross per 24 hour  Intake 700 ml  Output 737 ml  Net -37 ml   Last 3 Weights 01/07/2020 01/06/2020 01/05/2020  Weight (lbs) 231 lb 0.7 oz 228 lb 2.8 oz 227 lb 4.7 oz  Weight (kg) 104.8 kg 103.5 kg 103.1 kg      Telemetry    Afib with V-paced and intermittently A-paced rhythm - Personally Reviewed  ECG    No new - Personally Reviewed  Physical Exam   GEN: No acute distress.   Neck: No JVD Cardiac: RRR, no murmurs, rubs, or gallops.  Respiratory: Clear to auscultation bilaterally. GI: Soft, nontender, non-distended  MS:  1+ edema; No deformity. Neuro:  Nonfocal  Psych: Normal affect   Labs    High Sensitivity Troponin:   Recent Labs  Lab 12/11/19 1338 12/11/19 1638  TROPONINIHS 41* 42*      Chemistry Recent Labs  Lab 01/05/20 1019 01/06/20 0440 01/07/20 0418  NA 128* 127* 127*  K 4.7 4.7 4.8  CL 87* 89* 84*  CO2 30 26 28   GLUCOSE 230* 177* 193*  BUN 67* 70* 74*  CREATININE 1.78* 1.45* 1.70*  CALCIUM 8.8* 8.8* 8.9  GFRNONAA 33* 43* 35*  GFRAA 39* 49* 41*  ANIONGAP 11 12 15      Hematology Recent Labs  Lab 01/01/20 0536  WBC 6.7  RBC 4.22  HGB 13.1  HCT 39.4  MCV 93.4  MCH 31.0  MCHC 33.2  RDW 14.3  PLT 102*    BNP Recent Labs  Lab 01/03/20 0849 01/05/20 1019  BNP 1,494.6* 1,374.1*     DDimer No results for input(s): DDIMER in the last 168 hours.   Radiology    DG Chest 2 View  Result Date: 01/06/2020 CLINICAL DATA:  Congestive heart failure. EXAM: CHEST - 2 VIEW COMPARISON:  01/03/2020. FINDINGS: Trachea  is midline. Heart is enlarged, stable. Thoracic aorta is calcified. Pacemaker and ICD lead tips are stable in position. Mixed interstitial and airspace opacification, basilar predominant, with small to moderate bilateral pleural effusions, similar to 01/03/2020. Advanced degenerative changes in the left shoulder.  Similar. IMPRESSION: 1.  Aortic atherosclerosis (ICD10-I70.0). 2. Electronically Signed   By: Lorin Picket M.D.   On: 01/06/2020 10:30    Cardiac Studies   Echo 12/12/2019  1. Left ventricular ejection fraction, by visual estimation, is 30 to 35%. The left ventricle has moderate to severely decreased function. There is no left ventricular hypertrophy. 2. Definity contrast agent was given IV to delineate the left ventricular endocardial borders. 3. Left ventricular diastolic function could not be evaluated. 4. The left ventricle demonstrates global hypokinesis. 5. Global right ventricle has normal systolic function.The right ventricular size is  normal. No increase in right ventricular wall thickness. 6. Left atrial size was severely dilated. 7. Right atrial size was severely dilated. 8. The mitral valve is normal in structure. Mild to moderate mitral valve regurgitation. No evidence of mitral stenosis. 9. The tricuspid valve is normal in structure. 10. The aortic valve is normal in structure. Aortic valve regurgitation is not visualized. Mild to moderate aortic valve sclerosis/calcification without any evidence of aortic stenosis. 11. The pulmonic valve was normal in structure. Pulmonic valve regurgitation is trivial. 12. Aortic dilatation noted. 13. There is mild dilatation of the ascending aorta measuring 42 mm. 14. Compared with the echo 02/2018, the ascending aorta has increased from 3.8 cm to 4.2 cm. 15. Moderately elevated pulmonary artery systolic pressure. 16. A pacer wire is visualized. 17. The inferior vena cava is normal in size with greater than 50% respiratory variability, suggesting right atrial pressure of 3 mmHg  Patient Profile     86 y.o. male  with chronic systolic and diastolic heart failure (LVEF 30 to 35%), status post BiV pacemaker, permanent atrial fibrillation on Eliquis, hypertension, hyperlipidemia, and diabetes admitted with acute on chronic systolic and diastolic heart failure.  Assessment & Plan   Acute on chronic systolic and diastolic heart failure: -secondary to nonischemic DCM -s/p biV PPM.   -Lasix increased to 80 mg twice daily but appears to in advertantly fell off his MAR when Dr. Oval Linsey increased to 80mg  BID on 1/24 so was off diuretics for 2 days -weight was up and he had crackles on exam. BNP 1,374. CXR showed small to moderate B/L pleural effusions, similar to 01/03/20 - He was restarted on IV lasix 40 mg BID -renal function 1.78>1.45 > 1.70 -he put out 766mL yesterday and is net neg 8.2 L since admit. Weight is up again today - Patient likely needs further diuresis given elevated  BNP and unchanged CXR. Lungs sound clear but he still has some edema on his lower extremities. Will discuss lasix dose with MD. Pressures soft this AM>> Hold BB  Permanent atrial fibrillation:  -heart rate controlled -continue Toprol 37.5mg  daily and Apixaban 5mg  BID  Hypertension: -DBP has been elevated in the 90's -BP low this AM>>Hold BB this AM  Hyperlipidmia:  --Continue atorvastatin.  Acute on chronic renal failure: -creatinine was trending upward  but felt to be related to worsening volume overload -creatinine initially improving, 1.45 > 1.70 -Monitor renal function closely    For questions or updates, please contact [facility] Please consult [email or web address] for contact info under        Signed, Meir Elwood Ninfa Meeker, PA-C  01/07/2020, 9:36 AM

## 2020-01-07 NOTE — Progress Notes (Signed)
Granddaughter called to get updates on pt, she was updated on pt status and educated on videoconferencing calls.

## 2020-01-07 NOTE — Progress Notes (Signed)
Physical Therapy Treatment Patient Details Name: [name] MRN: [id number] DOB: [birth year] Today's Date: 01/07/2020    History of Present Illness Patient is a 86 y/o male who presents with Acute on chronic systolic CHF/nonischemic cardiomyopathy. PMh includes systolic dysfunction CHF, nonischemic cardiomyopathy, pacemaker placement, HTN, DM, HLD, frequent PVCs.    PT Comments    Patient seen for mobility progression. Pt appears more fatigued today vs last couple of sessions but agreeable to participate. Pt tolerated ~half the gait distance of session on 1/26 and with R knee buckling requiring assistance to prevent fall. SpO2 maintained 92% or > on RA. Continue to progress as tolerated.     Follow Up Recommendations  Home health PT;Supervision for mobility/OOB     Equipment Recommendations  None recommended by PT    Recommendations for Other Services       Precautions / Restrictions Precautions Precautions: Fall    Mobility  Bed Mobility               General bed mobility comments: pt OOB in chair upon arrival  Transfers Overall transfer level: Needs assistance Equipment used: 4-wheeled walker(rollator) Transfers: Sit to/from [employer] Sit to Stand: Min assist Stand pivot transfers: Mod assist       General transfer comment: min A to power up all the way into standing from recliner; pt took seated break on rollator then required mod A for stand pivot from there to recliner   Ambulation/Gait Ambulation/Gait assistance: Min assist;Mod assist Gait Distance (Feet): 100 Feet Assistive device: 4-wheeled walker Gait Pattern/deviations: Decreased stride length;Wide base of support;Step-through pattern;Trunk flexed;Drifts right/left Gait velocity: decreased   General Gait Details: cues for upright posture and closer proximity to AD; pt more steady compared to previous session and with R knee buckling requiring assistance to prevent fall;  pt rested on rollator seat while recliner brought into hallway for return to room   Stairs             Wheelchair Mobility    Modified Rankin (Stroke Patients Only)       Balance Overall balance assessment: Needs assistance Sitting-balance support: Feet supported;No upper extremity supported Sitting balance-Leahy Scale: Fair     Standing balance support: During functional activity Standing balance-Leahy Scale: Poor                              Cognition Arousal/Alertness: Awake/alert Behavior During Therapy: WFL for tasks assessed/performed Overall Cognitive Status: Within Functional Limits for tasks assessed Area of Impairment: Safety/judgement                         Safety/Judgement: Decreased awareness of safety;Decreased awareness of deficits     General Comments: Pt HOH      Exercises      General Comments General comments (skin integrity, edema, etc.): SpO2 92% or > on RA      Pertinent Vitals/Pain Pain Assessment: No/denies pain    Home Living                      Prior Function            PT Goals (current goals can now be found in the care plan section) Progress towards PT goals: Progressing toward goals    Frequency    Min 3X/week      PT Plan Current plan remains appropriate    Co-evaluation  AM-PAC PT "6 Clicks" Mobility   Outcome Measure  Help needed turning from your back to your side while in a flat bed without using bedrails?: A Little Help needed moving from lying on your back to sitting on the side of a flat bed without using bedrails?: A Little Help needed moving to and from a bed to a chair (including a wheelchair)?: A Little Help needed standing up from a chair using your arms (e.g., wheelchair or bedside chair)?: A Little Help needed to walk in hospital room?: A Lot Help needed climbing 3-5 steps with a railing? : A Lot 6 Click Score: 16    End of Session  Equipment Utilized During Treatment: Gait belt Activity Tolerance: Patient limited by fatigue Patient left: with call bell/phone within reach;in chair Nurse Communication: Mobility status PT Visit Diagnosis: Unsteadiness on feet (R26.81);Difficulty in walking, not elsewhere classified (R26.2)     Time: 6222-9798 PT Time Calculation (min) (ACUTE ONLY): 28 min  Charges:  $Gait Training: 8-22 mins $Therapeutic Activity: 8-22 mins                     Earney Navy, PTA Acute Rehabilitation Services Pager: [phone] Office: [phone]     Darliss Cheney 01/07/2020, 3:38 PM

## 2020-01-07 NOTE — CV Procedure (Addendum)
Central Venous Catheter Insertion Procedure Note [facility] [id number] 08-05-1929    Procedure: Insertion of Central Venous Catheter Indications: Drug and/or fluid administration   Procedure Details Consent: Risks of procedure as well as the alternatives and risks of each were explained to the (patient/caregiver).  Consent for procedure obtained. Time Out: Verified patient identification, verified procedure, site/side was marked, verified correct patient position, special equipment/implants available, medications/allergies/relevent history reviewed, required imaging and test results available.  Performed   Maximum sterile technique was used including antiseptics, cap, gloves, gown, hand hygiene, mask and sheet. Skin prep: Chlorhexidine; local anesthetic administered A antimicrobial bonded/coated triple lumen catheter was placed in the right internal jugular vein using the Seldinger technique and u/s guidance.   Evaluation Blood flow good Complications: No apparent complications Patient did tolerate procedure well. Chest X-ray ordered to verify placement.  CXR: pending   Arvilla Meres, MD  7:30 PM

## 2020-01-07 NOTE — Plan of Care (Signed)
  Problem: Education: Goal: Ability to demonstrate management of disease process will improve Outcome: Progressing   Problem: Education: Goal: Ability to verbalize understanding of medication therapies will improve Outcome: Progressing   Problem: Skin Integrity: Goal: Risk for impaired skin integrity will decrease Outcome: Progressing

## 2020-01-08 ENCOUNTER — Encounter (HOSPITAL_COMMUNITY)
Admission: EM | Disposition: A | Discharge status: Hospice | Payer: Self-pay | Source: Home / Self Care | Attending: Cardiology

## 2020-01-08 LAB — APTT
aPTT: 129 seconds — ABNORMAL HIGH (ref 24–36)
aPTT: 180 seconds (ref 24–36)
aPTT: 50 seconds — ABNORMAL HIGH (ref 24–36)
aPTT: 80 seconds — ABNORMAL HIGH (ref 24–36)

## 2020-01-08 LAB — BASIC METABOLIC PANEL
Anion gap: 10 (ref 5–15)
BUN: 66 mg/dL — ABNORMAL HIGH (ref 8–23)
CO2: 32 mmol/L (ref 22–32)
Calcium: 8.5 mg/dL — ABNORMAL LOW (ref 8.9–10.3)
Chloride: 86 mmol/L — ABNORMAL LOW (ref 98–111)
Creatinine, Ser: 1.25 mg/dL — ABNORMAL HIGH (ref 0.61–1.24)
GFR calc Af Amer: 59 mL/min — ABNORMAL LOW (ref >60)
GFR calc non Af Amer: 51 mL/min — ABNORMAL LOW (ref >60)
Glucose, Bld: 155 mg/dL — ABNORMAL HIGH (ref 70–99)
Potassium: 3.7 mmol/L (ref 3.5–5.1)
Sodium: 128 mmol/L — ABNORMAL LOW (ref 135–145)

## 2020-01-08 LAB — COOXEMETRY PANEL
Carboxyhemoglobin: 1.1 % (ref 0.5–1.5)
Methemoglobin: 0.7 % (ref 0.0–1.5)
O2 Saturation: 72.6 %
Total hemoglobin: 13.4 g/dL (ref 12.0–16.0)

## 2020-01-08 LAB — HEPARIN LEVEL (UNFRACTIONATED): Heparin Unfractionated: >2.2 IU/mL — ABNORMAL HIGH (ref 0.30–0.70)

## 2020-01-08 LAB — CBC
HCT: 39 % (ref 39.0–52.0)
Hemoglobin: 13.3 g/dL (ref 13.0–17.0)
MCH: 31.4 pg (ref 26.0–34.0)
MCHC: 34.1 g/dL (ref 30.0–36.0)
MCV: 92.2 fL (ref 80.0–100.0)
Platelets: 69 10*3/uL — ABNORMAL LOW (ref 150–400)
RBC: 4.23 MIL/uL (ref 4.22–5.81)
RDW: 13.6 % (ref 11.5–15.5)
WBC: 6.3 10*3/uL (ref 4.0–10.5)
nRBC: 0.3 % — ABNORMAL HIGH (ref 0.0–0.2)

## 2020-01-08 LAB — GLUCOSE, CAPILLARY: Glucose-Capillary: 142 mg/dL — ABNORMAL HIGH (ref 70–99)

## 2020-01-08 LAB — MRSA PCR SCREENING: MRSA by PCR: NEGATIVE

## 2020-01-08 SURGERY — RIGHT/LEFT HEART CATH AND CORONARY ANGIOGRAPHY
Anesthesia: LOCAL

## 2020-01-08 MED ORDER — DIGOXIN 125 MCG PO TABS
0.1250 mg | ORAL_TABLET | Freq: Every day | ORAL | Status: DC
  Administered 2020-01-08 – 2020-01-13 (×6): 0.125 mg via ORAL
  Filled 2020-01-08 (×6): qty 1

## 2020-01-08 MED ORDER — POTASSIUM CHLORIDE CRYS ER 20 MEQ PO TBCR
40.0000 meq | EXTENDED_RELEASE_TABLET | Freq: Once | ORAL | Status: AC
  Administered 2020-01-08: 40 meq via ORAL
  Filled 2020-01-08: qty 2

## 2020-01-08 MED ORDER — SPIRONOLACTONE 12.5 MG HALF TABLET
12.5000 mg | ORAL_TABLET | Freq: Every day | ORAL | Status: DC
  Administered 2020-01-08 – 2020-01-13 (×6): 12.5 mg via ORAL
  Filled 2020-01-08 (×6): qty 1

## 2020-01-08 MED ORDER — HEPARIN (PORCINE) 25000 UT/250ML-% IV SOLN
900.0000 [IU]/h | INTRAVENOUS | Status: DC
  Administered 2020-01-08 – 2020-01-09 (×3): 1000 [IU]/h via INTRAVENOUS
  Administered 2020-01-11: 900 [IU]/h via INTRAVENOUS
  Administered 2020-01-11: 850 [IU]/h via INTRAVENOUS
  Filled 2020-01-08 (×3): qty 250

## 2020-01-08 NOTE — Progress Notes (Signed)
ANTICOAGULATION CONSULT NOTE - Follow-Up Consult  Pharmacy Consult for apixaban>>heparin Indication: chest pain/ACS and atrial fibrillation  No Known Allergies  Patient Measurements: Height: 5\' 8"  (172.7 cm) Weight: 230 lb 13.2 oz (104.7 kg) IBW/kg (Calculated) : 68.4 Heparin Dosing Weight: 91kg  Vital Signs: Temp: 98 F (36.7 C) (01/29 1924) Temp Source: Axillary (01/29 1924) BP: 97/58 (01/29 1900) Pulse Rate: 73 (01/29 1900)  Labs: Recent Labs    01/06/20 0440 01/07/20 0418 01/08/20 0333 01/08/20 0333 01/08/20 0532 01/08/20 1033 01/08/20 1818  HGB  --   --  13.3  --   --   --   --   HCT  --   --  39.0  --   --   --   --   PLT  --   --  69*  --   --   --   --   APTT  --   --  129*   < > 180* 50* 80*  HEPARINUNFRC  --   --  >2.20*  --   --   --   --   CREATININE 1.45* 1.70* 1.25*  --   --   --   --    < > = values in this interval not displayed.    Estimated Creatinine Clearance: 47.9 mL/min (A) (by C-G formula based on SCr of 1.25 mg/dL (H)).  Assessment: 86 year old male with permanent afib on apixaban prior to admit started on IV heparin in anticipation of cardiac cath.   aptt now within goal at 80  Goal of Therapy:  Heparin level 0.3-0.7 units/ml aPTT 66-102 seconds Monitor platelets by anticoagulation protocol: Yes   Plan:  -heparin 1000 units/h -daily lvls  98, PharmD, BCPS, BCCCP Clinical Pharmacist [phone]  Please check AMION for all [facility] Pharmacy numbers  01/08/2020 7:25 PM

## 2020-01-08 NOTE — Progress Notes (Addendum)
Advanced Heart Failure Rounding Note  PCP-Cardiologist: Kirk Ruths, MD   Subjective:   Wilburn Mylar moved to ICU for cardiogenic shock. Started on dobutamine 2.5 mcg.  CO-OX 73%.   Diuresed with a total of 160 mg IV lasix. Negative 1.1 liters. Weight down 1 pound.   Creatinine trending down 1.7>1.2   Feeling a little better today. Denies SOB, orthopnea or PND. More alert.    Objective:   Weight Range: 104.7 kg Body mass index is 35.1 kg/m.   Vital Signs:   Temp:  [97.4 F (36.3 C)-98.6 F (37 C)] 97.4 F (36.3 C) (01/29 0400) Pulse Rate:  [30-77] 70 (01/29 0600) Resp:  [11-30] 13 (01/29 0600) BP: (87-129)/(56-105) 100/56 (01/29 0600) SpO2:  [85 %-100 %] 100 % (01/29 0600) Weight:  [104.7 kg-104.8 kg] 104.7 kg (01/29 0323) Last BM Date: 01/03/20  Weight change: Filed Weights   01/07/20 0436 01/07/20 1200 01/08/20 0323  Weight: 104.8 kg 104.8 kg 104.7 kg    Intake/Output:   Intake/Output Summary (Last 24 hours) at 01/08/2020 0852 Last data filed at 01/08/2020 0600 Gross per 24 hour  Intake 921.23 ml  Output 2050 ml  Net -1128.77 ml      Physical Exam   CVP 10-11 General:  Elderly. No resp difficulty HEENT: Normal anicteric.  Neck: Supple. JVP 10-11 . Carotids 2+ bilat; no bruits. No lymphadenopathy or thyromegaly appreciated. RIJ TLC Cor: PMI nondisplaced. Regular rate & rhythm. No rubs, gallops or murmurs. Lungs: Decreased on 5 liters [place].  Abdomen: Soft, nontender, nondistended. No hepatosplenomegaly. No bruits or masses. Good bowel sounds. Extremities: No cyanosis, clubbing, rash, R and LLE compression wraps. 2+edema above wraps.  Neuro: alert & oriented x 3, cranial nerves grossly intact. moves all 4 extremities w/o difficulty. Affect pleasant HOH   Telemetry   AF with V pacing 70s bpm Personally reviewed   EKG    N/a   Labs    CBC Recent Labs    01/08/20 0333  WBC 6.3  HGB 13.3  HCT 39.0  MCV 92.2  PLT 69*   Basic Metabolic  Panel Recent Labs    01/07/20 0418 01/08/20 0333  NA 127* 128*  K 4.8 3.7  CL 84* 86*  CO2 28 32  GLUCOSE 193* 155*  BUN 74* 66*  CREATININE 1.70* 1.25*  CALCIUM 8.9 8.5*   Liver Function Tests No results for input(s): AST, ALT, ALKPHOS, BILITOT, PROT, ALBUMIN in the last 72 hours. No results for input(s): LIPASE, AMYLASE in the last 72 hours. Cardiac Enzymes No results for input(s): CKTOTAL, CKMB, CKMBINDEX, TROPONINI in the last 72 hours.  BNP: BNP (last 3 results) Recent Labs    12/30/19 0326 01/03/20 0849 01/05/20 1019  BNP 1,321.1* 1,494.6* 1,374.1*    ProBNP (last 3 results) No results for input(s): PROBNP in the last 8760 hours.   D-Dimer No results for input(s): DDIMER in the last 72 hours. Hemoglobin A1C No results for input(s): HGBA1C in the last 72 hours. Fasting Lipid Panel No results for input(s): CHOL, HDL, LDLCALC, TRIG, CHOLHDL, LDLDIRECT in the last 72 hours. Thyroid Function Tests No results for input(s): TSH, T4TOTAL, T3FREE, THYROIDAB in the last 72 hours.  Invalid input(s): FREET3  Other results:   Imaging    DG CHEST PORT 1 VIEW  Result Date: 01/07/2020 CLINICAL DATA:  Central line placement EXAM: PORTABLE CHEST 1 VIEW COMPARISON:  01/06/2020 FINDINGS: Right central line is in place with the tip in the SVC. No pneumothorax. Cardiomegaly. Bilateral  airspace opacities and layering effusions. Findings most compatible with CHF. No significant change since prior study. Left pacer remains in place, unchanged. IMPRESSION: Right central line tip in the SVC.  No pneumothorax. CHF.  Layering bilateral effusions. Electronically Signed   By: Charlett Nose M.D.   On: 01/07/2020 19:57      Medications:     Scheduled Medications: . allopurinol  100 mg Oral QPC breakfast  . atorvastatin  20 mg Oral QPC supper  . Chlorhexidine Gluconate Cloth  6 each Topical Q0600  . docusate sodium  100 mg Oral BID  . donepezil  5 mg Oral QHS  . furosemide  80  mg Intravenous Once  . magnesium oxide  400 mg Oral Daily  . Melatonin  6 mg Oral QHS  . sodium chloride flush  10-40 mL Intracatheter Q12H  . tamsulosin  0.4 mg Oral BID PC     Infusions: . DOBUTamine 2.5 mcg/kg/min (01/08/20 0600)  . furosemide 120 mg (01/08/20 0750)  . heparin 1,100 Units/hr (01/08/20 0600)  . norepinephrine (LEVOPHED) Adult infusion       PRN Medications:  acetaminophen, ipratropium-albuterol, ondansetron (ZOFRAN) IV, sodium chloride flush, zolpidem    Assessment/Plan    1. Cardiogenic shock - echo 12/12/19 EF 25% with moderate MR and moderate RV dysfunction. EF was down 40-45%.  - Normal cors on cath 2006 - s/p CRT-P -CO-OX stable 73%.  - Continue dobutamine 2.5 mcg. - Volume status improving. Continue 120 mg IV lasix twice daily.  - Renal function improving.   2. Acute on chronic systolic HF - plan as above  3. Permanent AF - s/p CRT-P - On Eliquis at home - Heprain now  4. AKI - due to shock - Creatinine trending down 1.7>1.25  - hemodynamic support - Follow BMET daily   5. DM2 - cover with SSI  6. Hyponatremia  Sodium 128   7. DNR/DNI    Length of Stay: 15  Amy Clegg, NP  01/08/2020, 8:52 AM  Advanced Heart Failure Team Pager [phone] (M-F; 7a - 4p)  Please contact [facility] for night-coverage after hours (4p -7a ) and weekends on [email or web address]  Patient seen and examined with the above-signed Advanced Practice Provider and/or Housestaff. I personally reviewed laboratory data, imaging studies and relevant notes. I independently examined the patient and formulated the important aspects of the plan. I have edited the note to reflect any of my changes or salient points. I have personally discussed the plan with the patient and/or family.  Co-ox is much better than I expected. He is improved on dobutamine and with IV diuresis. Still volume overloaded. Weight up at least 15 pounds from baseline. Would continue dobutamine and  IV diuresis.   Can move to 2C. Can re-consider cardiac cath depending on clinical course. If more alert and able to progress then will proceed. If appearing more end-stage then will focus on medical therapy.  Continue heparin (no bleeding). Watch CVP. Co-ox and renal function. Add digoxin and spiro.  Arvilla Meres, MD  12:29 PM

## 2020-01-08 NOTE — Plan of Care (Signed)
Pt is alert and oriented but does have intermittent confusion at times. Mittens were placed on pt due to pulling at lines, linen was changed a few times due to condom cath getting pulled. Pt's lungs sound clear compared to the coarse crackles he had earlier in the shift. Pt has ecchymosis on his arms and right hip. Some redness noted on his sacrum. Pt's dobutamine and heparin are running, all lines are patent. Blood pressure has improved throughout the night and pt's skin is not as pale. Call bell is within reach, bed alarm is on, and bed is in lowest position. Problem: Cardiac: Goal: Ability to achieve and maintain adequate cardiopulmonary perfusion will improve Outcome: Progressing   Problem: Clinical Measurements: Goal: Respiratory complications will improve Outcome: Progressing Goal: Cardiovascular complication will be avoided Outcome: Progressing   Problem: Safety: Goal: Ability to remain free from injury will improve Outcome: Progressing

## 2020-01-08 NOTE — Progress Notes (Signed)
ANTICOAGULATION CONSULT NOTE - Follow-Up Consult  Pharmacy Consult for apixaban>>heparin Indication: chest pain/ACS and atrial fibrillation  No Known Allergies  Patient Measurements: Height: 5\' 8"  (172.7 cm) Weight: 230 lb 13.2 oz (104.7 kg) IBW/kg (Calculated) : 68.4 Heparin Dosing Weight: 91kg  Vital Signs: Temp: 97.4 F (36.3 C) (01/29 0400) Temp Source: Axillary (01/29 0400) BP: 108/67 (01/29 1130) Pulse Rate: 68 (01/29 1130)  Labs: Recent Labs    01/06/20 0440 01/07/20 0418 01/08/20 0333 01/08/20 0532 01/08/20 1033  HGB  --   --  13.3  --   --   HCT  --   --  39.0  --   --   PLT  --   --  69*  --   --   APTT  --   --  129* 180* 50*  HEPARINUNFRC  --   --  >2.20*  --   --   CREATININE 1.45* 1.70* 1.25*  --   --     Estimated Creatinine Clearance: 47.9 mL/min (A) (by C-G formula based on SCr of 1.25 mg/dL (H)).   Medical History: Past Medical History:  Diagnosis Date  . Anemia   . Atrial fibrillation (HCC)   . BPH (benign prostatic hyperplasia)   . Bradycardia   . Cardiomyopathy   . CHF (congestive heart failure) (HCC)   . Diabetes mellitus type II   . GERD (gastroesophageal reflux disease)   . History of colonoscopy   . HTN (hypertension)   . Hyperlipidemia   . ICD (implantable cardiac defibrillator) in place 10/14/2012   biventricular  . OA (osteoarthritis)   . Obesity   . Peptic ulcer    Assessment: 86 year old male with permanent afib on apixaban prior to admit started on IV heparin in anticipation of cardiac cath.   APTT elevated this am but drawn where heparin infusing, heparin subsequently paused for ~3 hr by RN and then redrawn with repeat aPTT of ~50. Difficult to assess this aPTT value with heparin being off for a few hours, but ongoing slightly elevated aPTT means we likely need a slight dose reduction. H/H relatively stable, pltc low but appears to be somewhat of a chronic issue.  Goal of Therapy:  Heparin level 0.3-0.7 units/ml aPTT  66-102 seconds Monitor platelets by anticoagulation protocol: Yes   Plan:  -Restart heparin 1000 units/h -Recheck aPTT in 8hr -Watch H/H and pltc closely   98, PharmD, BCPS Clinical Pharmacist [phone] Please check AMION for all [facility] Pharmacy numbers 01/08/2020

## 2020-01-09 LAB — BASIC METABOLIC PANEL
Anion gap: 10 (ref 5–15)
BUN: 49 mg/dL — ABNORMAL HIGH (ref 8–23)
CO2: 34 mmol/L — ABNORMAL HIGH (ref 22–32)
Calcium: 8.3 mg/dL — ABNORMAL LOW (ref 8.9–10.3)
Chloride: 88 mmol/L — ABNORMAL LOW (ref 98–111)
Creatinine, Ser: 1.05 mg/dL (ref 0.61–1.24)
GFR calc Af Amer: >60 mL/min (ref >60)
GFR calc non Af Amer: >60 mL/min (ref >60)
Glucose, Bld: 142 mg/dL — ABNORMAL HIGH (ref 70–99)
Potassium: 3.6 mmol/L (ref 3.5–5.1)
Sodium: 132 mmol/L — ABNORMAL LOW (ref 135–145)

## 2020-01-09 LAB — CBC
HCT: 38.2 % — ABNORMAL LOW (ref 39.0–52.0)
Hemoglobin: 12.5 g/dL — ABNORMAL LOW (ref 13.0–17.0)
MCH: 30.5 pg (ref 26.0–34.0)
MCHC: 32.7 g/dL (ref 30.0–36.0)
MCV: 93.2 fL (ref 80.0–100.0)
Platelets: 74 10*3/uL — ABNORMAL LOW (ref 150–400)
RBC: 4.1 MIL/uL — ABNORMAL LOW (ref 4.22–5.81)
RDW: 13.7 % (ref 11.5–15.5)
WBC: 6.2 10*3/uL (ref 4.0–10.5)
nRBC: 0 % (ref 0.0–0.2)

## 2020-01-09 LAB — COOXEMETRY PANEL
Carboxyhemoglobin: 1.8 % — ABNORMAL HIGH (ref 0.5–1.5)
Methemoglobin: 0.8 % (ref 0.0–1.5)
O2 Saturation: 74.2 %
Total hemoglobin: 13 g/dL (ref 12.0–16.0)

## 2020-01-09 LAB — APTT: aPTT: 89 seconds — ABNORMAL HIGH (ref 24–36)

## 2020-01-09 MED ORDER — LOSARTAN POTASSIUM 25 MG PO TABS
12.5000 mg | ORAL_TABLET | Freq: Every day | ORAL | Status: DC
  Administered 2020-01-09 – 2020-01-11 (×3): 12.5 mg via ORAL
  Filled 2020-01-09 (×4): qty 1

## 2020-01-09 MED ORDER — TORSEMIDE 20 MG PO TABS
40.0000 mg | ORAL_TABLET | Freq: Two times a day (BID) | ORAL | Status: DC
  Administered 2020-01-09 – 2020-01-10 (×2): 40 mg via ORAL
  Filled 2020-01-09 (×2): qty 2

## 2020-01-09 MED ORDER — POTASSIUM CHLORIDE CRYS ER 20 MEQ PO TBCR
40.0000 meq | EXTENDED_RELEASE_TABLET | Freq: Once | ORAL | Status: AC
  Administered 2020-01-09: 40 meq via ORAL
  Filled 2020-01-09: qty 2

## 2020-01-09 NOTE — Progress Notes (Signed)
ANTICOAGULATION CONSULT NOTE - Follow-Up Consult  Pharmacy Consult for apixaban>>heparin Indication: chest pain/ACS and atrial fibrillation  No Known Allergies  Patient Measurements: Height: 5\' 8"  (172.7 cm) Weight: 225 lb 5 oz (102.2 kg) IBW/kg (Calculated) : 68.4 Heparin Dosing Weight: 91kg  Vital Signs: Temp: 97.6 F (36.4 C) (01/30 0718) Temp Source: Oral (01/30 0718) BP: 94/61 (01/30 0700) Pulse Rate: 101 (01/30 0700)  Labs: Recent Labs    01/07/20 0418 01/08/20 0333 01/08/20 0532 01/08/20 1033 01/08/20 1818 01/09/20 0406  HGB  --  13.3  --   --   --  12.5*  HCT  --  39.0  --   --   --  38.2*  PLT  --  69*  --   --   --  74*  APTT  --  129*   < > 50* 80* 89*  HEPARINUNFRC  --  >2.20*  --   --   --   --   CREATININE 1.70* 1.25*  --   --   --  1.05   < > = values in this interval not displayed.    Estimated Creatinine Clearance: 56.3 mL/min (by C-G formula based on SCr of 1.05 mg/dL).  Assessment: 86 year old male with permanent afib on apixaban prior to admit started on IV heparin in anticipation of cardiac cath.   Aptt in goal range this AM.  No overt bleeding or complications noted.  Platelet count drifting down.  Goal of Therapy:  Heparin level 0.3-0.7 units/ml aPTT 66-102 seconds Monitor platelets by anticoagulation protocol: Yes   Plan:  -Continue IV heparin at current rate. -Daily heparin level, aPTT, and CBC. -F/u plans for cath eventually.  [address], [facility] Clinical Pharmacist Phone [phone]  01/09/2020 8:36 AM

## 2020-01-09 NOTE — Progress Notes (Signed)
Physical Therapy Treatment Patient Details Name: [name] MRN: [id number] DOB: 16-Feb-1929 Today's Date: 01/09/2020    History of Present Illness Patient is a 86 y/o male who presents with Acute on chronic systolic CHF/nonischemic cardiomyopathy. PMh includes systolic dysfunction CHF, nonischemic cardiomyopathy, pacemaker placement, HTN, DM, HLD, frequent PVCs.    PT Comments    Pt tolerated treatment well, asymptomatic with no dizziness or weakness in LE reported although still limited in ambulation distance. Pt without LE buckling during session and demonstrating reduced assistance requirements during functional activity. Pt will continue to benefit from aggressive acute PT intervention to improve activity tolerance and reduce falls risk. PT recommending discharge home with home health PT and 24/7 assistance (for all OOB mobility) from pt's sister and family.   Follow Up Recommendations  Home health PT;Supervision/Assistance - 24 hour     Equipment Recommendations  None recommended by PT    Recommendations for Other Services       Precautions / Restrictions Precautions Precautions: Fall Restrictions Weight Bearing Restrictions: No    Mobility  Bed Mobility Overal bed mobility: (pt received and left in recliner)                Transfers Overall transfer level: Needs assistance Equipment used: Rolling walker (2 wheeled) Transfers: Sit to/from Stand Sit to Stand: Min assist;Min guard(minA progressing to minG)            Ambulation/Gait Ambulation/Gait assistance: Min guard Gait Distance (Feet): 50 Feet(50' x 2 separated by seated rest break) Assistive device: Rolling walker (2 wheeled) Gait Pattern/deviations: Step-to pattern Gait velocity: decreased Gait velocity interpretation: <1.8 ft/sec, indicate of risk for recurrent falls General Gait Details: short step to gait with reduced gait speed   Stairs             Wheelchair Mobility     Modified Rankin (Stroke Patients Only)       Balance Overall balance assessment: Needs assistance Sitting-balance support: No upper extremity supported;Feet supported Sitting balance-Leahy Scale: Good Sitting balance - Comments: close supervision   Standing balance support: Bilateral upper extremity supported Standing balance-Leahy Scale: Fair Standing balance comment: minG with BUE support of RW                            Cognition Arousal/Alertness: Awake/alert Behavior During Therapy: WFL for tasks assessed/performed Overall Cognitive Status: Within Functional Limits for tasks assessed                                        Exercises      General Comments General comments (skin integrity, edema, etc.): VSS on RA, BP of 83/70 pre-mobility and 91/46 post-mobility. Pt asymptomatic      Pertinent Vitals/Pain Pain Assessment: No/denies pain    Home Living                      Prior Function            PT Goals (current goals can now be found in the care plan section) Acute Rehab PT Goals Patient Stated Goal: to go home Progress towards PT goals: Progressing toward goals(slowly)    Frequency    Min 3X/week      PT Plan Current plan remains appropriate    Co-evaluation  AM-PAC PT "6 Clicks" Mobility   Outcome Measure  Help needed turning from your back to your side while in a flat bed without using bedrails?: A Little Help needed moving from lying on your back to sitting on the side of a flat bed without using bedrails?: A Little Help needed moving to and from a bed to a chair (including a wheelchair)?: A Little Help needed standing up from a chair using your arms (e.g., wheelchair or bedside chair)?: A Little Help needed to walk in hospital room?: A Little Help needed climbing 3-5 steps with a railing? : A Lot 6 Click Score: 17    End of Session Equipment Utilized During Treatment: Gait  belt Activity Tolerance: Patient tolerated treatment well Patient left: in chair;with call bell/phone within reach Nurse Communication: Mobility status PT Visit Diagnosis: Unsteadiness on feet (R26.81);Difficulty in walking, not elsewhere classified (R26.2)     Time: 9971-8209 PT Time Calculation (min) (ACUTE ONLY): 28 min  Charges:  $Gait Training: 23-37 mins                     Arlyss Gandy, PT, DPT Acute Rehabilitation Pager: [phone]    Arlyss Gandy 01/09/2020, 12:32 PM

## 2020-01-09 NOTE — Progress Notes (Signed)
Patient ID: [employer], male   DOB: August 17, 1929, 86 y.o.   MRN: [id number]     Advanced Heart Failure Rounding Note  PCP-Cardiologist: Olga Millers, MD   Subjective:    Doing well this morning, asking to go home.  He diuresed nicely again, weight down 5 lbs.   Creatinine trending down 1.7>1.2>1.05.  CVP 9, co-ox 74%.  He remains on dobutamine 2.5.     Objective:   Weight Range: 102.2 kg Body mass index is 34.26 kg/m.   Vital Signs:   Temp:  [93.7 F (34.3 C)-98 F (36.7 C)] 97.6 F (36.4 C) (01/30 0718) Pulse Rate:  [32-101] 80 (01/30 0800) Resp:  [13-26] 24 (01/30 0800) BP: (83-126)/(51-77) 101/61 (01/30 0800) SpO2:  [94 %-100 %] 98 % (01/30 0800) Weight:  [102.2 kg] 102.2 kg (01/30 0500) Last BM Date: 01/03/20  Weight change: Filed Weights   01/07/20 1200 01/08/20 0323 01/09/20 0500  Weight: 104.8 kg 104.7 kg 102.2 kg    Intake/Output:   Intake/Output Summary (Last 24 hours) at 01/09/2020 0930 Last data filed at 01/09/2020 0615 Gross per 24 hour  Intake 418.84 ml  Output 2950 ml  Net -2531.16 ml      Physical Exam   CVP 9 General: NAD Neck: No JVD, no thyromegaly or thyroid nodule.  Lungs: Clear to auscultation bilaterally with normal respiratory effort. CV: Nondisplaced PMI.  Heart regular S1/S2, no S3/S4, no murmur.  1+ edema to knees with lower legs wrapped.   Abdomen: Soft, nontender, no hepatosplenomegaly, no distention.  Skin: Intact without lesions or rashes.  Neurologic: Alert and oriented x 3, mild memory deficit.  Psych: Normal affect. Extremities: No clubbing or cyanosis.  HEENT: Normal.    Telemetry   AF with BiV pacing 70s bpm Personally reviewed   Labs    CBC Recent Labs    01/08/20 0333 01/09/20 0406  WBC 6.3 6.2  HGB 13.3 12.5*  HCT 39.0 38.2*  MCV 92.2 93.2  PLT 69* 74*   Basic Metabolic Panel Recent Labs    70/62/37 0333 01/09/20 0406  NA 128* 132*  K 3.7 3.6  CL 86* 88*  CO2 32 34*  GLUCOSE 155*  142*  BUN 66* 49*  CREATININE 1.25* 1.05  CALCIUM 8.5* 8.3*   Liver Function Tests No results for input(s): AST, ALT, ALKPHOS, BILITOT, PROT, ALBUMIN in the last 72 hours. No results for input(s): LIPASE, AMYLASE in the last 72 hours. Cardiac Enzymes No results for input(s): CKTOTAL, CKMB, CKMBINDEX, TROPONINI in the last 72 hours.  BNP: BNP (last 3 results) Recent Labs    12/30/19 0326 01/03/20 0849 01/05/20 1019  BNP 1,321.1* 1,494.6* 1,374.1*    ProBNP (last 3 results) No results for input(s): PROBNP in the last 8760 hours.   D-Dimer No results for input(s): DDIMER in the last 72 hours. Hemoglobin A1C No results for input(s): HGBA1C in the last 72 hours. Fasting Lipid Panel No results for input(s): CHOL, HDL, LDLCALC, TRIG, CHOLHDL, LDLDIRECT in the last 72 hours. Thyroid Function Tests No results for input(s): TSH, T4TOTAL, T3FREE, THYROIDAB in the last 72 hours.  Invalid input(s): FREET3  Other results:   Imaging    No results found.   Medications:     Scheduled Medications: . allopurinol  100 mg Oral QPC breakfast  . atorvastatin  20 mg Oral QPC supper  . Chlorhexidine Gluconate Cloth  6 each Topical Q0600  . digoxin  0.125 mg Oral Daily  . docusate sodium  100  mg Oral BID  . donepezil  5 mg Oral QHS  . losartan  12.5 mg Oral Daily  . magnesium oxide  400 mg Oral Daily  . Melatonin  6 mg Oral QHS  . sodium chloride flush  10-40 mL Intracatheter Q12H  . spironolactone  12.5 mg Oral Daily  . tamsulosin  0.4 mg Oral BID PC  . torsemide  40 mg Oral BID    Infusions: . DOBUTamine 2.5 mcg/kg/min (01/09/20 0600)  . heparin 1,000 Units/hr (01/09/20 0600)  . norepinephrine (LEVOPHED) Adult infusion      PRN Medications: acetaminophen, ipratropium-albuterol, ondansetron (ZOFRAN) IV, sodium chloride flush, zolpidem    Assessment/Plan    1. Cardiogenic shock - NICM, echo 12/12/19 EF 30-35% with mild-moderate MR and moderate RV dysfunction.  -  Normal cors on cath 2006 - s/p CRT-P - CO-OX stable 74%.  - Decrease dobutamine to 1.5 mcg/kg/min, hopefully to off tomorrow.  - Volume status improving, CVP down to 9 today. Transition to torsemide 40 mg po bid.  - Renal function improving, creatinine 1.05 today.  - Continue digoxin and spironolactone 12.5 daily, will add losartan 12.5 daily.   2. Acute on chronic systolic HF - plan as above  3. Permanent AF - s/p CRT-P - Heparin gtt, restart Eliquis prior to discharge.   4. AKI - due to shock - Creatinine trending down 1.7>1.25>1.05  - hemodynamic support - Follow BMET daily   5. DM2 - cover with SSI  6. Hyponatremia  Sodium 132   7. DNR/DNI   Mobilize   Length of Stay: [place], MD  01/09/2020, 9:30 AM  Advanced Heart Failure Team Pager [phone] (M-F; 7a - 4p)  Please contact [facility] for night-coverage after hours (4p -7a ) and weekends on [email or web address]

## 2020-01-09 NOTE — Plan of Care (Signed)
  Problem: Education: Goal: Ability to demonstrate management of disease process will improve Outcome: Progressing Goal: Ability to verbalize understanding of medication therapies will improve Outcome: Progressing Goal: Individualized Educational Video(s) Outcome: Progressing   Problem: Activity: Goal: Capacity to carry out activities will improve Outcome: Progressing   Problem: Cardiac: Goal: Ability to achieve and maintain adequate cardiopulmonary perfusion will improve Outcome: Progressing   Problem: Education: Goal: Knowledge of General Education information will improve Description: Including pain rating scale, medication(s)/side effects and non-pharmacologic comfort measures Outcome: Progressing   Problem: Health Behavior/Discharge Planning: Goal: Ability to manage health-related needs will improve Outcome: Progressing   Problem: Clinical Measurements: Goal: Ability to maintain clinical measurements within normal limits will improve Outcome: Progressing Goal: Will remain free from infection Outcome: Progressing Goal: Diagnostic test results will improve Outcome: Progressing Goal: Respiratory complications will improve Outcome: Progressing Goal: Cardiovascular complication will be avoided Outcome: Progressing   Problem: Activity: Goal: Risk for activity intolerance will decrease Outcome: Progressing   Problem: Coping: Goal: Level of anxiety will decrease Outcome: Progressing   Problem: Safety: Goal: Ability to remain free from injury will improve Outcome: Progressing   Problem: Skin Integrity: Goal: Risk for impaired skin integrity will decrease Outcome: Progressing

## 2020-01-10 LAB — BASIC METABOLIC PANEL
Anion gap: 9 (ref 5–15)
BUN: 31 mg/dL — ABNORMAL HIGH (ref 8–23)
CO2: 36 mmol/L — ABNORMAL HIGH (ref 22–32)
Calcium: 8.2 mg/dL — ABNORMAL LOW (ref 8.9–10.3)
Chloride: 87 mmol/L — ABNORMAL LOW (ref 98–111)
Creatinine, Ser: 0.92 mg/dL (ref 0.61–1.24)
GFR calc Af Amer: >60 mL/min (ref >60)
GFR calc non Af Amer: >60 mL/min (ref >60)
Glucose, Bld: 161 mg/dL — ABNORMAL HIGH (ref 70–99)
Potassium: 3.6 mmol/L (ref 3.5–5.1)
Sodium: 132 mmol/L — ABNORMAL LOW (ref 135–145)

## 2020-01-10 LAB — CBC
HCT: 36.1 % — ABNORMAL LOW (ref 39.0–52.0)
Hemoglobin: 12 g/dL — ABNORMAL LOW (ref 13.0–17.0)
MCH: 31 pg (ref 26.0–34.0)
MCHC: 33.2 g/dL (ref 30.0–36.0)
MCV: 93.3 fL (ref 80.0–100.0)
Platelets: 72 10*3/uL — ABNORMAL LOW (ref 150–400)
RBC: 3.87 MIL/uL — ABNORMAL LOW (ref 4.22–5.81)
RDW: 14.2 % (ref 11.5–15.5)
WBC: 5.8 10*3/uL (ref 4.0–10.5)
nRBC: 0 % (ref 0.0–0.2)

## 2020-01-10 LAB — APTT
aPTT: 109 seconds — ABNORMAL HIGH (ref 24–36)
aPTT: 105 seconds — ABNORMAL HIGH (ref 24–36)

## 2020-01-10 LAB — COOXEMETRY PANEL
Carboxyhemoglobin: 2.1 % — ABNORMAL HIGH (ref 0.5–1.5)
Methemoglobin: 1.1 % (ref 0.0–1.5)
O2 Saturation: 72.6 %
Total hemoglobin: 12.3 g/dL (ref 12.0–16.0)

## 2020-01-10 LAB — MAGNESIUM
Magnesium: 1.4 mg/dL — ABNORMAL LOW (ref 1.7–2.4)
Magnesium: 1.6 mg/dL — ABNORMAL LOW (ref 1.7–2.4)

## 2020-01-10 LAB — HEPARIN LEVEL (UNFRACTIONATED): Heparin Unfractionated: 0.77 IU/mL — ABNORMAL HIGH (ref 0.30–0.70)

## 2020-01-10 MED ORDER — TORSEMIDE 20 MG PO TABS
40.0000 mg | ORAL_TABLET | Freq: Every day | ORAL | Status: DC
  Administered 2020-01-11: 08:00:00 40 mg via ORAL
  Filled 2020-01-10: qty 2

## 2020-01-10 MED ORDER — MAGNESIUM SULFATE 4 GM/100ML IV SOLN
4.0000 g | Freq: Once | INTRAVENOUS | Status: AC
  Administered 2020-01-10: 09:00:00 4 g via INTRAVENOUS
  Filled 2020-01-10: qty 100

## 2020-01-10 MED ORDER — MAGNESIUM SULFATE 4 GM/100ML IV SOLN: 4.0000 g | Freq: Once | INTRAVENOUS | Status: DC

## 2020-01-10 MED ORDER — POTASSIUM CHLORIDE CRYS ER 20 MEQ PO TBCR
30.0000 meq | EXTENDED_RELEASE_TABLET | Freq: Four times a day (QID) | ORAL | Status: AC
  Administered 2020-01-10 (×2): 30 meq via ORAL
  Filled 2020-01-10 (×2): qty 1

## 2020-01-10 NOTE — Progress Notes (Signed)
ANTICOAGULATION CONSULT NOTE - Follow-Up Consult  Pharmacy Consult for apixaban>>heparin Indication: chest pain/ACS and atrial fibrillation  No Known Allergies  Patient Measurements: Height: 5\' 8"  (172.7 cm) Weight: 221 lb 12.5 oz (100.6 kg) IBW/kg (Calculated) : 68.4 Heparin Dosing Weight: 91kg  Vital Signs: Temp: 97.4 F (36.3 C) (01/31 1848) Temp Source: Axillary (01/31 1848) BP: 105/79 (01/31 1848) Pulse Rate: 41 (01/31 1800)  Labs: Recent Labs    01/08/20 0333 01/08/20 0532 01/09/20 0406 01/10/20 0330 01/10/20 0826 01/10/20 1813  HGB 13.3   < > 12.5* 12.0*  --   --   HCT 39.0  --  38.2* 36.1*  --   --   PLT 69*  --  74* 72*  --   --   APTT 129*   < > 89* 109*  --  105*  HEPARINUNFRC >2.20*  --   --   --  0.77*  --   CREATININE 1.25*  --  1.05  --  0.92  --    < > = values in this interval not displayed.    Estimated Creatinine Clearance: 63.8 mL/min (by C-G formula based on SCr of 0.92 mg/dL).  Assessment: 86 year old male with permanent afib on apixaban prior to admit started on IV heparin in anticipation of cardiac cath.   Aptt 105sec at top of goal on heparin drip 950 uts/hr. No overt bleeding or complications noted.  Platelet low stable   Goal of Therapy:  Heparin level 0.3-0.7 units/ml aPTT 66-102 seconds Monitor platelets by anticoagulation protocol: Yes   Plan:  -decrease  IV heparin drip 850 uts/hr -Daily heparin level, aPTT, and CBC. -F/u plans for cath eventually.   98 Pharm.D. CPP, BCPS Clinical Pharmacist [phone] 01/10/2020 7:09 PM

## 2020-01-10 NOTE — Progress Notes (Signed)
Patient ID: [employer], male   DOB: February 03, 1929, 86 y.o.   MRN: [id number]     Advanced Heart Failure Rounding Note  PCP-Cardiologist: Kirk Ruths, MD   Subjective:    Doing well this morning, wants to go home today.  Weight down again.   Creatinine has been trending down but pending this morning.  CVP 6-7, co-ox 73%.  He remains on dobutamine 1.5.     Objective:   Weight Range: 100.6 kg Body mass index is 33.72 kg/m.   Vital Signs:   Temp:  [96.5 F (35.8 C)-97.9 F (36.6 C)] 97.9 F (36.6 C) (01/31 0700) Pulse Rate:  [67-135] 87 (01/31 0600) Resp:  [11-30] 20 (01/31 0745) BP: (78-120)/(51-76) 105/59 (01/31 0732) SpO2:  [95 %-100 %] 97 % (01/31 0745) Weight:  [100.6 kg] 100.6 kg (01/31 0336) Last BM Date: 12/30/19  Weight change: Filed Weights   01/08/20 0323 01/09/20 0500 01/10/20 0336  Weight: 104.7 kg 102.2 kg 100.6 kg    Intake/Output:   Intake/Output Summary (Last 24 hours) at 01/10/2020 0903 Last data filed at 01/10/2020 0800 Gross per 24 hour  Intake 1509.01 ml  Output 2800 ml  Net -1290.99 ml      Physical Exam   CVP 6-7 General: NAD Neck: No JVD, no thyromegaly or thyroid nodule.  Lungs: Clear to auscultation bilaterally with normal respiratory effort. CV: Nondisplaced PMI.  Heart regular S1/S2, no S3/S4, no murmur.  Lower legs wrapped. Abdomen: Soft, nontender, no hepatosplenomegaly, no distention.  Skin: Intact without lesions or rashes.  Neurologic: Alert and oriented x 3.  Psych: Normal affect. Extremities: No clubbing or cyanosis.  HEENT: Normal.    Telemetry   AF with BiV pacing 90s bpm Personally reviewed   Labs    CBC Recent Labs    01/09/20 0406 01/10/20 0330  WBC 6.2 5.8  HGB 12.5* 12.0*  HCT 38.2* 36.1*  MCV 93.2 93.3  PLT 74* 72*   Basic Metabolic Panel Recent Labs    01/08/20 0333 01/09/20 0406 01/10/20 0330  NA 128* 132*  --   K 3.7 3.6  --   CL 86* 88*  --   CO2 32 34*  --   GLUCOSE 155*  142*  --   BUN 66* 49*  --   CREATININE 1.25* 1.05  --   CALCIUM 8.5* 8.3*  --   MG  --   --  1.4*   Liver Function Tests No results for input(s): AST, ALT, ALKPHOS, BILITOT, PROT, ALBUMIN in the last 72 hours. No results for input(s): LIPASE, AMYLASE in the last 72 hours. Cardiac Enzymes No results for input(s): CKTOTAL, CKMB, CKMBINDEX, TROPONINI in the last 72 hours.  BNP: BNP (last 3 results) Recent Labs    12/30/19 0326 01/03/20 0849 01/05/20 1019  BNP 1,321.1* 1,494.6* 1,374.1*    ProBNP (last 3 results) No results for input(s): PROBNP in the last 8760 hours.   D-Dimer No results for input(s): DDIMER in the last 72 hours. Hemoglobin A1C No results for input(s): HGBA1C in the last 72 hours. Fasting Lipid Panel No results for input(s): CHOL, HDL, LDLCALC, TRIG, CHOLHDL, LDLDIRECT in the last 72 hours. Thyroid Function Tests No results for input(s): TSH, T4TOTAL, T3FREE, THYROIDAB in the last 72 hours.  Invalid input(s): FREET3  Other results:   Imaging    No results found.   Medications:     Scheduled Medications: . allopurinol  100 mg Oral QPC breakfast  . atorvastatin  20 mg Oral  QPC supper  . Chlorhexidine Gluconate Cloth  6 each Topical Q0600  . digoxin  0.125 mg Oral Daily  . docusate sodium  100 mg Oral BID  . donepezil  5 mg Oral QHS  . losartan  12.5 mg Oral Daily  . magnesium oxide  400 mg Oral Daily  . Melatonin  6 mg Oral QHS  . sodium chloride flush  10-40 mL Intracatheter Q12H  . spironolactone  12.5 mg Oral Daily  . tamsulosin  0.4 mg Oral BID PC  . [START ON 01/11/2020] torsemide  40 mg Oral Daily    Infusions: . DOBUTamine 1.5 mcg/kg/min (01/10/20 0800)  . heparin 950 Units/hr (01/10/20 0800)  . magnesium sulfate bolus IVPB 4 g (01/10/20 0845)  . norepinephrine (LEVOPHED) Adult infusion      PRN Medications: acetaminophen, ipratropium-albuterol, ondansetron (ZOFRAN) IV, sodium chloride flush, zolpidem    Assessment/Plan     1. Cardiogenic shock - NICM, echo 12/12/19 EF 30-35% with mild-moderate MR and moderate RV dysfunction.  - Normal cors on cath 2006 - s/p CRT-P - CO-OX stable 73%.  - Stop dobutamine today.   - Volume status improved, CVP down to 6-7 today. Will decrease torsemide to 40 mg daily for now.  - Renal function has been improving, pending BMET today.  - Continue digoxin, spironolactone 12.5 daily, and losartan 12.5 daily. SBP 90s generally so will not titrate.   2. Acute on chronic systolic HF - plan as above  3. Permanent AF - s/p CRT-P - Heparin gtt, restart Eliquis prior to discharge.   4. AKI - due to shock - Creatinine trending down 1.7>1.25>1.05>Pending - Follow BMET daily   5. DM2 - cover with SSI  6. Hyponatremia  Sodium 132   7. Dementia - Aricept  7. DNR/DNI   Mobilize. Wants to go home today, told him that we need to stop dobutamine and reassess him in morning.  Will send to step-down.    Length of Stay: 52  Marca Ancona, MD  01/10/2020, 9:03 AM  Advanced Heart Failure Team Pager [phone] (M-F; 7a - 4p)  Please contact [facility] for night-coverage after hours (4p -7a ) and weekends on [email or web address]

## 2020-01-11 ENCOUNTER — Encounter: Payer: Medicare HMO | Admitting: Internal Medicine

## 2020-01-11 LAB — HEPARIN LEVEL (UNFRACTIONATED): Heparin Unfractionated: 0.34 IU/mL (ref 0.30–0.70)

## 2020-01-11 LAB — BASIC METABOLIC PANEL
Anion gap: 10 (ref 5–15)
BUN: 28 mg/dL — ABNORMAL HIGH (ref 8–23)
CO2: 32 mmol/L (ref 22–32)
Calcium: 8.3 mg/dL — ABNORMAL LOW (ref 8.9–10.3)
Chloride: 88 mmol/L — ABNORMAL LOW (ref 98–111)
Creatinine, Ser: 0.81 mg/dL (ref 0.61–1.24)
GFR calc Af Amer: >60 mL/min (ref >60)
GFR calc non Af Amer: >60 mL/min (ref >60)
Glucose, Bld: 132 mg/dL — ABNORMAL HIGH (ref 70–99)
Potassium: 4 mmol/L (ref 3.5–5.1)
Sodium: 130 mmol/L — ABNORMAL LOW (ref 135–145)

## 2020-01-11 LAB — COOXEMETRY PANEL
Carboxyhemoglobin: 2 % — ABNORMAL HIGH (ref 0.5–1.5)
Methemoglobin: 1 % (ref 0.0–1.5)
O2 Saturation: 66 %
Total hemoglobin: 12.7 g/dL (ref 12.0–16.0)

## 2020-01-11 LAB — CBC
HCT: 37.8 % — ABNORMAL LOW (ref 39.0–52.0)
Hemoglobin: 12.4 g/dL — ABNORMAL LOW (ref 13.0–17.0)
MCH: 30.6 pg (ref 26.0–34.0)
MCHC: 32.8 g/dL (ref 30.0–36.0)
MCV: 93.3 fL (ref 80.0–100.0)
Platelets: 72 10*3/uL — ABNORMAL LOW (ref 150–400)
RBC: 4.05 MIL/uL — ABNORMAL LOW (ref 4.22–5.81)
RDW: 14.3 % (ref 11.5–15.5)
WBC: 6.6 10*3/uL (ref 4.0–10.5)
nRBC: 0 % (ref 0.0–0.2)

## 2020-01-11 LAB — APTT: aPTT: 61 seconds — ABNORMAL HIGH (ref 24–36)

## 2020-01-11 LAB — MAGNESIUM: Magnesium: 1.8 mg/dL (ref 1.7–2.4)

## 2020-01-11 MED ORDER — APIXABAN 5 MG PO TABS
5.0000 mg | ORAL_TABLET | Freq: Two times a day (BID) | ORAL | Status: DC
  Administered 2020-01-11 – 2020-01-17 (×13): 5 mg via ORAL
  Filled 2020-01-11 (×13): qty 1

## 2020-01-11 MED ORDER — FUROSEMIDE 10 MG/ML IJ SOLN
80.0000 mg | Freq: Two times a day (BID) | INTRAMUSCULAR | Status: DC
  Administered 2020-01-11 – 2020-01-12 (×2): 80 mg via INTRAVENOUS
  Filled 2020-01-11 (×2): qty 8

## 2020-01-11 NOTE — Progress Notes (Signed)
Physical Therapy Treatment Patient Details Name: [name] MRN: [id number] DOB: December 27, 1928 Today's Date: 01/11/2020    History of Present Illness Patient is a 86 y/o male who presents with Acute on chronic systolic CHF/nonischemic cardiomyopathy. PMh includes systolic dysfunction CHF, nonischemic cardiomyopathy, pacemaker placement, HTN, DM, HLD, frequent PVCs.    PT Comments    Pt with request to have BM and was very particular about room set up and directed his care. Due to increased time with tolieting, we were unable to ambulate today. Pt however required min/modA for transfers. Pt with decreased insight to deficits and safety. If pt doesn't have 24/7 assist then pt will need ST-SNF upon d/c as pt is at a high falls risk and has impaired cognition in which it would be unsafe for pt to manage by himself. Acute PT to cont to follow.     Follow Up Recommendations  Home health PT;Supervision/Assistance - 24 hour- someone that can provide min/modA to patient     Equipment Recommendations  Rolling walker with 5" wheels    Recommendations for Other Services       Precautions / Restrictions Precautions Precautions: Fall Restrictions Weight Bearing Restrictions: No    Mobility  Bed Mobility Overal bed mobility: Needs Assistance Bed Mobility: Supine to Sit     Supine to sit: HOB elevated;Min assist     General bed mobility comments: increased time, labored effort, pt reached for PTs hand to pull up on  Transfers Overall transfer level: Needs assistance Equipment used: Rolling walker (2 wheeled) Transfers: Sit to/from [employer] Sit to Stand: Min assist;+2 safety/equipment Stand pivot transfers: Mod assist;+2 safety/equipment       General transfer comment: minA to power up from elevated surface, max directional verbal cues for safety and to push up from bed not pull up on walker, verbal cues for sequencing std pvt to chair  Ambulation/Gait             General Gait Details: deferred as pt needing to have BM   Stairs             Wheelchair Mobility    Modified Rankin (Stroke Patients Only)       Balance Overall balance assessment: Needs assistance Sitting-balance support: No upper extremity supported;Feet supported Sitting balance-Leahy Scale: Good Sitting balance - Comments: close supervision   Standing balance support: Bilateral upper extremity supported Standing balance-Leahy Scale: Poor Standing balance comment: dependent on bilat UE support                            Cognition Arousal/Alertness: Awake/alert Behavior During Therapy: Agitated(desires to have bowel movement, very particular about room s) Overall Cognitive Status: Impaired/Different from baseline Area of Impairment: Safety/judgement;Problem solving                         Safety/Judgement: Decreased awareness of safety;Decreased awareness of deficits Awareness: Emergent(reported needing to have BM) Problem Solving: Difficulty sequencing;Requires verbal cues;Requires tactile cues General Comments: pt very particular about room set up and directing how he wants to do things with decreased awareness of safety with line management and deficits, increased time       Exercises      General Comments General comments (skin integrity, edema, etc.): VSS, pt assisted to [facility], pt performed hygiene s/p BM in sitting then asked for assistance in standing with wet wash cloth      Pertinent  Vitals/Pain Pain Assessment: No/denies pain    Home Living                      Prior Function            PT Goals (current goals can now be found in the care plan section) Acute Rehab PT Goals Time For Goal Achievement: 01/25/20 Progress towards PT goals: Progressing toward goals    Frequency    Min 3X/week      PT Plan Current plan remains appropriate    Co-evaluation              AM-PAC PT "6  Clicks" Mobility   Outcome Measure  Help needed turning from your back to your side while in a flat bed without using bedrails?: A Little Help needed moving from lying on your back to sitting on the side of a flat bed without using bedrails?: A Little Help needed moving to and from a bed to a chair (including a wheelchair)?: A Little Help needed standing up from a chair using your arms (e.g., wheelchair or bedside chair)?: A Little Help needed to walk in hospital room?: A Little Help needed climbing 3-5 steps with a railing? : A Lot 6 Click Score: 17    End of Session Equipment Utilized During Treatment: Gait belt Activity Tolerance: Patient tolerated treatment well Patient left: in chair;with call bell/phone within reach;with chair alarm set Nurse Communication: Mobility status PT Visit Diagnosis: Unsteadiness on feet (R26.81);Difficulty in walking, not elsewhere classified (R26.2)     Time: 4709-6283 PT Time Calculation (min) (ACUTE ONLY): 23 min  Charges:  $Therapeutic Activity: 23-37 mins                     Kittie Plater, PT, DPT Acute Rehabilitation Services Pager #: [phone] Office #: [phone]    Berline Lopes 01/11/2020, 2:36 PM

## 2020-01-11 NOTE — Care Management Important Message (Signed)
Important Message  Patient Details  Name: [name] MRN: [id number] Date of Birth: [birth year]   Medicare Important Message Given:  Yes     Renie Ora 01/11/2020, 2:15 PM

## 2020-01-11 NOTE — Progress Notes (Signed)
CARDIAC REHAB PHASE I   PRE:  Rate/Rhythm: 80 pacing    BP: sitting 102/67    SaO2: 100 RA  MODE:  Ambulation: 80 ft   POST:  Rate/Rhythm: 102 pacing    BP: sitting 106/51     SaO2: 100 RA  Pt needed mod assist to stand due to poor foot placement and leaning left. Needed verbal cues to move feet into position that would support him better (wider stance). Able to walk with assist x2, gait belt, RW. No major c/o. Return to recliner, VSS, daughter present.  [id number]   Harriet Masson CES, ACSM 01/11/2020 3:22 PM

## 2020-01-11 NOTE — Plan of Care (Signed)
°  Problem: Education: °Goal: Ability to demonstrate management of disease process will improve °Outcome: Progressing °Goal: Ability to verbalize understanding of medication therapies will improve °Outcome: Progressing °Goal: Individualized Educational Video(s) °Outcome: Progressing °  °

## 2020-01-11 NOTE — Progress Notes (Signed)
ANTICOAGULATION CONSULT NOTE - Follow-Up Consult  Pharmacy Consult for apixaban>>heparin Indication: chest pain/ACS and atrial fibrillation  No Known Allergies  Patient Measurements: Height: 5\' 8"  (172.7 cm) Weight: 175 lb (79.4 kg) IBW/kg (Calculated) : 68.4 Heparin Dosing Weight: 91kg  Vital Signs: Temp: 97.5 F (36.4 C) (02/01 0444) Temp Source: Oral (02/01 0444) BP: 92/54 (02/01 0332)  Labs: Recent Labs    01/09/20 0406 01/09/20 0406 01/10/20 0330 01/10/20 0826 01/10/20 1813 01/11/20 0607  HGB 12.5*   < > 12.0*  --   --  12.4*  HCT 38.2*  --  36.1*  --   --  37.8*  PLT 74*  --  72*  --   --  72*  APTT 89*   < > 109*  --  105* 61*  HEPARINUNFRC  --   --   --  0.77*  --  0.34  CREATININE 1.05  --   --  0.92  --   --    < > = values in this interval not displayed.    Estimated Creatinine Clearance: 53.7 mL/min (by C-G formula based on SCr of 0.92 mg/dL).  Assessment: 86 year old male with permanent afib on apixaban prior to admit started on IV heparin in anticipation of cardiac cath.   Heparin level still falsely up at 0.34, aPTT subtherapeutic at 61 sec, on 850 units/hr. Hgb 12.,4 plt 72. No s/sx of bleeding or infusion issues.   No plans for cardiac cath - will transition back to apixaban.  Goal of Therapy:  Heparin level 0.3-0.7 units/ml aPTT 66-102 seconds Monitor platelets by anticoagulation protocol: Yes   Plan:  -Discontinue heparin gtt -Start apixaban 5 mg BID  -Monitor CBC, and for s/sx of bleeding  98, PharmD, BCCCP Clinical Pharmacist  Phone: [phone]  Please check AMION for all [facility] phone numbers After 10:00 PM, call Main Pharmacy [phone] 01/11/2020 8:12 AM

## 2020-01-11 NOTE — Progress Notes (Addendum)
Patient ID: [employer], male   DOB: [birth year], 86 y.o.   MRN: [id number]     Advanced Heart Failure Rounding Note  PCP-Cardiologist: Kirk Ruths, MD   Subjective:     Dobutamine discontinued yesterday. Co-ox 66% (down from 73% on dobutamine).   BMP pending. Today's wt not accurate. Documented change 221>>175 lb.   CVP 5-6.   No complaints today. Resting comfortably in bed. Laying fairly flat. No orthopnea and no current supp O2 requirements.     Objective:   Weight Range: 79.4 kg Body mass index is 26.61 kg/m.   Vital Signs:   Temp:  [97.1 F (36.2 C)-97.8 F (36.6 C)] 97.5 F (36.4 C) (02/01 0444) Pulse Rate:  [41-106] 41 (01/31 1800) Resp:  [13-25] 18 (01/31 1800) BP: (85-114)/(50-79) 92/54 (02/01 0332) SpO2:  [90 %-100 %] 100 % (01/31 1800) Weight:  [79.4 kg] 79.4 kg (02/01 0455) Last BM Date: 01/09/20  Weight change: Filed Weights   01/09/20 0500 01/10/20 0336 01/11/20 0455  Weight: 102.2 kg 100.6 kg 79.4 kg    Intake/Output:   Intake/Output Summary (Last 24 hours) at 01/11/2020 0729 Last data filed at 01/11/2020 0517 Gross per 24 hour  Intake 769.5 ml  Output 550 ml  Net 219.5 ml      Physical Exam  CVP 5-6 PHYSICAL EXAM: General:  Well appearing elderly WM. No respiratory difficulty HEENT: normal Neck: supple. no JVD. Carotids 2+ bilat; no bruits. No lymphadenopathy or thyromegaly appreciated. Cor: PMI nondisplaced. Regular rate & rhythm. No rubs, gallops or murmurs. Lungs: clear Abdomen: soft, nontender, nondistended. No hepatosplenomegaly. No bruits or masses. Good bowel sounds. Extremities: no cyanosis, clubbing, rash, 1+ bilateral LE edema, bilateral TED hoses Neuro: alert & oriented x 3, cranial nerves grossly intact. moves all 4 extremities w/o difficulty. Affect pleasant.    Telemetry   AF with BiV pacing 80s- 90s bpm Personally reviewed   Labs    CBC Recent Labs    01/10/20 0330 01/11/20 0607  WBC 5.8 6.6  HGB  12.0* 12.4*  HCT 36.1* 37.8*  MCV 93.3 93.3  PLT 72* 72*   Basic Metabolic Panel Recent Labs    01/09/20 0406 01/10/20 0330 01/10/20 0826  NA 132*  --  132*  K 3.6  --  3.6  CL 88*  --  87*  CO2 34*  --  36*  GLUCOSE 142*  --  161*  BUN 49*  --  31*  CREATININE 1.05  --  0.92  CALCIUM 8.3*  --  8.2*  MG  --  1.4* 1.6*   Liver Function Tests No results for input(s): AST, ALT, ALKPHOS, BILITOT, PROT, ALBUMIN in the last 72 hours. No results for input(s): LIPASE, AMYLASE in the last 72 hours. Cardiac Enzymes No results for input(s): CKTOTAL, CKMB, CKMBINDEX, TROPONINI in the last 72 hours.  BNP: BNP (last 3 results) Recent Labs    12/30/19 0326 01/03/20 0849 01/05/20 1019  BNP 1,321.1* 1,494.6* 1,374.1*    ProBNP (last 3 results) No results for input(s): PROBNP in the last 8760 hours.   D-Dimer No results for input(s): DDIMER in the last 72 hours. Hemoglobin A1C No results for input(s): HGBA1C in the last 72 hours. Fasting Lipid Panel No results for input(s): CHOL, HDL, LDLCALC, TRIG, CHOLHDL, LDLDIRECT in the last 72 hours. Thyroid Function Tests No results for input(s): TSH, T4TOTAL, T3FREE, THYROIDAB in the last 72 hours.  Invalid input(s): FREET3  Other results:   Imaging    No  results found.   Medications:     Scheduled Medications: . allopurinol  100 mg Oral QPC breakfast  . atorvastatin  20 mg Oral QPC supper  . Chlorhexidine Gluconate Cloth  6 each Topical Q0600  . digoxin  0.125 mg Oral Daily  . docusate sodium  100 mg Oral BID  . donepezil  5 mg Oral QHS  . losartan  12.5 mg Oral Daily  . magnesium oxide  400 mg Oral Daily  . Melatonin  6 mg Oral QHS  . sodium chloride flush  10-40 mL Intracatheter Q12H  . spironolactone  12.5 mg Oral Daily  . tamsulosin  0.4 mg Oral BID PC  . torsemide  40 mg Oral Daily    Infusions: . heparin 850 Units/hr (01/11/20 0444)  . norepinephrine (LEVOPHED) Adult infusion      PRN  Medications: acetaminophen, ipratropium-albuterol, ondansetron (ZOFRAN) IV, sodium chloride flush, zolpidem    Assessment/Plan    1. Cardiogenic shock - NICM, echo 12/12/19 EF 30-35% with mild-moderate MR and moderate RV dysfunction. (previously 40-45%) - Normal cors on cath 2006 - s/p CRT-P - CO-OX 66% off of dobutamine (down from 73%) - Volume status improved, CVP down to 5-6 today.  - Continue torsemide 40 mg daily.  - Renal function has been improving. Today's BMP pending. - Continue digoxin, spironolactone 12.5 daily, and losartan 12.5 daily. SBP 90s generally so will not switch to [employer].   2. Acute on chronic systolic HF - plan as above  3. Permanent AF - s/p CRT-P - Heparin gtt, restart Eliquis prior to discharge.   4. AKI - due to shock - Creatinine trending down 1.7>1.25>1.05>Pending - Follow BMET daily   5. DM2 - cover with SSI  6. Hyponatremia  - Sodium 132 yesterday -F/u BMP pending    7. Dementia - Aricept  7. DNR/DNI     Length of Stay: [address] PA-C  01/11/2020, 7:29 AM  Advanced Heart Failure Team Pager [phone] (M-F; 7a - 4p)  Please contact [facility] for night-coverage after hours (4p -7a ) and weekends on [email or web address]  Patient seen and examined with the above-signed Advanced Practice Provider and/or Housestaff. I personally reviewed laboratory data, imaging studies and relevant notes. I independently examined the patient and formulated the important aspects of the plan. I have edited the note to reflect any of my changes or salient points. I have personally discussed the plan with the patient and/or family.  Breathing better but he says he still doesn't feel back to baseline. Still edematous and bloated. Weak.   CVP 14. 2-3+ edema on exam with prominent s3.   Resume IV lasix. Suspect short-term prognosis quite limited   PT recommending HHPT/RN  Arvilla Meres, MD  8:54 AM

## 2020-01-12 LAB — COOXEMETRY PANEL
Carboxyhemoglobin: 1.6 % — ABNORMAL HIGH (ref 0.5–1.5)
Carboxyhemoglobin: 2.1 % — ABNORMAL HIGH (ref 0.5–1.5)
Methemoglobin: 0.8 % (ref 0.0–1.5)
Methemoglobin: 1 % (ref 0.0–1.5)
O2 Saturation: 94 %
O2 Saturation: 59.9 %
Total hemoglobin: 10.4 g/dL — ABNORMAL LOW (ref 12.0–16.0)
Total hemoglobin: 12.9 g/dL (ref 12.0–16.0)

## 2020-01-12 LAB — BASIC METABOLIC PANEL
Anion gap: 11 (ref 5–15)
BUN: 27 mg/dL — ABNORMAL HIGH (ref 8–23)
CO2: 35 mmol/L — ABNORMAL HIGH (ref 22–32)
Calcium: 8.4 mg/dL — ABNORMAL LOW (ref 8.9–10.3)
Chloride: 89 mmol/L — ABNORMAL LOW (ref 98–111)
Creatinine, Ser: 0.84 mg/dL (ref 0.61–1.24)
GFR calc Af Amer: >60 mL/min (ref >60)
GFR calc non Af Amer: >60 mL/min (ref >60)
Glucose, Bld: 130 mg/dL — ABNORMAL HIGH (ref 70–99)
Potassium: 3.6 mmol/L (ref 3.5–5.1)
Sodium: 135 mmol/L (ref 135–145)

## 2020-01-12 LAB — CBC
HCT: 38.2 % — ABNORMAL LOW (ref 39.0–52.0)
Hemoglobin: 12.6 g/dL — ABNORMAL LOW (ref 13.0–17.0)
MCH: 30.7 pg (ref 26.0–34.0)
MCHC: 33 g/dL (ref 30.0–36.0)
MCV: 92.9 fL (ref 80.0–100.0)
Platelets: 68 10*3/uL — ABNORMAL LOW (ref 150–400)
RBC: 4.11 MIL/uL — ABNORMAL LOW (ref 4.22–5.81)
RDW: 14.6 % (ref 11.5–15.5)
WBC: 5.2 10*3/uL (ref 4.0–10.5)
nRBC: 0 % (ref 0.0–0.2)

## 2020-01-12 LAB — MAGNESIUM: Magnesium: 1.5 mg/dL — ABNORMAL LOW (ref 1.7–2.4)

## 2020-01-12 MED ORDER — POTASSIUM CHLORIDE CRYS ER 20 MEQ PO TBCR
40.0000 meq | EXTENDED_RELEASE_TABLET | Freq: Three times a day (TID) | ORAL | Status: AC
  Administered 2020-01-12 (×3): 40 meq via ORAL
  Filled 2020-01-12 (×3): qty 2

## 2020-01-12 MED ORDER — ACETAZOLAMIDE 250 MG PO TABS
250.0000 mg | ORAL_TABLET | Freq: Two times a day (BID) | ORAL | Status: AC
  Administered 2020-01-12 (×2): 250 mg via ORAL
  Filled 2020-01-12 (×2): qty 1

## 2020-01-12 MED ORDER — FUROSEMIDE 10 MG/ML IJ SOLN
120.0000 mg | Freq: Two times a day (BID) | INTRAVENOUS | Status: DC
  Administered 2020-01-12 – 2020-01-13 (×2): 120 mg via INTRAVENOUS
  Filled 2020-01-12: qty 2
  Filled 2020-01-12: qty 12
  Filled 2020-01-12: qty 10

## 2020-01-12 NOTE — Plan of Care (Signed)
  Problem: Education: Goal: Ability to demonstrate management of disease process will improve Outcome: Progressing Goal: Ability to verbalize understanding of medication therapies will improve Outcome: Progressing Goal: Individualized Educational Video(s) Outcome: Progressing   Problem: Cardiac: Goal: Ability to achieve and maintain adequate cardiopulmonary perfusion will improve Outcome: Progressing   Problem: Education: Goal: Knowledge of General Education information will improve Description Including pain rating scale, medication(s)/side effects and non-pharmacologic comfort measures Outcome: Progressing   

## 2020-01-12 NOTE — Progress Notes (Addendum)
Patient ID: [employer], male   DOB: 06/19/1929, 86 y.o.   MRN: [id number]     Advanced Heart Failure Rounding Note  PCP-Cardiologist: Olga Millers, MD   Subjective:     Dobutamine discontinued 1/31. Co-ox resulted at 94%. Doubt accurate. Will repeat.   Placed back on IV diuretics yesterday for volume overload and elevated CVP. 80 IV Lasix BID. Only 1.7L in UOP. Wt up 1 lb. SCr normal, 0.84. K 3.6.   CVP 6-7 on my read.   Appears more confused today to time and place. SBP soft in the 90s. Tachycardic, 110s w/ frequent PVCs.      Objective:   Weight Range: 79.4 kg Body mass index is 26.61 kg/m.   Vital Signs:   Temp:  [97.4 F (36.3 C)-97.5 F (36.4 C)] 97.4 F (36.3 C) (02/02 0651) Pulse Rate:  [45-93] 45 (02/02 0651) Resp:  [13-25] 18 (02/02 0124) BP: (93-125)/(60-107) 125/107 (02/02 0651) SpO2:  [93 %-100 %] 96 % (02/02 0651) Last BM Date: 01/09/20  Weight change: Filed Weights   01/09/20 0500 01/10/20 0336 01/11/20 0455  Weight: 102.2 kg 100.6 kg 79.4 kg    Intake/Output:   Intake/Output Summary (Last 24 hours) at 01/12/2020 0753 Last data filed at 01/12/2020 0200 Gross per 24 hour  Intake 540 ml  Output 1750 ml  Net -1210 ml      Physical Exam  CVP 6-7 PHYSICAL EXAM: General:  Elderly WM, pleasantly confused. No respiratory difficulty HEENT: normal  Neck: supple. difficult to assess JVD. +Rt IJ CVC, Carotids 2+ bilat; no bruits. No lymphadenopathy or thyromegaly appreciated. Cor: PMI nondisplaced. Regular rhythm, tachy rate. +S3 Lungs: clear Abdomen: obese but soft, nontender, nondistended. No hepatosplenomegaly. No bruits or masses. Good bowel sounds. Extremities: no cyanosis, clubbing, rash, 1+ bilateral LE edema, bilateral TED hoses Neuro: alert & oriented to person, confused to time and place, cranial nerves grossly intact. moves all 4 extremities w/o difficulty. Affect pleasant.    Telemetry   AF with BiV pacing low 100s-110s,  PVCs bpm Personally reviewed   Labs    CBC Recent Labs    01/11/20 0607 01/12/20 0421  WBC 6.6 5.2  HGB 12.4* 12.6*  HCT 37.8* 38.2*  MCV 93.3 92.9  PLT 72* 68*   Basic Metabolic Panel Recent Labs    22/29/79 0826 01/10/20 0826 01/11/20 0926 01/12/20 0421  NA 132*   < > 130* 135  K 3.6   < > 4.0 3.6  CL 87*   < > 88* 89*  CO2 36*   < > 32 35*  GLUCOSE 161*   < > 132* 130*  BUN 31*   < > 28* 27*  CREATININE 0.92   < > 0.81 0.84  CALCIUM 8.2*   < > 8.3* 8.4*  MG 1.6*  --  1.8  --    < > = values in this interval not displayed.   Liver Function Tests No results for input(s): AST, ALT, ALKPHOS, BILITOT, PROT, ALBUMIN in the last 72 hours. No results for input(s): LIPASE, AMYLASE in the last 72 hours. Cardiac Enzymes No results for input(s): CKTOTAL, CKMB, CKMBINDEX, TROPONINI in the last 72 hours.  BNP: BNP (last 3 results) Recent Labs    12/30/19 0326 01/03/20 0849 01/05/20 1019  BNP 1,321.1* 1,494.6* 1,374.1*    ProBNP (last 3 results) No results for input(s): PROBNP in the last 8760 hours.   D-Dimer No results for input(s): DDIMER in the last 72 hours. Hemoglobin A1C  No results for input(s): HGBA1C in the last 72 hours. Fasting Lipid Panel No results for input(s): CHOL, HDL, LDLCALC, TRIG, CHOLHDL, LDLDIRECT in the last 72 hours. Thyroid Function Tests No results for input(s): TSH, T4TOTAL, T3FREE, THYROIDAB in the last 72 hours.  Invalid input(s): FREET3  Other results:   Imaging    No results found.   Medications:     Scheduled Medications: . allopurinol  100 mg Oral QPC breakfast  . apixaban  5 mg Oral BID  . atorvastatin  20 mg Oral QPC supper  . Chlorhexidine Gluconate Cloth  6 each Topical Q0600  . digoxin  0.125 mg Oral Daily  . docusate sodium  100 mg Oral BID  . donepezil  5 mg Oral QHS  . furosemide  80 mg Intravenous BID  . losartan  12.5 mg Oral Daily  . magnesium oxide  400 mg Oral Daily  . Melatonin  6 mg Oral QHS    . sodium chloride flush  10-40 mL Intracatheter Q12H  . spironolactone  12.5 mg Oral Daily  . tamsulosin  0.4 mg Oral BID PC    Infusions:   PRN Medications: acetaminophen, ipratropium-albuterol, ondansetron (ZOFRAN) IV, sodium chloride flush, zolpidem    Assessment/Plan    1. Cardiogenic shock - NICM, echo 12/12/19 EF 30-35% with mild-moderate MR and moderate RV dysfunction. (previously 40-45%) - Normal cors on cath 2006 - s/p CRT-P - dobutamine discontinued 1/31. Co-ox resulted at 94%. ? Accurate. Will repeat - CVP 6-7 on my check.  - On IV Lasix 80 mg bid. SCr stable. K 3.6. Plan transition back to PO diuretics soon +/- titration of spironolactone to 25 mg?   - Continue digoxin, spironolactone 12.5 daily, and losartan 12.5 daily. SBP 90s generally so will not switch to [employer].   2. Acute on chronic systolic HF - plan as above  3. Permanent AF - s/p CRT-P - Eliquis for a/c - continue digoxin - no  blocker 2/2 shock   4. AKI - due to shock - resolved. SCr 0.84 today  - Follow BMET daily   5. DM2 - cover with SSI  6. Hyponatremia  - improved. Na 136   7. Dementia - Aricept  8. DNR/DNI   9. Deconditioning - PT has recommended HH PT/RN + Rolling walker w/ 5" wheels when discharged home (orders placed)    Length of Stay: [address], PA-C  01/12/2020, 7:53 AM  Advanced Heart Failure Team Pager [phone] (M-F; 7a - 4p)  Please contact [facility] for night-coverage after hours (4p -7a ) and weekends on [email or web address]   Patient seen and examined with the above-signed Advanced Practice Provider and/or Housestaff. I personally reviewed laboratory data, imaging studies and relevant notes. I independently examined the patient and formulated the important aspects of the plan. I have edited the note to reflect any of my changes or salient points. I have personally discussed the plan with the patient and/or family.  He remains quite tenuous. CVP  remains 11-12. 3+ edema on exam. Very weak. Very mild diuresis with IV lasix.  General:  Elderly lying flat in bed . No resp difficulty HEENT: normal Neck: supple.JVP to jaw  RIJ triple lumen Carotids 2+ bilat; no bruits. No lymphadenopathy or thryomegaly appreciated. Cor: PMI nondisplaced. irregular rate & rhythm. No rubs, gallops or murmurs. Lungs: clear Abdomen: soft, nontender, nondistended. No hepatosplenomegaly. No bruits or masses. Good bowel sounds. Extremities: no cyanosis, clubbing, rash, 3+ edema over UNNA boots  Neuro: alert &  orientedx3, cranial nerves grossly intact. moves all 4 extremities w/o difficulty. Affect pleasant  I remain quite concerned about him. Remains markedly volume overloaded with sluggish response to IV lasix. Will increase lasix and add diamox. May need pallitative care involvement.   Arvilla Meres, MD  7:14 PM

## 2020-01-12 NOTE — Progress Notes (Signed)
PT Cancellation Note  Patient Details Name: [name] MRN: [id number] DOB: 1929/04/04   Cancelled Treatment:    Reason Eval/Treat Not Completed: Patient declined, no reason specified pt politely declines PT this morning, perseverates on "its coming off, you're cutting it off". Unable to redirect. HR bounced from high 90s to as much as 123BPM just laying in bed talking to PT. Asked to call daughter, PT assisted him in this, then he said "you talk to her" even though PT did not need to speak with daughter; patient then took phone from PT and told her about how PT is "going in and cutting if off". RN alerted. Will try again later today if time/schedule allow.    Madelaine Etienne, DPT, PN1   Supplemental Physical Therapist [facility]    Pager [phone] Acute Rehab Office [phone]

## 2020-01-13 ENCOUNTER — Ambulatory Visit: Payer: Medicare HMO | Admitting: Cardiology

## 2020-01-13 LAB — CBC
HCT: 40.5 % (ref 39.0–52.0)
Hemoglobin: 13.2 g/dL (ref 13.0–17.0)
MCH: 30.2 pg (ref 26.0–34.0)
MCHC: 32.6 g/dL (ref 30.0–36.0)
MCV: 92.7 fL (ref 80.0–100.0)
Platelets: 72 10*3/uL — ABNORMAL LOW (ref 150–400)
RBC: 4.37 MIL/uL (ref 4.22–5.81)
RDW: 14.6 % (ref 11.5–15.5)
WBC: 6.5 10*3/uL (ref 4.0–10.5)
nRBC: 0 % (ref 0.0–0.2)

## 2020-01-13 LAB — BASIC METABOLIC PANEL
Anion gap: 8 (ref 5–15)
BUN: 26 mg/dL — ABNORMAL HIGH (ref 8–23)
CO2: 32 mmol/L (ref 22–32)
Calcium: 8.3 mg/dL — ABNORMAL LOW (ref 8.9–10.3)
Chloride: 95 mmol/L — ABNORMAL LOW (ref 98–111)
Creatinine, Ser: 1.02 mg/dL (ref 0.61–1.24)
GFR calc Af Amer: >60 mL/min (ref >60)
GFR calc non Af Amer: >60 mL/min (ref >60)
Glucose, Bld: 130 mg/dL — ABNORMAL HIGH (ref 70–99)
Potassium: 3.7 mmol/L (ref 3.5–5.1)
Sodium: 135 mmol/L (ref 135–145)

## 2020-01-13 LAB — URINALYSIS, ROUTINE W REFLEX MICROSCOPIC
Bilirubin Urine: NEGATIVE
Glucose, UA: NEGATIVE mg/dL
Hgb urine dipstick: NEGATIVE
Ketones, ur: NEGATIVE mg/dL
Leukocytes,Ua: NEGATIVE
Nitrite: NEGATIVE
Protein, ur: NEGATIVE mg/dL
Specific Gravity, Urine: 1.008 (ref 1.005–1.030)
pH: 9 — ABNORMAL HIGH (ref 5.0–8.0)

## 2020-01-13 LAB — COOXEMETRY PANEL
Carboxyhemoglobin: 1.4 % (ref 0.5–1.5)
Methemoglobin: 0.6 % (ref 0.0–1.5)
O2 Saturation: 66 %
Total hemoglobin: 12.8 g/dL (ref 12.0–16.0)

## 2020-01-13 LAB — AMMONIA: Ammonia: 29 umol/L (ref 9–35)

## 2020-01-13 LAB — DIGOXIN LEVEL: Digoxin Level: 0.2 ng/mL — ABNORMAL LOW (ref 0.8–2.0)

## 2020-01-13 LAB — MAGNESIUM: Magnesium: 1.6 mg/dL — ABNORMAL LOW (ref 1.7–2.4)

## 2020-01-13 MED ORDER — MAGNESIUM SULFATE 2 GM/50ML IV SOLN: 2.0000 g | Freq: Once | INTRAVENOUS | Status: DC

## 2020-01-13 MED ORDER — MAGNESIUM SULFATE 4 GM/100ML IV SOLN
4.0000 g | Freq: Once | INTRAVENOUS | Status: AC
  Administered 2020-01-13: 17:00:00 4 g via INTRAVENOUS
  Filled 2020-01-13: qty 100

## 2020-01-13 MED ORDER — MELATONIN 3 MG PO TABS
6.0000 mg | ORAL_TABLET | Freq: Every evening | ORAL | Status: DC | PRN
  Filled 2020-01-13: qty 2

## 2020-01-13 NOTE — Progress Notes (Addendum)
Patient ID: [employer], male   DOB: [birth year], 86 y.o.   MRN: [id number]     Advanced Heart Failure Rounding Note  PCP-Cardiologist: Olga Millers, MD   Subjective:     Yesterday diuresed with a total 200 mg IV lasix + diamox. Losartan was also held due hypotension. Weight unchanged.   Complaining of fatigue.   Objective:   Weight Range: 80.1 kg Body mass index is 26.85 kg/m.   Vital Signs:   Temp:  [97.7 F (36.5 C)-98.1 F (36.7 C)] 97.7 F (36.5 C) (02/03 0430) Pulse Rate:  [62-107] 62 (02/03 0430) Resp:  [15-20] 18 (02/03 0430) BP: (92-101)/(63-73) 92/73 (02/03 0430) SpO2:  [91 %-100 %] 99 % (02/03 0430) Weight:  [80.1 kg] 80.1 kg (02/03 0430) Last BM Date: 01/09/20  Weight change: Filed Weights   01/11/20 0455 01/12/20 0651 01/13/20 0430  Weight: 79.4 kg 79.9 kg 80.1 kg    Intake/Output:   Intake/Output Summary (Last 24 hours) at 01/13/2020 0858 Last data filed at 01/13/2020 0600 Gross per 24 hour  Intake 360 ml  Output 5650 ml  Net -5290 ml      Physical Exam  CVP 4 General:  Elderly. No resp difficulty. Drowsy HEENT: normal Neck: supple. no JVD. Carotids 2+ bilat; no bruits. No lymphadenopathy or thryomegaly appreciated. Cor: PMI nondisplaced. Irrregular rate & rhythm. No rubs, gallops or murmurs. Lungs: clear Abdomen: soft, nontender, nondistended. No hepatosplenomegaly. No bruits or masses. Good bowel sounds. Extremities: no cyanosis, clubbing, rash, R and LLE unna boots Neuro: alert & orientedx1 self cranial nerves grossly intact. moves all 4 extremities w/o difficulty. Affect flat    Telemetry   A fib  70-90s personally reviewed.    Labs    CBC Recent Labs    01/11/20 0607 01/12/20 0421  WBC 6.6 5.2  HGB 12.4* 12.6*  HCT 37.8* 38.2*  MCV 93.3 92.9  PLT 72* 68*   Basic Metabolic Panel Recent Labs    98/92/11 0926 01/11/20 0926 01/12/20 0421 01/13/20 0625  NA 130*   < > 135 135  K 4.0   < > 3.6 3.7  CL 88*   < >  89* 95*  CO2 32   < > 35* 32  GLUCOSE 132*   < > 130* 130*  BUN 28*   < > 27* 26*  CREATININE 0.81   < > 0.84 1.02  CALCIUM 8.3*   < > 8.4* 8.3*  MG 1.8  --  1.5*  --    < > = values in this interval not displayed.   Liver Function Tests No results for input(s): AST, ALT, ALKPHOS, BILITOT, PROT, ALBUMIN in the last 72 hours. No results for input(s): LIPASE, AMYLASE in the last 72 hours. Cardiac Enzymes No results for input(s): CKTOTAL, CKMB, CKMBINDEX, TROPONINI in the last 72 hours.  BNP: BNP (last 3 results) Recent Labs    12/30/19 0326 01/03/20 0849 01/05/20 1019  BNP 1,321.1* 1,494.6* 1,374.1*    ProBNP (last 3 results) No results for input(s): PROBNP in the last 8760 hours.   D-Dimer No results for input(s): DDIMER in the last 72 hours. Hemoglobin A1C No results for input(s): HGBA1C in the last 72 hours. Fasting Lipid Panel No results for input(s): CHOL, HDL, LDLCALC, TRIG, CHOLHDL, LDLDIRECT in the last 72 hours. Thyroid Function Tests No results for input(s): TSH, T4TOTAL, T3FREE, THYROIDAB in the last 72 hours.  Invalid input(s): FREET3  Other results:   Imaging    No results  found.   Medications:     Scheduled Medications: . allopurinol  100 mg Oral QPC breakfast  . apixaban  5 mg Oral BID  . atorvastatin  20 mg Oral QPC supper  . Chlorhexidine Gluconate Cloth  6 each Topical Q0600  . digoxin  0.125 mg Oral Daily  . docusate sodium  100 mg Oral BID  . donepezil  5 mg Oral QHS  . losartan  12.5 mg Oral Daily  . magnesium oxide  400 mg Oral Daily  . Melatonin  6 mg Oral QHS  . sodium chloride flush  10-40 mL Intracatheter Q12H  . spironolactone  12.5 mg Oral Daily  . tamsulosin  0.4 mg Oral BID PC    Infusions: . furosemide 120 mg (01/13/20 0848)    PRN Medications: acetaminophen, ipratropium-albuterol, ondansetron (ZOFRAN) IV, sodium chloride flush, zolpidem    Assessment/Plan    1. Cardiogenic shock - NICM, echo 12/12/19 EF  30-35% with mild-moderate MR and moderate RV dysfunction. (previously 40-45%) - Normal cors on cath 2006 - s/p CRT-P - dobutamine discontinued 1/31.  - CO-OX pending   - CVP down to 4. Stop diuretics.  - SBP low. Stop spiro and losartan.  - Continue digoxin, dig level 0.2   2. Acute on chronic systolic HF - plan as above  3. Permanent AF - s/p CRT-P -Continue eliquis 5 mg twice a day  - continue digoxin, dig level 0.2  - no  blocker 2/2 shock   4. AKI - due to shock - Creatinine peaked at 1.8 - Todays creatinine 1.02   5. DM2 - cover with SSI  6. Hyponatremia  - improved. Na 135  7. Dementia - Aricept  8. DNR/DNI   9. Deconditioning - PT has recommended HH PT/RN + Rolling walker w/ 5" wheels when discharged home (orders placed)  10- Lethargy -Check UTI  -Check ammonia.  -O2 sats stable.  -Check CBC, afebrile.   Consult Palliative Care for goals of care.    Length of Stay: 20  Tonye Becket, NP  01/13/2020, 8:58 AM  Advanced Heart Failure Team Pager [phone] (M-F; 7a - 4p)  Please contact [facility] for night-coverage after hours (4p -7a ) and weekends on [email or web address]  Patient seen and examined with the above-signed Advanced Practice Provider and/or Housestaff. I personally reviewed laboratory data, imaging studies and relevant notes. I independently examined the patient and formulated the important aspects of the plan. I have edited the note to reflect any of my changes or salient points. I have personally discussed the plan with the patient and/or family.  Very lethargic today. Will arouse then fall back to sleep. IV lasix increased yesterday. Only modest urine output but CVP now down to 3-4 range. LE edema much improved. Denies SOB, orthopnea or PND.   General:  Elderly fatigued appearing. No resp difficulty HEENT: normal Neck: supple. no JVD. Carotids 2+ bilat; no bruits. No lymphadenopathy or thryomegaly appreciated. Cor: PMI nondisplaced. Irregular  rate & rhythm. No rubs, gallops or murmurs. Lungs: clear Abdomen: soft, nontender, nondistended. No hepatosplenomegaly. No bruits or masses. Good bowel sounds. Extremities: no cyanosis, clubbing, rash, tr edema + unna Neuro: lethargic arousable. nonfocal   Volume status improved over past 24 hours but much more lethargic. Will check ammonia and UA. Suspect he may be nearing end-stage. Will stop lasix. I contacted his daughter and will also get Palliative Care to see to discuss GOC as I worry he may not have enough reserve to bounce back from  this hospitalization.   Glori Bickers, MD  3:57 PM

## 2020-01-13 NOTE — Progress Notes (Signed)
Physical Therapy Treatment Patient Details Name: Henry Utsey MRN: [id number] DOB: 1929-02-20 Today's Date: 01/13/2020    History of Present Illness Patient is a 86 y/o male who presents with Acute on chronic systolic CHF/nonischemic cardiomyopathy. PMh includes systolic dysfunction CHF, nonischemic cardiomyopathy, pacemaker placement, HTN, DM, HLD, frequent PVCs.    PT Comments    Patient received in bed asleep but easily woken, less confused today and pleasant, willing to work with therapy. See below for mobility/assist levels. Needed increased time and Max cues for sequencing, hand placement, and safety; also needed max verbal cues for navigation in room for transfers. Deferred gait today due to elevated HR to 120s just standing to use urinal. Had incontinence of urine in standing. He was left up in the chair with all needs met, chair alarm active and RN aware of patient status. Definitely will need someone who can provide Min-ModA at home, otherwise will need SNF.   Follow Up Recommendations  Home health PT;Supervision/Assistance - 24 hour Someone that can provide Min-ModA to patient      Equipment Recommendations  Rolling walker with 5" wheels    Recommendations for Other Services       Precautions / Restrictions Precautions Precautions: Fall Precaution Comments: watch HR Restrictions Weight Bearing Restrictions: No    Mobility  Bed Mobility Overal bed mobility: Needs Assistance Bed Mobility: Supine to Sit     Supine to sit: HOB elevated;Min assist     General bed mobility comments: increased time/effort  Transfers Overall transfer level: Needs assistance Equipment used: Rolling walker (2 wheeled) Transfers: Sit to/from [facility] Sit to Stand: Mod assist Stand pivot transfers: Min guard       General transfer comment: ModA to power up from standard height surface, then needed Max VC for navigation and sequencing for turning into  chiar with RW for pivot transfer  Ambulation/Gait             General Gait Details: deferred- elevated HR   Stairs             Wheelchair Mobility    Modified Rankin (Stroke Patients Only)       Balance Overall balance assessment: Needs assistance Sitting-balance support: No upper extremity supported;Feet supported Sitting balance-Leahy Scale: Good Sitting balance - Comments: close supervision   Standing balance support: Bilateral upper extremity supported;During functional activity Standing balance-Leahy Scale: Fair Standing balance comment: able to maintain static  standing with min guard and BUE support                            Cognition Arousal/Alertness: Awake/alert Behavior During Therapy: Flat affect Overall Cognitive Status: Impaired/Different from baseline Area of Impairment: Safety/judgement;Problem solving;Attention;Following commands                   Current Attention Level: Sustained   Following Commands: Follows one step commands with increased time;Follows one step commands inconsistently Safety/Judgement: Decreased awareness of safety;Decreased awareness of deficits Awareness: Intellectual Problem Solving: Difficulty sequencing;Requires verbal cues;Requires tactile cues General Comments: slow processing and requires increased time to follow cues. Inconsistent with cue following today, needed hand over hand placement for safety during transfers      Exercises      General Comments General comments (skin integrity, edema, etc.): urine incontinence once standing      Pertinent Vitals/Pain Pain Assessment: No/denies pain Faces Pain Scale: No hurt Pain Intervention(s): Limited activity within patient's tolerance;Monitored during session  Home Living                      Prior Function            PT Goals (current goals can now be found in the care plan section) Acute Rehab PT Goals Patient Stated  Goal: to go home PT Goal Formulation: With patient Time For Goal Achievement: 01/25/20 Potential to Achieve Goals: Good Progress towards PT goals: Progressing toward goals    Frequency    Min 3X/week      PT Plan Current plan remains appropriate    Co-evaluation              AM-PAC PT "6 Clicks" Mobility   Outcome Measure  Help needed turning from your back to your side while in a flat bed without using bedrails?: A Little Help needed moving from lying on your back to sitting on the side of a flat bed without using bedrails?: A Little Help needed moving to and from a bed to a chair (including a wheelchair)?: A Little Help needed standing up from a chair using your arms (e.g., wheelchair or bedside chair)?: A Lot Help needed to walk in hospital room?: A Little Help needed climbing 3-5 steps with a railing? : A Lot 6 Click Score: 16    End of Session Equipment Utilized During Treatment: Gait belt;Oxygen Activity Tolerance: Patient tolerated treatment well Patient left: in chair;with call bell/phone within reach;with chair alarm set Nurse Communication: Mobility status PT Visit Diagnosis: Unsteadiness on feet (R26.81);Difficulty in walking, not elsewhere classified (R26.2)     Time: 1405-1430 PT Time Calculation (min) (ACUTE ONLY): 25 min  Charges:  $Therapeutic Activity: 23-37 mins                     Windell Norfolk, DPT, PN1   Supplemental Physical Therapist [address]    Pager [phone] Acute Rehab Office [phone]

## 2020-01-14 DIAGNOSIS — Z66 Do not resuscitate: Secondary | ICD-10-CM

## 2020-01-14 DIAGNOSIS — R531 Weakness: Secondary | ICD-10-CM

## 2020-01-14 DIAGNOSIS — Z515 Encounter for palliative care: Secondary | ICD-10-CM

## 2020-01-14 LAB — CBC
HCT: 37.7 % — ABNORMAL LOW (ref 39.0–52.0)
Hemoglobin: 12.3 g/dL — ABNORMAL LOW (ref 13.0–17.0)
MCH: 30.1 pg (ref 26.0–34.0)
MCHC: 32.6 g/dL (ref 30.0–36.0)
MCV: 92.4 fL (ref 80.0–100.0)
Platelets: 67 10*3/uL — ABNORMAL LOW (ref 150–400)
RBC: 4.08 MIL/uL — ABNORMAL LOW (ref 4.22–5.81)
RDW: 14.6 % (ref 11.5–15.5)
WBC: 6.2 10*3/uL (ref 4.0–10.5)
nRBC: 0 % (ref 0.0–0.2)

## 2020-01-14 LAB — BASIC METABOLIC PANEL
Anion gap: 8 (ref 5–15)
BUN: 27 mg/dL — ABNORMAL HIGH (ref 8–23)
CO2: 30 mmol/L (ref 22–32)
Calcium: 8.4 mg/dL — ABNORMAL LOW (ref 8.9–10.3)
Chloride: 96 mmol/L — ABNORMAL LOW (ref 98–111)
Creatinine, Ser: 1.08 mg/dL (ref 0.61–1.24)
GFR calc Af Amer: >60 mL/min (ref >60)
GFR calc non Af Amer: >60 mL/min (ref >60)
Glucose, Bld: 120 mg/dL — ABNORMAL HIGH (ref 70–99)
Potassium: 3.4 mmol/L — ABNORMAL LOW (ref 3.5–5.1)
Sodium: 134 mmol/L — ABNORMAL LOW (ref 135–145)

## 2020-01-14 LAB — COOXEMETRY PANEL
Carboxyhemoglobin: 1.5 % (ref 0.5–1.5)
Methemoglobin: 0.6 % (ref 0.0–1.5)
O2 Saturation: 71.6 %
Total hemoglobin: 12.5 g/dL (ref 12.0–16.0)

## 2020-01-14 LAB — MAGNESIUM: Magnesium: 2.1 mg/dL (ref 1.7–2.4)

## 2020-01-14 MED ORDER — TORSEMIDE 20 MG PO TABS
40.0000 mg | ORAL_TABLET | Freq: Every day | ORAL | Status: DC
  Administered 2020-01-14 – 2020-01-17 (×4): 40 mg via ORAL
  Filled 2020-01-14 (×4): qty 2

## 2020-01-14 MED ORDER — POTASSIUM CHLORIDE CRYS ER 20 MEQ PO TBCR
40.0000 meq | EXTENDED_RELEASE_TABLET | Freq: Two times a day (BID) | ORAL | Status: DC
  Administered 2020-01-14 – 2020-01-16 (×5): 40 meq via ORAL
  Filled 2020-01-14 (×6): qty 2

## 2020-01-14 MED ORDER — DIGOXIN 125 MCG PO TABS
0.0625 mg | ORAL_TABLET | Freq: Every day | ORAL | Status: DC
  Administered 2020-01-14 – 2020-01-17 (×4): 0.0625 mg via ORAL
  Filled 2020-01-14 (×4): qty 1

## 2020-01-14 MED ORDER — AMIODARONE HCL 200 MG PO TABS
200.0000 mg | ORAL_TABLET | Freq: Two times a day (BID) | ORAL | Status: AC
  Administered 2020-01-14 – 2020-01-17 (×7): 200 mg via ORAL
  Filled 2020-01-14 (×7): qty 1

## 2020-01-14 MED ORDER — AMIODARONE HCL 200 MG PO TABS: 200.0000 mg | ORAL_TABLET | Freq: Every day | ORAL | Status: DC

## 2020-01-14 MED ORDER — DIGOXIN 125 MCG PO TABS: 0.0625 mg | ORAL_TABLET | Freq: Every day | ORAL | Status: DC

## 2020-01-14 NOTE — Progress Notes (Signed)
CARDIAC REHAB PHASE I   PRE:  Rate/Rhythm: 111 pacing/PVCs    BP: sitting 110/63    SaO2: 98 2L  MODE:  Ambulation: 80 ft   POST:  Rate/Rhythm: 103 pacing with PVCs    BP: sitting 97/61     SaO2: 93 2L, up to 99 2L  Pt with flat affect on arrival "spaced out" but able to converse appropriately. Sts "I need to ambulate today". Moved to EOB slowly but independently. We raised bed and he was able to stand independently. Used RW, gait belt, assist x2 for equipment, 2L O2. Pt slow and steady with RW. No major c/o. To recliner. Feeling fairly well. Will f/u tomorrow. PT today. [phone]  Harriet Masson CES, ACSM 01/14/2020 11:12 AM

## 2020-01-14 NOTE — Consult Note (Signed)
Consultation Note Date: 01/14/2020   Patient Name: [name]  DOB: 1929-03-07  MRN: [id number]  Age / Sex: 86 y.o., male  PCP: Wynn Banker, MD Referring Physician: Little Ishikawa  Reason for Consultation: Establishing goals of care and Psychosocial/spiritual support  HPI/Patient Profile: 86 y.o. male  admitted on 12/23/2019 with a significant past medical historyof  chronic systolic CHF/non-ischemic cardiomyopathy with EF of 30-35% on echo in 12/2019, s/p BiV ICD placement in 10/2012 which was downgraded to BiV PPM in 07/2016, permanent atrial fibrillation on Eliquis, hypertension, hyperlipidemia, type 2 diabetes mellitus, BPH.   Patient's long-term prognosis is limited secondary to his multiple comorbidities; nonischemic cardiomyopathy, acute on chronic heart failure, permanent A. Fib, acute kidney injury, diabetes, frailty and dementia.  According to daughter patient has had continued physical and functional decline over the last several months.  This is the second hospitalization in the.  Patient did have an attempt at rehab within the last few months, without success.  Today is day 21 of this hospitalization.  He remains weak, is intermittently confused and is maximum assist for activities of daily living.  Patient's daughter [name] is his only family, and support Conservation officer, nature.  She understands that her father is failing to thrive and faces treatment option decisions, advanced directive decisions and anticipatory care needs.    Clinical Assessment and Goals of Care:   This NP Lorinda Creed reviewed medical records, received report from team, assessed the patient and then meet at the patient's bedside along with his daughter  to discuss diagnosis, prognosis, GOC, EOL wishes disposition and options.  Concept of Hospice and Palliative Care were discussed  A detailed  discussion was had today regarding advanced directives.  Concepts specific to code status, artifical feeding and hydration, continued IV antibiotics and rehospitalization was had.  The difference between a aggressive medical intervention path  and a palliative comfort care path for this patient at this time was had.  Values and goals of care important to patient and family were attempted to be elicited.  MOST form introduced and a  Hard Choices booklet is left for review  Natural trajectory and expectations at EOL were discussed.  Questions and concerns addressed.   Family encouraged to call with questions or concerns.    PMT will continue to support holistically.        SUMMARY OF RECOMMENDATIONS    Code Status/Advance Care Planning:  DNR-documented today   Palliative Prophylaxis:   Bowel Regimen, Delirium Protocol, Frequent Pain Assessment and Oral Care  Additional Recommendations (Limitations, Scope, Preferences):  Avoid Hospitalization and No Artificial Feeding  Psycho-social/Spiritual:   Desire for further Chaplaincy support:no  Additional Recommendations: Education on Hospice and Grief/Bereavement Support  Prognosis:   < 6 months  Discharge Planning: Daughter is struggling with her options for disposition for her father.  Her hope is to attempt to take him home with hospice and "see how it goes.  She is a Lawyer.  She hopes with the assistance of hospice she can keep  him at home and comfortable at this time in his life.  She would be open to residential hospice when patient is eligible.  She is requesting hospice of the [facility] with Hospice      Primary Diagnoses: Present on Admission: . Edema . Dyslipidemia . DM (diabetes mellitus) type II controlled with renal manifestation ([place]) . Automatic implantable cardioverter-defibrillator in situ . Atrial fibrillation ([place]) . Essential hypertension   I have reviewed the medical record, interviewed the  patient and family, and examined the patient. The following aspects are pertinent.  Past Medical History:  Diagnosis Date  . Anemia   . Atrial fibrillation ([place])   . BPH (benign prostatic hyperplasia)   . Bradycardia   . Cardiomyopathy   . CHF (congestive heart failure) ([place])   . Diabetes mellitus type II   . GERD (gastroesophageal reflux disease)   . History of colonoscopy   . HTN (hypertension)   . Hyperlipidemia   . ICD (implantable cardiac defibrillator) in place 10/14/2012   biventricular  . OA (osteoarthritis)   . Obesity   . Peptic ulcer    Social History   Socioeconomic History  . Marital status: Widowed    Spouse name: Not on file  . Number of children: Not on file  . Years of education: Not on file  . Highest education level: Not on file  Occupational History  . Occupation: Retired  Tobacco Use  . Smoking status: Never Smoker  . Smokeless tobacco: Never Used  Substance and Sexual Activity  . Alcohol use: No  . Drug use: No  . Sexual activity: Not on file  Other Topics Concern  . Not on file  Social History Narrative  . Not on file   Social Determinants of Health   Financial Resource Strain:   . Difficulty of Paying Living Expenses: Not on file  Food Insecurity:   . Worried About Charity fundraiser in the Last Year: Not on file  . Ran Out of Food in the Last Year: Not on file  Transportation Needs:   . Lack of Transportation (Medical): Not on file  . Lack of Transportation (Non-Medical): Not on file  Physical Activity:   . Days of Exercise per Week: Not on file  . Minutes of Exercise per Session: Not on file  Stress:   . Feeling of Stress : Not on file  Social Connections:   . Frequency of Communication with Friends and Family: Not on file  . Frequency of Social Gatherings with Friends and Family: Not on file  . Attends Religious Services: Not on file  . Active Member of Clubs or Organizations: Not on file  . Attends Archivist  Meetings: Not on file  . Marital Status: Not on file   Family History  Problem Relation Age of Onset  . Heart disease Father   . Heart disease Mother   . Diabetes Mother   . Hypertension Brother   . Hypertension Brother   . Cancer Neg Hx    Scheduled Meds: . allopurinol  100 mg Oral QPC breakfast  . amiodarone  200 mg Oral BID  . [START ON 01/21/2020] amiodarone  200 mg Oral Daily  . apixaban  5 mg Oral BID  . atorvastatin  20 mg Oral QPC supper  . Chlorhexidine Gluconate Cloth  6 each Topical Q0600  . digoxin  0.0625 mg Oral Daily  . docusate sodium  100 mg Oral BID  . donepezil  5 mg Oral QHS  . magnesium oxide  400 mg Oral Daily  . potassium chloride  40 mEq Oral BID  . sodium chloride flush  10-40 mL Intracatheter Q12H  . tamsulosin  0.4 mg Oral BID PC  . torsemide  40 mg Oral Daily   Continuous Infusions: PRN Meds:.acetaminophen, ipratropium-albuterol, Melatonin, ondansetron (ZOFRAN) IV, sodium chloride flush Medications Prior to Admission:  Prior to Admission medications   Medication Sig Start Date End Date Taking? Authorizing Provider  acetaminophen (TYLENOL) 650 MG CR tablet Take 1,300 mg by mouth at bedtime as needed for pain.    Yes [provider]  allopurinol (ZYLOPRIM) 100 MG tablet TAKE 1 TABLET EVERY DAY Patient taking differently: Take 100 mg by mouth daily after breakfast.  10/27/19  Yes Koberlein, Junell C, MD  apixaban (ELIQUIS) 5 MG TABS tablet TAKE 1 TABLET(5 MG) BY MOUTH TWICE DAILY Patient taking differently: Take 5 mg by mouth 2 (two) times daily after a meal.  09/02/19  Yes Koberlein, Junell C, MD  atorvastatin (LIPITOR) 20 MG tablet TAKE 1 TABLET EVERY DAY Patient taking differently: Take 20 mg by mouth daily after supper.  08/25/19  Yes Lewayne Bunting, MD  cholecalciferol (VITAMIN D) 1000 units tablet Take 1,000 Units by mouth daily after breakfast.    Yes [provider]  Docusate Calcium (STOOL SOFTENER PO) Take 2 capsules by  mouth 2 (two) times daily.    Yes [provider]  donepezil (ARICEPT) 5 MG tablet TAKE 1 TABLET  BY MOUTH AT BEDTIME. Patient taking differently: Take 5 mg by mouth at bedtime.  12/07/19  Yes Koberlein, Junell C, MD  furosemide (LASIX) 40 MG tablet Take 2 tablets (80 mg total) by mouth daily. 12/19/19  Yes Zannie Cove, MD  magnesium oxide (MAG-OX) 400 (241.3 Mg) MG tablet TAKE 1 TABLET (400 MG TOTAL) BY MOUTH 2 (TWO) TIMES DAILY. Patient taking differently: Take 400 mg by mouth See admin instructions. Take one tablet (400 mg) by mouth twice daily - after breakfast and at 2pm 08/26/18  Yes Crenshaw, Madolyn Frieze, MD  MELATONIN ER PO Take 1 tablet by mouth at bedtime.   Yes [provider]  tamsulosin (FLOMAX) 0.4 MG CAPS capsule TAKE 1 CAPSULE TWICE DAILY Patient taking differently: Take 0.4 mg by mouth 2 (two) times daily after a meal.  11/20/19  Yes Koberlein, Junell C, MD   No Known Allergies Review of Systems  Physical Exam  Vital Signs: BP (!) 110/56 (BP Location: Left Arm)   Pulse 100   Temp (!) 97.4 F (36.3 C) (Oral)   Resp 15   Ht 5\' 8"  (1.727 m)   Wt 91.4 kg   SpO2 99%   BMI 30.64 kg/m  Pain Scale: 0-10   Pain Score: 0-No pain   SpO2: SpO2: 99 % O2 Device:SpO2: 99 % O2 Flow Rate: .O2 Flow Rate (L/min): 2 L/min  IO: Intake/output summary:   Intake/Output Summary (Last 24 hours) at 01/14/2020 1519 Last data filed at 01/14/2020 03/13/2020 Gross per 24 hour  Intake 640 ml  Output 1450 ml  Net -810 ml    LBM: Last BM Date: 01/13/20 Baseline Weight: Weight: 102.1 kg Most recent weight: Weight: 91.4 kg     Palliative Assessment/Data:   Discussed with   Time In: 1500 Time Out: 1615 Time Total: 75 minutes Greater than 50%  of this time was spent counseling and coordinating care related to the above assessment and plan.  Signed by: 1616,  NP   Please contact Palliative Medicine Team phone at [phone] for questions and concerns.  For individual  provider: See Loretha Stapler

## 2020-01-14 NOTE — Progress Notes (Addendum)
Patient ID: [employer], male   DOB: August 13, 1929, 86 y.o.   MRN: [id number]     Advanced Heart Failure Rounding Note  PCP-Cardiologist: Kirk Ruths, MD   Subjective:    Lasix discontinued yesterday. -2.5L out. Doubt wt accurate (documented change +25lb). CVP 6. Scr 1.08. K 3.4.   Losartan and spironolactone discontinued due to hypotension. SBP 110 this am.   Co-ox 72%.   Noted to be lethargic yesterday. Ammonia level normal. UA negative. Appears more alert and oriented today. Able to state name, DOB and age. Oriented to place. Stated he is at [facility].     Objective:   Weight Range: 91.4 kg Body mass index is 30.64 kg/m.   Vital Signs:   Temp:  [97.4 F (36.3 C)-97.6 F (36.4 C)] 97.4 F (36.3 C) (02/04 0547) Pulse Rate:  [74-96] 76 (02/04 0547) Resp:  [15] 15 (02/03 0900) BP: (73-110)/(52-67) 110/56 (02/04 0547) SpO2:  [99 %-100 %] 99 % (02/04 0547) Weight:  [91.4 kg] 91.4 kg (02/04 0617) Last BM Date: 01/12/20  Weight change: Filed Weights   01/12/20 0651 01/13/20 0430 01/14/20 0617  Weight: 79.9 kg 80.1 kg 91.4 kg    Intake/Output:   Intake/Output Summary (Last 24 hours) at 01/14/2020 0713 Last data filed at 01/14/2020 0600 Gross per 24 hour  Intake 940 ml  Output 2450 ml  Net -1510 ml      Physical Exam   PHYSICAL EXAM: CVP 6 General:  Elderly WM, fatigue appearing. No respiratory difficulty HEENT: normal  Neck: supple. no JVD. Rt IJ CVC. Carotids 2+ bilat; no bruits. No lymphadenopathy or thyromegaly appreciated. Cor: PMI nondisplaced. Irregularly irregular rhythm, regular rate. No rubs, gallops or murmurs. Lungs: clear Abdomen: soft, nontender, nondistended. No hepatosplenomegaly. No bruits or masses. Good bowel sounds. Extremities: no cyanosis, clubbing, rash, bilateral unna boots w/ 1+ edema around knees.  Neuro: alert & oriented x 3, cranial nerves grossly intact. moves all 4 extremities w/o difficulty. Affect  pleasant.   Telemetry   Afib w/ frequent PVCs,  Low 100s personally reviewed.    Labs    CBC Recent Labs    01/13/20 1325 01/14/20 0543  WBC 6.5 6.2  HGB 13.2 12.3*  HCT 40.5 37.7*  MCV 92.7 92.4  PLT 72* 67*   Basic Metabolic Panel Recent Labs    01/12/20 0421 01/13/20 0500 01/13/20 0625 01/14/20 0543  NA   < >  --  135 134*  K   < >  --  3.7 3.4*  CL   < >  --  95* 96*  CO2   < >  --  32 30  GLUCOSE   < >  --  130* 120*  BUN   < >  --  26* 27*  CREATININE   < >  --  1.02 1.08  CALCIUM   < >  --  8.3* 8.4*  MG  --  1.6*  --  2.1   < > = values in this interval not displayed.   Liver Function Tests No results for input(s): AST, ALT, ALKPHOS, BILITOT, PROT, ALBUMIN in the last 72 hours. No results for input(s): LIPASE, AMYLASE in the last 72 hours. Cardiac Enzymes No results for input(s): CKTOTAL, CKMB, CKMBINDEX, TROPONINI in the last 72 hours.  BNP: BNP (last 3 results) Recent Labs    12/30/19 0326 01/03/20 0849 01/05/20 1019  BNP 1,321.1* 1,494.6* 1,374.1*    ProBNP (last 3 results) No results for input(s): PROBNP  in the last 8760 hours.   D-Dimer No results for input(s): DDIMER in the last 72 hours. Hemoglobin A1C No results for input(s): HGBA1C in the last 72 hours. Fasting Lipid Panel No results for input(s): CHOL, HDL, LDLCALC, TRIG, CHOLHDL, LDLDIRECT in the last 72 hours. Thyroid Function Tests No results for input(s): TSH, T4TOTAL, T3FREE, THYROIDAB in the last 72 hours.  Invalid input(s): FREET3  Other results:   Imaging    No results found.   Medications:     Scheduled Medications: . allopurinol  100 mg Oral QPC breakfast  . apixaban  5 mg Oral BID  . atorvastatin  20 mg Oral QPC supper  . Chlorhexidine Gluconate Cloth  6 each Topical Q0600  . digoxin  0.125 mg Oral Daily  . docusate sodium  100 mg Oral BID  . donepezil  5 mg Oral QHS  . magnesium oxide  400 mg Oral Daily  . sodium chloride flush  10-40 mL  Intracatheter Q12H  . tamsulosin  0.4 mg Oral BID PC    Infusions:   PRN Medications: acetaminophen, ipratropium-albuterol, Melatonin, ondansetron (ZOFRAN) IV, sodium chloride flush    Assessment/Plan    1. Cardiogenic shock - NICM, echo 12/12/19 EF 30-35% with mild-moderate MR and moderate RV dysfunction. (previously 40-45%) - Normal cors on cath 2006 - s/p CRT-P - dobutamine discontinued 1/31.  - CO-OX 72%   - CVP 6. Diuretics on hold.  - losartan and spiro held for hypotension. BP improved.  - no  blocker w/ shock - Continue digoxin, dig level 0.2 (2/3)  2. Acute on chronic systolic HF - plan as above - suspect nearing end stage. Palliative care consult placed to discuss GOC  3. Permanent AF - s/p CRT-P -Continue eliquis 5 mg twice a day  - continue digoxin, dig level ok, 0.2  - no  blocker 2/2 shock   4. AKI - due to shock - Creatinine peaked at 1.8 but improved - Todays creatinine 1.08   5. DM2 - controlled. Hgb A1c 7.0  6. Hyponatremia  - Na as low at 127 this admit - 134 today   7. Dementia - Aricept  8. DNR/DNI   9. Deconditioning - PT has recommended HH PT/RN + Rolling walker w/ 5" wheels when discharged home (orders placed)  10- Lethargy  -less lethargic today  -UA negative 2/3  -Ammonia level normal -Na 134  -O2 sats stable.  -Afebrile w/ normal WBC ct.    Length of Stay: [address] PA-C  01/14/2020, 7:13 AM  Advanced Heart Failure Team Pager [phone] (M-F; 7a - 4p)  Please contact [facility] for night-coverage after hours (4p -7a ) and weekends on [email or web address]   Patient seen and examined with the above-signed Advanced Practice Provider and/or Housestaff. I personally reviewed laboratory data, imaging studies and relevant notes. I independently examined the patient and formulated the important aspects of the plan. I have edited the note to reflect any of my changes or salient points. I have personally discussed the  plan with the patient and/or family.  More alert today but still weak. CVP 4-5. Co-ox ok. UA and ammonia are normal. Having frequent PVCs on monitor. Denies SOB, orthopnea or PND.  Restart low-dose po torsemide. Low-dose amio for PVC suppression. Supp K and Mag  Await Palliative Care meeting for GOC to help decide Dispo. If he goes home will need full-time care.Consider Home Hospice versus SNF.   Arvilla Meres, MD  9:19 AM

## 2020-01-15 ENCOUNTER — Ambulatory Visit: Payer: Medicare HMO | Admitting: Family Medicine

## 2020-01-15 DIAGNOSIS — Z515 Encounter for palliative care: Secondary | ICD-10-CM

## 2020-01-15 DIAGNOSIS — Z66 Do not resuscitate: Secondary | ICD-10-CM

## 2020-01-15 DIAGNOSIS — R531 Weakness: Secondary | ICD-10-CM

## 2020-01-15 LAB — BASIC METABOLIC PANEL
Anion gap: 6 (ref 5–15)
BUN: 31 mg/dL — ABNORMAL HIGH (ref 8–23)
CO2: 30 mmol/L (ref 22–32)
Calcium: 8.4 mg/dL — ABNORMAL LOW (ref 8.9–10.3)
Chloride: 96 mmol/L — ABNORMAL LOW (ref 98–111)
Creatinine, Ser: 0.99 mg/dL (ref 0.61–1.24)
GFR calc Af Amer: >60 mL/min (ref >60)
GFR calc non Af Amer: >60 mL/min (ref >60)
Glucose, Bld: 127 mg/dL — ABNORMAL HIGH (ref 70–99)
Potassium: 3.9 mmol/L (ref 3.5–5.1)
Sodium: 132 mmol/L — ABNORMAL LOW (ref 135–145)

## 2020-01-15 LAB — CBC
HCT: 37.7 % — ABNORMAL LOW (ref 39.0–52.0)
Hemoglobin: 12.5 g/dL — ABNORMAL LOW (ref 13.0–17.0)
MCH: 31 pg (ref 26.0–34.0)
MCHC: 33.2 g/dL (ref 30.0–36.0)
MCV: 93.5 fL (ref 80.0–100.0)
Platelets: 66 10*3/uL — ABNORMAL LOW (ref 150–400)
RBC: 4.03 MIL/uL — ABNORMAL LOW (ref 4.22–5.81)
RDW: 14.7 % (ref 11.5–15.5)
WBC: 6 10*3/uL (ref 4.0–10.5)
nRBC: 0 % (ref 0.0–0.2)

## 2020-01-15 LAB — COOXEMETRY PANEL
Carboxyhemoglobin: 1.8 % — ABNORMAL HIGH (ref 0.5–1.5)
Methemoglobin: 1 % (ref 0.0–1.5)
O2 Saturation: 68.3 %
Total hemoglobin: 12.6 g/dL (ref 12.0–16.0)

## 2020-01-15 MED ORDER — DIGOXIN 62.5 MCG PO TABS: 0.0625 mg | ORAL_TABLET | Freq: Every day | ORAL | 0 refills | Status: AC

## 2020-01-15 MED ORDER — TORSEMIDE 20 MG PO TABS: 40.0000 mg | ORAL_TABLET | Freq: Every day | ORAL | 3 refills | Status: AC

## 2020-01-15 MED ORDER — AMIODARONE HCL 200 MG PO TABS: 200.0000 mg | ORAL_TABLET | Freq: Every day | ORAL | 3 refills | Status: DC

## 2020-01-15 NOTE — Progress Notes (Signed)
Physical Therapy Treatment Patient Details Name: [name] MRN: [id number] DOB: [birth year] Today's Date: 01/15/2020    History of Present Illness Patient is a 86 y/o male who presents with Acute on chronic systolic CHF/nonischemic cardiomyopathy. PMh includes systolic dysfunction CHF, nonischemic cardiomyopathy, pacemaker placement, HTN, DM, HLD, frequent PVCs.    PT Comments    Patient received in bed, easily woken and willing to participate in session. See below for mobility/assist levels. Mobility much improved overall today and tolerated slight improvement in gait distance; did well with gait on room air, maintained at 97-98% on room air with activity. Somewhat impulsive and tends to veer left and right with gait but able to maintain balance and safety with min guard/RW. He was left up in the chair with all needs met, chair alarm active and RN aware of patient status.     Follow Up Recommendations  Home health PT;Supervision/Assistance - 24 hour     Equipment Recommendations  Rolling walker with 5" wheels    Recommendations for Other Services       Precautions / Restrictions Precautions Precautions: Fall Precaution Comments: watch HR Restrictions Weight Bearing Restrictions: No    Mobility  Bed Mobility Overal bed mobility: Needs Assistance Bed Mobility: Supine to Sit     Supine to sit: Min guard;HOB elevated     General bed mobility comments: increased time/effort  Transfers Overall transfer level: Needs assistance Equipment used: Rolling walker (2 wheeled) Transfers: Sit to/from Stand Sit to Stand: Min guard;From elevated surface         General transfer comment: bed slightly elevated, cues for hand placement but able to pop right up to standing with light min guard  Ambulation/Gait Ambulation/Gait assistance: Min guard Gait Distance (Feet): 90 Feet Assistive device: Rolling walker (2 wheeled) Gait Pattern/deviations: Step-through  pattern;Trunk flexed;Decreased step length - right;Decreased step length - left;Decreased stride length;Drifts right/left Gait velocity: decreased   General Gait Details: mildly impulsive when walking in hallway, needed close min guard and cues to maintain appropriate distance from RW; VSS on RA and SPO2 at 97-98% with activity on room air   Stairs             Wheelchair Mobility    Modified Rankin (Stroke Patients Only)       Balance Overall balance assessment: Needs assistance Sitting-balance support: No upper extremity supported;Feet supported Sitting balance-Leahy Scale: Good Sitting balance - Comments: close supervision   Standing balance support: Bilateral upper extremity supported;During functional activity Standing balance-Leahy Scale: Fair Standing balance comment: able to maintain static  standing with min guard and BUE support                            Cognition Arousal/Alertness: Awake/alert Behavior During Therapy: Flat affect Overall Cognitive Status: Impaired/Different from baseline Area of Impairment: Safety/judgement;Problem solving;Attention;Following commands                   Current Attention Level: Sustained   Following Commands: Follows one step commands inconsistently;Follows one step commands consistently Safety/Judgement: Decreased awareness of safety;Decreased awareness of deficits Awareness: Intellectual Problem Solving: Difficulty sequencing;Requires verbal cues;Requires tactile cues General Comments: much more alert and participatory today, more consistent with cue following. Somewhat impulsive in the hallway and did need cues for safety      Exercises      General Comments        Pertinent Vitals/Pain Pain Assessment: No/denies pain Faces Pain Scale: No  hurt Pain Intervention(s): Limited activity within patient's tolerance;Monitored during session    Home Living                      Prior Function             PT Goals (current goals can now be found in the care plan section) Acute Rehab PT Goals Patient Stated Goal: to go home PT Goal Formulation: With patient Time For Goal Achievement: 01/25/20 Potential to Achieve Goals: Good Progress towards PT goals: Progressing toward goals    Frequency    Min 3X/week      PT Plan Current plan remains appropriate    Co-evaluation              AM-PAC PT "6 Clicks" Mobility   Outcome Measure  Help needed turning from your back to your side while in a flat bed without using bedrails?: A Little Help needed moving from lying on your back to sitting on the side of a flat bed without using bedrails?: A Little Help needed moving to and from a bed to a chair (including a wheelchair)?: A Little Help needed standing up from a chair using your arms (e.g., wheelchair or bedside chair)?: A Little Help needed to walk in hospital room?: A Little Help needed climbing 3-5 steps with a railing? : A Lot 6 Click Score: 17    End of Session Equipment Utilized During Treatment: Gait belt Activity Tolerance: Patient tolerated treatment well Patient left: in chair;with call bell/phone within reach;with chair alarm set Nurse Communication: Mobility status;Other (comment)(sats on room air) PT Visit Diagnosis: Unsteadiness on feet (R26.81);Difficulty in walking, not elsewhere classified (R26.2)     Time: 7017-7939 PT Time Calculation (min) (ACUTE ONLY): 17 min  Charges:  $Gait Training: 8-22 mins                     Windell Norfolk, DPT, PN1   Supplemental Physical Therapist [place]    Pager [phone] Acute Rehab Office [phone]

## 2020-01-15 NOTE — Discharge Planning (Addendum)
Advanced Heart Failure Team  Discharge Summary   Patient ID: [name] MRN: [id number], DOB/AGE: [birth year] 86 y.o. Admit date: 12/23/2019 D/C date:     01/17/2020   Primary Discharge Diagnoses:  Acute on Chronic Systolic Heart Failure / Cardiogenic Shock AKI Permanent Atrial Fibrillation/ PVCs Type 2 Diabetes Hypotension  Hyponatremia Dementia  Deconditioning   Hospital Course:  86 y/o male with h/o DM2, permanent AF, HTN and chronic systolic HF due to NICM - previous EF 40-45% in 2018. Has h/o CRT-D which was previously changed to CRT-P  Admitted 12/11/19 - 12/18/19 with worsening HF. Echo showed drop in EF to ~25% with moderate MR and at least moderate RV dysfunction. Underwent IV diuresis and d/c'd home. Carvedilol added.  GOC conversation at that time with his daughter no intubation but ok with CPR/shock. D/c weight 223.   Re-admitted on 12/24/19 with recurrent HF and volume overload. Initially seemed to improved with diuresis weight 225 -> 215 on 1/20. However developed progressive wt gain, up to 231 lb and became nonresponsive to IV lasix. Became markedly volume overloaded w/ evidence of low output.   AHF consulted for further management.  After discussion w/ patient and family, decision was made to proceed w/ trial of aggressive therapy w/ central line placement and treatment w/ inotrope, but no other aggressive measures. CODE status changed to DNR. Central line placed and started on Dobutamine and continued on IV Lasix. Overall diuresed 13 L with close to 30 lb wt decrease. Dobutamine discontinued on 1/31. Co-ox off dobutamine 68%. Transitioned back to PO diuretics, torsemide 40 mg daily. BP did not allow aggressive treatment w/ GDMT. BP too soft for ARB/ARNI and spironolactone. No  blocker due to shock. Digoxin added.   He remained weak and intermittently confused. Per family, he has had continued physical and functional decline over the last several months. Overall  prognosis felt to be poor and not a candidate for advanced HF therapies. Palliative care consulted to discuss goals of care. Pt and family agreed to home hospice care.   Home Hospice Arranged including [facility] PT/RN with rolling walker w/ 5" wheels (order placed). Patient seen and examined by Dr. Gala Romney and felt stable for discharge home.   Discharge Weight Range: 91.8kg  Discharge Vitals: Blood pressure 119/88, pulse 93, temperature (!) 97.4 F (36.3 C), temperature source Oral, resp. rate 18, height 5\' 8"  (1.727 m), weight 92.1 kg, SpO2 100 %.  Labs: Lab Results  Component Value Date   WBC 5.3 01/17/2020   HGB 12.1 (L) 01/17/2020   HCT 36.4 (L) 01/17/2020   MCV 91.2 01/17/2020   PLT 74 (L) 01/17/2020    Recent Labs  Lab 01/16/20 0644  NA 131*  K 4.2  CL 96*  CO2 27  BUN 30*  CREATININE 1.22  CALCIUM 8.4*  GLUCOSE 129*   Lab Results  Component Value Date   CHOL 163 06/16/2019   HDL 74.60 06/16/2019   LDLCALC 78 06/16/2019   TRIG 54.0 06/16/2019   BNP (last 3 results) Recent Labs    12/30/19 0326 01/03/20 0849 01/05/20 1019  BNP 1,321.1* 1,494.6* 1,374.1*    ProBNP (last 3 results) No results for input(s): PROBNP in the last 8760 hours.   Diagnostic Studies/Procedures   No results found.  Discharge Medications   Allergies as of 01/17/2020   No Known Allergies     Medication List    STOP taking these medications   furosemide 40 MG tablet Commonly known as: LASIX  TAKE these medications   acetaminophen 650 MG CR tablet Commonly known as: TYLENOL Take 1,300 mg by mouth at bedtime as needed for pain.   allopurinol 100 MG tablet Commonly known as: ZYLOPRIM TAKE 1 TABLET EVERY DAY What changed: when to take this   amiodarone 200 MG tablet Commonly known as: PACERONE Take 1 tablet (200 mg total) by mouth 2 (two) times daily for 7 doses.   amiodarone 200 MG tablet Commonly known as: PACERONE Take 1 tablet (200 mg total) by mouth daily. Start  taking on: January 21, 2020   apixaban 5 MG Tabs tablet Commonly known as: Eliquis TAKE 1 TABLET(5 MG) BY MOUTH TWICE DAILY What changed:   how much to take  how to take this  when to take this  additional instructions   atorvastatin 20 MG tablet Commonly known as: LIPITOR TAKE 1 TABLET EVERY DAY What changed: when to take this   cholecalciferol 1000 units tablet Commonly known as: VITAMIN D Take 1,000 Units by mouth daily after breakfast.   Digoxin 62.5 MCG Tabs Take 0.0625 mg by mouth daily.   donepezil 5 MG tablet Commonly known as: ARICEPT TAKE 1 TABLET  BY MOUTH AT BEDTIME.   magnesium oxide 400 (241.3 Mg) MG tablet Commonly known as: MAG-OX TAKE 1 TABLET (400 MG TOTAL) BY MOUTH 2 (TWO) TIMES DAILY. What changed:   when to take this  additional instructions   MELATONIN ER PO Take 1 tablet by mouth at bedtime.   STOOL SOFTENER PO Take 2 capsules by mouth 2 (two) times daily.   tamsulosin 0.4 MG Caps capsule Commonly known as: FLOMAX TAKE 1 CAPSULE TWICE DAILY What changed: when to take this   torsemide 20 MG tablet Commonly known as: DEMADEX Take 2 tablets (40 mg total) by mouth daily.            Durable Medical Equipment  (From admission, onward)         Start     Ordered   01/12/20 1050  For home use only DME Walker rolling  Once    Question Answer Comment  Walker: With 5 Inch Wheels   Patient needs a walker to treat with the following condition Heart failure (HCC)   Patient needs a walker to treat with the following condition Physical deconditioning      01/12/20 1050   01/11/20 1207  Heart failure home health orders  (Heart failure home health orders / Face to face)  Once    Comments: Heart Failure Follow-up Care:  Verify follow-up appointments per Patient Discharge Instructions. Confirm transportation arranged. Reconcile home medications with discharge medication list. Remove discontinued medications from use. Assist  patient/caregiver to manage medications using pill box. Reinforce low sodium food selection Assessments: Vital signs and oxygen saturation at each visit. Assess home environment for safety concerns, caregiver support and availability of low-sodium foods. Consult Child psychotherapist, PT/OT, Dietitian, and CNA based on assessments. Perform comprehensive cardiopulmonary assessment. Notify MD for any change in condition or weight gain of 3 pounds in one day or 5 pounds in one week with symptoms. Daily Weights and Symptom Monitoring: Ensure patient has access to scales. Teach patient/caregiver to weigh daily before breakfast and after voiding using same scale and record.    Teach patient/caregiver to track weight and symptoms and when to notify Provider. Activity: Develop individualized activity plan with patient/caregiver.  HHHRN HHPT.  Question Answer Comment  Heart Failure Follow-up Care Advanced Heart Failure (AHF) Clinic at [phone]  Lab frequency Other see comments   Fax lab results to AHF Clinic at [phone]   Diet Low Sodium Heart Healthy   Fluid restrictions: 2000 mL Fluid   Initiate Heart Failure Clinic Diuretic Protocol to be used by [place] only ( to be ordered by Heart Failure Team Providers Only) No      01/11/20 1208          Disposition   The patient will be discharged in stable condition to home. Discharge Instructions    Call MD for:  difficulty breathing, headache or visual disturbances   Complete by: As directed    Call MD for:  extreme fatigue   Complete by: As directed    Call MD for:  hives   Complete by: As directed    Call MD for:  persistant dizziness or light-headedness   Complete by: As directed    Call MD for:  persistant nausea and vomiting   Complete by: As directed    Call MD for:  redness, tenderness, or signs of infection (pain, swelling, redness, odor or green/yellow discharge around incision site)   Complete by: As  directed    Call MD for:  severe uncontrolled pain   Complete by: As directed    Call MD for:  temperature >100.4   Complete by: As directed    Diet - low sodium heart healthy   Complete by: As directed    Face-to-face encounter (required for Medicare/Medicaid patients)   Complete by: As directed    I Lyda Jester certify that this patient is under my care and that I, or a nurse practitioner or physician's assistant working with me, had a face-to-face encounter that meets the physician face-to-face encounter requirements with this patient on 01/12/2020. The encounter with the patient was in whole, or in part for the following medical condition(s) which is the primary reason for home health care (List medical condition): chronic systolic heart failure   The encounter with the patient was in whole, or in part, for the following medical condition, which is the primary reason for home health care: chronic systolic heart failure   I certify that, based on my findings, the following services are medically necessary home health services:  Nursing Physical therapy     Reason for Medically Necessary Home Health Services: Skilled Nursing- Change/Decline in Patient Status   My clinical findings support the need for the above services: Cognitive impairments, dementia, or mental confusion  that make it unsafe to leave home   Further, I certify that my clinical findings support that this patient is homebound due to: Unable to leave home safely without assistance   Home Health   Complete by: As directed    To provide the following care/treatments:  PT RN     Increase activity slowly   Complete by: As directed    Walker rolling   Complete by: As directed    5" wheels     Follow-up Information    [facility].   Why: Referral made for Home Hospice needs and DME Contact information: [address] [place] [address] [phone]        Lelon Perla, MD  Follow up on 02/01/2020.   Specialty: Cardiology Why: Your follow up appointment will be on 02/01/20 at 240pm with Dr. [name].  Contact information: [address] [place]  [phone]  Duration of Discharge Encounter: Greater than 35 minutes   Signed, Georgie Chard PA-C 01/17/2020, 1:32 PM   Patient seen and examined with the above-signed Advanced Practice Provider and/or Housestaff. I personally reviewed laboratory data, imaging studies and relevant notes. I independently examined the patient and formulated the important aspects of the plan. I have edited the note to reflect any of my changes or salient points. I have personally discussed the plan with the patient and/or family.  He looks good. Ready for d/c today with Home Hospice services arranged. CVP stable at 8. Continue current HF meds. Will f/u with Dr. Jens Som. I am happy to see in HF Clinic as well if Dr. Jens Som would like.   Arvilla Meres, MD  1:21 PM

## 2020-01-15 NOTE — Progress Notes (Signed)
[facility]  Spoke with the daughter Stanton Kidney. Reviewed hospice services and she is in agreement with hospice philosophy. He has been approved for care at home by our medical director. I am ordering oxygen for the home. He will go to her home so that she can help care for him. The equipment will be delivered tomorrow Sat. Please call us with questions or concerns [phone]   Norm Parcel RN

## 2020-01-15 NOTE — Progress Notes (Signed)
Patient ID: [employer], male   DOB: [birth year], 86 y.o.   MRN: [id number]     Advanced Heart Failure Rounding Note  PCP-Cardiologist: Kirk Ruths, MD   Subjective:    Feeling better today. Able to walk some with nurses. Denies CP, SOB, orthopnea or PND.  Met with Palliative. Planning on home with Hopsice  CVP 6 Co-ox 68%  Objective:   Weight Range: 92 kg Body mass index is 30.85 kg/m.   Vital Signs:   Temp:  [97.3 F (36.3 C)-97.7 F (36.5 C)] 97.7 F (36.5 C) (02/05 1403) Pulse Rate:  [80-93] 81 (02/05 1403) Resp:  [14-21] 18 (02/05 1403) BP: (84-103)/(55-80) 103/58 (02/05 1403) SpO2:  [98 %-100 %] 100 % (02/05 1403) Weight:  [92 kg] 92 kg (02/05 0624) Last BM Date: 01/15/20  Weight change: Filed Weights   01/13/20 0430 01/14/20 0617 01/15/20 0624  Weight: 80.1 kg 91.4 kg 92 kg    Intake/Output:   Intake/Output Summary (Last 24 hours) at 01/15/2020 1406 Last data filed at 01/15/2020 1147 Gross per 24 hour  Intake 870 ml  Output 2475 ml  Net -1605 ml      Physical Exam   PHYSICAL EXAM: CVP 6 General:  Elderly. No resp difficulty HEENT: normal Neck: supple. no JVD. Carotids 2+ bilat; no bruits. No lymphadenopathy or thryomegaly appreciated. Cor: PMI nondisplaced. Irregular rate & rhythm. No rubs, gallops or murmurs. Lungs: clear Abdomen: soft, nontender, nondistended. No hepatosplenomegaly. No bruits or masses. Good bowel sounds. Extremities: no cyanosis, clubbing, rash, edema Neuro: alert & orientedx3, cranial nerves grossly intact. moves all 4 extremities w/o difficulty. Affect pleasant   Telemetry   Afib w/ frequent PVCs,  Low 100s personally reviewed.    Labs    CBC Recent Labs    01/14/20 0543 01/15/20 0437  WBC 6.2 6.0  HGB 12.3* 12.5*  HCT 37.7* 37.7*  MCV 92.4 93.5  PLT 67* 66*   Basic Metabolic Panel Recent Labs    01/13/20 0500 01/13/20 0625 01/14/20 0543 01/15/20 0437  NA  --    < > 134* 132*  K  --    < >  3.4* 3.9  CL  --    < > 96* 96*  CO2  --    < > 30 30  GLUCOSE  --    < > 120* 127*  BUN  --    < > 27* 31*  CREATININE  --    < > 1.08 0.99  CALCIUM  --    < > 8.4* 8.4*  MG 1.6*  --  2.1  --    < > = values in this interval not displayed.   Liver Function Tests No results for input(s): AST, ALT, ALKPHOS, BILITOT, PROT, ALBUMIN in the last 72 hours. No results for input(s): LIPASE, AMYLASE in the last 72 hours. Cardiac Enzymes No results for input(s): CKTOTAL, CKMB, CKMBINDEX, TROPONINI in the last 72 hours.  BNP: BNP (last 3 results) Recent Labs    12/30/19 0326 01/03/20 0849 01/05/20 1019  BNP 1,321.1* 1,494.6* 1,374.1*    ProBNP (last 3 results) No results for input(s): PROBNP in the last 8760 hours.   D-Dimer No results for input(s): DDIMER in the last 72 hours. Hemoglobin A1C No results for input(s): HGBA1C in the last 72 hours. Fasting Lipid Panel No results for input(s): CHOL, HDL, LDLCALC, TRIG, CHOLHDL, LDLDIRECT in the last 72 hours. Thyroid Function Tests No results for input(s): TSH, T4TOTAL, T3FREE, THYROIDAB in the  last 72 hours.  Invalid input(s): FREET3  Other results:   Imaging    No results found.   Medications:     Scheduled Medications: . allopurinol  100 mg Oral QPC breakfast  . amiodarone  200 mg Oral BID  . [START ON 01/21/2020] amiodarone  200 mg Oral Daily  . apixaban  5 mg Oral BID  . atorvastatin  20 mg Oral QPC supper  . Chlorhexidine Gluconate Cloth  6 each Topical Q0600  . digoxin  0.0625 mg Oral Daily  . docusate sodium  100 mg Oral BID  . donepezil  5 mg Oral QHS  . magnesium oxide  400 mg Oral Daily  . potassium chloride  40 mEq Oral BID  . sodium chloride flush  10-40 mL Intracatheter Q12H  . tamsulosin  0.4 mg Oral BID PC  . torsemide  40 mg Oral Daily    Infusions:   PRN Medications: acetaminophen, ipratropium-albuterol, Melatonin, ondansetron (ZOFRAN) IV, sodium chloride flush    Assessment/Plan     1. Cardiogenic shock - NICM, echo 12/12/19 EF 30-35% with mild-moderate MR and moderate RV dysfunction. (previously 40-45%) - Normal cors on cath 2006 - s/p CRT-P - dobutamine discontinued 1/31.  - CO-OX 68%   - CVP 6. On torsemide 40 daily  - losartan and spiro held for hypotension. Will continue to hold for now  - no  blocker w/ shock - Continue digoxin, dig level 0.2 (2/3)  2. Acute on chronic systolic HF - plan as above - overall improved. Appreciate Palliative input - Agree with plan for Home with Hospice Care and we will follow in HF Clinic   3. Permanent AF/PVCs - s/p CRT-P -Continue eliquis 5 mg twice a day  - continue digoxin, dig level ok, 0.2  - no  blocker 2/2 shock  - PVC burden improved on amio   4. AKI - due to shock - Creatinine peaked at 1.8 but improved - Todays creatinine 0.99  5. DM2 - controlled. Hgb A1c 7.0  6. Hyponatremia  - Na as low at 127 this admit - 132 today   7. Dementia - Aricept  8. DNR/DNI   9. Deconditioning - PT has recommended HH PT/RN + Rolling walker w/ 5" wheels when discharged home (orders placed)   Length of Stay: [place], MD  01/15/2020, 2:06 PM  Advanced Heart Failure Team Pager [phone] (M-F; [place])  Please contact [facility] for night-coverage after hours (4p -7a ) and weekends on [email or web address]

## 2020-01-15 NOTE — TOC Progression Note (Addendum)
Transition of Care (TOC) - Progression Note  Donn Pierini RN, BSN Transitions of Care Unit 4E- RN Case Manager (6E cross coverage) [phone]   Patient Details  Name: [name] MRN: [id number] Date of Birth: Feb 10, 1929  Transition of Care [facility]) CM/SW Contact  Zenda Alpers, Lenn Sink, RN Phone Number: 01/15/2020, 4:06 PM  Clinical Narrative:    Referral received for home hospice needs- CM spoke with daughter at the bedside to confirm Hospice choice and to discuss transition needs- daughter confirms she wants [facility] as she has used them in the past. Daughter reports pt has RW and BSC at home, does not want a hospital bed at this time- however pt will need home 02 for PRN use. Daughter states she would like to try to get pt home via car if able- open to EMS if needed. Pt will need GOLD DNR for home. Pt will be going to daughter's home at time of discharge-  [address], [place] [address] contact # [phone] Call made to Norm Parcel at [facility] of the [facility] for home hospice referral. Once hospice services confirmed- DME will be ordered by hospice for home 02 and delivered to the home. Daughter would prefer Sunday discharge as she will have extra help that day to get patient home. Per Dennard Nip they also would prefer Sunday discharge due to staffing and already having a full schedule for Saturday admissions.  Once home 02 confirmed delivered to home and home hospice confirmed- pt may discharge home with daughter.  CM has contacted Tiffany with [facility] to notify that pt will not need [facility] services as he will be going under hospice for transition home.   Expected Discharge Plan: Home w Hospice Care Barriers to Discharge: No Barriers Identified  Expected Discharge Plan and Services Expected Discharge Plan: Home w Hospice Care In-house Referral: Hospice / Palliative Care Discharge Planning Services: CM Consult Post Acute Care Choice: Hospice Living arrangements  for the past 2 months: Single Family Home                 DME Arranged: Oxygen DME Agency: Other - Comment     Representative spoke with at DME Agency: Hospice to arrange [facility] Arranged: Disease Management HH Agency: [facility] Date HH Agency Contacted: 01/15/20 Time HH Agency Contacted: 1604 Representative spoke with at [facility] Agency: Norm Parcel   Social Determinants of Health (SDOH) Interventions    Readmission Risk Interventions Readmission Risk Prevention Plan 12/28/2019  Transportation Screening Complete  PCP or Specialist Appt within 3-5 Days Complete  HRI or Home Care Consult Complete  Social Work Consult for Recovery Care Planning/Counseling Complete  Palliative Care Screening Not Applicable  Medication Review Oceanographer) Complete  Some recent data might be hidden

## 2020-01-15 NOTE — Consult Note (Signed)
   [facility] CM Inpatient Consult   01/15/2020  [employer] Nay [birth year] [id number]   Follow up:  [facility] of [facility] Medicare  Chart reviewed for progress and disposition.  Patient is currently being recommended for home with home health care. Also, noted a palliative consult in the works. His primary care provider is listed to follow up with the transition of care.  Plan: Will continue to follow notes from results of palliative consult also to assess for post hospital needs and determine follow up needs.  1500:  Review of the palliative consult reveals that the patient will be followed by hospice care.  Patient will receive full care management under Hospice and no further [facility] Care Management needs assessed.  Will sign off.  For questions, please contact:  Charlesetta Shanks, RN BSN CCM [facility]  [phone] business mobile phone Toll free office [phone]  Fax number: [phone] [email or web address]

## 2020-01-16 LAB — BASIC METABOLIC PANEL
Anion gap: 8 (ref 5–15)
BUN: 30 mg/dL — ABNORMAL HIGH (ref 8–23)
CO2: 27 mmol/L (ref 22–32)
Calcium: 8.4 mg/dL — ABNORMAL LOW (ref 8.9–10.3)
Chloride: 96 mmol/L — ABNORMAL LOW (ref 98–111)
Creatinine, Ser: 1.22 mg/dL (ref 0.61–1.24)
GFR calc Af Amer: >60 mL/min (ref >60)
GFR calc non Af Amer: 53 mL/min — ABNORMAL LOW (ref >60)
Glucose, Bld: 129 mg/dL — ABNORMAL HIGH (ref 70–99)
Potassium: 4.2 mmol/L (ref 3.5–5.1)
Sodium: 131 mmol/L — ABNORMAL LOW (ref 135–145)

## 2020-01-16 LAB — COOXEMETRY PANEL
Carboxyhemoglobin: 2 % — ABNORMAL HIGH (ref 0.5–1.5)
Methemoglobin: 0.9 % (ref 0.0–1.5)
O2 Saturation: 71.2 %
Total hemoglobin: 13 g/dL (ref 12.0–16.0)

## 2020-01-16 LAB — CBC
HCT: 39.2 % (ref 39.0–52.0)
Hemoglobin: 12.7 g/dL — ABNORMAL LOW (ref 13.0–17.0)
MCH: 30.2 pg (ref 26.0–34.0)
MCHC: 32.4 g/dL (ref 30.0–36.0)
MCV: 93.1 fL (ref 80.0–100.0)
Platelets: 71 10*3/uL — ABNORMAL LOW (ref 150–400)
RBC: 4.21 MIL/uL — ABNORMAL LOW (ref 4.22–5.81)
RDW: 14.9 % (ref 11.5–15.5)
WBC: 5.3 10*3/uL (ref 4.0–10.5)
nRBC: 0 % (ref 0.0–0.2)

## 2020-01-16 MED ORDER — POTASSIUM CHLORIDE CRYS ER 20 MEQ PO TBCR
40.0000 meq | EXTENDED_RELEASE_TABLET | Freq: Every day | ORAL | Status: DC
  Administered 2020-01-17: 09:00:00 40 meq via ORAL
  Filled 2020-01-16: qty 2

## 2020-01-16 NOTE — Progress Notes (Signed)
CARDIAC REHAB PHASE I   PRE:  Rate/Rhythm: 97 paced     SaO2: 96%RA  MODE:  Ambulation: 95 ft   POST:  Rate/Rhythm: 104 paced  BP:  Supine:   Sitting: 104/64  Standing:    SaO2: 99%RA 1003-1038 Pt walked 95 ft on RA with asst x 2, gait belt and rolling walker. To recliner with call bell after walk. Chair alarm on and notified staff that pt in chair. Pt encouraged not to get up by himself but to call staff. Stopped once to rest during walk.   Luetta Nutting, RN BSN  01/16/2020 10:34 AM

## 2020-01-16 NOTE — Progress Notes (Signed)
Patient ID: [employer], male   DOB: September 01, 1929, 86 y.o.   MRN: [id number]     Advanced Heart Failure Rounding Note  PCP-Cardiologist: Kirk Ruths, MD   Subjective:    Feeling better today. Walked 95 feet with PT. Now sitting in chair. Denies CP or SOB.  Home hospice services have been set up. Co-ox 71%  Weight stable at 202  Objective:   Weight Range: 91.8 kg Body mass index is 30.76 kg/m.   Vital Signs:   Temp:  [97.5 F (36.4 C)-97.7 F (36.5 C)] 97.5 F (36.4 C) (02/06 0604) Pulse Rate:  [81-93] 93 (02/06 0740) Resp:  [17-18] 17 (02/06 0740) BP: (94-103)/(58-64) 98/64 (02/06 0604) SpO2:  [95 %-100 %] 95 % (02/06 0740) Weight:  [91.8 kg] 91.8 kg (02/06 0700) Last BM Date: 01/16/20  Weight change: Filed Weights   01/14/20 0617 01/15/20 0624 01/16/20 0700  Weight: 91.4 kg 92 kg 91.8 kg    Intake/Output:   Intake/Output Summary (Last 24 hours) at 01/16/2020 1120 Last data filed at 01/16/2020 0702 Gross per 24 hour  Intake 910 ml  Output 2150 ml  Net -1240 ml      Physical Exam   PHYSICAL EXAM: General:  Elderly male. Well appearing. No resp difficulty HEENT: normal Neck: supple. JVP 5-6. Carotids 2+ bilat; no bruits. No lymphadenopathy or thryomegaly appreciated. Cor: PMI nondisplaced. Irregular rate & rhythm. No rubs, gallops or murmurs. Lungs: clear Abdomen: soft, nontender, nondistended. No hepatosplenomegaly. No bruits or masses. Good bowel sounds. Extremities: no cyanosis, clubbing, rash, edema Neuro: alert & orientedx3, cranial nerves grossly intact. moves all 4 extremities w/o difficulty. Affect pleasant   Telemetry   Afib w/PVCs,  90s Personally reviewed  Labs    CBC Recent Labs    01/15/20 0437 01/16/20 0644  WBC 6.0 5.3  HGB 12.5* 12.7*  HCT 37.7* 39.2  MCV 93.5 93.1  PLT 66* 71*   Basic Metabolic Panel Recent Labs    01/14/20 0543 01/14/20 0543 01/15/20 0437 01/16/20 0644  NA 134*   < > 132* 131*  K 3.4*   < >  3.9 4.2  CL 96*   < > 96* 96*  CO2 30   < > 30 27  GLUCOSE 120*   < > 127* 129*  BUN 27*   < > 31* 30*  CREATININE 1.08   < > 0.99 1.22  CALCIUM 8.4*   < > 8.4* 8.4*  MG 2.1  --   --   --    < > = values in this interval not displayed.   Liver Function Tests No results for input(s): AST, ALT, ALKPHOS, BILITOT, PROT, ALBUMIN in the last 72 hours. No results for input(s): LIPASE, AMYLASE in the last 72 hours. Cardiac Enzymes No results for input(s): CKTOTAL, CKMB, CKMBINDEX, TROPONINI in the last 72 hours.  BNP: BNP (last 3 results) Recent Labs    12/30/19 0326 01/03/20 0849 01/05/20 1019  BNP 1,321.1* 1,494.6* 1,374.1*    ProBNP (last 3 results) No results for input(s): PROBNP in the last 8760 hours.   D-Dimer No results for input(s): DDIMER in the last 72 hours. Hemoglobin A1C No results for input(s): HGBA1C in the last 72 hours. Fasting Lipid Panel No results for input(s): CHOL, HDL, LDLCALC, TRIG, CHOLHDL, LDLDIRECT in the last 72 hours. Thyroid Function Tests No results for input(s): TSH, T4TOTAL, T3FREE, THYROIDAB in the last 72 hours.  Invalid input(s): FREET3  Other results:   Imaging  No results found.   Medications:     Scheduled Medications: . allopurinol  100 mg Oral QPC breakfast  . amiodarone  200 mg Oral BID  . [START ON 01/21/2020] amiodarone  200 mg Oral Daily  . apixaban  5 mg Oral BID  . atorvastatin  20 mg Oral QPC supper  . Chlorhexidine Gluconate Cloth  6 each Topical Q0600  . digoxin  0.0625 mg Oral Daily  . docusate sodium  100 mg Oral BID  . donepezil  5 mg Oral QHS  . magnesium oxide  400 mg Oral Daily  . potassium chloride  40 mEq Oral BID  . sodium chloride flush  10-40 mL Intracatheter Q12H  . tamsulosin  0.4 mg Oral BID PC  . torsemide  40 mg Oral Daily    Infusions:   PRN Medications: acetaminophen, ipratropium-albuterol, Melatonin, ondansetron (ZOFRAN) IV, sodium chloride flush    Assessment/Plan    1.  Cardiogenic shock - NICM, echo 12/12/19 EF 30-35% with mild-moderate MR and moderate RV dysfunction. (previously 40-45%) - Normal cors on cath 2006 - s/p CRT-P - dobutamine discontinued 1/31.  - CO-OX 71%   - Volume status stable On torsemide 40 daily  - losartan and spiro held for hypotension. Will continue to hold for now  - no  blocker w/ shock - Continue digoxin, dig level 0.2 (2/3)  2. Acute on chronic systolic HF - plan as above - overall improved. Appreciate Palliative input - Agree with plan for Home with Hospice Care and we will follow in HF Clinic  - Stable for d/c today   3. Permanent AF/PVCs - s/p CRT-P -Continue eliquis 5 mg twice a day  - continue digoxin, dig level ok, 0.2  - no  blocker 2/2 shock  - PVC burden improved on amio   4. AKI - due to shock - Creatinine peaked at 1.8 but improved - Todays creatinine 1.2  5. DM2 - controlled. Hgb A1c 7.0  6. Hyponatremia  - Na as low at 127 this admit - 131 today   7. Dementia - Aricept  8. DNR/DNI   9. Deconditioning - PT has recommended HH PT/RN + Rolling walker w/ 5" wheels when discharged home (orders placed)  Plan: home tomorrow with Hospice following. Hopefully we can help manage volume effectively.    Length of Stay: 23  Arvilla Meres, MD  01/16/2020, 11:20 AM  Advanced Heart Failure Team Pager [phone] (M-F; 7a - 4p)  Please contact [facility] for night-coverage after hours (4p -7a ) and weekends on [email or web address]

## 2020-01-16 NOTE — Progress Notes (Signed)
Patient does not need BiPAP at this time. RT will monitor as needed.

## 2020-01-17 LAB — CBC
HCT: 36.4 % — ABNORMAL LOW (ref 39.0–52.0)
Hemoglobin: 12.1 g/dL — ABNORMAL LOW (ref 13.0–17.0)
MCH: 30.3 pg (ref 26.0–34.0)
MCHC: 33.2 g/dL (ref 30.0–36.0)
MCV: 91.2 fL (ref 80.0–100.0)
Platelets: 74 10*3/uL — ABNORMAL LOW (ref 150–400)
RBC: 3.99 MIL/uL — ABNORMAL LOW (ref 4.22–5.81)
RDW: 15.1 % (ref 11.5–15.5)
WBC: 5.3 10*3/uL (ref 4.0–10.5)
nRBC: 0 % (ref 0.0–0.2)

## 2020-01-17 LAB — COOXEMETRY PANEL
Carboxyhemoglobin: 2.2 % — ABNORMAL HIGH (ref 0.5–1.5)
Methemoglobin: 1 % (ref 0.0–1.5)
O2 Saturation: 76.9 %
Total hemoglobin: 12.3 g/dL (ref 12.0–16.0)

## 2020-01-17 LAB — GLUCOSE, CAPILLARY: Glucose-Capillary: 155 mg/dL — ABNORMAL HIGH (ref 70–99)

## 2020-01-17 MED ORDER — AMIODARONE HCL 200 MG PO TABS: 200.0000 mg | ORAL_TABLET | Freq: Two times a day (BID) | ORAL | 0 refills | Status: AC

## 2020-01-17 MED ORDER — AMIODARONE HCL 200 MG PO TABS: 200.0000 mg | ORAL_TABLET | Freq: Two times a day (BID) | ORAL | Status: DC

## 2020-01-17 NOTE — Progress Notes (Signed)
Notified Morrie Sheldon, CM that code 44 notification in chart needing to be addressed

## 2020-01-17 NOTE — Progress Notes (Addendum)
Progress Note  Patient Name: [name] Date of Encounter: 01/17/2020  Primary Cardiologist: Dr. Olga Millers, MD/AHF-Dr. Gala Romney, MD   Subjective   Pt feeling well today. Reports that he is ready to go home. Spoke with RN who states that she has spoken to HH/palliative services today. All equipment is currently at his home. Denies CP, SOB.   Inpatient Medications    Scheduled Meds: . allopurinol  100 mg Oral QPC breakfast  . [START ON 01/21/2020] amiodarone  200 mg Oral Daily  . apixaban  5 mg Oral BID  . atorvastatin  20 mg Oral QPC supper  . Chlorhexidine Gluconate Cloth  6 each Topical Q0600  . digoxin  0.0625 mg Oral Daily  . docusate sodium  100 mg Oral BID  . donepezil  5 mg Oral QHS  . magnesium oxide  400 mg Oral Daily  . potassium chloride  40 mEq Oral Daily  . sodium chloride flush  10-40 mL Intracatheter Q12H  . tamsulosin  0.4 mg Oral BID PC  . torsemide  40 mg Oral Daily   Continuous Infusions:  PRN Meds: acetaminophen, ipratropium-albuterol, Melatonin, ondansetron (ZOFRAN) IV, sodium chloride flush   Vital Signs    Vitals:   01/16/20 2042 01/17/20 0043 01/17/20 0632 01/17/20 0925  BP: (!) 87/74  (!) 89/60 119/88  Pulse: 79 (!) 45 91 93  Resp: 17  19 18   Temp: (!) 97.5 F (36.4 C)  (!) 97.4 F (36.3 C)   TempSrc: Oral  Oral   SpO2: 99% 94% 97% 100%  Weight:   92.1 kg   Height:        Intake/Output Summary (Last 24 hours) at 01/17/2020 1202 Last data filed at 01/17/2020 1110 Gross per 24 hour  Intake 702 ml  Output 3151 ml  Net -2449 ml   Filed Weights   01/15/20 0624 01/16/20 0700 01/17/20 03/16/20  Weight: 92 kg 91.8 kg 92.1 kg    Physical Exam   General: Elderly, Well developed, well nourished, NAD Neck: Negative for carotid bruits. + JVD Lungs:Clear to ausculation bilaterally. No wheezes, rales, or rhonchi. Breathing is unlabored. Cardiovascular: Irregularly irregular with S1 S2. No murmurs, rubs, gallops, or LV heave  appreciated. Extremities: No edema. DP pulses 2+ bilaterally Neuro: Alert and oriented x3. MAE spontaneously. Psych: Responds to questions somewhat appropriately, pleasant affect.    Labs    Chemistry Recent Labs  Lab 01/14/20 0543 01/15/20 0437 01/16/20 0644  NA 134* 132* 131*  K 3.4* 3.9 4.2  CL 96* 96* 96*  CO2 30 30 27   GLUCOSE 120* 127* 129*  BUN 27* 31* 30*  CREATININE 1.08 0.99 1.22  CALCIUM 8.4* 8.4* 8.4*  GFRNONAA >60 >60 53*  GFRAA >60 >60 >60  ANIONGAP 8 6 8      Hematology Recent Labs  Lab 01/15/20 0437 01/16/20 0644 01/17/20 0500  WBC 6.0 5.3 5.3  RBC 4.03* 4.21* 3.99*  HGB 12.5* 12.7* 12.1*  HCT 37.7* 39.2 36.4*  MCV 93.5 93.1 91.2  MCH 31.0 30.2 30.3  MCHC 33.2 32.4 33.2  RDW 14.7 14.9 15.1  PLT 66* 71* 74*    Cardiac EnzymesNo results for input(s): TROPONINI in the last 168 hours. No results for input(s): TROPIPOC in the last 168 hours.   BNPNo results for input(s): BNP, PROBNP in the last 168 hours.   DDimer No results for input(s): DDIMER in the last 168 hours.   Radiology    No results found.  Telemetry  01/17/20 AF with PVCs, V-paced with rates in the 90's - Personally Reviewed  ECG    No new tracing as of 01/17/20- Personally Reviewed  Cardiac Studies   Echocardiogram 12/12/19:  Left ventricular ejection fraction, by visual estimation, is 30 to  35%. The left ventricle has moderate to severely decreased function. There  is no left ventricular hypertrophy.  2. Definity contrast agent was given IV to delineate the left ventricular  endocardial borders.  3. Left ventricular diastolic function could not be evaluated.  4. The left ventricle demonstrates global hypokinesis.  5. Global right ventricle has normal systolic function.The right  ventricular size is normal. No increase in right ventricular wall  thickness.  6. Left atrial size was severely dilated.  7. Right atrial size was severely dilated.  8. The mitral valve  is normal in structure. Mild to moderate mitral valve  regurgitation. No evidence of mitral stenosis.  9. The tricuspid valve is normal in structure.  10. The aortic valve is normal in structure. Aortic valve regurgitation is  not visualized. Mild to moderate aortic valve sclerosis/calcification  without any evidence of aortic stenosis.  11. The pulmonic valve was normal in structure. Pulmonic valve  regurgitation is trivial.  12. Aortic dilatation noted.  13. There is mild dilatation of the ascending aorta measuring 42 mm.  14. Compared with the echo 02/2018, the ascending aorta has increased from  3.8 cm to 4.2 cm.  15. Moderately elevated pulmonary artery systolic pressure.  16. A pacer wire is visualized.  17. The inferior vena cava is normal in size with greater than 50%  respiratory variability, suggesting right atrial pressure of 3 mmHg.   Patient Profile     86 y.o. male with h/o DM2, permanent AF, HTN and chronic systolic HF due to NICM - previous EF 40-45% in 2018. Has h/o CRT-D which was previously changed to CRT-P  Assessment & Plan    1. Cardiogenic shock/NICM s/p CRT-P: -Echocardiogram 12/12/19 with LVEF at 30-35% with mild to moderate RV dysfunction (previously 40-45%). -Normal coronary anatomy on cath from 2006 -Previously on dobutamine>>>discotninued 01/10/20 -CO-OX yesterday 71 -Appears euvolemic on exam>>on torsemide 40mg  PO QD  -No losartan/spiro at this time secondary to hypotension  -No BB therapy with shock -Continue digoxin (dig level 0.2 on 01/13/20)  2. Acute on chronic systolic HF: -As above  -Plan for discharge home with hospice care/follow up with AHF clinic   3. Permanent AF/PVCs: -s/p CRT-P  -Continue AC with Eliquis 5mg  PO BID, Digoxin -No BB in the setting of shock  -PVC burden improved on amiodarone    4. AKI: -Secondary to shock>>creatinine yesterday 1.22>>no current labs for today  -Peak creatinine at 1.8  5. DM2: -Controlled. Hgb A1c  7.0  6. Hyponatremia:  -Stable, Na+ yesterday 131  7. Dementia: -Continue Aricept  8. DNR/DNI: -Established  -Going home with hospice care    9. Deconditioning: -PT has recommended HH PT/RN + Rolling walker w/ 5" wheels when discharged home (orders placed)   Signed, 03/12/20 NP-C [facility] Pager: [phone] 01/17/2020, 12:02 PM    Patient seen and examined with the above-signed Advanced Practice Provider and/or Housestaff. I personally reviewed laboratory data, imaging studies and relevant notes. I independently examined the patient and formulated the important aspects of the plan. I have edited the note to reflect any of my changes or salient points. I have personally discussed the plan with the patient and/or family.  He looks good. Ready for d/c today with  Home Hospice services arranged. CVP stable at 8. Continue current HF meds. Will f/u with Dr. [name]. I am happy to see in HF Clinic as well if Dr. [name] would like.   Steven Bickers, MD  1:21 PM

## 2020-01-17 NOTE — Plan of Care (Signed)
  Problem: Education: Goal: Ability to demonstrate management of disease process will improve Outcome: Progressing Goal: Ability to verbalize understanding of medication therapies will improve Outcome: Progressing Goal: Individualized Educational Video(s) Outcome: Progressing   Problem: Cardiac: Goal: Ability to achieve and maintain adequate cardiopulmonary perfusion will improve Outcome: Progressing   Problem: Education: Goal: Knowledge of General Education information will improve Description Including pain rating scale, medication(s)/side effects and non-pharmacologic comfort measures Outcome: Progressing   

## 2020-01-17 NOTE — Progress Notes (Signed)
I reviewed d/c instructions with patients daughter, Eunice Blase. I gave her the gold DNR form and paperwork. She had no further questions. Site where IJ was taken out was unremarkable with dressing covering it. I discharged patient via wheelchair to private vehicle. No further questions.

## 2020-01-18 NOTE — Discharge Summary (Signed)
Advanced Heart Failure Team  Discharge Summary   Patient ID: [name] MRN: [id number], DOB/AGE: 1929/07/05 86 y.o. Admit date: 12/23/2019 D/C date:     01/17/2020   Primary Discharge Diagnoses:  Acute on Chronic Systolic Heart Failure / Cardiogenic Shock AKI Permanent Atrial Fibrillation/ PVCs Type 2 Diabetes Hypotension  Hyponatremia Dementia  Deconditioning    Hospital Course:  86 y/o male with h/o DM2, permanent AF, HTN and chronic systolic HF due to NICM - previous EF 40-45% in 2018. Has h/o CRT-D which was previously changed to CRT-P  Admitted 12/11/19 - 12/18/19 with worsening HF. Echo showed drop in EF to ~25% with moderate MR and at least moderate RV dysfunction. Underwent IV diuresis and d/c'd home. Carvedilol added. GOC conversation at that time with his daughter no intubation but ok with CPR/shock. D/c weight 223.   Re-admitted on 12/24/19 with recurrent HF and volume overload. Initially seemed to improved with diuresis weight 225 ->215 on 1/20. However developed progressive wt gain, up to 231 lb and became nonresponsive to IV lasix. Became markedly volume overloaded w/ evidence of low output.   AHF consulted for further management.  After discussion w/ patient and family, decision was made to proceed w/ trial of aggressive therapy w/ central line placement and treatment w/ inotrope, but no other aggressive measures. CODE status changed to DNR. Central line placed and started on Dobutamine and continued on IV Lasix. Overall diuresed 13 L with close to 30 lb wt decrease. Dobutamine discontinued on 1/31. Co-ox off dobutamine 68%. Transitioned back to PO diuretics, torsemide 40 mg daily. BP did not allow aggressive treatment w/ GDMT. BP too soft for ARB/ARNI and spironolactone. No ? blocker due to shock. Digoxin added.   He remained weak and intermittently confused. Per family, he has had continued physical and functional decline over the last several months. Overall  prognosis felt to be poor and not a candidate for advanced HF therapies. Palliative care consulted to discuss goals of care. Pt and family agreed to home hospice care.   Home Hospice Arranged including [facility] PT/RN with rolling walker w/ 5" wheels (order placed). Patient seen and examined by Dr. Gala Romney and felt stable for discharge home.   Discharge Weight Range: 91.8 kg  Discharge Vitals: Blood pressure 119/88, pulse 93, temperature (!) 97.4 F (36.3 C), temperature source Oral, resp. rate 18, height 5\' 8"  (1.727 m), weight 92.1 kg, SpO2 100 %.  Labs: Lab Results  Component Value Date   WBC 5.3 01/17/2020   HGB 12.1 (L) 01/17/2020   HCT 36.4 (L) 01/17/2020   MCV 91.2 01/17/2020   PLT 74 (L) 01/17/2020    Recent Labs  Lab 01/16/20 0644  NA 131*  K 4.2  CL 96*  CO2 27  BUN 30*  CREATININE 1.22  CALCIUM 8.4*  GLUCOSE 129*   Lab Results  Component Value Date   CHOL 163 06/16/2019   HDL 74.60 06/16/2019   LDLCALC 78 06/16/2019   TRIG 54.0 06/16/2019   BNP (last 3 results) Recent Labs    12/30/19 0326 01/03/20 0849 01/05/20 1019  BNP 1,321.1* 1,494.6* 1,374.1*    ProBNP (last 3 results) No results for input(s): PROBNP in the last 8760 hours.   Diagnostic Studies/Procedures   No results found.  Discharge Medications   Allergies as of 01/17/2020   No Known Allergies     Medication List    STOP taking these medications   furosemide 40 MG tablet Commonly known as: LASIX  TAKE these medications   acetaminophen 650 MG CR tablet Commonly known as: TYLENOL Take 1,300 mg by mouth at bedtime as needed for pain.   allopurinol 100 MG tablet Commonly known as: ZYLOPRIM TAKE 1 TABLET EVERY DAY What changed: when to take this   amiodarone 200 MG tablet Commonly known as: PACERONE Take 1 tablet (200 mg total) by mouth 2 (two) times daily for 7 doses.   amiodarone 200 MG tablet Commonly known as: PACERONE Take 1 tablet (200 mg total) by mouth daily. Start  taking on: January 21, 2020   apixaban 5 MG Tabs tablet Commonly known as: Eliquis TAKE 1 TABLET(5 MG) BY MOUTH TWICE DAILY What changed:   how much to take  how to take this  when to take this  additional instructions   atorvastatin 20 MG tablet Commonly known as: LIPITOR TAKE 1 TABLET EVERY DAY What changed: when to take this   cholecalciferol 1000 units tablet Commonly known as: VITAMIN D Take 1,000 Units by mouth daily after breakfast.   Digoxin 62.5 MCG Tabs Take 0.0625 mg by mouth daily.   donepezil 5 MG tablet Commonly known as: ARICEPT TAKE 1 TABLET  BY MOUTH AT BEDTIME.   magnesium oxide 400 (241.3 Mg) MG tablet Commonly known as: MAG-OX TAKE 1 TABLET (400 MG TOTAL) BY MOUTH 2 (TWO) TIMES DAILY. What changed:   when to take this  additional instructions   MELATONIN ER PO Take 1 tablet by mouth at bedtime.   STOOL SOFTENER PO Take 2 capsules by mouth 2 (two) times daily.   tamsulosin 0.4 MG Caps capsule Commonly known as: FLOMAX TAKE 1 CAPSULE TWICE DAILY What changed: when to take this   torsemide 20 MG tablet Commonly known as: DEMADEX Take 2 tablets (40 mg total) by mouth daily.       Disposition   The patient will be discharged in stable condition to home. Discharge Instructions    Call MD for:  difficulty breathing, headache or visual disturbances   Complete by: As directed    Call MD for:  extreme fatigue   Complete by: As directed    Call MD for:  hives   Complete by: As directed    Call MD for:  persistant dizziness or light-headedness   Complete by: As directed    Call MD for:  persistant nausea and vomiting   Complete by: As directed    Call MD for:  redness, tenderness, or signs of infection (pain, swelling, redness, odor or green/yellow discharge around incision site)   Complete by: As directed    Call MD for:  severe uncontrolled pain   Complete by: As directed    Call MD for:  temperature >100.4   Complete by: As  directed    Diet - low sodium heart healthy   Complete by: As directed    Face-to-face encounter (required for Medicare/Medicaid patients)   Complete by: As directed    I Lyda Jester certify that this patient is under my care and that I, or a nurse practitioner or physician's assistant working with me, had a face-to-face encounter that meets the physician face-to-face encounter requirements with this patient on 01/12/2020. The encounter with the patient was in whole, or in part for the following medical condition(s) which is the primary reason for home health care (List medical condition): chronic systolic heart failure   The encounter with the patient was in whole, or in part, for the following medical condition, which is the  primary reason for home health care: chronic systolic heart failure   I certify that, based on my findings, the following services are medically necessary home health services:  Nursing Physical therapy     Reason for Medically Necessary Home Health Services: Skilled Nursing- Change/Decline in Patient Status   My clinical findings support the need for the above services: Cognitive impairments, dementia, or mental confusion  that make it unsafe to leave home   Further, I certify that my clinical findings support that this patient is homebound due to: Unable to leave home safely without assistance   Home Health   Complete by: As directed    To provide the following care/treatments:  PT RN     Increase activity slowly   Complete by: As directed    Walker rolling   Complete by: As directed    5" wheels     Follow-up Information    [facility] Follow up.   Why: Referral made for Home Hospice needs and DME Contact information: [address] [place] [address] [phone]        Lewayne Bunting, MD Follow up on 02/01/2020.   Specialty: Cardiology Why: Your follow up appointment will be on 02/01/20 at 240pm with Dr. Jens Som.  Contact  information: [address] [place] [address] [phone]             Duration of Discharge Encounter: Greater than 35 minutes    Signed, Georgie Chard PA-C 01/17/2020, 1:32 PM   Patient seen and examined with the above-signed Advanced Practice Provider and/or Housestaff. I personally reviewed laboratory data, imaging studies and relevant notes. I independently examined the patient and formulated the important aspects of the plan. I have edited the note to reflect any of my changes or salient points. I have personally discussed the plan with the patient and/or family.  He looks good. Ready for d/c today with Home Hospice services arranged. CVP stable at 8. Continue current HF meds. Will f/u with Dr. Jens Som. I am happy to see in HF Clinic as well if Dr. Jens Som would like.   Arvilla Meres, MD  1:21 PM

## 2020-01-19 ENCOUNTER — Other Ambulatory Visit: Payer: Self-pay

## 2020-01-19 NOTE — Patient Outreach (Signed)
[facility]) Care Management  01/19/2020  [facility] Council [place]mons [birth year] [id number]   Referral Date: 01/19/20 Referral Source: [facility] Report Referral Reason:Discharge [facility] 01/17/20   Referral received.  No outreach warranted at this time.  Transition of Care calls being completed via EMMI. RN CM will outreach patient for any red flags received.    Plan: RN CM will close case.     Bary Leriche, RN, MSN [facility] Care Management Care Management Coordinator Direct Line [phone] Toll Free: [phone]  Fax: [phone]

## 2020-01-20 ENCOUNTER — Encounter: Payer: Self-pay | Admitting: Family Medicine

## 2020-01-26 NOTE — Progress Notes (Deleted)
HPI: FU nonischemic cardiomyopathy as well as atrial fibrillation. Note, he had a catheterization in January 2006 that showed no coronary artery disease and an ejection fraction of 40%. He had a Myoview performed last on May 04, 2008, that showed an ejection fraction of 31%. There was felt to be a prior inferior infarct with mild peri-infarct ischemia. I did review this and felt there was a low risk and we have been treating medically. He does have permanent atrial fibrillation as well. Holter monitor in August of 2011 showed mildly reduced rate and we decreased his Coreg. In November of 2013 the patient had a biventricular ICD placed; downgraded to BIV pacemaker 8/17.  Recent admissions for congestive heart failure the last requiring dobutamine. Echocardiogram repeated January 2021 and showed ejection fraction 30 to 35%, severe biatrial enlargement, mild to moderate mitral regurgitation. Patient treated medically. Discharged with home hospice services. Since I last saw him,  Current Outpatient Medications  Medication Sig Dispense Refill  . acetaminophen (TYLENOL) 650 MG CR tablet Take 1,300 mg by mouth at bedtime as needed for pain.     [name] allopurinol (ZYLOPRIM) 100 MG tablet TAKE 1 TABLET EVERY DAY (Patient taking differently: Take 100 mg by mouth daily after breakfast. ) 90 tablet 3  . amiodarone (PACERONE) 200 MG tablet Take 1 tablet (200 mg total) by mouth daily. 30 tablet 3  . amiodarone (PACERONE) 200 MG tablet Take 1 tablet (200 mg total) by mouth 2 (two) times daily for 7 doses. 7 tablet 0  . apixaban (ELIQUIS) 5 MG TABS tablet TAKE 1 TABLET(5 MG) BY MOUTH TWICE DAILY (Patient taking differently: Take 5 mg by mouth 2 (two) times daily after a meal. ) 60 tablet 5  . atorvastatin (LIPITOR) 20 MG tablet TAKE 1 TABLET EVERY DAY (Patient taking differently: Take 20 mg by mouth daily after supper. ) 90 tablet 1  . cholecalciferol (VITAMIN D) 1000 units tablet Take 1,000 Units by mouth daily  after breakfast.     . digoxin 62.5 MCG TABS Take 0.0625 mg by mouth daily. 30 tablet 0  . Docusate Calcium (STOOL SOFTENER PO) Take 2 capsules by mouth 2 (two) times daily.     [name] donepezil (ARICEPT) 5 MG tablet TAKE 1 TABLET  BY MOUTH AT BEDTIME. (Patient taking differently: Take 5 mg by mouth at bedtime. ) 90 tablet 0  . magnesium oxide (MAG-OX) 400 (241.3 Mg) MG tablet TAKE 1 TABLET (400 MG TOTAL) BY MOUTH 2 (TWO) TIMES DAILY. (Patient taking differently: Take 400 mg by mouth See admin instructions. Take one tablet (400 mg) by mouth twice daily - after breakfast and at 2pm) 180 tablet 3  . MELATONIN ER PO Take 1 tablet by mouth at bedtime.    . tamsulosin (FLOMAX) 0.4 MG CAPS capsule TAKE 1 CAPSULE TWICE DAILY (Patient taking differently: Take 0.4 mg by mouth 2 (two) times daily after a meal. ) 180 capsule 1  . torsemide (DEMADEX) 20 MG tablet Take 2 tablets (40 mg total) by mouth daily. 30 tablet 3   No current facility-administered medications for this visit.     Past Medical History:  Diagnosis Date  . Anemia   . Atrial fibrillation (HCC)   . BPH (benign prostatic hyperplasia)   . Bradycardia   . Cardiomyopathy   . CHF (congestive heart failure) (HCC)   . Diabetes mellitus type II   . GERD (gastroesophageal reflux disease)   . History of colonoscopy   . HTN (  hypertension)   . Hyperlipidemia   . ICD (implantable cardiac defibrillator) in place 10/14/2012   biventricular  . OA (osteoarthritis)   . Obesity   . Peptic ulcer     Past Surgical History:  Procedure Laterality Date  . EP IMPLANTABLE DEVICE Left   . EP IMPLANTABLE DEVICE N/A 07/27/2016   Procedure: BIV Pacemaker downgrade;  Surgeon: Evans Lance, MD;  Location: [facility];  Service: Cardiovascular;  Laterality: N/A;  . ESOPHAGOGASTRODUODENOSCOPY  06/24/2002  . L-spine  1965  . Lumbar L4-5 & S1  02/2000  . TOTAL KNEE ARTHROPLASTY  1997   right  . TOTAL KNEE ARTHROPLASTY Left 02/15/2015   Procedure: LEFT  TOTAL KNEE ARTHROPLASTY;  Surgeon: Paralee Cancel, MD;  Location: [facility];  Service: Orthopedics;  Laterality: Left;    Social History   Socioeconomic History  . Marital status: Widowed    Spouse name: Not on file  . Number of children: Not on file  . Years of education: Not on file  . Highest education level: Not on file  Occupational History  . Occupation: Retired  Tobacco Use  . Smoking status: Never Smoker  . Smokeless tobacco: Never Used  Substance and Sexual Activity  . Alcohol use: No  . Drug use: No  . Sexual activity: Not on file  Other Topics Concern  . Not on file  Social History Narrative  . Not on file   Social Determinants of Health   Financial Resource Strain:   . Difficulty of Paying Living Expenses: Not on file  Food Insecurity:   . Worried About Charity fundraiser in the Last Year: Not on file  . Ran Out of Food in the Last Year: Not on file  Transportation Needs:   . Lack of Transportation (Medical): Not on file  . Lack of Transportation (Non-Medical): Not on file  Physical Activity:   . Days of Exercise per Week: Not on file  . Minutes of Exercise per Session: Not on file  Stress:   . Feeling of Stress : Not on file  Social Connections:   . Frequency of Communication with Friends and Family: Not on file  . Frequency of Social Gatherings with Friends and Family: Not on file  . Attends Religious Services: Not on file  . Active Member of Clubs or Organizations: Not on file  . Attends Archivist Meetings: Not on file  . Marital Status: Not on file  Intimate Partner Violence:   . Fear of Current or Ex-Partner: Not on file  . Emotionally Abused: Not on file  . Physically Abused: Not on file  . Sexually Abused: Not on file    Family History  Problem Relation Age of Onset  . Heart disease Father   . Heart disease Mother   . Diabetes Mother   . Hypertension Brother   . Hypertension Brother   . Cancer Neg Hx     ROS: no fevers or  chills, productive cough, hemoptysis, dysphasia, odynophagia, melena, hematochezia, dysuria, hematuria, rash, seizure activity, orthopnea, PND, pedal edema, claudication. Remaining systems are negative.  Physical Exam: Well-developed well-nourished in no acute distress.  Skin is warm and dry.  HEENT is normal.  Neck is supple.  Chest is clear to auscultation with normal expansion.  Cardiovascular exam is regular rate and rhythm.  Abdominal exam nontender or distended. No masses palpated. Extremities show no edema. neuro grossly intact  ECG- personally reviewed  A/P  1 chronic combined systolic/diastolic  congestive heart failure-  2 permanent atrial fibrillation-heart rate is controlled today.  Continue apixaban at present dose.  3 cardiomyopathy-  4 prior ICD-Per electrophysiology.  5 hypertension-blood pressure controlled.  6 PVCs-amiodarone reinitiated.  In 3 months we will check TSH, liver functions and chest x-ray.  Olga Millers, MD

## 2020-02-01 ENCOUNTER — Ambulatory Visit: Payer: Medicare HMO | Admitting: Cardiology

## 2020-02-17 ENCOUNTER — Ambulatory Visit: Payer: Medicare HMO | Admitting: Podiatry

## 2020-03-02 NOTE — Progress Notes (Signed)
No ICM remote transmission received for 03/02/2020.  Patient has transitioned to hospice care and unable to continue monthly ICM follow up.  Patient disenrolled from [facility] clinic.  Device clinic will continue 91 day remote monitoring per protocol.

## 2020-05-16 ENCOUNTER — Other Ambulatory Visit: Payer: Self-pay | Admitting: Cardiology

## 2020-05-17 NOTE — Telephone Encounter (Signed)
Rx request sent to pharmacy.  

## 2020-05-31 ENCOUNTER — Ambulatory Visit: Payer: Medicare HMO

## 2020-06-01 ENCOUNTER — Telehealth: Payer: Self-pay

## 2020-06-01 NOTE — Telephone Encounter (Signed)
Left message for patient to remind of missed remote transmission.  

## 2020-06-01 NOTE — Telephone Encounter (Signed)
The pt daughter states she was under the impression that she do not have to send remote transmissions because the pt is in hospice. I Steward Drone do wants the pt to send transmission. I

## 2020-06-03 LAB — CUP PACEART REMOTE DEVICE CHECK
Battery Remaining Longevity: 37 mo
Battery Voltage: 2.97 V
Brady Statistic AP VP Percent: 0 %
Brady Statistic AP VS Percent: 0 %
Brady Statistic AS VP Percent: 0 %
Brady Statistic AS VS Percent: 0 %
Brady Statistic RA Percent Paced: 0 %
Brady Statistic RV Percent Paced: 88.38 %
Date Time Interrogation Session: 20210625164515
Implantable Lead Implant Date: 20131105
Implantable Lead Implant Date: 20131105
Implantable Lead Implant Date: 20131105
Implantable Lead Location: 753858
Implantable Lead Location: 753859
Implantable Lead Location: 753860
Implantable Lead Model: 4296
Implantable Lead Model: 5076
Implantable Lead Model: 6935
Implantable Pulse Generator Implant Date: 20170818
Lead Channel Impedance Value: 304 Ohm
Lead Channel Impedance Value: 323 Ohm
Lead Channel Impedance Value: 342 Ohm
Lead Channel Impedance Value: 361 Ohm
Lead Channel Impedance Value: 399 Ohm
Lead Channel Impedance Value: 418 Ohm
Lead Channel Impedance Value: 437 Ohm
Lead Channel Impedance Value: 475 Ohm
Lead Channel Impedance Value: 608 Ohm
Lead Channel Pacing Threshold Amplitude: 0.875 V
Lead Channel Pacing Threshold Amplitude: 1.125 V
Lead Channel Pacing Threshold Pulse Width: 0.4 ms
Lead Channel Pacing Threshold Pulse Width: 1.5 ms
Lead Channel Sensing Intrinsic Amplitude: 6.5 mV
Lead Channel Sensing Intrinsic Amplitude: 6.5 mV
Lead Channel Setting Pacing Amplitude: 2 V
Lead Channel Setting Pacing Amplitude: 2.25 V
Lead Channel Setting Pacing Pulse Width: 0.4 ms
Lead Channel Setting Pacing Pulse Width: 1.5 ms
Lead Channel Setting Sensing Sensitivity: 0.9 mV

## 2020-09-14 ENCOUNTER — Telehealth: Payer: Self-pay | Admitting: Family Medicine

## 2020-09-14 NOTE — Telephone Encounter (Signed)
Pt is currently under hospice care. His daughter [name] states he is at her home and the doctors come out 3 times a week.

## 2021-03-10 DEATH — deceased

## 2021-05-24 IMAGING — DX DG CHEST 2V
2 series · 2 of 2 positions shown · non-contrast
Comparison: Chest radiograph dated 12/14/2019.

CLINICAL DATA: 89-year-old male with shortness of breath.

EXAM:
CHEST - 2 VIEW

[chest lat]
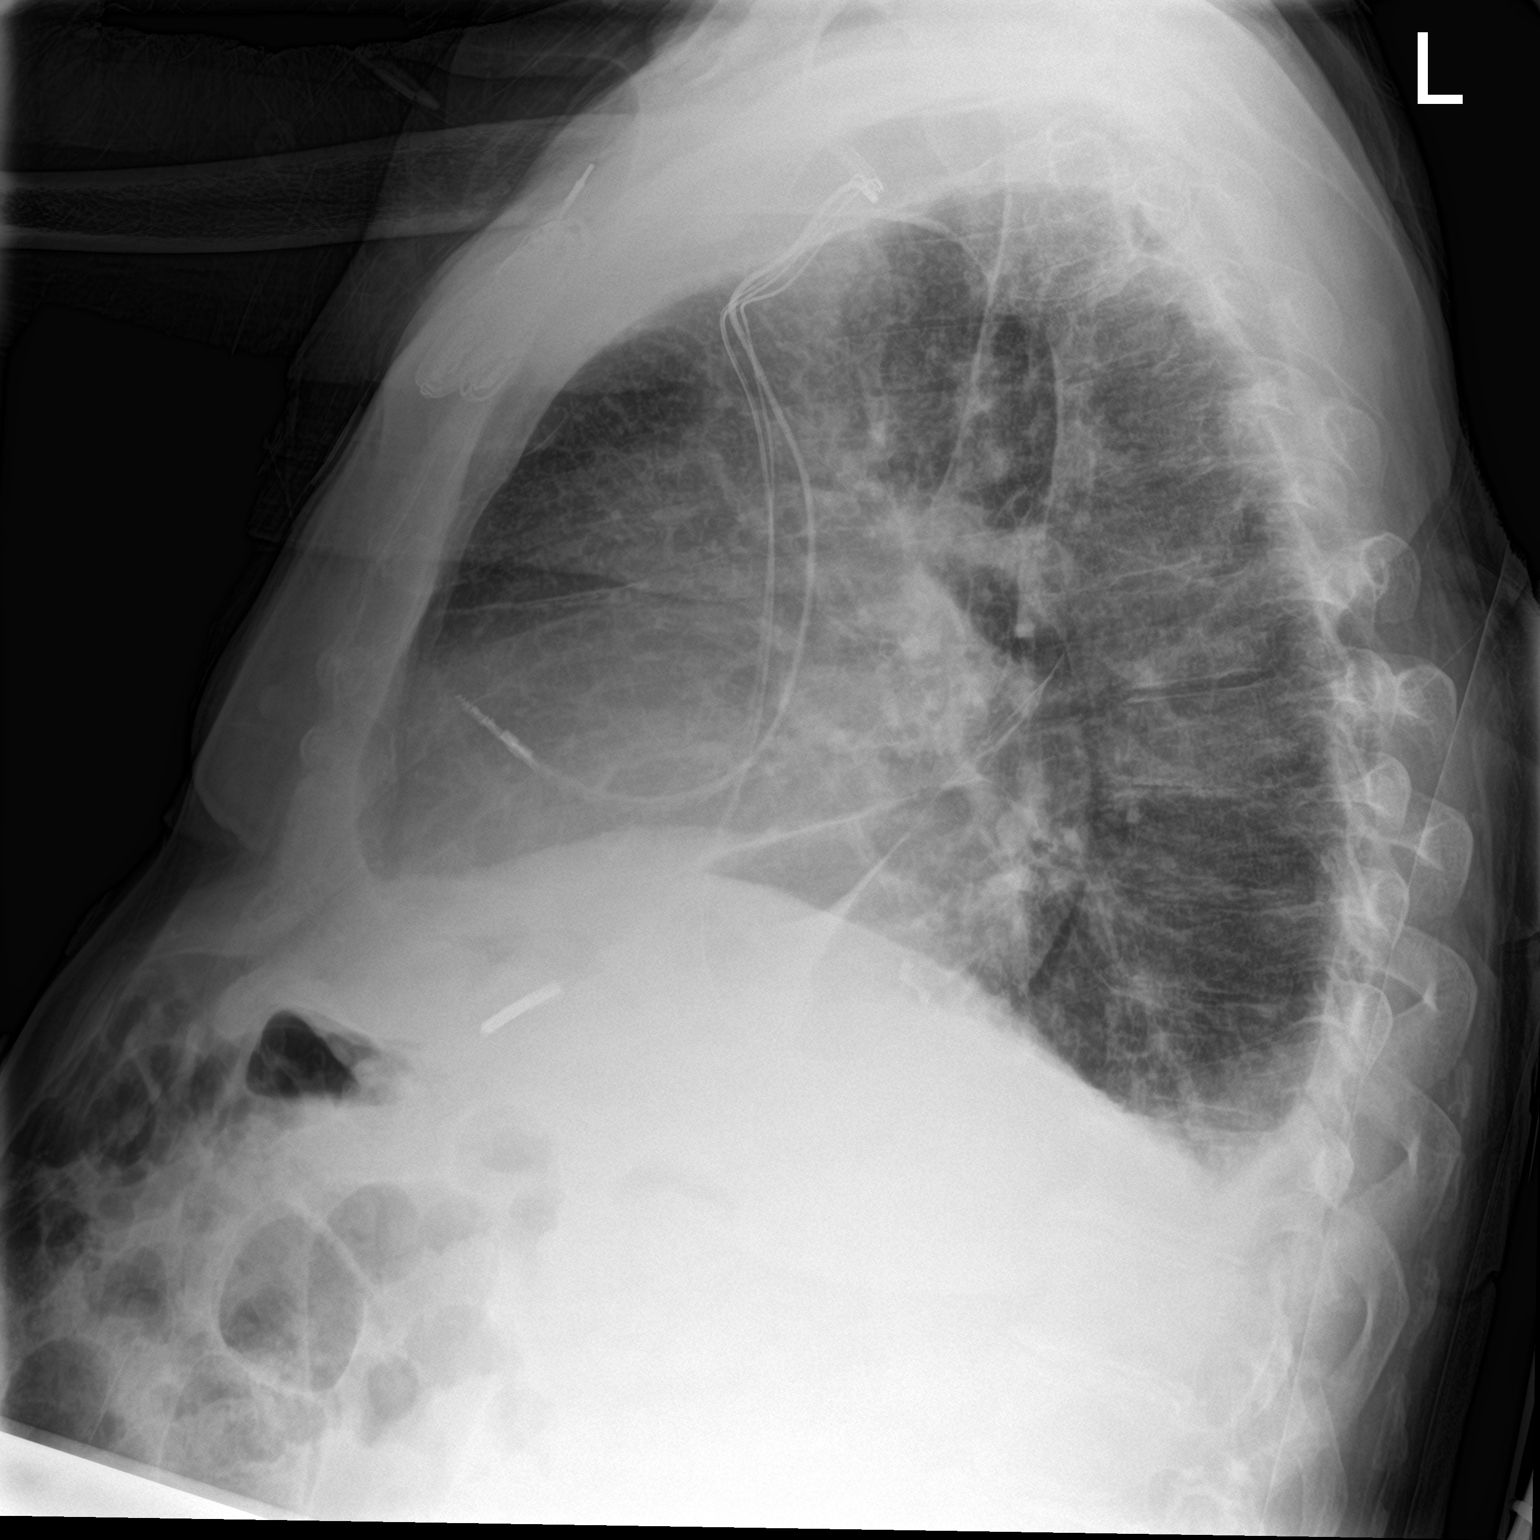

[chest ap]
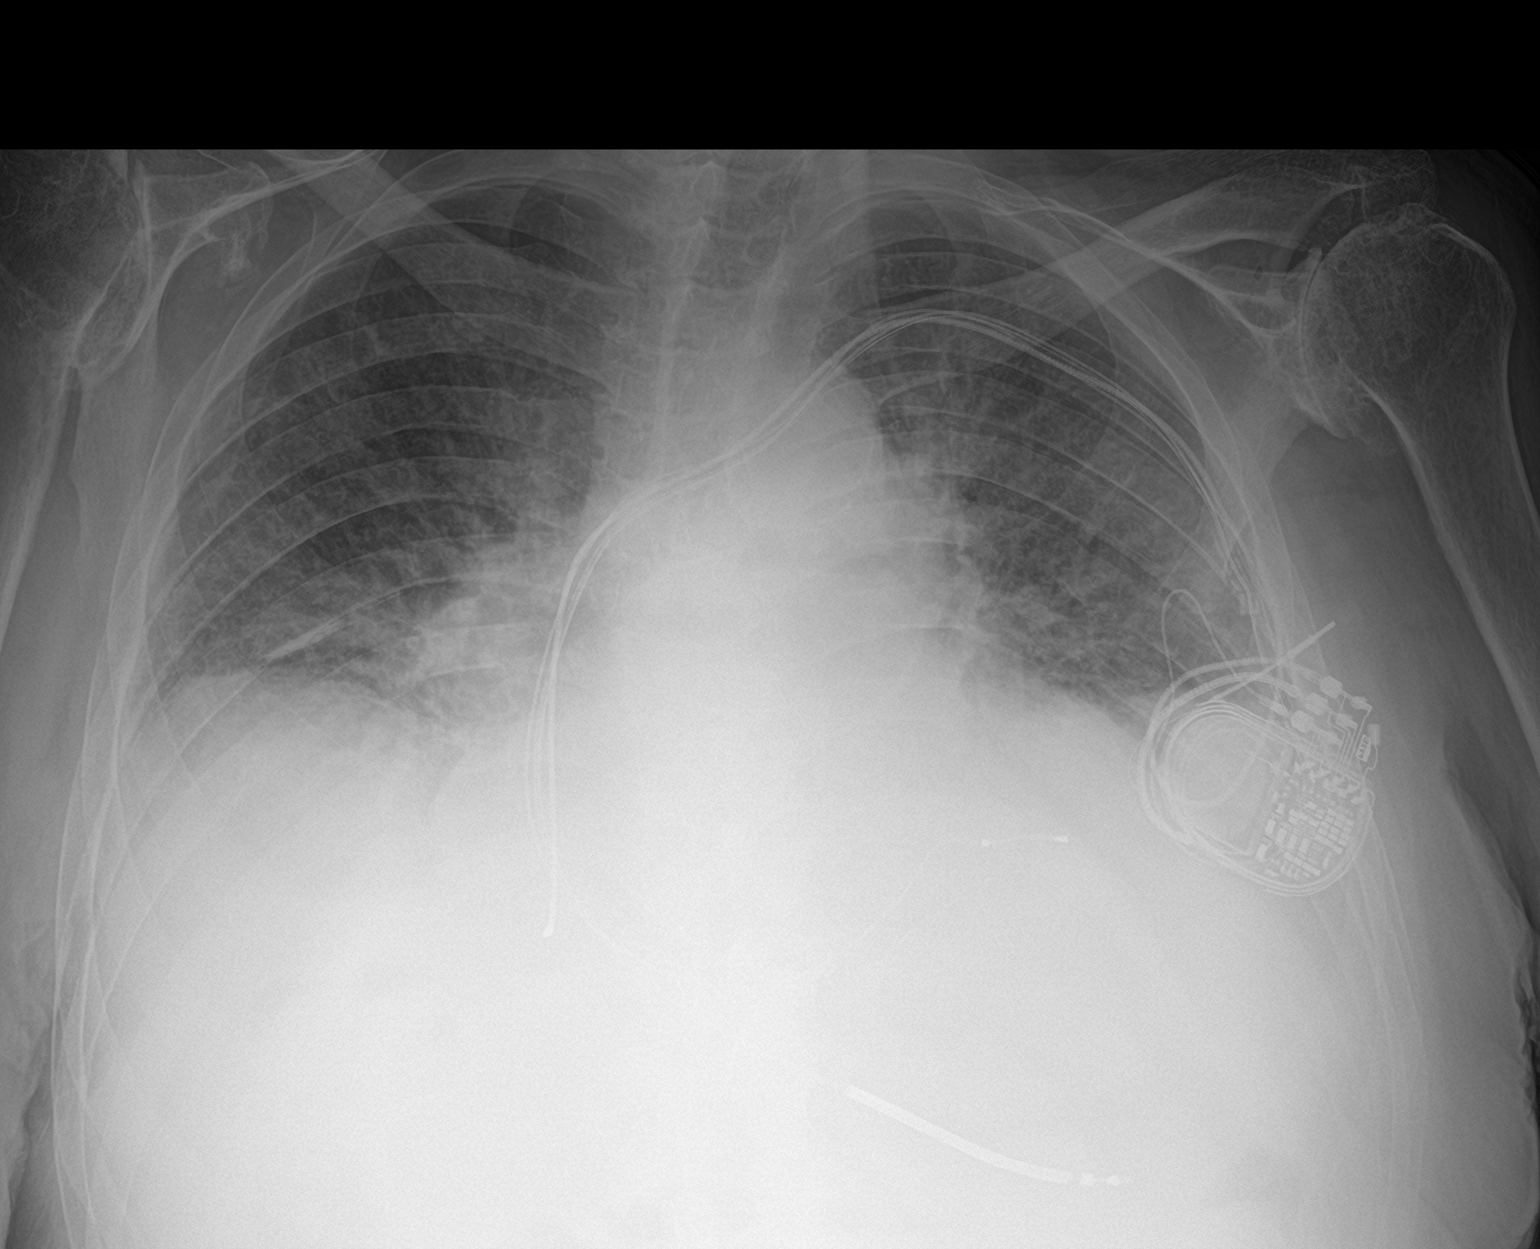

[2 of 2 positions shown; findings below may reference images not displayed]

FINDINGS: Shallow inspiration with bibasilar atelectasis. There is
cardiomegaly with vascular congestion and small bilateral pleural
effusion. Interval improvement of the previously seen faint right
peripheral and subpleural densities. There is no pneumothorax.
Stable cardiomegaly. Left pectoral pacemaker device. No acute
osseous pathology.
IMPRESSION: 1. Overall interval improvement of the previously seen right lung
densities since the prior radiograph.
2. Small bilateral pleural effusions and bibasilar
atelectasis/infiltrate.
3. Stable cardiomegaly.

## 2021-06-01 IMAGING — DX DG CHEST 1V PORT
1 series · 1 of 1 positions shown · non-contrast
Comparison: 12/23/2019

CLINICAL DATA: Shortness of breath.

EXAM:
PORTABLE CHEST 1 VIEW

[chest ap]
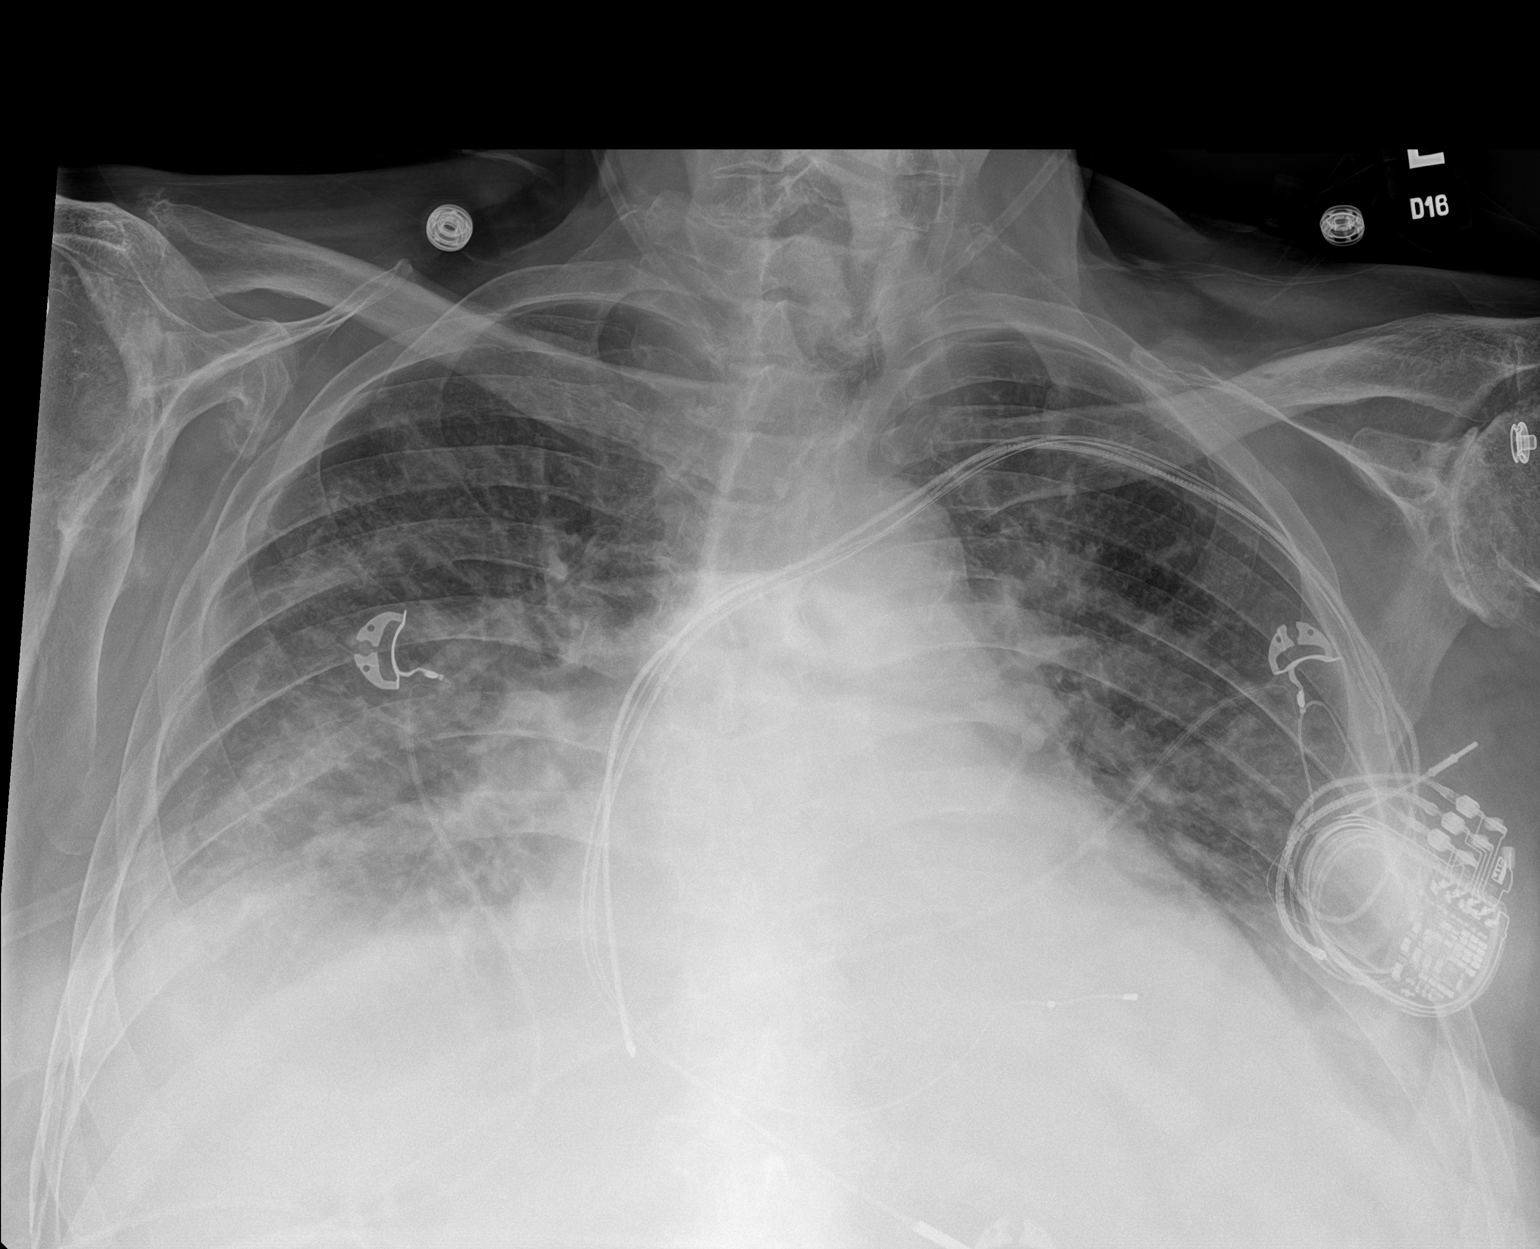

[1 of 1 positions shown; findings below may reference images not displayed]

FINDINGS: 5863 hours. Low lung volumes. The cardio pericardial silhouette is
enlarged. There is pulmonary vascular congestion without overt
pulmonary edema. Bibasilar collapse/consolidation with probable
small bilateral pleural effusions. Left permanent pacemaker again
noted. Bones are diffusely demineralized.
IMPRESSION: 1. Vascular congestion without overt airspace edema.
2. Bibasilar collapse/consolidation, slightly progressed in the
interval.
3. Small bilateral pleural effusions.
